# Patient Record
Sex: Female | Born: 1940 | Race: White | Hispanic: No | Marital: Married | Smoking: Never smoker
Health system: Southern US, Community
[De-identification: ages and names within clinical notes are randomized; demographics above are authoritative.]

## PROBLEM LIST (undated history)

## (undated) DIAGNOSIS — E785 Hyperlipidemia, unspecified: Secondary | ICD-10-CM

## (undated) DIAGNOSIS — M810 Age-related osteoporosis without current pathological fracture: Secondary | ICD-10-CM

## (undated) DIAGNOSIS — K219 Gastro-esophageal reflux disease without esophagitis: Secondary | ICD-10-CM

## (undated) DIAGNOSIS — N952 Postmenopausal atrophic vaginitis: Secondary | ICD-10-CM

## (undated) DIAGNOSIS — G238 Other specified degenerative diseases of basal ganglia: Secondary | ICD-10-CM

## (undated) DIAGNOSIS — G239 Degenerative disease of basal ganglia, unspecified: Principal | ICD-10-CM

## (undated) DIAGNOSIS — N3020 Other chronic cystitis without hematuria: Secondary | ICD-10-CM

## (undated) DIAGNOSIS — J42 Unspecified chronic bronchitis: Secondary | ICD-10-CM

## (undated) DIAGNOSIS — I2510 Atherosclerotic heart disease of native coronary artery without angina pectoris: Secondary | ICD-10-CM

## (undated) DIAGNOSIS — G250 Essential tremor: Secondary | ICD-10-CM

## (undated) DIAGNOSIS — Z8701 Personal history of pneumonia (recurrent): Secondary | ICD-10-CM

## (undated) DIAGNOSIS — K648 Other hemorrhoids: Secondary | ICD-10-CM

## (undated) DIAGNOSIS — N319 Neuromuscular dysfunction of bladder, unspecified: Secondary | ICD-10-CM

## (undated) DIAGNOSIS — F32A Depression, unspecified: Secondary | ICD-10-CM

## (undated) DIAGNOSIS — N1330 Unspecified hydronephrosis: Secondary | ICD-10-CM

## (undated) DIAGNOSIS — F329 Major depressive disorder, single episode, unspecified: Secondary | ICD-10-CM

## (undated) DIAGNOSIS — G903 Multi-system degeneration of the autonomic nervous system: Secondary | ICD-10-CM

## (undated) DIAGNOSIS — E039 Hypothyroidism, unspecified: Secondary | ICD-10-CM

## (undated) HISTORY — DX: Age-related osteoporosis without current pathological fracture: M81.0

## (undated) HISTORY — PX: LAPAROSCOPIC NISSEN FUNDOPLICATION: SHX1932

## (undated) HISTORY — DX: Other hemorrhoids: K64.8

## (undated) HISTORY — DX: Multi-system degeneration of the autonomic nervous system: G90.3

## (undated) HISTORY — DX: Hypothyroidism, unspecified: E03.9

## (undated) HISTORY — DX: Hyperlipidemia, unspecified: E78.5

## (undated) HISTORY — DX: Gastro-esophageal reflux disease without esophagitis: K21.9

## (undated) HISTORY — DX: Unspecified hydronephrosis: N13.30

## (undated) HISTORY — DX: Essential tremor: G25.0

## (undated) HISTORY — DX: Degenerative disease of basal ganglia, unspecified: G23.9

## (undated) HISTORY — DX: Other specified degenerative diseases of basal ganglia: G23.8

## (undated) HISTORY — DX: Personal history of pneumonia (recurrent): Z87.01

## (undated) HISTORY — DX: Atherosclerotic heart disease of native coronary artery without angina pectoris: I25.10

## (undated) HISTORY — DX: Postmenopausal atrophic vaginitis: N95.2

## (undated) HISTORY — DX: Neuromuscular dysfunction of bladder, unspecified: N31.9

## (undated) HISTORY — DX: Major depressive disorder, single episode, unspecified: F32.9

## (undated) HISTORY — PX: TUMOR REMOVAL: SHX12

## (undated) HISTORY — DX: Other chronic cystitis without hematuria: N30.20

## (undated) HISTORY — DX: Depression, unspecified: F32.A

## (undated) HISTORY — PX: TUBAL LIGATION: SHX77

## (undated) HISTORY — PX: CYSTECTOMY: SUR359

## (undated) HISTORY — DX: Unspecified chronic bronchitis: J42

---

## 1999-11-20 ENCOUNTER — Other Ambulatory Visit: Admission: RE | Admit: 1999-11-20 | Discharge: 1999-11-20 | Payer: Self-pay | Admitting: Internal Medicine

## 2000-04-20 ENCOUNTER — Ambulatory Visit (HOSPITAL_COMMUNITY): Admission: RE | Admit: 2000-04-20 | Discharge: 2000-04-20 | Payer: Self-pay | Admitting: Gastroenterology

## 2000-07-05 ENCOUNTER — Encounter: Admission: RE | Admit: 2000-07-05 | Discharge: 2000-07-05 | Payer: Self-pay | Admitting: Internal Medicine

## 2000-07-05 ENCOUNTER — Encounter: Payer: Self-pay | Admitting: Internal Medicine

## 2000-07-06 ENCOUNTER — Encounter: Payer: Self-pay | Admitting: General Surgery

## 2000-07-08 ENCOUNTER — Ambulatory Visit (HOSPITAL_COMMUNITY): Admission: RE | Admit: 2000-07-08 | Discharge: 2000-07-08 | Payer: Self-pay | Admitting: General Surgery

## 2000-08-12 ENCOUNTER — Observation Stay (HOSPITAL_COMMUNITY): Admission: RE | Admit: 2000-08-12 | Discharge: 2000-08-13 | Payer: Self-pay | Admitting: General Surgery

## 2001-06-28 ENCOUNTER — Encounter: Admission: RE | Admit: 2001-06-28 | Discharge: 2001-06-28 | Payer: Self-pay | Admitting: Internal Medicine

## 2001-06-28 ENCOUNTER — Encounter: Payer: Self-pay | Admitting: Internal Medicine

## 2002-01-11 ENCOUNTER — Other Ambulatory Visit: Admission: RE | Admit: 2002-01-11 | Discharge: 2002-01-11 | Payer: Self-pay | Admitting: Internal Medicine

## 2002-08-23 ENCOUNTER — Encounter: Admission: RE | Admit: 2002-08-23 | Discharge: 2002-08-23 | Payer: Self-pay

## 2002-08-23 ENCOUNTER — Encounter: Payer: Self-pay | Admitting: Internal Medicine

## 2003-02-14 ENCOUNTER — Other Ambulatory Visit: Admission: RE | Admit: 2003-02-14 | Discharge: 2003-02-14 | Payer: Self-pay | Admitting: Internal Medicine

## 2003-11-14 ENCOUNTER — Emergency Department (HOSPITAL_COMMUNITY): Admission: EM | Admit: 2003-11-14 | Discharge: 2003-11-14 | Payer: Self-pay | Admitting: *Deleted

## 2004-01-10 ENCOUNTER — Encounter: Admission: RE | Admit: 2004-01-10 | Discharge: 2004-01-10 | Payer: Self-pay | Admitting: Internal Medicine

## 2004-09-17 ENCOUNTER — Other Ambulatory Visit: Admission: RE | Admit: 2004-09-17 | Discharge: 2004-09-17 | Payer: Self-pay | Admitting: Internal Medicine

## 2004-09-17 ENCOUNTER — Ambulatory Visit: Payer: Self-pay | Admitting: Internal Medicine

## 2004-09-29 ENCOUNTER — Encounter: Admission: RE | Admit: 2004-09-29 | Discharge: 2004-09-29 | Payer: Self-pay | Admitting: Internal Medicine

## 2004-10-08 ENCOUNTER — Ambulatory Visit: Payer: Self-pay | Admitting: Internal Medicine

## 2004-12-30 ENCOUNTER — Ambulatory Visit: Payer: Self-pay | Admitting: Internal Medicine

## 2005-01-20 ENCOUNTER — Ambulatory Visit: Payer: Self-pay | Admitting: Internal Medicine

## 2005-02-10 ENCOUNTER — Ambulatory Visit: Payer: Self-pay | Admitting: Gastroenterology

## 2005-05-13 ENCOUNTER — Encounter: Admission: RE | Admit: 2005-05-13 | Discharge: 2005-05-13 | Payer: Self-pay | Admitting: Internal Medicine

## 2005-08-25 ENCOUNTER — Ambulatory Visit: Payer: Self-pay | Admitting: Internal Medicine

## 2005-12-30 ENCOUNTER — Ambulatory Visit: Payer: Self-pay | Admitting: Family Medicine

## 2006-02-08 ENCOUNTER — Encounter (INDEPENDENT_AMBULATORY_CARE_PROVIDER_SITE_OTHER): Payer: Self-pay | Admitting: *Deleted

## 2006-02-10 ENCOUNTER — Ambulatory Visit: Payer: Self-pay | Admitting: Internal Medicine

## 2006-02-17 ENCOUNTER — Other Ambulatory Visit: Admission: RE | Admit: 2006-02-17 | Discharge: 2006-02-17 | Payer: Self-pay | Admitting: Internal Medicine

## 2006-02-17 ENCOUNTER — Encounter: Payer: Self-pay | Admitting: Internal Medicine

## 2006-02-17 ENCOUNTER — Ambulatory Visit: Payer: Self-pay | Admitting: Internal Medicine

## 2006-03-31 ENCOUNTER — Ambulatory Visit: Payer: Self-pay | Admitting: Family Medicine

## 2006-06-08 ENCOUNTER — Ambulatory Visit: Payer: Self-pay | Admitting: Family Medicine

## 2006-06-08 ENCOUNTER — Encounter: Admission: RE | Admit: 2006-06-08 | Discharge: 2006-06-08 | Payer: Self-pay | Admitting: Internal Medicine

## 2007-01-04 ENCOUNTER — Ambulatory Visit: Payer: Self-pay | Admitting: Internal Medicine

## 2007-03-04 ENCOUNTER — Ambulatory Visit: Payer: Self-pay | Admitting: Internal Medicine

## 2007-03-04 LAB — CONVERTED CEMR LAB
ALT: 18 units/L (ref 0–40)
Albumin: 3.8 g/dL (ref 3.5–5.2)
Alkaline Phosphatase: 98 units/L (ref 39–117)
BUN: 14 mg/dL (ref 6–23)
Basophils Relative: 0.7 % (ref 0.0–1.0)
CO2: 32 meq/L (ref 19–32)
Glucose, Bld: 110 mg/dL — ABNORMAL HIGH (ref 70–99)
HDL: 44.9 mg/dL (ref >39.0)
LDL Cholesterol: 120 mg/dL — ABNORMAL HIGH (ref 0–99)
Lymphocytes Relative: 38.9 % (ref 12.0–46.0)
Monocytes Relative: 10 % (ref 3.0–11.0)
Neutro Abs: 2.7 10*3/uL (ref 1.4–7.7)
Platelets: 273 10*3/uL (ref 150–400)
Potassium: 4 meq/L (ref 3.5–5.1)
RBC: 4.48 M/uL (ref 3.87–5.11)
Total Bilirubin: 0.7 mg/dL (ref 0.3–1.2)
Total CHOL/HDL Ratio: 4.2
Total Protein: 6.3 g/dL (ref 6.0–8.3)
Triglycerides: 126 mg/dL (ref 0–149)
VLDL: 25 mg/dL (ref 0–40)

## 2007-03-21 ENCOUNTER — Ambulatory Visit: Payer: Self-pay

## 2007-04-06 ENCOUNTER — Ambulatory Visit: Payer: Self-pay | Admitting: Cardiology

## 2007-04-07 ENCOUNTER — Ambulatory Visit: Payer: Self-pay | Admitting: Cardiology

## 2007-04-07 LAB — CONVERTED CEMR LAB
Basophils Relative: 0.5 % (ref 0.0–1.0)
CO2: 29 meq/L (ref 19–32)
Eosinophils Relative: 2.2 % (ref 0.0–5.0)
GFR calc Af Amer: 205 mL/min
Glucose, Bld: 115 mg/dL — ABNORMAL HIGH (ref 70–99)
Hemoglobin: 12.9 g/dL (ref 12.0–15.0)
Lymphocytes Relative: 34.7 % (ref 12.0–46.0)
Monocytes Absolute: 0.5 10*3/uL (ref 0.2–0.7)
Neutro Abs: 3 10*3/uL (ref 1.4–7.7)
Neutrophils Relative %: 52.9 % (ref 43.0–77.0)
Potassium: 3.9 meq/L (ref 3.5–5.1)
Prothrombin Time: 11.6 s (ref 10.0–14.0)
Sodium: 143 meq/L (ref 135–145)
WBC: 5.5 10*3/uL (ref 4.5–10.5)

## 2007-04-08 ENCOUNTER — Ambulatory Visit: Payer: Self-pay | Admitting: Cardiology

## 2007-04-08 ENCOUNTER — Inpatient Hospital Stay (HOSPITAL_BASED_OUTPATIENT_CLINIC_OR_DEPARTMENT_OTHER): Admission: RE | Admit: 2007-04-08 | Discharge: 2007-04-08 | Payer: Self-pay | Admitting: Cardiology

## 2007-04-12 ENCOUNTER — Ambulatory Visit: Payer: Self-pay | Admitting: Cardiology

## 2007-04-21 ENCOUNTER — Ambulatory Visit: Payer: Self-pay | Admitting: Internal Medicine

## 2007-06-02 ENCOUNTER — Ambulatory Visit: Payer: Self-pay | Admitting: Cardiology

## 2007-06-08 ENCOUNTER — Ambulatory Visit: Payer: Self-pay | Admitting: Cardiology

## 2007-06-08 LAB — CONVERTED CEMR LAB
ALT: 20 units/L (ref 0–35)
Alkaline Phosphatase: 86 units/L (ref 39–117)
Bilirubin, Direct: 0.1 mg/dL (ref 0.0–0.3)
HDL: 35.2 mg/dL — ABNORMAL LOW (ref >39.0)
LDL Cholesterol: 45 mg/dL (ref 0–99)
VLDL: 11 mg/dL (ref 0–40)

## 2007-07-13 ENCOUNTER — Ambulatory Visit: Payer: Self-pay | Admitting: Cardiology

## 2007-07-19 ENCOUNTER — Encounter: Admission: RE | Admit: 2007-07-19 | Discharge: 2007-07-19 | Payer: Self-pay | Admitting: Internal Medicine

## 2007-09-08 ENCOUNTER — Encounter: Payer: Self-pay | Admitting: Internal Medicine

## 2007-09-08 ENCOUNTER — Ambulatory Visit: Payer: Self-pay | Admitting: Internal Medicine

## 2007-11-07 ENCOUNTER — Encounter (INDEPENDENT_AMBULATORY_CARE_PROVIDER_SITE_OTHER): Payer: Self-pay | Admitting: *Deleted

## 2007-11-07 DIAGNOSIS — K219 Gastro-esophageal reflux disease without esophagitis: Secondary | ICD-10-CM

## 2007-11-07 DIAGNOSIS — R251 Tremor, unspecified: Secondary | ICD-10-CM

## 2007-11-07 DIAGNOSIS — J42 Unspecified chronic bronchitis: Secondary | ICD-10-CM | POA: Insufficient documentation

## 2007-11-07 DIAGNOSIS — N952 Postmenopausal atrophic vaginitis: Secondary | ICD-10-CM

## 2007-11-07 DIAGNOSIS — J189 Pneumonia, unspecified organism: Secondary | ICD-10-CM

## 2007-11-07 DIAGNOSIS — N3289 Other specified disorders of bladder: Secondary | ICD-10-CM | POA: Insufficient documentation

## 2007-11-07 DIAGNOSIS — F329 Major depressive disorder, single episode, unspecified: Secondary | ICD-10-CM

## 2007-11-07 DIAGNOSIS — G969 Disorder of central nervous system, unspecified: Secondary | ICD-10-CM

## 2007-11-07 HISTORY — DX: Postmenopausal atrophic vaginitis: N95.2

## 2007-11-15 ENCOUNTER — Ambulatory Visit: Payer: Self-pay | Admitting: Cardiology

## 2008-01-31 ENCOUNTER — Ambulatory Visit: Payer: Self-pay | Admitting: Internal Medicine

## 2008-01-31 DIAGNOSIS — D485 Neoplasm of uncertain behavior of skin: Secondary | ICD-10-CM | POA: Insufficient documentation

## 2008-02-13 ENCOUNTER — Encounter: Payer: Self-pay | Admitting: Internal Medicine

## 2008-02-14 ENCOUNTER — Ambulatory Visit: Payer: Self-pay | Admitting: Internal Medicine

## 2008-02-14 DIAGNOSIS — I8390 Asymptomatic varicose veins of unspecified lower extremity: Secondary | ICD-10-CM | POA: Insufficient documentation

## 2008-02-22 ENCOUNTER — Ambulatory Visit: Payer: Self-pay | Admitting: Gastroenterology

## 2008-02-28 ENCOUNTER — Encounter: Payer: Self-pay | Admitting: Internal Medicine

## 2008-03-09 ENCOUNTER — Ambulatory Visit (HOSPITAL_BASED_OUTPATIENT_CLINIC_OR_DEPARTMENT_OTHER): Admission: RE | Admit: 2008-03-09 | Discharge: 2008-03-09 | Payer: Self-pay | Admitting: Urology

## 2008-03-10 ENCOUNTER — Emergency Department (HOSPITAL_COMMUNITY): Admission: EM | Admit: 2008-03-10 | Discharge: 2008-03-10 | Payer: Self-pay | Admitting: Emergency Medicine

## 2008-03-15 ENCOUNTER — Ambulatory Visit: Payer: Self-pay | Admitting: Internal Medicine

## 2008-04-10 ENCOUNTER — Encounter: Payer: Self-pay | Admitting: Internal Medicine

## 2008-05-10 ENCOUNTER — Ambulatory Visit: Payer: Self-pay | Admitting: Internal Medicine

## 2008-05-10 LAB — CONVERTED CEMR LAB
Basophils Absolute: 0 10*3/uL (ref 0.0–0.1)
Bilirubin Urine: NEGATIVE
Bilirubin, Direct: 0.1 mg/dL (ref 0.0–0.3)
Calcium: 9.6 mg/dL (ref 8.4–10.5)
Eosinophils Absolute: 0.1 10*3/uL (ref 0.0–0.7)
GFR calc Af Amer: 107 mL/min
HCT: 38 % (ref 36.0–46.0)
HDL: 49.9 mg/dL (ref >39.0)
Hemoglobin: 13 g/dL (ref 12.0–15.0)
Ketones, ur: NEGATIVE mg/dL
Leukocytes, UA: NEGATIVE
Lymphocytes Relative: 29.8 % (ref 12.0–46.0)
MCHC: 34.3 g/dL (ref 30.0–36.0)
MCV: 90.1 fL (ref 78.0–100.0)
Monocytes Absolute: 0.5 10*3/uL (ref 0.1–1.0)
Neutro Abs: 2.8 10*3/uL (ref 1.4–7.7)
RDW: 12.2 % (ref 11.5–14.6)
Sodium: 146 meq/L — ABNORMAL HIGH (ref 135–145)
TSH: 0.44 microintl units/mL (ref 0.35–5.50)
Total Bilirubin: 0.6 mg/dL (ref 0.3–1.2)
Total CHOL/HDL Ratio: 2.3
Triglycerides: 56 mg/dL (ref 0–149)
pH: 7 (ref 5.0–8.0)

## 2008-05-15 ENCOUNTER — Other Ambulatory Visit: Admission: RE | Admit: 2008-05-15 | Discharge: 2008-05-15 | Payer: Self-pay | Admitting: Internal Medicine

## 2008-05-15 ENCOUNTER — Ambulatory Visit: Payer: Self-pay | Admitting: Internal Medicine

## 2008-05-15 ENCOUNTER — Encounter: Payer: Self-pay | Admitting: Internal Medicine

## 2008-05-15 DIAGNOSIS — E039 Hypothyroidism, unspecified: Secondary | ICD-10-CM

## 2008-05-15 DIAGNOSIS — J45909 Unspecified asthma, uncomplicated: Secondary | ICD-10-CM | POA: Insufficient documentation

## 2008-05-17 ENCOUNTER — Ambulatory Visit: Payer: Self-pay | Admitting: Cardiology

## 2008-05-18 ENCOUNTER — Telehealth: Payer: Self-pay | Admitting: Internal Medicine

## 2008-05-22 ENCOUNTER — Encounter: Payer: Self-pay | Admitting: Internal Medicine

## 2008-05-24 ENCOUNTER — Ambulatory Visit: Payer: Self-pay | Admitting: Internal Medicine

## 2008-06-01 ENCOUNTER — Ambulatory Visit: Payer: Self-pay | Admitting: Internal Medicine

## 2008-06-04 ENCOUNTER — Ambulatory Visit: Payer: Self-pay | Admitting: Internal Medicine

## 2008-06-04 DIAGNOSIS — R0602 Shortness of breath: Secondary | ICD-10-CM | POA: Insufficient documentation

## 2008-06-09 DIAGNOSIS — I2510 Atherosclerotic heart disease of native coronary artery without angina pectoris: Secondary | ICD-10-CM | POA: Insufficient documentation

## 2008-07-05 ENCOUNTER — Encounter: Payer: Self-pay | Admitting: Internal Medicine

## 2008-07-09 ENCOUNTER — Ambulatory Visit: Payer: Self-pay | Admitting: Internal Medicine

## 2008-08-08 ENCOUNTER — Ambulatory Visit: Payer: Self-pay | Admitting: Internal Medicine

## 2008-11-20 ENCOUNTER — Telehealth: Payer: Self-pay | Admitting: Internal Medicine

## 2008-11-20 ENCOUNTER — Ambulatory Visit: Payer: Self-pay | Admitting: Cardiology

## 2009-01-29 ENCOUNTER — Ambulatory Visit: Payer: Self-pay | Admitting: Internal Medicine

## 2009-03-08 ENCOUNTER — Ambulatory Visit: Payer: Self-pay | Admitting: Internal Medicine

## 2009-04-11 ENCOUNTER — Ambulatory Visit: Payer: Self-pay | Admitting: Internal Medicine

## 2009-05-29 ENCOUNTER — Encounter: Payer: Self-pay | Admitting: Cardiology

## 2009-06-04 ENCOUNTER — Ambulatory Visit: Payer: Self-pay | Admitting: Internal Medicine

## 2009-06-06 ENCOUNTER — Encounter: Admission: RE | Admit: 2009-06-06 | Discharge: 2009-06-06 | Payer: Self-pay | Admitting: Internal Medicine

## 2009-06-06 LAB — CONVERTED CEMR LAB
ALT: 16 units/L (ref 0–35)
AST: 23 units/L (ref 0–37)
BUN: 15 mg/dL (ref 6–23)
Basophils Absolute: 0.3 10*3/uL — ABNORMAL HIGH (ref 0.0–0.1)
Bilirubin, Direct: 0.1 mg/dL (ref 0.0–0.3)
Calcium: 9.2 mg/dL (ref 8.4–10.5)
Cholesterol: 112 mg/dL (ref 0–200)
Eosinophils Relative: 1.3 % (ref 0.0–5.0)
GFR calc non Af Amer: 65.97 mL/min (ref >60)
HCT: 41.2 % (ref 36.0–46.0)
HDL: 48.3 mg/dL (ref >39.00)
Lymphocytes Relative: 32.3 % (ref 12.0–46.0)
Lymphs Abs: 1.5 10*3/uL (ref 0.7–4.0)
Monocytes Relative: 8.8 % (ref 3.0–12.0)
Platelets: 224 10*3/uL (ref 150.0–400.0)
Potassium: 4.6 meq/L (ref 3.5–5.1)
RDW: 12.6 % (ref 11.5–14.6)
Sodium: 141 meq/L (ref 135–145)
TSH: 0.47 microintl units/mL (ref 0.35–5.50)
Total Protein: 6.7 g/dL (ref 6.0–8.3)
VLDL: 14.4 mg/dL (ref 0.0–40.0)
WBC: 4.6 10*3/uL (ref 4.5–10.5)

## 2009-06-07 ENCOUNTER — Telehealth: Payer: Self-pay | Admitting: Internal Medicine

## 2009-06-10 ENCOUNTER — Telehealth: Payer: Self-pay | Admitting: Internal Medicine

## 2009-06-10 ENCOUNTER — Ambulatory Visit: Payer: Self-pay | Admitting: Internal Medicine

## 2009-06-21 ENCOUNTER — Ambulatory Visit: Payer: Self-pay | Admitting: Internal Medicine

## 2009-06-21 DIAGNOSIS — I872 Venous insufficiency (chronic) (peripheral): Secondary | ICD-10-CM | POA: Insufficient documentation

## 2009-07-08 ENCOUNTER — Telehealth: Payer: Self-pay | Admitting: Internal Medicine

## 2009-08-15 ENCOUNTER — Ambulatory Visit: Payer: Self-pay | Admitting: Internal Medicine

## 2010-01-20 ENCOUNTER — Telehealth: Payer: Self-pay | Admitting: Internal Medicine

## 2010-01-27 ENCOUNTER — Ambulatory Visit: Payer: Self-pay | Admitting: Internal Medicine

## 2010-02-06 ENCOUNTER — Telehealth: Payer: Self-pay | Admitting: Internal Medicine

## 2010-02-18 ENCOUNTER — Encounter (INDEPENDENT_AMBULATORY_CARE_PROVIDER_SITE_OTHER): Payer: Self-pay | Admitting: *Deleted

## 2010-04-08 ENCOUNTER — Encounter: Payer: Self-pay | Admitting: Cardiology

## 2010-04-10 ENCOUNTER — Encounter: Payer: Self-pay | Admitting: Internal Medicine

## 2010-04-22 ENCOUNTER — Encounter: Payer: Self-pay | Admitting: Cardiology

## 2010-04-22 ENCOUNTER — Telehealth: Payer: Self-pay | Admitting: Cardiology

## 2010-05-05 ENCOUNTER — Ambulatory Visit: Payer: Self-pay | Admitting: Cardiology

## 2010-05-06 LAB — CONVERTED CEMR LAB: Total CK: 65 units/L (ref 7–177)

## 2010-05-14 ENCOUNTER — Ambulatory Visit: Payer: Self-pay | Admitting: Cardiology

## 2010-05-14 ENCOUNTER — Ambulatory Visit: Payer: Self-pay | Admitting: Internal Medicine

## 2010-05-14 DIAGNOSIS — E7800 Pure hypercholesterolemia, unspecified: Secondary | ICD-10-CM | POA: Insufficient documentation

## 2010-05-14 DIAGNOSIS — J40 Bronchitis, not specified as acute or chronic: Secondary | ICD-10-CM

## 2010-05-15 ENCOUNTER — Telehealth: Payer: Self-pay | Admitting: Internal Medicine

## 2010-06-09 ENCOUNTER — Ambulatory Visit: Payer: Self-pay | Admitting: Internal Medicine

## 2010-06-09 DIAGNOSIS — R5381 Other malaise: Secondary | ICD-10-CM | POA: Insufficient documentation

## 2010-06-09 DIAGNOSIS — R5383 Other fatigue: Secondary | ICD-10-CM

## 2010-06-09 LAB — CONVERTED CEMR LAB
Basophils Absolute: 0 10*3/uL (ref 0.0–0.1)
Bilirubin, Direct: 0.1 mg/dL (ref 0.0–0.3)
CO2: 31 meq/L (ref 19–32)
Calcium: 9.3 mg/dL (ref 8.4–10.5)
Cholesterol: 233 mg/dL — ABNORMAL HIGH (ref 0–200)
Creatinine, Ser: 0.7 mg/dL (ref 0.4–1.2)
Direct LDL: 161.5 mg/dL
Eosinophils Absolute: 0.1 10*3/uL (ref 0.0–0.7)
HCT: 39.5 % (ref 36.0–46.0)
HDL: 51.1 mg/dL (ref >39.00)
Hemoglobin: 13.5 g/dL (ref 12.0–15.0)
Lymphs Abs: 1.8 10*3/uL (ref 0.7–4.0)
MCHC: 34.1 g/dL (ref 30.0–36.0)
MCV: 89.8 fL (ref 78.0–100.0)
Monocytes Relative: 9 % (ref 3.0–12.0)
Neutro Abs: 3.1 10*3/uL (ref 1.4–7.7)
RDW: 13.8 % (ref 11.5–14.6)
Triglycerides: 201 mg/dL — ABNORMAL HIGH (ref 0.0–149.0)
VLDL: 40.2 mg/dL — ABNORMAL HIGH (ref 0.0–40.0)

## 2010-06-13 ENCOUNTER — Encounter: Admission: RE | Admit: 2010-06-13 | Discharge: 2010-06-13 | Payer: Self-pay | Admitting: Internal Medicine

## 2010-07-21 ENCOUNTER — Ambulatory Visit: Payer: Self-pay | Admitting: Cardiology

## 2010-07-28 ENCOUNTER — Telehealth: Payer: Self-pay | Admitting: Cardiology

## 2010-08-08 ENCOUNTER — Telehealth: Payer: Self-pay | Admitting: Internal Medicine

## 2010-08-21 ENCOUNTER — Encounter: Admission: RE | Admit: 2010-08-21 | Discharge: 2010-08-21 | Payer: Self-pay | Admitting: Neurology

## 2010-09-05 ENCOUNTER — Encounter: Admission: RE | Admit: 2010-09-05 | Discharge: 2010-09-05 | Payer: Self-pay | Admitting: Neurology

## 2010-10-09 ENCOUNTER — Telehealth: Payer: Self-pay | Admitting: Internal Medicine

## 2010-10-09 ENCOUNTER — Encounter: Payer: Self-pay | Admitting: Internal Medicine

## 2010-10-16 ENCOUNTER — Encounter
Admission: RE | Admit: 2010-10-16 | Discharge: 2010-10-16 | Payer: Self-pay | Source: Home / Self Care | Attending: Neurology | Admitting: Neurology

## 2010-10-24 ENCOUNTER — Encounter (INDEPENDENT_AMBULATORY_CARE_PROVIDER_SITE_OTHER): Payer: Self-pay | Admitting: *Deleted

## 2010-10-30 ENCOUNTER — Other Ambulatory Visit
Admission: RE | Admit: 2010-10-30 | Discharge: 2010-10-30 | Payer: Self-pay | Source: Home / Self Care | Admitting: Radiology

## 2010-10-30 ENCOUNTER — Encounter
Admission: RE | Admit: 2010-10-30 | Discharge: 2010-10-30 | Payer: Self-pay | Source: Home / Self Care | Attending: Neurology | Admitting: Neurology

## 2010-12-09 NOTE — Assessment & Plan Note (Addendum)
Summary: F1Y   Visit Type:  1 year follow up Referring Provider:  Norins Primary Provider:  Norins  CC:  Bilateral leg pain- Crestor on hold.  History of Present Illness: [name] is in for a follow up visit, and she has seen Dr. Erling Cruz.  She has had a worsening of her tremors, and she noted this. She also had some proximal muscle weaknress, and had EMG/NCV done.  Dr. Erling Cruz called.   He thought it could be related to her statin, and we called her to recommend that this be discontinued.  She has placed Crestor on hold.  CPK was obtained, and was normal.  She has known CAD, with prior recommendation of guideline driven care regarding secondary prevention of acute coronary events.  She has been stable from a cardiac standpoint.  She has yet to notice much difference with discontinuation of her Crestor.  She does have a URI, and has started to get head congestion, and some asthmatic symptoms.  She has perhaps some aching in her legs, but mostly it is the tremor which is worse.   Current Medications (verified): 1)  Clonazepam 0.5 Mg Tabs (Clonazepam) .... Take 1-2  By Mouth Daily 2)  Propranolol Hcl 10 Mg Tabs (Propranolol Hcl) .... Take 1 Tablet By Mouth Three Times A Day 3)  Synthroid 100 Mcg Tabs (Levothyroxine Sodium) .... Take 1 Tablet By Mouth Once A Day 4)  Isosorbide Mononitrate Cr 30 Mg  Tb24 (Isosorbide Mononitrate) .... Take 1/2 Tablet Once Daily 5)  Crestor 20 Mg  Tabs (Rosuvastatin Calcium) .... Take One Tablet Once Daily--On Hold 6)  Nitroquick 0.4 Mg  Subl (Nitroglycerin) .... Take Sl As Needed and As Directed. 7)  Nasonex 50 Mcg/act  Susp (Mometasone Furoate) .... Take As Needed and As Directed. 8)  Trimethoprim 100 Mg  Tabs (Trimethoprim) .... Take One Tablet Once Daily 9)  Qvar 80 Mcg/act Aers (Beclomethasone Dipropionate) .... 2 Puffs and Rinse, Twice Daily 10)  Proair Hfa 108 (90 Base) Mcg/act Aers (Albuterol Sulfate) .... 2 Puffs Four Times Per Day As Needed For Shortness of  Breath 11)  Sertraline Hcl 25 Mg Tabs (Sertraline Hcl) .Marland Kitchen.. 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   Vital Signs:  Patient profile:   70 year old female Height:      62 inches Weight:      147.50 pounds BMI:     27.08 Pulse rate:   68 / minute Pulse rhythm:   regular Resp:     18 per minute BP sitting:   124 / 70  (left arm) Cuff size:   large  Vitals Entered By: Sidney Ace (May 14, 2010 2:36 PM)  Physical Exam  General:  Well developed, well nourished, in no acute distress. Head:  normocephalic and atraumatic Eyes:  PERRLA/EOM intact; conjunctiva and lids normal. Lungs:  Clear bilaterally to auscultation and percussion.  Minimal wheezes Heart:  Non-displaced PMI, chest non-tender; regular rate and rhythm, S1, S2 without murmurs, rubs or gallops. Carotid upstroke normal, no bruit. Normal abdominal aortic size, no bruits.  Pulses:  pulses normal in all 4 extremities Extremities:  No  clubbing or cyanosis. Neurologic:  Alert and oriented x 3.  Known tremor, notable on observation.   Cardiac Cath  Procedure date:  04/08/2007  Findings:       CONCLUSION:   1. Well-preserved left ventricular function.   2. A 30% narrowing of proximal left anterior descending artery and 60-       70% narrowing of the mid left anterior descending artery.   3. Calcification of the right coronary ostium without critical       narrowing in the small nondominant vessel.      DISPOSITION:  The patient does not have a significant perfusion defect   by myocardial perfusion imaging.  She does in fact have mild ST   depression with exercise.  We will attempt to begin an initial program   of medical therapy, and then subsequent to that defer percutaneous   intervention unless she has further symptoms.  Importantly, the LAD does   involve two diagonal branches which are very small in caliber, and the   LAD may or may not be big enough for a drug-eluting stent.  These will   need to be considered.      EKG  Procedure date:  05/14/2010  Findings:      NSR.  WNL  Impression & Recommendations:  Problem # 1:  CORONARY HEART DISEASE (ICD-414.00) Continues to remain stable on a medical regimen.  See cath report.  Goal has been to continue on this path.  Patient and husband have been well educated on signs of progressive symptoms, and also on Courage strategy.  Of note, propranolol prescription is related to her tremor by neurology. The following medications were removed from the medication list:    Aspirin 81 Mg Tabs (Aspirin) .Marland Kitchen... Take one tablet every 3-4 days. Her updated medication list for this problem includes:    Propranolol Hcl 10 Mg Tabs (Propranolol hcl) .Marland Kitchen... Take 1 tablet by mouth three times a day    Isosorbide Mononitrate Cr 30 Mg Tb24 (Isosorbide mononitrate) .Marland Kitchen... Take 1/2 tablet once daily    Nitroquick 0.4 Mg Subl (Nitroglycerin) .Marland Kitchen... Take sl as needed and as  directed.  Orders: EKG w/ Interpretation (93000)  Problem # 2:  HYPERCHOLESTEROLEMIA  IIA (ICD-272.0) We have placed this medication on hold, at the request of neurology, and will see her path as this remains on hold relative to her symptoms.  Notably, it is taken predominately for secondary prevention, and has been monitored by Dr. Linda Hedges.  Will have her back in two months to assess. Her updated medication list for this problem includes:    Crestor 20 Mg Tabs (Rosuvastatin calcium) .Marland Kitchen... Take one tablet once daily--on hold  Patient Instructions: 1)  Your physician recommends that you schedule a follow-up appointment in: 2 MONTHS 2)  Your physician recommends that you continue on your current medications as directed. Please refer to the Current Medication list given to you today.

## 2010-12-09 NOTE — Progress Notes (Signed)
Summary: referral request  Phone Note Call from Patient Call back at Home Phone [phone]   Summary of Call: Patient left message on triage that she was previously seen by Dr. Erling Cruz and she tried to get an appt with them. Patient has not been seen there in 3+ yrs and requires referral. Please enter referral, patient c/o worsening tremors. Please advise. Initial call taken by: Ernestene Mention,  February 06, 2010 3:08 PM  Follow-up for Phone Call        [facility] notified Follow-up by: Neena Rhymes MD,  February 06, 2010 3:56 PM  Additional Follow-up for Phone Call Additional follow up Details #1::        [facility] to notify/ closing phone note Additional Follow-up by: Estell Harpin CMA,  February 06, 2010 4:32 PM

## 2010-12-09 NOTE — Progress Notes (Signed)
Summary: clonazepam  Phone Note Refill Request Message from:  Fax from Pharmacy on August 08, 2010 10:30 AM  Refills Requested: Medication #1:  CLONAZEPAM 0.5 MG TABS Take 1-2  by mouth daily # 60   Last [facility] [address] [phone] Last ov 06/09/10 for CPX Is this ok to refill?  Next Appointment Scheduled: none Initial call taken by: Tomma Lightning RMA,  August 08, 2010 10:31 AM  Follow-up for Phone Call        ok to refill as needed  Follow-up by: Neena Rhymes MD,  August 08, 2010 1:01 PM  Additional Follow-up for Phone Call Additional follow up Details #1::        Faxed back [facility] @ [phone]. Updated EMR Additional Follow-up by: Tomma Lightning RMA,  August 08, 2010 1:42 PM    Prescriptions: CLONAZEPAM 0.5 MG TABS (CLONAZEPAM) Take 1-2  by mouth daily  #60 x 1   Entered by:   Tomma Lightning RMA   Authorized by:   Neena Rhymes MD   Signed by:   Tomma Lightning RMA on 08/08/2010   Method used:   Historical   RxID:   [id number]

## 2010-12-09 NOTE — Letter (Signed)
Summary: [facility] Consult Scheduled Letter  [facility]   [place], [place] [address]   Phone: [phone]  Fax: [phone]      02/18/2010 MRN: [id number]  [address] [place], [place]  [address]    Dear Ms. Bogue,      We have scheduled an appointment for you.  At the recommendation of Dr.Norins, we have scheduled you a consult with Dr Erling Cruz on 03/18/10 at 10:30am.  Their phone number is [phone].  If this appointment day and time is not convenient for you, please feel free to call the office of the doctor you are being referred to at the number listed above and reschedule the appointment.     [facility] [place], [place] [address]   *Please arrive 30 minutes prior to appointment.*    Thank you,  Patient Care Coordinator [facility]

## 2010-12-09 NOTE — Progress Notes (Signed)
  Phone Note Refill Request Message from:  Fax from Pharmacy on January 20, 2010 1:47 PM  Refills Requested: Medication #1:  CLONAZEPAM 0.5 MG TABS Take 1-2  by mouth daily received [facility] [address] [facility] [place], please Advise refill  Initial call taken by: [place],  January 20, 2010 1:48 PM    Prescriptions: CLONAZEPAM 0.5 MG TABS (CLONAZEPAM) Take 1-2  by mouth daily  #60 x 1   Entered and Authorized by:   Biagio Borg MD   Signed by:   Biagio Borg MD on 01/20/2010   Method used:   Print then Give to Patient   RxID:   [id number]  done hardcopy to [facility] side B - dahlia Biagio Borg MD  January 20, 2010 2:35 PM  rx faxed to pharmacy [facility], [place]  January 20, 2010 2:56 PM

## 2010-12-09 NOTE — Progress Notes (Signed)
Summary: Refill-Clonazepam  Phone Note Refill Request Message from:  Fax from Pharmacy on May 15, 2010 9:16 AM  Refills Requested: Medication #1:  CLONAZEPAM 0.5 MG TABS Take 1-2  by mouth daily Next Appointment Scheduled: 06-09-10 Initial call taken by: Ernestene Mention,  May 15, 2010 9:16 AM  Follow-up for Phone Call        ok to fill as before - thanks Follow-up by: Rowe Clack MD,  May 15, 2010 12:23 PM    Prescriptions: CLONAZEPAM 0.5 MG TABS (CLONAZEPAM) Take 1-2  by mouth daily  #60 x 1   Entered by:   Ernestene Mention   Authorized by:   Rowe Clack MD   Signed by:   Ernestene Mention on 05/15/2010   Method used:   Telephoned to ...       [facility]  [place] (retail)       [address]       [place], [place]  [address]       Ph: [phone] or [phone]       Fax: [phone]   RxID:   [id number]

## 2010-12-09 NOTE — Assessment & Plan Note (Signed)
Summary: shingles shot-lb  Nurse Visit   Allergies: No Known Drug Allergies  Immunizations Administered:  Zostavax # 1:    Vaccine Type: Zostavax    Site: L arm    Mfr: Merck    Dose: 0.5 ml    Route: [place]    Given by: Charlsie Quest, CMA Administered by Felipa Evener SMA    Exp. Date: 12/06/2010    Lot #: [id number]    VIS given: 08/21/05 given January 27, 2010.  Orders Added: 1)  Zoster (Shingles) Vaccine Live [29191] 2)  Admin 1st Vaccine [phone]

## 2010-12-09 NOTE — Progress Notes (Signed)
Summary: pt needs letter sent to Dr. Erling Cruz  Phone Note Call from Patient Call back at Home Phone [phone]   Caller: Patient Reason for Call: Talk to Nurse, Talk to Doctor Summary of Call: pt needs a letter sent over to Dr. Erling Cruz so she can get a sooner appt because she was told she could not get in until Jan. plz fax to [phone] Attn : Estill Bamberg she works with Dr. Erling Cruz. Initial call taken by: Shelda Pal,  July 28, 2010 11:29 AM  Follow-up for Phone Call        The pt said Dr Lia Foyer would like her to follow-up with Dr Erling Cruz about tremors and discuss Crestor. The pt would like her OV note from last week faxed to Dr Erling Cruz to attempt to arrange an earlier appt.  Theodosia Quay, RN, BSN  July 28, 2010 7:52 PM  Additional Follow-up for Phone Call Additional follow up Details #1::        I faxed copy of office note to Dr. Erling Cruz with attention to [facility].   Additional Follow-up by: Wadie Lessen, MD, [facility],  August 01, 2010 5:17 AM

## 2010-12-09 NOTE — Consult Note (Signed)
Summary: [facility]  [facility]   Imported By: Phillis Knack 10/14/2010 15:05:43  _____________________________________________________________________  External Attachment:    Type:   Image     Comment:   External Document

## 2010-12-09 NOTE — Progress Notes (Signed)
  Phone Note Refill Request Message from:  Fax from Pharmacy on October 09, 2010 2:57 PM  Refills Requested: Medication #1:  CLONAZEPAM 0.5 MG TABS Take 1-2  by mouth daily   Last Refilled: 09/05/2010   Is this ok  Initial call taken by: Estell Harpin CMA,  October 09, 2010 2:58 PM  Follow-up for Phone Call        ok for refill x 5 Follow-up by: Neena Rhymes MD,  October 09, 2010 3:51 PM    Prescriptions: CLONAZEPAM 0.5 MG TABS (CLONAZEPAM) Take 1-2  by mouth daily  #60 x 5   Entered by:   Ami Bullins CMA   Authorized by:   Neena Rhymes MD   Signed by:   Charlynne Cousins CMA on 10/09/2010   Method used:   Telephoned to ...       [facility]  [place] (retail)       [address]       [place], [place]  [address]       Ph: [phone] or [phone]       Fax: [phone]   RxID:   [id number]

## 2010-12-09 NOTE — Progress Notes (Signed)
Summary: DR LOVE CALLED  Phone Note From Other Clinic   Caller: DR LOVE Call For: DR Lia Foyer Summary of Call: Dr Erling Cruz called our office because Dr Linda Hedges referred this pt to him for evaluation of tremors.  The pt has proximal weakness in her legs and arms.  Dr Erling Cruz is concerned because the pt is falling.  Dr Erling Cruz has done nerve conduction testing and EMG.  These tests were normal.  Dr Erling Cruz feels like the pt's Crestor could be causing symptoms of weakness and would like Dr Lia Foyer to decide if the pt can hold Crestor until her appt on 05/14/10 with Dr Lia Foyer.  I reviewed the pt's chart and her last labs were checked in July 2010. I will forward this information to Dr Lia Foyer for review.  Initial call taken by: Theodosia Quay, RN, BSN,  April 22, 2010 1:05 PM  Follow-up for Phone Call        Dr Lia Foyer called the pt and recommended having the pt hold Crestor.  Dr Lia Foyer also ordered a CPK to be drawn on 05/05/10. Theodosia Quay, RN, BSN  April 29, 2010 4:11 PM    New/Updated Medications: CRESTOR 20 MG  TABS (ROSUVASTATIN CALCIUM) Take one tablet once daily--ON HOLD

## 2010-12-09 NOTE — Assessment & Plan Note (Signed)
Summary: f1m  Visit Type:  2 mo f/u Referring Lloyd Cullinan:  MAdella HarePrimary Cayce Quezada:  MNeena RhymesMD  CC:  pt states she has been off her crestor for the last 2-3 months as per Dr. SLia Foyerto see if her leg weakness was caused by the crestor..pt states still has the weakness and is seeing a neurologist....denies any cardiac complaints today.  History of Present Illness: Patient has been off Crestor and there has been no change whatsoever.  Patient has not been to see Dr. LErling Cruz The right leg has worsened.  The right is stiff, and she has to drag it.  Arms are about the same.  CPK was normal in 04/2010.  No chest pain, and no cardiac symptoms.  Current Medications (verified): 1)  Clonazepam 0.5 Mg Tabs (Clonazepam) .... Take 1-2  By Mouth Daily 2)  Propranolol Hcl 10 Mg Tabs (Propranolol Hcl) .... Take 1 Tablet By Mouth Three Times A Day 3)  Synthroid 100 Mcg Tabs (Levothyroxine Sodium) .... Take 1 Tablet By Mouth Once A Day 4)  Isosorbide Mononitrate Cr 30 Mg  Tb24 (Isosorbide Mononitrate) .... Take 1/2 Tablet Once Daily 5)  Crestor 20 Mg  Tabs (Rosuvastatin Calcium) .... Take One Tablet Once Daily--On Hold 6)  Nitrostat 0.4 Mg Subl (Nitroglycerin) ..Marland Kitchen. 1 Tablet Under Tongue At Onset of Chest Pain; You May Repeat Every 5 Minutes For Up To 3 Doses. 7)  Nasonex 50 Mcg/act  Susp (Mometasone Furoate) .... Take As Needed and As Directed. 8)  Trimethoprim 100 Mg  Tabs (Trimethoprim) .... Take One Tablet Once Daily 9)  Qvar 80 Mcg/act Aers (Beclomethasone Dipropionate) .... 2 Puffs and Rinse, Twice Daily 10)  Proair Hfa 108 (90 Base) Mcg/act Aers (Albuterol Sulfate) .... 2 Puffs Four Times Per Day As Needed For Shortness of Breath 11)  Sertraline Hcl 25 Mg Tabs (Sertraline Hcl) ..Marland Kitchen. 1 By Mouth Qam 12)  Calcium-Vitamin D 500-200 Mg-Unit Tabs (Calcium Carbonate-Vitamin D) ..Marland Kitchen. 1 Tab Once Daily 13)  Cyanocobalamin 1000 Mcg/ml Soln (Cyanocobalamin) ..Marland Kitchen. 1 Shot Every Week For 8 Weeks and Then  Once A Month  Allergies (verified): No Known Drug Allergies  Vital Signs:  Patient profile:   70year old female Height:      62 inches Weight:      147.8 pounds BMI:     27.13 Pulse rate:   66 / minute Pulse rhythm:   regular BP sitting:   100 / 68  (left arm) Cuff size:   large  Vitals Entered By: CJulaine Hua CMA (July 21, 2010 3:53 PM)  Physical Exam  General:  Well developed, well nourished, in no acute distress. Head:  normocephalic and atraumatic Eyes:  PERRLA/EOM intact; conjunctiva and lids normal. Lungs:  Clear bilaterally to auscultation and percussion. Heart:  NOrmal S1 and S2.  No murmur, rub, or gallop.  No diastolic murmurs.   Abdomen:  Bowel sounds positive; abdomen soft and non-tender without masses, organomegaly, or hernias noted. No hepatosplenomegaly. Msk:  Back normal, normal gait. Muscle strength and tone normal. Pulses:  pulses normal in all 4 extremities Extremities:  No clubbing or cyanosis. Neurologic:  Alert and oriented x 3.  Tremor unchanged.  Leg weakness.   Impression & Recommendations:  Problem # 1:  CORONARY HEART DISEASE (ICD-414.00) No real recurrence of symptoms at this point, and medical treatment program still recommended. Her updated medication list for this problem includes:    Propranolol Hcl 10 Mg Tabs (Propranolol hcl) ..Marland Kitchen.. Take 1  tablet by mouth three times a day    Isosorbide Mononitrate Cr 30 Mg Tb24 (Isosorbide mononitrate) .Marland Kitchen... Take 1/2 tablet once daily    Nitrostat 0.4 Mg Subl (Nitroglycerin) .Marland Kitchen... 1 tablet under tongue at onset of chest pain; you may repeat every 5 minutes for up to 3 doses.  Problem # 2:  HYPERCHOLESTEROLEMIA  IIA (ICD-272.0) Crestor remains on hold, and symptoms virtually unchanged.  Would be worthwhile to reassess with Dr. Erling Cruz at this point.   Has been on prolonged statin holiday, and no change in symptoms;  thus seems the two are not likely related.   Her updated medication list for this problem  includes:    Crestor 20 Mg Tabs (Rosuvastatin calcium) .Marland Kitchen... 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 Erling Cruz who has managed.  Also more recent weakness, which raises question of another concurrent issue.  Will suggest revisit with neuro to reassess, with continuing to hold statin at this time.    Patient Instructions: 1)  Your physician recommends that you schedule a follow-up appointment in: 6 months with Dr. Lia Foyer 2)  Your physician recommends that you continue on your current medications as directed. Please refer to the Current Medication list given to you today. Prescriptions: NITROSTAT 0.4 MG SUBL (NITROGLYCERIN) 1 tablet under tongue at onset of chest pain; you may repeat every 5 minutes for up to 3 doses.  #25 x 9   Entered by:   Julaine Hua, CMA   Authorized by:   Wadie Lessen, MD, [facility]   Signed by:   Julaine Hua, CMA on 07/21/2010   Method used:   Electronically [facility]        [facility]  [place] (retail)       [address]       [place], [place]  [address]       Ph: [phone] or [phone]       Fax: [phone]   RxID:   [id number]

## 2010-12-09 NOTE — Assessment & Plan Note (Signed)
Summary: COLD/ CHEST CONGESTION/ SEEING DR Lia Foyer AT 2:15 /NWS   Vital Signs:  Patient profile:   70 year old female Height:      62 inches (157.48 cm) Weight:      147.8 pounds (67.18 kg) BMI:     27.13 O2 Sat:      95 % on Room air Temp:     97.5 degrees F (36.39 degrees C) oral Pulse rate:   68 / minute BP sitting:   112 / 82  (left arm) Cuff size:   regular  Vitals Entered By: Tomma Lightning (May 14, 2010 4:25 PM)  O2 Flow:  Room air CC: cough & chest congestion, URI symptoms Is Patient Diabetic? No Pain Assessment Patient in pain? no      Comments Pt states she been coughing up yellowish/white  phlegm    Primary Care Provider:  [facility]  CC:  cough & chest congestion and URI symptoms.  History of Present Illness:  URI Symptoms      This is a 70 year old woman who presents with URI symptoms.  started with head congestion, moves into chest and now starting with early asthma  - .  The patient reports nasal congestion, purulent nasal discharge, sore throat, and productive cough, but denies earache and sick contacts.  Associated symptoms include low-grade fever (<100.5 degrees) and wheezing.  The patient denies dyspnea and diarrhea.  The patient also reports seasonal symptoms.  The patient denies response to antihistamine, headache, muscle aches, and severe fatigue.  The patient denies the following risk factors for Strep sinusitis: tooth pain and Strep exposure.    Current Medications (verified): 1)  Clonazepam 0.5 Mg Tabs (Clonazepam) .... Take 1-2  By Mouth Daily 2)  Propranolol Hcl 10 Mg Tabs (Propranolol Hcl) .... Take 1 Tablet By Mouth Three Times A Day 3)  Synthroid 100 Mcg Tabs (Levothyroxine Sodium) .... Take 1 Tablet By Mouth Once A Day 4)  Isosorbide Mononitrate Cr 30 Mg  Tb24 (Isosorbide Mononitrate) .... Take 1/2 Tablet Once Daily 5)  Crestor 20 Mg  Tabs (Rosuvastatin Calcium) .... Take One Tablet Once Daily--On Hold 6)  Nitroquick 0.4 Mg  Subl (Nitroglycerin) ....  Take Sl As Needed and As Directed. 7)  Nasonex 50 Mcg/act  Susp (Mometasone Furoate) .... Take As Needed and As Directed. 8)  Trimethoprim 100 Mg  Tabs (Trimethoprim) .... Take One Tablet Once Daily 9)  Qvar 80 Mcg/act Aers (Beclomethasone Dipropionate) .... 2 Puffs and Rinse, Twice Daily 10)  Proair Hfa 108 (90 Base) Mcg/act Aers (Albuterol Sulfate) .... 2 Puffs Four Times Per Day As Needed For Shortness of Breath 11)  Sertraline Hcl 25 Mg Tabs (Sertraline Hcl) .Marland Kitchen.. 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   Review of Systems  The patient denies vision loss, hoarseness, headaches, hemoptysis, and abdominal pain.    Physical Exam  General:  alert, well-developed, well-nourished, and cooperative to examination.    Eyes:  vision grossly intact; pupils equal, round  and reactive to light.  conjunctiva and lids normal.    Nose:  no external deformity and no external erythema.   Mouth:  teeth and gums in good repair; mucous membranes moist, without lesions or ulcers. oropharynx clear without exudate, mod erythema. +PND -yellow Lungs:  normal respiratory effort, no intercostal retractions or use of accessory muscles;  few rhonchi breath sounds bilaterally, improved with cough,  but no crackles and no wheezes at this time.    Heart:  normal rate, regular rhythm, no murmur, and no rub. BLE without  edema.   Impression & Recommendations:  Problem # 1:  BRONCHITIS NOT SPECIFIED AS ACUTE OR CHRONIC (ICD-490)  recurrent symptoms with hx progression into asthma flares - currently afeb but +yellow phlegm and increased use of rescue MDI albuterol - will tx with 7 d cephalexin as pt prefers this efficacy to Zpack in past - cont asthma meds - education provided on need for Qvar daily to suppress symptoms, not just during active flares Her updated medication list for this problem includes:    Trimethoprim 100 Mg Tabs (Trimethoprim) .Marland Kitchen... Take one tablet once daily    Qvar 80 Mcg/act Aers (Beclomethasone dipropionate) .Marland Kitchen... 2 puffs and rinse, twice daily    Proair Hfa 108 (90 Base) Mcg/act Aers (Albuterol sulfate) .Marland Kitchen... 2 puffs four times per day as needed for shortness of breath    Cephalexin 500 Mg Tabs (Cephalexin) .Marland Kitchen... 1 by mouth three times a day x 7 days  Take antibiotics and other medications as directed. Encouraged to push clear liquids, get enough rest, and take acetaminophen as needed. To be seen in 5-7 days if no improvement, sooner if worse.  Orders: Prescription Created Electronically [phone])  Complete Medication List: 1)  Clonazepam 0.5 Mg Tabs (Clonazepam) .... Take 1-2  by mouth daily 2)  Propranolol Hcl 10 Mg Tabs (Propranolol hcl) .... Take 1 tablet by mouth three times a day 3)  Synthroid 100 Mcg Tabs (Levothyroxine sodium) .... Take 1 tablet by mouth once a day 4)  Isosorbide Mononitrate Cr 30 Mg Tb24 (Isosorbide mononitrate) .... Take 1/2 tablet once daily 5)  Crestor 20 Mg Tabs (Rosuvastatin calcium) .... Take one tablet once daily--on hold 6)  Nitroquick 0.4 Mg Subl (Nitroglycerin) .... Take sl as needed and as directed. 7)  Nasonex 50 Mcg/act Susp (Mometasone furoate) .... Take as needed and as directed. 8)  Trimethoprim 100 Mg Tabs (Trimethoprim) .... Take one tablet once daily 9)  Qvar 80 Mcg/act Aers (Beclomethasone dipropionate) .... 2 puffs and rinse, twice  daily 10)  Proair Hfa 108 (90 Base) Mcg/act Aers (Albuterol sulfate) .... 2 puffs four times per day as needed for shortness of breath 11)  Sertraline Hcl 25 Mg Tabs (Sertraline hcl) .Marland Kitchen.. 1 by mouth qam 12)  Cephalexin 500 Mg Tabs (Cephalexin) .Marland Kitchen.. 1 by mouth three times a day x 7 days  Patient Instructions: 1)  it was good to see you today. 2)  antibiotics for your infection symptoms as discussed - your prescription has been electronically submitted to your pharmacy. Please take as directed. Contact our office if you believe you're having problems with the medication(s).  3)  Get plenty of rest, drink lots of clear liquids, and use Tylenol or Ibuprofen for fever and comfort. Return in 7-10 days if you're not better:sooner if you're feeling worse. Prescriptions: CEPHALEXIN 500 MG TABS (CEPHALEXIN) 1 by mouth three times a day x 7 days  #21 x 0   Entered and Authorized by:  Rowe Clack MD   Signed by:   Rowe Clack MD on 05/14/2010   Method used:   [facility]        [facility] [facility]retail)       [address]       [place], [place]  [address]       Ph: [phone] or [phone]       Fax: [phone]   RxID:   [id number]

## 2010-12-09 NOTE — Assessment & Plan Note (Signed)
Summary: yearly f/u/medicare/labs after/#/cd   Vital Signs:  Patient profile:   70 year old female Height:      62 inches Weight:      147 pounds BMI:     26.98 O2 Sat:      96 % on Room air Temp:     97.5 degrees F oral Pulse rate:   73 / minute BP sitting:   110 / 80  (right arm) Cuff size:   regular  Vitals Entered By: Charlynne Cousins CMA (June 09, 2010 2:39 PM)  O2 Flow:  Room air CC: CPX/ ab   Primary Care Provider:  Hiep Ollis  CC:  CPX/ ab.  History of Present Illness: In the interval since her last visit she has seen Dr. Erling Cruz for tremor evaluation. He found weakness in the extremities which lead to NCS/EMG studies which were normal. She was B12 deficient and has been started on medication. Dr. Erling Cruz did recommend stopping crestor. Dr. Lia Foyer did agree and crestor was stopped. She did have a total CK that was normal at 66. Off crestor she has seen no change in her condition or the way her legs feel since being off crestor. She did see Dr. Lia Foyer and had a normal exam, including normal EKG. She denies any change in vision, no paresthesia, no focal weakness, no cognitive changes. She has otherwise feeling OK.   She did have a problem with Irritible bladder. She was treated with botox but had subsequent retention problems and recurrent infections. She has opted to not have retreatment. Dr. Gaynelle Arabian has suggest a weekly treatment ( peroneal nerve stimulation?) weekly but she is holding off for now due to the need for weekly treatments.  She is current with colonoscopy and is having mammogram next week.   Current Medications (verified): 1)  Clonazepam 0.5 Mg Tabs (Clonazepam) .... Take 1-2  By Mouth Daily 2)  Propranolol Hcl 10 Mg Tabs (Propranolol Hcl) .... Take 1 Tablet By Mouth Three Times A Day 3)  Synthroid 100 Mcg Tabs (Levothyroxine Sodium) .... Take 1 Tablet By Mouth Once A Day 4)  Isosorbide Mononitrate Cr 30 Mg  Tb24 (Isosorbide Mononitrate) .... Take 1/2 Tablet Once  Daily 5)  Crestor 20 Mg  Tabs (Rosuvastatin Calcium) .... Take One Tablet Once Daily--On Hold 6)  Nitroquick 0.4 Mg  Subl (Nitroglycerin) .... Take Sl As Needed and As Directed. 7)  Nasonex 50 Mcg/act  Susp (Mometasone Furoate) .... Take As Needed and As Directed. 8)  Trimethoprim 100 Mg  Tabs (Trimethoprim) .... Take One Tablet Once Daily 9)  Qvar 80 Mcg/act Aers (Beclomethasone Dipropionate) .... 2 Puffs and Rinse, Twice Daily 10)  Proair Hfa 108 (90 Base) Mcg/act Aers (Albuterol Sulfate) .... 2 Puffs Four Times Per Day As Needed For Shortness of Breath 11)  Sertraline Hcl 25 Mg Tabs (Sertraline Hcl) .Marland Kitchen.. 1 By Mouth Qam 12)  Calcium With Vitamin D .... 1 Once Daily 13)  Cyanocobalamin 1000 Mcg/ml Soln (Cyanocobalamin) .Marland Kitchen.. 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   Risk Factors: Smoking Status: never (06/04/2009) Passive Smoke Exposure: yes (06/01/2008)  Review  of Systems  The patient denies anorexia, fever, weight loss, weight gain, decreased hearing, hoarseness, chest pain, dyspnea on exertion, peripheral edema, prolonged cough, headaches, abdominal pain, melena, severe indigestion/heartburn, hematuria, suspicious skin lesions, transient blindness, difficulty walking, depression, abnormal bleeding, enlarged lymph nodes, and breast masses.    Physical Exam  General:  WNWD mildly overweight white female in no distress Head:  normocephalic and atraumatic.   Eyes:  vision grossly intact, pupils equal, pupils round, corneas and lenses clear, and no injection.   Ears:  R ear normal and L ear normal.  Mild cerumen left worse than right. For irrigation Nose:  no external deformity and no external erythema.   Mouth:  Oral mucosa and oropharynx without lesions or exudates.  Teeth in good repair. Neck:  supple, full ROM, no thyromegaly, no JVD, and no carotid bruits.   Chest Wall:  no deformities and no tenderness.   Breasts:  No mass, nodules, thickening, tenderness, bulging, retraction, inflamation, nipple discharge or skin changes noted.   Lungs:  normal respiratory effort, normal breath sounds, and no wheezes.   Heart:  normal rate, regular rhythm, no murmur, no rub, no JVD, and no HJR.   Abdomen:  soft, non-tender, normal bowel sounds, no masses, no guarding, and no hepatomegaly.   Genitalia:  deferred Msk:  normal ROM, no joint tenderness, no joint swelling, no joint warmth, no redness over joints, and no joint instability.   Pulses:  2+ radial and DP pulses Extremities:  No clubbing, cyanosis, edema, or deformity noted with normal full range of motion of all joints.   Neurologic:  alert & oriented X3 and cranial nerves II-XII intact.  Strength grossly intact. Marked resting tremor. Mildly ataxic gait. DTR's 2+ normal. Furhter testing deferred to Neurologist. Skin:  turgor normal, color normal, no rashes, no suspicious lesions, no ecchymoses, and no  ulcerations.   Cervical Nodes:  no anterior cervical adenopathy and no posterior cervical adenopathy.   Axillary Nodes:  no R axillary adenopathy and no L axillary adenopathy.   Psych:  Oriented X3, memory intact for recent and remote, normally interactive, good eye contact, and not anxious appearing.     Impression & Recommendations:  Problem # 1:  WEAKNESS (ICD-780.79) Patient with motor weakness diagnosed by Dr. Erling Cruz. She has been off crestor for several weeks and has not noticed any change in motor strength. He total CK was in normal range. She has concerns due to her daughter having been diagnosed with MS.  Plan - additional lab to rule out connective tissue disease or generalized inflammatory disease  Orders: TLB-Sedimentation Rate (ESR) (85652-ESR) T-Antinuclear Antib (ANA) (73220-25427)  Problem # 2:  HYPERCHOLESTEROLEMIA  IIA (ICD-272.0) Patient is off Crestor.  Plan - establish new baseline  Her updated medication list for this problem includes:    Crestor 20 Mg Tabs (Rosuvastatin calcium) .Marland Kitchen... Take one tablet once daily--on hold  Orders: TLB-Lipid Panel (80061-LIPID) TLB-Hepatic/Liver Function Pnl (80076-HEPATIC)  Addendum - LDL 161.5, HDL 51.1  Plan - will need to consider restarting Crestor, cessation has made no difference in symptoms, if OK with Dr. Lia Foyer  Problem # 3:  CORONARY HEART DISEASE (ICD-414.00) Presently stable with no symptoms. She is followed closely by Dr. Lia Foyer  Her updated medication list for this problem includes:    Propranolol Hcl 10 Mg Tabs (Propranolol hcl) .Marland Kitchen... Take 1 tablet by mouth three times a day    Isosorbide Mononitrate Cr 30 Mg Tb24 (Isosorbide mononitrate) .Marland Kitchen... Take 1/2 tablet once daily    Nitroquick 0.4 Mg Subl (Nitroglycerin) .Marland Kitchen... Take sl as needed and as directed.  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   Problem # 5:  HYPOTHYROIDISM (ICD-244.9) Due for lab with recommendations to follow.  Her updated medication list for this problem includes:    Synthroid 100 Mcg Tabs (Levothyroxine sodium) .Marland Kitchen... Take 1 tablet by mouth once a day  Orders: TLB-TSH (Thyroid Stimulating Hormone) (84443-TSH)  Addendum - TSH in normal range.  Plan continue present medications.   Problem # 6:  ASTHMA (ICD-493.90) Stable. She has not had frequent use of breakthrough medications.   Her updated medication list for this problem includes:    Qvar 80 Mcg/act Aers (Beclomethasone dipropionate) .Marland Kitchen... 2 puffs and rinse, twice daily    Proair Hfa 108 (90 Base) Mcg/act Aers (Albuterol sulfate) .Marland Kitchen... 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 Her tremor has gotten worse and she has developed some muscle weakness. The later has not improved off statins.  Plan - per Dr. Erling Cruz  Problem # 8:  IRRITABLE BLADDER (ICD-596.8) See HPI discussion. She is informed about peroneal nerve stimulation. She will consider this further but at this time the need for too much commuting dissuades her.   Problem # 9:  Preventive Health Care (ICD-V70.0) History as outlined. Exam is unremarkable except for tremor. Lab results are fine. Last colonoscopy '04. Current with immunizations. Current with mammography  Patient is independent in all ADLs; she has mildly increased fall risk due to tremor and muscle weakness - she does exercise caution. She doe not have injury risk.  In summary - a very nice woman who has weakness as noted and will follow-up with Dr. Erling Cruz. She does have elevated LDL off medication and will need to restart crestor if OK with Dr. Lia Foyer. She is current with health maintenance. She will return for routine follow-up in  4 months.   Complete Medication List: 1)  Clonazepam 0.5 Mg Tabs (Clonazepam) .... Take 1-2  by mouth daily 2)  Propranolol Hcl 10 Mg Tabs (Propranolol hcl) .... Take 1 tablet by mouth three times a day 3)  Synthroid 100 Mcg Tabs (Levothyroxine sodium) .... Take 1 tablet by mouth once a day 4)  Isosorbide Mononitrate Cr 30 Mg Tb24 (Isosorbide mononitrate) .... Take 1/2 tablet once daily 5)  Crestor 20 Mg Tabs (Rosuvastatin calcium) .... Take one tablet once daily--on hold 6)  Nitroquick 0.4 Mg Subl (Nitroglycerin) .... Take sl as needed and as directed. 7)  Nasonex 50 Mcg/act Susp (Mometasone furoate) .... Take as needed and as directed. 8)  Trimethoprim 100 Mg Tabs (Trimethoprim) .... Take one tablet once daily 9)  Qvar 80 Mcg/act Aers (Beclomethasone dipropionate) .Marland KitchenMarland KitchenMarland Kitchen  2 puffs and rinse, twice daily 10)  Proair Hfa 108 (90 Base) Mcg/act Aers (Albuterol sulfate) .... 2 puffs four times per day as needed for shortness of breath 11)  Sertraline Hcl 25 Mg Tabs (Sertraline hcl) .Marland Kitchen.. 1 by mouth qam 12)  Calcium With Vitamin D  .... 1 once daily 13)  Cyanocobalamin 1000 Mcg/ml Soln (Cyanocobalamin) .Marland Kitchen.. 1 shot every week for 8 weeks and then once a month  Other Orders: TLB-CBC Platelet - w/Differential (85025-[id number]) Subsequent annual wellness visit with prevention plan (M1962)   Patient: [name] [name] Note: All result statuses are Final unless otherwise noted.  Tests: (1) Lipid Panel (LIPID)   Cholesterol          [H]  233 mg/dL                   0-200     ATP III Classification            Desirable:  < 200 mg/dL                    Borderline High:  200 - 239 mg/dL               High:  > = 240 mg/dL   Triglycerides        [H]  201.0 mg/dL                 0.0-149.0     Normal:  <150 mg/dL     Borderline High:  150 - 199 mg/dL   HDL                       51.10 mg/dL                 >39.00   VLDL Cholesterol     [H]  40.2 mg/dL                  0.0-40.0  CHO/HDL Ratio:  CHD Risk                              5                    Men          Women     1/2 Average Risk     3.4          3.3     Average Risk          5.0          4.4     2X Average Risk          9.6          7.1     3X Average Risk          15.0          11.0                           Tests: (2) Hepatic/Liver Function Panel (HEPATIC)   Total Bilirubin           0.5 mg/dL                   0.3-1.2   Direct Bilirubin          0.1 mg/dL  0.0-0.3   Alkaline Phosphatase      104 U/L                     39-117   AST                       21 U/L                      0-37   ALT                       26 U/L                      0-35   Total Protein             6.2 g/dL                    6.0-8.3   Albumin                   4.0 g/dL                    3.5-5.2  Tests: (3) TSH (TSH)   FastTSH                   1.11 uIU/mL                 0.35-5.50  Tests: (4) CBC Platelet w/Diff (CBCD)   White Cell Count          5.4 K/uL                    4.5-10.5   Red Cell Count            4.40 Mil/uL                 3.87-5.11   Hemoglobin                13.5 g/dL                   12.0-15.0   Hematocrit                39.5 %                      36.0-46.0   MCV                       89.8 fl                     78.0-100.0   MCHC                      34.1 g/dL                   30.0-36.0   RDW                       13.8 %                      11.5-14.6   Platelet Count            237.0 K/uL                  150.0-400.0   Neutrophil %  56.2 %                      43.0-77.0   Lymphocyte %              33.1 %                      12.0-46.0   Monocyte %                9.0 %                       3.0-12.0   Eosinophils%              1.3 %                       0.0-5.0   Basophils %               0.4 %                       0.0-3.0   Neutrophill Absolute      3.1 K/uL                    1.4-7.7   Lymphocyte Absolute       1.8 K/uL                    0.7-4.0   Monocyte Absolute         0.5 K/uL                     0.1-1.0  Eosinophils, Absolute                             0.1 K/uL                    0.0-0.7   Basophils Absolute        0.0 K/uL                    0.0-0.1  Tests: (5) BMP (METABOL)   Sodium                    139 mEq/L                   135-145   Potassium                 4.6 mEq/L                   3.5-5.1   Chloride                  104 mEq/L                   96-112   Carbon Dioxide            31 mEq/L                    19-32   Glucose              [H]  101 mg/dL                   70-99   BUN  16 mg/dL                    6-23   Creatinine                0.7 mg/dL                   0.4-1.2   Calcium                   9.3 mg/dL                   8.4-10.5   GFR                       83.74 mL/min                >60  Tests: (6) Sed Rate (ESR)   Sed Rate                  11 mm/hr                    0-22  Tests: (7) Cholesterol LDL - Direct (DIRLDL)  Cholesterol LDL - Direct                             161.5 mg/dL     Optimal:  <100 mg/dL     Near or Above Optimal:  100-129 mg/dL     Borderline High:  130-159 mg/dL     High:  160-189 mg/dL     Very High:  >190 mg/dL  Tests: (1) Anti Nuclear Antibody (ANA) Reflex (23900)  Anti Nuclear Antibody (ANA)                             NEG                         NEGATIVE  Preventive Care Screening  Colonoscopy:    Date:  03/23/2003    Results:  normal      Preventive Care Screening  Colonoscopy:    Date:  03/23/2003    Results:  normal

## 2010-12-09 NOTE — Letter (Signed)
Summary: [facility] Phone Note  [facility] Phone Note   Imported By: Sallee Provencal 05/09/2010 12:03:45  _____________________________________________________________________  External Attachment:    Type:   Image     Comment:   External Document

## 2010-12-10 ENCOUNTER — Telehealth: Payer: Self-pay | Admitting: Internal Medicine

## 2010-12-11 NOTE — Miscellaneous (Signed)
Summary: update med  Clinical Lists Changes  Medications: Changed medication from CRESTOR 20 MG  TABS (ROSUVASTATIN CALCIUM) Take one tablet once daily--ON HOLD to CRESTOR 20 MG  TABS (ROSUVASTATIN CALCIUM) Take one tablet once daily

## 2010-12-17 NOTE — Progress Notes (Signed)
  Phone Note Refill Request Message from:  Fax from Pharmacy on December 10, 2010 12:02 PM  Refills Requested: Medication #1:  CLONAZEPAM 0.5 MG TABS Take 1-2  by mouth daily [facility] [address] [place], please Advise refills  Initial call taken by: Industry,  December 10, 2010 12:02 PM  Follow-up for Phone Call        ok for refill x 5 Follow-up by: Neena Rhymes MD,  December 11, 2010 5:07 AM    Prescriptions: CLONAZEPAM 0.5 MG TABS (CLONAZEPAM) Take 1-2  by mouth daily  #60 x 5   Entered by:   Ami Bullins CMA   Authorized by:   Neena Rhymes MD   Signed by:   Charlynne Cousins CMA on 12/11/2010   Method used:   Telephoned to ...       [facility]  [place]       [address]       [place], [place]  [address]       Ph: [phone] or [phone]       Fax: [phone]   RxID:   [id number]

## 2011-01-02 ENCOUNTER — Telehealth: Payer: Self-pay | Admitting: Internal Medicine

## 2011-01-06 NOTE — Progress Notes (Signed)
  Phone Note Refill Request Message from:  Fax from Pharmacy on January 02, 2011 10:16 AM  Refills Requested: Medication #1:  CLONAZEPAM 0.5 MG TABS Take 1-2  by mouth daily   Last Refilled: 09/05/2010 FAx from [facility] in [place], please Advise refills.  Initial call taken by: [place],  January 02, 2011 10:17 AM  Follow-up for Phone Call        ok x 5 Follow-up by: Neena Rhymes MD,  January 02, 2011 11:22 AM    Prescriptions: CLONAZEPAM 0.5 MG TABS (CLONAZEPAM) Take 1-2  by mouth daily  #60 x 5   Entered by:   Ami Bullins CMA   Authorized by:   Neena Rhymes MD   Signed by:   Charlynne Cousins CMA on 01/02/2011   Method used:   Telephoned to ...       [facility]  [address] (retail)       [address]       [place], [place]  [address]       Ph: [phone] or [phone]       Fax: [phone]   RxID:   [id number]

## 2011-01-21 ENCOUNTER — Ambulatory Visit: Payer: Self-pay | Admitting: Cardiology

## 2011-02-21 ENCOUNTER — Encounter: Payer: Self-pay | Admitting: Cardiology

## 2011-02-24 ENCOUNTER — Encounter: Payer: Self-pay | Admitting: Cardiology

## 2011-02-24 ENCOUNTER — Ambulatory Visit (INDEPENDENT_AMBULATORY_CARE_PROVIDER_SITE_OTHER): Payer: Medicare Other | Admitting: Cardiology

## 2011-02-24 VITALS — BP 130/64 | HR 68 | Ht 62.0 in | Wt 146.8 lb

## 2011-02-24 DIAGNOSIS — I2510 Atherosclerotic heart disease of native coronary artery without angina pectoris: Secondary | ICD-10-CM

## 2011-02-24 DIAGNOSIS — E7800 Pure hypercholesterolemia, unspecified: Secondary | ICD-10-CM

## 2011-02-24 NOTE — Patient Instructions (Signed)
Your physician wants you to follow-up in: 1 YEAR.  You will receive a reminder letter in the mail two months in advance. If you don't receive a letter, please call our office to schedule the follow-up appointment.  Your physician recommends that you continue on your current medications as directed. Please refer to the Current Medication list given to you today.

## 2011-03-05 NOTE — Assessment & Plan Note (Signed)
She is back on Crestor.  She has not had repeat lipids that I can tell.  Will continue to recommend medical treatment.  Check lipid and liver profile .

## 2011-03-05 NOTE — Progress Notes (Signed)
HPI:  [name] is in for a follow up visit.  Overall she is doing pretty well.  There has been no change.  She says she has no chest pain.  She says at times she will experience a bit of indigestion, but she is almost entirely sure that this is that.  There has been virtually no change in her movement disorder, and she has continued follow up with Dr. Erling [name].  No current pain.    Current Outpatient Prescriptions  Medication Sig Dispense Refill  . albuterol (PROAIR HFA) 108 (90 BASE) MCG/ACT inhaler Inhale 2 puffs into the lungs every 6 (six) hours as needed.        . beclomethasone (QVAR) 80 MCG/ACT inhaler Inhale 1 puff into the lungs as needed.        . Calcium Carbonate-Vitamin D (CALCIUM-VITAMIN D) 500-200 MG-UNIT per tablet Take 1 tablet by mouth daily.        . clonazePAM (KLONOPIN) 0.5 MG tablet Take 0.5 mg by mouth daily.        . cyanocobalamin (,VITAMIN B-12,) 1000 MCG/ML injection Inject 1,000 mcg into the muscle every 30 (thirty) days.        . cyanocobalamin 1000 MCG tablet Take 100 mcg by mouth every 30 (thirty) days.        . isosorbide mononitrate (IMDUR) 30 MG 24 hr tablet 1/2 tab po qd       . levothyroxine (SYNTHROID, LEVOTHROID) 100 MCG tablet Take 100 mcg by mouth daily.        . mometasone (NASONEX) 50 MCG/ACT nasal spray 2 sprays by Nasal route as needed.       . nitroGLYCERIN (NITROSTAT) 0.4 MG SL tablet Place 0.4 mg under the tongue every 5 (five) minutes as needed.        . rosuvastatin (CRESTOR) 20 MG tablet Take 20 mg by mouth daily.        . sertraline (ZOLOFT) 25 MG tablet Take 25 mg by mouth daily.        Marland Kitchen trimethoprim (TRIMPEX) 100 MG tablet Take 100 mg by mouth daily.          Allergies no known allergies  Past Medical History  Diagnosis Date  . GERD (gastroesophageal reflux disease)   . Familial tremor   . Unspecified chronic bronchitis   . Vaginitis   . Irritable bladder   . Pneumonia   . Hyperthyroidism   . Depression   . Asthma   . CHD (coronary  heart disease)     Past Surgical History  Procedure Date  . Laparoscopic nissen fundoplication   . Cystectomy   . Tumor removal   . Tubal ligation     Family History  Problem Relation Age of Onset  . Coronary artery disease    . Heart attack    . Hypertension    . Diabetes    . Breast cancer      History   Social History  . Marital Status: Married    Spouse Name: N/A    Number of Children: N/A  . Years of Education: N/A   Occupational History  . Not on file.   Social History Main Topics  . Smoking status: Never Smoker   . Smokeless tobacco: Not on file  . Alcohol Use: No  . Drug Use: No  . Sexually Active: Not on file   Other Topics Concern  . Not on file   Social History Narrative  . No narrative on file  ROS: Please see the HPI.  All other systems reviewed and negative.  PHYSICAL EXAM:  BP 130/64  Pulse 68  Ht 5' 2"  (1.575 m)  Wt 146 lb 12.8 oz (66.588 kg)  BMI 26.85 kg/m2  General: Well developed, well nourished, in no acute distress. Head:  Normocephalic and atraumatic. Neck: no JVD Lungs: Clear to auscultation and percussion. Heart: Normal S1 and S2.  No murmur, rubs or gallops.  Abdomen:  Normal bowel sounds; soft; non tender; no organomegaly Pulses: Pulses normal in all 4 extremities. Extremities: No clubbing or cyanosis. No edema. Neurologic: Alert and oriented x 3.  Obvious movement disorder without change.    EKG:  NSR.  WNL.  ASSESSMENT AND PLAN:

## 2011-03-05 NOTE — Assessment & Plan Note (Signed)
She continues to remain stable on a current medical regimen, without new symptoms.

## 2011-03-19 ENCOUNTER — Telehealth: Payer: Self-pay | Admitting: Cardiology

## 2011-03-19 DIAGNOSIS — I2510 Atherosclerotic heart disease of native coronary artery without angina pectoris: Secondary | ICD-10-CM

## 2011-03-19 DIAGNOSIS — E7800 Pure hypercholesterolemia, unspecified: Secondary | ICD-10-CM

## 2011-03-19 NOTE — Telephone Encounter (Signed)
Pt rtn your call/lg

## 2011-03-19 NOTE — Telephone Encounter (Signed)
Per note from Dr Lia Foyer based on recent office visit:  [name] [name] She needs a lipid and liver. Thx. TS  The pt will come into the office tomorrow for a lipid and liver profile.  The pt restarted Crestor in December or January after being off of the medication for 6 months due to muscle pain.

## 2011-03-20 ENCOUNTER — Other Ambulatory Visit (INDEPENDENT_AMBULATORY_CARE_PROVIDER_SITE_OTHER): Payer: Medicare Other | Admitting: *Deleted

## 2011-03-20 DIAGNOSIS — I2510 Atherosclerotic heart disease of native coronary artery without angina pectoris: Secondary | ICD-10-CM

## 2011-03-20 DIAGNOSIS — E7800 Pure hypercholesterolemia, unspecified: Secondary | ICD-10-CM

## 2011-03-20 LAB — HEPATIC FUNCTION PANEL
ALT: 21 U/L (ref 0–35)
AST: 18 U/L (ref 0–37)
Bilirubin, Direct: 0.1 mg/dL (ref 0.0–0.3)
Total Bilirubin: 0.5 mg/dL (ref 0.3–1.2)

## 2011-03-20 LAB — LIPID PANEL: VLDL: 10.4 mg/dL (ref 0.0–40.0)

## 2011-03-24 NOTE — Op Note (Signed)
[name], [name] [name]               ACCOUNT NO.:  [id number]   MEDICAL RECORD NO.:  [id number]          PATIENT TYPE:  AMB   LOCATION:  [facility]                         FACILITY:  [facility]   PHYSICIAN:  Sigmund I. Gaynelle Arabian, M.D.DATE OF BIRTH:  [birth year]   DATE OF PROCEDURE:  03/09/2008  DATE OF DISCHARGE:                               OPERATIVE REPORT   PREOPERATIVE DIAGNOSIS:  Refractory sensory urgency.   POSTOPERATIVE DIAGNOSIS:  Refractory sensory urgency.   PROCEDURE:  Cystoscopy, injection of botulinum A toxin.   SURGEON:  Sigmund I. Gaynelle Arabian, M.D.   ASSISTANT:  Aron Baba, M.D.   ANESTHESIA:  General.   INJECTABLE:  200 units of botulinum A toxin in 20 mL sterile water.   INDICATIONS FOR PROCEDURE:  The patient is a 70 year old female with  complaint of urinary urgency.  This was refractory to medical  management.  She has been informed of the risks and benefits of  botulinum toxin including this as an off-label use.   DESCRIPTION OF PROCEDURE:  The patient was brought to the operating  room.  She was identified by her armband.  Informed consent was verified  and preoperative timeout was performed.  After the successful induction  of anesthesia, the patient was moved to the dorsal lithotomy position  where all appropriate pressure points were padded to avoid compression  compartment syndrome.  The area was prepped and draped.  The surgeon was  glowned and gloved.  A 22 French cystoscopic sheath was used to  introduce the 30 degree cystoscopic lens transurethrally into the  bladder.  A pancystourethroscopy was performed.  Bilateral ureteral  orifices were noted to be in normal anatomic position along the trigone  and each was seen to efflux clear urine.  The remainder of the bladder  was inspected and was free of any mucosal lesions, erythema, foreign  bodies, diverticuli, or stones.  The scope was removed and the injection  bridge was inserted.  1 mL aliquots of the  above concentration of Botox  was injected in a 10 x 2 grid along the posterior wall of the bladder.  Once complete, the bladder was drained.  It was minimally  refilled.  There was no bleeding.  At this time the procedure was  terminated.  A B&O suppository was placed.  The patient tolerated the  procedure well and there were no complications.  Dr. Gaynelle Arabian was the  attending primary responsible physician who participated in the  procedure.     ______________________________  Aron Baba, M.D.      Sigmund I. Gaynelle Arabian, M.D.  Electronically Signed    Hillard Danker  D:  03/09/2008  T:  03/09/2008  Job:  [id number]

## 2011-03-24 NOTE — Assessment & Plan Note (Signed)
[facility] OFFICE NOTE   NAME:[name], [name] JENNIGES                      MRN:          [id number]  DATE:02/22/2008                            DOB:          November 02, 1941    PROBLEM:  Colorectal cancer screening.   REASON:  [name] was referred back for possible colonoscopy.  She  had a screening colonoscopy in 2004.  Diverticulosis was seen.  Except  for occasional abdominal discomfort, she has no ongoing GI complaints.  Specifically, she is without change in bowel habits, melena or  hematochezia.  Followup colonoscopy in 5 years was recommended at the  time of her procedure.   PHYSICAL EXAMINATION:  Pulse 68, blood pressure 108/58, weight 137.   IMPRESSION:  1. Nonspecific abdominal pain.  2. Colorectal cancer screening.  Since her last visit, the      recommendations for followup colonoscopy have been revised.  At      this point, I would recommend that we repeat her colonoscopy in 5      years.  The followup interval from a negative colonoscopy is      usually about 10 years, in the absence of new problems.     Sandy Salaam. Deatra Ina, MD,FACG  Electronically Signed    RDK/MedQ  DD: 02/22/2008  DT: 02/22/2008  Job #: [id number]

## 2011-03-24 NOTE — Letter (Signed)
June 02, 2007    Heinz Knuckles. Norins, MD  [address], [place] [address]   RE:  SHAI, RASMUSSEN  MRN:  [id number]  /  DOB:  12-Dec-1940   Dear Legrand Como:   I had the pleasure of seeing [name] [name] in the office today in followup  visit.  On occasion, she gets a little bit of shortness of breath and  she occasionally continues to have some dizziness.  She has not been  having significant chest pain recently.  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  She does not have a  definite perfusion defect in this anterior segment.  I spoke with the  patient and her husband in great detail after the catheterization and  reviewed the specific options with them.  Importantly, without a  perfusion defect in an otherwise stable patient, we felt that an initial  trial of medical therapy would be clearly warranted.  Risk factor  reduction with aiming the lipid to an LDL of less than 78-80 would be an  optimal regimen.  Clearly, her husband seems to be somewhat  uncomfortable with a medical approach to therapy, but again today I  spent nearly 45 minutes with them reviewing all of the recent studies  and all of the potential strategies for treating her.  It is not even  totally clear that a drug-eluting stent could be placed, but I suspect  that it would tolerate a drug-eluting stent, particularly 1 of our  smaller investigational 225 stents.   Moreover, and fortunately, the patient has been relatively stable  without significant chest discomfort.   Today on examination, the blood pressure was 120/74, pulse 68.  The lung fields were clear.  The cardiac rhythm was regular.   She seems to be stable from a cardiac standpoint.  I told her and her  husband that I would continue to follow her closely.  I also mentioned  that should she have a step-up in symptoms and frequent chest  discomfort, that  consideration could then strongly be given to  percutaneous intervention.  With an otherwise stable situation and no  perfusion defect on myocardial perfusion imaging in the distribution of  the LAD, I would be somewhat reluctant to consider an intervention at  this point in time.  Should she have a progression, she is to contact us  promptly, and we will then make decisions.  I appreciate the opportunity  of sharing in her care.    Sincerely,      [name]. Lia Foyer, MD, [facility]  Electronically Signed    TDS/MedQ  DD: 06/02/2007  DT: 06/03/2007  Job #: [id number]

## 2011-03-24 NOTE — Assessment & Plan Note (Signed)
[facility]                            CARDIOLOGY OFFICE NOTE   NAME:[name], [name] CAVENAUGH                      MRN:          [id number]  DATE:04/07/2007                            DOB:          January 31, 1941    REFERRING PHYSICIAN:  Heinz Knuckles. Norins, MD   OFFICE CONSULTATION   CHIEF COMPLAINT:  Fatigue x1 year.   HISTORY OF PRESENT ILLNESS:  [name] [name] is a very delightful 70 year old  woman whom I have not seen in many years.  For the past year, she has  had marked fatigue.  If she goes up steps 2 to 3 times, she gets clammy,  some nausea, and perhaps a little bit of chest tightness.  She wondered  whether this could be related to her thyroid, but her thyroid function  has been fairly normal.  She says she has a high tolerance for pain.  She also has noticed some dizziness with standing and some shortness of  breath.  There has been no radiation of these symptoms.  She has had a  history of asthma.  She underwent radionuclide imaging.  A myocardial  perfusion study was unremarkable, but exercise was associated with onset  of ST depression.  The ST depression was at least 2 mm in the inferior  and lateral leads.  There was fairly early recovery of the ST segments.  Importantly, her husband has recently had revascularization surgery and  really did not know he had a problem, and is desirous of his wife having  a heart catheterization.   PAST MEDICAL HISTORY:  Remarkable for a laparoscopic fundoplication in  3419.  She had a breast tumor that was benign removed in the 1970s.  She  has had water-filled cysts removed from the right hand on multiple  occasions.  She has had pneumonia x3.  She has had hypertension and has  been on hormone replacement therapy.  She has had a hiatal hernia  repair.   She has no known drug allergies.   MEDICATIONS:  1. Fish oil 2 daily.  2. Aspirin every 3 to 4 days.  3. Propranolol 10 mg b.i.d.  4. Synthroid 100 mcg  daily.  5. Clonazepam 0.5 mg daily.  6. Mylanta as needed.   FAMILY HISTORY:  Remarkable for a father who died of a heart attack at  57.  Her mother is 13 and has bladder problems.  She has a paternal  grandfather who died at 32 of a heart attack, and a grandfather who died  at 72 of a heart attack.  She has no brothers or sisters.   SOCIAL HISTORY:  She is retired.  She is active in her church and  fishing.  She exercises and does not smoke.   REVIEW OF SYSTEMS:  Remarkable for a history of a chronic tremor, for  which she has been on propranolol.  She had a Pap smear a year ago.  She  has had a flexible sigmoidoscopy.  She has had reflux.  She has had  occasional pain in the neck.  She does wear a hearing aid.  She does get  a little bit of dizziness when she stands.   PHYSICAL EXAMINATION:  She is alert and oriented in no acute distress.  Blood pressure is 140/80, equal in the right and left arms.  Weight is  137, height is 5 feet 2 inches.  Heart rate at rest is 68.  The carotid upstrokes are brisk.  There are no obvious carotid bruits.  LUNGS:  Fields are clear to auscultation and percussion.  The PMI is not displaced.  There is a normal first and second heart  sound without murmurs, rubs, or gallops.  ABDOMEN:  Soft.  Distal pulses are intact.   ELECTROCARDIOGRAM:  Demonstrates sinus bradycardia/sinus rhythm with a  rate of 68 with no significant EKG changes.   The patient has had a prior TEE.  She has had an echocardiogram.  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  The  right atrium and right ventricle were not enlarged, and there was very  mild mitral regurgitation.  Of note, the patient has also had prior  catecholamines with 24 hours urinates reveal normal catecholamines,  metanephrines, and VMA to rule out pheochromocytoma as a source of  palpitations.  This was done many years  ago.   Her recent TSH was 0.62 and free T4 was 1.3.  Cholesterol, LDL was 120  with a total of 190.  Her BUN and creatinine were normal.   IMPRESSION:  1. Abnormal exercise tolerance test in the setting of worsening      fatigue.  2. Family history of coronary artery disease.  3. Mild hypercholesterolemia.  4. Multiple surgeries as previously noted.   PLAN:  Her husband would like her to have a heart catheterization.  The  patient also feels that this would be worthwhile.  I have explained the  risks, benefits, and alternatives to the patient.  With the ST  depression with exercise, her hypercholesterolemia, and positive family  history, her progressive fatigue, and increasing concern, it would seem  that cardiac catheterization would be warranted to settle the issue and  decide on a treatment plan for the long term.  We have discussed the  risks and benefits, and alternatives.  She is agreeable to proceed.     [name]. Lia Foyer, MD, [facility]  Electronically Signed    TDS/MedQ  DD: 04/07/2007  DT: 04/07/2007  Job #: [id number]

## 2011-03-24 NOTE — Assessment & Plan Note (Signed)
[name] [name]                           PRIMARY CARE OFFICE NOTE   NAME:[name], [name] DIMAGGIO                      MRN:          [id number]  DATE:04/21/2007                            DOB:          Aug 29, 1941    [name] [name] was last seen in the office March 04, 2007. Please see that  complete dictation. The primary concern at that time was dyspnea with  exertion, particularly in the setting of multiple cardiac risk factors  including a positive family history, hyperlipidemia. Based on these risk  factors, the patient was sent for nuclear stress study performed Mar 21, 2007 which was read out as showing EKG changes with stress that were  significant but normal nuclear images with no definite scar or ischemia.  Based on this report, the patient came to see Dr. Addison [name] in  consultation. After reviewing her risk factors and discussing the  situation of her symptoms and her family history with the patient, it  was felt it was prudent and reasonable to move forward with cardiac  catheterization. Precatheterization labs Apr 07, 2007 revealed a normal  CBC with a hemoglobin of 12.9 grams; white count was 5500. Chemistries  were normal except for glucose that was topping the normal range at 115.  The patient did have a lipid profile which came back showing a total  cholesterol of 190, a LDL of 120, HDL of 44.5.   The patient came to cardiac catheterization which revealed that she had  a 30% proximal LAD lesion. She had a 60-70% mid LAD lesion. It was noted  that vessels were of small caliber. It was felt that the patient was not  a good candidate for percutaneous intervention given the nature of her  lesions and the concern in regards to vessel caliber and tolerably of  stenting. The patient was a candidate for medical management. The  patient was subsequently started by Dr. Lia [name] on isosorbide at 30 mg  1/2 tablet daily for vasodilatation and also Crestor 20  mg daily with  goal of getting her LDL cholesterol less than 80, given the presence of  vascular disease and the need to prevent progression. The patient was  not started on an angiotension-based drug at this time.   I reviewed all of these events with the patient in great detail  including her cardiac catheterization. I reinforced Dr. Maren [name]  explanation to her, and she felt that our explanations were congruent  and consistent, and she understands her cardiac risk.   The patient is here today for general physical exam. She did have a Pap  and pelvic at her last physical exam at age of 41 with no prior history  of abnormal Pap smear for this will be deferred at today's visit.   CURRENT MEDICATIONS:  1. Inderal 10 mg b.i.d.  2. Klonopin 0.5 mg daily or b.i.d. for tremor.  3. Omega-3 fatty acid.  4. Oxybutynin 5 mg b.i.d.  5. Synthroid 100 mcg daily.  6. Crestor 20 mg daily.  7. Isosorbide 30 mg 1/2 tablet daily.   REVIEW OF SYSTEMS:  Negative for constitutional, cardiovascular,  respiratory, GI or GU complaints at this time except as noted in the  previous history.   PHYSICAL EXAMINATION:  Temperature was 97.8, blood pressure 119/69,  pulse 78, weight 137.  GENERAL APPEARANCE:  This is a well-nourished, mildly overweight,  Caucasian woman who looks her stated age in no acute distress. 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  HEENT EXAM:  Normocephalic, atraumatic. EACs and TMs were clear on the  right, mild cerumen on the left. TMs were visible both sides. Oropharynx  with native dentition in good repair. No buccal or palatal lesions were  noted. Posterior pharynx was clear. Conjunctivae and sclerae were clear.  PERRLA. EOMI. Funduscopic exam was deferred to ophthalmology. Neck was  supple without thyromegaly. Nose:  No adenopathy was noted in the  cervical, supraclavicular or axillary regions.  CHEST:  No deformities were noted.  LUNGS:  Clear with no rales, wheezes or rhonchi.   BREAST EXAM:  Skin was normal. Nipples without discharge. No fixed mass,  lesion or abnormality was noted. The axilla was clear.  ABDOMEN:  Soft with no guarding or rebound. No organosplenomegaly was  noted.  PELVIC:  Deferred.  EXTREMITIES:  Without clubbing, cyanosis, edema or deformity.   ASSESSMENT AND PLAN:  1. Atherosclerotic vascular disease. Patient with mild lesions noted      on cardiac catheterization in a single vessel that was small vessel      caliber. Given her positive family history at this point, maximum      medical therapy is recommended by Dr. Lia [name], and I concur. She      will continue on Crestor with a goal of an LDL cholesterol of less      than 80. The patient's blood pressure is currently well controlled.      She will be working on her weight through her lipid management      including lifestyle and exercise. Will defer to Dr. Lia [name] as to      whether an angiotensin drug would be appropriate for this patient.  2. Familial tremor, stable, with no need for further treatment.  3. Hypothyroid disease. The patient's TSH was normal at 0.62. Free T4      was normal at 1.3.   PLAN:  1. Patient to continue on her present medications.  2. Irritable bladder. The patient is doing well with oxybutynin b.i.d.      and will continue the same.  3. Health maintenance. The patient is keep current with her      mammography. I believe she has had colonoscopy in the last several      years, although I do not have her chart available to me. We will      keep her on an every 10-year cycle.   In summary, this is a delightful patient with history as outlined above.  She is medically stable at this time. She is to have followup of her  lipids by Dr. Addison [name]. I have asked her to return to see me on a  p.r.n. basis.     [name] Knuckles Norins, MD  Electronically Signed   MEN/MedQ  DD: 04/21/2007  DT: 04/22/2007  Job #: [id number]   cc:   Despina Arias. [name] Foyer, MD,  [facility]

## 2011-03-24 NOTE — Letter (Signed)
May 17, 2008    Heinz Knuckles. Norins, MD  [address], [place] [address]   RE:  [name] [name], [name] [name]  MRN:  [id number]  /  DOB:  1941/04/20   Dear Ronalee Belts:   I had the pleasure of seeing your nice patient [name] in the  office today in followup.  Cardiac-wise, she seems to be getting along  well.  As you know, she has a movement disorder.  She also has had a  recent episode of sudden shortness of breath for which she went outside  and this was relieved.  She denies any exertional chest discomfort  whatsoever.  It did not really sound cardiac, and I know she has had a  history of asthma in the past.  She tells me that you subsequently sent  her to see Baird Lyons.  From a cardiac standpoint, she has been able  to do almost everything without too much difficulty.   Today on examination her blood pressure was 124/70, pulse was 77.  Her  lung fields were clear with various slight prolongation in the  expiratory phase.  The cardiac rhythm was regular.  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   Her EKG today is within normal limits.   In summary, the accumulated evidence suggests that she is doing well  from a cardiac standpoint.  If there is a change is her symptomatology,  she is to call us.  Otherwise, we will continue to treat her medically.  We will see her back in followup in 6 months.    Sincerely,      [name]. Lia Foyer, MD, [facility]  Electronically Signed    TDS/MedQ  DD: 05/17/2008  DT: 05/18/2008  Job #: [id number]

## 2011-03-24 NOTE — Letter (Signed)
November 15, 2007    Heinz Knuckles. Norins, MD  [address],  [place] [address]   RE:  KERIGAN, NARVAEZ  MRN:  [id number]  /  DOB:  02-26-1941   Dear Ronalee Belts:   I had the pleasure of seeing [name] [name] in the office today in follow-  up.  From a cardiac standpoint, she has done well.  She had not been  having any ongoing chest pain or pressure in the chest. Her biggest  complaints of been fatigued and at times dizziness.  None of this seems  to be associated with a really slow heart rate or any other major  symptoms.   Her medications include isosorbide 30 mg 1/2 tablet daily, Crestor 20 mg  daily, aspirin 81 mg daily, Synthroid 100 mcg daily,  omega 3 daily,  Klonopin 0.5 daily,  and Inderal 10 mg b.i.d. p.r.n.   On physical,  a blood pressure is 130/80, pulse of 67. The lung fields  are clear and the cardiac rhythm is regular.   Her EKG reveals sinus rhythm and no acute changes.   To briefly summarize overall, the patient is stable.  I will plan to see  her back in follow-up in about 6 months.  Should she have any problems  in the interim, please not hesitate to let me know and I would be happy  to see her at any time.  In the absence of further changes we will  continue on a medical regimen.    Sincerely,      [name]. Lia Foyer, MD, [facility]  Electronically Signed    TDS/MedQ  DD: 11/15/2007  DT: 11/15/2007  Job #: [id number]

## 2011-03-24 NOTE — Cardiovascular Report (Signed)
NAMESALLYANNE, [name]               ACCOUNT NO.:  [id number]   MEDICAL RECORD NO.:  [id number]          PATIENT TYPE:  OIB   LOCATION:  1963                         FACILITY:  [facility]   PHYSICIAN:  [name]. Lia Foyer, MD, FACCDATE OF BIRTH:  1941/11/05   DATE OF PROCEDURE:  DATE OF DISCHARGE:                            CARDIAC CATHETERIZATION   INDICATIONS:  [name] is a 70 year old white female who presents with  some progressive fatigue over the past year.  She has not had  significant chest pain.  Exercise tolerance testing revealed no  perfusion defect, but in fact a mild ST depression which resolved fairly  early into recovery.  She was referred for evaluation.  Her husband has  had a recent experience with having extensive coronary disease that was  otherwise unknown, and both the patient and her husband were interested  in pursuing cardiac catheterization for better definition of her  clinical condition.  There is a family history that is fairly strong for  coronary artery disease.   PROCEDURE:  1. Left heart catheterization.  2. Selective coronary arteriography.  3. Selective left ventriculography.   DESCRIPTION OF PROCEDURE:  The patient was brought to the  catheterization laboratory, and prepped and draped in usual fashion.  Anterior puncture the right femoral artery was easily entered.  A 4-  French sheath was then placed.  Following this, views of left and right  coronaries were obtained in multiple angiographic projections.  Central  aortic left ventricular pressures measured with pigtail.  Ventriculography was then performed in the RAO projection.  The patient  tolerated procedure without complications.  She was taken into the  holding area for monitoring.  Following the procedure.   HEMODYNAMICS:  1. Left ventricular pressure 147/14.  2. Aortic pressure 139/68.  3. No gradient pullback across aortic valve.   ANGIOGRAPHIC DATA:  1. Ventriculography was formed  in the RAO projection.  Overall      systolic function was vigorous.  No segmental abnormalities or      contraction were identified.  2. The left main is free of critical disease.  3. The left anterior descending artery has about 30% proximal      narrowing.  Following this, in the mid vessel at the takeoff of the      major diagonal which represents two vessels, there is an eccentric      plaque that demonstrates about 60 to at most 70% luminal reduction      it is fairly smooth.  The distal vessel was relatively small in      caliber and provides a distal LAD wraps the apex.  The remainder of      the vessel was without critical narrowing.  4. The circumflex provides two insignificant proximal marginal      branches, then a marginal branch of moderate size, a fourth branch      of moderate size as well and then a large posterolateral branch and      a tiny posterior descending vessel.  Other than minor luminal      irregularity, the circumflex system  which is codominant is free of      critical disease.  5. The right coronary artery demonstrates a fair amount of ostial      calcification.  It is a very small-caliber vessel.  This vessel      provides several thin inferior branches, but no significant vessel.      It is less than 1.4 mm in diameter.   CONCLUSION:  1. Well-preserved left ventricular function.  2. A 30% narrowing of proximal left anterior descending artery and 60-      70% narrowing of the mid left anterior descending artery.  3. Calcification of the right coronary ostium without critical      narrowing in the small nondominant vessel.   DISPOSITION:  The patient does not have a significant perfusion defect  by myocardial perfusion imaging.  She does in fact have mild ST  depression with exercise.  We will attempt to begin an initial program  of medical therapy, and then subsequent to that defer percutaneous  intervention unless she has further symptoms.   Importantly, the LAD does  involve two diagonal branches which are very small in caliber, and the  LAD may or may not be big enough for a drug-eluting stent.  These will  need to be considered.      [name]. Lia Foyer, MD, [facility]  Electronically Signed     TDS/MEDQ  D:  04/08/2007  T:  04/08/2007  Job:  [id number]   cc:   [name]. Lia Foyer, MD, [facility]  CV Laboratory  [place] Norins, MD

## 2011-03-24 NOTE — Assessment & Plan Note (Signed)
[facility] OFFICE NOTE   NAME:Crouse, DORRIS PIERRE                      MRN:          [id number]  DATE:11/20/2008                            DOB:          1941/08/03    [name] is in for a followup visit.  She really is doing quite well.  She denies any exertional chest pain.  She has moderate disease of left  anterior descending artery, treated medically.  She had a full physical  by Dr. Linda Hedges.  She received a letter telling her cholesterol was  excellent.  We do not have these results, but needless to say,  certainly, her cholesterol was in good shape a year ago.  There have  been no progressive symptoms.  Her 36 year old mother is now living with  her.   Medications include:  1. Aspirin 81 mg daily.  2. Crestor 20 mg daily.  3. Clonazepam 0.5 one to two daily.  4. Synthroid 100 mcg daily.  5. Flomax 0.4 mg daily.  6. Nitrofurantoin 1 daily.  7. Isosorbide mononitrate ER 30 mg one-half daily.   PHYSICAL EXAMINATION:  VITAL SIGNS:  Blood pressure is 107/68, pulse is  88.  LUNGS:  Lung fields are clear.  CARDIAC:  Rhythm is regular.  There is no S4 gallop.   Electrocardiogram demonstrates normal sinus rhythm essentially within  normal limits.   IMPRESSION:  1. Coronary artery disease, treated medically.  2. Hypercholesterolemia on lipid lowering therapy at target.   PLAN:  1. Return to clinic in 6 months.  2. Continue followup with Dr. Linda Hedges.     [name]. Lia Foyer, MD, [facility]  Electronically Signed    TDS/MedQ  DD: 11/20/2008  DT: 11/21/2008  Job #: [id number]

## 2011-03-24 NOTE — Letter (Signed)
July 13, 2007    Adella Hare, MD  [address]  [place], [place] [address]   RE:  [name], [name]  MRN:  [id number]  /  DOB:  August 02, 1941   Dear Legrand Como:   I had the pleasure of seeing [name] [name] in the office today in  followup. Clinically, she is doing extremely well. She has not been  having any ongoing chest pain. Her most recent LDL cholesterol was  markedly suppressed. The LDL was 45, down from 120 on only Crestor 20.  She has had no progression in symptoms.   On physical today, she is well-appearing. The blood pressure is 110/64,  pulse 72. The lung fields are clear. Cardiac rhythm is regular.   As you know, she stays on low dose Inderal primarily because of her  tremors. Her LDL is substantially improved and she has been relatively  asymptomatic. I plan to see her back in followup in another four months  at which time we will reassess her status. I appreciate the opportunity  of sharing in her care.    Sincerely,      [name] [name]. Lia Foyer, MD, [facility]  Electronically Signed    TDS/MedQ  DD: 07/13/2007  DT: 07/13/2007  Job #: [id number]

## 2011-03-24 NOTE — Letter (Signed)
April 12, 2007    Heinz Knuckles. Norins, MD  [address], [place] [address]   RE:  [name], [name]  MRN:  [id number]  /  DOB:  10/27/1941   Dear Ronalee Belts:   I had the pleasure of seeing your nice patient, [name] [name], in the  office today in a followup evaluation.  As you know, she has had several  problems.  These have included positional vertigo.  She has also had  some chest discomfort, and dyspnea with effort.  She underwent a  radionuclide imaging study, which did not demonstrate significant  perfusion defect, but she did in fact have some exertional ST  depression.  Based on this, we recommended consideration for cardiac  catheterization.  Her chest x-ray was unremarkable.  On Apr 08, 2007,  the patient underwent cardiac catheterization.  Overall systolic  function was vigorous, and no segmental abnormalities of contraction  were identified.  The LAD had about 30% proximal narrowing, and there  was about 60% to 70% narrowing in the midportion of the LAD with a  fairly focal mid plaque.  The remainder of the vessels without  significant narrowing.  The right coronary was a small caliber vessel.  There was some calcification of the right coronary osseum, but this was  a nondominant vessel.  Based on the findings, it was our feeling that  she should probably be treated medically, at least initially without a  significant perfusion defect, and without a significant medical regimen,  it seems likely that she could get along well.  Her major complaint  continues to be that of her dizziness, and her husband is clearly  concerned about this.   Today on examination, the blood pressure is 110/80, and the pulse is 64.  The lung fields are clear.  The cardiac rhythm is regular.  The  catheterization site is well healed.   I do think she will require further evaluation.  Fortunately, we know  her coronary anatomy at this point, and I think this can be treated  medically.  Her  laboratory studies were intact.  Her LDL cholesterol was  mildly elevated, and some consideration could be given to approaching  this with a low dose of a cholesterol lowering agent.  I think we also  need to understand her thyroid status to make sure that she does not  have an overactive thyroid.  I also think she may require evaluation by  Neurology.   Thanks for asking me to see her.    Sincerely,      [name]. Lia Foyer, MD, [facility]  Electronically Signed    TDS/MedQ  DD: 04/20/2007  DT: 04/20/2007  Job #: [id number]

## 2011-03-27 NOTE — Consult Note (Signed)
[place]  Patient:    [name] [name], [name] [name]                      MRN: [id number] Proc. Date: 05/05/00 Adm. Date:  [id number] Disc. Date: [id number] Attending:  [place] Cellar                          Consultation Report  PROCEDURE:  24-hour pH study.  HISTORY:  [name] has reflux-like symptoms despite medicines.  Test is performed for further evaluation utilizing a dual-channel probe.  Channel #1, corresponding to the distal channel, there were 75 episodes of reflux.  The longest episode lasted 26 minutes.  There was a total of 99 minutes of reflux lasting 6.9% of the time.  Total DeMeester score was 40.7 with normal less than 22.  Channel #2, corresponding to the proximal probe, there were 40 episodes of reflux, 30 occurring in the upright position.  This is a total of 93 minutes of reflux.  The 24-hour pH study indicates significant acid reflux in both the proximal and distal channels. DD:  05/05/00 TD:  05/06/00 Job: [id number]

## 2011-03-27 NOTE — Op Note (Signed)
[facility]  Patient:    [name] [name], [name] [name]                      MRN: [id number] Proc. Date: 08/12/00 Adm. Date:  [id number] Attending:  Erick Blinks CC:         [name] [name]. [name] [name], M.D. [facility]  [name] Knuckles. [name], M.D. [facility] [name] [name], M.D.   Operative Report  PREOPERATIVE DIAGNOSES:  Medically refractory gastroesophageal reflux disease.  POSTOPERATIVE DIAGNOSES:  Medically refractory gastroesophageal reflux disease.  PROCEDURE:  Laparoscopic nissen fundoplication over a size 50 bougie.  SURGEON:  Dr. Zella Richer.  ASSISTANT:  [name] [name], M.D.  ANESTHESIA:  General.  INDICATIONS FOR PROCEDURE:  [name] is a 70 year old female whose had gastroesophageal reflux disease for a number of years and has had no problem with medical control.  However, for the past 15 to 16 months, she has had worsening symptoms, been placed on double dose proton pump inhibitors and now is having breakthrough symptoms on that. A 24 hour pH study is positive. Manometry demonstrates normal esophageal motility. She has had 1 dilatation for an esophageal stricture. She presents now for laparoscopic Nissen fundoplication.  TECHNIQUE:  She is placed supine on the operating table and general anesthetic was administered. The abdomen was sterilely prepped and draped. A supraumbilical incision was made in the midline and placed in the skin sharply and carrying this down through the subcutaneous tissue until the fascia was identified and a 1 cm incision was made in the midline fascia. The peritoneal cavity was entered bluntly and under direct vision. A pursestring suture of #0 Vicryl was placed around the fascial edges. A Hasson trocar was introduced into the peritoneal cavity and the pneumoperitoneum created by insufflation of CO2 gas. Next, a 30 degree laparoscope was introduced.  Under direct vision, a 5 mm trocar was placed through a right mid  abdominal incision and a self retaining liver retractor was inserted through this and the left lobe of the liver was retracted anteriorly and superiorly. There was floppy left lobe of the liver. Next, I placed a 5 mm trocar in the right subcostal region and then a 10 mm trocar in the right subcostal region and one 5 mm trocar in the left subcostal region. The thin filmy area of the gastrohepatic ligament was incised up to the area of the right crus. Using blunt dissection, I separated the right crus and the diaphragm. I then performed a retroesophageal window using blunt dissection. I incised the peritoneum overlying the esophagus and onto the left crus. Next, I directed myself to the short gastric vessels. At the mid portion of the fundus, I began incising short gastric vessels all the way up. There were some very close attachments of the fundus to the spleen and I clipped the short gastric vessels and divided them with the harmonic scalpel. Once I had the fundus fully mobilized, I noted it was floppy.  I then put one stitch in the hiatus to close a very small hiatal hernia. The stitch was a zero nonabsorbable suture. I then passed the part of the fundus behind the esophagus to form the right leaf in the wrap. I was able to proximate another portion of the fundus to form the left leaf. Under direct vision, a wide edge size 50 bougie was inserted into the esophagus. A 2.5 cm wrap was then made over the esophagus. The first 2 stitches of the wrap incorporated the left leaf  of the esophagus and the right leaf of the wrap. The last suture was just from the right and left leaves of the wrap. The dilator was removed intact. The wrap was floppy and under no tension. It lie at approximately 11 oclock on the esophagus at its joining point.  Next, the right leaf of the wrap was anchored to the right crus of the diaphragm. This was done with a zero nonabsorbable suture. The first 2 sutures of the  wrap were then marked with hemoclips. The wrap again was inspected and the splenic area inspected and the liver area inspected and no bleeding was noted. There was approximately a loss of 50 to 100 cc of blood during the case. The liver retractor was removed and all the trocars were removed and a pneumoperitoneum released. The epigastric fascial defect was closed by tightening up and tying down the pursestring suture. The skin incisions were closed with 4-0 monocryl subcuticular sutures followed by Steri-Strips and sterile dressings.  The patient tolerated the procedure well without any apparent complications and was taken to the recovery room in satisfactory condition. DD:  08/12/00 TD:  08/12/00 Job: [id number]

## 2011-03-27 NOTE — Assessment & Plan Note (Signed)
[facility]                           PRIMARY CARE OFFICE NOTE   NAME:[name] [name], [name] [name]                      MRN:          [id number]  DATE:03/04/2007                            DOB:          1941-07-13    [name] [name] is a 70 year old woman well known to me who presents with  multiple complaints:  1. Positional vertigo. The patient reports that she will have a      problem with dizziness when she is doing activities such as bending      over or if she is standing and raising her arms over her head and      extending her neck, or when she first lays down. She reports that      if she takes her time laying down and positioning herself, her      vertigo is less intense.  2. Fatigue. The patient reports that she seems very fatigued and low      on energy more than usual and is concerned about metabolic      derangement.  3. Heartburn. The patient is status post hiatal hernia repair. She      reports that she is now having recurrent reflux and discomfort. She      does treat herself with Mylanta.  4. Dyspnea with exertion. The patient reports that she has had several      episodes over the last 4-6 months where she has been awakened by      the inability to catch her breath and feels her air is cut off. She      has now trained herself to remain calm, but she does seek cooler      air and deep breaths to recover. She also reports that she has had      several episodes that with exertion she will feel like she is      struggling. She also reports that with exertion she will have      diaphoresis and mild chest pressure.   PAST MEDICAL HISTORY:   SURGICAL:  1. Laparoscopic fundoplication 13/2440.  2. Benign breast tumor removed in the 1970s.  3. Water-filled cyst removed from her right hand on multiple      occasions.   MEDICAL:  1. Usual childhood diseases.  2. Pneumonia x3 as a child with a long standing history of frequent      bronchitis.  3.  Hyperthyroid disease.  4. Depression.  5. Hormone replacement.  6. Irritable bladder and atrophic vaginitis.  7. Hypothyroid disease.   FAMILY HISTORY:  Significant for her father dying at age 49 of a  myocardial infarction. There is an aunt with breast cancer, an aunt with  pancreatic cancer, and an uncle with lung cancer.   CURRENT MEDICATIONS:  1. Inderal 10 mg b.i.d. for tremor.  2. Klonopin 0.5 mg once or twice daily for tremor.  3. Omega 3 fatty acids.  4. Oxybutynin 5 mg b.i.d.  5. Synthroid 100 mcg daily.   REASON FOR ADMISSION:  Negative except for the history of present  illness.   PHYSICAL  EXAMINATION:  VITAL SIGNS:  Temperature 98.2, blood pressure  128/84, pulse 69, weight 138.  GENERAL:  This is a well developed, well nourished, mildly overweight  woman in no acute distress. 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  CHEST:  The patient is moving air well with no rales, wheezes, or  rhonchi.  CARDIOVASCULAR:  2+ radial pulse. No JVD or carotid bruits. She had a  regular rate and rhythm without murmurs.  ABDOMEN:  Soft, no guarding or rebound, no tenderness to deep palpation.   ASSESSMENT AND PLAN:  1. Dizziness. I suspect the patient's predominant symptom is related      to labyrinthitis. Remote possibility of VBI. PLAN:  Trial of      Meclizine 12.5 mg 2 or 3 times daily, as long as she is having      problems with dizziness.  2. Fatigue. The patient with unusual fatigue by her report. She does      remain very busy. PLAN: Laboratory to rule out metabolic      abnormality, including TSH, 3T4, and CBC.  3. GI. The patient has had repair of a hiatal hernia and is now having      recurrent reflux. PLAN:  Start Ranitidine 150 mg b.i.d.  4. Cardiovascular. The patient with unusual shortness of breath at      night, also with dyspnea on exertion, diaphoresis, and mild      discomfort. She does have a positive family history for heart      disease, although her other risk  factors are minimal, except for      being post-menopausal, off hormones. She has had elevated lipids in      the past with most recent lipid profile from 02/17/06 with a      cholesterol of 229, HDL 44.9, and LDL of 158. Given the patient's      positive family history and her risk profile, I believe that she      would benefit from a stress cardiac nuclear study to rule out      obstructive coronary disease. This is will be arranged through the      office and she will be notified of timing. The patient will be      notified of her lab results by phone tree.     [name], MD  Electronically Signed    MEN/MedQ  DD: 03/04/2007  DT: 03/05/2007  Job #: [id number]   cc:   Albertina Parr

## 2011-03-27 NOTE — Consult Note (Signed)
[place]  Patient:    [name] [name], [name] [name]                      MRN: [id number] Proc. Date: 05/05/00 Adm. Date:  [id number] Disc. Date: [id number] Attending:  [place] Cellar                          Consultation Report  ESOPHAGEAL MANOMETRY REPORT  HISTORY:  [name] has had persistent pyrosis.  Test is performed for further evaluation.  She has had ______ despite medicine with H2 receptor antagonist.  The esophageal manometry was performed in the usual pull-through fashion.  FINDINGS: 1. Upper esophageal sphincter pressure, contractions and relaxation was    normal. 2. Throughout the esophageal body there was . Normal peristaltic contractions    throughout the body of the esophagus (100% peristaltic contractions). 3. A high resting LES pressure and 75.9 mmHg with relaxation was normal.  IMPRESSION:  Normal esophageal manometry (a high LES pressure). DD:  05/05/00 TD:  05/06/00 Job: [id number]

## 2011-04-04 ENCOUNTER — Other Ambulatory Visit: Payer: Self-pay | Admitting: Internal Medicine

## 2011-04-07 ENCOUNTER — Other Ambulatory Visit: Payer: Self-pay | Admitting: *Deleted

## 2011-04-07 MED ORDER — LEVOTHYROXINE SODIUM 100 MCG PO TABS: 100.0000 ug | ORAL_TABLET | Freq: Every day | ORAL | Status: DC

## 2011-05-15 ENCOUNTER — Other Ambulatory Visit: Payer: Self-pay | Admitting: *Deleted

## 2011-05-15 MED ORDER — ROSUVASTATIN CALCIUM 20 MG PO TABS: 20.0000 mg | ORAL_TABLET | Freq: Every day | ORAL | Status: DC

## 2011-05-21 ENCOUNTER — Other Ambulatory Visit: Payer: Self-pay | Admitting: Neurology

## 2011-05-21 DIAGNOSIS — E538 Deficiency of other specified B group vitamins: Secondary | ICD-10-CM

## 2011-05-21 DIAGNOSIS — G959 Disease of spinal cord, unspecified: Secondary | ICD-10-CM

## 2011-05-23 ENCOUNTER — Other Ambulatory Visit: Payer: Self-pay | Admitting: Internal Medicine

## 2011-05-26 ENCOUNTER — Other Ambulatory Visit: Payer: Self-pay | Admitting: *Deleted

## 2011-05-26 ENCOUNTER — Ambulatory Visit
Admission: RE | Admit: 2011-05-26 | Discharge: 2011-05-26 | Disposition: A | Discharge status: Home / Self Care | Payer: Medicare Other | Source: Ambulatory Visit | Attending: Neurology | Admitting: Neurology

## 2011-05-26 DIAGNOSIS — G959 Disease of spinal cord, unspecified: Secondary | ICD-10-CM

## 2011-05-26 DIAGNOSIS — E538 Deficiency of other specified B group vitamins: Secondary | ICD-10-CM

## 2011-05-26 MED ORDER — SERTRALINE HCL 25 MG PO TABS: 25.0000 mg | ORAL_TABLET | Freq: Every day | ORAL | Status: DC

## 2011-05-27 ENCOUNTER — Telehealth: Payer: Self-pay | Admitting: *Deleted

## 2011-05-27 ENCOUNTER — Encounter: Payer: Self-pay | Admitting: Physician Assistant

## 2011-05-27 ENCOUNTER — Ambulatory Visit (INDEPENDENT_AMBULATORY_CARE_PROVIDER_SITE_OTHER): Payer: Medicare Other | Admitting: Physician Assistant

## 2011-05-27 VITALS — BP 136/79 | HR 104 | Resp 93 | Ht 62.5 in | Wt 145.0 lb

## 2011-05-27 DIAGNOSIS — E039 Hypothyroidism, unspecified: Secondary | ICD-10-CM

## 2011-05-27 DIAGNOSIS — F329 Major depressive disorder, single episode, unspecified: Secondary | ICD-10-CM

## 2011-05-27 DIAGNOSIS — I2510 Atherosclerotic heart disease of native coronary artery without angina pectoris: Secondary | ICD-10-CM

## 2011-05-27 DIAGNOSIS — R5383 Other fatigue: Secondary | ICD-10-CM

## 2011-05-27 DIAGNOSIS — K219 Gastro-esophageal reflux disease without esophagitis: Secondary | ICD-10-CM

## 2011-05-27 DIAGNOSIS — G252 Other specified forms of tremor: Secondary | ICD-10-CM

## 2011-05-27 DIAGNOSIS — R42 Dizziness and giddiness: Secondary | ICD-10-CM

## 2011-05-27 DIAGNOSIS — R011 Cardiac murmur, unspecified: Secondary | ICD-10-CM

## 2011-05-27 DIAGNOSIS — R110 Nausea: Secondary | ICD-10-CM

## 2011-05-27 LAB — URINALYSIS, ROUTINE W REFLEX MICROSCOPIC
Nitrite: NEGATIVE
Urine Glucose: NEGATIVE
Urobilinogen, UA: 0.2 (ref 0.0–1.0)

## 2011-05-27 LAB — BASIC METABOLIC PANEL
BUN: 18 mg/dL (ref 6–23)
GFR: 115.78 mL/min (ref >60.00)
Glucose, Bld: 124 mg/dL — ABNORMAL HIGH (ref 70–99)
Potassium: 4 mEq/L (ref 3.5–5.1)

## 2011-05-27 LAB — CBC WITH DIFFERENTIAL/PLATELET
Basophils Absolute: 0 10*3/uL (ref 0.0–0.1)
Eosinophils Absolute: 0 10*3/uL (ref 0.0–0.7)
Lymphocytes Relative: 21 % (ref 12.0–46.0)
MCHC: 33.7 g/dL (ref 30.0–36.0)
Neutrophils Relative %: 68.1 % (ref 43.0–77.0)
RDW: 13.6 % (ref 11.5–14.6)

## 2011-05-27 LAB — HEPATIC FUNCTION PANEL
ALT: 22 U/L (ref 0–35)
AST: 19 U/L (ref 0–37)
Albumin: 3.9 g/dL (ref 3.5–5.2)

## 2011-05-27 MED ORDER — FAMOTIDINE 20 MG PO TABS: 20.0000 mg | ORAL_TABLET | Freq: Two times a day (BID) | ORAL | Status: DC

## 2011-05-27 NOTE — Assessment & Plan Note (Signed)
Workup as noted.  Question if recent URI may be playing a role as well.

## 2011-05-27 NOTE — Progress Notes (Signed)
History of Present Illness: Primary Cardiologist:  Dr.  Bing Quarry  [name] [name] is a 70 y.o. female with a history of nonobstructive CAD at cardiac catheterization in May 2008.  She had a myoview study prior to this with positive ECG changes but no scar or ischemia on her nuclear images.  There is a remote history of a transesophageal echocardiogram in 1993 that demonstrated normal ejection fraction.  Her other medical history also includes hyperlipidemia, hypothyroidism, asthma, GERD, depression and essential tremor which is followed by Dr. Erling Cruz.  She was last seen by Dr. Lia Foyer in April.    She presents today with several complaints.  She notes a several day history of nausea and lightheadedness.  She recorded blood pressures that are much higher for her in the 160s/80s.  Her heart rate has also been up in the low 100s.  She took extra propranolol and Imdur.  The next day, her blood pressures were down in the 90s/50s.  She did feel weak with this.  She has been extremely fatigued.  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  She also cares for her daughter who has multiple sclerosis.  She had a recent URI.  She took over-the-counter Coricidin.  She's had some otalgia.  She notes some chills.  She notes a cough that is nonproductive.  She denies any weight loss or night sweats.  She does have a history of frequent UTIs.  She denies any recent dysuria, hematuria or incontinence.  She denies vomiting or diarrhea.  She denies chest pain.  She does have dyspnea with exertion.  She thinks that this may be worse over the last several months.  She denies orthopnea, PND or edema.  She denies syncope.  She denies pleuritic chest pain.  She denies hemoptysis.  She has not had any recent travels.  Past Medical History  Diagnosis Date  . GERD (gastroesophageal reflux disease)   . Familial tremor     followed by Dr. Erling Cruz  . Unspecified chronic bronchitis   . Vaginitis   . Irritable  bladder   . Pneumonia   . Hypothyroidism   . Depression   . Asthma   . CAD (coronary artery disease)     myoview 5/08:  EF 76%, no scar, no ischemia, +ECG changes with exercise;  cath 04/08/07:  pLAD 30%, mLAD 60-70% - med tx.  Marland Kitchen HLD (hyperlipidemia)     Current Outpatient Prescriptions  Medication Sig Dispense Refill  . albuterol (PROAIR HFA) 108 (90 BASE) MCG/ACT inhaler Inhale 2 puffs into the lungs every 6 (six) hours as needed.        . beclomethasone (QVAR) 80 MCG/ACT inhaler Inhale 1 puff into the lungs as needed.        . Calcium Carbonate-Vitamin D (CALCIUM-VITAMIN D) 500-200 MG-UNIT per tablet Take 1 tablet by mouth daily.        . clonazePAM (KLONOPIN) 0.5 MG tablet Take 0.5 mg by mouth daily.        . cyanocobalamin (,VITAMIN B-12,) 1000 MCG/ML injection Inject 1,000 mcg into the muscle every 30 (thirty) days.        . cyanocobalamin 1000 MCG tablet Take 100 mcg by mouth every 30 (thirty) days.        . isosorbide mononitrate (IMDUR) 30 MG 24 hr tablet 1/2 tab po qd       . levothyroxine (SYNTHROID, LEVOTHROID) 100 MCG tablet Take 1 tablet (100 mcg total) by mouth  daily.  30 tablet  6  . mometasone (NASONEX) 50 MCG/ACT nasal spray 2 sprays by Nasal route as needed.       . nitroGLYCERIN (NITROSTAT) 0.4 MG SL tablet Place 0.4 mg under the tongue every 5 (five) minutes as needed.        . propranolol (INDERAL) 10 MG tablet       . rosuvastatin (CRESTOR) 20 MG tablet Take 1 tablet (20 mg total) by mouth daily.  30 tablet  8  . sertraline (ZOLOFT) 25 MG tablet Take 1 tablet (25 mg total) by mouth daily.  30 tablet  11  . trimethoprim (TRIMPEX) 100 MG tablet Take 100 mg by mouth daily.          Allergies: No Known Allergies  Social history:  Nonsmoker  ROS:  See history of present illness.  All other systems reviewed and negative.  Vital Signs: BP 136/79  Pulse 104  Resp 93  Ht 5' 2.5" (1.588 m)  Wt 135 lb (61.236 kg)  BMI 24.30 kg/m2  Lying BP 124/75, HR 95  Filed  Vitals:   05/27/11 0947 05/27/11 0949 05/27/11 0950 05/27/11 0952  BP: 119/73 126/78 136/81 136/79  Pulse: 94 93 101 104  Resp:      Height:      Weight:        PHYSICAL EXAM: Well nourished, well developed, in no acute distress HEENT: normal, Bilateral TMs are clear, oropharynx is pink Neck: no JVD Lymph: No cervical adenopathy Vascular:No carotid bruits Endocrine:No thyromegaly Cardiac:  normal S1, S2; RRR; 2/6 systolic murmurRUSB Lungs:  clear to auscultation bilaterally, no wheezing, rhonchi or rales Abd: soft, nontender, no hepatomegaly Ext: no edema Skin: warm and dry Neuro:  CNs 2-12 intact, no focal abnormalities noted Psych:Normal affect  EKG:  Normal sinus rhythm, heart rate 93, normal axis, nonspecific ST-T wave changes, no significant change when compared to prior tracing dated 02/24/11  ASSESSMENT AND PLAN:

## 2011-05-27 NOTE — Assessment & Plan Note (Signed)
Her symptoms are nonspecific.  She is having some nausea as well as dyspnea with exertion.  She did have a 60-70% mid LAD lesion in 2008.  She has not been assessed for ischemia since that time.  I will set her up for a Lexiscan Myoview.  She will follow up in the next couple of weeks.

## 2011-05-27 NOTE — Patient Instructions (Signed)
Your physician recommends that you schedule a follow-up appointment in: 06/15/11 @ 3:00 pm to see Dr. Lia Foyer as per Richardson Dopp, PA-C  Your physician has requested that you have a lexiscan myoview dx fatigue and CAD. For further information please visit [email or web address]. Please follow instruction sheet, as given.  Your physician has requested that you have an echocardiogram dx  Murmur. Echocardiography is a painless test that uses sound waves to create images of your heart. It provides your doctor with information about the size and shape of your heart and how well your heart's chambers and valves are working. This procedure takes approximately one hour. There are no restrictions for this procedure.   Your physician has recommended you make the following change in your medication: START PEPCID 20 MG 1 TABLET TWICE DAILY  Your physician recommends that you return for lab work in: TODAY U/A, BMET, LFT'S, TSH, CBC W/DIFF

## 2011-05-27 NOTE — Assessment & Plan Note (Signed)
As noted, her symptoms are nonspecific.  Possible etiologies include infection, cardiac, stress, sequelae of her neurologic disorder, Recurrent urinary tract infection, GERD.  Proceed with Myoview as noted.  Check labs: Basic metabolic panel, LFTs, CBC, TSH, urinalysis.

## 2011-05-27 NOTE — Assessment & Plan Note (Signed)
Possibly playing a role.  If cardiac workup negative, followup with PCP.

## 2011-05-27 NOTE — Telephone Encounter (Signed)
Pt aware of lab results today. [name] [name]

## 2011-05-27 NOTE — Assessment & Plan Note (Signed)
Check TSH 

## 2011-05-27 NOTE — Assessment & Plan Note (Signed)
She is having lots of belching.  I question whether or not acid reflux is playing a role in some of her nausea.  Start Pepcid 20 mg twice a day.

## 2011-05-27 NOTE — Assessment & Plan Note (Signed)
Schedule echocardiogram

## 2011-05-27 NOTE — Assessment & Plan Note (Signed)
She takes her propranolol fairly consistently.  I do not think that missing dosages of this has caused a problem with erratic heart rate.  In any event, I have asked her to try to take her propranolol twice a day everyday on a consistent basis.

## 2011-05-29 ENCOUNTER — Encounter: Payer: Self-pay | Admitting: *Deleted

## 2011-05-29 ENCOUNTER — Ambulatory Visit: Payer: Medicare Other | Admitting: Physician Assistant

## 2011-06-03 ENCOUNTER — Ambulatory Visit (HOSPITAL_COMMUNITY): Payer: Medicare Other | Attending: Cardiology | Admitting: Radiology

## 2011-06-03 DIAGNOSIS — I2510 Atherosclerotic heart disease of native coronary artery without angina pectoris: Secondary | ICD-10-CM | POA: Insufficient documentation

## 2011-06-03 DIAGNOSIS — I079 Rheumatic tricuspid valve disease, unspecified: Secondary | ICD-10-CM | POA: Insufficient documentation

## 2011-06-03 DIAGNOSIS — R5383 Other fatigue: Secondary | ICD-10-CM

## 2011-06-03 DIAGNOSIS — E7800 Pure hypercholesterolemia, unspecified: Secondary | ICD-10-CM | POA: Insufficient documentation

## 2011-06-03 DIAGNOSIS — R011 Cardiac murmur, unspecified: Secondary | ICD-10-CM

## 2011-06-03 DIAGNOSIS — R0789 Other chest pain: Secondary | ICD-10-CM

## 2011-06-03 DIAGNOSIS — R0602 Shortness of breath: Secondary | ICD-10-CM

## 2011-06-03 DIAGNOSIS — R5381 Other malaise: Secondary | ICD-10-CM | POA: Insufficient documentation

## 2011-06-03 DIAGNOSIS — I059 Rheumatic mitral valve disease, unspecified: Secondary | ICD-10-CM | POA: Insufficient documentation

## 2011-06-03 MED ORDER — TECHNETIUM TC 99M TETROFOSMIN IV KIT
33.0000 | PACK | Freq: Once | INTRAVENOUS | Status: AC | PRN
  Administered 2011-06-03: 33 via INTRAVENOUS

## 2011-06-03 MED ORDER — TECHNETIUM TC 99M TETROFOSMIN IV KIT
11.0000 | PACK | Freq: Once | INTRAVENOUS | Status: AC | PRN
  Administered 2011-06-03: 11 via INTRAVENOUS

## 2011-06-03 MED ORDER — REGADENOSON 0.4 MG/5ML IV SOLN
0.4000 mg | Freq: Once | INTRAVENOUS | Status: AC
  Administered 2011-06-03: 0.4 mg via INTRAVENOUS

## 2011-06-03 NOTE — Progress Notes (Addendum)
[place] [address] [phone]  Cardiology Nuclear Med Study  [name] [name] is a 70 y.o. female [id number] Mar 30, 1941   Nuclear Med Background Indication for Stress Test:  Evaluation for Ischemia History:  Asthma, '93 TEE Echo: NL LVF, 05/08 Heart Catheterization: N/O DZ and 05/08 Myocardial Perfusion Study: (+) EKG without ischemia Cardiac Risk Factors: Lipids  Symptoms:  Dizziness, DOE, Fatigue, Light-Headedness, Nausea, Palpitations and SOB   Nuclear Pre-Procedure Caffeine/Decaff Intake:  None NPO After: 8:00am   Lungs:  clear IV 0.9% NS with Angio Cath:  20g  IV Site: R Antecubital  IV Started by:  Crissie Figures, RN  Chest Size (in):  36 Cup Size: B  Height: 5' 2"  (1.575 m)  Weight:  145 lb (65.772 kg)  BMI:  Body mass index is 26.52 kg/(m^2). Tech Comments:  Patient held Inderal    Nuclear Med Study 1 or 2 day study: 1 day  Stress Test Type:  Treadmill/Lexiscan  Reading MD: Mertie Moores, MD  Order Authorizing Provider:  Dr. Lowella Dell  Resting Radionuclide: Technetium 58mTetrofosmin  Resting Radionuclide Dose: 10.9 mCi   Stress Radionuclide:  Technetium 966metrofosmin  Stress Radionuclide Dose: 33.0 mCi           Stress Protocol Rest HR: 75 Stress HR: 133  Rest BP: 143/78 Stress BP: 164/73  Exercise Time (min): n/a METS: n/a   Predicted Max HR: 149 bpm % Max HR: 89.26 bpm Rate Pressure Product: 21812   Dose of Adenosine (mg):  n/a Dose of Lexiscan: 0.4 mg  Dose of Atropine (mg): n/a Dose of Dobutamine: n/a mcg/kg/min (at max HR)  Stress Test Technologist: SaPerrin MalteseEMT-P  Nuclear Technologist:  StCharlton AmorCNMT     Rest Procedure:  Myocardial perfusion imaging was performed at rest 45 minutes following the intravenous administration of Technetium 9938mtrofosmin. Rest ECG: NSR  Stress Procedure:   The patient received IV Lexiscan 0.4 mg over 15-seconds with concurrent low  level exercise and then Technetium 54m19mrofosmin was injected at 30-seconds while the patient continued walking one more minute.  There were no significant changes and rare pac/pvc with Lexiscan.  Quantitative spect images were obtained after a 45-minute delay. Stress ECG: No significant change from baseline ECG  QPS Raw Data Images:  Normal; no motion artifact; normal heart/lung ratio. Stress Images:  Normal homogeneous uptake in all areas of the myocardium. Rest Images:  Normal homogeneous uptake in all areas of the myocardium. Subtraction (SDS):  Normal Transient Ischemic Dilatation (Normal <1.22):  1.16 Lung/Heart Ratio (Normal <0.45):  0.39  Quantitative Gated Spect Images QGS EDV:  41 ml QGS ESV:  6 ml QGS cine images:  NL LV Function; NL Wall Motion QGS EF: 85%  Impression Exercise Capacity:  Lexiscan with no exercise. BP Response:  Normal blood pressure response. Clinical Symptoms:  No chest pain. ECG Impression:  No significant ST segment change suggestive of ischemia. Comparison with Prior Nuclear Study: No images to compare  Overall Impression:  Normal stress nuclear study.  No evidence of ischemia.  Normal LV function.    PhilThayer Headings.,Brooke BonitoD, FACCGuam Regional Medical City  This study was reviewed with the patient during her office visit.  TS

## 2011-06-04 ENCOUNTER — Telehealth: Payer: Self-pay | Admitting: Cardiology

## 2011-06-04 NOTE — Telephone Encounter (Signed)
Patient had questions regarding her stress test results. She was concerned regarding the blockages in her heart from a previous cardiac catheterization and since her stress test was normal, was wondering if the blockages had improved. I informed her that she may have good collateral blood flow leading to her normal stress test. She is feeling ok and does not have any further questions regarding her stress test. She will discuss this further with Dr. Lia Foyer at her next OV.

## 2011-06-04 NOTE — Progress Notes (Addendum)
nuc med report routed to Dr. Lia Foyer & Richardson Dopp PA 06/04/11 [name] [name]    Patient has been notified that her stress test is normal. Richardson Dopp, PA-C

## 2011-06-04 NOTE — Telephone Encounter (Signed)
Pt was called yesterday with test results and has a couple questions

## 2011-06-08 ENCOUNTER — Other Ambulatory Visit: Payer: Self-pay | Admitting: Internal Medicine

## 2011-06-08 DIAGNOSIS — Z1231 Encounter for screening mammogram for malignant neoplasm of breast: Secondary | ICD-10-CM

## 2011-06-15 ENCOUNTER — Encounter: Payer: Self-pay | Admitting: Cardiology

## 2011-06-15 ENCOUNTER — Ambulatory Visit (INDEPENDENT_AMBULATORY_CARE_PROVIDER_SITE_OTHER): Payer: Medicare Other | Admitting: Cardiology

## 2011-06-15 ENCOUNTER — Ambulatory Visit
Admission: RE | Admit: 2011-06-15 | Discharge: 2011-06-15 | Disposition: A | Discharge status: Home / Self Care | Payer: Medicare Other | Source: Ambulatory Visit | Attending: Internal Medicine | Admitting: Internal Medicine

## 2011-06-15 DIAGNOSIS — Z1231 Encounter for screening mammogram for malignant neoplasm of breast: Secondary | ICD-10-CM

## 2011-06-15 DIAGNOSIS — G250 Essential tremor: Secondary | ICD-10-CM

## 2011-06-15 DIAGNOSIS — E7800 Pure hypercholesterolemia, unspecified: Secondary | ICD-10-CM

## 2011-06-15 DIAGNOSIS — G252 Other specified forms of tremor: Secondary | ICD-10-CM

## 2011-06-15 DIAGNOSIS — I2510 Atherosclerotic heart disease of native coronary artery without angina pectoris: Secondary | ICD-10-CM

## 2011-06-15 NOTE — Patient Instructions (Signed)
Your physician recommends that you schedule a follow-up appointment in: 3 months.  

## 2011-06-15 NOTE — Progress Notes (Signed)
HPI:  Mr. [name] [name] is in for follow up.  We had a long discussion today with her and her husband.  Her husband has some concerns that she does not have a diagnosis, has had extensive neurologic testing, and is continuing to have symptoms that are worsening.  Her steadiness and strength on her feet have declined, and she has fallen several times.  Starting and stopping statins had virtually no impact on her, and Dr. Erling Cruz gave her the green light to resume.  She has had an enormous work up.  The patient was satisfied with what had been done, but her husband is not so.  I encouraged him to speak with Dr. Erling Cruz at her next office visit.  Cardiac wise she seems stable. She has been treated medically, with no evidence of progression of symtpoms.  She had recent evaluation in our office, and we reviewed the extent of that testing here.  She is under tremendous stress.  Her daughter has MS, and is going through a divorce, and she is living with them, with some conflict it seems.  Her other is also sick.  Her husband and the patient agree that she is under an enormous amount of stress.  Her R leg is a bit weaker.  She recently saw Richardson Dopp, and underwent radionuclide imaging.  She recently presented with a variety of isses, including cough, sweats, variable BP, and Scott decided to get a nuc study on her.    Current Outpatient Prescriptions  Medication Sig Dispense Refill  . albuterol (PROAIR HFA) 108 (90 BASE) MCG/ACT inhaler Inhale 2 puffs into the lungs every 6 (six) hours as needed.        . beclomethasone (QVAR) 80 MCG/ACT inhaler Inhale 1 puff into the lungs as needed.        . Calcium Carbonate-Vitamin D (CALCIUM-VITAMIN D) 500-200 MG-UNIT per tablet Take 1 tablet by mouth daily.        . clonazePAM (KLONOPIN) 0.5 MG tablet Take 0.5 mg by mouth daily.        . cyanocobalamin (,VITAMIN B-12,) 1000 MCG/ML injection Inject 1,000 mcg into the muscle every 30 (thirty) days.        . famotidine (PEPCID) 20 MG  tablet Take 1 tablet (20 mg total) by mouth 2 (two) times daily.  60 tablet  6  . isosorbide mononitrate (IMDUR) 30 MG 24 hr tablet 1/2 tab po qd       . levothyroxine (SYNTHROID, LEVOTHROID) 100 MCG tablet Take 1 tablet (100 mcg total) by mouth daily.  30 tablet  6  . mometasone (NASONEX) 50 MCG/ACT nasal spray 2 sprays by Nasal route as needed.       . nitroGLYCERIN (NITROSTAT) 0.4 MG SL tablet Place 0.4 mg under the tongue every 5 (five) minutes as needed.        . propranolol (INDERAL) 10 MG tablet Take 10 mg by mouth as needed.       . rosuvastatin (CRESTOR) 20 MG tablet Take 1 tablet (20 mg total) by mouth daily.  30 tablet  8  . sertraline (ZOLOFT) 25 MG tablet Take 1 tablet (25 mg total) by mouth daily.  30 tablet  11  . trimethoprim (TRIMPEX) 100 MG tablet Take 100 mg by mouth daily.          No Known Allergies  Past Medical History  Diagnosis Date  . GERD (gastroesophageal reflux disease)   . Familial tremor     followed by Dr. Erling Cruz  .  Unspecified chronic bronchitis   . Vaginitis   . Irritable bladder   . Pneumonia   . Hypothyroidism   . Depression   . Asthma   . CAD (coronary artery disease)     myoview 5/08:  EF 76%, no scar, no ischemia, +ECG changes with exercise;  cath 04/08/07:  pLAD 30%, mLAD 60-70% - med tx.  Marland Kitchen HLD (hyperlipidemia)     Past Surgical History  Procedure Date  . Laparoscopic nissen fundoplication   . Cystectomy   . Tumor removal   . Tubal ligation     Family History  Problem Relation Age of Onset  . Coronary artery disease    . Heart attack    . Hypertension    . Diabetes    . Breast cancer      History   Social History  . Marital Status: Married    Spouse Name: N/A    Number of Children: N/A  . Years of Education: N/A   Occupational History  . Not on file.   Social History Main Topics  . Smoking status: Never Smoker   . Smokeless tobacco: Not on file  . Alcohol Use: No  . Drug Use: No  . Sexually Active: Not on file    Other Topics Concern  . Not on file   Social History Narrative  . No narrative on file    ROS: Please see the HPI.  All other systems reviewed and negative.  PHYSICAL EXAM:  BP 114/65  Pulse 78  Resp 16  Ht 5' 2"  (1.575 m)  Wt 147 lb (66.679 kg)  BMI 26.89 kg/m2  General: Well developed, well nourished, in no acute distress.  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   Head:  Normocephalic and atraumatic. Neck: no JVD Lungs: Clear to auscultation and percussion. Heart: Normal S1 and S2.  No murmur, rubs or gallops.  Pulses: Pulses normal in all 4 extremities. Extremities: No clubbing or cyanosis. No edema. Neurologic: Alert and oriented x 3.  EKG:    NUCLEAR IMAGING:QGS EDV: 41 ml  QGS ESV: 6 ml  QGS cine images: NL LV Function; NL Wall Motion  QGS EF: 85%  Impression  Exercise Capacity: Lexiscan with no exercise.  BP Response: Normal blood pressure response.  Clinical Symptoms: No chest pain.  ECG Impression: No significant ST segment change suggestive of ischemia.  Comparison with Prior Nuclear Study: No images to compare  Overall Impression: Normal stress nuclear study. No evidence of ischemia. Normal LV function.  Thayer Headings, Brooke Bonito., MD, [facility]   06/03/2011    ASSESSMENT AND PLAN:

## 2011-06-16 NOTE — Assessment & Plan Note (Signed)
She appears stable from a cardiac standpoint.  Continued medical therapy is warranted at the present time.  Her perfusion scan is unchanged, with no significant defect despite known LAD disease.  She remains on medical therapy.

## 2011-06-16 NOTE — Assessment & Plan Note (Signed)
Her last tests were excellent.  Continue medical therapy.

## 2011-06-16 NOTE — Assessment & Plan Note (Signed)
I have encouraged her to continue to follow up with Dr. Erling Cruz and to have her husband accompany her to those visits.

## 2011-06-17 ENCOUNTER — Telehealth: Payer: Self-pay | Admitting: *Deleted

## 2011-06-17 NOTE — Telephone Encounter (Signed)
Fax from [facility] [phone]. Refill for Clonazepam 0.5 mg tablet QTY 60 SIG take 1-2 tablets by mouth daily. Last written 01/02/2011 QTY60 with 5 refills. Last office visit was 06/09/2010, Please advise refills

## 2011-06-18 MED ORDER — CLONAZEPAM 0.5 MG PO TABS: 0.5000 mg | ORAL_TABLET | Freq: Every day | ORAL | Status: DC

## 2011-06-18 NOTE — Telephone Encounter (Signed)
Has been seeing Dr. Erling Cruz. OK for refill x 5

## 2011-06-18 NOTE — Telephone Encounter (Signed)
Pt is seeing Dr Erling Cruz

## 2011-06-22 ENCOUNTER — Other Ambulatory Visit: Payer: Self-pay | Admitting: Cardiology

## 2011-06-22 ENCOUNTER — Ambulatory Visit: Payer: Medicare Other | Attending: Neurology | Admitting: Physical Therapy

## 2011-06-22 DIAGNOSIS — R269 Unspecified abnormalities of gait and mobility: Secondary | ICD-10-CM | POA: Insufficient documentation

## 2011-06-22 DIAGNOSIS — M6281 Muscle weakness (generalized): Secondary | ICD-10-CM | POA: Insufficient documentation

## 2011-06-22 DIAGNOSIS — IMO0001 Reserved for inherently not codable concepts without codable children: Secondary | ICD-10-CM | POA: Insufficient documentation

## 2011-06-23 ENCOUNTER — Ambulatory Visit: Payer: Medicare Other | Admitting: Physical Therapy

## 2011-06-24 ENCOUNTER — Other Ambulatory Visit: Payer: Self-pay | Admitting: Cardiology

## 2011-06-24 NOTE — Telephone Encounter (Signed)
[facility] [address] needs refill on Imdur.  Please call them with refills

## 2011-06-25 MED ORDER — ISOSORBIDE MONONITRATE ER 30 MG PO TB24: ORAL_TABLET | ORAL | Status: DC

## 2011-06-30 ENCOUNTER — Ambulatory Visit: Payer: Medicare Other | Admitting: Physical Therapy

## 2011-07-02 ENCOUNTER — Ambulatory Visit: Payer: Medicare Other | Admitting: Physical Therapy

## 2011-07-06 ENCOUNTER — Ambulatory Visit: Payer: Medicare Other | Admitting: Physical Therapy

## 2011-07-07 ENCOUNTER — Ambulatory Visit: Payer: Medicare Other | Admitting: Physical Therapy

## 2011-07-14 ENCOUNTER — Ambulatory Visit: Payer: Medicare Other | Attending: Neurology | Admitting: Physical Therapy

## 2011-07-14 DIAGNOSIS — M6281 Muscle weakness (generalized): Secondary | ICD-10-CM | POA: Insufficient documentation

## 2011-07-14 DIAGNOSIS — R269 Unspecified abnormalities of gait and mobility: Secondary | ICD-10-CM | POA: Insufficient documentation

## 2011-07-14 DIAGNOSIS — IMO0001 Reserved for inherently not codable concepts without codable children: Secondary | ICD-10-CM | POA: Insufficient documentation

## 2011-07-16 ENCOUNTER — Ambulatory Visit: Payer: Medicare Other | Admitting: Physical Therapy

## 2011-07-20 ENCOUNTER — Ambulatory Visit: Payer: Medicare Other | Admitting: Physical Therapy

## 2011-07-23 ENCOUNTER — Ambulatory Visit: Payer: Medicare Other | Admitting: Physical Therapy

## 2011-07-27 ENCOUNTER — Ambulatory Visit: Payer: Medicare Other | Admitting: Physical Therapy

## 2011-07-28 ENCOUNTER — Ambulatory Visit: Payer: Medicare Other | Admitting: Physical Therapy

## 2011-07-29 ENCOUNTER — Ambulatory Visit: Payer: Medicare Other | Admitting: *Deleted

## 2011-07-30 ENCOUNTER — Encounter: Payer: Medicare Other | Admitting: Internal Medicine

## 2011-07-30 ENCOUNTER — Ambulatory Visit: Payer: Medicare Other | Admitting: Physical Therapy

## 2011-08-03 ENCOUNTER — Ambulatory Visit: Payer: Medicare Other | Admitting: Physical Therapy

## 2011-08-05 LAB — COMPREHENSIVE METABOLIC PANEL
ALT: 17
BUN: 16
Calcium: 9.5
Glucose, Bld: 124 — ABNORMAL HIGH
Sodium: 142
Total Protein: 5.8 — ABNORMAL LOW

## 2011-08-05 LAB — CBC
Hemoglobin: 12.5
MCHC: 34.6
Platelets: 228
RDW: 12.8

## 2011-08-05 LAB — URINALYSIS, ROUTINE W REFLEX MICROSCOPIC
Glucose, UA: NEGATIVE
Leukocytes, UA: NEGATIVE
Protein, ur: NEGATIVE
pH: 7

## 2011-08-05 LAB — POCT I-STAT, CHEM 8
BUN: 18
Chloride: 105
Potassium: 3.8
Sodium: 140

## 2011-08-05 LAB — LIPASE, BLOOD: Lipase: 17

## 2011-08-06 ENCOUNTER — Ambulatory Visit: Payer: Medicare Other | Admitting: *Deleted

## 2011-08-06 ENCOUNTER — Ambulatory Visit: Payer: Medicare Other | Admitting: Physical Therapy

## 2011-08-10 ENCOUNTER — Ambulatory Visit: Payer: Medicare Other | Attending: Neurology | Admitting: Physical Therapy

## 2011-08-10 DIAGNOSIS — M6281 Muscle weakness (generalized): Secondary | ICD-10-CM | POA: Insufficient documentation

## 2011-08-10 DIAGNOSIS — R269 Unspecified abnormalities of gait and mobility: Secondary | ICD-10-CM | POA: Insufficient documentation

## 2011-08-10 DIAGNOSIS — IMO0001 Reserved for inherently not codable concepts without codable children: Secondary | ICD-10-CM | POA: Insufficient documentation

## 2011-08-13 ENCOUNTER — Ambulatory Visit: Payer: Medicare Other | Admitting: Physical Therapy

## 2011-08-17 ENCOUNTER — Ambulatory Visit: Payer: Medicare Other | Admitting: Physical Therapy

## 2011-08-19 ENCOUNTER — Ambulatory Visit: Payer: Medicare Other | Admitting: Physical Therapy

## 2011-08-24 ENCOUNTER — Encounter: Payer: Self-pay | Admitting: Cardiology

## 2011-08-24 ENCOUNTER — Ambulatory Visit (INDEPENDENT_AMBULATORY_CARE_PROVIDER_SITE_OTHER): Payer: Medicare Other | Admitting: Cardiology

## 2011-08-24 ENCOUNTER — Ambulatory Visit (INDEPENDENT_AMBULATORY_CARE_PROVIDER_SITE_OTHER): Payer: Medicare Other | Admitting: Internal Medicine

## 2011-08-24 ENCOUNTER — Encounter: Payer: Self-pay | Admitting: Internal Medicine

## 2011-08-24 VITALS — BP 130/74 | HR 59 | Resp 18 | Ht 62.0 in | Wt 144.8 lb

## 2011-08-24 DIAGNOSIS — Z0000 Encounter for general adult medical examination without abnormal findings: Secondary | ICD-10-CM

## 2011-08-24 DIAGNOSIS — J45909 Unspecified asthma, uncomplicated: Secondary | ICD-10-CM

## 2011-08-24 DIAGNOSIS — E7800 Pure hypercholesterolemia, unspecified: Secondary | ICD-10-CM

## 2011-08-24 DIAGNOSIS — G250 Essential tremor: Secondary | ICD-10-CM

## 2011-08-24 DIAGNOSIS — E039 Hypothyroidism, unspecified: Secondary | ICD-10-CM

## 2011-08-24 DIAGNOSIS — I2510 Atherosclerotic heart disease of native coronary artery without angina pectoris: Secondary | ICD-10-CM

## 2011-08-24 DIAGNOSIS — G252 Other specified forms of tremor: Secondary | ICD-10-CM

## 2011-08-24 DIAGNOSIS — I872 Venous insufficiency (chronic) (peripheral): Secondary | ICD-10-CM

## 2011-08-24 NOTE — Progress Notes (Signed)
Subjective:    Patient ID: [name] [name], female    DOB: [birth year], 70 y.o.   MRN: [id number]  HPI   The patient is here for annual Medicare wellness examination and management of other chronic and acute problems. In the interval has been followed by Dr. Erling Cruz. She was found to have generalized weakness in the arms and legs. CT, 4 MRI's, myelogram, LP, skull films and lots of blood work with no definitive diagnosis to explain the weakness. She has been in physical therapy - 15 treatments so far.   She had an episode of HTN and chest pain which lead to a myoview stress 06/03/11 with a normal result with no ischemia   The risk factors are reflected in the social history.  The roster of all physicians providing medical care to patient - is listed in the Snapshot section of the chart.  Activities of daily living:  The patient is 100% inedpendent in all ADLs: dressing, toileting, feeding as well as independent mobility  Home safety : The patient has smoke detectors in the home. They wear seatbelts.  firearms are present in the home, kept in a safe fashion. There is no violence in the home.   There is no risks for hepatitis, STDs or HIV. There is no   history of blood transfusion. They have no travel history to infectious disease endemic areas of the world.  The patient has not seen their dentist in the last six month. They have not seen their eye doctor in the last year. They admit to hearing difficulty and have not had audiologic testing in the last year. Has seen Dr. [name] and had tympanostomy tube right.They do not  have excessive sun exposure. Discussed the need for sun protection: hats, long sleeves and use of sunscreen if there is significant sun exposure.   Diet: the importance of a healthy diet is discussed. They do have a healty diet.  The patient has a regular exercise program: muscle stretching , 45 minute duration, 5 days per week.  The benefits of regular aerobic exercise were  discussed.  Depression screen: there are no signs or vegative symptoms of depression- irritability, change in appetite, anhedonia, sadness/tearfullness.  Cognitive assessment: the patient manages all their financial and personal affairs and is actively engaged.   The following portions of the patient's history were reviewed and updated as appropriate: allergies, current medications, past family history, past medical history,  past surgical history, past social history  and problem list.  Vision, hearing, body mass index were assessed and reviewed.   During the course of the visit the patient was educated and counseled about appropriate screening and preventive services including : fall prevention , diabetes screening, nutrition counseling, colorectal cancer screening, and recommended immunizations.  Has had a mammogram. Has not seen Gyn.   Past Medical History  Diagnosis Date  . GERD (gastroesophageal reflux disease)   . Familial tremor     followed by Dr. Erling Cruz  . Unspecified chronic bronchitis   . Vaginitis   . Irritable bladder   . Pneumonia   . Hypothyroidism   . Depression   . Asthma   . CAD (coronary artery disease)     myoview 5/08:  EF 76%, no scar, no ischemia, +ECG changes with exercise;  cath 04/08/07:  pLAD 30%, mLAD 60-70% - med tx.  Marland Kitchen HLD (hyperlipidemia)    Past Surgical History  Procedure Date  . Laparoscopic nissen fundoplication   . Cystectomy   . Tumor  removal   . Tubal ligation    Family History  Problem Relation Age of Onset  . Coronary artery disease    . Heart attack    . Hypertension    . Diabetes    . Breast cancer    . Hypertension Mother   . Heart disease Father    History   Social History  . Marital Status: Married    Spouse Name: N/A    Number of Children: N/A  . Years of Education: N/A   Occupational History  . Not on file.   Social History Main Topics  . Smoking status: Never Smoker   . Smokeless tobacco: Never Used  . Alcohol  Use: No  . Drug Use: No  . Sexually Active: Not Currently   Other Topics Concern  . Not on file   Social History Narrative   HSG, retired from [employer] . married '60. 1 son - '65; 1 daughter - '61; 2 grandchildren; 1 great-grandchild. Homemaker. marriage in good health. 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 Positive history of passive tobacco smoke exposure.       Review of Systems Constitutional:  Negative for fever, chills, activity change and unexpected weight change, but she has had weight gain due to lack of exercise  HEENT:  Positive for hearing loss. Negative ear pain, congestion, neck stiffness and postnasal drip. Negative for sore throat or swallowing problems. Negative for dental complaints.   Eyes: Negative for vision loss or change in visual acuity.  Respiratory: Negative for chest tightness and wheezing. Negative for DOE.   Cardiovascular: Positive for chest pain with negative myoview. No palpitations. No decreased exercise tolerance Gastrointestinal: No change in bowel habit. No bloating or gas. Positve reflux or indigestion relieved with H2 blocker.  Genitourinary: Negative for urgency, frequency, flank pain and difficulty urinating. Irritable bladder.  Musculoskeletal: Negative for myalgias, back pain, arthralgias and gait problem.  Neurological: Negative for dizziness and headaches. Has tremors -familial getting worse.  Hematological: Negative for adenopathy.  Psychiatric/Behavioral: Negative for behavioral problems and dysphoric mood.       Objective:   Physical Exam vitals noted - stable Gen'l - WNWD white woman in no acute distress HEENT - /AT, EAC's clear with tympanostomy tube in right TM. Oropharynx w/o lesions. C&S clear. Fundi w/ sharp disc margins, no vascular abnormality. Neck - supple, w/o thyromegaly Nodes - negative cervical or supraclavicular region Chest - no CVAT tenderness, no deformity Lungs - CTAP, w/o rales, wheezes or increased work of  breathing Cor - 2+ radial pulse, RRR without murmur, rub or gallop. Abd - soft, BS+, no guarding or rebound or epigastric tenderness Pelvic deferred Rectal deferred Extremities - no C/C/E, minor deformity of the PIP/DIP joints both hands. LE normal Neuro - resting tremor. CN II-XII grossly intact. Motor strength - moved to exam table with difficulty. Formal testing deferred to Dr. Sheran Spine - some bruising noted on hands.  Lab Results  Component Value Date   WBC 8.3 05/27/2011   HGB 12.3 05/27/2011   HCT 36.5 05/27/2011   PLT 266.0 05/27/2011   GLUCOSE 124* 05/27/2011   CHOL 118 03/20/2011   TRIG 52.0 03/20/2011   HDL 52.80 03/20/2011   LDLDIRECT 161.5 06/09/2010   LDLCALC 55 03/20/2011   ALT 22 05/27/2011   AST 19 05/27/2011   NA 141 05/27/2011   K 4.0 05/27/2011   CL 106 05/27/2011   CREATININE 0.6 05/27/2011   BUN 18 05/27/2011   CO2 28 05/27/2011  TSH 0.55 05/27/2011   INR 0.9 RATIO 04/07/2007           Assessment & Plan:

## 2011-08-24 NOTE — Patient Instructions (Signed)
Your physician wants you to follow-up in: 6 MONTHS.  You will receive a reminder letter in the mail two months in advance. If you don't receive a letter, please call our office to schedule the follow-up appointment.  Your physician recommends that you continue on your current medications as directed. Please refer to the Current Medication list given to you today.

## 2011-08-25 ENCOUNTER — Inpatient Hospital Stay: Admission: RE | Admit: 2011-08-25 | Payer: Medicare Other | Source: Ambulatory Visit

## 2011-08-25 ENCOUNTER — Ambulatory Visit: Payer: Medicare Other | Admitting: Physical Therapy

## 2011-08-25 NOTE — Progress Notes (Signed)
HPI:  [name] [name] is in for follow up.  She is doing pretty well.  Unfortunately, her daughter, who is single, had a big stroke and is in the rehab center across the street from our office.  This has been a pretty big stress.  Alternatively, the patient has also been in physical therapy, and has improved fairly immensely in terms of strength and ability to do things.  She denies any chest pain.  Getting along well overall.  No chest pain or progressive angina.  She is being managed medically.  Current Outpatient Prescriptions  Medication Sig Dispense Refill  . albuterol (PROAIR HFA) 108 (90 BASE) MCG/ACT inhaler Inhale 2 puffs into the lungs every 6 (six) hours as needed.        . beclomethasone (QVAR) 80 MCG/ACT inhaler Inhale 1 puff into the lungs as needed.        . Calcium Carbonate-Vitamin D (CALCIUM-VITAMIN D) 500-200 MG-UNIT per tablet Take 1 tablet by mouth daily.        . clonazePAM (KLONOPIN) 0.5 MG tablet Take 1 to 2 a day       . cyanocobalamin (,VITAMIN B-12,) 1000 MCG/ML injection Inject 1,000 mcg into the muscle every 30 (thirty) days.        . famotidine (PEPCID) 20 MG tablet Take 1 tablet (20 mg total) by mouth 2 (two) times daily.  60 tablet  6  . isosorbide mononitrate (IMDUR) 30 MG 24 hr tablet 1/2 tab po qd  30 tablet  1  . levothyroxine (SYNTHROID, LEVOTHROID) 100 MCG tablet Take 1 tablet (100 mcg total) by mouth daily.  30 tablet  6  . mometasone (NASONEX) 50 MCG/ACT nasal spray 2 sprays by Nasal route as needed.       . nitroGLYCERIN (NITROSTAT) 0.4 MG SL tablet Place 0.4 mg under the tongue every 5 (five) minutes as needed.        . propranolol (INDERAL) 10 MG tablet Take 10 mg by mouth as needed.       . rosuvastatin (CRESTOR) 20 MG tablet Take 1 tablet (20 mg total) by mouth daily.  30 tablet  8  . sertraline (ZOLOFT) 25 MG tablet Take 1 tablet (25 mg total) by mouth daily.  30 tablet  11  . trimethoprim (TRIMPEX) 100 MG tablet Take 100 mg by mouth daily.          No  Known Allergies  Past Medical History  Diagnosis Date  . GERD (gastroesophageal reflux disease)   . Familial tremor     followed by Dr. Erling Cruz  . Unspecified chronic bronchitis   . Vaginitis   . Irritable bladder   . Pneumonia   . Hypothyroidism   . Depression   . Asthma   . CAD (coronary artery disease)     myoview 5/08:  EF 76%, no scar, no ischemia, +ECG changes with exercise;  cath 04/08/07:  pLAD 30%, mLAD 60-70% - med tx.  Marland Kitchen HLD (hyperlipidemia)     Past Surgical History  Procedure Date  . Laparoscopic nissen fundoplication   . Cystectomy   . Tumor removal   . Tubal ligation     Family History  Problem Relation Age of Onset  . Coronary artery disease    . Heart attack    . Hypertension    . Diabetes    . Breast cancer    . Hypertension Mother   . Heart disease Father     History   Social History  .  Marital Status: Married    Spouse Name: N/A    Number of Children: N/A  . Years of Education: N/A   Occupational History  . Not on file.   Social History Main Topics  . Smoking status: Never Smoker   . Smokeless tobacco: Never Used  . Alcohol Use: No  . Drug Use: No  . Sexually Active: Not Currently   Other Topics Concern  . Not on file   Social History Narrative   HSG, retired from [employer] . married '60. 1 son - '65; 1 daughter - '61; 2 grandchildren; 1 great-grandchild. Homemaker. marriage in good health. 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 Positive history of passive tobacco smoke exposure.    ROS: Please see the HPI.  All other systems reviewed and negative.  PHYSICAL EXAM:  BP 130/74  Pulse 59  Resp 18  Ht 5' 2"  (1.575 m)  Wt 144 lb 12.8 oz (65.681 kg)  BMI 26.48 kg/m2  General: Well kept, not obese, female , in no acute distress. Head:  Normocephalic and atraumatic.  Slight head bob, less than in the past.  Neck: no JVD Lungs: Clear to auscultation and percussion. Heart: Normal S1 and S2.  No murmur, rubs or gallops.  Pulses: Pulses  normal in all 4 extremities. Extremities: No clubbing or cyanosis. No edema. Neurologic: Alert and oriented x 3.  Movement disorder is less pronounced.  EKG:  NSR.  WNL  ASSESSMENT AND PLAN:

## 2011-08-25 NOTE — Assessment & Plan Note (Signed)
She remains stable.  See overview for details.  No current angina on a medical regimen.  RTC in six months time.

## 2011-08-25 NOTE — Assessment & Plan Note (Signed)
LDL is 55 on Crestor.  We had stopped after evaluation by Dr. Erling Cruz, but he thought it was ok to restart.  Her current LDL is under control on medical therapy.

## 2011-08-26 DIAGNOSIS — Z0001 Encounter for general adult medical examination with abnormal findings: Secondary | ICD-10-CM | POA: Insufficient documentation

## 2011-08-26 NOTE — Assessment & Plan Note (Signed)
Lab Results  Component Value Date   TSH 0.55 05/27/2011   In normal range on present medication. With no physical change, no need to repeat lab.  Plan - continue present medications

## 2011-08-26 NOTE — Assessment & Plan Note (Signed)
No significant swelling at today's visit. She is aware of technique of management with support hose, elevation.

## 2011-08-26 NOTE — Assessment & Plan Note (Signed)
Last lipid panel July '12 revealed LDL better than goal. Tolerating crestor well.  Plan - continue present medications.

## 2011-08-26 NOTE — Assessment & Plan Note (Signed)
Interval hx significant for complex evaluation for progressive tremor and leg weakness direct by Dr. Erling Cruz. Her other medical problems have been stable. Physical exam - as noted. Labs from earlier in the year did not need to be repeated and were generally in normal limits. Colorectal cancer screening - need to locate most recent study. Current with breast health. Immunizations are up to date.   In summary - a very nice woman with several issues but appears to be medically stable. She has a wonderful attitude in regard to her health, her mothers condition and she seems to be coping with her daughter's medical misfortune. She is advised to continue her present regimen, to return to exercise as possible, with consideration for water aerobics. She will return as needed or in 6 months.

## 2011-08-26 NOTE — Assessment & Plan Note (Signed)
Mild progression. She has been consulting with Dr. Erling Cruz. Extensive work-up for weakness, especially legs. No definitive diagnosis. She is engaged in physical therapy for strengthening at neuro rehab

## 2011-08-26 NOTE — Assessment & Plan Note (Signed)
Very stable on her present regimen - maintenance steroid inhaler and break-through bronchodilator not being used more than several times a week.   Plan - continue present regimen

## 2011-08-28 ENCOUNTER — Ambulatory Visit (INDEPENDENT_AMBULATORY_CARE_PROVIDER_SITE_OTHER)
Admission: RE | Admit: 2011-08-28 | Discharge: 2011-08-28 | Disposition: A | Discharge status: Home / Self Care | Payer: Medicare Other | Source: Ambulatory Visit | Attending: Internal Medicine | Admitting: Internal Medicine

## 2011-08-28 ENCOUNTER — Other Ambulatory Visit: Payer: Self-pay | Admitting: Internal Medicine

## 2011-08-28 DIAGNOSIS — M8580 Other specified disorders of bone density and structure, unspecified site: Secondary | ICD-10-CM

## 2011-08-28 DIAGNOSIS — M949 Disorder of cartilage, unspecified: Secondary | ICD-10-CM

## 2011-08-31 ENCOUNTER — Ambulatory Visit: Payer: Medicare Other | Admitting: Physical Therapy

## 2011-09-03 ENCOUNTER — Ambulatory Visit: Payer: Medicare Other | Admitting: Physical Therapy

## 2011-09-07 ENCOUNTER — Ambulatory Visit: Payer: Medicare Other | Admitting: Physical Therapy

## 2011-09-08 ENCOUNTER — Encounter: Payer: Self-pay | Admitting: Internal Medicine

## 2011-09-09 ENCOUNTER — Encounter: Payer: Self-pay | Admitting: Internal Medicine

## 2011-09-10 ENCOUNTER — Ambulatory Visit: Payer: Medicare Other | Admitting: Physical Therapy

## 2011-09-15 ENCOUNTER — Ambulatory Visit: Payer: Medicare Other | Attending: Neurology | Admitting: Physical Therapy

## 2011-09-15 ENCOUNTER — Ambulatory Visit: Payer: Medicare Other | Admitting: Cardiology

## 2011-09-15 DIAGNOSIS — IMO0001 Reserved for inherently not codable concepts without codable children: Secondary | ICD-10-CM | POA: Insufficient documentation

## 2011-09-15 DIAGNOSIS — M6281 Muscle weakness (generalized): Secondary | ICD-10-CM | POA: Insufficient documentation

## 2011-09-15 DIAGNOSIS — R269 Unspecified abnormalities of gait and mobility: Secondary | ICD-10-CM | POA: Insufficient documentation

## 2011-09-17 ENCOUNTER — Ambulatory Visit: Payer: Medicare Other | Admitting: Physical Therapy

## 2011-09-22 ENCOUNTER — Ambulatory Visit: Payer: Medicare Other | Admitting: Physical Therapy

## 2011-09-22 ENCOUNTER — Telehealth: Payer: Self-pay | Admitting: *Deleted

## 2011-09-22 NOTE — Telephone Encounter (Signed)
Message copied by Oda Kilts on Tue Sep 22, 2011  4:27 PM ------      Message from: Barkley Boards      Created: Wed Aug 26, 2011  1:00 PM      Regarding: RE: Recall       I think it was a colon recall.      Sorry, I got click happy with the mouse.            ----- Message -----         From: Genella Mech, CMA         Sent: 08/26/2011  12:57 PM           To: Lurline Del, RN      Subject: RE: Recall                                               What recall are you referring to? Colon recall or office visit      ----- Message -----         From: Inda Castle, MD         Sent: 08/25/2011   4:34 PM           To: Genella Mech, CMA      Subject: FW: Recall                                                           ----- Message -----         From: Lurline Del, RN         Sent: 08/24/2011   6:05 PM           To: Inda Castle, MD      Subject: Recall                                                   Dr Deatra Ina,            I am very sorry but I accidentally deleted this pt's reminder in the system for your next appointment.  Can you please forward this to your nurse to have them reenter the reminder.            Sorry      Ander Purpura RN

## 2011-09-22 NOTE — Telephone Encounter (Signed)
Call recall is May 2014. [name] [name] re-entered recall that was accidentally deleated

## 2011-09-24 ENCOUNTER — Ambulatory Visit: Payer: Medicare Other | Admitting: Physical Therapy

## 2011-09-27 ENCOUNTER — Encounter: Payer: Self-pay | Admitting: Internal Medicine

## 2011-09-30 ENCOUNTER — Other Ambulatory Visit: Payer: Self-pay | Admitting: Internal Medicine

## 2011-09-30 NOTE — Telephone Encounter (Signed)
Done

## 2011-10-06 ENCOUNTER — Ambulatory Visit: Payer: Medicare Other | Admitting: Physical Therapy

## 2011-10-12 ENCOUNTER — Encounter: Payer: Medicare Other | Admitting: Physical Therapy

## 2011-10-13 ENCOUNTER — Ambulatory Visit: Payer: Medicare Other | Attending: Neurology | Admitting: Physical Therapy

## 2011-10-13 DIAGNOSIS — M6281 Muscle weakness (generalized): Secondary | ICD-10-CM | POA: Insufficient documentation

## 2011-10-13 DIAGNOSIS — R269 Unspecified abnormalities of gait and mobility: Secondary | ICD-10-CM | POA: Insufficient documentation

## 2011-10-13 DIAGNOSIS — IMO0001 Reserved for inherently not codable concepts without codable children: Secondary | ICD-10-CM | POA: Insufficient documentation

## 2011-10-19 ENCOUNTER — Ambulatory Visit: Payer: Medicare Other | Admitting: Physical Therapy

## 2011-10-26 ENCOUNTER — Ambulatory Visit: Payer: Medicare Other | Admitting: Physical Therapy

## 2011-11-16 ENCOUNTER — Ambulatory Visit: Payer: Medicare Other | Admitting: Physical Therapy

## 2011-11-17 ENCOUNTER — Encounter: Payer: Medicare Other | Admitting: Physical Therapy

## 2011-11-19 ENCOUNTER — Ambulatory Visit: Payer: Medicare Other | Attending: Neurology | Admitting: Physical Therapy

## 2011-11-19 DIAGNOSIS — IMO0001 Reserved for inherently not codable concepts without codable children: Secondary | ICD-10-CM | POA: Insufficient documentation

## 2011-11-19 DIAGNOSIS — R269 Unspecified abnormalities of gait and mobility: Secondary | ICD-10-CM | POA: Insufficient documentation

## 2011-11-19 DIAGNOSIS — M6281 Muscle weakness (generalized): Secondary | ICD-10-CM | POA: Insufficient documentation

## 2011-11-23 ENCOUNTER — Ambulatory Visit: Payer: Medicare Other | Admitting: Physical Therapy

## 2011-11-30 ENCOUNTER — Ambulatory Visit: Payer: Medicare Other | Admitting: Physical Therapy

## 2011-12-02 ENCOUNTER — Ambulatory Visit: Payer: Medicare Other | Admitting: Physical Therapy

## 2011-12-02 ENCOUNTER — Other Ambulatory Visit: Payer: Self-pay | Admitting: Cardiology

## 2011-12-04 ENCOUNTER — Other Ambulatory Visit: Payer: Self-pay | Admitting: Neurology

## 2011-12-04 DIAGNOSIS — G959 Disease of spinal cord, unspecified: Secondary | ICD-10-CM

## 2011-12-04 DIAGNOSIS — G930 Cerebral cysts: Secondary | ICD-10-CM

## 2011-12-06 ENCOUNTER — Other Ambulatory Visit: Payer: Self-pay | Admitting: Internal Medicine

## 2011-12-07 ENCOUNTER — Ambulatory Visit: Payer: Medicare Other | Admitting: Physical Therapy

## 2011-12-10 ENCOUNTER — Other Ambulatory Visit: Payer: Medicare Other

## 2011-12-11 ENCOUNTER — Ambulatory Visit
Admission: RE | Admit: 2011-12-11 | Discharge: 2011-12-11 | Disposition: A | Discharge status: Home / Self Care | Payer: Medicare Other | Source: Ambulatory Visit | Attending: Neurology | Admitting: Neurology

## 2011-12-11 DIAGNOSIS — G959 Disease of spinal cord, unspecified: Secondary | ICD-10-CM

## 2011-12-11 DIAGNOSIS — G930 Cerebral cysts: Secondary | ICD-10-CM

## 2011-12-11 MED ORDER — GADOBENATE DIMEGLUMINE 529 MG/ML IV SOLN
13.0000 mL | Freq: Once | INTRAVENOUS | Status: AC | PRN
  Administered 2011-12-11: 13 mL via INTRAVENOUS

## 2011-12-14 ENCOUNTER — Ambulatory Visit: Payer: Medicare Other | Attending: Neurology | Admitting: Physical Therapy

## 2011-12-14 DIAGNOSIS — M6281 Muscle weakness (generalized): Secondary | ICD-10-CM | POA: Insufficient documentation

## 2011-12-14 DIAGNOSIS — R269 Unspecified abnormalities of gait and mobility: Secondary | ICD-10-CM | POA: Insufficient documentation

## 2011-12-14 DIAGNOSIS — IMO0001 Reserved for inherently not codable concepts without codable children: Secondary | ICD-10-CM | POA: Insufficient documentation

## 2011-12-22 ENCOUNTER — Ambulatory Visit: Payer: Medicare Other | Admitting: Physical Therapy

## 2011-12-29 ENCOUNTER — Ambulatory Visit: Payer: Medicare Other | Admitting: Physical Therapy

## 2012-01-05 ENCOUNTER — Encounter: Payer: Medicare Other | Admitting: Physical Therapy

## 2012-01-11 ENCOUNTER — Ambulatory Visit: Payer: Medicare Other | Attending: Neurology | Admitting: Physical Therapy

## 2012-01-11 DIAGNOSIS — IMO0001 Reserved for inherently not codable concepts without codable children: Secondary | ICD-10-CM | POA: Insufficient documentation

## 2012-01-11 DIAGNOSIS — M6281 Muscle weakness (generalized): Secondary | ICD-10-CM | POA: Insufficient documentation

## 2012-01-11 DIAGNOSIS — R269 Unspecified abnormalities of gait and mobility: Secondary | ICD-10-CM | POA: Insufficient documentation

## 2012-01-12 ENCOUNTER — Encounter: Payer: Medicare Other | Admitting: Physical Therapy

## 2012-01-13 ENCOUNTER — Other Ambulatory Visit: Payer: Self-pay | Admitting: *Deleted

## 2012-01-13 NOTE — Telephone Encounter (Signed)
Request for Clonazepam 0.5 mg tablet [last refill given 02.24.2012 #60x5--marked Discontinued on 08.09.2012--Last OV 08.01.2011] Please advise.

## 2012-01-14 MED ORDER — CLONAZEPAM 0.5 MG PO TABS: ORAL_TABLET | ORAL | Status: DC

## 2012-01-14 NOTE — Telephone Encounter (Signed)
Last seen 08/24/11 for annual exam!  OK for refill

## 2012-01-14 NOTE — Telephone Encounter (Signed)
Rx Done.

## 2012-02-22 ENCOUNTER — Ambulatory Visit: Payer: Medicare Other | Attending: Neurology | Admitting: Physical Therapy

## 2012-02-22 DIAGNOSIS — M6281 Muscle weakness (generalized): Secondary | ICD-10-CM | POA: Insufficient documentation

## 2012-02-22 DIAGNOSIS — R269 Unspecified abnormalities of gait and mobility: Secondary | ICD-10-CM | POA: Insufficient documentation

## 2012-02-22 DIAGNOSIS — IMO0001 Reserved for inherently not codable concepts without codable children: Secondary | ICD-10-CM | POA: Insufficient documentation

## 2012-02-23 ENCOUNTER — Ambulatory Visit: Payer: Medicare Other | Admitting: Cardiology

## 2012-02-26 ENCOUNTER — Encounter: Payer: Self-pay | Admitting: Cardiology

## 2012-02-26 ENCOUNTER — Ambulatory Visit (INDEPENDENT_AMBULATORY_CARE_PROVIDER_SITE_OTHER): Payer: Medicare Other | Admitting: Cardiology

## 2012-02-26 VITALS — BP 130/76 | HR 59 | Ht 62.0 in | Wt 143.8 lb

## 2012-02-26 DIAGNOSIS — E7800 Pure hypercholesterolemia, unspecified: Secondary | ICD-10-CM

## 2012-02-26 DIAGNOSIS — I2510 Atherosclerotic heart disease of native coronary artery without angina pectoris: Secondary | ICD-10-CM

## 2012-02-26 NOTE — Patient Instructions (Signed)
Your physician wants you to follow-up in: 6 MONTHS.  You will receive a reminder letter in the mail two months in advance. If you don't receive a letter, please call our office to schedule the follow-up appointment.  Your physician recommends that you continue on your current medications as directed. Please refer to the Current Medication list given to you today.

## 2012-02-28 NOTE — Progress Notes (Signed)
HPI:  The patient is in for follow up.  She has been diagnosed with lower extremity parkinson's.  Stress seems to make it worse, and notably she has a lot between her 72 year old mother and her daughter who is in a nursing home.  No current chest pain, and she and her husband are satisfied with her course of treatment.  Her main cardiac meds are her beta blocker and rosuvastatin.    Current Outpatient Prescriptions  Medication Sig Dispense Refill  . albuterol (PROAIR HFA) 108 (90 BASE) MCG/ACT inhaler Inhale 2 puffs into the lungs every 6 (six) hours as needed.        . beclomethasone (QVAR) 80 MCG/ACT inhaler Inhale 1 puff into the lungs as needed.        . Calcium Carbonate-Vitamin D (CALCIUM-VITAMIN D) 500-200 MG-UNIT per tablet Take 1 tablet by mouth daily.        . carbidopa-levodopa (SINEMET IR) 25-100 MG per tablet Take 1 tablet by mouth 3 (three) times daily.       . clonazePAM (KLONOPIN) 0.5 MG tablet Take 1 to 2 tablets a day.  60 tablet  5  . cyanocobalamin (,VITAMIN B-12,) 1000 MCG/ML injection Inject 1,000 mcg into the muscle every 30 (thirty) days.        . famotidine (PEPCID) 20 MG tablet Take 1 tablet (20 mg total) by mouth 2 (two) times daily.  60 tablet  6  . isosorbide mononitrate (IMDUR) 30 MG 24 hr tablet TAKE 1/2 TABLET BY MOUTH DAILY  30 tablet  6  . mometasone (NASONEX) 50 MCG/ACT nasal spray 2 sprays by Nasal route as needed.       . nitroGLYCERIN (NITROSTAT) 0.4 MG SL tablet Place 0.4 mg under the tongue every 5 (five) minutes as needed.        . propranolol (INDERAL) 10 MG tablet TAKE 1 TABLET BY MOUTH THREE TIMES A DAY  180 tablet  1  . rosuvastatin (CRESTOR) 20 MG tablet Take 1 tablet (20 mg total) by mouth daily.  30 tablet  8  . sertraline (ZOLOFT) 25 MG tablet Take 1 tablet (25 mg total) by mouth daily.  30 tablet  11  . SYNTHROID 100 MCG tablet TAKE 1 TABLET BY MOUTH DAILY.  30 tablet  6  . trimethoprim (TRIMPEX) 100 MG tablet Take 100 mg by mouth daily.            No Known Allergies  Past Medical History  Diagnosis Date  . GERD (gastroesophageal reflux disease)   . Familial tremor     followed by Dr. Erling Cruz  . Unspecified chronic bronchitis   . Vaginitis   . Irritable bladder   . Pneumonia   . Hypothyroidism   . Depression   . Asthma   . CAD (coronary artery disease)     myoview 5/08:  EF 76%, no scar, no ischemia, +ECG changes with exercise;  cath 04/08/07:  pLAD 30%, mLAD 60-70% - med tx.  Marland Kitchen HLD (hyperlipidemia)     Past Surgical History  Procedure Date  . Laparoscopic nissen fundoplication   . Cystectomy   . Tumor removal   . Tubal ligation     Family History  Problem Relation Age of Onset  . Coronary artery disease    . Heart attack    . Hypertension    . Diabetes    . Breast cancer    . Hypertension Mother   . Heart disease Father  History   Social History  . Marital Status: Married    Spouse Name: N/A    Number of Children: N/A  . Years of Education: N/A   Occupational History  . Not on file.   Social History Main Topics  . Smoking status: Never Smoker   . Smokeless tobacco: Never Used  . Alcohol Use: No  . Drug Use: No  . Sexually Active: Not Currently   Other Topics Concern  . Not on file   Social History Narrative   HSG, retired from [employer] . married '60. 1 son - '65; 1 daughter - '61; 2 grandchildren; 1 great-grandchild. Homemaker. marriage in good health. 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 Positive history of passive tobacco smoke exposure.    ROS: Please see the HPI.  All other systems reviewed and negative.  PHYSICAL EXAM:  BP 130/76  Pulse 59  Ht 5' 2"  (1.575 m)  Wt 143 lb 12.8 oz (65.227 kg)  BMI 26.30 kg/m2  General: Well developed, well nourished, in no acute distress. Head:  Normocephalic and atraumatic.  Moderate head movement.   Neck: no JVD Lungs: Clear to auscultation and percussion. Heart: Normal S1 and S2.  No murmur, rubs or gallops.  Pulses: Pulses normal in all 4  extremities. Extremities: No clubbing or cyanosis. No edema. Neurologic: Alert and oriented x 3.  EKG:  SB.  Otherwise normal  ASSESSMENT AND PLAN:

## 2012-03-01 ENCOUNTER — Ambulatory Visit: Payer: Medicare Other | Admitting: Physical Therapy

## 2012-03-03 ENCOUNTER — Ambulatory Visit: Payer: Medicare Other | Admitting: Physical Therapy

## 2012-03-07 ENCOUNTER — Ambulatory Visit: Payer: Medicare Other | Admitting: Physical Therapy

## 2012-03-10 ENCOUNTER — Encounter: Payer: Medicare Other | Admitting: Physical Therapy

## 2012-03-10 ENCOUNTER — Ambulatory Visit: Payer: Medicare Other | Attending: Neurology | Admitting: Physical Therapy

## 2012-03-10 DIAGNOSIS — M6281 Muscle weakness (generalized): Secondary | ICD-10-CM | POA: Insufficient documentation

## 2012-03-10 DIAGNOSIS — IMO0001 Reserved for inherently not codable concepts without codable children: Secondary | ICD-10-CM | POA: Insufficient documentation

## 2012-03-10 DIAGNOSIS — R269 Unspecified abnormalities of gait and mobility: Secondary | ICD-10-CM | POA: Insufficient documentation

## 2012-03-14 ENCOUNTER — Other Ambulatory Visit: Payer: Self-pay | Admitting: *Deleted

## 2012-03-14 ENCOUNTER — Ambulatory Visit: Payer: Medicare Other | Admitting: Physical Therapy

## 2012-03-14 MED ORDER — ROSUVASTATIN CALCIUM 20 MG PO TABS: 20.0000 mg | ORAL_TABLET | Freq: Every day | ORAL | Status: DC

## 2012-03-17 ENCOUNTER — Ambulatory Visit: Payer: Medicare Other | Admitting: Physical Therapy

## 2012-03-21 ENCOUNTER — Encounter: Payer: Medicare Other | Admitting: Physical Therapy

## 2012-03-24 ENCOUNTER — Ambulatory Visit: Payer: Medicare Other | Admitting: Physical Therapy

## 2012-03-24 NOTE — Assessment & Plan Note (Signed)
Probably is in need of lipid and liver.

## 2012-03-24 NOTE — Assessment & Plan Note (Signed)
Has mid LAD disease treated medically, and not causing issues.  Continue to monitor at present.  No substantial angina.

## 2012-03-29 ENCOUNTER — Telehealth: Payer: Self-pay

## 2012-03-29 ENCOUNTER — Encounter: Payer: Medicare Other | Admitting: Physical Therapy

## 2012-03-29 NOTE — Telephone Encounter (Signed)
[name] [name] Can you have her come for a lipid and liver. Thanks. TS  I spoke with the pt and she normally has her cholesterol checked by Dr Daylene Posey on a yearly basis for a routine physical. The pt is due to see Dr Daylene Posey this Summer for a physical and she will have her labs checked at that time. The pt will contact her PCP to schedule appointments.

## 2012-04-05 ENCOUNTER — Ambulatory Visit: Payer: Medicare Other | Admitting: Physical Therapy

## 2012-04-07 ENCOUNTER — Ambulatory Visit: Payer: Medicare Other | Admitting: Physical Therapy

## 2012-04-11 ENCOUNTER — Ambulatory Visit: Payer: Medicare Other | Attending: Neurology | Admitting: Physical Therapy

## 2012-04-11 DIAGNOSIS — R269 Unspecified abnormalities of gait and mobility: Secondary | ICD-10-CM | POA: Insufficient documentation

## 2012-04-11 DIAGNOSIS — M6281 Muscle weakness (generalized): Secondary | ICD-10-CM | POA: Insufficient documentation

## 2012-04-11 DIAGNOSIS — IMO0001 Reserved for inherently not codable concepts without codable children: Secondary | ICD-10-CM | POA: Insufficient documentation

## 2012-04-12 ENCOUNTER — Telehealth: Payer: Self-pay | Admitting: Cardiology

## 2012-04-12 DIAGNOSIS — E7800 Pure hypercholesterolemia, unspecified: Secondary | ICD-10-CM

## 2012-04-12 NOTE — Telephone Encounter (Signed)
Pt needs a lipid and liver checked. The pt will come into the office on 04/20/12 for lab work.

## 2012-04-12 NOTE — Telephone Encounter (Signed)
New problem   Patient calling need to have lab work set up . cholesterol

## 2012-04-14 ENCOUNTER — Ambulatory Visit: Payer: Medicare Other | Admitting: Physical Therapy

## 2012-04-18 ENCOUNTER — Ambulatory Visit: Payer: Medicare Other | Admitting: Physical Therapy

## 2012-04-19 ENCOUNTER — Encounter: Payer: Medicare Other | Admitting: Physical Therapy

## 2012-04-20 ENCOUNTER — Other Ambulatory Visit (INDEPENDENT_AMBULATORY_CARE_PROVIDER_SITE_OTHER): Payer: Medicare Other

## 2012-04-20 DIAGNOSIS — E7800 Pure hypercholesterolemia, unspecified: Secondary | ICD-10-CM

## 2012-04-20 LAB — HEPATIC FUNCTION PANEL
AST: 17 U/L (ref 0–37)
Alkaline Phosphatase: 80 U/L (ref 39–117)
Total Bilirubin: 0.3 mg/dL (ref 0.3–1.2)

## 2012-04-20 LAB — LIPID PANEL
LDL Cholesterol: 37 mg/dL (ref 0–99)
VLDL: 14.4 mg/dL (ref 0.0–40.0)

## 2012-04-21 ENCOUNTER — Ambulatory Visit: Payer: Medicare Other | Admitting: Physical Therapy

## 2012-04-21 ENCOUNTER — Encounter: Payer: Medicare Other | Admitting: Physical Therapy

## 2012-04-25 ENCOUNTER — Ambulatory Visit: Payer: Medicare Other | Admitting: Physical Therapy

## 2012-04-28 ENCOUNTER — Ambulatory Visit: Payer: Medicare Other | Admitting: Physical Therapy

## 2012-04-29 ENCOUNTER — Other Ambulatory Visit: Payer: Self-pay | Admitting: Neurology

## 2012-04-29 DIAGNOSIS — R269 Unspecified abnormalities of gait and mobility: Secondary | ICD-10-CM

## 2012-05-04 ENCOUNTER — Ambulatory Visit
Admission: RE | Admit: 2012-05-04 | Discharge: 2012-05-04 | Disposition: A | Discharge status: Home / Self Care | Payer: Medicare Other | Source: Ambulatory Visit | Attending: Neurology | Admitting: Neurology

## 2012-05-04 DIAGNOSIS — R269 Unspecified abnormalities of gait and mobility: Secondary | ICD-10-CM

## 2012-05-04 MED ORDER — GADOBENATE DIMEGLUMINE 529 MG/ML IV SOLN
13.0000 mL | Freq: Once | INTRAVENOUS | Status: AC | PRN
  Administered 2012-05-04: 13 mL via INTRAVENOUS

## 2012-05-05 ENCOUNTER — Ambulatory Visit: Payer: Medicare Other | Admitting: Physical Therapy

## 2012-05-10 ENCOUNTER — Ambulatory Visit: Payer: Medicare Other | Attending: Neurology | Admitting: Physical Therapy

## 2012-05-10 DIAGNOSIS — M6281 Muscle weakness (generalized): Secondary | ICD-10-CM | POA: Insufficient documentation

## 2012-05-10 DIAGNOSIS — IMO0001 Reserved for inherently not codable concepts without codable children: Secondary | ICD-10-CM | POA: Insufficient documentation

## 2012-05-10 DIAGNOSIS — R269 Unspecified abnormalities of gait and mobility: Secondary | ICD-10-CM | POA: Insufficient documentation

## 2012-05-16 ENCOUNTER — Ambulatory Visit: Payer: Medicare Other | Admitting: Physical Therapy

## 2012-05-27 ENCOUNTER — Other Ambulatory Visit: Payer: Self-pay

## 2012-05-27 DIAGNOSIS — E7800 Pure hypercholesterolemia, unspecified: Secondary | ICD-10-CM

## 2012-05-30 ENCOUNTER — Encounter: Payer: Medicare Other | Admitting: Physical Therapy

## 2012-06-06 ENCOUNTER — Ambulatory Visit: Payer: Medicare Other | Admitting: Physical Therapy

## 2012-06-07 ENCOUNTER — Other Ambulatory Visit: Payer: Self-pay | Admitting: Internal Medicine

## 2012-06-16 ENCOUNTER — Ambulatory Visit: Payer: Medicare Other | Attending: Neurology | Admitting: Physical Therapy

## 2012-06-16 DIAGNOSIS — M6281 Muscle weakness (generalized): Secondary | ICD-10-CM | POA: Insufficient documentation

## 2012-06-16 DIAGNOSIS — IMO0001 Reserved for inherently not codable concepts without codable children: Secondary | ICD-10-CM | POA: Insufficient documentation

## 2012-06-16 DIAGNOSIS — R269 Unspecified abnormalities of gait and mobility: Secondary | ICD-10-CM | POA: Insufficient documentation

## 2012-07-15 ENCOUNTER — Other Ambulatory Visit: Payer: Self-pay | Admitting: *Deleted

## 2012-07-15 MED ORDER — CLONAZEPAM 0.5 MG PO TABS: ORAL_TABLET | ORAL | Status: DC

## 2012-07-18 ENCOUNTER — Encounter: Payer: Self-pay | Admitting: Internal Medicine

## 2012-07-18 ENCOUNTER — Ambulatory Visit (INDEPENDENT_AMBULATORY_CARE_PROVIDER_SITE_OTHER): Payer: Medicare Other | Admitting: Internal Medicine

## 2012-07-18 ENCOUNTER — Other Ambulatory Visit: Payer: Self-pay | Admitting: Internal Medicine

## 2012-07-18 ENCOUNTER — Telehealth: Payer: Self-pay | Admitting: Internal Medicine

## 2012-07-18 VITALS — BP 142/80 | HR 65 | Temp 97.4°F | Resp 16 | Wt 144.0 lb

## 2012-07-18 DIAGNOSIS — H81399 Other peripheral vertigo, unspecified ear: Secondary | ICD-10-CM

## 2012-07-18 DIAGNOSIS — G20 Parkinson's disease: Secondary | ICD-10-CM

## 2012-07-18 DIAGNOSIS — M25519 Pain in unspecified shoulder: Secondary | ICD-10-CM

## 2012-07-18 DIAGNOSIS — H8109 Meniere's disease, unspecified ear: Secondary | ICD-10-CM

## 2012-07-18 DIAGNOSIS — M25512 Pain in left shoulder: Secondary | ICD-10-CM

## 2012-07-18 MED ORDER — HYDROCODONE-ACETAMINOPHEN 5-325 MG PO TABS: 1.0000 | ORAL_TABLET | Freq: Four times a day (QID) | ORAL | Status: AC | PRN

## 2012-07-18 MED ORDER — MECLIZINE HCL 25 MG PO TABS: 25.0000 mg | ORAL_TABLET | Freq: Four times a day (QID) | ORAL | Status: AC | PRN

## 2012-07-18 MED ORDER — MELOXICAM 15 MG PO TABS: 15.0000 mg | ORAL_TABLET | Freq: Every day | ORAL | Status: DC

## 2012-07-18 NOTE — Telephone Encounter (Signed)
Pt called back about apt, she was given both options and insisted that she be seen today, scheduled for late this afternoon and made aware of the wait

## 2012-07-18 NOTE — Patient Instructions (Addendum)
Shoulder pain that is progressive with h/o cervical disk  Joint disease that was predominantly on the left, with normal strength and reflexes suggests a primary shoulder problem such as a rotator cuff tear.  Plan Medication: meloxicam once a day - an anti inflammatory drug; hydrocodone/tylenol as needed (bedtime).  Referral to Dr. Veronia Beets at [name] - upper extremity specialist.    Balance problems - history and exam favor labyrinthitis as the cause although a brainstem or cerebellar stroke cannot be ruled out.  Plan - meclizine 25 mg 1/2 or 1 tablet every 6 hours for dizziness.   If there is no improvement will need to get an MRI brain.   Labyrinthitis (Inner Ear Inflammation) Your exam shows you have an inner ear disturbance or labyrinthitis. The cause of this condition is not known. But it may be due to a virus infection. The symptoms of labyrinthitis include vertigo or dizziness made worse by motion, nausea and vomiting. The onset of labyrinthitis may be very sudden. It usually lasts for a few days and then clears up over 1-2 weeks. The treatment of an inner ear disturbance includes bed rest and medications to reduce dizziness, nausea, and vomiting. You should stay away from alcohol, tranquilizers, caffeine, nicotine, or any medicine your doctor thinks may make your symptoms worse. Further testing may be needed to evaluate your hearing and balance system. Please see your doctor or go to the emergency room right away if you have:  Increasing vertigo, earache, loss of hearing, or ear drainage.   Headache, blurred vision, trouble walking, fainting, or fever.   Persistent vomiting, dehydration, or extreme weakness.  Document Released: 10/26/2005 Document Revised: 10/15/2011 Document Reviewed: 04/13/2007 [facility] Patient Information 2012 [place].

## 2012-07-18 NOTE — Telephone Encounter (Signed)
Today will be tough - already double booked. Can try for late today but she will have a wait or tomorrow

## 2012-07-18 NOTE — Progress Notes (Signed)
Subjective:    Patient ID: [name] [name], female    DOB: 1940/12/24, 71 y.o.   MRN: [id number]  HPI In the interval since she was last seen in Primary care she has been followed by Dr. Erling Cruz for tremor vs parkinsonism, she has seen Dr. 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 Dr. Waymond Cera, neurologist. 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 She had normal blood work, abnormal sweat test- hypohidrosis throughout including forehead and upper extremities. 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 She is currently on no medication for parkinsonism. She has been participating in physical therapy for the last year.   AT eval with Dr. Erling Cruz she had UE and LE weakness leading to the above. She has had several falls. OVer the past several days she has had increased pain in the right arm, described as an aching pain but if she raises her arm it is a sharp pain. She has also had progressive weakness in the right hand and arm.   She is also very dizzy over the past 24-36 hours. Today she has had more severe dizziness to the point where she cannot ambulate unassisted. . She also had BP 180/60, which has progressively come down during the day.   Past Medical History  Diagnosis Date  . GERD (gastroesophageal reflux disease)   . Familial tremor     followed by Dr. Erling Cruz  . Unspecified chronic bronchitis   . Vaginitis   . Irritable bladder   . Pneumonia   . Hypothyroidism   . Depression   . Asthma   . CAD (coronary artery disease)     myoview 5/08:  EF 76%, no scar, no ischemia, +ECG changes with exercise;  cath 04/08/07:  pLAD 30%, mLAD 60-70% - med tx.  Marland Kitchen HLD (hyperlipidemia)    Past Surgical History  Procedure Date  . Laparoscopic nissen fundoplication   . Cystectomy   . Tumor removal   . Tubal  ligation    Family History  Problem Relation Age of Onset  . Coronary artery disease    . Heart attack    . Hypertension    . Diabetes    . Breast cancer    . Hypertension Mother   . Heart disease Father    History   Social History  . Marital Status: Married    Spouse Name: N/A    Number of Children: N/A  . Years of Education: N/A   Occupational History  . Not on file.   Social History Main Topics  . Smoking status: Never Smoker   . Smokeless tobacco: Never Used  . Alcohol Use: No  . Drug Use: No  . Sexually Active: Not Currently   Other Topics Concern  . Not on file   Social History Narrative   HSG, retired from [employer] . married '60. 1 son - '65; 1 daughter - '61; 2 grandchildren; 1 great-grandchild. Homemaker. marriage in good health. 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 Positive history of passive tobacco smoke exposure.    Current Outpatient Prescriptions on File Prior to Visit  Medication Sig Dispense Refill  . albuterol (PROAIR HFA) 108 (90 BASE) MCG/ACT inhaler Inhale 2 puffs into the lungs every 6 (six) hours as needed.        . beclomethasone (QVAR) 80 MCG/ACT inhaler Inhale 1  puff into the lungs as needed.        . Calcium Carbonate-Vitamin D (CALCIUM-VITAMIN D) 500-200 MG-UNIT per tablet Take 1 tablet by mouth daily.        . clonazePAM (KLONOPIN) 0.5 MG tablet Take 1 to 2 tablets a day.  60 tablet  5  . cyanocobalamin (,VITAMIN B-12,) 1000 MCG/ML injection Inject 1,000 mcg into the muscle every 30 (thirty) days.        . isosorbide mononitrate (IMDUR) 30 MG 24 hr tablet TAKE 1/2 TABLET BY MOUTH DAILY  30 tablet  6  . mometasone (NASONEX) 50 MCG/ACT nasal spray 2 sprays by Nasal route as needed.       . nitroGLYCERIN (NITROSTAT) 0.4 MG SL tablet Place 0.4 mg under the tongue every 5 (five) minutes as needed.        . rosuvastatin (CRESTOR) 20 MG tablet Take 0.5 tablets (10 mg total) by mouth daily.  1 tablet  0  . sertraline (ZOLOFT) 25 MG tablet TAKE 1 TABLET  BY MOUTH EVERY MORNING  30 tablet  11  . SYNTHROID 100 MCG tablet TAKE 1 TABLET BY MOUTH DAILY.  30 tablet  6  . trimethoprim (TRIMPEX) 100 MG tablet Take 100 mg by mouth daily.        . carbidopa-levodopa (SINEMET IR) 25-100 MG per tablet Take 1 tablet by mouth 3 (three) times daily.       . famotidine (PEPCID) 20 MG tablet Take 1 tablet (20 mg total) by mouth 2 (two) times daily.  60 tablet  6  . propranolol (INDERAL) 10 MG tablet TAKE 1 TABLET BY MOUTH THREE TIMES A DAY  180 tablet  1  . DISCONTD: sertraline (ZOLOFT) 25 MG tablet Take 1 tablet (25 mg total) by mouth daily.  30 tablet  11  . DISCONTD: SYNTHROID 100 MCG tablet TAKE 1 TABLET BY MOUTH DAILY.  30 tablet  6      Review of Systems System review is negative for any constitutional, cardiac, pulmonary, GI or neuro symptoms or complaints other than as described in the HPI.     Objective:   Physical Exam Filed Vitals:   07/18/12 1740  BP: 142/80  Pulse: 65  Temp: 97.4 F (36.3 C)  Resp: 16   Wt Readings from Last 3 Encounters:  07/18/12 144 lb (65.318 kg)  02/26/12 143 lb 12.8 oz (65.227 kg)  08/24/11 144 lb 12.8 oz (65.681 kg)   Gen'l - older woman in no acute distress HEENT- C&SD clear Cor- RRR Pulm - normal MSK - decreased ROM right shoulder with adduction and abduction and rotation and flexion. Normal grip, biceps strength. Normal DTRs Neuro - non-focal exam       Assessment & Plan:  1. Acute labyrinthitis - history and exam c/w acute labyrinthitis in a setting of neurologic symptoms of weakness in the LE.  Plan Meclizine 12.5-25 mg q 6  For lack of response or worsening symptoms - call back and MRI brain to r/o CVA cerebellum/brainstem

## 2012-07-18 NOTE — Telephone Encounter (Signed)
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 Seen in [facility] on 06/21/12-06/24/12 and sx worse after she had testing done:Thermoregulatory Sweat Test and Autonomic Reflex Testing where arm was manipulated.   Hx falls~ 3x in past few months where she has hit her arm. She is also using R arm to pull herself up from commode. Arm is aching above wrist, above elbow, around shoulder onto upper back. She can move her arm but if she grabs something or lifts up arm it hurts. Pain is keeping her awake at night.  She is on low dose of Crestor 69m and has been taken off of it in the past to r/o -statin caused muscle spasms. Taking Tylenol 2 tabs PO at HS and not helping. She started feeling lightheaded yesterday- hx vertigo. Is needing help today to get up. BP=158/76. Triage and Care advice per [facility]lce Pain and Dizziness Protocol and appnt advised within 4 hours for "having sensation of turning or spinning that affects balance AND not responsive to 4 hours of home care". Please call her to let her know if she can see Dr. NLinda Hedgestoday about arm pain and dizziness. ; Best Callback Phone Number: [phone]

## 2012-07-20 DIAGNOSIS — M25512 Pain in left shoulder: Secondary | ICD-10-CM | POA: Insufficient documentation

## 2012-07-20 DIAGNOSIS — G20 Parkinson's disease: Secondary | ICD-10-CM | POA: Insufficient documentation

## 2012-07-20 NOTE — Assessment & Plan Note (Signed)
New on-set shoulder pain with decreased range of motion. This really started after a fall where she stopper herself with arms extended. History and exam suggest rotator cuff injury (Sept '13)  Plan Refer to Dr. Veronia Beets  Meloxicam for pain daily and Hydrocodone/APAP prn - probably at night so she can sleep

## 2012-07-20 NOTE — Assessment & Plan Note (Signed)
seen Dr. 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 Dr. Waymond Cera, neurologist. 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 She had normal blood work, abnormal sweat test- hypohidrosis throughout including forehead and upper extremities. 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 She is currently on no medication for parkinsonism. She has been participating in physical therapy for the last year.

## 2012-07-21 ENCOUNTER — Telehealth: Payer: Self-pay | Admitting: Cardiology

## 2012-07-21 NOTE — Telephone Encounter (Signed)
Left message to call back  

## 2012-07-21 NOTE — Telephone Encounter (Signed)
Pt has swelling in her LE and some dizziness and b/p was elevated

## 2012-07-25 ENCOUNTER — Other Ambulatory Visit: Payer: Medicare Other

## 2012-07-26 ENCOUNTER — Telehealth: Payer: Self-pay

## 2012-07-26 DIAGNOSIS — R42 Dizziness and giddiness: Secondary | ICD-10-CM

## 2012-07-26 NOTE — Telephone Encounter (Signed)
Pt called stating there has only been mild improvement in dizziness with medication and she was advised by MD to call back if not improved, please advise

## 2012-07-26 NOTE — Telephone Encounter (Signed)
I reviewed pt's chart and she is having further evaluation of symptoms by Dr Linda Hedges.

## 2012-07-26 NOTE — Telephone Encounter (Signed)
Per my last office note: if dizziness does not respond to medication will need MRI brain - [facility] notified.

## 2012-07-27 ENCOUNTER — Telehealth: Payer: Self-pay | Admitting: Cardiology

## 2012-07-27 NOTE — Telephone Encounter (Signed)
New problem:  C/o swelling in both feet are still & staying swollen all day. Upcoming MRI on 9/20.

## 2012-07-27 NOTE — Telephone Encounter (Signed)
I spoke with Dr Lia Foyer and he thinks that Mobic is causing fluid retention (this was started by Dr Linda Hedges last week).  I will touch base with the pt on 07/28/12.

## 2012-07-27 NOTE — Telephone Encounter (Signed)
Patient informed; understood & agreed/SLS

## 2012-07-28 NOTE — Telephone Encounter (Signed)
Left message to call back  

## 2012-07-28 NOTE — Telephone Encounter (Signed)
I spoke with the pt and she complains of having some swelling when she saw Dr Linda Hedges.  The pt was started on Mobic and her lower extremity swelling has worsened. I made the pt aware that this is most likely a side effect from the Mobic.  The pt was instructed to decrease her salt intake and elevate her legs when possible. Pt agreed with plan.

## 2012-07-29 ENCOUNTER — Ambulatory Visit (HOSPITAL_COMMUNITY)
Admission: RE | Admit: 2012-07-29 | Discharge: 2012-07-29 | Disposition: A | Discharge status: Home / Self Care | Payer: Medicare Other | Source: Ambulatory Visit | Attending: Internal Medicine | Admitting: Internal Medicine

## 2012-07-29 DIAGNOSIS — R259 Unspecified abnormal involuntary movements: Secondary | ICD-10-CM | POA: Insufficient documentation

## 2012-07-29 DIAGNOSIS — R42 Dizziness and giddiness: Secondary | ICD-10-CM

## 2012-08-01 ENCOUNTER — Telehealth: Payer: Self-pay | Admitting: *Deleted

## 2012-08-01 NOTE — Telephone Encounter (Signed)
Message copied by Ellene Route on Mon Aug 01, 2012  1:40 PM ------      Message from: Adella Hare E      Created: Mon Aug 01, 2012  5:47 AM       Call patient: MRI brain reveals no abnormality, specifically no cerebellar or brain stem lesion that would explain the dizziness. Follow-up with her neurologist. May schedule OV if she wishes to discuss further.

## 2012-08-01 NOTE — Telephone Encounter (Signed)
Patient notified of MRI results. Patient states will make yearly physical appt. Soon with Dr,Norins

## 2012-08-16 ENCOUNTER — Ambulatory Visit: Payer: Medicare Other | Admitting: Physical Therapy

## 2012-08-24 ENCOUNTER — Ambulatory Visit (INDEPENDENT_AMBULATORY_CARE_PROVIDER_SITE_OTHER): Payer: Medicare Other | Admitting: Internal Medicine

## 2012-08-24 ENCOUNTER — Encounter: Payer: Self-pay | Admitting: Internal Medicine

## 2012-08-24 VITALS — BP 122/68 | HR 68 | Temp 97.7°F | Resp 16 | Wt 147.0 lb

## 2012-08-24 DIAGNOSIS — Z23 Encounter for immunization: Secondary | ICD-10-CM

## 2012-08-24 DIAGNOSIS — E7800 Pure hypercholesterolemia, unspecified: Secondary | ICD-10-CM

## 2012-08-24 DIAGNOSIS — Z0000 Encounter for general adult medical examination without abnormal findings: Secondary | ICD-10-CM

## 2012-08-24 DIAGNOSIS — E039 Hypothyroidism, unspecified: Secondary | ICD-10-CM

## 2012-08-24 MED ORDER — FUROSEMIDE 20 MG PO TABS: 20.0000 mg | ORAL_TABLET | ORAL | Status: DC | PRN

## 2012-08-24 NOTE — Progress Notes (Signed)
Subjective:    Patient ID: [name] [name], female    DOB: Aug 15, 1941, 71 y.o.   MRN: [id number]  HPI The patient is here for annual Medicare wellness examination and management of other chronic and acute problems.  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 She is followed at the [facility] clinic in [place]. She is followed locally by Dr. Erling Cruz.  She has a frozen shoulder right - has not required surgery but is getting physical therapy   The risk factors are reflected in the social history.  The roster of all physicians providing medical care to patient - is listed in the Snapshot section of the chart.  Activities of daily living:  The patient is mostlyinedpendent in all ADLs: dressing, toileting, feeding as well as independent mobility but may need help getting on her shoes or help her with her balance.  Home safety : The patient has smoke detectors in the home. Falls - has had falls. She is getting grab bars for the bathroom, no rugs down.  They wear seatbelts.  firearms are present in the home, kept in a safe fashion. There is no violence in the home.   There is no risks for hepatitis, STDs or HIV. There is no   history of blood transfusion. They have no travel history to infectious disease endemic areas of the world.  The patient has not seen their dentist in the last six month. They have not seen their eye doctor in the last year. They admit to hearing difficulty - getting a little worse. and have not had audiologic testing in the last year.    They do not  have excessive sun exposure. Discussed the need for sun protection: hats, long sleeves and use of sunscreen if there is significant sun exposure.   Diet: the importance of a healthy diet is discussed. They do have a healthy diet.  The patient has no regular exercise program.  The benefits of regular aerobic exercise were discussed.  Depression screen: there are no signs or vegative symptoms of depression-  irritability, change in appetite, anhedonia, sadness/tearfullness.  Cognitive assessment: the patient manages all their financial and personal affairs and is actively engaged.   During the course of the visit the patient was educated and counseled about appropriate screening and preventive services including : fall prevention , diabetes screening, nutrition counseling, colorectal cancer screening, and recommended immunizations.  Past Medical History  Diagnosis Date  . GERD (gastroesophageal reflux disease)   . Familial tremor     followed by Dr. Erling Cruz  . Unspecified chronic bronchitis   . Vaginitis   . Irritable bladder   . Pneumonia   . Hypothyroidism   . Depression   . Asthma   . CAD (coronary artery disease)     myoview 5/08:  EF 76%, no scar, no ischemia, +ECG changes with exercise;  cath 04/08/07:  pLAD 30%, mLAD 60-70% - med tx.  Marland Kitchen HLD (hyperlipidemia)    Past Surgical History  Procedure Date  . Laparoscopic nissen fundoplication   . Cystectomy   . Tumor removal   . Tubal ligation    Family History  Problem Relation Age of Onset  . Coronary artery disease    . Heart attack    . Hypertension    . Diabetes    . Breast cancer    . Hypertension Mother   . Heart disease Father    History   Social History  . Marital Status:  Married    Spouse Name: N/A    Number of Children: N/A  . Years of Education: N/A   Occupational History  . Not on file.   Social History Main Topics  . Smoking status: Never Smoker   . Smokeless tobacco: Never Used  . Alcohol Use: No  . Drug Use: No  . Sexually Active: Not Currently   Other Topics Concern  . Not on file   Social History Narrative   HSG, retired from [employer] . married '60. 1 son - '65; 1 daughter - '61; 2 grandchildren; 1 great-grandchild. Homemaker. marriage in good health. 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 Positive history of passive tobacco smoke exposure.    Current Outpatient Prescriptions on File Prior to Visit    Medication Sig Dispense Refill  . albuterol (PROAIR HFA) 108 (90 BASE) MCG/ACT inhaler Inhale 2 puffs into the lungs every 6 (six) hours as needed.        . beclomethasone (QVAR) 80 MCG/ACT inhaler Inhale 1 puff into the lungs as needed.        . Calcium Carbonate-Vitamin D (CALCIUM-VITAMIN D) 500-200 MG-UNIT per tablet Take 1 tablet by mouth daily.        . clonazePAM (KLONOPIN) 0.5 MG tablet Take 1 to 2 tablets a day.  60 tablet  5  . cyanocobalamin (,VITAMIN B-12,) 1000 MCG/ML injection Inject 1,000 mcg into the muscle every 30 (thirty) days.        . famotidine (PEPCID) 20 MG tablet Take 1 tablet (20 mg total) by mouth 2 (two) times daily.  60 tablet  6  . isosorbide mononitrate (IMDUR) 30 MG 24 hr tablet TAKE 1/2 TABLET BY MOUTH DAILY  30 tablet  6  . meloxicam (MOBIC) 15 MG tablet Take 1 tablet (15 mg total) by mouth daily.  30 tablet  0  . mometasone (NASONEX) 50 MCG/ACT nasal spray 2 sprays by Nasal route as needed.       . nitroGLYCERIN (NITROSTAT) 0.4 MG SL tablet Place 0.4 mg under the tongue every 5 (five) minutes as needed.        . propranolol (INDERAL) 10 MG tablet TAKE 1 TABLET BY MOUTH THREE TIMES A DAY  180 tablet  1  . rosuvastatin (CRESTOR) 20 MG tablet Take 0.5 tablets (10 mg total) by mouth daily.  1 tablet  0  . sertraline (ZOLOFT) 25 MG tablet TAKE 1 TABLET BY MOUTH EVERY MORNING  30 tablet  11  . SYNTHROID 100 MCG tablet TAKE 1 TABLET BY MOUTH DAILY.  30 tablet  6  . trimethoprim (TRIMPEX) 100 MG tablet Take 100 mg by mouth daily.        . carbidopa-levodopa (SINEMET IR) 25-100 MG per tablet Take 1 tablet by mouth 3 (three) times daily.           Review of Systems System review is negative for any constitutional, cardiac, pulmonary, GI or neuro symptoms or complaints other than as described in the HPI.     Objective:   Physical Exam Filed Vitals:   08/24/12 1459  BP: 122/68  Pulse: 68  Temp: 97.7 F (36.5 C)  Resp: 16   Gen'l- mildly overweight white  woman in no acute distress HEENT - C&S clear, PERRLA,  Neck - supple, no thyromegaly Nodes - negative cervical region Cor- 2+ radial pulse, RRR, no murmurs, no carotid bruits Pulm - normal respirations, CTAP Abd- soft Genitalia/rectal - deferred Ext - DIP, PIP joint changes both hands - moderated,  preserved ROM; Adduction right shoulder 60% normal, extension 60% normal Neuro - A&O x 3, CN II - XII grossly normal. Moderated familial tremor, ambulates with a walker. More detailed exam deferred to Dr. Erling Cruz and [place] - clear  Lab Results  Component Value Date   WBC 8.3 05/27/2011   HGB 12.3 05/27/2011   HCT 36.5 05/27/2011   PLT 266.0 05/27/2011   GLUCOSE 124* 05/27/2011   CHOL 106 04/20/2012   TRIG 72.0 04/20/2012   HDL 54.30 04/20/2012   LDLDIRECT 161.5 06/09/2010   LDLCALC 37 04/20/2012   ALT 21 04/20/2012   AST 17 04/20/2012   NA 141 05/27/2011   K 4.0 05/27/2011   CL 106 05/27/2011   CREATININE 0.6 05/27/2011   BUN 18 05/27/2011   CO2 28 05/27/2011   TSH 0.55 05/27/2011   INR 0.9 RATIO 04/07/2007        Assessment & Plan:

## 2012-08-24 NOTE — Patient Instructions (Addendum)
Neurology - Lower extremity Parkinsonism - under the care of Dr. Erling Cruz and the [facility] clinic  Peripheral edema - not renal or heart failure but rather venous insufficiency. Plan  Knee high support hose - over the counter ` For days when the swelling is severe and uncomfortable it is ok to take furosemide 20 mg. If you needs this more than 2/wk we need to reassess.  Positional vertigo - the carotid bulb is not quite as sensitive as in the past. Plan  The "rule of 20," this will reduce the risk of falls.   Blood pressure - great control  Heart  - seem stable . Will defer to Dr. Lia Foyer  Cholesterol - last lab work revealed very good control. LDL 37 in June '13.  Health maintenance - current recommendation is for a mammogram every other year.   Venous Stasis and Chronic Venous Insufficiency As people age, the veins located in their legs may weaken and stretch. When veins weaken and lose the ability to pump blood effectively, the condition is called chronic venous insufficiency (CVI) or venous stasis. Almost all veins return blood back to the heart. This happens by:  The force of the heart pumping fresh blood pushes blood back to the heart.   Blood flowing to the heart from the force of gravity.  In the deep veins of the legs, blood has to fight gravity and flow upstream back to the heart. Here, the leg muscles contract to pump blood back toward the heart. Vein walls are elastic, and many veins have small valves that only allow blood to flow in one direction. When leg muscles contract, they push inward against the elastic vein walls. This squeezes blood upward, opens the valves, and moves blood toward the heart. When leg muscles relax, the vein wall also relaxes and the valves inside the vein close to prevent blood from flowing backward. This method of pumping blood out of the legs is called the venous pump. CAUSES   The venous pump works best while walking and leg muscles are contracting. But when  a person sits or stands, blood pressure in leg veins can build. Deep veins are usually able to withstand short periods of inactivity, but long periods of inactivity (and increased pressure) can stretch, weaken, and damage vein walls. High blood pressure can also stretch and damage vein walls. The veins may no longer be able to pump blood back to the heart. Venous hypertension (high blood pressure inside veins) that lasts over time is a primary cause of CVI. CVI can also be caused by:    Deep vein thrombosis, a condition where a thrombus (blood clot) blocks blood flow in a vein.   Phlebitis, an inflammation of a superficial vein that causes a blood clot to form.  Other risk factors for CVI may include:    Heredity.   Obesity.   Pregnancy.   Sedentary lifestyle.   Smoking.   Jobs requiring long periods of standing or sitting in one place.   Age and gender:   Women in their 64's and 86's and men in their 85's are more prone to developing CVI.  SYMPTOMS   Symptoms of CVI may include:    Varicose veins.   Ulceration or skin breakdown.   Lipodermatosclerosis, a condition that affects the skin just above the ankle, usually on the inside surface.  Over time the skin becomes brown, smooth, tight and often painful. Those with this condition have a high risk of developing skin ulcers.  Reddened or discolored skin on the leg.   Swelling.  DIAGNOSIS  Your caregiver can diagnose CVI after performing a careful medical history and physical examination. To confirm the diagnosis, the following tests may also be ordered:    Duplex ultrasound.   Plethysmography (tests blood flow).   Venograms (x-ray using a special dye).  TREATMENT The goals of treatment for CVI are to restore a person to an active life and to minimize pain or disability. Typically, CVI does not pose a serious threat to life or limb, and with proper treatment most people with this condition can continue to lead active lives.  In most cases, mild CVI can be treated on an outpatient basis with simple procedures. Treatment methods include:    Elastic compression socks.   Sclerotherapy, a procedure involving an injection of a material that "dissolves" the damaged veins. Other veins in the network of blood vessels take over the function of the damaged veins.   Vein stripping (an older procedure less commonly used).   Laser Ablation surgery.   Valve repair.  HOME CARE INSTRUCTIONS    Elastic compression socks must be worn every day. They can help with symptoms and lower the chances of the problem getting worse, but they do not cure the problem.   Only take over-the-counter or prescription medicines for pain, discomfort, or fever as directed by your caregiver.   Your caregiver will review your other medications with you.  SEEK MEDICAL CARE IF:    You are confused about how to take your medications.   There is redness, swelling, or increasing pain in the affected area.   There is a red streak or line that extends up or down from the affected area.   There is a breakdown or loss of skin in the affected area, even if the breakdown is small.   You develop an unexplained oral temperature above 102 F (38.9 C).   There is an injury to the affected area.  SEEK IMMEDIATE MEDICAL CARE IF:    There is an injury and open wound to the affected area.   Pain is not adequately relieved with pain medication prescribed or becomes severe.   An oral temperature above 102 F (38.9 C) develops.   The foot/ankle below the affected area becomes suddenly numb or the area feels weak and hard to move.  MAKE SURE YOU:    Understand these instructions.   Will watch your condition.   Will get help right away if you are not doing well or get worse.  Document Released: 03/01/2007 Document Revised: 01/18/2012 Document Reviewed: 05/09/2007 [facility] Patient Information 2013 [place].      Benign Positional  Vertigo Vertigo means you feel like you or your surroundings are moving when they are not. Benign positional vertigo is the most common form of vertigo. Benign means that the cause of your condition is not serious. Benign positional vertigo is more common in older adults. CAUSES   Benign positional vertigo is the result of an upset in the labyrinth system. This is an area in the middle ear that helps control your balance. This may be caused by a viral infection, head injury, or repetitive motion. However, often no specific cause is found. SYMPTOMS   Symptoms of benign positional vertigo occur when you move your head or eyes in different directions. Some of the symptoms may include:  Loss of balance and falls.   Vomiting.   Blurred vision.   Dizziness.   Nausea.  Involuntary eye movements (nystagmus).  DIAGNOSIS   Benign positional vertigo is usually diagnosed by physical exam. If the specific cause of your benign positional vertigo is unknown, your caregiver may perform imaging tests, such as magnetic resonance imaging (MRI) or computed tomography (CT). TREATMENT   Your caregiver may recommend movements or procedures to correct the benign positional vertigo. Medicines such as meclizine, benzodiazepines, and medicines for nausea may be used to treat your symptoms. In rare cases, if your symptoms are caused by certain conditions that affect the inner ear, you may need surgery. HOME CARE INSTRUCTIONS    Follow your caregiver's instructions.   Move slowly. Do not make sudden body or head movements.   Avoid driving.   Avoid operating heavy machinery.   Avoid performing any tasks that would be dangerous to you or others during a vertigo episode.   Drink enough fluids to keep your urine clear or pale yellow.  SEEK IMMEDIATE MEDICAL CARE IF:    You develop problems with walking, weakness, numbness, or using your arms, hands, or legs.   You have difficulty speaking.   You develop  severe headaches.   Your nausea or vomiting continues or gets worse.   You develop visual changes.   Your family or friends notice any behavioral changes.   Your condition gets worse.   You have a fever.   You develop a stiff neck or sensitivity to light.  MAKE SURE YOU:    Understand these instructions.   Will watch your condition.   Will get help right away if you are not doing well or get worse.  Document Released: 08/03/2006 Document Revised: 01/18/2012 Document Reviewed: 07/16/2011 [facility] Patient Information 2013 [place].

## 2012-08-25 NOTE — Assessment & Plan Note (Signed)
Lab Results  Component Value Date   TSH 0.55 05/27/2011   She may have had repeat thyroid functions at [facility] clinic this past spring. Her last TSH is in normal range.  Plan Continue present dose of levothyroxine.

## 2012-08-25 NOTE — Assessment & Plan Note (Signed)
Last lipid panel June '13 - good control with HDL better than goal of 50 and very low LDL (37)  Plan  Continue present regimen

## 2012-08-25 NOTE — Assessment & Plan Note (Signed)
Interval history is remarkable for on-going neurologic disease with progressive problems with LE weakness and balance issues. Physical exam, limited, is unremarkable. Reviewed recent labs - no acute abnormalities. She is current with colonoscopy-last study May '04 - due for follow-up May '14. She is current with mammography on an every two year schedule. Immunizations are up-to-date.  In summary - a very nice woman who is dealing with an unusual neurologic disease, the illness of her daughter and the deterioration of her very elderly mother. She has a very good attitude and demonstrates real fortitude in facing these travails. She will return as needed.

## 2012-09-14 ENCOUNTER — Encounter: Payer: Self-pay | Admitting: Cardiology

## 2012-09-14 ENCOUNTER — Ambulatory Visit (INDEPENDENT_AMBULATORY_CARE_PROVIDER_SITE_OTHER): Payer: Medicare Other | Admitting: Cardiology

## 2012-09-14 VITALS — BP 132/58 | HR 73 | Ht 63.0 in | Wt 145.0 lb

## 2012-09-14 DIAGNOSIS — I2510 Atherosclerotic heart disease of native coronary artery without angina pectoris: Secondary | ICD-10-CM

## 2012-09-14 DIAGNOSIS — I872 Venous insufficiency (chronic) (peripheral): Secondary | ICD-10-CM

## 2012-09-14 DIAGNOSIS — R600 Localized edema: Secondary | ICD-10-CM

## 2012-09-14 DIAGNOSIS — R609 Edema, unspecified: Secondary | ICD-10-CM

## 2012-09-14 NOTE — Progress Notes (Signed)
HPI:  She comes in today because of recurrent bilateral lower extremity edema which has been bothering her for a couple of months as this point.  She has not been having any chest pain.  She has been seen at the [facility] clinic for evaluation of the her neuro disorder.  The edema seems to improve when she is elevated, but despite her disabilities, she has remained very fairly active in terms of her activity.    Current Outpatient Prescriptions  Medication Sig Dispense Refill  . albuterol (PROAIR HFA) 108 (90 BASE) MCG/ACT inhaler Inhale 2 puffs into the lungs every 6 (six) hours as needed.        . beclomethasone (QVAR) 80 MCG/ACT inhaler Inhale 1 puff into the lungs as needed.        . Calcium Carbonate-Vitamin D (CALCIUM-VITAMIN D) 500-200 MG-UNIT per tablet Take 1 tablet by mouth daily.        . clonazePAM (KLONOPIN) 0.5 MG tablet Take 1 to 2 tablets a day.  60 tablet  5  . cyanocobalamin (,VITAMIN B-12,) 1000 MCG/ML injection Inject 1,000 mcg into the muscle every 30 (thirty) days.        . famotidine (PEPCID) 20 MG tablet Take 1 tablet (20 mg total) by mouth 2 (two) times daily.  60 tablet  6  . furosemide (LASIX) 20 MG tablet Take 1 tablet (20 mg total) by mouth as needed. For severe swelling with discomfort.  30 tablet  3  . isosorbide mononitrate (IMDUR) 30 MG 24 hr tablet TAKE 1/2 TABLET BY MOUTH DAILY  30 tablet  6  . mometasone (NASONEX) 50 MCG/ACT nasal spray 2 sprays by Nasal route as needed.       . nitroGLYCERIN (NITROSTAT) 0.4 MG SL tablet Place 0.4 mg under the tongue every 5 (five) minutes as needed.        . propranolol (INDERAL) 10 MG tablet TAKE 1 TABLET BY MOUTH THREE TIMES A DAY  180 tablet  1  . rosuvastatin (CRESTOR) 20 MG tablet Take 0.5 tablets (10 mg total) by mouth daily.  1 tablet  0  . sertraline (ZOLOFT) 25 MG tablet TAKE 1 TABLET BY MOUTH EVERY MORNING  30 tablet  11  . SYNTHROID 100 MCG tablet TAKE 1 TABLET BY MOUTH DAILY.  30 tablet  6  . trimethoprim (TRIMPEX)  100 MG tablet Take 100 mg by mouth daily.          No Known Allergies  Past Medical History  Diagnosis Date  . GERD (gastroesophageal reflux disease)   . Familial tremor     followed by Dr. Erling Cruz  . Unspecified chronic bronchitis   . Vaginitis   . Irritable bladder   . Pneumonia   . Hypothyroidism   . Depression   . Asthma   . CAD (coronary artery disease)     myoview 5/08:  EF 76%, no scar, no ischemia, +ECG changes with exercise;  cath 04/08/07:  pLAD 30%, mLAD 60-70% - med tx.  Marland Kitchen HLD (hyperlipidemia)     Past Surgical History  Procedure Date  . Laparoscopic nissen fundoplication   . Cystectomy   . Tumor removal   . Tubal ligation     Family History  Problem Relation Age of Onset  . Coronary artery disease    . Heart attack    . Hypertension    . Diabetes    . Breast cancer    . Hypertension Mother   . Heart disease Father  History   Social History  . Marital Status: Married    Spouse Name: N/A    Number of Children: N/A  . Years of Education: N/A   Occupational History  . Not on file.   Social History Main Topics  . Smoking status: Never Smoker   . Smokeless tobacco: Never Used  . Alcohol Use: No  . Drug Use: No  . Sexually Active: Not Currently   Other Topics Concern  . Not on file   Social History Narrative   HSG, retired from [employer] . married '60. 1 son - '65; 1 daughter - '61; 2 grandchildren; 1 great-grandchild. Homemaker. marriage in good health. 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 Positive history of passive tobacco smoke exposure.    ROS: Please see the HPI.  All other systems reviewed and negative.  PHYSICAL EXAM:  BP 132/58  Pulse 73  Ht 5' 3"  (1.6 m)  Wt 145 lb (65.772 kg)  BMI 25.69 kg/m2  SpO2 97%  General: Well developed, well nourished, in no acute distress. Head:  Normocephalic and atraumatic. Neck: no JVD.  This was carefully assessed.   Lungs: Clear to auscultation and percussion. Heart: Normal S1 and S2.  No murmur,  rubs or gallops.   ?S4 gallop.   Abdomen:  Normal bowel sounds; soft; non tender; no organomegaly Pulses: Pulses normal in all 4 extremities. Extremities: No clubbing or cyanosis. 1-2 plus lower extremity edema bilaterally.   Neurologic: Alert and oriented x 3.  EKG:  NSR.  WNL.     ASSESSMENT AND PLAN:

## 2012-09-14 NOTE — Patient Instructions (Signed)
Your physician has requested that you have a lower extremity venous duplex. This test is an ultrasound of the veins in the legs. It looks at venous blood flow that carries blood from the heart to the legs. Allow one hour for a Lower Venous exam.  There are no restrictions or special instructions.  Your physician has requested that you have an echocardiogram. Echocardiography is a painless test that uses sound waves to create images of your heart. It provides your doctor with information about the size and shape of your heart and how well your heart's chambers and valves are working. This procedure takes approximately one hour. There are no restrictions for this procedure.  Your physician recommends that you schedule a follow-up appointment in: [place] physician recommends that you continue on your current medications as directed. Please refer to the Current Medication list given to you today.

## 2012-09-16 ENCOUNTER — Encounter (INDEPENDENT_AMBULATORY_CARE_PROVIDER_SITE_OTHER): Payer: Medicare Other

## 2012-09-16 DIAGNOSIS — I2510 Atherosclerotic heart disease of native coronary artery without angina pectoris: Secondary | ICD-10-CM

## 2012-09-16 DIAGNOSIS — R609 Edema, unspecified: Secondary | ICD-10-CM

## 2012-09-20 ENCOUNTER — Ambulatory Visit (HOSPITAL_COMMUNITY): Payer: Medicare Other | Attending: Cardiology

## 2012-09-20 DIAGNOSIS — E785 Hyperlipidemia, unspecified: Secondary | ICD-10-CM | POA: Insufficient documentation

## 2012-09-20 DIAGNOSIS — I2510 Atherosclerotic heart disease of native coronary artery without angina pectoris: Secondary | ICD-10-CM

## 2012-09-20 DIAGNOSIS — R609 Edema, unspecified: Secondary | ICD-10-CM | POA: Insufficient documentation

## 2012-09-20 NOTE — Progress Notes (Signed)
Echocardiogram performed.  

## 2012-09-22 ENCOUNTER — Encounter: Payer: Self-pay | Admitting: Cardiology

## 2012-09-22 ENCOUNTER — Ambulatory Visit: Payer: Medicare Other | Attending: Neurology | Admitting: Physical Therapy

## 2012-09-22 DIAGNOSIS — M6281 Muscle weakness (generalized): Secondary | ICD-10-CM | POA: Insufficient documentation

## 2012-09-22 DIAGNOSIS — IMO0001 Reserved for inherently not codable concepts without codable children: Secondary | ICD-10-CM | POA: Insufficient documentation

## 2012-09-22 DIAGNOSIS — R269 Unspecified abnormalities of gait and mobility: Secondary | ICD-10-CM | POA: Insufficient documentation

## 2012-09-22 NOTE — Telephone Encounter (Signed)
This encounter was created in error - please disregard.

## 2012-09-22 NOTE — Telephone Encounter (Signed)
New problem:    Echo test results.

## 2012-09-26 ENCOUNTER — Ambulatory Visit: Payer: Medicare Other | Admitting: Physical Therapy

## 2012-09-26 NOTE — Assessment & Plan Note (Signed)
Will assess with 2D echocardiogram to look for potential sources.  Neck veins are flat, and there is little evidence to suggest CHF as etiology.

## 2012-09-26 NOTE — Assessment & Plan Note (Signed)
Will get some venous dopplers to exclude lower extremity DVT.

## 2012-09-26 NOTE — Assessment & Plan Note (Signed)
No significant symptoms at this point.

## 2012-09-29 ENCOUNTER — Ambulatory Visit: Payer: Medicare Other | Admitting: Physical Therapy

## 2012-10-02 ENCOUNTER — Other Ambulatory Visit: Payer: Self-pay | Admitting: Internal Medicine

## 2012-10-04 ENCOUNTER — Ambulatory Visit: Payer: Medicare Other | Admitting: Physical Therapy

## 2012-10-07 ENCOUNTER — Other Ambulatory Visit: Payer: Self-pay | Admitting: Internal Medicine

## 2012-10-07 DIAGNOSIS — Z1231 Encounter for screening mammogram for malignant neoplasm of breast: Secondary | ICD-10-CM

## 2012-10-10 ENCOUNTER — Ambulatory Visit (INDEPENDENT_AMBULATORY_CARE_PROVIDER_SITE_OTHER): Payer: Medicare Other | Admitting: Internal Medicine

## 2012-10-10 ENCOUNTER — Ambulatory Visit: Payer: Medicare Other | Attending: Neurology | Admitting: Physical Therapy

## 2012-10-10 ENCOUNTER — Encounter: Payer: Self-pay | Admitting: Internal Medicine

## 2012-10-10 VITALS — BP 122/70 | HR 72 | Temp 97.6°F | Resp 12 | Wt 149.1 lb

## 2012-10-10 DIAGNOSIS — M6281 Muscle weakness (generalized): Secondary | ICD-10-CM | POA: Insufficient documentation

## 2012-10-10 DIAGNOSIS — I872 Venous insufficiency (chronic) (peripheral): Secondary | ICD-10-CM

## 2012-10-10 DIAGNOSIS — R269 Unspecified abnormalities of gait and mobility: Secondary | ICD-10-CM | POA: Insufficient documentation

## 2012-10-10 DIAGNOSIS — IMO0001 Reserved for inherently not codable concepts without codable children: Secondary | ICD-10-CM | POA: Insufficient documentation

## 2012-10-10 DIAGNOSIS — N63 Unspecified lump in unspecified breast: Secondary | ICD-10-CM

## 2012-10-10 NOTE — Patient Instructions (Addendum)
Thank you for coming to see [place]. Your leg swelling is likely caused by venous insufficiency. Here is what we recommend:  1. Discontinue Lasix There is no evidence of heart or renal failure, so you don't need a diuretic.  2. Wear over-the-counter hosiery (panty hose) Ask for the "worker girl's hosiery" which have more elastic. If that is not sufficient, we will try prescription-strength compression stockings.  3. Elevate your legs Elevate your legs and feet above the level of your heart - aim for 15-20 minutes every 3 to 4 hours.   Here is some more information on venous insufficicency if you would like it:  Venous Stasis and Chronic Venous Insufficiency As people age, the veins located in their legs may weaken and stretch. When veins weaken and lose the ability to pump blood effectively, the condition is called chronic venous insufficiency (CVI) or venous stasis. Almost all veins return blood back to the heart. This happens by:  The force of the heart pumping fresh blood pushes blood back to the heart.   Blood flowing to the heart from the force of gravity.  In the deep veins of the legs, blood has to fight gravity and flow upstream back to the heart. Here, the leg muscles contract to pump blood back toward the heart. Vein walls are elastic, and many veins have small valves that only allow blood to flow in one direction. When leg muscles contract, they push inward against the elastic vein walls. This squeezes blood upward, opens the valves, and moves blood toward the heart. When leg muscles relax, the vein wall also relaxes and the valves inside the vein close to prevent blood from flowing backward. This method of pumping blood out of the legs is called the venous pump. CAUSES   The venous pump works best while walking and leg muscles are contracting. But when a person sits or stands, blood pressure in leg veins can build. Deep veins are usually able to withstand short periods of inactivity, but  long periods of inactivity (and increased pressure) can stretch, weaken, and damage vein walls. High blood pressure can also stretch and damage vein walls. The veins may no longer be able to pump blood back to the heart. Venous hypertension (high blood pressure inside veins) that lasts over time is a primary cause of CVI. CVI can also be caused by:    Deep vein thrombosis, a condition where a thrombus (blood clot) blocks blood flow in a vein.   Phlebitis, an inflammation of a superficial vein that causes a blood clot to form.  Other risk factors for CVI may include:    Heredity.   Obesity.   Pregnancy.   Sedentary lifestyle.   Smoking.   Jobs requiring long periods of standing or sitting in one place.   Age and gender:   Women in their 48's and 60's and men in their 60's are more prone to developing CVI.  SYMPTOMS   Symptoms of CVI may include:    Varicose veins.   Ulceration or skin breakdown.   Lipodermatosclerosis, a condition that affects the skin just above the ankle, usually on the inside surface.  Over time the skin becomes brown, smooth, tight and often painful. Those with this condition have a high risk of developing skin ulcers.   Reddened or discolored skin on the leg.   Swelling.  DIAGNOSIS  Your caregiver can diagnose CVI after performing a careful medical history and physical examination. To confirm the diagnosis, the following tests  may also be ordered:    Duplex ultrasound.   Plethysmography (tests blood flow).   Venograms (x-ray using a special dye).  TREATMENT The goals of treatment for CVI are to restore a person to an active life and to minimize pain or disability. Typically, CVI does not pose a serious threat to life or limb, and with proper treatment most people with this condition can continue to lead active lives. In most cases, mild CVI can be treated on an outpatient basis with simple procedures. Treatment methods include:    Elastic compression  socks.   Sclerotherapy, a procedure involving an injection of a material that "dissolves" the damaged veins. Other veins in the network of blood vessels take over the function of the damaged veins.   Vein stripping (an older procedure less commonly used).   Laser Ablation surgery.   Valve repair.  HOME CARE INSTRUCTIONS    Elastic compression socks must be worn every day. They can help with symptoms and lower the chances of the problem getting worse, but they do not cure the problem.   Only take over-the-counter or prescription medicines for pain, discomfort, or fever as directed by your caregiver.   Your caregiver will review your other medications with you.  SEEK MEDICAL CARE IF:    You are confused about how to take your medications.   There is redness, swelling, or increasing pain in the affected area.   There is a red streak or line that extends up or down from the affected area.   There is a breakdown or loss of skin in the affected area, even if the breakdown is small.   You develop an unexplained oral temperature above 102 F (38.9 C).   There is an injury to the affected area.  SEEK IMMEDIATE MEDICAL CARE IF:    There is an injury and open wound to the affected area.   Pain is not adequately relieved with pain medication prescribed or becomes severe.   An oral temperature above 102 F (38.9 C) develops.   The foot/ankle below the affected area becomes suddenly numb or the area feels weak and hard to move.  MAKE SURE YOU:    Understand these instructions.   Will watch your condition.   Will get help right away if you are not doing well or get worse.  Document Released: 03/01/2007 Document Revised: 01/18/2012 Document Reviewed: 05/09/2007 [facility] Patient Information 2013 [place].

## 2012-10-10 NOTE — Progress Notes (Signed)
Patient ID: [name] [name], female   DOB: 1940-12-31, 71 y.o.   MRN: [id number]  HPI [name] [name] is a 71 year old very pleasant lady who presents with foot and leg swelling. She was seen recently by Dr. Linda Hedges for this same issue. The swelling affects both legs and feet and has been present about 3 months. She had a Doppler recently and was found to not have any blood clots. An echo was also performed and confirmed no change in the heart. Dr. Linda Hedges prescribed Lasix for the swelling as needed, although her blood pressure is not high so she is only taking the Lasix only 2 times a week. She did not notice a change in the swelling with the Lasix. Her legs do not feel hot or warm, but when she touches them she feels a stinging sensation. She props up her feet regularly, sometimes above the level of her heart but not always. Her diet has changed in the last year and a half to include more processed and quick foods, due to a daughter in a nursing home with MS and 2 strokes and her mother in a nursing home as well.   Patient goes to physical therapy twice a week for lower extremity Parkinsonism.   ROS -Denies fevers, chills, nocturnal shortness of breath. Sleeps on one pillow.   Past Medical History  Diagnosis Date  . GERD (gastroesophageal reflux disease)   . Familial tremor     followed by Dr. Erling Cruz  . Unspecified chronic bronchitis   . Vaginitis   . Irritable bladder   . Pneumonia   . Hypothyroidism   . Depression   . Asthma   . CAD (coronary artery disease)     myoview 5/08:  EF 76%, no scar, no ischemia, +ECG changes with exercise;  cath 04/08/07:  pLAD 30%, mLAD 60-70% - med tx.  Marland Kitchen HLD (hyperlipidemia)    Past Surgical History  Procedure Date  . Laparoscopic nissen fundoplication   . Cystectomy   . Tumor removal   . Tubal ligation    Family History  Problem Relation Age of Onset  . Coronary artery disease    . Heart attack    . Hypertension    . Diabetes    . Breast cancer    .  Hypertension Mother   . Heart disease Father    History   Social History  . Marital Status: Married    Spouse Name: N/A    Number of Children: N/A  . Years of Education: N/A   Occupational History  . Not on file.   Social History Main Topics  . Smoking status: Never Smoker   . Smokeless tobacco: Never Used  . Alcohol Use: No  . Drug Use: No  . Sexually Active: Not Currently   Other Topics Concern  . Not on file   Social History Narrative   HSG, retired from [employer] . married '60. 1 son - '65; 1 daughter - '61; 2 grandchildren; 1 great-grandchild. Homemaker. marriage in good health. 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 Positive history of passive tobacco smoke exposure.   Current Outpatient Prescriptions on File Prior to Visit  Medication Sig Dispense Refill  . albuterol (PROAIR HFA) 108 (90 BASE) MCG/ACT inhaler Inhale 2 puffs into the lungs every 6 (six) hours as needed.        . beclomethasone (QVAR) 80 MCG/ACT inhaler Inhale 1 puff into the lungs as needed.        . Calcium Carbonate-Vitamin  D (CALCIUM-VITAMIN D) 500-200 MG-UNIT per tablet Take 1 tablet by mouth daily.        . clonazePAM (KLONOPIN) 0.5 MG tablet Take 1 to 2 tablets a day.  60 tablet  5  . cyanocobalamin (,VITAMIN B-12,) 1000 MCG/ML injection Inject 1,000 mcg into the muscle every 30 (thirty) days.        . famotidine (PEPCID) 20 MG tablet Take 1 tablet (20 mg total) by mouth 2 (two) times daily.  60 tablet  6  . furosemide (LASIX) 20 MG tablet Take 1 tablet (20 mg total) by mouth as needed. For severe swelling with discomfort.  30 tablet  3  . isosorbide mononitrate (IMDUR) 30 MG 24 hr tablet TAKE 1/2 TABLET BY MOUTH DAILY  30 tablet  6  . mometasone (NASONEX) 50 MCG/ACT nasal spray 2 sprays by Nasal route as needed.       . nitroGLYCERIN (NITROSTAT) 0.4 MG SL tablet Place 0.4 mg under the tongue every 5 (five) minutes as needed.        . propranolol (INDERAL) 10 MG tablet TAKE 1 TABLET BY MOUTH THREE TIMES A  DAY  180 tablet  1  . rosuvastatin (CRESTOR) 20 MG tablet Take 0.5 tablets (10 mg total) by mouth daily.  1 tablet  0  . sertraline (ZOLOFT) 25 MG tablet TAKE 1 TABLET BY MOUTH EVERY MORNING  30 tablet  11  . SYNTHROID 100 MCG tablet TAKE 1 TABLET BY MOUTH DAILY.  30 tablet  6  . trimethoprim (TRIMPEX) 100 MG tablet Take 100 mg by mouth daily.         PE General: Pleasant lady with voice tremor in NAD CV: Regular rate and rhythm, S1 and S2 present. Pulse regular. Pulm: Lungs clear to auscultation bilaterally Extremities: 2+ pitting edema to the knees of bilateral lower extremities Neuro: Alert and appropriate. No focal deficits.  A/P [name] [name] is a 71 year old lady who presents with foot and leg swelling.  # Bilateral lower extremity swelling - likely secondary to venous insufficiency -Discontinue Lasix since no evidence of heart or renal failure -Recommend over-the-counter hosiery (panty hose). If that is not sufficient, we will try prescription-strength compression stockings -Elevate legs above level of heart  Patient interviewed and examined. I agree with the assessment and plan per Ms. Satina Jerrell, MS III  M. Norins, MD

## 2012-10-11 NOTE — Assessment & Plan Note (Signed)
Reviewed mechanism of problem, including cartoon. She has had 2 D echo and LE venous doppler studies with no cause for anasarca/peripheral edema identified.  Plan No diuretics at this time  Support hosiery and elevation of the legs.

## 2012-10-13 ENCOUNTER — Other Ambulatory Visit: Payer: Self-pay | Admitting: Internal Medicine

## 2012-10-13 ENCOUNTER — Ambulatory Visit: Payer: Medicare Other | Admitting: Physical Therapy

## 2012-10-13 DIAGNOSIS — N63 Unspecified lump in unspecified breast: Secondary | ICD-10-CM

## 2012-10-17 ENCOUNTER — Ambulatory Visit: Payer: Medicare Other | Admitting: Physical Therapy

## 2012-10-20 ENCOUNTER — Ambulatory Visit: Payer: Medicare Other | Admitting: Physical Therapy

## 2012-10-21 ENCOUNTER — Other Ambulatory Visit: Payer: Medicare Other

## 2012-10-24 ENCOUNTER — Ambulatory Visit: Payer: Medicare Other | Admitting: Physical Therapy

## 2012-10-24 ENCOUNTER — Ambulatory Visit: Payer: Medicare Other | Admitting: Internal Medicine

## 2012-10-27 ENCOUNTER — Ambulatory Visit: Payer: Medicare Other | Admitting: Physical Therapy

## 2012-10-31 ENCOUNTER — Other Ambulatory Visit: Payer: Medicare Other

## 2012-11-03 ENCOUNTER — Ambulatory Visit: Payer: Medicare Other | Admitting: Physical Therapy

## 2012-11-07 ENCOUNTER — Ambulatory Visit: Payer: Medicare Other | Admitting: Physical Therapy

## 2012-11-08 ENCOUNTER — Ambulatory Visit: Payer: Medicare Other | Admitting: Physical Therapy

## 2012-11-08 ENCOUNTER — Ambulatory Visit
Admission: RE | Admit: 2012-11-08 | Discharge: 2012-11-08 | Disposition: A | Discharge status: Home / Self Care | Payer: Medicare Other | Source: Ambulatory Visit | Attending: Internal Medicine | Admitting: Internal Medicine

## 2012-11-08 ENCOUNTER — Other Ambulatory Visit: Payer: Self-pay | Admitting: Internal Medicine

## 2012-11-08 DIAGNOSIS — N63 Unspecified lump in unspecified breast: Secondary | ICD-10-CM

## 2012-11-10 ENCOUNTER — Ambulatory Visit: Payer: Medicare Other | Attending: Neurology | Admitting: Physical Therapy

## 2012-11-10 ENCOUNTER — Telehealth: Payer: Self-pay | Admitting: Internal Medicine

## 2012-11-10 DIAGNOSIS — IMO0001 Reserved for inherently not codable concepts without codable children: Secondary | ICD-10-CM | POA: Insufficient documentation

## 2012-11-10 DIAGNOSIS — R269 Unspecified abnormalities of gait and mobility: Secondary | ICD-10-CM | POA: Insufficient documentation

## 2012-11-10 DIAGNOSIS — M6281 Muscle weakness (generalized): Secondary | ICD-10-CM | POA: Insufficient documentation

## 2012-11-10 NOTE — Telephone Encounter (Signed)
Called pt and lvm telling her per Dr. Linda Hedges pt may take furosemide 20 mg once a day, every day. For the itching, my use OTC claritin 10 mg twice a day as needed. Also, if there was no change to call us back.

## 2012-11-10 NOTE — Telephone Encounter (Signed)
Please read call a nurse note and advise.

## 2012-11-10 NOTE — Telephone Encounter (Signed)
Patient Information:  Caller Name: Ronia  Phone: [phone]  Patient: [name] [name], [name] [name]  Gender: Female  DOB: [birth year]  Age: 72 Years  PCP: Adella Hare (Adults only)  Office Follow Up:  Does the office need to follow up with this patient?: Yes  Instructions For The Office: asking for medication for itching  RN Note:  [phone] cell Suggested cool compresses with water and baking soda prn.   She is asking for a prn medication for the itching.  States that the swelling never completely goes away in her ankles even after sleeping.  She only takes the Lasix 67m twice a week.  Symptoms  Reason For Call & Symptoms: Has itching on her ankles where they are swollen.  Has a slight rash also.  Reviewed Health History In EMR: Yes  Reviewed Medications In EMR: Yes  Reviewed Allergies In EMR: Yes  Reviewed Surgeries / Procedures: Yes  Date of Onset of Symptoms: 10/27/2012  Guideline(s) Used:  Itching - Localized  Disposition Per Guideline:   Home Care  Reason For Disposition Reached:   Mild localized itching  Advice Given:  N/A

## 2012-11-10 NOTE — Telephone Encounter (Signed)
1. May take furosemide 20 mg once a day, every day 2. For itching may use otc claritin 10 mg twice a day as needed.

## 2012-11-15 ENCOUNTER — Telehealth: Payer: Self-pay | Admitting: Internal Medicine

## 2012-11-15 ENCOUNTER — Ambulatory Visit: Payer: Medicare Other | Admitting: Physical Therapy

## 2012-11-15 NOTE — Telephone Encounter (Signed)
Patient Information:  Caller Name: Reve  Phone: [phone]  Patient: [name] [name], [name] [name]  Gender: Female  DOB: [birth year]  Age: 72 Years  PCP: Adella Hare (Adults only)  Office Follow Up:  Does the office need to follow up with this patient?: No  Instructions For The Office: N/A  RN Note:  RN advised for patient to try the OTC Hydrocortisone Cream 1% 4 times a day and if symptoms were not improved by tomorrow (11/16/12) to call the office back for an office visit.  Pt agrees to try cream.  Symptoms  Reason For Call & Symptoms: Pt states she has swelling in her legs and it is being treated by Dr Linda Hedges with Lasix,.  Pt states her legs became broke out with rash and itching which Dr Linda Hedges advised her to take Claritin.  Pt states the Claritin is not helping the itching and has gotten worse.  Reviewed Health History In EMR: Yes  Reviewed Medications In EMR: Yes  Reviewed Allergies In EMR: Yes  Reviewed Surgeries / Procedures: Yes  Date of Onset of Symptoms: 11/10/2012  Treatments Tried: Claritin , alcohol to legs  Treatments Tried Worked: No  Guideline(s) Used:  Itching - Localized  Disposition Per Guideline:   See Within 3 Days in Office  Reason For Disposition Reached:   MODERATE-SEVERE local itching (i.e., interferes with work, school, activities) and not improved after 24 hours of hydrocortisone cream  Advice Given:  Hydrocortisone Cream For Itching:  Apply hydrocortisone cream 4 times a day. Use it for a couple days, until the itch is mild.  Available OTC in [place] as 0.5% and 1% cream.  Call Back If:  You become worse.  Ice Cube:  For flare-ups of itching, rub the area with a small ice cube for 5 minutes.

## 2012-11-17 ENCOUNTER — Ambulatory Visit: Payer: Medicare Other | Admitting: Physical Therapy

## 2012-11-17 ENCOUNTER — Encounter: Payer: Self-pay | Admitting: Internal Medicine

## 2012-11-17 ENCOUNTER — Ambulatory Visit (INDEPENDENT_AMBULATORY_CARE_PROVIDER_SITE_OTHER): Payer: Medicare Other | Admitting: Internal Medicine

## 2012-11-17 VITALS — BP 124/68 | HR 75 | Temp 97.3°F | Resp 12 | Wt 147.1 lb

## 2012-11-17 DIAGNOSIS — I872 Venous insufficiency (chronic) (peripheral): Secondary | ICD-10-CM

## 2012-11-17 NOTE — Patient Instructions (Addendum)
Peripheral edema - no heart failure, normal kidney function. The cause of the swelling is venous insufficiency - a problem with venous valvules and gravity.  Plan Support hose, starting with over the counter support hosiery. If this fails we can go to Rx compression hose  Elevate your legs during the day - at least 2 15 minute breaks with your feet higher than your heart  Use moisturizing lotions  Do NOT use steroid creams or antibacterial creams/ointments  For itching - first try the claritin 10 mg twice a day and Pepcid 20 mg twice a day. If this fails we can try some other products.   Venous Stasis and Chronic Venous Insufficiency As people age, the veins located in their legs may weaken and stretch. When veins weaken and lose the ability to pump blood effectively, the condition is called chronic venous insufficiency (CVI) or venous stasis. Almost all veins return blood back to the heart. This happens by:  The force of the heart pumping fresh blood pushes blood back to the heart.   Blood flowing to the heart from the force of gravity.  In the deep veins of the legs, blood has to fight gravity and flow upstream back to the heart. Here, the leg muscles contract to pump blood back toward the heart. Vein walls are elastic, and many veins have small valves that only allow blood to flow in one direction. When leg muscles contract, they push inward against the elastic vein walls. This squeezes blood upward, opens the valves, and moves blood toward the heart. When leg muscles relax, the vein wall also relaxes and the valves inside the vein close to prevent blood from flowing backward. This method of pumping blood out of the legs is called the venous pump. CAUSES   The venous pump works best while walking and leg muscles are contracting. But when a person sits or stands, blood pressure in leg veins can build. Deep veins are usually able to withstand short periods of inactivity, but long periods of  inactivity (and increased pressure) can stretch, weaken, and damage vein walls. High blood pressure can also stretch and damage vein walls. The veins may no longer be able to pump blood back to the heart. Venous hypertension (high blood pressure inside veins) that lasts over time is a primary cause of CVI. CVI can also be caused by:    Deep vein thrombosis, a condition where a thrombus (blood clot) blocks blood flow in a vein.   Phlebitis, an inflammation of a superficial vein that causes a blood clot to form.  Other risk factors for CVI may include:    Heredity.   Obesity.   Pregnancy.   Sedentary lifestyle.   Smoking.   Jobs requiring long periods of standing or sitting in one place.   Age and gender:   Women in their 3's and 43's and men in their 40's are more prone to developing CVI.  SYMPTOMS   Symptoms of CVI may include:    Varicose veins.   Ulceration or skin breakdown.   Lipodermatosclerosis, a condition that affects the skin just above the ankle, usually on the inside surface.  Over time the skin becomes brown, smooth, tight and often painful. Those with this condition have a high risk of developing skin ulcers.   Reddened or discolored skin on the leg.   Swelling.  DIAGNOSIS  Your caregiver can diagnose CVI after performing a careful medical history and physical examination. To confirm the diagnosis, the following  tests may also be ordered:    Duplex ultrasound.   Plethysmography (tests blood flow).   Venograms (x-ray using a special dye).  TREATMENT The goals of treatment for CVI are to restore a person to an active life and to minimize pain or disability. Typically, CVI does not pose a serious threat to life or limb, and with proper treatment most people with this condition can continue to lead active lives. In most cases, mild CVI can be treated on an outpatient basis with simple procedures. Treatment methods include:    Elastic compression socks.    Sclerotherapy, a procedure involving an injection of a material that "dissolves" the damaged veins. Other veins in the network of blood vessels take over the function of the damaged veins.   Vein stripping (an older procedure less commonly used).   Laser Ablation surgery.   Valve repair.  HOME CARE INSTRUCTIONS    Elastic compression socks must be worn every day. They can help with symptoms and lower the chances of the problem getting worse, but they do not cure the problem.   Only take over-the-counter or prescription medicines for pain, discomfort, or fever as directed by your caregiver.   Your caregiver will review your other medications with you.  SEEK MEDICAL CARE IF:    You are confused about how to take your medications.   There is redness, swelling, or increasing pain in the affected area.   There is a red streak or line that extends up or down from the affected area.   There is a breakdown or loss of skin in the affected area, even if the breakdown is small.   You develop an unexplained oral temperature above 102 F (38.9 C).   There is an injury to the affected area.  SEEK IMMEDIATE MEDICAL CARE IF:    There is an injury and open wound to the affected area.   Pain is not adequately relieved with pain medication prescribed or becomes severe.   An oral temperature above 102 F (38.9 C) develops.   The foot/ankle below the affected area becomes suddenly numb or the area feels weak and hard to move.  MAKE SURE YOU:    Understand these instructions.   Will watch your condition.   Will get help right away if you are not doing well or get worse.  Document Released: 03/01/2007 Document Revised: 01/18/2012 Document Reviewed: 05/09/2007 [facility] Patient Information 2013 [employer].

## 2012-11-18 ENCOUNTER — Ambulatory Visit: Payer: Medicare Other | Admitting: Internal Medicine

## 2012-11-20 NOTE — Progress Notes (Signed)
Subjective:    Patient ID: [name] [name], female    DOB: Dec 12, 1940, 72 y.o.   MRN: [id number]  HPI Mrs. Moder presents for evaluation of LE edema: this will worsen over the course of the day and resolve or improve overnight. Although she will have discomfort and sensitivity she has not had any skin breakdown. She has had routine lab with no evidence of renal insufficiency; cardiac evaluation with no evidence of biventricular failure. Medications reviewed and there are no culprit meds.  PMH, FamHx and SocHx reviewed for any changes and relevance. Current Outpatient Prescriptions on File Prior to Visit  Medication Sig Dispense Refill  . albuterol (PROAIR HFA) 108 (90 BASE) MCG/ACT inhaler Inhale 2 puffs into the lungs every 6 (six) hours as needed.        . beclomethasone (QVAR) 80 MCG/ACT inhaler Inhale 1 puff into the lungs as needed.        . Calcium Carbonate-Vitamin D (CALCIUM-VITAMIN D) 500-200 MG-UNIT per tablet Take 1 tablet by mouth daily.        . clonazePAM (KLONOPIN) 0.5 MG tablet Take 1 to 2 tablets a day.  60 tablet  5  . cyanocobalamin (,VITAMIN B-12,) 1000 MCG/ML injection Inject 1,000 mcg into the muscle every 30 (thirty) days.        . famotidine (PEPCID) 20 MG tablet Take 1 tablet (20 mg total) by mouth 2 (two) times daily.  60 tablet  6  . furosemide (LASIX) 20 MG tablet Take 20 mg by mouth daily. For severe swelling with discomfort.      . isosorbide mononitrate (IMDUR) 30 MG 24 hr tablet TAKE 1/2 TABLET BY MOUTH DAILY  30 tablet  6  . loratadine (CLARITIN) 10 MG tablet Take 10 mg by mouth 2 (two) times daily.      . mometasone (NASONEX) 50 MCG/ACT nasal spray 2 sprays by Nasal route as needed.       . nitroGLYCERIN (NITROSTAT) 0.4 MG SL tablet Place 0.4 mg under the tongue every 5 (five) minutes as needed.        . propranolol (INDERAL) 10 MG tablet TAKE 1 TABLET BY MOUTH THREE TIMES A DAY  180 tablet  1  . rosuvastatin (CRESTOR) 20 MG tablet Take 0.5 tablets (10 mg  total) by mouth daily.  1 tablet  0  . sertraline (ZOLOFT) 25 MG tablet TAKE 1 TABLET BY MOUTH EVERY MORNING  30 tablet  11  . SYNTHROID 100 MCG tablet TAKE 1 TABLET BY MOUTH DAILY.  30 tablet  6  . trimethoprim (TRIMPEX) 100 MG tablet Take 100 mg by mouth daily.            Review of Systems  Constitutional: Positive for activity change. Negative for fever and appetite change.  HENT: Negative for congestion, sore throat, facial swelling, trouble swallowing, sinus pressure and tinnitus.   Eyes: Negative.   Respiratory: Negative.   Cardiovascular: Positive for leg swelling.  Gastrointestinal: Negative.   Genitourinary: Negative for dysuria, frequency, decreased urine volume, enuresis and difficulty urinating.  Musculoskeletal: Negative.   Skin: Negative.   Neurological: Positive for tremors and weakness. Negative for dizziness, speech difficulty and numbness.  Hematological: Negative.   Psychiatric/Behavioral: Negative.        Objective:   Physical Exam Filed Vitals:   11/17/12 1614  BP: 124/68  Pulse: 75  Temp: 97.3 F (36.3 C)  Resp: 12   gen'l - WNWD mildy overweight white woman in no distress  W/ a  resting tremor HEENT C&S clear Cor - RRR, no JVD, 1-2 + peripheral edema mld pitting Pulm - normal respirations       Assessment & Plan:

## 2012-11-20 NOTE — Assessment & Plan Note (Signed)
No evidence of renal failure or right sided heart failure; no evidence of systemic fluid overload; edema worsens over the course of the day and improves over night with elevation of the legs - c/w venous insufficiency.  Plan Elevate legs during the day  otc knee high or pantyhose support stockings. If this fails - Rx compression stockings.  Patient ed w/ handout provided

## 2012-11-21 ENCOUNTER — Ambulatory Visit: Payer: Medicare Other | Admitting: Physical Therapy

## 2012-11-22 ENCOUNTER — Ambulatory Visit: Payer: Medicare Other | Admitting: Physical Therapy

## 2012-11-24 ENCOUNTER — Ambulatory Visit: Payer: Medicare Other | Admitting: Physical Therapy

## 2012-11-29 ENCOUNTER — Ambulatory Visit: Payer: Medicare Other | Admitting: Physical Therapy

## 2012-12-01 ENCOUNTER — Ambulatory Visit: Payer: Medicare Other | Admitting: Physical Therapy

## 2012-12-05 ENCOUNTER — Ambulatory Visit: Payer: Medicare Other | Admitting: Physical Therapy

## 2012-12-06 ENCOUNTER — Ambulatory Visit: Payer: Medicare Other | Admitting: Physical Therapy

## 2012-12-08 ENCOUNTER — Ambulatory Visit: Payer: Medicare Other | Admitting: Physical Therapy

## 2012-12-12 ENCOUNTER — Ambulatory Visit: Payer: Medicare Other | Attending: Neurology | Admitting: Physical Therapy

## 2012-12-12 DIAGNOSIS — IMO0001 Reserved for inherently not codable concepts without codable children: Secondary | ICD-10-CM | POA: Insufficient documentation

## 2012-12-12 DIAGNOSIS — R269 Unspecified abnormalities of gait and mobility: Secondary | ICD-10-CM | POA: Insufficient documentation

## 2012-12-12 DIAGNOSIS — M6281 Muscle weakness (generalized): Secondary | ICD-10-CM | POA: Insufficient documentation

## 2012-12-15 ENCOUNTER — Ambulatory Visit (INDEPENDENT_AMBULATORY_CARE_PROVIDER_SITE_OTHER): Payer: Medicare Other | Admitting: Cardiology

## 2012-12-15 VITALS — BP 134/80 | HR 69 | Ht 62.5 in | Wt 150.0 lb

## 2012-12-15 DIAGNOSIS — R600 Localized edema: Secondary | ICD-10-CM

## 2012-12-15 DIAGNOSIS — I872 Venous insufficiency (chronic) (peripheral): Secondary | ICD-10-CM

## 2012-12-15 DIAGNOSIS — E7800 Pure hypercholesterolemia, unspecified: Secondary | ICD-10-CM

## 2012-12-15 DIAGNOSIS — I2510 Atherosclerotic heart disease of native coronary artery without angina pectoris: Secondary | ICD-10-CM

## 2012-12-15 DIAGNOSIS — R609 Edema, unspecified: Secondary | ICD-10-CM

## 2012-12-15 NOTE — Patient Instructions (Addendum)
Your physician recommends that you schedule a follow-up appointment in: 3-4 WEEKs  Your physician recommends that you continue on your current medications as directed. Please refer to the Current Medication list given to you today.

## 2012-12-19 ENCOUNTER — Ambulatory Visit: Payer: Medicare Other | Admitting: Physical Therapy

## 2012-12-19 NOTE — Progress Notes (Signed)
HPI:  This nice patient is seen in followup. She has an unusual neurologic problem, characterized predominantly by lower extremity Parkinson's. Patient's had prior reflux surgery. She's had some discomfort along the left rib margin, and under the left breast which radiates to the back. Despite her limitations, she is remained relatively active, and her symptoms are not clearly related to exertion. She denies diaphoresis or shortness of breath. When she does have this, it can last for some time and seems to resolve  with aspirin. This does not seem to hurt be like reflux as she knows the feeling. There is no radiation down the left arm.  Still has some mild lower extremity edema.    Current Outpatient Prescriptions  Medication Sig Dispense Refill  . albuterol (PROAIR HFA) 108 (90 BASE) MCG/ACT inhaler Inhale 2 puffs into the lungs every 6 (six) hours as needed.        . beclomethasone (QVAR) 80 MCG/ACT inhaler Inhale 1 puff into the lungs as needed.        . Calcium Carbonate-Vitamin D (CALCIUM-VITAMIN D) 500-200 MG-UNIT per tablet Take 1 tablet by mouth daily.        . clonazePAM (KLONOPIN) 0.5 MG tablet Take 1 to 2 tablets a day.  60 tablet  5  . cyanocobalamin (,VITAMIN B-12,) 1000 MCG/ML injection Inject 1,000 mcg into the muscle every 30 (thirty) days.        . famotidine (PEPCID) 20 MG tablet Take 1 tablet (20 mg total) by mouth 2 (two) times daily.  60 tablet  6  . furosemide (LASIX) 20 MG tablet Take 20 mg by mouth daily. For severe swelling with discomfort.      . isosorbide mononitrate (IMDUR) 30 MG 24 hr tablet TAKE 1/2 TABLET BY MOUTH DAILY  30 tablet  6  . loratadine (CLARITIN) 10 MG tablet Take 10 mg by mouth 2 (two) times daily.      . mometasone (NASONEX) 50 MCG/ACT nasal spray 2 sprays by Nasal route as needed.       . nitroGLYCERIN (NITROSTAT) 0.4 MG SL tablet Place 0.4 mg under the tongue every 5 (five) minutes as needed.        . propranolol (INDERAL) 10 MG tablet TAKE 1 TABLET  BY MOUTH THREE TIMES A DAY  180 tablet  1  . rosuvastatin (CRESTOR) 20 MG tablet Take 0.5 tablets (10 mg total) by mouth daily.  1 tablet  0  . sertraline (ZOLOFT) 25 MG tablet TAKE 1 TABLET BY MOUTH EVERY MORNING  30 tablet  11  . SYNTHROID 100 MCG tablet TAKE 1 TABLET BY MOUTH DAILY.  30 tablet  6  . trimethoprim (TRIMPEX) 100 MG tablet Take 100 mg by mouth daily.         No current facility-administered medications for this visit.    No Known Allergies  Past Medical History  Diagnosis Date  . GERD (gastroesophageal reflux disease)   . Familial tremor     followed by Dr. Erling Cruz  . Unspecified chronic bronchitis   . Vaginitis   . Irritable bladder   . Pneumonia   . Hypothyroidism   . Depression   . Asthma   . CAD (coronary artery disease)     myoview 5/08:  EF 76%, no scar, no ischemia, +ECG changes with exercise;  cath 04/08/07:  pLAD 30%, mLAD 60-70% - med tx.  Marland Kitchen HLD (hyperlipidemia)     Past Surgical History  Procedure Laterality Date  . Laparoscopic nissen  fundoplication    . Cystectomy    . Tumor removal    . Tubal ligation      Family History  Problem Relation Age of Onset  . Coronary artery disease    . Heart attack    . Hypertension    . Diabetes    . Breast cancer    . Hypertension Mother   . Heart disease Father     History   Social History  . Marital Status: Married    Spouse Name: N/A    Number of Children: N/A  . Years of Education: N/A   Occupational History  . Not on file.   Social History Main Topics  . Smoking status: Never Smoker   . Smokeless tobacco: Never Used  . Alcohol Use: No  . Drug Use: No  . Sexually Active: Not Currently   Other Topics Concern  . Not on file   Social History Narrative   HSG, retired from [employer] . married '60. 1 son - '65; 1 daughter - '61; 2 grandchildren; 1 great-grandchild. Homemaker. marriage in good health. 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 Positive history of passive tobacco smoke exposure.     ROS: Please see the HPI.  All other systems reviewed and negative.  PHYSICAL EXAM:  BP 134/80  Pulse 69  Ht 5' 2.5" (1.588 m)  Wt 150 lb (68.04 kg)  BMI 26.98 kg/m2  SpO2 99%  General: Well developed, well nourished, in no acute distress.  Tic as previously noted.  Head:  Normocephalic and atraumatic. Neck: no JVD Lungs: Clear to auscultation and percussion. Heart: Normal S1 and S2.  No murmur, rubs or gallops.  Pulses: Pulses normal in all 4 extremities. Extremities: No clubbing or cyanosis. No edema. Neurologic: Alert and oriented x 3.  EKG:  NSR. WNL.  ECHO Study Conclusions  Left ventricle: The cavity size was normal. Wall thickness was normal. The estimated ejection fraction was 60%. Wall motion was normal; there were no regional wall motion abnormalities. Findings consistent with left ventricular diastolic dysfunction.   Lexiscan 2012  Raw Data Images: Normal; no motion artifact; normal heart/lung ratio.  Stress Images: Normal homogeneous uptake in all areas of the myocardium.  Rest Images: Normal homogeneous uptake in all areas of the myocardium.  Subtraction (SDS): Normal  Transient Ischemic Dilatation (Normal <1.22): 1.16  Lung/Heart Ratio (Normal <0.45): 0.39  Quantitative Gated Spect Images  QGS EDV: 41 ml  QGS ESV: 6 ml  QGS cine images: NL LV Function; NL Wall Motion  QGS EF: 85%  Impression  Exercise Capacity: Lexiscan with no exercise.  BP Response: Normal blood pressure response.  Clinical Symptoms: No chest pain.  ECG Impression: No significant ST segment change suggestive of ischemia.  Comparison with Prior Nuclear Study: No images to compare  Overall Impression: Normal stress nuclear study. No evidence of ischemia. Normal LV function.  Thayer Headings, [name] [name]., MD, [facility]  This study was reviewed with the patient during her office visit. TS    ASSESSMENT AND PLAN:

## 2012-12-20 ENCOUNTER — Ambulatory Visit: Payer: Medicare Other | Admitting: Physical Therapy

## 2012-12-22 ENCOUNTER — Ambulatory Visit: Payer: Medicare Other | Admitting: Physical Therapy

## 2012-12-22 NOTE — Assessment & Plan Note (Signed)
Has mild LE edema noted.  EF is normal.  Neck veins are flat.  See other notes.  Continue to watch and treat symptomatically.

## 2012-12-22 NOTE — Assessment & Plan Note (Signed)
Has mid LAD disease treated medically.  Remains on medications.

## 2012-12-22 NOTE — Assessment & Plan Note (Signed)
See other note, and see echo and exam.

## 2012-12-22 NOTE — Assessment & Plan Note (Signed)
Her symptoms due not sound cardiac in etiology.  Will continue to monitor, but would not favor recath.  Nuc study done in fall of 2012 did not demonstrate significant ischemia, and symptoms sound more MS in etiology.

## 2012-12-26 ENCOUNTER — Ambulatory Visit: Payer: Medicare Other | Admitting: Physical Therapy

## 2012-12-27 ENCOUNTER — Ambulatory Visit: Payer: Medicare Other | Admitting: Physical Therapy

## 2013-01-02 ENCOUNTER — Ambulatory Visit: Payer: Medicare Other | Admitting: Occupational Therapy

## 2013-01-02 ENCOUNTER — Encounter: Payer: Medicare Other | Admitting: Occupational Therapy

## 2013-01-02 ENCOUNTER — Ambulatory Visit: Payer: Medicare Other | Admitting: Physical Therapy

## 2013-01-03 ENCOUNTER — Ambulatory Visit: Payer: Medicare Other | Admitting: Occupational Therapy

## 2013-01-03 ENCOUNTER — Ambulatory Visit: Payer: Medicare Other | Admitting: Physical Therapy

## 2013-01-10 ENCOUNTER — Ambulatory Visit: Payer: Medicare Other | Admitting: Occupational Therapy

## 2013-01-10 ENCOUNTER — Ambulatory Visit: Payer: Medicare Other | Admitting: Physical Therapy

## 2013-01-11 ENCOUNTER — Ambulatory Visit: Payer: Medicare Other | Admitting: Cardiology

## 2013-01-12 ENCOUNTER — Ambulatory Visit: Payer: Medicare Other | Attending: Neurology | Admitting: Occupational Therapy

## 2013-01-12 DIAGNOSIS — R269 Unspecified abnormalities of gait and mobility: Secondary | ICD-10-CM | POA: Insufficient documentation

## 2013-01-12 DIAGNOSIS — M6281 Muscle weakness (generalized): Secondary | ICD-10-CM | POA: Insufficient documentation

## 2013-01-12 DIAGNOSIS — IMO0001 Reserved for inherently not codable concepts without codable children: Secondary | ICD-10-CM | POA: Insufficient documentation

## 2013-01-17 ENCOUNTER — Ambulatory Visit: Payer: Medicare Other | Admitting: Occupational Therapy

## 2013-01-17 ENCOUNTER — Ambulatory Visit: Payer: Medicare Other | Admitting: Physical Therapy

## 2013-01-19 ENCOUNTER — Other Ambulatory Visit: Payer: Self-pay | Admitting: *Deleted

## 2013-01-19 ENCOUNTER — Ambulatory Visit: Payer: Medicare Other | Admitting: Occupational Therapy

## 2013-01-19 MED ORDER — ISOSORBIDE MONONITRATE ER 30 MG PO TB24: ORAL_TABLET | ORAL | Status: DC

## 2013-01-23 ENCOUNTER — Ambulatory Visit: Payer: Medicare Other | Admitting: Physical Therapy

## 2013-01-24 ENCOUNTER — Ambulatory Visit: Payer: Medicare Other | Admitting: Physical Therapy

## 2013-01-24 ENCOUNTER — Other Ambulatory Visit: Payer: Self-pay | Admitting: *Deleted

## 2013-01-24 ENCOUNTER — Ambulatory Visit: Payer: Medicare Other | Admitting: Occupational Therapy

## 2013-01-25 ENCOUNTER — Telehealth: Payer: Self-pay | Admitting: Internal Medicine

## 2013-01-25 ENCOUNTER — Other Ambulatory Visit: Payer: Self-pay | Admitting: Internal Medicine

## 2013-01-25 NOTE — Telephone Encounter (Signed)
Caller: Nickola/Patient; Phone: [phone]; Reason for Call: Patient states she was advised by [facility] Rx refill was sent to the office today 01/25/13 and would not be filled until Monday 01/30/13.  Patient states she only has 2 more day worth.  Of medication.  PLEASE F/U WITH PT WITH QUESTIONS.  THANK YOU.

## 2013-01-26 ENCOUNTER — Ambulatory Visit: Payer: Medicare Other | Admitting: Occupational Therapy

## 2013-01-26 NOTE — Telephone Encounter (Signed)
I called and let pt know I verified with the pharmacist and her script is ready to be picked up. She expressed understanding and had no questions or concerns.

## 2013-01-30 ENCOUNTER — Ambulatory Visit: Payer: Medicare Other | Admitting: Physical Therapy

## 2013-02-02 ENCOUNTER — Ambulatory Visit: Payer: Medicare Other | Admitting: Physical Therapy

## 2013-02-06 ENCOUNTER — Ambulatory Visit: Payer: Medicare Other | Admitting: Physical Therapy

## 2013-02-07 ENCOUNTER — Ambulatory Visit: Payer: Medicare Other | Admitting: Cardiology

## 2013-02-09 ENCOUNTER — Ambulatory Visit: Payer: Medicare Other | Attending: Neurology | Admitting: Physical Therapy

## 2013-02-09 DIAGNOSIS — M6281 Muscle weakness (generalized): Secondary | ICD-10-CM | POA: Insufficient documentation

## 2013-02-09 DIAGNOSIS — IMO0001 Reserved for inherently not codable concepts without codable children: Secondary | ICD-10-CM | POA: Insufficient documentation

## 2013-02-09 DIAGNOSIS — R269 Unspecified abnormalities of gait and mobility: Secondary | ICD-10-CM | POA: Insufficient documentation

## 2013-02-14 ENCOUNTER — Ambulatory Visit: Payer: Medicare Other | Admitting: Physical Therapy

## 2013-02-14 ENCOUNTER — Ambulatory Visit: Payer: Medicare Other | Admitting: Occupational Therapy

## 2013-02-15 ENCOUNTER — Other Ambulatory Visit: Payer: Self-pay | Admitting: Internal Medicine

## 2013-02-16 ENCOUNTER — Ambulatory Visit: Payer: Medicare Other | Admitting: Occupational Therapy

## 2013-02-16 ENCOUNTER — Ambulatory Visit: Payer: Medicare Other | Admitting: Physical Therapy

## 2013-02-17 ENCOUNTER — Telehealth: Payer: Self-pay | Admitting: *Deleted

## 2013-02-17 NOTE — Telephone Encounter (Signed)
Left msg on traige needing to get copy of several to take to [facility] clinic. Called pt back gave her medical records # so she can get her medical records...Johny Chess

## 2013-02-21 ENCOUNTER — Ambulatory Visit: Payer: Medicare Other | Admitting: Occupational Therapy

## 2013-02-21 ENCOUNTER — Ambulatory Visit: Payer: Medicare Other | Admitting: Physical Therapy

## 2013-02-22 ENCOUNTER — Encounter: Payer: Self-pay | Admitting: Cardiology

## 2013-02-22 ENCOUNTER — Ambulatory Visit (INDEPENDENT_AMBULATORY_CARE_PROVIDER_SITE_OTHER): Payer: Medicare Other | Admitting: Cardiology

## 2013-02-22 ENCOUNTER — Other Ambulatory Visit: Payer: Self-pay | Admitting: Internal Medicine

## 2013-02-22 VITALS — BP 128/76 | HR 68 | Ht 62.5 in | Wt 147.0 lb

## 2013-02-22 DIAGNOSIS — R600 Localized edema: Secondary | ICD-10-CM

## 2013-02-22 DIAGNOSIS — E7800 Pure hypercholesterolemia, unspecified: Secondary | ICD-10-CM

## 2013-02-22 DIAGNOSIS — R609 Edema, unspecified: Secondary | ICD-10-CM

## 2013-02-22 DIAGNOSIS — I2510 Atherosclerotic heart disease of native coronary artery without angina pectoris: Secondary | ICD-10-CM

## 2013-02-22 NOTE — Progress Notes (Signed)
HPI:  Patient is in for followup today. She's not having any specific cardiac symptoms. She's getting ready to return to the [facility] for reevaluation of her neurologic condition. She denies any chest pain.  We discussed in detail her clinical treatment.  Current Outpatient Prescriptions  Medication Sig Dispense Refill  . albuterol (PROAIR HFA) 108 (90 BASE) MCG/ACT inhaler Inhale 2 puffs into the lungs every 6 (six) hours as needed.        . beclomethasone (QVAR) 80 MCG/ACT inhaler Inhale 1 puff into the lungs as needed.        . Calcium Carbonate-Vitamin D (CALCIUM-VITAMIN D) 500-200 MG-UNIT per tablet Take 1 tablet by mouth daily.        . clonazePAM (KLONOPIN) 0.5 MG tablet TAKE 1-2 TABLETS BY MOUTH DAILY  60 tablet  4  . cyanocobalamin (,VITAMIN B-12,) 1000 MCG/ML injection Inject 1,000 mcg into the muscle every 30 (thirty) days.        . famotidine (PEPCID) 20 MG tablet Take 1 tablet (20 mg total) by mouth 2 (two) times daily.  60 tablet  6  . furosemide (LASIX) 20 MG tablet Take 20 mg by mouth daily. For severe swelling with discomfort.      . isosorbide mononitrate (IMDUR) 30 MG 24 hr tablet TAKE 1/2 TABLET BY MOUTH DAILY  30 tablet  6  . loratadine (CLARITIN) 10 MG tablet Take 10 mg by mouth 2 (two) times daily.      . mometasone (NASONEX) 50 MCG/ACT nasal spray 2 sprays by Nasal route as needed.       . nitroGLYCERIN (NITROSTAT) 0.4 MG SL tablet Place 0.4 mg under the tongue every 5 (five) minutes as needed.        . propranolol (INDERAL) 10 MG tablet TAKE 1 TABLET BY MOUTH THREE TIMES A DAY  180 tablet  1  . rosuvastatin (CRESTOR) 20 MG tablet Take 0.5 tablets (10 mg total) by mouth daily.  1 tablet  0  . sertraline (ZOLOFT) 25 MG tablet TAKE 1 TABLET BY MOUTH EVERY MORNING  30 tablet  11  . SYNTHROID 100 MCG tablet TAKE 1 TABLET BY MOUTH DAILY.  30 tablet  6  . trimethoprim (TRIMPEX) 100 MG tablet Take 100 mg by mouth daily.         No current facility-administered medications  for this visit.    No Known Allergies  Past Medical History  Diagnosis Date  . GERD (gastroesophageal reflux disease)   . Familial tremor     followed by Dr. Erling Cruz  . Unspecified chronic bronchitis   . Vaginitis   . Irritable bladder   . Pneumonia   . Hypothyroidism   . Depression   . Asthma   . CAD (coronary artery disease)     myoview 5/08:  EF 76%, no scar, no ischemia, +ECG changes with exercise;  cath 04/08/07:  pLAD 30%, mLAD 60-70% - med tx.  Marland Kitchen HLD (hyperlipidemia)     Past Surgical History  Procedure Laterality Date  . Laparoscopic nissen fundoplication    . Cystectomy    . Tumor removal    . Tubal ligation      Family History  Problem Relation Age of Onset  . Coronary artery disease    . Heart attack    . Hypertension    . Diabetes    . Breast cancer    . Hypertension Mother   . Heart disease Father     History  Social History  . Marital Status: Married    Spouse Name: N/A    Number of Children: N/A  . Years of Education: N/A   Occupational History  . Not on file.   Social History Main Topics  . Smoking status: Never Smoker   . Smokeless tobacco: Never Used  . Alcohol Use: No  . Drug Use: No  . Sexually Active: Not Currently   Other Topics Concern  . Not on file   Social History Narrative   HSG, retired from [employer] . married '60. 1 son - '65; 1 daughter - '61; 2 grandchildren; 1 great-grandchild. Homemaker. marriage in good health. 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 Positive history of passive tobacco smoke exposure.    ROS: Please see the HPI.  All other systems reviewed and negative.  PHYSICAL EXAM:  BP 128/76  Pulse 68  Ht 5' 2.5" (1.588 m)  Wt 147 lb (66.679 kg)  BMI 26.44 kg/m2  SpO2 96%  General: Well developed, well nourished, in no acute distress. Head:  Normocephalic and atraumatic. Neck: no JVD Lungs: Clear to auscultation and percussion. Heart: Normal S1 and S2.  No murmur, rubs or gallops.  Pulses: Pulses normal in  all 4 extremities. Extremities: No clubbing or cyanosis. Trace edema of the lower extremities which has continued.   Neurologic: Alert and oriented x 3.  EKG:  NSR.  Voltage for LVH, normal variant.  No acute changes.   ECHO 2013   Study Conclusions  - Left ventricle: The cavity size was normal. Wall thickness was normal. Systolic function was normal. The estimated ejection fraction was in the range of 55% to 60%. Wall motion was normal; there were no regional wall motion abnormalities. Doppler parameters are consistent with abnormal left ventricular relaxation (grade 1 diastolic dysfunction). - Aortic valve: There was very mild stenosis. Valve area: 2.01cm^2(VTI). Valve area: 2.01cm^2 (Vmax). - Mitral valve: Mild regurgitation. - Left atrium: The atrium was mildly dilated.  VENOUS DOPPLERS  09/2012  No evidence of DVT.      ASSESSMENT AND PLAN:  1.  CAD treated medically 2.  Mild bilateral edema  -- no DVT, echo with diastolic changes only.  3.  Hyperlipidemia  -  Remains on low dose statins.,

## 2013-02-22 NOTE — Patient Instructions (Addendum)
NO CHANGES WERE MADE TODAY  YOU HAVE A FOLLOW UP WITH DR. Burt Knack 05/10/13 @ 1:45 PM

## 2013-02-23 ENCOUNTER — Ambulatory Visit: Payer: Medicare Other | Admitting: Occupational Therapy

## 2013-02-25 NOTE — Assessment & Plan Note (Signed)
She remains stable on medical treatment.  Guidelines reviewed in detail with patient and family.

## 2013-02-25 NOTE — Assessment & Plan Note (Signed)
Last lipid was ten months ago.  This can be repeated when she sees Dr. Burt Knack.

## 2013-02-25 NOTE — Assessment & Plan Note (Signed)
Remains on low dose furosemide.  Needs BMET.

## 2013-03-02 ENCOUNTER — Encounter: Payer: Self-pay | Admitting: Gastroenterology

## 2013-03-02 ENCOUNTER — Telehealth: Payer: Self-pay

## 2013-03-02 NOTE — Telephone Encounter (Signed)
[name] [name]- She is on furosemide and has not had a bmet. Please have her come in. Thanks. TS  Left message on machine for pt to contact the office.

## 2013-03-02 NOTE — Telephone Encounter (Signed)
The pt is scheduled to travel to [facility] on 03/09/13.  The pt said they are planning on doing extensive testing and blood work will be included. The pt will obtain a copy of her records and forward them to our office after her evaluation.

## 2013-03-13 ENCOUNTER — Encounter: Payer: Self-pay | Admitting: *Deleted

## 2013-03-13 ENCOUNTER — Telehealth: Payer: Self-pay | Admitting: Cardiology

## 2013-03-13 ENCOUNTER — Encounter: Payer: Self-pay | Admitting: Neurology

## 2013-03-13 DIAGNOSIS — I2510 Atherosclerotic heart disease of native coronary artery without angina pectoris: Secondary | ICD-10-CM

## 2013-03-13 NOTE — Telephone Encounter (Signed)
I spoke with the pt and she was diagnosed with Multiple System Atrophy (MSA) by the [facility].  The pt did not have any labs drawn while at the [facility].  She will come into our office for a BMP on 03/22/13.

## 2013-03-13 NOTE — Telephone Encounter (Signed)
New problem     [facility].

## 2013-03-13 NOTE — Telephone Encounter (Signed)
Left message to call back  

## 2013-03-20 ENCOUNTER — Ambulatory Visit (INDEPENDENT_AMBULATORY_CARE_PROVIDER_SITE_OTHER): Payer: Medicare Other | Admitting: *Deleted

## 2013-03-20 ENCOUNTER — Ambulatory Visit: Payer: Medicare Other | Admitting: Occupational Therapy

## 2013-03-20 ENCOUNTER — Ambulatory Visit: Payer: Medicare Other | Attending: Neurology | Admitting: Physical Therapy

## 2013-03-20 DIAGNOSIS — IMO0001 Reserved for inherently not codable concepts without codable children: Secondary | ICD-10-CM | POA: Insufficient documentation

## 2013-03-20 DIAGNOSIS — R269 Unspecified abnormalities of gait and mobility: Secondary | ICD-10-CM | POA: Insufficient documentation

## 2013-03-20 DIAGNOSIS — M6281 Muscle weakness (generalized): Secondary | ICD-10-CM | POA: Insufficient documentation

## 2013-03-20 DIAGNOSIS — E538 Deficiency of other specified B group vitamins: Secondary | ICD-10-CM

## 2013-03-20 MED ORDER — CYANOCOBALAMIN 1000 MCG/ML IJ SOLN
1000.0000 ug | Freq: Once | INTRAMUSCULAR | Status: AC
  Administered 2013-03-20: 1000 ug via INTRAMUSCULAR

## 2013-03-20 NOTE — Patient Instructions (Addendum)
Patient reports that she does well with taking B12. I will advise MD of work up at [facility]. Next appointment in one month. Cherene Julian BS RN

## 2013-03-20 NOTE — Progress Notes (Signed)
Patient here for B12 injection.  Under aseptic technique cyanocobalamin 1084mg/1ml given IM in left gluteus maximus.  Tolerated well. [facility] stat [facility] be [facility] soon. She states that she was diagnosed with a muscular atrophy.

## 2013-03-22 ENCOUNTER — Other Ambulatory Visit (INDEPENDENT_AMBULATORY_CARE_PROVIDER_SITE_OTHER): Payer: Medicare Other

## 2013-03-22 DIAGNOSIS — E7800 Pure hypercholesterolemia, unspecified: Secondary | ICD-10-CM

## 2013-03-22 DIAGNOSIS — I2510 Atherosclerotic heart disease of native coronary artery without angina pectoris: Secondary | ICD-10-CM

## 2013-03-23 ENCOUNTER — Ambulatory Visit: Payer: Medicare Other | Admitting: Physical Therapy

## 2013-03-23 ENCOUNTER — Telehealth: Payer: Self-pay | Admitting: Cardiovascular Disease

## 2013-03-23 ENCOUNTER — Ambulatory Visit: Payer: Medicare Other | Admitting: Occupational Therapy

## 2013-03-23 LAB — BASIC METABOLIC PANEL
BUN: 18 mg/dL (ref 6–23)
GFR: 102.22 mL/min (ref >60.00)
Potassium: 5.1 mEq/L (ref 3.5–5.1)
Sodium: 140 mEq/L (ref 135–145)

## 2013-03-23 LAB — HEPATIC FUNCTION PANEL
AST: 20 U/L (ref 0–37)
Alkaline Phosphatase: 112 U/L (ref 39–117)
Bilirubin, Direct: 0.1 mg/dL (ref 0.0–0.3)
Total Bilirubin: 0.4 mg/dL (ref 0.3–1.2)

## 2013-03-23 NOTE — Telephone Encounter (Signed)
Returned call to patient she stated she had blood work 03/22/13  forgot and left bandage on arm all afternoon.Stated arm red and itching.Advised she left bandage on too long.Advised to keep area clean, can use lotion around bandage site if needed. Advised to call back if not better.

## 2013-03-23 NOTE — Telephone Encounter (Signed)
New Problem:    Patient called in concerned because the site where she had blood drawn is red puffy and itchy.  Please call back.

## 2013-03-27 ENCOUNTER — Ambulatory Visit: Payer: Medicare Other | Admitting: Occupational Therapy

## 2013-03-27 ENCOUNTER — Ambulatory Visit: Payer: Medicare Other | Admitting: Physical Therapy

## 2013-03-30 ENCOUNTER — Ambulatory Visit: Payer: Medicare Other | Admitting: Occupational Therapy

## 2013-03-30 ENCOUNTER — Ambulatory Visit: Payer: Medicare Other | Admitting: Physical Therapy

## 2013-04-04 ENCOUNTER — Ambulatory Visit: Payer: Medicare Other | Admitting: Physical Therapy

## 2013-04-04 ENCOUNTER — Ambulatory Visit: Payer: Medicare Other | Admitting: Occupational Therapy

## 2013-04-06 ENCOUNTER — Ambulatory Visit: Payer: Medicare Other | Admitting: Occupational Therapy

## 2013-04-06 ENCOUNTER — Ambulatory Visit: Payer: Medicare Other | Admitting: Physical Therapy

## 2013-04-10 ENCOUNTER — Ambulatory Visit: Payer: Medicare Other | Admitting: Occupational Therapy

## 2013-04-10 ENCOUNTER — Ambulatory Visit: Payer: Medicare Other | Attending: Neurology | Admitting: Physical Therapy

## 2013-04-10 DIAGNOSIS — M6281 Muscle weakness (generalized): Secondary | ICD-10-CM | POA: Insufficient documentation

## 2013-04-10 DIAGNOSIS — IMO0001 Reserved for inherently not codable concepts without codable children: Secondary | ICD-10-CM | POA: Insufficient documentation

## 2013-04-10 DIAGNOSIS — R269 Unspecified abnormalities of gait and mobility: Secondary | ICD-10-CM | POA: Insufficient documentation

## 2013-04-13 ENCOUNTER — Ambulatory Visit: Payer: Medicare Other | Admitting: Occupational Therapy

## 2013-04-13 ENCOUNTER — Ambulatory Visit: Payer: Medicare Other | Admitting: Physical Therapy

## 2013-04-18 ENCOUNTER — Ambulatory Visit: Payer: Medicare Other | Admitting: Physical Therapy

## 2013-04-20 ENCOUNTER — Encounter: Payer: Medicare Other | Admitting: Occupational Therapy

## 2013-04-20 ENCOUNTER — Other Ambulatory Visit: Payer: Self-pay

## 2013-04-20 ENCOUNTER — Ambulatory Visit: Payer: Medicare Other | Admitting: Occupational Therapy

## 2013-04-20 ENCOUNTER — Ambulatory Visit: Payer: Medicare Other | Admitting: Physical Therapy

## 2013-04-20 DIAGNOSIS — E7800 Pure hypercholesterolemia, unspecified: Secondary | ICD-10-CM

## 2013-04-20 MED ORDER — ROSUVASTATIN CALCIUM 20 MG PO TABS: 10.0000 mg | ORAL_TABLET | Freq: Every day | ORAL | Status: DC

## 2013-04-24 ENCOUNTER — Encounter: Payer: Medicare Other | Admitting: Occupational Therapy

## 2013-04-24 ENCOUNTER — Ambulatory Visit: Payer: Medicare Other | Admitting: Physical Therapy

## 2013-04-25 ENCOUNTER — Ambulatory Visit: Payer: Medicare Other | Admitting: Occupational Therapy

## 2013-04-25 ENCOUNTER — Ambulatory Visit: Payer: Medicare Other | Admitting: Physical Therapy

## 2013-04-27 ENCOUNTER — Ambulatory Visit: Payer: Medicare Other | Admitting: Physical Therapy

## 2013-04-27 ENCOUNTER — Encounter: Payer: Medicare Other | Admitting: Occupational Therapy

## 2013-05-02 ENCOUNTER — Ambulatory Visit: Payer: Medicare Other | Admitting: Occupational Therapy

## 2013-05-02 ENCOUNTER — Ambulatory Visit: Payer: Medicare Other | Admitting: Physical Therapy

## 2013-05-04 ENCOUNTER — Ambulatory Visit: Payer: Medicare Other | Admitting: Physical Therapy

## 2013-05-04 ENCOUNTER — Ambulatory Visit: Payer: Medicare Other | Admitting: Occupational Therapy

## 2013-05-09 ENCOUNTER — Ambulatory Visit: Payer: Medicare Other | Attending: Neurology | Admitting: Physical Therapy

## 2013-05-09 ENCOUNTER — Ambulatory Visit: Payer: Medicare Other | Admitting: Occupational Therapy

## 2013-05-09 DIAGNOSIS — R269 Unspecified abnormalities of gait and mobility: Secondary | ICD-10-CM | POA: Insufficient documentation

## 2013-05-09 DIAGNOSIS — M6281 Muscle weakness (generalized): Secondary | ICD-10-CM | POA: Insufficient documentation

## 2013-05-09 DIAGNOSIS — IMO0001 Reserved for inherently not codable concepts without codable children: Secondary | ICD-10-CM | POA: Insufficient documentation

## 2013-05-10 ENCOUNTER — Encounter: Payer: Self-pay | Admitting: Cardiovascular Disease

## 2013-05-10 ENCOUNTER — Encounter: Payer: Self-pay | Admitting: Neurology

## 2013-05-10 ENCOUNTER — Ambulatory Visit (INDEPENDENT_AMBULATORY_CARE_PROVIDER_SITE_OTHER): Payer: Medicare Other | Admitting: Cardiovascular Disease

## 2013-05-10 ENCOUNTER — Ambulatory Visit (INDEPENDENT_AMBULATORY_CARE_PROVIDER_SITE_OTHER): Payer: Medicare Other | Admitting: *Deleted

## 2013-05-10 ENCOUNTER — Ambulatory Visit (INDEPENDENT_AMBULATORY_CARE_PROVIDER_SITE_OTHER): Payer: Medicare Other | Admitting: Neurology

## 2013-05-10 VITALS — BP 124/56 | HR 79 | Ht 62.0 in | Wt 140.0 lb

## 2013-05-10 VITALS — BP 122/71 | HR 66 | Ht 63.0 in | Wt 147.0 lb

## 2013-05-10 DIAGNOSIS — R296 Repeated falls: Secondary | ICD-10-CM

## 2013-05-10 DIAGNOSIS — G20 Parkinson's disease: Secondary | ICD-10-CM

## 2013-05-10 DIAGNOSIS — R269 Unspecified abnormalities of gait and mobility: Secondary | ICD-10-CM

## 2013-05-10 DIAGNOSIS — E538 Deficiency of other specified B group vitamins: Secondary | ICD-10-CM

## 2013-05-10 DIAGNOSIS — Z9181 History of falling: Secondary | ICD-10-CM

## 2013-05-10 DIAGNOSIS — I2510 Atherosclerotic heart disease of native coronary artery without angina pectoris: Secondary | ICD-10-CM

## 2013-05-10 MED ORDER — CYANOCOBALAMIN 1000 MCG/ML IJ SOLN
1000.0000 ug | INTRAMUSCULAR | Status: AC
  Administered 2013-05-10 – 2013-09-01 (×2): 1000 ug via INTRAMUSCULAR

## 2013-05-10 NOTE — Patient Instructions (Signed)
Your physician wants you to follow-up in: 6 MONTHS with Dr Burt Knack.  You will receive a reminder letter in the mail two months in advance. If you don't receive a letter, please call our office to schedule the follow-up appointment.  Your physician recommends that you continue on your current medications as directed. Please refer to the Current Medication list given to you today.

## 2013-05-10 NOTE — Progress Notes (Signed)
Pt here for RV with Dr. Rexene Alberts.  I gave pt her B 12 injection (cyanocobalamin 103mg/ 19m  to L deltoid under aseptic technique.  Tolerated well.  Bandaid applied.

## 2013-05-10 NOTE — Patient Instructions (Addendum)
Pt to return in 1 month.  Will schedule at check out.

## 2013-05-10 NOTE — Progress Notes (Signed)
Subjective:    Patient ID: [name] [name] is a 72 y.o. female.  HPI  Interim history:   [name] [name] is a very pleasant 72 year old right-handed woman who presents for followup consultation of her gait disorder, essential tremor, and vertigo, recurrent falls and concern for MSA. She is accompanied by her husband today. This is her first visit with me and she previously followed with Dr. Morene Antu and was last seen by him on 12/30/2012, at which time he felt that she had worsening of her gait. She also had symptoms of vertigo. She has an underlying medical history of thyroid disease, [place] spotted fever at age 33, asthma, cardiac disease, essential tremor, hearing loss, hyperlipidemia, reflux disease with surgical repair. She's currently on baby aspirin, Synthroid, Crestor, and/or, clonazepam, propranolol, sertraline, trimethoprim, Nasonex, Provera, nitroglycerin as needed, vitamin B12.  I reviewed Dr. Tressia Danas prior notes and the patient's records and below is a summary of the review:  72 year old right-handed woman who was diagnosed about 20 years ago with essential tremor. She had head tremor more than upper extremity tremor. She has a history of lung disease but was able to take low-dose propranolol. She has not tried primidone. She has had gait and balance problems in the last 5 years approximately. She has to hold onto something. She has had bladder incontinence since her 64s. She was on Crestor for 3 years which was discontinued in June 2011. CK was normal and EMG nerve conduction studies were normal in June 2011. Crestor was restarted. She has been on B12 injections without subjective improvement. MRI brain with and without contrast showed hyperostosis frontalis, MRI C-spine showed mild degenerative joint disease. 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 Lumbar spine MRI from July 2012 showed mild degenerative disc disease. 4 gait  dysfunction she was referred to Dr. Gilford Rile with a question of myelopathy versus orthostatic tremor versus lower half parkinsonism versus essential tremor with gait disorder. 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 Further testing revealed negative tilt table test but there was mild autonomic neuropathy per autonomic reflex screen. She has had swelling in both legs with negative Doppler of the legs and normal echocardiogram. Additional workup at [facility] included vitamin D, paraneoplastic antibodies, anti-GAD, all negative.  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 She fell some 2 weeks ago while visiting her mother in the [place]. The fall before that was in the kitchen at home. She uses her 2 wheeled walker, and has been using it for about 18 months.  She has been in PT and OT and is about to complete it. She has had improvement with therapy. She does endorse a lot of stress, due primarily to her mother's and daughter's health. Mother is 61 and in a NH. Her daughter has advanced MS. She sleeps in a recliner, d/t pain in her trunk, back, neck and stiffness and difficulty getting in and out of bed.   Her Past Medical History Is Significant For: Past Medical History  Diagnosis Date  . GERD (gastroesophageal reflux disease)   . Familial tremor     followed by Dr. Erling Cruz  . Unspecified chronic bronchitis   .  Vaginitis   . Irritable bladder   . Pneumonia   . Hypothyroidism   . Depression   . Asthma   . CAD (coronary artery disease)     myoview 5/08:  EF 76%, no scar, no ischemia, +ECG changes with exercise;  cath 04/08/07:  pLAD 30%, mLAD 60-70% - med tx.  Marland Kitchen HLD (hyperlipidemia)   . 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 Laparoscopic nissen fundoplication    . Cystectomy    . Tumor removal    . Tubal ligation      Her Family History Is Significant For: Family History  Problem Relation Age of Onset  . Coronary artery disease    . Heart attack    . Hypertension    . Diabetes    . Breast cancer    . Hypertension Mother   . Heart disease Father     Her Social History Is Significant For: History   Social History  . Marital Status: Married    Spouse Name: N/A    Number of Children: N/A  . Years of Education: N/A   Social History Main Topics  . Smoking status: Never Smoker   . Smokeless tobacco: Never Used  . Alcohol Use: No  . Drug Use: No  . Sexually Active: Not Currently   Other Topics Concern  . None   Social History Narrative   HSG, retired from [employer] . married '60. 1 son - '65; 1 daughter - '61; 2 grandchildren; 1 great-grandchild. Homemaker. marriage in good health. 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 Positive history of passive tobacco smoke exposure.    Her Allergies Are:  No Known Allergies:   Her Current Medications Are:  Outpatient Encounter Prescriptions as of 05/10/2013  Medication Sig Dispense Refill  . albuterol (PROAIR HFA) 108 (90 BASE) MCG/ACT inhaler Inhale 2 puffs into the lungs every 6 (six) hours as needed.        . beclomethasone (QVAR) 80 MCG/ACT inhaler Inhale 1 puff into the lungs as needed.        . Calcium Carbonate-Vitamin D (CALCIUM-VITAMIN D) 500-200 MG-UNIT per tablet Take 1 tablet by mouth daily.        . clonazePAM (KLONOPIN) 0.5 MG tablet TAKE 1-2 TABLETS BY MOUTH DAILY  60 tablet  4  . cyanocobalamin (,VITAMIN B-12,) 1000 MCG/ML injection Inject 1,000 mcg into the muscle every 30 (thirty) days.        . famotidine (PEPCID) 20 MG tablet Take 1 tablet (20 mg total) by mouth 2 (two) times daily.  60 tablet  6  . furosemide  (LASIX) 20 MG tablet Take 20 mg by mouth daily. For severe swelling with discomfort.      . isosorbide mononitrate (IMDUR) 30 MG 24 hr tablet TAKE 1/2 TABLET BY MOUTH DAILY  30 tablet  6  . mometasone (NASONEX) 50 MCG/ACT nasal spray 2 sprays by Nasal route as needed.       . nitroGLYCERIN (NITROSTAT) 0.4 MG SL tablet Place 0.4 mg under the tongue every 5 (five) minutes as needed.        . propranolol (INDERAL) 10 MG tablet TAKE 1 TABLET BY MOUTH THREE TIMES A DAY  180 tablet  1  . rosuvastatin (CRESTOR) 20 MG tablet Take 0.5 tablets (10 mg total) by mouth daily.  15 tablet  5  .  sertraline (ZOLOFT) 25 MG tablet TAKE 1 TABLET BY MOUTH EVERY MORNING  30 tablet  11  . SYNTHROID 100 MCG tablet TAKE 1 TABLET BY MOUTH DAILY.  30 tablet  6  . trimethoprim (TRIMPEX) 100 MG tablet Take 100 mg by mouth daily.        Marland Kitchen loratadine (CLARITIN) 10 MG tablet Take 10 mg by mouth 2 (two) times daily.       No facility-administered encounter medications on file as of 05/10/2013.   Review of Systems  Constitutional: Positive for chills, fatigue and unexpected weight change.  HENT: Positive for hearing loss and rhinorrhea.   Cardiovascular: Positive for leg swelling.  Gastrointestinal:       Constipation  Genitourinary: Positive for difficulty urinating.       Incontinence  Neurological: Positive for tremors and weakness.  Hematological: Bruises/bleeds easily.  Psychiatric/Behavioral: Positive for sleep disturbance.    Objective:  Neurologic Exam  Physical Exam Physical Examination:   Filed Vitals:   05/10/13 1132  BP: 122/71  Pulse: 66    General Examination: The patient is a very pleasant 72 y.o. female in no acute distress. She appears well-developed and well-nourished and well groomed.   HEENT: Normocephalic, atraumatic, pupils are equal, round and reactive to light and accommodation. Funduscopic exam is normal with sharp disc margins noted. Extraocular tracking is good without limitation to  gaze excursion or nystagmus noted. Saccadic breakdown of smooth pursuit with limitations to upgaze. Face is symmetric with mild facial masking and normal facial sensation. Speech shows mild voice tremor, but no dysarthria noted. There is mild hypophonia. There is a mild head, no-no type tremor. Neck is moderately rigid. There are no carotid bruits on auscultation. Oropharynx exam reveals: mild mouth dryness, adequate dental hygiene and mild airway crowding, due to narrow airway. Mallampati is class II.   Chest: Clear to auscultation without wheezing, rhonchi or crackles noted.  Heart: S1+S2+0, regular and normal without murmurs, rubs or gallops noted.   Abdomen: Soft, non-tender and non-distended with normal bowel sounds appreciated on auscultation.  Extremities: There is 1+ pitting edema in the distal lower extremities bilaterally. She wears compression hose bilaterally.  Skin: Warm and dry without trophic changes noted.   Musculoskeletal: exam reveals no obvious joint deformities, tenderness or joint swelling or erythema.   Neurologically:  Mental status: The patient is awake, alert and oriented in all 4 spheres. Her memory, attention, language and knowledge are fairly well preserved. There is no aphasia, agnosia, apraxia or anomia. Speech is clear with normal prosody and enunciation. Thought process is linear. Mood is congruent and affect is normal.  Cranial nerves are as described above under HEENT exam. In addition, shoulder shrug is normal with equal shoulder height noted. Motor exam: she has thin bulk, 4/5 global strength and tone is increased mildly There is no drift, no resting tremor or rebound. She has a mild action tremor bilaterally. Reflexes are 2+ throughout. Fine motor skills are moderately impaired in the UEs and severely impaired in the LEs.  Cerebellar testing shows no dysmetria or intention tremor on finger to nose testing. There is no truncal or gait ataxia.  Sensory exam is  intact to light touch.  Gait, station and balance: she stands with severe difficulty and needs mild assistance. No veering to one side is noted. No leaning to one side is noted. Posture is moderately stooped and stance is wide based. She uses her walker and walks with her walker with significant difficulty turning. She's  not able to perform tandem walk.    Assessment and Plan:   Assessment and Plan:  In summary, [name] [name] is a very pleasant 72 y.o.-year old female with a history of essential tremor affecting her head and her upper extremities. She has a gait disorder, recurrent falls, as well as signs of parkinsonism. 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 I understand that there is no specific medication and that the course is invariably progressive. She is encouraged to use her walker at all times. She may get to a point where she needs to use a motorized wheelchair. She is advised to drink plenty of fluids. She is advised to continue staying active mentally. She is furthermore advised to followup for routine followup in 4 months from now, sooner if the need arises. They are encouraged to call with any interim questions, concerns or problems. They were in agreement. She will be seen at the [facility] back in one year.

## 2013-05-10 NOTE — Progress Notes (Signed)
HPI:  72 year old woman presenting for followup evaluation. She is followed for coronary artery disease. She's been managed medically over the years. The patient has been followed by Dr. Lia Foyer and I will be assuming her care in his retirement. She has been diagnosed with multiple systems atrophy. Her neurologic symptoms have been progressive over the last 3 years and she anticipates a will continue to worsen. She denies cardiovascular symptoms. She specifically denies chest pain or shortness of breath. She does admit to some leg edema.  Outpatient Encounter Prescriptions as of 05/10/2013  Medication Sig Dispense Refill  . albuterol (PROAIR HFA) 108 (90 BASE) MCG/ACT inhaler Inhale 2 puffs into the lungs every 6 (six) hours as needed.        . beclomethasone (QVAR) 80 MCG/ACT inhaler Inhale 1 puff into the lungs as needed.        . Calcium Carbonate-Vitamin D (CALCIUM-VITAMIN D) 500-200 MG-UNIT per tablet Take 1 tablet by mouth daily.        . clonazePAM (KLONOPIN) 0.5 MG tablet TAKE 1-2 TABLETS BY MOUTH DAILY  60 tablet  4  . cyanocobalamin (,VITAMIN B-12,) 1000 MCG/ML injection Inject 1,000 mcg into the muscle every 30 (thirty) days.        . famotidine (PEPCID) 20 MG tablet Take 1 tablet (20 mg total) by mouth 2 (two) times daily.  60 tablet  6  . furosemide (LASIX) 20 MG tablet Take 20 mg by mouth daily. For severe swelling with discomfort.      . isosorbide mononitrate (IMDUR) 30 MG 24 hr tablet TAKE 1/2 TABLET BY MOUTH DAILY  30 tablet  6  . loratadine (CLARITIN) 10 MG tablet Take 10 mg by mouth 2 (two) times daily.      . mometasone (NASONEX) 50 MCG/ACT nasal spray 2 sprays by Nasal route as needed.       . nitroGLYCERIN (NITROSTAT) 0.4 MG SL tablet Place 0.4 mg under the tongue every 5 (five) minutes as needed.        . propranolol (INDERAL) 10 MG tablet TAKE 1 TABLET BY MOUTH THREE TIMES A DAY  180 tablet  1  . rosuvastatin (CRESTOR) 20 MG tablet Take 0.5 tablets (10 mg total) by mouth  daily.  15 tablet  5  . sertraline (ZOLOFT) 25 MG tablet TAKE 1 TABLET BY MOUTH EVERY MORNING  30 tablet  11  . SYNTHROID 100 MCG tablet TAKE 1 TABLET BY MOUTH DAILY.  30 tablet  6  . trimethoprim (TRIMPEX) 100 MG tablet Take 100 mg by mouth daily.         Facility-Administered Encounter Medications as of 05/10/2013  Medication Dose Route Frequency Provider Last Rate Last Dose  . cyanocobalamin ((VITAMIN B-12)) injection 1,000 mcg  1,000 mcg Intramuscular Q30 days Star Age, MD   1,000 mcg at 05/10/13 1240    No Known Allergies  Past Medical History  Diagnosis Date  . GERD (gastroesophageal reflux disease)   . Familial tremor     followed by Dr. Erling Cruz  . Unspecified chronic bronchitis   . Vaginitis   . Irritable bladder   . Pneumonia   . Hypothyroidism   . Depression   . Asthma   . CAD (coronary artery disease)     myoview 5/08:  EF 76%, no scar, no ischemia, +ECG changes with exercise;  cath 04/08/07:  pLAD 30%, mLAD 60-70% - med tx.  Marland Kitchen HLD (hyperlipidemia)   . 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 Coronary artery disease, moderate nonobstructive, managed medically. The patient is having no anginal symptoms. We'll continue her current medical program. She does not take antiplatelet therapy because she notes she "bleeds easily."  2. Hyperlipidemia. She's managed with low-dose Crestor.  Last lipids one year ago showed cholesterol 106, LDL 37, HDL 54.  3. Peripheral edema. Suspect related to immobility. No other evidence of volume overload.  For followup I will see her back in 6 months.  [name] 05/10/2013 2:29 PM

## 2013-05-10 NOTE — Patient Instructions (Addendum)
I think overall you are doing fairly well but I do want to suggest a few things today:  Remember to drink plenty of fluid, eat healthy meals and do not skip any meals. Try to eat protein with a every meal and eat a healthy snack such as fruit or nuts in between meals. Try to keep a regular sleep-wake schedule and try to exercise daily, particularly in the form of walking, 20-30 minutes a day, if you can.   Engage in social activities in your community and with your family and try to keep up with current events by reading the newspaper or watching the news.   As far as your medications are concerned, I would like to suggest no new medications.    As far as diagnostic testing: no new test needed.   I would like to see you back in 4 months, sooner if we need to. Please call us with any interim questions, concerns, problems, updates or refill requests.  Richardson Landry is my clinical assistant and will answer any of your questions and relay your messages to me and also relay most of my messages to you.  Our phone number is [phone]. We also have an after hours call service for urgent matters and there is a physician on-call for urgent questions. For any emergencies you know to call 911 or go to the nearest emergency room.

## 2013-05-11 ENCOUNTER — Ambulatory Visit: Payer: Medicare Other | Admitting: Occupational Therapy

## 2013-05-11 ENCOUNTER — Ambulatory Visit: Payer: Medicare Other | Admitting: Physical Therapy

## 2013-05-16 ENCOUNTER — Ambulatory Visit: Payer: Medicare Other | Admitting: Occupational Therapy

## 2013-05-16 ENCOUNTER — Ambulatory Visit: Payer: Medicare Other | Admitting: Physical Therapy

## 2013-05-18 ENCOUNTER — Ambulatory Visit: Payer: Medicare Other | Admitting: Occupational Therapy

## 2013-05-18 ENCOUNTER — Ambulatory Visit: Payer: Medicare Other | Admitting: Physical Therapy

## 2013-05-20 ENCOUNTER — Other Ambulatory Visit: Payer: Self-pay | Admitting: Internal Medicine

## 2013-06-09 ENCOUNTER — Telehealth: Payer: Self-pay | Admitting: Neurology

## 2013-06-12 ENCOUNTER — Ambulatory Visit: Payer: Medicare Other

## 2013-06-16 ENCOUNTER — Ambulatory Visit: Payer: Medicare Other

## 2013-06-26 ENCOUNTER — Other Ambulatory Visit: Payer: Self-pay | Admitting: Internal Medicine

## 2013-06-26 NOTE — Telephone Encounter (Signed)
Clonazepam called to pharmacy  

## 2013-07-01 ENCOUNTER — Other Ambulatory Visit: Payer: Self-pay | Admitting: Internal Medicine

## 2013-07-05 ENCOUNTER — Telehealth: Payer: Self-pay | Admitting: *Deleted

## 2013-07-05 DIAGNOSIS — E7800 Pure hypercholesterolemia, unspecified: Secondary | ICD-10-CM

## 2013-07-05 DIAGNOSIS — E039 Hypothyroidism, unspecified: Secondary | ICD-10-CM

## 2013-07-05 DIAGNOSIS — K219 Gastro-esophageal reflux disease without esophagitis: Secondary | ICD-10-CM

## 2013-07-05 NOTE — Telephone Encounter (Signed)
Pt called requesting whether she needs lab work.  Please advise

## 2013-07-05 NOTE — Telephone Encounter (Signed)
Orders placed for H/H, lipid panel and TSH.

## 2013-07-06 NOTE — Telephone Encounter (Signed)
Advised pt of lab orders

## 2013-07-07 ENCOUNTER — Telehealth: Payer: Self-pay | Admitting: Gastroenterology

## 2013-07-07 ENCOUNTER — Other Ambulatory Visit (INDEPENDENT_AMBULATORY_CARE_PROVIDER_SITE_OTHER): Payer: Medicare Other

## 2013-07-07 DIAGNOSIS — E039 Hypothyroidism, unspecified: Secondary | ICD-10-CM

## 2013-07-07 DIAGNOSIS — E7800 Pure hypercholesterolemia, unspecified: Secondary | ICD-10-CM

## 2013-07-07 DIAGNOSIS — K219 Gastro-esophageal reflux disease without esophagitis: Secondary | ICD-10-CM

## 2013-07-07 LAB — LIPID PANEL
Cholesterol: 135 mg/dL (ref 0–200)
LDL Cholesterol: 58 mg/dL (ref 0–99)
Total CHOL/HDL Ratio: 2
VLDL: 13.8 mg/dL (ref 0.0–40.0)

## 2013-07-07 LAB — HEMOGLOBIN AND HEMATOCRIT, BLOOD: HCT: 39 % (ref 36.0–46.0)

## 2013-07-07 NOTE — Telephone Encounter (Signed)
Left message for pt to call back  °

## 2013-07-11 NOTE — Telephone Encounter (Signed)
Pt is due for colon but is concerned about being able to do the prep at home. She uses a walker and has history of tremors and has had several falls. Pt is concerned about being able to do the prep at home. Pt scheduled to see Dr. Deatra Ina for an OV to discuss the colonoscopy. Pt scheduled to see Dr. Deatra Ina 07/28/13@3 :15pm. Pt aware of appt.

## 2013-07-28 ENCOUNTER — Ambulatory Visit: Payer: Medicare Other | Admitting: Gastroenterology

## 2013-07-28 ENCOUNTER — Ambulatory Visit (INDEPENDENT_AMBULATORY_CARE_PROVIDER_SITE_OTHER): Payer: Medicare Other | Admitting: Gastroenterology

## 2013-07-28 ENCOUNTER — Encounter: Payer: Self-pay | Admitting: Gastroenterology

## 2013-07-28 VITALS — BP 100/62 | HR 64 | Ht 62.5 in | Wt 145.0 lb

## 2013-07-28 DIAGNOSIS — R1319 Other dysphagia: Secondary | ICD-10-CM

## 2013-07-28 DIAGNOSIS — R1310 Dysphagia, unspecified: Secondary | ICD-10-CM

## 2013-07-28 DIAGNOSIS — K5900 Constipation, unspecified: Secondary | ICD-10-CM

## 2013-07-28 NOTE — Patient Instructions (Addendum)
You have been scheduled for a Barium Esophogram at [facility] (1st floor of the hospital) on 08/02/2013 at 9:30am. Please arrive 15 minutes prior to your appointment for registration. Make certain not to have anything to eat or drink 6 hours prior to your test. If you need to reschedule for any reason, please contact radiology at [phone] to do so. __________________________________________________________________ A barium swallow is an examination that concentrates on views of the esophagus. This tends to be a double contrast exam (barium and two liquids which, when combined, create a gas to distend the wall of the oesophagus) or single contrast (non-ionic iodine based). The study is usually tailored to your symptoms so a good history is essential. Attention is paid during the study to the form, structure and configuration of the esophagus, looking for functional disorders (such as aspiration, dysphagia, achalasia, motility and reflux) EXAMINATION You may be asked to change into a gown, depending on the type of swallow being performed. A radiologist and radiographer will perform the procedure. The radiologist will advise you of the type of contrast selected for your procedure and direct you during the exam. You will be asked to stand, sit or lie in several different positions and to hold a small amount of fluid in your mouth before being asked to swallow while the imaging is performed .In some instances you may be asked to swallow barium coated marshmallows to assess the motility of a solid food bolus. The exam can be recorded as a digital or video fluoroscopy procedure. POST PROCEDURE It will take 1-2 days for the barium to pass through your system. To facilitate this, it is important, unless otherwise directed, to increase your fluids for the next 24-48hrs and to resume your normal diet.  This test typically takes about 30 minutes to  perform. __________________________________________________________________________________

## 2013-07-28 NOTE — Progress Notes (Signed)
History of Present Illness: Pleasant 72 year old white female referred for evaluation of constipation.  She has a history of diverticulosis and was last examined in 2004.  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  She is unable to ambulate without a walker.  She suffers from constipation.  She may have 2 complete bowel movements in a week and at other times pass small amounts of stool with straining.  She has a history of esophageal stricture and is complaining of intermittent dysphagia to solids.  She denies rectal bleeding or abdominal pain.    Past Medical History  Diagnosis Date  . GERD (gastroesophageal reflux disease)   . Familial tremor     followed by Dr. Erling Cruz  . Unspecified chronic bronchitis   . Vaginitis   . Irritable bladder   . Pneumonia   . Hypothyroidism   . Depression   . Asthma   . CAD (coronary artery disease)     myoview 5/08:  EF 76%, no scar, no ischemia, +ECG changes with exercise;  cath 04/08/07:  pLAD 30%, mLAD 60-70% - med tx.  Marland Kitchen HLD (hyperlipidemia)   . 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 Laparoscopic nissen fundoplication    . Cystectomy    . Tumor removal    . Tubal ligation     family history includes Breast cancer in an other family member; Coronary artery disease in an other family member; Diabetes in an other family member; Heart attack in an other family member; Heart disease in her father; Hypertension in her mother and another family member. Current Outpatient Prescriptions  Medication Sig Dispense Refill  . albuterol (PROAIR HFA) 108 (90 BASE) MCG/ACT inhaler Inhale 2 puffs into the lungs every 6 (six) hours as needed.        . beclomethasone (QVAR) 80 MCG/ACT inhaler Inhale 1 puff into the lungs as needed.        . Calcium Carbonate-Vitamin D (CALCIUM-VITAMIN D) 500-200 MG-UNIT per tablet Take 1 tablet  by mouth daily.        . clonazePAM (KLONOPIN) 0.5 MG tablet TAKE 1-2 TABLETS BY MOUTH DAILY  60 tablet  4  . cyanocobalamin (,VITAMIN B-12,) 1000 MCG/ML injection Inject 1,000 mcg into the muscle every 30 (thirty) days.        . famotidine (PEPCID) 20 MG tablet Take 1 tablet (20 mg total) by mouth 2 (two) times daily.  60 tablet  6  . fluocinolone (VANOS) 0.01 % cream       . furosemide (LASIX) 20 MG tablet Take 20 mg by mouth daily. For severe swelling with discomfort.      . isosorbide mononitrate (IMDUR) 30 MG 24 hr tablet TAKE 1/2 TABLET BY MOUTH DAILY  30 tablet  6  . loratadine (CLARITIN) 10 MG tablet Take 10 mg by mouth 2 (two) times daily.      . mometasone (NASONEX) 50 MCG/ACT nasal spray 2 sprays by Nasal route as needed.       . nitroGLYCERIN (NITROSTAT) 0.4 MG SL tablet Place 0.4 mg under the tongue every 5 (five) minutes as needed.        . propranolol (INDERAL) 10 MG tablet TAKE 1 TABLET BY MOUTH THREE TIMES A DAY  180 tablet  1  . rosuvastatin (CRESTOR) 20 MG tablet Take 0.5 tablets (10 mg total) by mouth daily.  15 tablet  5  . sertraline (ZOLOFT) 25 MG tablet TAKE 1 TABLET BY MOUTH EVERY MORNING  30 tablet  11  . SYNTHROID 100 MCG tablet TAKE 1 TABLET BY MOUTH DAILY.  30 tablet  6  . trimethoprim (TRIMPEX) 100 MG tablet Take 100 mg by mouth daily.         Current Facility-Administered Medications  Medication Dose Route Frequency Provider Last Rate Last Dose  . cyanocobalamin ((VITAMIN B-12)) injection 1,000 mcg  1,000 mcg Intramuscular Q30 days Star Age, MD   1,000 mcg at 05/10/13 1240   Allergies as of 07/28/2013  . (No Known Allergies)    reports that she has never smoked. She has never used smokeless tobacco. She reports that she does not drink alcohol or use illicit drugs.     Review of Systems: She has marked weakness of all her extremities, particular her lower extremities and she has a resting tremor Pertinent positive and negative review of systems were noted  in the above HPI section. All other review of systems were otherwise negative.  Vital signs were reviewed in today's medical record Physical Exam: General: Well developed , well nourished, no acute distress Skin: anicteric Head: Normocephalic and atraumatic Eyes:  sclerae anicteric, EOMI Ears: Normal auditory acuity Mouth: No deformity or lesions Neck: Supple, no masses or thyromegaly Lungs: Clear throughout to auscultation Heart: Regular rate and rhythm; no murmurs, rubs or bruits Abdomen: Soft, non tender and non distended. No masses, hepatosplenomegaly or hernias noted. Normal Bowel sounds Rectal:deferred Musculoskeletal: Symmetrical with no gross deformities  Skin: No lesions on visible extremities Pulses:  Normal pulses noted Extremities: No clubbing, cyanosis, edema or deformities noted Neurological: Alert oriented x 4, grossly nonfocal.  Resting tremor of her upper extremities Cervical Nodes:  No significant cervical adenopathy Inguinal Nodes: No significant inguinal adenopathy Psychological:  Alert and cooperative. Normal mood and affect

## 2013-07-28 NOTE — Assessment & Plan Note (Signed)
Dysphagia could be do to a recurrent stricture, motility disorder of the esophagus, or a combination of the 2.  Plan barium swallow.

## 2013-07-28 NOTE — Assessment & Plan Note (Addendum)
Constipation is probably related to her neurological disease.  There are no ominous signs such as rectal bleeding.  Although she is 10 years out from her prior colonoscopy her comorbidities render screening colonoscopy more difficult to do and probably at higher risk for sedation.  Accordingly, I will defer this at this time.  Recommendations #1 trial of Linzess 145 micrograms daily #2 stool Hemoccults

## 2013-08-02 ENCOUNTER — Ambulatory Visit (HOSPITAL_COMMUNITY)
Admission: RE | Admit: 2013-08-02 | Discharge: 2013-08-02 | Disposition: A | Discharge status: Home / Self Care | Payer: Medicare Other | Source: Ambulatory Visit | Attending: Gastroenterology | Admitting: Gastroenterology

## 2013-08-02 ENCOUNTER — Telehealth: Payer: Self-pay | Admitting: *Deleted

## 2013-08-02 ENCOUNTER — Other Ambulatory Visit (HOSPITAL_COMMUNITY): Payer: Medicare Other

## 2013-08-02 DIAGNOSIS — R1310 Dysphagia, unspecified: Secondary | ICD-10-CM | POA: Insufficient documentation

## 2013-08-02 DIAGNOSIS — R1319 Other dysphagia: Secondary | ICD-10-CM

## 2013-08-03 ENCOUNTER — Other Ambulatory Visit (INDEPENDENT_AMBULATORY_CARE_PROVIDER_SITE_OTHER): Payer: Medicare Other

## 2013-08-03 DIAGNOSIS — K5900 Constipation, unspecified: Secondary | ICD-10-CM

## 2013-08-07 NOTE — Progress Notes (Signed)
Quick Note:  Please inform the patient that barium swallow was normal and to continue current plan of action ______

## 2013-08-08 ENCOUNTER — Other Ambulatory Visit: Payer: Self-pay

## 2013-08-08 DIAGNOSIS — K921 Melena: Secondary | ICD-10-CM

## 2013-08-09 NOTE — Telephone Encounter (Signed)
Opened in error

## 2013-08-11 ENCOUNTER — Telehealth: Payer: Self-pay | Admitting: *Deleted

## 2013-08-11 ENCOUNTER — Other Ambulatory Visit: Payer: Medicare Other

## 2013-08-11 ENCOUNTER — Other Ambulatory Visit (INDEPENDENT_AMBULATORY_CARE_PROVIDER_SITE_OTHER): Payer: Medicare Other

## 2013-08-11 DIAGNOSIS — K921 Melena: Secondary | ICD-10-CM

## 2013-08-11 LAB — CBC WITH DIFFERENTIAL/PLATELET
Basophils Relative: 0.6 % (ref 0.0–3.0)
Eosinophils Absolute: 0.1 10*3/uL (ref 0.0–0.7)
MCHC: 34.2 g/dL (ref 30.0–36.0)
MCV: 85.7 fl (ref 78.0–100.0)
Monocytes Absolute: 0.5 10*3/uL (ref 0.1–1.0)
Neutro Abs: 3.1 10*3/uL (ref 1.4–7.7)
Neutrophils Relative %: 51.6 % (ref 43.0–77.0)
RBC: 4.47 Mil/uL (ref 3.87–5.11)

## 2013-08-11 NOTE — Telephone Encounter (Signed)
Pt is scheduled for pre-visit on 08/14/13 and colonosocpy on 08/24/13 at 1600. Pt was seen on 07/28/13 in office, at that time notes were made "comorbidities render screening colonoscopy more difficult to do and probably at higher risk for sedation" pt also has phone call from 07/07/13 with concerns of being able to prep at home because she uses a walker and has fallen several times. Is this pt appropriate for [facility] or does she need to have colonoscopy at [facility]? Please review and advise. Thanks-adm

## 2013-08-11 NOTE — Telephone Encounter (Signed)
Left message for pt to call back  °

## 2013-08-11 NOTE — Telephone Encounter (Signed)
Does the pt know this already?

## 2013-08-11 NOTE — Telephone Encounter (Signed)
Here is the pt I was talking to you about earlier. Please see Dr. Guillermina [place] notes below- she will need to be done at [facility]. I will cancel colonoscopy for 08/24/13 but leave pre-visit on 08/14/13 for now.

## 2013-08-11 NOTE — Telephone Encounter (Signed)
She probably needs to come in the hospital early the day of the procedure to get prepped and we can do her in the afternoon.  Alternatively, she can come the evening before and have her procedure the following morning then be discharged.

## 2013-08-11 NOTE — Telephone Encounter (Signed)
No pt is not aware of needing to be scheduled at hospital.

## 2013-08-14 ENCOUNTER — Encounter (HOSPITAL_COMMUNITY): Payer: Self-pay | Admitting: Pharmacy Technician

## 2013-08-14 ENCOUNTER — Other Ambulatory Visit: Payer: Self-pay

## 2013-08-14 DIAGNOSIS — Z1211 Encounter for screening for malignant neoplasm of colon: Secondary | ICD-10-CM

## 2013-08-14 NOTE — Telephone Encounter (Signed)
Pt to be admitted to [facility] 08/28/13 for prep for Colon. Spoke with bed control and reserved bed. Pt to have Colon at [facility] 08/29/13@10am . Pt aware of appts.

## 2013-08-17 ENCOUNTER — Ambulatory Visit: Payer: Medicare Other

## 2013-08-22 ENCOUNTER — Ambulatory Visit: Payer: Medicare Other

## 2013-08-24 ENCOUNTER — Encounter: Payer: Medicare Other | Admitting: Gastroenterology

## 2013-08-28 ENCOUNTER — Encounter (HOSPITAL_COMMUNITY): Payer: Self-pay | Admitting: *Deleted

## 2013-08-28 ENCOUNTER — Observation Stay (HOSPITAL_COMMUNITY)
Admission: AD | Admit: 2013-08-28 | Discharge: 2013-08-29 | Disposition: A | Discharge status: Home / Self Care | Payer: Medicare Other | Source: Ambulatory Visit | Attending: Gastroenterology | Admitting: Gastroenterology

## 2013-08-28 DIAGNOSIS — Z9181 History of falling: Secondary | ICD-10-CM | POA: Insufficient documentation

## 2013-08-28 DIAGNOSIS — Z79899 Other long term (current) drug therapy: Secondary | ICD-10-CM | POA: Insufficient documentation

## 2013-08-28 DIAGNOSIS — G20 Parkinson's disease: Secondary | ICD-10-CM

## 2013-08-28 DIAGNOSIS — K648 Other hemorrhoids: Secondary | ICD-10-CM | POA: Insufficient documentation

## 2013-08-28 DIAGNOSIS — R1310 Dysphagia, unspecified: Secondary | ICD-10-CM | POA: Insufficient documentation

## 2013-08-28 DIAGNOSIS — E785 Hyperlipidemia, unspecified: Secondary | ICD-10-CM | POA: Insufficient documentation

## 2013-08-28 DIAGNOSIS — R195 Other fecal abnormalities: Principal | ICD-10-CM | POA: Insufficient documentation

## 2013-08-28 DIAGNOSIS — K5900 Constipation, unspecified: Secondary | ICD-10-CM

## 2013-08-28 DIAGNOSIS — K219 Gastro-esophageal reflux disease without esophagitis: Secondary | ICD-10-CM | POA: Insufficient documentation

## 2013-08-28 DIAGNOSIS — J45909 Unspecified asthma, uncomplicated: Secondary | ICD-10-CM | POA: Insufficient documentation

## 2013-08-28 DIAGNOSIS — E039 Hypothyroidism, unspecified: Secondary | ICD-10-CM | POA: Insufficient documentation

## 2013-08-28 DIAGNOSIS — G20A1 Parkinson's disease without dyskinesia, without mention of fluctuations: Secondary | ICD-10-CM | POA: Insufficient documentation

## 2013-08-28 DIAGNOSIS — Z1211 Encounter for screening for malignant neoplasm of colon: Secondary | ICD-10-CM

## 2013-08-28 DIAGNOSIS — I2510 Atherosclerotic heart disease of native coronary artery without angina pectoris: Secondary | ICD-10-CM | POA: Insufficient documentation

## 2013-08-28 LAB — PROTIME-INR
INR: 1.06 (ref 0.00–1.49)
Prothrombin Time: 13.6 seconds (ref 11.6–15.2)

## 2013-08-28 LAB — CBC
MCHC: 33.9 g/dL (ref 30.0–36.0)
MCV: 86.2 fL (ref 78.0–100.0)
Platelets: 225 10*3/uL (ref 150–400)
RBC: 4.42 MIL/uL (ref 3.87–5.11)
WBC: 4.9 10*3/uL (ref 4.0–10.5)

## 2013-08-28 LAB — APTT: aPTT: 35 seconds (ref 24–37)

## 2013-08-28 MED ORDER — ONDANSETRON HCL 4 MG/2ML IJ SOLN: 4.0000 mg | Freq: Four times a day (QID) | INTRAMUSCULAR | Status: DC | PRN

## 2013-08-28 MED ORDER — SERTRALINE HCL 25 MG PO TABS
25.0000 mg | ORAL_TABLET | Freq: Every morning | ORAL | Status: DC
  Administered 2013-08-29: 25 mg via ORAL
  Filled 2013-08-28: qty 1

## 2013-08-28 MED ORDER — SODIUM CHLORIDE 0.9 % IJ SOLN
3.0000 mL | Freq: Two times a day (BID) | INTRAMUSCULAR | Status: DC
  Administered 2013-08-28: 3 mL via INTRAVENOUS

## 2013-08-28 MED ORDER — PEG-KCL-NACL-NASULF-NA ASC-C 100 G PO SOLR: 1.0000 | Freq: Once | ORAL | Status: DC

## 2013-08-28 MED ORDER — FAMOTIDINE 40 MG PO TABS
40.0000 mg | ORAL_TABLET | Freq: Two times a day (BID) | ORAL | Status: DC | PRN
  Filled 2013-08-28: qty 1

## 2013-08-28 MED ORDER — CLONAZEPAM 0.5 MG PO TABS: 0.5000 mg | ORAL_TABLET | Freq: Two times a day (BID) | ORAL | Status: DC | PRN

## 2013-08-28 MED ORDER — BECLOMETHASONE DIPROPIONATE 80 MCG/ACT IN AERS: 1.0000 | INHALATION_SPRAY | Freq: Every day | RESPIRATORY_TRACT | Status: DC | PRN

## 2013-08-28 MED ORDER — ONDANSETRON HCL 4 MG PO TABS: 4.0000 mg | ORAL_TABLET | Freq: Four times a day (QID) | ORAL | Status: DC | PRN

## 2013-08-28 MED ORDER — SODIUM CHLORIDE 0.9 % IV SOLN: INTRAVENOUS | Status: DC

## 2013-08-28 MED ORDER — SODIUM CHLORIDE 0.9 % IV SOLN
INTRAVENOUS | Status: DC
  Administered 2013-08-28: 14:00:00 via INTRAVENOUS

## 2013-08-28 MED ORDER — SODIUM CHLORIDE 0.9 % IV SOLN: 250.0000 mL | INTRAVENOUS | Status: DC | PRN

## 2013-08-28 MED ORDER — HYDROXYZINE HCL 10 MG PO TABS
10.0000 mg | ORAL_TABLET | Freq: Three times a day (TID) | ORAL | Status: DC | PRN
  Filled 2013-08-28: qty 1

## 2013-08-28 MED ORDER — ISOSORBIDE MONONITRATE 15 MG HALF TABLET
15.0000 mg | ORAL_TABLET | Freq: Every evening | ORAL | Status: DC
  Administered 2013-08-28: 15 mg via ORAL
  Filled 2013-08-28 (×2): qty 1

## 2013-08-28 MED ORDER — FLUTICASONE PROPIONATE 50 MCG/ACT NA SUSP
1.0000 | Freq: Every day | NASAL | Status: DC
  Administered 2013-08-29: 1 via NASAL
  Filled 2013-08-28: qty 16

## 2013-08-28 MED ORDER — SODIUM CHLORIDE 0.9 % IJ SOLN: 3.0000 mL | INTRAMUSCULAR | Status: DC | PRN

## 2013-08-28 MED ORDER — LORATADINE 10 MG PO TABS
10.0000 mg | ORAL_TABLET | Freq: Two times a day (BID) | ORAL | Status: DC
  Administered 2013-08-28 – 2013-08-29 (×2): 10 mg via ORAL
  Filled 2013-08-28 (×3): qty 1

## 2013-08-28 MED ORDER — PEG-KCL-NACL-NASULF-NA ASC-C 100 G PO SOLR
0.5000 | Freq: Once | ORAL | Status: AC
  Administered 2013-08-29: 100 g via ORAL

## 2013-08-28 MED ORDER — LEVOTHYROXINE SODIUM 100 MCG PO TABS
100.0000 ug | ORAL_TABLET | Freq: Every day | ORAL | Status: DC
  Administered 2013-08-29: 100 ug via ORAL
  Filled 2013-08-28 (×2): qty 1

## 2013-08-28 MED ORDER — ALBUTEROL SULFATE HFA 108 (90 BASE) MCG/ACT IN AERS: 2.0000 | INHALATION_SPRAY | Freq: Four times a day (QID) | RESPIRATORY_TRACT | Status: DC | PRN

## 2013-08-28 MED ORDER — PROPRANOLOL HCL 10 MG PO TABS
10.0000 mg | ORAL_TABLET | Freq: Three times a day (TID) | ORAL | Status: DC | PRN
  Filled 2013-08-28: qty 1

## 2013-08-28 MED ORDER — PEG-KCL-NACL-NASULF-NA ASC-C 100 G PO SOLR
0.5000 | Freq: Once | ORAL | Status: AC
  Administered 2013-08-28: 100 g via ORAL
  Filled 2013-08-28: qty 1

## 2013-08-28 NOTE — Care Management Note (Signed)
   CARE MANAGEMENT NOTE 08/28/2013  Patient:  [name] [name], [name] [name]   Account Number:  [id number]  Date Initiated:  08/28/2013  Documentation initiated by:  Eaton Folmar  Subjective/Objective Assessment:   72 yo female admitted with positive hemoccult, bowel prep for colonoscopy.     Action/Plan:   Home when stable   Anticipated DC Date:     Anticipated DC Plan:  [place]  CM consult      Choice offered to / List presented to:  NA   DME arranged  NA      DME agency  NA     [place] arranged  NA      [place] agency  NA   Status of service:  In process, will continue to follow Medicare Important Message given?   (If response is "NO", the following Medicare IM given date fields will be blank) Date Medicare IM given:   Date Additional Medicare IM given:    Discharge Disposition:    Per UR Regulation:  Reviewed for med. necessity/level of care/duration of stay  If discussed at [place] of Stay Meetings, dates discussed:    Comments:  08/28/13 [place] Chart reviewed for utilization of services. Per MD pt experiencing frequent falls at home. PT order entered. Will continue to follow for further dc needs.

## 2013-08-28 NOTE — H&P (Signed)
Primary Care Physician:  Adella Hare, MD Primary Gastroenterologist:   Alben Spittle, MD   HPI: [name] [name] is a 71 y.o. female evaluated by Dr. Deatra Ina last month for constipation and intermittent solid food dyspshagia. Constipation felt to be related to her neurological disease, she was given a trial of Linzess. Barium swallow was obtained and normal. Patient was due for colonoscopy but given multiple co morbidities she was felt high risk for sedation. Hemoccult cards were ordered instead but returned as positive. Patient was subsequently scheduled for colonoscopy. Because of multiple medical problems, unsteady gait and frequent falls there was concern for patient being able to safely prepped bowels at home. The decision was therefore made to admit patient to observation for assistance with bowel prep.   Constipation has improved with Linzess. Still still has occasional solid food dysphagia, no other GI complaints.   Past Medical History  Diagnosis Date  . GERD (gastroesophageal reflux disease)   . Familial tremor     followed by Dr. Erling Cruz  . Unspecified chronic bronchitis   . Irritable bladder   . Pneumonia   . Hypothyroidism   . Depression   . Asthma   . CAD (coronary artery disease)     myoview 5/08:  EF 76%, no scar, no ischemia, +ECG changes with exercise;  cath 04/08/07:  pLAD 30%, mLAD 60-70% - med tx.  Marland Kitchen HLD (hyperlipidemia)   . 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 Laparoscopic nissen fundoplication    . Cystectomy    . Tumor removal    . Tubal ligation      Prior to Admission medications   Medication Sig Start Date End Date Taking? Authorizing Provider  albuterol (PROAIR HFA) 108 (90 BASE) MCG/ACT inhaler Inhale 2 puffs into the lungs every 6 (six) hours as needed for wheezing or shortness of breath.     Historical Provider, MD  beclomethasone (QVAR) 80 MCG/ACT inhaler  Inhale 1 puff into the lungs daily as needed (wheezing).     Historical Provider, MD  Calcium Carbonate-Vitamin D (CALCIUM-VITAMIN D) 500-200 MG-UNIT per tablet Take 1 tablet by mouth daily.      Historical Provider, MD  clonazePAM (KLONOPIN) 0.5 MG tablet Take 0.5 mg by mouth 2 (two) times daily as needed (tremors).    Historical Provider, MD  cyanocobalamin (,VITAMIN B-12,) 1000 MCG/ML injection Inject 1,000 mcg into the muscle every 30 (thirty) days.      Historical Provider, MD  famotidine (PEPCID) 40 MG tablet Take 40 mg by mouth 2 (two) times daily as needed for heartburn.    Historical Provider, MD  fluocinolone (VANOS) 0.01 % cream Apply 1 application topically 2 (two) times daily as needed (itching).  07/15/13   Historical Provider, MD  furosemide (LASIX) 20 MG tablet Take 20 mg by mouth daily as needed for fluid or edema. For severe swelling with discomfort. 08/24/12   Neena Rhymes, MD  hydrOXYzine (ATARAX/VISTARIL) 10 MG tablet Take 10 mg by mouth 3 (three) times daily as needed for itching.    Historical Provider, MD  isosorbide mononitrate (IMDUR) 30 MG 24 hr tablet Take 15 mg by mouth every evening.    Historical Provider, MD  levothyroxine (SYNTHROID, LEVOTHROID) 100 MCG tablet Take 100 mcg by mouth daily before breakfast.    Historical Provider, MD  loratadine (CLARITIN) 10 MG tablet Take 10 mg by mouth 2 (  two) times daily.    Historical Provider, MD  mometasone (NASONEX) 50 MCG/ACT nasal spray Place 2 sprays into the nose daily as needed (allergies).     Historical Provider, MD  nitroGLYCERIN (NITROSTAT) 0.4 MG SL tablet Place 0.4 mg under the tongue every 5 (five) minutes as needed.      Historical Provider, MD  propranolol (INDERAL) 10 MG tablet Take 10 mg by mouth 3 (three) times daily as needed (tremors).    Historical Provider, MD  rosuvastatin (CRESTOR) 20 MG tablet Take 10 mg by mouth every evening.    Historical Provider, MD  sertraline (ZOLOFT) 25 MG tablet Take 25 mg by  mouth every morning.    Historical Provider, MD    Current Facility-Administered Medications  Medication Dose Route Frequency Provider Last Rate Last Dose  . 0.9 %  sodium chloride infusion   Intravenous Continuous Inda Castle, MD      . 0.9 %  sodium chloride infusion  250 mL Intravenous PRN Willia Craze, NP      . 0.9 %  sodium chloride infusion   Intravenous Continuous Willia Craze, NP      . ondansetron [facility]) tablet 4 mg  4 mg Oral Q6H PRN Willia Craze, NP       Or  . ondansetron [facility]) injection 4 mg  4 mg Intravenous Q6H PRN Willia Craze, NP      . sodium chloride 0.9 % injection 3 mL  3 mL Intravenous Q12H Willia Craze, NP      . sodium chloride 0.9 % injection 3 mL  3 mL Intravenous PRN Willia Craze, NP        Allergies as of 08/14/2013  . (No Known Allergies)    Family History  Problem Relation Age of Onset  . Coronary artery disease    . Heart attack    . Hypertension    . Diabetes    . Breast cancer    . Hypertension Mother   . Heart disease Father     History   Social History  . Marital Status: Married    Spouse Name: N/A    Number of Children: N/A  . Years of Education: N/A    Social History Main Topics  . Smoking status: Never Smoker   . Smokeless tobacco: Never Used  . Alcohol Use: No  . Drug Use: No  . Sexual Activity: Not Currently    Social History Narrative   HSG, retired from [employer]ch[employer], [employer] . married '60. 1 son - '65; 1 daughter - '61; 2 grandchildren; 1 great-grandchild. Homemaker. marriage in good health. 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 Positive history of passive tobacco smoke exposure.    Review of Systems: All systems reviewed an negative except where noted in HPI.  Physical Exam: Vital signs in last 24 hours: Temp:  [97.8 F (36.6 C)] 97.8 F (36.6 C) (10/20 1244) Pulse Rate:  [61] 61 (10/20 1244) BP: (127)/(68) 127/68 mmHg (10/20 1244) SpO2:  [100 %] 100 % (10/20 1244) Last BM Date: 08/27/13 General:   Pleasant, well-developed, white female in NAD Head:  Normocephalic and atraumatic. Eyes:  Sclera clear, no icterus.   Conjunctiva pink. Ears:  Normal auditory acuity. Neck:  Supple; no masses . Lungs:  Clear throughout to auscultation.   No wheezes, crackles, or rhonchi. No acute distress. Heart:  Regular rate and rhythm Abdomen:  Soft, nondistended, nontender. No masses, hepatomegaly. No obvious masses.  Normal bowel .  Msk:  Symmetrical without gross deformities.. Extremities:  1+BLE edema. Neurologic:  Alert and  oriented x4;  grossly normal neurologically. Skin:  Intact without significant lesions or rashes. Cervical Nodes:  No significant cervical adenopathy. Psych:  Alert and cooperative. Normal mood and affect.  Impression / Plan:  48. 72 year old female with heme positive stools. Patient has gait disorder, recurrent falls followed by neurology. She is unable to safely undergo bowel prep at home. Will admit to observation and prep bowels this evening for am colonoscopy with Dr. Deatra Ina. Will continue home medications. Hopefully home tomorrow following procedure.  2. Multiple medical problems. No acute issues at present. Will continue home medications.     LOS: 0 days   Tye Savoy  08/28/2013, 1:16 PM     Attending physician's note   I have taken a history, examined the patient and reviewed the chart. I agree with the Advanced Practitioner's note, impression and recommendations. Heme positive stool and constipation for colonoscopy tomorrow as arranged by Dr. Deatra Ina. She is admitted to observation for bowel prep due to history of an unsteady gait and frequent falls.  Ladene Artist, MD [facility]

## 2013-08-28 NOTE — Evaluation (Signed)
Physical Therapy Evaluation Patient Details Name: [name] [name] MRN: [id number] DOB: 11-02-1941 Today's Date: 08/28/2013 Time: 6945-0388 PT Time Calculation (min): 16 min  PT Assessment / Plan / Recommendation History of Present Illness  72 yo female admitted with constipation, colonoscopy. Hx of neurological disease-multiple systems atrophy.   Clinical Impression  On eval, pt required Min assist for stand, Min guard assist for ambulation with RW. Pt is very aware of her condition and deficits. Verbalizes ability to perform HEP. Pt states her plan is to resume OP PT around early December after MD visit with outpatient Neurologist. Feel this is best plan of action for pt and pt is in agreement. Husband assists with care at home. Feel OP PT is most beneficial rehab program for pt. Will follow during stay. Short session due to MD awaiting to visit with pt.     PT Assessment  Patient needs continued PT services    Follow Up Recommendations  Outpatient PT (pt will arrange this with MD on next outpatient neurology visit); 24 hour supervision/assist    Does the patient have the potential to tolerate intense rehabilitation      Barriers to Discharge        Equipment Recommendations  None recommended by PT    Recommendations for Other Services     Frequency Min 3X/week    Precautions / Restrictions Precautions Precautions: Fall Precaution Comments: multiple falls, gait disorder Restrictions Weight Bearing Restrictions: No   Pertinent Vitals/Pain No c/o pain      Mobility  Bed Mobility Bed Mobility: Supine to Sit;Sit to Supine Supine to Sit: 4: Min guard Sit to Supine: 4: Min guard Details for Bed Mobility Assistance: Increased time.  Transfers Transfers: Sit to Stand;Stand to Sit Sit to Stand: 4: Min assist;From toilet;From bed Stand to Sit: 4: Min assist;To bed;To toilet Details for Transfer Assistance: Assist to rise, stabilize, control descent. VCs technique/anterior  weight shift Ambulation/Gait Ambulation/Gait Assistance: 4: Min guard Ambulation Distance (Feet): 15 Feet (x2) Assistive device: Rolling walker Ambulation/Gait Assistance Details: Decreased flexion noted on R side. Tends to maintain stiff leg during swing through phase. Short, choppy steps.  Gait Pattern: Decreased step length - left;Decreased step length - right;Decreased stride length Stairs: No    Exercises     PT Diagnosis: Difficulty walking;Abnormality of gait  PT Problem List: Decreased strength;Decreased mobility;Decreased balance;Decreased range of motion;Decreased coordination PT Treatment Interventions: DME instruction;Gait training;Functional mobility training;Therapeutic activities;Therapeutic exercise;Patient/family education;Balance training     PT Goals(Current goals can be found in the care plan section) Acute Rehab PT Goals Patient Stated Goal: maintain mobility. resume OP PT ~early december PT Goal Formulation: With patient Time For Goal Achievement: 09/11/13 Potential to Achieve Goals: Good  Visit Information  Last PT Received On: 08/28/13 Assistance Needed: +1 History of Present Illness: 72 yo female admitted with constipation, colonoscopy. Hx of neurological disease-multiple systems atrophy.        Prior [place] expects to be discharged to:: Private residence Living Arrangements: Spouse/significant other Type of Home: House Home Access: Stairs to enter [employer] of Steps: 2 Entrance Stairs-Rails: Right Home Layout: One [place]: Clinical cytogeneticist - 2 wheels;Bedside commode Prior Function Level of Independence: Independent with assistive device(s);Needs assistance Gait / Transfers Assistance Needed: uses walker ADL's / Homemaking Assistance Needed: Moderate assist for dressing, in/out of shower Communication Communication: No difficulties    Cognition  Cognition Arousal/Alertness:  Awake/alert Behavior During Therapy: WFL for tasks assessed/performed Overall Cognitive Status: Within  Functional Limits for tasks assessed    Extremity/Trunk Assessment Lower Extremity Assessment Lower Extremity Assessment: RLE deficits/detail;LLE deficits/detail RLE Deficits / Details: Strength at least 3/5 throughout LLE Deficits / Details: Strength at least 3+/5 throughout Cervical / Trunk Assessment Cervical / Trunk Assessment: Normal   Balance    End of Session PT - End of Session Activity Tolerance: Patient tolerated treatment well Patient left: in bed;with call bell/phone within reach  GP Functional Assessment Tool Used: clinical judgement Functional Limitation: Mobility: Walking and moving around Mobility: Walking and Moving Around Current Status (L2004): At least 20 percent but less than 40 percent impaired, limited or restricted Mobility: Walking and Moving Around Goal Status [phone]): At least 1 percent but less than 20 percent impaired, limited or restricted   Weston Anna, MPT Pager: [phone]

## 2013-08-28 NOTE — Progress Notes (Signed)
Pt arrived to room 1525 via wheelchair from admissions.  Pt is alert and oriented.  Oriented pt and husband to room and unit.  Notified [place], [place] that pt was here.  Call bell is within reach.  Will continue to monitor.

## 2013-08-29 ENCOUNTER — Encounter (HOSPITAL_COMMUNITY): Payer: Medicare Other | Admitting: Anesthesiology

## 2013-08-29 ENCOUNTER — Encounter (HOSPITAL_COMMUNITY): Payer: Self-pay

## 2013-08-29 ENCOUNTER — Observation Stay (HOSPITAL_COMMUNITY): Payer: Medicare Other | Admitting: Anesthesiology

## 2013-08-29 ENCOUNTER — Encounter (HOSPITAL_COMMUNITY)
Admission: AD | Disposition: A | Discharge status: Home / Self Care | Payer: Self-pay | Source: Ambulatory Visit | Attending: Gastroenterology

## 2013-08-29 DIAGNOSIS — Z1211 Encounter for screening for malignant neoplasm of colon: Secondary | ICD-10-CM

## 2013-08-29 DIAGNOSIS — K648 Other hemorrhoids: Secondary | ICD-10-CM

## 2013-08-29 HISTORY — PX: COLONOSCOPY: SHX5424

## 2013-08-29 SURGERY — COLONOSCOPY
Anesthesia: Monitor Anesthesia Care

## 2013-08-29 MED ORDER — PROPOFOL 10 MG/ML IV BOLUS
INTRAVENOUS | Status: DC | PRN
  Administered 2013-08-29: 40 mg via INTRAVENOUS
  Administered 2013-08-29: 100 mg via INTRAVENOUS
  Administered 2013-08-29 (×2): 20 mg via INTRAVENOUS

## 2013-08-29 MED ORDER — LACTATED RINGERS IV SOLN
INTRAVENOUS | Status: DC | PRN
  Administered 2013-08-29: 10:00:00 via INTRAVENOUS

## 2013-08-29 NOTE — Progress Notes (Signed)
Pt going home following colonoscopy.  Does not have Linzess 145 mcg, one po q day.listed on PTA med lists, she has been taking this however.  I advised her to continue this.   [name] [name] [phone].

## 2013-08-29 NOTE — Anesthesia Preprocedure Evaluation (Signed)
Anesthesia Evaluation  Patient identified by MRN, date of birth, ID band Patient awake    Reviewed: Allergy & Precautions, H&P , NPO status , Patient's Chart, lab work & pertinent test results  Airway Mallampati: II TM Distance: >3 FB Neck ROM: Full    Dental no notable dental hx.    Pulmonary asthma ,  breath sounds clear to auscultation  Pulmonary exam normal       Cardiovascular hypertension, Pt. on medications + CAD (60-70% LAD) Rhythm:Regular Rate:Normal     Neuro/Psych Essential tremor  negative psych ROS   GI/Hepatic negative GI ROS, Neg liver ROS,   Endo/Other  negative endocrine ROSHypothyroidism   Renal/GU negative Renal ROS  negative genitourinary   Musculoskeletal negative musculoskeletal ROS (+)   Abdominal   Peds negative pediatric ROS (+)  Hematology negative hematology ROS (+)   Anesthesia Other Findings   Reproductive/Obstetrics negative OB ROS                           Anesthesia Physical Anesthesia Plan  ASA: III  Anesthesia Plan: MAC   Post-op Pain Management:    Induction:   Airway Management Planned: Simple Face Mask  Additional Equipment:   Intra-op Plan:   Post-operative Plan:   Informed Consent: I have reviewed the patients History and Physical, chart, labs and discussed the procedure including the risks, benefits and alternatives for the proposed anesthesia with the patient or authorized representative who has indicated his/her understanding and acceptance.   Dental advisory given  Plan Discussed with: CRNA  Anesthesia Plan Comments:         Anesthesia Quick Evaluation

## 2013-08-29 NOTE — Progress Notes (Signed)
Colonoscopy was remarkable only for small internal hemorrhoids.  No source for Hemoccult-positive stool is seen.  Plan followup Hemoccults in approximately one week.  Patient  should continue Linzess and fiber supplementation

## 2013-08-29 NOTE — Anesthesia Postprocedure Evaluation (Signed)
  Anesthesia Post-op Note  Patient: [name] [name]  Procedure(s) Performed: Procedure(s) (LRB): COLONOSCOPY (N/A)  Patient Location: PACU  Anesthesia Type: MAC  Level of Consciousness: awake and alert   Airway and Oxygen Therapy: Patient Spontanous Breathing  Post-op Pain: mild  Post-op Assessment: Post-op Vital signs reviewed, Patient's Cardiovascular Status Stable, Respiratory Function Stable, Patent Airway and No signs of Nausea or vomiting  Last Vitals:  Filed Vitals:   08/29/13 1146  BP: 122/76  Pulse: 73  Temp: 36.4 C  Resp: 16    Post-op Vital Signs: stable   Complications: No apparent anesthesia complications

## 2013-08-29 NOTE — Preoperative (Signed)
Beta Blockers   Reason not to administer Beta Blockers:Not Applicable 

## 2013-08-29 NOTE — Transfer of Care (Signed)
Immediate Anesthesia Transfer of Care Note  Patient: [name] [name]  Procedure(s) Performed: Procedure(s): COLONOSCOPY (N/A)  Patient Location: PACU and Endoscopy Unit  Anesthesia Type:MAC  Level of Consciousness: awake, alert , oriented and patient cooperative  Airway & Oxygen Therapy: Patient Spontanous Breathing and Patient connected to nasal cannula oxygen  Post-op Assessment: Report given to PACU RN and Post -op Vital signs reviewed and stable  Post vital signs: Reviewed and stable  Complications: No apparent anesthesia complications

## 2013-08-29 NOTE — Discharge Summary (Signed)
[facility] Discharge Summary  Name: [name] [name] MRN: [id number] DOB: [birth year] 72 y.o. PCP:  Adella Hare, MD  Date of Admission: 08/28/2013 11:56 AM Date of Discharge: 08/29/2013 Attending Physician: Erskine Emery MD  Admitting Dignosis: 1.  Constipation. 2.  Fecal occult blood +.  Discharge Diagnosis: 1.  Constipation, FOB + stool.  Pt underwent screening colonoscopy after safely completing bowel prep.  This study showed internal hemorrhoids and otherwise unremarkable colonic mucosa. .   Previous Medical/Surgical history Past Medical History  Diagnosis Date  . GERD (gastroesophageal reflux disease)   . Familial tremor     followed by Dr. Erling Cruz  . Unspecified chronic bronchitis   . Vaginitis   . Irritable bladder   . Pneumonia   . Hypothyroidism   . Depression   . Asthma   . CAD (coronary artery disease)     myoview 5/08:  EF 76%, no scar, no ischemia, +ECG changes with exercise;  cath 04/08/07:  pLAD 30%, mLAD 60-70% - med tx.  Marland Kitchen HLD (hyperlipidemia)   . 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 Laparoscopic nissen fundoplication    . Cystectomy    . Tumor removal    . Tubal ligation                                                                Brief History:  Pt with hx constipation and solid food dysphagia.  S/p previous nissen fundoplication. Normal barium swallow on 08/02/13. Stool tested positive for blood on 9/25 though she did not report any rectal bleeding or melena.  Her latest colonoscopy was in 2004.  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  She suffers falls with some frequency.  She was admitted for overnight bowel prep to [place] hospital due to her high risk of falling.                                                             Hospital Course by problem  list:  Pt underwent her colon prep and colonoscopy without incidence. She was discharged to home following the colonoscopy. No adjustments were made to her meds.   Wt Readings from Last 1 Encounters:  08/29/13 64.2 kg (141 lb 8.6 oz)       Medication List         beclomethasone 80 MCG/ACT inhaler  Commonly known as:  QVAR  Inhale 1 puff into the lungs daily as needed (wheezing).     calcium-vitamin D 500-200 MG-UNIT per tablet  Take 1 tablet by mouth daily.     clonazePAM 0.5 MG tablet  Commonly known as:  KLONOPIN  Take 0.5 mg by mouth 2 (two) times daily as needed (tremors).     cyanocobalamin 1000 MCG/ML injection  Commonly known as:  (VITAMIN B-12)  Inject 1,000 mcg into the muscle every 30 (thirty) days.     famotidine 40 MG tablet  Commonly known as:  PEPCID  Take 40 mg by mouth 2 (two) times daily as needed for heartburn.     fluocinolone 0.01 % cream  Commonly known as:  VANOS  Apply 1 application topically 2 (two) times daily as needed (itching).     furosemide 20 MG tablet  Commonly known as:  LASIX  Take 20 mg by mouth daily as needed for fluid or edema. For severe swelling with discomfort.     hydrOXYzine 10 MG tablet  Commonly known as:  ATARAX/VISTARIL  Take 10 mg by mouth 3 (three) times daily as needed for itching.     isosorbide mononitrate 30 MG 24 hr tablet  Commonly known as:  IMDUR  Take 15 mg by mouth every evening.     levothyroxine 100 MCG tablet  Commonly known as:  SYNTHROID, LEVOTHROID  Take 100 mcg by mouth daily before breakfast.     loratadine 10 MG tablet  Commonly known as:  CLARITIN  Take 10 mg by mouth 2 (two) times daily.     mometasone 50 MCG/ACT nasal spray  Commonly known as:  NASONEX  Place 2 sprays into the nose daily as needed (allergies).     nitroGLYCERIN 0.4 MG SL tablet  Commonly known as:  NITROSTAT  Place 0.4 mg under the tongue every 5 (five) minutes as needed.     PROAIR HFA 108 (90 BASE) MCG/ACT inhaler   Generic drug:  albuterol  Inhale 2 puffs into the lungs every 6 (six) hours as needed for wheezing or shortness of breath.     propranolol 10 MG tablet  Commonly known as:  INDERAL  Take 10 mg by mouth 3 (three) times daily as needed (tremors).     rosuvastatin 20 MG tablet  Commonly known as:  CRESTOR  Take 10 mg by mouth every evening.     sertraline 25 MG tablet  Commonly known as:  ZOLOFT  Take 25 mg by mouth every morning.          Linzess  145 mcg      One po daily.   Consultations:  None  Procedures Performed:  Colonoscopy  08/29/13 For FOB + stool.  By Dr Deatra Ina ENDOSCOPIC IMPRESSION:  1. Internal hemorrhoids  2. The colon was otherwise normal RECOMMENDATIONS:  1. High fiber diet with liberal fluid intake.  2. Followup hemeoccults in 1 week     Discharge Labs: No results found for this or any previous visit (from the past 24 hour(s)).  Disposition and follow-up:   [name] was discharged from  in stable condition.  Follow up Appointments:  GI follow up prn.      Discharge Orders   Future Appointments Provider Department Dept Phone   10/31/2013 12:00 PM Star Age, MD [facility] [phone]   Future Orders Complete By Expires   Care order/instruction  As directed    Scheduling Instructions:     Remove IV   Diet - low sodium heart healthy  As directed    Increase activity slowly  As directed          Time Spent on discharge:  20 minutes   Signed: Mordecai Maes  [phone] 08/29/2013, 2:53 PM

## 2013-08-29 NOTE — Interval H&P Note (Signed)
History and Physical Interval Note:  08/29/2013 10:07 AM  [name] [name]  has presented today for surgery, with the diagnosis of Screening for colon cancer [V76.51]  The various methods of treatment have been discussed with the patient and family. After consideration of risks, benefits and other options for treatment, the patient has consented to  Procedure(s): COLONOSCOPY (N/A) as a surgical intervention .  The patient's history has been reviewed, patient examined, no change in status, stable for surgery.  I have reviewed the patient's chart and labs.  Questions were answered to the patient's satisfaction.    The recent H&P (dated 08/28/13**) was reviewed, the patient was examined and there is no change in the patients condition since that H&P was completed.   Erskine Emery  08/29/2013, 10:07 AM    Erskine Emery

## 2013-08-29 NOTE — Op Note (Signed)
[facility] [place], [address]   COLONOSCOPY PROCEDURE REPORT  PATIENT: [name] [name], [name] [name]  MR#: [id number] BIRTHDATE: 1941-01-16 , 72  yrs. old GENDER: Female ENDOSCOPIST: Inda Castle, MD REFERRED BY: PROCEDURE DATE:  08/29/2013 PROCEDURE:   Colonoscopy, diagnostic ASA CLASS:   Class III INDICATIONS:heme-positive stool. MEDICATIONS: MAC sedation, administered by CRNA  DESCRIPTION OF PROCEDURE:   After the risks benefits and alternatives of the procedure were thoroughly explained, informed consent was obtained.  A digital rectal exam revealed no abnormalities of the rectum.   The Pentax Colonoscope [id number] endoscope was introduced through the anus and advanced to the cecum, which was identified by both the appendix and ileocecal valve. No adverse events experienced.   The quality of the prep was excellent, using MoviPrep  The instrument was then slowly withdrawn as the colon was fully examined.      COLON FINDINGS: Internal hemorrhoids were found.   The colon was otherwise normal.  There was no diverticulosis, inflammation, polyps or cancers unless previously stated.  Retroflexed views revealed no abnormalities. The time to cecum=  .  Withdrawal time=12 minutes 0 seconds.  The scope was withdrawn and the procedure completed. COMPLICATIONS: There were no complications.  ENDOSCOPIC IMPRESSION: 1.   Internal hemorrhoids 2.   The colon was otherwise normal  RECOMMENDATIONS: 1.  High fiber diet with liberal fluid intake. 2.  Followup hemeoccults in 1 week   eSigned:  Inda Castle, MD 08/29/2013 10:55 AM   cc: Neena Rhymes, MD and Marylynn Pearson MD

## 2013-08-29 NOTE — Progress Notes (Signed)
Patient is alert and oriented, discharge instructions reviewed with patient and spouse, patient with no complaints of pain, iv removed Neta Mends RN 08-29-2013 13:13pm

## 2013-08-30 ENCOUNTER — Encounter (HOSPITAL_COMMUNITY): Payer: Self-pay | Admitting: Gastroenterology

## 2013-09-01 ENCOUNTER — Ambulatory Visit (INDEPENDENT_AMBULATORY_CARE_PROVIDER_SITE_OTHER): Payer: Medicare Other | Admitting: *Deleted

## 2013-09-01 DIAGNOSIS — E538 Deficiency of other specified B group vitamins: Secondary | ICD-10-CM

## 2013-09-01 NOTE — Patient Instructions (Signed)
See pt on 09-05-13 at 1100 for evaluation.   Increased falls (7 within last week).Marland Kitchen

## 2013-09-01 NOTE — Progress Notes (Signed)
Pt here for B 12 injection.  Under aseptic technique cyanocobalamin 1071mg/1ml IM given L deltoid.  Tolerated well.  Bandaid applied.

## 2013-09-05 ENCOUNTER — Ambulatory Visit (INDEPENDENT_AMBULATORY_CARE_PROVIDER_SITE_OTHER): Payer: Medicare Other | Admitting: Neurology

## 2013-09-05 ENCOUNTER — Encounter: Payer: Self-pay | Admitting: Neurology

## 2013-09-05 VITALS — BP 133/72 | HR 61 | Temp 97.5°F | Ht 63.0 in | Wt 146.0 lb

## 2013-09-05 DIAGNOSIS — G20 Parkinson's disease: Secondary | ICD-10-CM

## 2013-09-05 DIAGNOSIS — G250 Essential tremor: Secondary | ICD-10-CM

## 2013-09-05 DIAGNOSIS — R269 Unspecified abnormalities of gait and mobility: Secondary | ICD-10-CM

## 2013-09-05 DIAGNOSIS — Z9181 History of falling: Secondary | ICD-10-CM

## 2013-09-05 DIAGNOSIS — E538 Deficiency of other specified B group vitamins: Secondary | ICD-10-CM

## 2013-09-05 DIAGNOSIS — R296 Repeated falls: Secondary | ICD-10-CM

## 2013-09-05 NOTE — Progress Notes (Signed)
Subjective:    Patient ID: [name] [name] is a 71 y.o. female.  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 [name] [name] is accompanied by [name] [name] husband again today and presents for a sooner than scheduled appointment due to recurrent falls recently. [name] [name] fell 6 times in 1 1/2 weeks, but thankfully did not injure herself. I first met [name] [name] on 05/10/2013, at which time we discussed the possibility of MSA. I did not make any medication changes. We talked about the nature of progressive parkinsonism and gait disorder. [name] [name] had [name] [name] last outpatient PT in July 2014. [name] [name] tries to do stretches at home. [name] [name] had a colonoscopy recently.  [name] [name] previously followed with Dr. Morene Antu and was last seen by him on 12/30/2012, at which time he felt that [name] [name] had worsening of [name] [name] gait. [name] [name] also had symptoms of vertigo. [name] [name] has an underlying medical history of thyroid disease, [place] spotted fever at age 77, asthma, cardiac disease, essential tremor, hearing loss, hyperlipidemia, reflux disease with surgical repair. [name] [name] is currently on baby aspirin, Synthroid, Crestor, and/or, clonazepam, propranolol, sertraline, trimethoprim, Nasonex, Provera, nitroglycerin as needed, vitamin B12.  [name] [name] was diagnosed about 20 years ago with essential tremor. [name] [name] had head tremor more than upper extremity tremor. [name] [name] has a history of lung disease but was able to take low-dose propranolol. [name] [name] has not tried primidone. [name] [name] has had gait and balance problems in the last 5 years approximately. [name] [name] has to hold onto something. [name] [name] has had bladder incontinence since [name] [name] 44s. [name] [name] was on Crestor for 3 years which was discontinued in June 2011. CK was normal and EMG nerve conduction studies were normal in June 2011. Crestor was restarted. [name] [name] has been on B12 injections without subjective improvement. MRI  brain with and without contrast showed hyperostosis frontalis, MRI C-spine showed mild degenerative joint disease. 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 Lumbar spine MRI from July 2012 showed mild degenerative disc disease. 4 gait dysfunction [name] [name] was referred to Dr. Gilford Rile with a question of myelopathy versus orthostatic tremor versus lower half parkinsonism versus essential tremor with gait disorder. 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 Further testing revealed negative tilt table test but there was mild autonomic neuropathy per autonomic reflex screen. [name] [name] has had swelling in both legs with negative Doppler of the legs and normal echocardiogram. Additional workup at [facility] included vitamin D, paraneoplastic antibodies, anti-GAD, all negative.  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 [name] [name] has been using a 2 wheeled walker for the past 2 years.  [name] [name] has been in PT and OT with improvement.  [name] [name] does endorse a lot of stress, due primarily to [name] [name] mother's and daughter's health. Mother is 50 and in a NH. [name] [name] daughter has advanced MS.  [name] [name] sleeps in a recliner, d/t pain in [name] [name] trunk, back, neck and stiffness and difficulty getting in and out of bed.   [name] [name] Past Medical History Is Significant For: Past Medical History  Diagnosis Date  . GERD (gastroesophageal reflux  disease)   . Familial tremor     followed by Dr. Erling Cruz  . Unspecified chronic bronchitis   . Vaginitis   . Irritable bladder   . Pneumonia   . Hypothyroidism   . Depression   . Asthma   . CAD (coronary artery disease)     myoview 5/08:  EF 76%, no scar, no ischemia, +ECG changes with exercise;  cath 04/08/07:  pLAD 30%,  mLAD 60-70% - med tx.  Marland Kitchen HLD (hyperlipidemia)   . 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 Laparoscopic nissen fundoplication    . Cystectomy    . Tumor removal    . Tubal ligation    . Colonoscopy N/A 08/29/2013    Procedure: COLONOSCOPY;  Surgeon: Inda Castle, MD;  Location: [facility];  Service: Endoscopy;  Laterality: N/A;    [name] [name] Family History Is Significant For: Family History  Problem Relation Age of Onset  . Coronary artery disease    . Heart attack    . Hypertension    . Diabetes    . Breast cancer    . Hypertension Mother   . Heart disease Father     [name] [name] Social History Is Significant For: History   Social History  . Marital Status: Married    Spouse Name: N/A    Number of Children: N/A  . Years of Education: N/A   Social History Main Topics  . Smoking status: Never Smoker   . Smokeless tobacco: Never Used  . Alcohol Use: No  . Drug Use: No  . Sexual Activity: Not Currently   Other Topics Concern  . None   Social History Narrative   HSG, retired from [employer] . married '60. 1 son - '65; 1 daughter - '61; 2 grandchildren; 1 great-grandchild. Homemaker. marriage in good health. primary care-giver for [name] [name] mother. Positive history of passive tobacco smoke exposure.    [name] [name] Allergies Are:  No Known Allergies:   [name] [name] Current Medications Are:  Outpatient Encounter Prescriptions as of 09/05/2013  Medication Sig Dispense Refill  . AFLURIA PRESERVATIVE FREE injection       . albuterol (PROAIR HFA) 108 (90 BASE) MCG/ACT inhaler Inhale 2 puffs into the lungs every 6 (six) hours as needed for wheezing or shortness of breath.       . beclomethasone (QVAR) 80 MCG/ACT inhaler Inhale 1 puff into the lungs daily as needed (wheezing).       . Calcium Carbonate-Vitamin D (CALCIUM-VITAMIN D) 500-200 MG-UNIT per tablet Take 1  tablet by mouth daily.        . clonazePAM (KLONOPIN) 0.5 MG tablet Take 0.5 mg by mouth 2 (two) times daily as needed (tremors).      . cyanocobalamin (,VITAMIN B-12,) 1000 MCG/ML injection Inject 1,000 mcg into the muscle every 30 (thirty) days.        . famotidine (PEPCID) 40 MG tablet Take 40 mg by mouth 2 (two) times daily as needed for heartburn.      . fluocinolone (VANOS) 0.01 % cream Apply 1 application topically 2 (two) times daily as needed (itching).       . furosemide (LASIX) 20 MG tablet Take 20 mg by mouth daily as needed for fluid or edema. For severe swelling with discomfort.      . hydrOXYzine (ATARAX/VISTARIL) 10 MG tablet Take 10 mg  by mouth 3 (three) times daily as needed for itching.      . isosorbide mononitrate (IMDUR) 30 MG 24 hr tablet Take 15 mg by mouth every evening.      Marland Kitchen levothyroxine (SYNTHROID, LEVOTHROID) 100 MCG tablet Take 100 mcg by mouth daily before breakfast.      . loratadine (CLARITIN) 10 MG tablet Take 10 mg by mouth 2 (two) times daily.      . mometasone (NASONEX) 50 MCG/ACT nasal spray Place 2 sprays into the nose daily as needed (allergies).       . nitroGLYCERIN (NITROSTAT) 0.4 MG SL tablet Place 0.4 mg under the tongue every 5 (five) minutes as needed.        . propranolol (INDERAL) 10 MG tablet Take 10 mg by mouth 3 (three) times daily as needed (tremors).      . rosuvastatin (CRESTOR) 20 MG tablet Take 10 mg by mouth every evening.      . sertraline (ZOLOFT) 25 MG tablet Take 25 mg by mouth every morning.       Facility-Administered Encounter Medications as of 09/05/2013  Medication Dose Route Frequency Provider Last Rate Last Dose  . cyanocobalamin ((VITAMIN B-12)) injection 1,000 mcg  1,000 mcg Intramuscular Q30 days Star Age, MD   1,000 mcg at 09/01/13 1524  :  Review of Systems:  Out of a complete 14 point review of systems, all are reviewed and negative with the exception of these symptoms as listed below:  Review of Systems   Constitutional: Positive for fatigue and unexpected weight change (gain).  HENT: Positive for hearing loss and rhinorrhea.   Eyes: Negative.   Respiratory: Negative.   Cardiovascular: Positive for leg swelling.  Gastrointestinal: Positive for constipation.  Endocrine: Positive for cold intolerance.  Genitourinary: Positive for difficulty urinating.  Musculoskeletal: Negative.   Skin: Negative.   Allergic/Immunologic: Negative.   Neurological: Positive for tremors and weakness.  Hematological: Bruises/bleeds easily.  Psychiatric/Behavioral: Positive for sleep disturbance (insomnia).    Objective:  Neurologic Exam  Physical Exam Physical Examination:   Filed Vitals:   09/05/13 1109  BP: 133/72  Pulse: 61  Temp: 97.5 F (36.4 C)    General Examination: The patient is a very pleasant 72 y.o. female in no acute distress. [name] [name] appears well-developed and well-nourished and well groomed. [name] [name] brought in [name] [name] 2 wheeled walker.   HEENT: Normocephalic, atraumatic, pupils are equal, round and reactive to light and accommodation. Extraocular tracking is good without limitation to gaze excursion or nystagmus noted. Saccadic breakdown of smooth pursuit with limitations to upgaze. Face is symmetric with mild facial masking and normal facial sensation. Speech shows mild voice tremor, but no dysarthria noted. There is mild hypophonia. There is a mild head, no-no type tremor. Neck is moderately rigid. There are no carotid bruits on auscultation. Head is tilted to the R. Oropharynx exam reveals: mild mouth dryness, adequate dental hygiene and mild airway crowding, due to narrow airway. Mallampati is class II.   Chest: Clear to auscultation without wheezing, rhonchi or crackles noted.  Heart: S1+S2+0, regular and normal without murmurs, rubs or gallops noted.   Abdomen: Soft, non-tender and non-distended with normal bowel sounds appreciated on auscultation.  Extremities: There is 1+ pitting edema  in the distal lower extremities bilaterally. [name] [name] wears compression hose bilaterally.  Skin: Warm and dry without trophic changes noted.   Musculoskeletal: exam reveals no obvious joint deformities, tenderness or joint swelling or erythema.   Neurologically:  Mental status: The patient  is awake, alert and oriented in all 4 spheres. [name] [name] memory, attention, language and knowledge are fairly well preserved. There is no aphasia, agnosia, apraxia or anomia. Speech is clear with normal prosody and enunciation. Thought process is linear. Mood is congruent and affect is normal.  Cranial nerves are as described above under HEENT exam. In addition, shoulder shrug is normal with equal shoulder height noted. Motor exam: [name] [name] has thin bulk, 4/5 global strength and tone is increased throughout, moderately There is no drift, no resting tremor or rebound. [name] [name] has a mild action tremor bilaterally. Reflexes are 2+ in the UEs and 3+ in the LEs. Fine motor skills are moderately impaired in the UEs and severely impaired in the LEs, with no significant lateralization.  Cerebellar testing shows no dysmetria or intention tremor on finger to nose testing. There is no truncal or gait ataxia.  Sensory exam is intact to light touch.  Gait, station and balance: [name] [name] stands with severe difficulty with multiple attempts and and needs mild assistance; [name] [name] has a tendency to fall backwards. No veering to one side is noted. [name] [name] is noted to mildly lean to the R and [name] [name] head is tilted to the R. Posture is moderately stooped and stance is wide based. [name] [name] uses [name] [name] walker and walks with [name] [name] walker with significant difficulty turning and turns in 6 small stutter steps with insecurity. [name] [name] is not able to perform tandem walk.     Assessment and Plan:   In summary, CASSIDI MODESITT is a very pleasant 72 year old female with a history of essential tremor affecting [name] [name] head and [name] [name] upper extremities. [name] [name] has a severe gait disorder, with frequent  recurrent falls, as well as signs of parkinsonism. 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 They understand that there is no specific medication and that the course is invariably progressive. [name] [name] is encouraged to use [name] [name] walker at all times. [name] [name] may get to a point where [name] [name] needs to use a motorized wheelchair. [name] [name] is advised to drink plenty of fluids. [name] [name] is advised to continue staying active mentally. [name] [name] is furthermore advised to go through outpatient PT again. I suggested a followup in 4 months from now, sooner if the need arises. They are encouraged to call with any interim questions, concerns or problems. They were in agreement. [name] [name] will be seen at the [facility] back in one year, June 2015. They declined my offer to send [name] [name] for a second opinion at [facility]. [name] [name] has tried and failed Sinemet.  Most of my 40 minute visit today was spent in counseling and coordination of care, reviewing test results and reviewing medication.

## 2013-09-05 NOTE — Patient Instructions (Addendum)
I think overall you are doing fairly well but I do want to suggest a few things today:  Remember to drink plenty of fluid, eat healthy meals and do not skip any meals. Try to eat protein with a every meal and eat a healthy snack such as fruit or nuts in between meals. Try to keep a regular sleep-wake schedule and try to exercise daily, particularly in the form of walking, 20-30 minutes a day, if you can.   Engage in social activities in your community and with your family and try to keep up with current events by reading the newspaper or watching the news.   As far as your medications are concerned, I would like to suggest no changes. We will do a round of OT and PT again.   As far as diagnostic testing: no new test.   I would like to see you back in 4 months, sooner if we need to. Please call us with any interim questions, concerns, problems, updates or refill requests.  Richardson Landry is my clinical assistant and will answer any of your questions and relay your messages to me and also relay most of my messages to you.  Our phone number is [phone]. We also have an after hours call service for urgent matters and there is a physician on-call for urgent questions. For any emergencies you know to call 911 or go to the nearest emergency room.

## 2013-09-12 ENCOUNTER — Ambulatory Visit: Payer: Medicare Other | Admitting: Neurology

## 2013-09-14 ENCOUNTER — Other Ambulatory Visit: Payer: Self-pay

## 2013-09-18 ENCOUNTER — Ambulatory Visit: Payer: Medicare Other | Attending: Neurology | Admitting: Physical Therapy

## 2013-09-18 DIAGNOSIS — Z5189 Encounter for other specified aftercare: Secondary | ICD-10-CM | POA: Insufficient documentation

## 2013-09-18 DIAGNOSIS — R262 Difficulty in walking, not elsewhere classified: Secondary | ICD-10-CM | POA: Insufficient documentation

## 2013-09-20 ENCOUNTER — Ambulatory Visit: Payer: Medicare Other | Admitting: Occupational Therapy

## 2013-09-27 ENCOUNTER — Ambulatory Visit: Payer: Medicare Other | Admitting: Physical Therapy

## 2013-09-27 ENCOUNTER — Ambulatory Visit: Payer: Medicare Other | Admitting: Occupational Therapy

## 2013-10-03 ENCOUNTER — Ambulatory Visit: Payer: Medicare Other | Admitting: Physical Therapy

## 2013-10-03 ENCOUNTER — Ambulatory Visit: Payer: Medicare Other | Admitting: Occupational Therapy

## 2013-10-06 ENCOUNTER — Other Ambulatory Visit: Payer: Self-pay | Admitting: Internal Medicine

## 2013-10-09 ENCOUNTER — Ambulatory Visit: Payer: Medicare Other | Attending: Neurology | Admitting: Physical Therapy

## 2013-10-09 ENCOUNTER — Ambulatory Visit: Payer: Medicare Other | Admitting: Occupational Therapy

## 2013-10-09 DIAGNOSIS — Z5189 Encounter for other specified aftercare: Secondary | ICD-10-CM | POA: Insufficient documentation

## 2013-10-09 DIAGNOSIS — R262 Difficulty in walking, not elsewhere classified: Secondary | ICD-10-CM | POA: Insufficient documentation

## 2013-10-12 ENCOUNTER — Ambulatory Visit: Payer: Medicare Other | Admitting: Physical Therapy

## 2013-10-12 ENCOUNTER — Ambulatory Visit: Payer: Medicare Other | Admitting: Occupational Therapy

## 2013-10-16 ENCOUNTER — Ambulatory Visit: Payer: Medicare Other | Admitting: Occupational Therapy

## 2013-10-16 ENCOUNTER — Ambulatory Visit: Payer: Medicare Other | Admitting: Physical Therapy

## 2013-10-17 ENCOUNTER — Telehealth: Payer: Self-pay | Admitting: Neurology

## 2013-10-18 ENCOUNTER — Ambulatory Visit: Payer: Medicare Other | Admitting: Physical Therapy

## 2013-10-18 ENCOUNTER — Other Ambulatory Visit: Payer: Self-pay | Admitting: Cardiovascular Disease

## 2013-10-18 ENCOUNTER — Ambulatory Visit: Payer: Medicare Other | Admitting: Occupational Therapy

## 2013-10-19 ENCOUNTER — Telehealth: Payer: Self-pay | Admitting: Neurology

## 2013-10-19 DIAGNOSIS — R269 Unspecified abnormalities of gait and mobility: Secondary | ICD-10-CM

## 2013-10-19 DIAGNOSIS — R296 Repeated falls: Secondary | ICD-10-CM

## 2013-10-19 DIAGNOSIS — G20 Parkinson's disease: Secondary | ICD-10-CM

## 2013-10-19 NOTE — Telephone Encounter (Signed)
Faxed information to [facility]

## 2013-10-19 NOTE — Telephone Encounter (Signed)
Please call pt and advise her that I have ordered a manual WC as requested by pt and as recommended by PT. Since I recently have seen her end of Oct., she does not have to see me for the [facility] to be ordered. Order placed we can fax to DME of her choice or [facility] if patient has not particular request for DME company. Please fax order, thx

## 2013-10-19 NOTE — Telephone Encounter (Signed)
Please advise 

## 2013-10-19 NOTE — Telephone Encounter (Signed)
Called patient concerning Dr. Rexene Alberts sending a order in for an manual WC. Per Dr. Rexene Alberts, an order has been generated and is being faxed over to [facility]. Patient verbalized understanding. I advised the patient that if she has any other problems, questions or concerns to call the office.

## 2013-10-19 NOTE — Telephone Encounter (Signed)
Barbaraann Rondo from [facility] calling to get patient demographics and height and weight for the manual wheelchair order. Please call.

## 2013-10-24 ENCOUNTER — Ambulatory Visit: Payer: Medicare Other | Admitting: Physical Therapy

## 2013-10-24 ENCOUNTER — Ambulatory Visit: Payer: Medicare Other | Admitting: Occupational Therapy

## 2013-10-26 ENCOUNTER — Ambulatory Visit: Payer: Medicare Other | Admitting: Physical Therapy

## 2013-10-26 ENCOUNTER — Ambulatory Visit: Payer: Medicare Other | Admitting: Occupational Therapy

## 2013-10-30 ENCOUNTER — Ambulatory Visit: Payer: Medicare Other | Admitting: Occupational Therapy

## 2013-10-30 ENCOUNTER — Ambulatory Visit: Payer: Medicare Other | Admitting: Physical Therapy

## 2013-10-31 ENCOUNTER — Ambulatory Visit: Payer: Medicare Other | Admitting: Neurology

## 2013-11-06 ENCOUNTER — Ambulatory Visit: Payer: Medicare Other | Admitting: Physical Therapy

## 2013-11-06 ENCOUNTER — Ambulatory Visit: Payer: Medicare Other | Admitting: Occupational Therapy

## 2013-11-07 ENCOUNTER — Telehealth: Payer: Self-pay | Admitting: Neurology

## 2013-11-07 DIAGNOSIS — R269 Unspecified abnormalities of gait and mobility: Secondary | ICD-10-CM

## 2013-11-07 DIAGNOSIS — G20 Parkinson's disease: Secondary | ICD-10-CM

## 2013-11-07 NOTE — Telephone Encounter (Signed)
Patient called to state that the wheelchair they had delivered to her does not fit her, it is too high. Patient states that a new order for hemi lightweight wheelchair needs to be sent over to [facility]. They can be contacted at [phone]. Patient is hoping to get the wheelchair ordered today so that when they come to pick up the old one they can deliver the new one.

## 2013-11-08 ENCOUNTER — Ambulatory Visit: Payer: Medicare Other | Admitting: Physical Therapy

## 2013-11-13 ENCOUNTER — Ambulatory Visit: Payer: Medicare Other | Attending: Neurology | Admitting: Physical Therapy

## 2013-11-13 ENCOUNTER — Ambulatory Visit: Payer: Medicare Other | Admitting: Occupational Therapy

## 2013-11-13 DIAGNOSIS — Z5189 Encounter for other specified aftercare: Secondary | ICD-10-CM | POA: Insufficient documentation

## 2013-11-13 DIAGNOSIS — R262 Difficulty in walking, not elsewhere classified: Secondary | ICD-10-CM | POA: Insufficient documentation

## 2013-11-13 NOTE — Telephone Encounter (Signed)
Informed patient that a new referral will be sent over to [facility], she verbalized understanding

## 2013-11-13 NOTE — Telephone Encounter (Signed)
Please fax to new wheelchair order to [facility].

## 2013-11-13 NOTE — Telephone Encounter (Signed)
Patient called to say that they sent the wrong wheelchair order, patient does NOT need Hemi, patient needs Breezy wheelchair order faxed to [facility].

## 2013-11-13 NOTE — Telephone Encounter (Signed)
Spoke with referral team at [facility], stated that they need a new referral faxed [phone])  for a Hemi lightweight wheel chair.

## 2013-11-16 ENCOUNTER — Ambulatory Visit: Payer: Medicare Other | Admitting: Occupational Therapy

## 2013-11-16 ENCOUNTER — Ambulatory Visit: Payer: Medicare Other | Admitting: Physical Therapy

## 2013-11-21 ENCOUNTER — Ambulatory Visit: Payer: Medicare Other | Admitting: Internal Medicine

## 2013-11-21 ENCOUNTER — Ambulatory Visit: Payer: Medicare Other | Admitting: Physical Therapy

## 2013-11-23 ENCOUNTER — Ambulatory Visit: Payer: Medicare Other | Admitting: Occupational Therapy

## 2013-11-28 ENCOUNTER — Ambulatory Visit: Payer: Medicare Other | Admitting: Physical Therapy

## 2013-11-30 ENCOUNTER — Telehealth: Payer: Self-pay | Admitting: Gastroenterology

## 2013-11-30 NOTE — Telephone Encounter (Signed)
Patient states she never received the stool cards after her procedure. Mailed out new cards.

## 2013-12-01 ENCOUNTER — Ambulatory Visit: Payer: Medicare Other | Admitting: Occupational Therapy

## 2013-12-05 ENCOUNTER — Ambulatory Visit (INDEPENDENT_AMBULATORY_CARE_PROVIDER_SITE_OTHER): Payer: Medicare Other | Admitting: Cardiovascular Disease

## 2013-12-05 ENCOUNTER — Ambulatory Visit: Payer: Medicare Other | Admitting: Physical Therapy

## 2013-12-05 ENCOUNTER — Encounter: Payer: Self-pay | Admitting: Cardiovascular Disease

## 2013-12-05 VITALS — BP 120/66 | HR 66 | Ht 63.0 in | Wt 143.0 lb

## 2013-12-05 DIAGNOSIS — E785 Hyperlipidemia, unspecified: Secondary | ICD-10-CM

## 2013-12-05 DIAGNOSIS — I2510 Atherosclerotic heart disease of native coronary artery without angina pectoris: Secondary | ICD-10-CM

## 2013-12-05 MED ORDER — ROSUVASTATIN CALCIUM 20 MG PO TABS: 10.0000 mg | ORAL_TABLET | Freq: Every evening | ORAL | Status: DC

## 2013-12-05 NOTE — Patient Instructions (Signed)
Your physician recommends that you continue on your current medications as directed. Please refer to the Current Medication list given to you today.  Your physician wants you to follow-up in: 6 months with Dr. Burt Knack.  You will receive a reminder letter in the mail two months in advance. If you don't receive a letter, please call our office to schedule the follow-up appointment.

## 2013-12-06 ENCOUNTER — Encounter: Payer: Self-pay | Admitting: Cardiovascular Disease

## 2013-12-06 NOTE — Progress Notes (Signed)
HPI:  73 year old woman presenting for followup evaluation. She is followed for coronary artery disease. 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  Her neurologic symptoms have been progressive over the last 3 years and she anticipates progressive decline. She denies cardiovascular symptoms. She specifically denies chest pain or shortness of breath. She does admit to some leg edema, but is in a chair almost all of the time.  Outpatient Encounter Prescriptions as of 12/05/2013  Medication Sig  . AFLURIA PRESERVATIVE FREE injection   . albuterol (PROAIR HFA) 108 (90 BASE) MCG/ACT inhaler Inhale 2 puffs into the lungs every 6 (six) hours as needed for wheezing or shortness of breath.   . beclomethasone (QVAR) 80 MCG/ACT inhaler Inhale 1 puff into the lungs daily as needed (wheezing).   . Calcium Carbonate-Vitamin D (CALCIUM-VITAMIN D) 500-200 MG-UNIT per tablet Take 1 tablet by mouth daily.    . clonazePAM (KLONOPIN) 0.5 MG tablet Take 0.5 mg by mouth 2 (two) times daily as needed (tremors).  . CRESTOR 20 MG tablet TAKE 1/2 TABLET BY MOUTH DAILY  . cyanocobalamin (,VITAMIN B-12,) 1000 MCG/ML injection Inject 1,000 mcg into the muscle every 30 (thirty) days.    . famotidine (PEPCID) 40 MG tablet Take 40 mg by mouth 2 (two) times daily as needed for heartburn.  . fluocinolone (VANOS) 0.01 % cream Apply 1 application topically 2 (two) times daily as needed (itching).   . furosemide (LASIX) 20 MG tablet Take 20 mg by mouth daily as needed for fluid or edema. For severe swelling with discomfort.  . hydrOXYzine (ATARAX/VISTARIL) 10 MG tablet Take 10 mg by mouth 3 (three) times daily as needed for itching.  . isosorbide mononitrate (IMDUR) 30 MG 24 hr tablet Take 15 mg by mouth every evening.  Marland Kitchen levothyroxine (SYNTHROID, LEVOTHROID) 100 MCG tablet Take 100 mcg by mouth daily before breakfast.  . loratadine (CLARITIN) 10 MG tablet Take 10 mg  by mouth 2 (two) times daily.  . mometasone (NASONEX) 50 MCG/ACT nasal spray Place 2 sprays into the nose daily as needed (allergies).   . nitroGLYCERIN (NITROSTAT) 0.4 MG SL tablet Place 0.4 mg under the tongue every 5 (five) minutes as needed.    . propranolol (INDERAL) 10 MG tablet Take 10 mg by mouth 3 (three) times daily as needed (tremors).  . rosuvastatin (CRESTOR) 20 MG tablet Take 0.5 tablets (10 mg total) by mouth every evening.  . sertraline (ZOLOFT) 25 MG tablet Take 25 mg by mouth every morning.  Marland Kitchen SYNTHROID 100 MCG tablet TAKE 1 TABLET BY MOUTH DAILY.  . [DISCONTINUED] rosuvastatin (CRESTOR) 20 MG tablet Take 10 mg by mouth every evening.    No Known Allergies  Past Medical History  Diagnosis Date  . GERD (gastroesophageal reflux disease)   . Familial tremor     followed by Dr. Erling Cruz  . Unspecified chronic bronchitis   . Vaginitis   . Irritable bladder   . Pneumonia   . Hypothyroidism   . Depression   . Asthma   . CAD (coronary artery disease)     myoview 5/08:  EF 76%, no scar, no ischemia, +ECG changes with exercise;  cath 04/08/07:  pLAD 30%, mLAD 60-70% - med tx.  Marland Kitchen HLD (hyperlipidemia)   . 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 Coronary artery disease, moderate nonobstructive, managed medically. The patient is having no anginal symptoms. We'll continue her current medical program. She does not tolerate aspirin.  2.  Hyperlipidemia. She's managed with low-dose Crestor. Lipids 07/07/13 show cholesterol 135, trig 69, HDL 63, and LDL 58.  For followup I will see her back in 6 months. No med changes made today.  [name] 12/06/2013 10:19 PM

## 2013-12-07 ENCOUNTER — Ambulatory Visit: Payer: Medicare Other | Admitting: Occupational Therapy

## 2013-12-07 ENCOUNTER — Ambulatory Visit: Payer: Medicare Other | Admitting: Physical Therapy

## 2013-12-08 ENCOUNTER — Ambulatory Visit: Payer: Medicare Other | Admitting: Occupational Therapy

## 2013-12-12 ENCOUNTER — Ambulatory Visit: Payer: Medicare Other | Attending: Neurology | Admitting: Occupational Therapy

## 2013-12-12 ENCOUNTER — Ambulatory Visit: Payer: Medicare Other | Admitting: Physical Therapy

## 2013-12-12 DIAGNOSIS — R262 Difficulty in walking, not elsewhere classified: Secondary | ICD-10-CM | POA: Insufficient documentation

## 2013-12-12 DIAGNOSIS — Z5189 Encounter for other specified aftercare: Secondary | ICD-10-CM | POA: Insufficient documentation

## 2013-12-13 ENCOUNTER — Encounter: Payer: Self-pay | Admitting: Internal Medicine

## 2013-12-13 ENCOUNTER — Ambulatory Visit (INDEPENDENT_AMBULATORY_CARE_PROVIDER_SITE_OTHER): Payer: Medicare Other | Admitting: Internal Medicine

## 2013-12-13 ENCOUNTER — Other Ambulatory Visit: Payer: Self-pay | Admitting: Internal Medicine

## 2013-12-13 VITALS — BP 120/62 | HR 63 | Temp 97.0°F

## 2013-12-13 DIAGNOSIS — G20A1 Parkinson's disease without dyskinesia, without mention of fluctuations: Secondary | ICD-10-CM

## 2013-12-13 DIAGNOSIS — F3289 Other specified depressive episodes: Secondary | ICD-10-CM

## 2013-12-13 DIAGNOSIS — G20 Parkinson's disease: Secondary | ICD-10-CM

## 2013-12-13 DIAGNOSIS — F329 Major depressive disorder, single episode, unspecified: Secondary | ICD-10-CM

## 2013-12-13 DIAGNOSIS — I2510 Atherosclerotic heart disease of native coronary artery without angina pectoris: Secondary | ICD-10-CM

## 2013-12-13 DIAGNOSIS — J45909 Unspecified asthma, uncomplicated: Secondary | ICD-10-CM

## 2013-12-13 DIAGNOSIS — G250 Essential tremor: Secondary | ICD-10-CM

## 2013-12-13 DIAGNOSIS — E039 Hypothyroidism, unspecified: Secondary | ICD-10-CM

## 2013-12-13 DIAGNOSIS — Z0000 Encounter for general adult medical examination without abnormal findings: Secondary | ICD-10-CM

## 2013-12-13 DIAGNOSIS — Z23 Encounter for immunization: Secondary | ICD-10-CM

## 2013-12-13 DIAGNOSIS — G252 Other specified forms of tremor: Secondary | ICD-10-CM

## 2013-12-13 NOTE — Progress Notes (Signed)
Pre visit review using our clinic review tool, if applicable. No additional management support is needed unless otherwise documented below in the visit note. 

## 2013-12-13 NOTE — Progress Notes (Signed)
Subjective:    Patient ID: [name] [name], female    DOB: 1940/12/28, 73 y.o.   MRN: [id number]  HPI The patient is here for annual Medicare wellness examination and management of other chronic and acute problems.   The risk factors are reflected in the social history.  The roster of all physicians providing medical care to patient - is listed in the Snapshot section of the chart.  Activities of daily living:  The patient is dependent in all ADLs: dressing-may need help with dressing, toileting-independent, feeding - independent, dependent in  Mobility using walker and w/c.   Home safety : The patient has smoke detectors in the home. They wear seatbelts There is no risks for hepatitis, STDs or HIV.   There is no   history of blood transfusion. They have no travel history to infectious disease endemic areas of the world.  The patient has not seen their dentist in the last six month. They have not seen their eye doctor in the last year. They deny any change in hearing, wears aids, and have  had audiologic testing in the last year.    They do not  have excessive sun exposure. Discussed the need for sun protection: hats, long sleeves and use of sunscreen if there is significant sun exposure.   Diet: the importance of a healthy diet is discussed. They do have a healthy diet.  The patient has a regular exercise program: does PT exercises as instructed.  The benefits of regular aerobic exercise were discussed.  Depression screen: there are no signs or vegative symptoms of depression- irritability, change in appetite, anhedonia, sadness/tearfullness.  Cognitive assessment: the patient manages all their financial and personal affairs and is actively engaged.   The following portions of the patient's history were reviewed and updated as appropriate: allergies, current medications, past family history, past medical history,  past surgical history, past social history  and problem  list.  Vision, hearing, body mass index were assessed and reviewed.   During the course of the visit the patient was educated and counseled about appropriate screening and preventive services including : fall prevention , diabetes screening, nutrition counseling, colorectal cancer screening, and recommended immunizations. Past Medical History  Diagnosis Date  . GERD (gastroesophageal reflux disease)   . Familial tremor     followed by Dr. Erling Cruz  . Unspecified chronic bronchitis   . Vaginitis   . Irritable bladder   . Pneumonia   . Hypothyroidism   . Depression   . Asthma   . CAD (coronary artery disease)     myoview 5/08:  EF 76%, no scar, no ischemia, +ECG changes with exercise;  cath 04/08/07:  pLAD 30%, mLAD 60-70% - med tx.  Marland Kitchen HLD (hyperlipidemia)   . 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 Laparoscopic nissen fundoplication    . Cystectomy    . Tumor removal    . Tubal ligation    . Colonoscopy N/A 08/29/2013    Procedure: COLONOSCOPY;  Surgeon: Inda Castle, MD;  Location: [facility];  Service: Endoscopy;  Laterality: N/A;   Family History  Problem Relation Age of Onset  . Coronary artery disease    . Heart attack    . Hypertension    . Diabetes    . Breast cancer    . Hypertension Mother   . Heart disease Father  History   Social History  . Marital Status: Married    Spouse Name: N/A    Number of Children: N/A  . Years of Education: N/A   Occupational History  . Not on file.   Social History Main Topics  . Smoking status: Never Smoker   . Smokeless tobacco: Never Used  . Alcohol Use: No  . Drug Use: No  . Sexual Activity: Not Currently   Other Topics Concern  . Not on file   Social History Narrative   HSG, retired from [employer] . married '60. 1 son - '65; 1 daughter - '61; 2 grandchildren; 1 great-grandchild. Homemaker. marriage in good health. 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 Positive history of passive tobacco smoke exposure.    Current Outpatient Prescriptions on File Prior to Visit  Medication Sig Dispense Refill  . AFLURIA PRESERVATIVE FREE injection       . albuterol (PROAIR HFA) 108 (90 BASE) MCG/ACT inhaler Inhale 2 puffs into the lungs every 6 (six) hours as needed for wheezing or shortness of breath.       . beclomethasone (QVAR) 80 MCG/ACT inhaler Inhale 1 puff into the lungs daily as needed (wheezing).       . Calcium Carbonate-Vitamin D (CALCIUM-VITAMIN D) 500-200 MG-UNIT per tablet Take 1 tablet by mouth daily.        . cyanocobalamin (,VITAMIN B-12,) 1000 MCG/ML injection Inject 1,000 mcg into the muscle every 30 (thirty) days.        . famotidine (PEPCID) 40 MG tablet Take 40 mg by mouth 2 (two) times daily as needed for heartburn.      . fluocinolone (VANOS) 0.01 % cream Apply 1 application topically 2 (two) times daily as needed (itching).       . furosemide (LASIX) 20 MG tablet Take 20 mg by mouth daily as needed for fluid or edema. For severe swelling with discomfort.      . hydrOXYzine (ATARAX/VISTARIL) 10 MG tablet Take 10 mg by mouth 3 (three) times daily as needed for itching.      . isosorbide mononitrate (IMDUR) 30 MG 24 hr tablet Take 15 mg by mouth every evening.      . loratadine (CLARITIN) 10 MG tablet Take 10 mg by mouth 2 (two) times daily.      . mometasone (NASONEX) 50 MCG/ACT nasal spray Place 2 sprays into the nose daily as needed (allergies).       . nitroGLYCERIN (NITROSTAT) 0.4 MG SL tablet Place 0.4 mg under the tongue every 5 (five) minutes as needed.        . propranolol (INDERAL) 10 MG tablet Take 10 mg by mouth 3 (three) times daily as needed (tremors).      . rosuvastatin (CRESTOR) 20 MG tablet Take 0.5 tablets (10 mg total) by mouth every evening.  45 tablet  3  . sertraline (ZOLOFT) 25 MG tablet Take 25 mg by mouth every morning.      Marland Kitchen SYNTHROID 100 MCG tablet TAKE 1 TABLET BY MOUTH DAILY.  30  tablet  6   No current facility-administered medications on file prior to visit.      Review of Systems Constitutional:  Negative for fever, chills, activity change and unexpected weight change.  HEENT:  Negative for hearing loss, ear pain, congestion, neck stiffness and postnasal drip. Negative for sore throat or swallowing problems. Negative for dental complaints.   Eyes: Negative for vision loss or change in visual acuity.  Respiratory: Negative for chest  tightness and wheezing. Negative for DOE.   Cardiovascular: Negative for chest pain or palpitations. No decreased exercise tolerance Gastrointestinal: No change in bowel habit. No bloating or gas. No reflux or indigestion Genitourinary: Negative for urgency, frequency, flank pain and difficulty urinating.  Musculoskeletal: Negative for myalgias, back pain, arthralgias and gait problem.  Neurological: Negative for dizziness, tremors, weakness and headaches.  Hematological: Negative for adenopathy.  Psychiatric/Behavioral: Negative for behavioral problems and dysphoric mood.       Objective:   Physical Exam Filed Vitals:   12/13/13 1433  BP: 120/62  Pulse: 63  Temp: 97 F (36.1 C)   Wt Readings from Last 3 Encounters:  12/05/13 143 lb (64.864 kg)  09/05/13 146 lb (66.225 kg)  08/29/13 141 lb 8.6 oz (64.2 kg)   BP Readings from Last 3 Encounters:  12/13/13 120/62  12/05/13 120/66  09/05/13 133/72   Gen'l WNWD nicely groomed woman, in a w/c HEENT- C&S, PERRLA Cor- 2+ radial pulse, RRR w/o mm/r/g PUlm - normal respirations, Lungst CTAP Neuro - familial tremor, PERRLA, moves UE normally Derm - 2-3 mm raised coarse lesion lateral aspect right distal LE         Assessment & Plan:

## 2013-12-13 NOTE — Patient Instructions (Signed)
Thanks for coming in and thank you for allowing me to be your doctor the last 23 years. 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  The left palm has a very early dupytren's contracture - not a real problem now but can get worse and you may at some time need a hand surgeon.  Your are current with colorectal cancer screening; go ahead and get a mammogram; immunizations are current and we will give Prevnar pneumonia vaccine today. You need to see an eye doctor and you need to see your dentist.  Heart, lungs - normal exam.  Labs are current and should be repeated at the end of August.

## 2013-12-14 ENCOUNTER — Other Ambulatory Visit: Payer: Self-pay

## 2013-12-14 ENCOUNTER — Ambulatory Visit: Payer: Medicare Other | Admitting: Occupational Therapy

## 2013-12-14 ENCOUNTER — Telehealth: Payer: Self-pay | Admitting: Gastroenterology

## 2013-12-14 ENCOUNTER — Ambulatory Visit: Payer: Medicare Other | Admitting: Physical Therapy

## 2013-12-14 DIAGNOSIS — Z1231 Encounter for screening mammogram for malignant neoplasm of breast: Secondary | ICD-10-CM

## 2013-12-14 NOTE — Telephone Encounter (Signed)
Pt called for stool card results. Let pt know that the results are not back yet, will call pt when results are available.

## 2013-12-14 NOTE — Assessment & Plan Note (Signed)
Slowly progressive disorder. She has been to [facility] clinic recently and she will continue to follow with local neurologist - had been seeing Dr. Erling Cruz until his retirement.

## 2013-12-14 NOTE — Assessment & Plan Note (Signed)
Interval history has been unremarkable with no exacerbations. She is not using her rescue inhaler more than twice a week.

## 2013-12-14 NOTE — Assessment & Plan Note (Signed)
No change in condition.

## 2013-12-14 NOTE — Assessment & Plan Note (Signed)
Stable in the interval with no chest pain or other cardiac symptoms.

## 2013-12-17 NOTE — Assessment & Plan Note (Signed)
Interval history is w/o major change in her condition. She was at [facility] in the last several months and will return in the spring. She has had no injury. Limited physical exam is unchanged. Previous lab Aug '14 and Oct '14 reviewed: normal cholesterol levels, blood sugar and renal functions. No indication for repeat labs at this time. She is current with colorectal and beast cancer screening. Immunizations are up to date and Prevnar is for today.  In summary  A very courageous woman managing her health problems and the needs of mother and daughter with relative equanimity.

## 2013-12-17 NOTE — Assessment & Plan Note (Signed)
Given her burdens (very elderly mother, daughter with severe medical problems, her own personal health issues) she is doing ok.   Plan Continue low dose sertraline

## 2013-12-18 ENCOUNTER — Ambulatory Visit: Payer: Medicare Other | Admitting: Physical Therapy

## 2013-12-18 ENCOUNTER — Encounter: Payer: Medicare Other | Admitting: Occupational Therapy

## 2013-12-20 ENCOUNTER — Telehealth: Payer: Self-pay | Admitting: Gastroenterology

## 2013-12-20 ENCOUNTER — Ambulatory Visit: Payer: Medicare Other | Admitting: Physical Therapy

## 2013-12-20 NOTE — Telephone Encounter (Signed)
Pt calling for stool card results. Called the lab and they state they have not received the cards from the mail. Pt states she mailed them a week and a half ago. Pt requested another set of cards be mailed to her. Cards mailed out today.

## 2013-12-21 ENCOUNTER — Ambulatory Visit (INDEPENDENT_AMBULATORY_CARE_PROVIDER_SITE_OTHER): Payer: Medicare Other | Admitting: Internal Medicine

## 2013-12-21 ENCOUNTER — Telehealth: Payer: Self-pay

## 2013-12-21 ENCOUNTER — Encounter: Payer: Medicare Other | Admitting: Occupational Therapy

## 2013-12-21 ENCOUNTER — Ambulatory Visit: Payer: Medicare Other | Admitting: Physical Therapy

## 2013-12-21 DIAGNOSIS — R0789 Other chest pain: Secondary | ICD-10-CM

## 2013-12-21 DIAGNOSIS — R071 Chest pain on breathing: Secondary | ICD-10-CM

## 2013-12-21 MED ORDER — CLONAZEPAM 0.5 MG PO TABS: ORAL_TABLET | ORAL | Status: DC

## 2013-12-21 NOTE — Telephone Encounter (Signed)
Please schedule patient this evening per Dr Linda Hedges note. Thanks

## 2013-12-21 NOTE — Telephone Encounter (Signed)
Dr. Tamala Julian is off this afternoon.  She is scheduled for 5:30 today with Dr. Linda Hedges.

## 2013-12-21 NOTE — Telephone Encounter (Signed)
Phone call from patient [phone] states she fell 2 days ago and hurt her back. Feels worse. She wants to know should she come in to see you or the ortho doctor? Please advise.

## 2013-12-21 NOTE — Telephone Encounter (Signed)
She should come here. Dr. Tamala Julian would be great if he has any openings this PM. If not I will stay late to see her - come at 5: 4

## 2013-12-21 NOTE — Patient Instructions (Signed)
Chest wall trauma - your fall was blunt trauma to the right chest wall. On exam there is not the degree of tenderness that would suggest a displaced rib fracture, thus no need for x-rays. What is important is to avoid splinting (shallow inspirations due to pain) that can lead to atelectasis (incomplete expansion of the lung-alveoli) that can increase the risk for pneumonia  Plan 5 minutes of every hour you are awake take slow deep breaths. Hold a pillow across your chest to minimize discomfort.  Hold your chest, arms across your chest, when you cough or sneeze.  Heat and rub of choice, e.g. Aspercreme, icy-hot, etc, will help with the pain.  Atelectasis, Adult Atelectasis is a collapse of the small air sacs in the lungs (alveoli). When this occurs, all or part of a lung collapses and becomes airless. It can be caused by various things and is a common problem after surgery. The severity of atelectasis will vary depending on the size of the area involved and the underlying cause of the condition. CAUSES  There are multiple causes for atelectasis:   Shallow breathing, particularly if there is an injury to your chest wall or abdomen that makes it painful to take a deep breath. This commonly occurs after surgery.  Obstruction of your airways (bronchi or bronchioles). This may be caused by a buildup of mucus (mucus plug), tumors, blood clots (pulmonary embolus), or inhaled foreign bodies. Mucus plugs occur when the lungs do not expand enough to get rid of mucus.  Outside pressure on the lung. This may be caused by tumors, fluid (pleural effusion), or a leakage of air between the lung and rib cage (pneumothorax).   Infections such as pneumonia.  Scarring in lung tissue left over from previous infection or injury.  Some diseases such as cystic fibrosis. SIGNS AND SYMPTOMS  Often, atelectasis will have no symptoms. When symptoms occur, they include:  Shortness of breath.   Bluish color to your  nails, lips, or mouth (cyanosis). DIAGNOSIS  Your health care provider may suspect atelectasis based on symptoms and physical findings. A chest X-ray may be done to confirm the diagnosis. More specialized X-ray exams are sometimes required.  TREATMENT  Treatment will depend on the cause of the atelectasis. Treatment may include:  Purposeful coughing to loosen mucus plugs in the lungs.  Chest physiotherapy. This consists of clapping or percussion on the chest over the lungs to further loosen mucus plugs.  Postural drainage techniques. This involves positioning your body so your head is lower than your chest. [place] relaxed deep breathing whenever you are sitting down. A good technique is to take a few relaxed deep breaths each time a commercial comes on if you are watching television.  If you were given a deep breathing device (such as an incentive spirometer) or a mucus clearance device, use this regularly as directed by your health care provider.  Try to cough several times a day as directed by your health care provider.  Perform any chest physiotherapy or postural drainage techniques as directed by your health care provider. If necessary, have someone (such as a family member) assist you with these techniques.  When lying down, lie on the unaffected side to encourage mucus drainage.  Stay physically active as much as possible. SEEK IMMEDIATE MEDICAL CARE IF:   You develop increasing problems with your breathing.   You develop severe chest pain.   You develop severe coughing, or you cough up blood.  You have a fever or persistent symptoms for more than 2 3 days.   You have a fever and your symptoms suddenly get worse.  Document Released: 10/26/2005 Document Revised: 06/28/2013 Document Reviewed: 05/03/2013 [facility] Patient Information 2014 [place].

## 2013-12-24 NOTE — Progress Notes (Signed)
Subjective:    Patient ID: [name] [name], female    DOB: 03/08/1941, 73 y.o.   MRN: [id number]  HPI [name] [name] fell at home, lost her balance w/o LOC, and struck her right chest against the door jam. She has had significant discomfort, worse with deep inspiration or movement. She has not had any increased SOB, no hemoptysis and has no bruising.  PMH, FamHx and SocHx reviewed for any changes and relevance.  Current Outpatient Prescriptions on File Prior to Visit  Medication Sig Dispense Refill  . AFLURIA PRESERVATIVE FREE injection       . albuterol (PROAIR HFA) 108 (90 BASE) MCG/ACT inhaler Inhale 2 puffs into the lungs every 6 (six) hours as needed for wheezing or shortness of breath.       . beclomethasone (QVAR) 80 MCG/ACT inhaler Inhale 1 puff into the lungs daily as needed (wheezing).       . Calcium Carbonate-Vitamin D (CALCIUM-VITAMIN D) 500-200 MG-UNIT per tablet Take 1 tablet by mouth daily.        . cyanocobalamin (,VITAMIN B-12,) 1000 MCG/ML injection Inject 1,000 mcg into the muscle every 30 (thirty) days.        . famotidine (PEPCID) 40 MG tablet Take 40 mg by mouth 2 (two) times daily as needed for heartburn.      . fluocinolone (VANOS) 0.01 % cream Apply 1 application topically 2 (two) times daily as needed (itching).       . furosemide (LASIX) 20 MG tablet Take 20 mg by mouth daily as needed for fluid or edema. For severe swelling with discomfort.      . hydrOXYzine (ATARAX/VISTARIL) 10 MG tablet Take 10 mg by mouth 3 (three) times daily as needed for itching.      . isosorbide mononitrate (IMDUR) 30 MG 24 hr tablet Take 15 mg by mouth every evening.      . loratadine (CLARITIN) 10 MG tablet Take 10 mg by mouth 2 (two) times daily.      . mometasone (NASONEX) 50 MCG/ACT nasal spray Place 2 sprays into the nose daily as needed (allergies).       . nitroGLYCERIN (NITROSTAT) 0.4 MG SL tablet Place 0.4 mg under the tongue every 5 (five) minutes as needed.        . propranolol  (INDERAL) 10 MG tablet Take 10 mg by mouth 3 (three) times daily as needed (tremors).      . rosuvastatin (CRESTOR) 20 MG tablet Take 0.5 tablets (10 mg total) by mouth every evening.  45 tablet  3  . sertraline (ZOLOFT) 25 MG tablet Take 25 mg by mouth every morning.      Marland Kitchen SYNTHROID 100 MCG tablet TAKE 1 TABLET BY MOUTH DAILY.  30 tablet  6   No current facility-administered medications on file prior to visit.      Review of Systems System review is negative for any constitutional, cardiac, pulmonary, GI or neuro symptoms or complaints other than as described in the HPI.     Objective:   Physical Exam There were no vitals filed for this visit. Gen'l- older woman ambulating w/o assistance using a rolling walker. HEENT- no Battle's sign, no sign of trauma Cor- 2+ radial pulse Pulm - shallow inspirations (splinting), no rales or wheezes Chest wall - no deformity, no palpable abnormality, minimal tenderness with AP and lateral compression of the chest wall.       Assessment & Plan:  Chest wall pain - exam does NOT  suggest displaced rib fracture but cannot rule out hairline fracture. Normal lung sounds  Plan Heat and rub of choice   Incentive spirometry exercise 5 min every hour while awake  Support chest with crossed arms or holding a pillow when coughing.  Call for any increase in pain or SOB.

## 2013-12-27 ENCOUNTER — Ambulatory Visit: Payer: Medicare Other | Admitting: Physical Therapy

## 2013-12-27 ENCOUNTER — Encounter: Payer: Medicare Other | Admitting: Occupational Therapy

## 2013-12-28 ENCOUNTER — Encounter: Payer: Medicare Other | Admitting: Occupational Therapy

## 2013-12-28 ENCOUNTER — Ambulatory Visit: Payer: Medicare Other | Admitting: Physical Therapy

## 2014-01-02 ENCOUNTER — Ambulatory Visit: Payer: Medicare Other

## 2014-01-03 ENCOUNTER — Other Ambulatory Visit: Payer: Medicare Other

## 2014-01-09 ENCOUNTER — Telehealth: Payer: Self-pay | Admitting: Gastroenterology

## 2014-01-09 NOTE — Telephone Encounter (Signed)
Pt states he husband dropped off stool cards and she is calling for the results. Called the lab and they state they cannot find the cards. Pts call transferred to the lab, this is the second set of cards she has completed and she getting frustrated.

## 2014-01-11 ENCOUNTER — Ambulatory Visit: Payer: Medicare Other | Admitting: Family Medicine

## 2014-01-16 ENCOUNTER — Ambulatory Visit: Payer: Medicare Other

## 2014-01-17 ENCOUNTER — Ambulatory Visit: Payer: Medicare Other | Admitting: Neurology

## 2014-01-17 ENCOUNTER — Telehealth: Payer: Self-pay | Admitting: Gastroenterology

## 2014-01-17 NOTE — Telephone Encounter (Signed)
Pt has completed 2 sets of stool cards. The first set was lost in the mail and returned to their house. The second set the pts husband hand delivered to the lab and the lab lost the cards. Lab states they do not know what happened to them. Do you still want the pt to complete another set of cards? Please advise. Pts colon was done in October.

## 2014-01-18 ENCOUNTER — Other Ambulatory Visit: Payer: Medicare Other

## 2014-01-18 ENCOUNTER — Other Ambulatory Visit: Payer: Self-pay

## 2014-01-18 DIAGNOSIS — K921 Melena: Secondary | ICD-10-CM

## 2014-01-18 LAB — HEMOCCULT SLIDES (X 3 CARDS)
FECAL OCCULT BLD: NEGATIVE
OCCULT 1: NEGATIVE
OCCULT 2: NEGATIVE
OCCULT 3: NEGATIVE
OCCULT 4: NEGATIVE
OCCULT 5: NEGATIVE

## 2014-01-18 NOTE — Telephone Encounter (Signed)
New sets mailed out today to pt. Pts husband instructed to ask for the lab manager Senaida Ores when they return the cards. He verbalized understanding.

## 2014-01-18 NOTE — Telephone Encounter (Signed)
Received call from [facility] in the lab and the cards have been found. Spoke with Senaida Ores and she states she will call the pt an let them know.

## 2014-01-18 NOTE — Telephone Encounter (Signed)
Yes, at no charge to the patient

## 2014-01-19 NOTE — Progress Notes (Signed)
Quick Note:  Please inform the patient that occult were negative. No further GI workup ______

## 2014-01-29 ENCOUNTER — Telehealth: Payer: Self-pay | Admitting: Neurology

## 2014-01-29 NOTE — Telephone Encounter (Signed)
Patient was scheduled an appointment with [name] [name] on 03/09/14 at 11:30 am.  Patient verbalized understanding.

## 2014-01-29 NOTE — Telephone Encounter (Signed)
Patient had appt scheduled on 3-11 w/Dr. Rexene Alberts and had to cancel appt--her daughter was in critical condition and did pass away-I tried to schedule her the next available appt which is in August and was going to  put her on a waiting list but she said she needed soon appt--patient wanted me to send message to nurse for sooner appt--thank you.

## 2014-01-30 ENCOUNTER — Ambulatory Visit: Payer: Medicare Other

## 2014-02-02 ENCOUNTER — Ambulatory Visit (INDEPENDENT_AMBULATORY_CARE_PROVIDER_SITE_OTHER): Payer: Medicare Other | Admitting: Neurology

## 2014-02-02 ENCOUNTER — Encounter: Payer: Self-pay | Admitting: Neurology

## 2014-02-02 ENCOUNTER — Ambulatory Visit (INDEPENDENT_AMBULATORY_CARE_PROVIDER_SITE_OTHER): Payer: Medicare Other | Admitting: *Deleted

## 2014-02-02 VITALS — BP 131/81 | HR 78 | Temp 96.7°F | Ht 63.0 in | Wt 153.0 lb

## 2014-02-02 DIAGNOSIS — R269 Unspecified abnormalities of gait and mobility: Secondary | ICD-10-CM

## 2014-02-02 DIAGNOSIS — G20 Parkinson's disease: Secondary | ICD-10-CM

## 2014-02-02 DIAGNOSIS — R296 Repeated falls: Secondary | ICD-10-CM

## 2014-02-02 DIAGNOSIS — K5900 Constipation, unspecified: Secondary | ICD-10-CM

## 2014-02-02 DIAGNOSIS — K5909 Other constipation: Secondary | ICD-10-CM

## 2014-02-02 DIAGNOSIS — E538 Deficiency of other specified B group vitamins: Secondary | ICD-10-CM

## 2014-02-02 DIAGNOSIS — Z9181 History of falling: Secondary | ICD-10-CM

## 2014-02-02 MED ORDER — CYANOCOBALAMIN 1000 MCG/ML IJ SOLN
1000.0000 ug | Freq: Once | INTRAMUSCULAR | Status: AC
  Administered 2014-02-02: 1000 ug via INTRAMUSCULAR

## 2014-02-02 NOTE — Progress Notes (Signed)
Pt here for B 12 injection.  Under aseptic technique cyanocobalamin 1022mg/1ml IM given R deltoid.  Tolerated well.  Bandaid applied.

## 2014-02-02 NOTE — Patient Instructions (Signed)
We will get you back in physical therapy.  We will try you back on Linzess 145 mg for chronic constipation. Take daily, 30 minutes before your first meal for the day. Take on and EMPTY stomach. Call us back next week, if you would like a prescription for it, if it works well for you.

## 2014-02-02 NOTE — Patient Instructions (Signed)
See next month.

## 2014-02-02 NOTE — Progress Notes (Signed)
Subjective:    Patient ID: [name] [name] is a 73 y.o. female.  HPI    Interim history:   [name] [name] is a very pleasant 73 year old right-handed woman with an underlying medical history of thyroid disease, [place] spotted fever at age 70, asthma, cardiac disease, essential tremor, hearing loss, hyperlipidemia, reflux disease with surgical repair, who presents for followup consultation of [name] [name] gait disorder, essential tremor, and recurrent falls, concern for MSA. She is accompanied by [name] [name] again today. I last saw [name] [name] on 09/05/2013, at which time she presented for a sooner than scheduled appointment due to recurrent falls. She had fallen 6 times within about 1-1/2 weeks' time. 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 I advised [name] [name] to use [name] [name] walker at all times. We talked about potentially going to a motorized wheelchair down the [place]. She was advised to drink more water. She had tried and failed Sinemet in the past. I suggested a second opinion at [facility] which they declined. I referred [name] [name] to physical and occupational therapy outpatient. In the interim, I prescribed a light weight wheelchair for [name] [name]. [name] [name] daughter recently died. She had advanced MS.  Today, she reports having had therapy for about 6 weeks, which helped. She has fallen a few times in the house, with bruising of [name] [name] ribs. She saw Dr. Linda Hedges. She had a colonoscopy on 08/29/13. He has hemorrhoids and suffers from chronic constipation. She has tried dulcolax, which works fairly well. She has tried Linzess, but had diarrhea and nausea, but may not have taken it correctly. She has an appointment at [facility] clinic in 5/15. She has had hip and knee pain. She has more freezing and trouble with turns. She has foot pain. [name] [name] R side is worse.   I first met [name] [name] on 05/10/2013, at which time we discussed the possibility of MSA. I  did not make any medication changes. We talked about the nature of progressive parkinsonism and gait disorder. She had [name] [name] last outpatient PT in July 2014. She tried to do stretching at home. She had a colonoscopy.  She previously followed with Dr. Morene Antu and was last seen by him on 12/30/2012, at which time he felt that she had worsening of [name] [name] gait. She also had symptoms of vertigo. She has been on baby aspirin, Synthroid, Crestor, and/or, clonazepam, propranolol, sertraline, trimethoprim, Nasonex, Provera, nitroglycerin as needed, vitamin B12.  She was diagnosed about 20 years ago with essential tremor. She had head tremor more than upper extremity tremor. She has a history of lung disease but was able to take low-dose propranolol. She has not tried primidone. She has had gait and balance problems in the last 5+ years approximately. She has to hold onto something. She has had bladder incontinence since [name] [name] 25s. She was on Crestor for 3 years which was discontinued in June 2011. CK was normal and EMG nerve conduction studies were normal in June 2011. Crestor was restarted. She has been on B12 injections without subjective improvement. MRI brain with and without contrast showed hyperostosis frontalis, MRI C-spine showed mild degenerative joint disease. 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 Lumbar spine MRI from July 2012 showed mild degenerative disc disease. 4 gait dysfunction she was referred to Dr. Gilford Rile with a question of myelopathy versus orthostatic tremor versus lower half parkinsonism versus essential tremor  with gait disorder. 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 She was seen at the [facility] in August 2013 and was felt to have a form of parkinsonism, possibly MSA with abnormal sweat testing. Further testing revealed negative tilt table test but there was mild  autonomic neuropathy per autonomic reflex screen. She has had swelling in both legs with negative Doppler of the legs and normal echocardiogram. Additional workup at [facility] included vitamin D, paraneoplastic antibodies, anti-GAD, all negative.  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 She has been using a 2 wheeled walker for the past 2 years.  She has been in PT and OT with improvement.  She endorsed a lot of stress, due primarily to [name] [name] mother's and daughter's health, who died in 02/02/14. Mother is in [name] [name] 25s and in a NH. She sleeps in a recliner, d/t pain in [name] [name] trunk, back, neck and stiffness and difficulty getting in and out of bed.   [name] [name] Past Medical History Is Significant For: Past Medical History  Diagnosis Date  . GERD (gastroesophageal reflux disease)   . Familial tremor     followed by Dr. Erling Cruz  . Unspecified chronic bronchitis   . Vaginitis   . Irritable bladder   . Pneumonia   . Hypothyroidism   . Depression   . Asthma   . CAD (coronary artery disease)     myoview 5/08:  EF 76%, no scar, no ischemia, +ECG changes with exercise;  cath 04/08/07:  pLAD 30%, mLAD 60-70% - med tx.  Marland Kitchen HLD (hyperlipidemia)   . 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 Laparoscopic nissen fundoplication    . Cystectomy    . Tumor removal    . Tubal ligation    . Colonoscopy N/A 08/29/2013    Procedure: COLONOSCOPY;  Surgeon: Inda Castle, MD;  Location: [facility];  Service: Endoscopy;  Laterality: N/A;    [name] [name] Family History Is Significant For: Family History  Problem Relation Age of Onset  . Coronary artery disease    . Heart attack    . Hypertension    . Diabetes    . Breast cancer    . Hypertension Mother   . Heart disease Father     [name] [name] Social History Is Significant For: History    Social History  . Marital Status: Married    Spouse Name: N/A    Number of Children: N/A  . Years of Education: N/A   Social History Main Topics  . Smoking status: Never Smoker   . Smokeless tobacco: Never Used  . Alcohol Use: No  . Drug Use: No  . Sexual Activity: Not Currently   Other Topics Concern  . None   Social History Narrative   HSG, retired from [employer] . married '60. 1 son - '65; 1 daughter - '61; 2 grandchildren; 1 great-grandchild. Homemaker. marriage in good health. primary care-giver for [name] [name] mother. Positive history of passive tobacco smoke exposure.    [name] [name] Allergies Are:  No Known Allergies:   [name] [name] Current Medications Are:  Outpatient Encounter Prescriptions as of 02/02/2014  Medication Sig  . AFLURIA PRESERVATIVE FREE injection   . albuterol (PROAIR HFA) 108 (90  BASE) MCG/ACT inhaler Inhale 2 puffs into the lungs every 6 (six) hours as needed for wheezing or shortness of breath.   . beclomethasone (QVAR) 80 MCG/ACT inhaler Inhale 1 puff into the lungs daily as needed (wheezing).   . Calcium Carbonate-Vitamin D (CALCIUM-VITAMIN D) 500-200 MG-UNIT per tablet Take 1 tablet by mouth daily.    . clonazePAM (KLONOPIN) 0.5 MG tablet TAKE 1-2 TABLETS BY MOUTH ONCE A DAY AS NEEDED  . cyanocobalamin (,VITAMIN B-12,) 1000 MCG/ML injection Inject 1,000 mcg into the muscle every 30 (thirty) days.    . famotidine (PEPCID) 40 MG tablet Take 40 mg by mouth 2 (two) times daily as needed for heartburn.  . fluocinolone (VANOS) 0.01 % cream Apply 1 application topically 2 (two) times daily as needed (itching).   . furosemide (LASIX) 20 MG tablet Take 20 mg by mouth daily as needed for fluid or edema. For severe swelling with discomfort.  . hydrOXYzine (ATARAX/VISTARIL) 10 MG tablet Take 10 mg by mouth 3 (three) times daily as needed for itching.  . isosorbide mononitrate (IMDUR) 30 MG 24 hr tablet Take 15 mg by mouth every evening.  . loratadine (CLARITIN) 10 MG tablet Take 10 mg by  mouth 2 (two) times daily.  . mometasone (NASONEX) 50 MCG/ACT nasal spray Place 2 sprays into the nose daily as needed (allergies).   . nitroGLYCERIN (NITROSTAT) 0.4 MG SL tablet Place 0.4 mg under the tongue every 5 (five) minutes as needed.    . propranolol (INDERAL) 10 MG tablet Take 10 mg by mouth 3 (three) times daily as needed (tremors).  . rosuvastatin (CRESTOR) 20 MG tablet Take 0.5 tablets (10 mg total) by mouth every evening.  . sertraline (ZOLOFT) 25 MG tablet Take 25 mg by mouth every morning.  Marland Kitchen SYNTHROID 100 MCG tablet TAKE 1 TABLET BY MOUTH DAILY.  :  Review of Systems:  Out of a complete 14 point review of systems, all are reviewed and negative with the exception of these symptoms as listed below:   Review of Systems  Constitutional: Positive for fatigue.  HENT: Positive for drooling and hearing loss.   Eyes: Negative.   Respiratory: Negative.   Cardiovascular: Positive for leg swelling.  Gastrointestinal: Positive for constipation.  Endocrine: Negative.   Genitourinary: Positive for urgency and frequency.  Musculoskeletal: Positive for gait problem and myalgias.  Skin: Negative.   Allergic/Immunologic: Negative.   Neurological: Positive for dizziness, tremors and weakness.  Hematological: Bruises/bleeds easily.  Psychiatric/Behavioral: Negative.     Objective:  Neurologic Exam  Physical Exam Physical Examination:   Filed Vitals:   02/02/14 1215  BP: 131/81  Pulse: 78  Temp: 96.7 F (35.9 C)    General Examination: The patient is a very pleasant 73 y.o. female in no acute distress. She appears well-developed and well-nourished and well groomed. She brought in [name] [name] 2 wheeled walker.   HEENT: Normocephalic, atraumatic, pupils are equal, round and reactive to light and accommodation. Extraocular tracking is good without limitation to gaze excursion or nystagmus noted. Saccadic breakdown of smooth pursuit with limitations to upgaze. Bilateral hearing are in  place. Face is symmetric with mild facial masking and normal facial sensation. Speech shows mild voice tremor, with minimal dysarthria noted. There is mild hypophonia. There is a mild head, no-no type tremor. Neck is moderately rigid. There are no carotid bruits on auscultation. Head is tilted to the R. Oropharynx exam reveals: mild mouth dryness, adequate dental hygiene and mild airway crowding, due to narrow airway.  Mallampati is class II.   Chest: Clear to auscultation without wheezing, rhonchi or crackles noted.  Heart: S1+S2+0, regular and normal without murmurs, rubs or gallops noted.   Abdomen: Soft, non-tender and non-distended with normal bowel sounds appreciated on auscultation.  Extremities: There is 1+ pitting edema in the distal lower extremities bilaterally.   Skin: Warm and dry without trophic changes noted. She has some smaller bruises on [name] [name] hands.   Musculoskeletal: exam reveals no obvious joint deformities, tenderness or joint swelling or erythema.   Neurologically:  Mental status: The patient is awake, alert and oriented in all 4 spheres. [name] [name] memory, attention, language and knowledge are fairly well preserved. There is no aphasia, agnosia, apraxia or anomia. Speech is clear with normal prosody and enunciation. Thought process is linear. Mood is congruent and affect is normal.  Cranial nerves are as described above under HEENT exam. In addition, shoulder shrug is normal with equal shoulder height noted. Motor exam: she has thin bulk, 4/5 global strength and tone is increased throughout, moderately, R>L. She has mild myoclonic jerking on the R with stimulus. There is no drift, no resting tremor or rebound. She has a mild action tremor bilaterally. Reflexes are 2+ in the UEs and 3+ in the LEs. Fine motor skills are moderately impaired in the UEs and severely impaired in the LEs, with no significant lateralization, perhaps a little worse on the R.  Cerebellar testing shows no  dysmetria or intention tremor on finger to nose testing. There is no truncal or gait ataxia.  Sensory exam is intact to light touch.  Gait, station and balance: she stands with severe difficulty with multiple attempts and and needs mild assistance; she has a tendency to fall backwards. No veering to one side is noted. She is noted to lean more to the R and [name] [name] head is tilted to the R. Posture is moderately stooped and stance is wide based. She uses [name] [name] walker and walks with [name] [name] walker with significant difficulty turning and turns in 9 small stutter steps with insecurity. She is not able to perform tandem walk. Balance is significantly impaired. She has 2-3 freezing episodes. She does not pick up [name] [name] R foot as well.      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 They understand that there is no specific medication and that the course is invariably progressive. In fact she has progressed in the last 4 months. She is encouraged to use [name] [name] walker at all times. She may get to a point where she needs to use a motorized wheelchair. She is advised to drink plenty of fluids. She is advised to stay active mentally. She is furthermore advised to go through outpatient PT again. I suggested a followup in 4 months from now, sooner if the need arises. She has had more issues with chronic constipation. I would like for [name] [name] to try Linzess again and provided [name] [name] with samples of the 145 mg capsules, 8 days supply. I did advise [name] [name] to take it at least half an are before [name] [name] first meal of the day on an empty stomach. Recently when she tried it she took it at night and suffered from diarrhea nausea. While she may still have side  effects I think she  may do better if she took it the way it is supposed to be taken. She has an appointment at the [facility] for [name] [name] yearly checkup in May of this year. She has tried and failed Sinemet and I did not suggest any other new medication at this time.

## 2014-02-06 ENCOUNTER — Encounter: Payer: Self-pay | Admitting: Family Medicine

## 2014-02-06 ENCOUNTER — Ambulatory Visit (INDEPENDENT_AMBULATORY_CARE_PROVIDER_SITE_OTHER): Payer: Medicare Other | Admitting: Family Medicine

## 2014-02-06 VITALS — BP 132/76 | HR 72 | Temp 97.6°F | Wt 148.5 lb

## 2014-02-06 DIAGNOSIS — G239 Degenerative disease of basal ganglia, unspecified: Principal | ICD-10-CM

## 2014-02-06 DIAGNOSIS — I2510 Atherosclerotic heart disease of native coronary artery without angina pectoris: Secondary | ICD-10-CM

## 2014-02-06 DIAGNOSIS — E039 Hypothyroidism, unspecified: Secondary | ICD-10-CM

## 2014-02-06 DIAGNOSIS — E7800 Pure hypercholesterolemia, unspecified: Secondary | ICD-10-CM

## 2014-02-06 DIAGNOSIS — G20 Parkinson's disease: Secondary | ICD-10-CM

## 2014-02-06 DIAGNOSIS — F329 Major depressive disorder, single episode, unspecified: Secondary | ICD-10-CM

## 2014-02-06 DIAGNOSIS — R296 Repeated falls: Secondary | ICD-10-CM

## 2014-02-06 DIAGNOSIS — Z9181 History of falling: Secondary | ICD-10-CM

## 2014-02-06 DIAGNOSIS — G20C Parkinsonism, unspecified: Secondary | ICD-10-CM

## 2014-02-06 DIAGNOSIS — G238 Other specified degenerative diseases of basal ganglia: Secondary | ICD-10-CM | POA: Insufficient documentation

## 2014-02-06 DIAGNOSIS — G250 Essential tremor: Secondary | ICD-10-CM

## 2014-02-06 DIAGNOSIS — G252 Other specified forms of tremor: Secondary | ICD-10-CM

## 2014-02-06 DIAGNOSIS — G903 Multi-system degeneration of the autonomic nervous system: Secondary | ICD-10-CM

## 2014-02-06 DIAGNOSIS — F3289 Other specified depressive episodes: Secondary | ICD-10-CM

## 2014-02-06 NOTE — Assessment & Plan Note (Addendum)
Followed by Dr. Rexene Alberts, appreciate neurology care. To re establish with local PT.  I asked her to check with PT about home safety evaluation.

## 2014-02-06 NOTE — Assessment & Plan Note (Signed)
Lab Results  Component Value Date   LDLCALC 58 07/07/2013

## 2014-02-06 NOTE — Progress Notes (Signed)
BP 132/76  Pulse 72  Temp(Src) 97.6 F (36.4 C) (Oral)  Wt 148 lb 8 oz (67.359 kg)  SpO2 96%   CC: transfer care from Dr. Linda Hedges  Subjective:    Patient ID: [name] [name], female    DOB: [birth year], 73 y.o.   MRN: [id number]  HPI: [name] [name] is a 73 y.o. female presenting on 02/06/2014 for Establish Care and Fall   Very pleasant 73 yo with h/o MSA presents with husband [name] today to transfer care from Dr Linda Hedges.    Suffered fall today.  Getting ready today fell backwards and hit posterior left head on dresser.  Didn't break skin.  No LOC.  On aspirin blood thinner a few times a week.  H/o recurrent falls over last 4.5 years 2/2 MSA.  Followed for MSA and parkinsonism by [facility] neurology as well as [facility] in [place] (sees yearly and next appt 03/2014).  To restart physical therapy per [facility].  Daughter Ples Specter died this month - severe MS.  Had a tough month.  Feels mood stable on sertraline 59m daily.  Tries to stay active with walker.    Lab Results  Component Value Date   TSH 0.50 07/07/2013  hypothyroidism - takes levothyroxine 1045m daily.  Preventative: Colonoscopy - 08/2013 int hemm, Dr StFuller Planlu shot [facility] 08/2013 prevnar 12/2013, pneumovax 1999, 2009 Td 2009 Zostavax 2011  Relevant past medical, surgical, family and social history reviewed and updated as indicated.  Allergies and medications reviewed and updated. Current Outpatient Prescriptions on File Prior to Visit  Medication Sig  . AFLURIA PRESERVATIVE FREE injection   . albuterol (PROAIR HFA) 108 (90 BASE) MCG/ACT inhaler Inhale 2 puffs into the lungs every 6 (six) hours as needed for wheezing or shortness of breath.   . beclomethasone (QVAR) 80 MCG/ACT inhaler Inhale 1 puff into the lungs daily as needed (wheezing).   . Calcium Carbonate-Vitamin D (CALCIUM-VITAMIN D) 500-200 MG-UNIT per tablet Take 1 tablet by mouth daily.    . clonazePAM (KLONOPIN) 0.5 MG tablet TAKE 1-2 TABLETS BY MOUTH ONCE  A DAY AS NEEDED  . cyanocobalamin (,VITAMIN B-12,) 1000 MCG/ML injection Inject 1,000 mcg into the muscle every 30 (thirty) days.    . famotidine (PEPCID) 40 MG tablet Take 40 mg by mouth 2 (two) times daily as needed for heartburn.  . fluocinolone (VANOS) 0.01 % cream Apply 1 application topically 2 (two) times daily as needed (itching).   . furosemide (LASIX) 20 MG tablet Take 20 mg by mouth daily as needed for fluid or edema. For severe swelling with discomfort.  . hydrOXYzine (ATARAX/VISTARIL) 10 MG tablet Take 10 mg by mouth 3 (three) times daily as needed for itching.  . isosorbide mononitrate (IMDUR) 30 MG 24 hr tablet Take 15 mg by mouth every evening.  . loratadine (CLARITIN) 10 MG tablet Take 10 mg by mouth 2 (two) times daily as needed.   . mometasone (NASONEX) 50 MCG/ACT nasal spray Place 2 sprays into the nose daily as needed (allergies).   . nitroGLYCERIN (NITROSTAT) 0.4 MG SL tablet Place 0.4 mg under the tongue every 5 (five) minutes as needed.    . propranolol (INDERAL) 10 MG tablet Take 10 mg by mouth 3 (three) times daily as needed (tremors).  . rosuvastatin (CRESTOR) 20 MG tablet Take 0.5 tablets (10 mg total) by mouth every evening.  . sertraline (ZOLOFT) 25 MG tablet Take 25 mg by mouth every morning.  . Marland KitchenYNTHROID 100 MCG tablet  TAKE 1 TABLET BY MOUTH DAILY.   No current facility-administered medications on file prior to visit.    Review of Systems Per HPI unless specifically indicated above    Objective:    BP 132/76  Pulse 72  Temp(Src) 97.6 F (36.4 C) (Oral)  Wt 148 lb 8 oz (67.359 kg)  SpO2 96%  Physical Exam  Nursing note and vitals reviewed. Constitutional: She appears well-developed and well-nourished. No distress.  Sitting in wheelchairt  HENT:  Head: Normocephalic and atraumatic.  Mouth/Throat: Oropharynx is clear and moist. No oropharyngeal exudate.  No goose egg or indentation, no break in skin, no tenderness with palpation of posterior occiput    Eyes: Conjunctivae and EOM are normal. Pupils are equal, round, and reactive to light.  Neck: Neck supple.  Cardiovascular: Normal rate, regular rhythm, normal heart sounds and intact distal pulses.   No murmur heard. Pulmonary/Chest: Effort normal and breath sounds normal. No respiratory distress. She has no wheezes. She has no rales.  Musculoskeletal: She exhibits edema (mild).  Neurological:  R>L weakness noted  Skin: Skin is warm and dry.  Psychiatric: She has a normal mood and affect.   Results for orders placed in visit on 01/18/14  HEMOCCULT SLIDES (X 3 CARDS)      Result Value Ref Range   OCCULT 1 Negative  Negative   OCCULT 2 Negative  Negative   OCCULT 3 Negative  Negative   OCCULT 4 Negative  Negative   OCCULT 5 Negative  Negative   Fecal Occult Blood Negative  Negative      Assessment & Plan:   Problem List Items Addressed This Visit   HYPOTHYROIDISM      Lab Results  Component Value Date   TSH 0.50 07/07/2013  check TFTs next blood work.    HYPERCHOLESTEROLEMIA  IIA      Lab Results  Component Value Date   LDLCALC 58 07/07/2013      Relevant Medications      aspirin (ASPIRIN EC) 81 MG EC tablet   DEPRESSION     Chronic, stable.  Discussed normal grieving /mourning after daughter's recent death    FAMILIAL TREMOR     Chronic, stable. On both propranolol and klonopin.     CORONARY HEART DISEASE     Asxs, continue ASA.    Shoulder pain, left - Primary   Atypical Parkinsonism     Continue f/u with neurology.    Multiple system atrophy     Followed by Dr. Rexene Alberts, appreciate neurology care. To re establish with local PT.  I asked her to check with PT about home safety evaluation.    Recurrent falls     Due to parkinsonisms/MSA.  Discussed meds that may increase fall risk including hydroxyzine and klonopin.        Follow up plan: Return in about 4 months (around 06/08/2014), or as needed, for follow up visit.

## 2014-02-06 NOTE — Assessment & Plan Note (Signed)
Chronic, stable. On both propranolol and klonopin.

## 2014-02-06 NOTE — Progress Notes (Signed)
Pre visit review using our clinic review tool, if applicable. No additional management support is needed unless otherwise documented below in the visit note. 

## 2014-02-06 NOTE — Assessment & Plan Note (Signed)
Lab Results  Component Value Date   TSH 0.50 07/07/2013  check TFTs next blood work.

## 2014-02-06 NOTE — Assessment & Plan Note (Signed)
Due to parkinsonisms/MSA.  Discussed meds that may increase fall risk including hydroxyzine and klonopin.

## 2014-02-06 NOTE — Patient Instructions (Addendum)
For hives - continue claritin as needed, try to minimize use of hydroxyzine (fall risk). Ask physical therapist about home safety evaluation. Good to meet you today, call us with questions. Return to see me in 3-4 months for follow up visit and blood work.

## 2014-02-06 NOTE — Assessment & Plan Note (Signed)
Chronic, stable.  Discussed normal grieving /mourning after daughter's recent death

## 2014-02-06 NOTE — Assessment & Plan Note (Signed)
Continue f/u with neurology.

## 2014-02-06 NOTE — Assessment & Plan Note (Signed)
Asxs, continue ASA.

## 2014-02-08 NOTE — Telephone Encounter (Signed)
Closing encounter

## 2014-02-19 ENCOUNTER — Ambulatory Visit
Admission: RE | Admit: 2014-02-19 | Discharge: 2014-02-19 | Disposition: A | Discharge status: Home / Self Care | Payer: Medicare Other | Source: Ambulatory Visit

## 2014-02-19 DIAGNOSIS — Z1231 Encounter for screening mammogram for malignant neoplasm of breast: Secondary | ICD-10-CM

## 2014-03-09 ENCOUNTER — Ambulatory Visit: Payer: Self-pay | Admitting: Nurse Practitioner

## 2014-03-23 ENCOUNTER — Telehealth: Payer: Self-pay | Admitting: Neurology

## 2014-03-23 NOTE — Telephone Encounter (Signed)
Pt called concerning the PT that Dr. Rexene Alberts was going to get orders on. Pt is wanting to know if this has been placed and who would be calling her to set up the apt. Please call pt back concerning this matter. Thanks

## 2014-03-26 NOTE — Telephone Encounter (Signed)
Patient is returning call--please call.

## 2014-03-27 NOTE — Telephone Encounter (Signed)
I called pt and she had not gotten a call about her physical therapy.  She has gone to neurorehab previously.   I called Angie and she will change in order for this to be neurorehab.  She will call pt.  (was not in their workqueue. (due to how order placed).

## 2014-04-03 ENCOUNTER — Other Ambulatory Visit: Payer: Self-pay | Admitting: Cardiology

## 2014-04-04 ENCOUNTER — Ambulatory Visit: Payer: Medicare Other | Admitting: Physical Therapy

## 2014-04-16 ENCOUNTER — Ambulatory Visit: Payer: Medicare Other | Attending: Neurology | Admitting: Physical Therapy

## 2014-04-16 DIAGNOSIS — R262 Difficulty in walking, not elsewhere classified: Secondary | ICD-10-CM | POA: Insufficient documentation

## 2014-04-16 DIAGNOSIS — Z5189 Encounter for other specified aftercare: Secondary | ICD-10-CM | POA: Insufficient documentation

## 2014-04-19 ENCOUNTER — Ambulatory Visit: Payer: Medicare Other | Admitting: Physical Therapy

## 2014-04-19 ENCOUNTER — Telehealth: Payer: Self-pay | Admitting: Neurology

## 2014-04-19 ENCOUNTER — Ambulatory Visit (INDEPENDENT_AMBULATORY_CARE_PROVIDER_SITE_OTHER): Payer: Medicare Other | Admitting: Neurology

## 2014-04-19 DIAGNOSIS — E538 Deficiency of other specified B group vitamins: Secondary | ICD-10-CM

## 2014-04-19 MED ORDER — CYANOCOBALAMIN 1000 MCG/ML IJ SOLN
1000.0000 ug | Freq: Once | INTRAMUSCULAR | Status: AC
  Administered 2014-04-19: 1000 ug via INTRAMUSCULAR

## 2014-04-19 NOTE — Telephone Encounter (Signed)
Patient wanting to know if she could come by today around 3:00 for b12 shot..Please call and advise.

## 2014-04-19 NOTE — Progress Notes (Signed)
Patient here for B12 injection.  Under aseptic technique Cyanocobalamin given IM in left deltoid.  Tolerated well.  Band-Aid applied.

## 2014-04-19 NOTE — Telephone Encounter (Signed)
Patient came in and received her B12 injection.

## 2014-04-19 NOTE — Patient Instructions (Signed)
To return next month.

## 2014-04-26 ENCOUNTER — Ambulatory Visit: Payer: Medicare Other | Admitting: Physical Therapy

## 2014-04-30 ENCOUNTER — Ambulatory Visit: Payer: Medicare Other | Admitting: Physical Therapy

## 2014-05-02 ENCOUNTER — Encounter: Payer: Self-pay | Admitting: Internal Medicine

## 2014-05-02 ENCOUNTER — Ambulatory Visit (INDEPENDENT_AMBULATORY_CARE_PROVIDER_SITE_OTHER): Payer: Medicare Other | Admitting: Internal Medicine

## 2014-05-02 VITALS — BP 112/64 | HR 62 | Temp 97.7°F

## 2014-05-02 DIAGNOSIS — S81831A Puncture wound without foreign body, right lower leg, initial encounter: Secondary | ICD-10-CM

## 2014-05-02 DIAGNOSIS — R609 Edema, unspecified: Secondary | ICD-10-CM

## 2014-05-02 DIAGNOSIS — S81809A Unspecified open wound, unspecified lower leg, initial encounter: Secondary | ICD-10-CM

## 2014-05-02 NOTE — Progress Notes (Signed)
Subjective:    Patient ID: [name] [name], female    DOB: [birth year], 73 y.o.   MRN: [id number]  HPI  Pt presents to the clinic today with c/o bilateral leg swelling. She reports this started after she hit her right leg on her wheelchair 2 days ago. The right leg is starting to weep. She denies pain, numbness or tingling in the lower extremities. She is currently on 20 mg Lasix daily. She does have a history of venous insufficieny.  Review of Systems      Past Medical History  Diagnosis Date  . GERD (gastroesophageal reflux disease)     s/p nissen  . Familial tremor     followed by Dr. Erling Cruz  . Unspecified chronic bronchitis   . Irritable bladder   . History of pneumonia   . Hypothyroidism   . Depression   . Asthma   . CAD (coronary artery disease)     myoview 5/08:  EF 76%, no scar, no ischemia, +ECG changes with exercise;  cath 04/08/07:  pLAD 30%, mLAD 60-70% - med tx.  Marland Kitchen HLD (hyperlipidemia)   . Multiple system atrophy   . VAGINITIS, ATROPHIC 11/07/2007    Current Outpatient Prescriptions  Medication Sig Dispense Refill  . AFLURIA PRESERVATIVE FREE injection       . albuterol (PROAIR HFA) 108 (90 BASE) MCG/ACT inhaler Inhale 2 puffs into the lungs every 6 (six) hours as needed for wheezing or shortness of breath.       Marland Kitchen aspirin (ASPIRIN EC) 81 MG EC tablet Take 81 mg by mouth 2 (two) times a week. Swallow whole.      . beclomethasone (QVAR) 80 MCG/ACT inhaler Inhale 1 puff into the lungs daily as needed (wheezing).       . Calcium Carbonate-Vitamin D (CALCIUM-VITAMIN D) 500-200 MG-UNIT per tablet Take 1 tablet by mouth daily.        . clonazePAM (KLONOPIN) 0.5 MG tablet TAKE 1-2 TABLETS BY MOUTH ONCE A DAY AS NEEDED  60 tablet  4  . cyanocobalamin (,VITAMIN B-12,) 1000 MCG/ML injection Inject 1,000 mcg into the muscle every 30 (thirty) days.        . famotidine (PEPCID) 40 MG tablet Take 40 mg by mouth 2 (two) times daily as needed for heartburn.      . fluocinolone  (VANOS) 0.01 % cream Apply 1 application topically 2 (two) times daily as needed (itching).       . furosemide (LASIX) 20 MG tablet Take 20 mg by mouth daily as needed for fluid or edema. For severe swelling with discomfort.      . hydrOXYzine (ATARAX/VISTARIL) 10 MG tablet Take 10 mg by mouth 3 (three) times daily as needed for itching.      . isosorbide mononitrate (IMDUR) 30 MG 24 hr tablet Take 15 mg by mouth every evening.      . isosorbide mononitrate (IMDUR) 30 MG 24 hr tablet TAKE 1/2 TABLET BY MOUTH DAILY  30 tablet  1  . loratadine (CLARITIN) 10 MG tablet Take 10 mg by mouth 2 (two) times daily as needed.       . mometasone (NASONEX) 50 MCG/ACT nasal spray Place 2 sprays into the nose daily as needed (allergies).       . nitroGLYCERIN (NITROSTAT) 0.4 MG SL tablet Place 0.4 mg under the tongue every 5 (five) minutes as needed.        . propranolol (INDERAL) 10 MG tablet Take 10 mg by mouth  3 (three) times daily as needed (tremors).      . rosuvastatin (CRESTOR) 20 MG tablet Take 0.5 tablets (10 mg total) by mouth every evening.  45 tablet  3  . sertraline (ZOLOFT) 25 MG tablet Take 25 mg by mouth every morning.      Marland Kitchen SYNTHROID 100 MCG tablet TAKE 1 TABLET BY MOUTH DAILY.  30 tablet  6   No current facility-administered medications for this visit.    No Known Allergies  Family History  Problem Relation Age of Onset  . Coronary artery disease    . Heart attack    . Hypertension    . Diabetes    . Breast cancer    . Hypertension Mother   . Heart disease Father     History   Social History  . Marital Status: Married    Spouse Name: N/A    Number of Children: N/A  . Years of Education: N/A   Occupational History  . Not on file.   Social History Main Topics  . Smoking status: Never Smoker   . Smokeless tobacco: Never Used  . Alcohol Use: No  . Drug Use: No  . Sexual Activity: Not Currently   Other Topics Concern  . Not on file   Social History Narrative   HSG,  retired from [employer] . married '60. 1 son - '65; 1 daughter - '61; 2 grandchildren; 1 great-grandchild. Homemaker. marriage in good health. 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 Positive history of passive tobacco smoke exposure.     Constitutional: Denies fever, malaise, fatigue, headache or abrupt weight changes.  Respiratory: Denies difficulty breathing, shortness of breath, cough or sputum production.   Cardiovascular: Pt report swelling of bilateral lower extremities. Denies chest pain, chest tightness, palpitations or swelling in the hands.   Skin: Denies redness, rashes, lesions or ulcercations.    No other specific complaints in a complete review of systems (except as listed in HPI above).  Objective:   Physical Exam   BP 112/64  Pulse 62  Temp(Src) 97.7 F (36.5 C) (Oral)  SpO2 97% Wt Readings from Last 3 Encounters:  02/06/14 148 lb 8 oz (67.359 kg)  02/02/14 153 lb (69.4 kg)  12/05/13 143 lb (64.864 kg)    General: Appears her stated age, well developed, well nourished in NAD. Skin: Warm, dry and intact. No rashes, lesions or ulcerations noted. Cardiovascular: Normal rate and rhythm. S1,S2 noted.  No murmur, rubs or gallops noted. No JVD. 3+ pitting edema noted. No carotid bruits noted. Pulmonary/Chest: Normal effort and positive vesicular breath sounds. No respiratory distress. No wheezes, rales or ronchi noted.     BMET    Component Value Date/Time   NA 140 03/22/2013 1558   K 5.1 03/22/2013 1558   CL 105 03/22/2013 1558   CO2 30 03/22/2013 1558   GLUCOSE 103* 03/22/2013 1558   BUN 18 03/22/2013 1558   CREATININE 0.6 03/22/2013 1558   CALCIUM 9.3 03/22/2013 1558   GFRNONAA 83.74 06/09/2010 1517   GFRAA 107 05/10/2008 1238    Lipid Panel     Component Value Date/Time   CHOL 135 07/07/2013 1351   TRIG 69.0 07/07/2013 1351   HDL 62.90 07/07/2013 1351   CHOLHDL 2 07/07/2013 1351   VLDL 13.8 07/07/2013 1351   LDLCALC 58 07/07/2013 1351    CBC    Component Value Date/Time    WBC 4.9 08/28/2013 1406   RBC 4.42 08/28/2013 1406   HGB 12.9 08/28/2013 1406  HCT 38.1 08/28/2013 1406   PLT 225 08/28/2013 1406   MCV 86.2 08/28/2013 1406   MCH 29.2 08/28/2013 1406   MCHC 33.9 08/28/2013 1406   RDW 13.2 08/28/2013 1406   LYMPHSABS 2.2 08/11/2013 1445   MONOABS 0.5 08/11/2013 1445   EOSABS 0.1 08/11/2013 1445   BASOSABS 0.0 08/11/2013 1445    Hgb A1C No results found for this basename: HGBA1C        Assessment & Plan:   Weeping from RLE wound secondary to peripheral edema:  No infection noted- watch for redness, tenderness of pus like drainage Advised her to keep covered with gauze Take your lasix daily x 3 days if you can Keep your legs elevated to help reduce swelling  RTC as needed or if symptoms persist or worsen

## 2014-05-02 NOTE — Progress Notes (Signed)
Pre visit review using our clinic review tool, if applicable. No additional management support is needed unless otherwise documented below in the visit note. 

## 2014-05-02 NOTE — Patient Instructions (Addendum)

## 2014-05-03 ENCOUNTER — Ambulatory Visit: Payer: Medicare Other | Admitting: Physical Therapy

## 2014-05-07 ENCOUNTER — Ambulatory Visit: Payer: Medicare Other | Admitting: Physical Therapy

## 2014-05-09 ENCOUNTER — Ambulatory Visit: Payer: Medicare Other | Attending: Neurology | Admitting: Physical Therapy

## 2014-05-09 DIAGNOSIS — R262 Difficulty in walking, not elsewhere classified: Secondary | ICD-10-CM | POA: Insufficient documentation

## 2014-05-09 DIAGNOSIS — Z5189 Encounter for other specified aftercare: Secondary | ICD-10-CM | POA: Insufficient documentation

## 2014-05-10 ENCOUNTER — Telehealth: Payer: Self-pay | Admitting: Family Medicine

## 2014-05-10 NOTE — Telephone Encounter (Signed)
Pt's husband on his way to pick up rx for lift chair.

## 2014-05-10 NOTE — Telephone Encounter (Signed)
Wrote out script for lift chair and placed in my out box - may pick up today.  Recommend findout company they previously used for current lift chair and contact them, otherwise may try to take to local durable medical equipment store.

## 2014-05-10 NOTE — Telephone Encounter (Signed)
Patient's lift chair is "burned up" and will not work.  She needs a new lift chair to sleep in at night.  Her husband wants to know what they need to do to get this process started.  The pt couldn't sleep at all last night.  Her husband states she needs to get this today.

## 2014-05-12 ENCOUNTER — Encounter: Payer: Self-pay | Admitting: Family Medicine

## 2014-05-14 ENCOUNTER — Ambulatory Visit: Payer: Medicare Other | Admitting: Physical Therapy

## 2014-05-14 DIAGNOSIS — Z5189 Encounter for other specified aftercare: Secondary | ICD-10-CM | POA: Diagnosis not present

## 2014-05-16 ENCOUNTER — Ambulatory Visit: Payer: Medicare Other | Admitting: Physical Therapy

## 2014-05-16 DIAGNOSIS — Z5189 Encounter for other specified aftercare: Secondary | ICD-10-CM | POA: Diagnosis not present

## 2014-05-18 ENCOUNTER — Telehealth: Payer: Self-pay

## 2014-05-18 ENCOUNTER — Other Ambulatory Visit: Payer: Self-pay | Admitting: Family Medicine

## 2014-05-18 DIAGNOSIS — E7800 Pure hypercholesterolemia, unspecified: Secondary | ICD-10-CM

## 2014-05-18 DIAGNOSIS — E039 Hypothyroidism, unspecified: Secondary | ICD-10-CM

## 2014-05-18 NOTE — Telephone Encounter (Signed)
Rx called in as directed.   

## 2014-05-18 NOTE — Telephone Encounter (Signed)
Ok to refill 

## 2014-05-18 NOTE — Telephone Encounter (Signed)
plz phone in. 

## 2014-05-18 NOTE — Telephone Encounter (Signed)
Pt left v/m; pt was seen at [facility] clinic in June 2015 and the doctor did not do lab testing this time. Pt already has appt scheduled with Dr Darnell Level on 06/07/14 at 3 PM and wants to know if pt can wait to have lab testing done at that appt.pt request cb.

## 2014-05-20 NOTE — Telephone Encounter (Signed)
Ok to wait - but I'd like her to be fasting for 6 hrs prior to labwork as I will check cholesterol levels and sugar.

## 2014-05-21 NOTE — Telephone Encounter (Signed)
Message left notifying patient.

## 2014-05-28 ENCOUNTER — Ambulatory Visit: Payer: Medicare Other | Admitting: Physical Therapy

## 2014-05-29 ENCOUNTER — Other Ambulatory Visit: Payer: Self-pay | Admitting: *Deleted

## 2014-05-29 MED ORDER — SERTRALINE HCL 25 MG PO TABS: 25.0000 mg | ORAL_TABLET | Freq: Every morning | ORAL | Status: DC

## 2014-05-30 ENCOUNTER — Ambulatory Visit: Payer: Medicare Other | Admitting: Physical Therapy

## 2014-05-30 DIAGNOSIS — Z5189 Encounter for other specified aftercare: Secondary | ICD-10-CM | POA: Diagnosis not present

## 2014-06-04 ENCOUNTER — Ambulatory Visit: Payer: Medicare Other | Admitting: Physical Therapy

## 2014-06-04 DIAGNOSIS — Z5189 Encounter for other specified aftercare: Secondary | ICD-10-CM | POA: Diagnosis not present

## 2014-06-05 ENCOUNTER — Ambulatory Visit (INDEPENDENT_AMBULATORY_CARE_PROVIDER_SITE_OTHER): Payer: Medicare Other | Admitting: Cardiovascular Disease

## 2014-06-05 ENCOUNTER — Encounter: Payer: Self-pay | Admitting: Cardiovascular Disease

## 2014-06-05 VITALS — BP 132/71 | HR 66 | Ht 62.0 in | Wt 148.0 lb

## 2014-06-05 DIAGNOSIS — E7800 Pure hypercholesterolemia, unspecified: Secondary | ICD-10-CM

## 2014-06-05 DIAGNOSIS — I2510 Atherosclerotic heart disease of native coronary artery without angina pectoris: Secondary | ICD-10-CM

## 2014-06-05 NOTE — Patient Instructions (Signed)
Your physician wants you to follow-up in: 6 MONTHS with Dr Burt Knack.  You will receive a reminder letter in the mail two months in advance. If you don't receive a letter, please call our office to schedule the follow-up appointment.  Your physician recommends that you continue on your current medications as directed. Please refer to the Current Medication list given to you today.

## 2014-06-05 NOTE — Progress Notes (Signed)
HPI:  73 year old woman presenting for followup evaluation. She is followed for coronary artery disease. 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 Her neurologic symptoms have been progressive over the last 3 years and she anticipates progressive decline.   Last lipids 07/07/2013: Lipid Panel     Component Value Date/Time   CHOL 135 07/07/2013 1351   TRIG 69.0 07/07/2013 1351   HDL 62.90 07/07/2013 1351   CHOLHDL 2 07/07/2013 1351   VLDL 13.8 07/07/2013 1351   LDLCALC 58 07/07/2013 1351   The patient has rare episodes where she feels as if she can't catch her breath. These are not associated with any particular activity. More often than not, she awakens from sleep with this. Within a few minutes her breathing has normalized. These symptoms predated her diagnosis of multiple systems atrophy. She has had no chest pain or pressure. She does have chronic leg swelling with no recent change. No orthopnea or PND.   Outpatient Encounter Prescriptions as of 06/05/2014  Medication Sig  . AFLURIA PRESERVATIVE FREE injection   . albuterol (PROAIR HFA) 108 (90 BASE) MCG/ACT inhaler Inhale 2 puffs into the lungs every 6 (six) hours as needed for wheezing or shortness of breath.   Marland Kitchen aspirin (ASPIRIN EC) 81 MG EC tablet Take 81 mg by mouth 2 (two) times a week. Swallow whole.  . beclomethasone (QVAR) 80 MCG/ACT inhaler Inhale 1 puff into the lungs daily as needed (wheezing).   . Calcium Carbonate-Vitamin D (CALCIUM-VITAMIN D) 500-200 MG-UNIT per tablet Take 1 tablet by mouth daily.    . clonazePAM (KLONOPIN) 0.5 MG tablet TAKE 1-2 TABLETS BY MOUTH ONCE A DAY AS NEEDED  . cyanocobalamin (,VITAMIN B-12,) 1000 MCG/ML injection Inject 1,000 mcg into the muscle every 30 (thirty) days.    . famotidine (PEPCID) 40 MG tablet Take 40 mg by mouth 2 (two) times daily as needed for heartburn.  . fluocinolone (VANOS) 0.01 % cream Apply 1 application  topically 2 (two) times daily as needed (itching).   . furosemide (LASIX) 20 MG tablet Take 20 mg by mouth daily as needed for fluid or edema. For severe swelling with discomfort.  . hydrOXYzine (ATARAX/VISTARIL) 10 MG tablet Take 10 mg by mouth 3 (three) times daily as needed for itching.  . isosorbide mononitrate (IMDUR) 30 MG 24 hr tablet TAKE 1/2 TABLET BY MOUTH DAILY  . loratadine (CLARITIN) 10 MG tablet Take 10 mg by mouth 2 (two) times daily as needed.   . mometasone (NASONEX) 50 MCG/ACT nasal spray Place 2 sprays into the nose daily as needed (allergies).   . nitroGLYCERIN (NITROSTAT) 0.4 MG SL tablet Place 0.4 mg under the tongue every 5 (five) minutes as needed.    . propranolol (INDERAL) 10 MG tablet Take 10 mg by mouth 3 (three) times daily as needed (tremors).  . rosuvastatin (CRESTOR) 20 MG tablet Take 0.5 tablets (10 mg total) by mouth every evening.  . sertraline (ZOLOFT) 25 MG tablet Take 1 tablet (25 mg total) by mouth every morning.  Marland Kitchen SYNTHROID 100 MCG tablet TAKE 1 TABLET BY MOUTH DAILY.  . [DISCONTINUED] clonazePAM (KLONOPIN) 0.5 MG tablet TAKE 1-2 TABLETS BY MOUTH ONCE A DAY AS NEEDED  . [DISCONTINUED] isosorbide mononitrate (IMDUR) 30 MG 24 hr tablet Take 15 mg by mouth every evening.  . [DISCONTINUED] sertraline (ZOLOFT) 25 MG tablet Take 25 mg by mouth every morning.  . [DISCONTINUED] SYNTHROID 100 MCG tablet TAKE 1 TABLET  BY MOUTH DAILY.    No Known Allergies  Past Medical History  Diagnosis Date  . GERD (gastroesophageal reflux disease)     s/p nissen  . Familial tremor     followed by Dr. Erling Cruz  . Unspecified chronic bronchitis   . Irritable bladder   . History of pneumonia   . Hypothyroidism   . Depression   . Asthma   . CAD (coronary artery disease)     myoview 5/08:  EF 76%, no scar, no ischemia, +ECG changes with exercise;  cath 04/08/07:  pLAD 30%, mLAD 60-70% - med tx.  Marland Kitchen HLD (hyperlipidemia)   . Multiple system atrophy   . VAGINITIS, ATROPHIC  11/07/2007    ROS: Negative except as per HPI  BP 132/71  Pulse 66  Ht 5' 2"  (1.575 m)  Wt 148 lb (67.132 kg)  BMI 27.06 kg/m2  PHYSICAL EXAM: Pt is alert and oriented, NAD HEENT: normal Neck: JVP - normal, carotids 2+= without bruits Lungs: CTA bilaterally CV: RRR without murmur or gallop Abd: soft, NT, Positive BS, no hepatomegaly Ext: 2+ bilateral pretibial edema, distal pulses intact and equal Skin: warm/dry no rash  EKG:  Normal sinus rhythm 66 beats per minute, nonspecific ST abnormality, otherwise within normal limits.  ASSESSMENT AND PLAN: 1. Coronary artery disease, native vessel. The patient is experiencing no anginal symptoms. Her medical program was reviewed. I do not think her rare episodes of breathlessness are related to ischemic disease. She will continue on her current medical program.  2. Hyperlipidemia. Patient is managed with low-dose Crestor. Last lipids reviewed as above.  For followup I will see her back in 6 months.  [name] 06/05/2014 3:23 PM

## 2014-06-06 ENCOUNTER — Ambulatory Visit: Payer: Medicare Other | Admitting: Physical Therapy

## 2014-06-06 DIAGNOSIS — Z5189 Encounter for other specified aftercare: Secondary | ICD-10-CM | POA: Diagnosis not present

## 2014-06-07 ENCOUNTER — Encounter: Payer: Self-pay | Admitting: Family Medicine

## 2014-06-07 ENCOUNTER — Ambulatory Visit (INDEPENDENT_AMBULATORY_CARE_PROVIDER_SITE_OTHER): Payer: Medicare Other | Admitting: Family Medicine

## 2014-06-07 VITALS — BP 134/72 | HR 68 | Temp 97.8°F

## 2014-06-07 DIAGNOSIS — G239 Degenerative disease of basal ganglia, unspecified: Secondary | ICD-10-CM

## 2014-06-07 DIAGNOSIS — R609 Edema, unspecified: Secondary | ICD-10-CM

## 2014-06-07 DIAGNOSIS — E538 Deficiency of other specified B group vitamins: Secondary | ICD-10-CM | POA: Insufficient documentation

## 2014-06-07 DIAGNOSIS — G238 Other specified degenerative diseases of basal ganglia: Secondary | ICD-10-CM

## 2014-06-07 DIAGNOSIS — E039 Hypothyroidism, unspecified: Secondary | ICD-10-CM

## 2014-06-07 DIAGNOSIS — E7800 Pure hypercholesterolemia, unspecified: Secondary | ICD-10-CM

## 2014-06-07 DIAGNOSIS — E539 Vitamin B deficiency, unspecified: Secondary | ICD-10-CM

## 2014-06-07 DIAGNOSIS — R600 Localized edema: Secondary | ICD-10-CM

## 2014-06-07 DIAGNOSIS — G903 Multi-system degeneration of the autonomic nervous system: Secondary | ICD-10-CM

## 2014-06-07 MED ORDER — CYANOCOBALAMIN 1000 MCG/ML IJ SOLN
1000.0000 ug | Freq: Once | INTRAMUSCULAR | Status: AC
  Administered 2014-06-07: 1000 ug via INTRAMUSCULAR

## 2014-06-07 NOTE — Progress Notes (Signed)
BP 134/72  Pulse 68  Temp(Src) 97.8 F (36.6 C) (Oral)   CC: f/u visit  Subjective:    Patient ID: [name] [name], female    DOB: August 12, 1941, 73 y.o.   MRN: [id number]  HPI: [name] [name] is a 73 y.o. female presenting on 06/07/2014 for Follow-up   Very pleasant 73 yo with h/o MSA presents with husband [name] today for follow up visit. Recently seen by [facility] with pedal edema rec daily lasix for 3 days which helped sxs. She also recently saw cardiology with stable medically treated CAD. Persistent chronic leg swelling with scab on right lateral leg - no more weeping. Taking lasix 10m qd prn - but hesitant to use 2/2 overactive bladder worse with MSA.  Here today for labwork and follow up visit. Fasting since breakfast at 8am.  [facility] but planning on just returning for f/u with local neurologist.  Relevant past medical, surgical, family and social history reviewed and updated as indicated.  Allergies and medications reviewed and updated. Current Outpatient Prescriptions on File Prior to Visit  Medication Sig  . AFLURIA PRESERVATIVE FREE injection   . albuterol (PROAIR HFA) 108 (90 BASE) MCG/ACT inhaler Inhale 2 puffs into the lungs every 6 (six) hours as needed for wheezing or shortness of breath.   .Marland Kitchenaspirin (ASPIRIN EC) 81 MG EC tablet Take 81 mg by mouth 2 (two) times a week. Swallow whole.  . beclomethasone (QVAR) 80 MCG/ACT inhaler Inhale 1 puff into the lungs daily as needed (wheezing).   . Calcium Carbonate-Vitamin D (CALCIUM-VITAMIN D) 500-200 MG-UNIT per tablet Take 1 tablet by mouth daily.    . clonazePAM (KLONOPIN) 0.5 MG tablet TAKE 1-2 TABLETS BY MOUTH ONCE A DAY AS NEEDED  . cyanocobalamin (,VITAMIN B-12,) 1000 MCG/ML injection Inject 1,000 mcg into the muscle every 30 (thirty) days.    . famotidine (PEPCID) 40 MG tablet Take 40 mg by mouth 2 (two) times daily as needed for  heartburn.  . fluocinolone (VANOS) 0.01 % cream Apply 1 application topically 2 (two) times daily as needed (itching).   . furosemide (LASIX) 20 MG tablet Take 20 mg by mouth daily as needed for fluid or edema. For severe swelling with discomfort.  . hydrOXYzine (ATARAX/VISTARIL) 10 MG tablet Take 10 mg by mouth 3 (three) times daily as needed for itching.  . isosorbide mononitrate (IMDUR) 30 MG 24 hr tablet TAKE 1/2 TABLET BY MOUTH DAILY  . loratadine (CLARITIN) 10 MG tablet Take 10 mg by mouth 2 (two) times daily as needed.   . mometasone (NASONEX) 50 MCG/ACT nasal spray Place 2 sprays into the nose daily as needed (allergies).   . nitroGLYCERIN (NITROSTAT) 0.4 MG SL tablet Place 0.4 mg under the tongue every 5 (five) minutes as needed.    . propranolol (INDERAL) 10 MG tablet Take 10 mg by mouth 3 (three) times daily as needed (tremors).  . rosuvastatin (CRESTOR) 20 MG tablet Take 0.5 tablets (10 mg total) by mouth every evening.  . sertraline (ZOLOFT) 25 MG tablet Take 1 tablet (25 mg total) by mouth every morning.  .Marland KitchenSYNTHROID 100 MCG tablet TAKE 1 TABLET BY MOUTH DAILY.   No current facility-administered medications on file prior to visit.    Review of Systems Per HPI unless specifically indicated above    Objective:    BP 134/72  Pulse 68  Temp(Src) 97.8 F (36.6 C) (  Oral)  Physical Exam  Nursing note and vitals reviewed. Constitutional: She appears well-developed and well-nourished. No distress.  HENT:  Mouth/Throat: Oropharynx is clear and moist. No oropharyngeal exudate.  Eyes: Conjunctivae and EOM are normal. Pupils are equal, round, and reactive to light.  Neck: Normal range of motion. Neck supple. No thyromegaly present.  Cardiovascular: Normal rate, regular rhythm, normal heart sounds and intact distal pulses.   No murmur heard. Pulmonary/Chest: Effort normal and breath sounds normal. No respiratory distress. She has no wheezes. She has no rales.  Musculoskeletal:  Normal range of motion. She exhibits edema (1+ pedal edema).  R lateral leg with scabbed lesion, no longer weeping, no surrounding erythema or induration  Skin: Skin is warm and dry. No rash noted.  Psychiatric: She has a normal mood and affect.       Assessment & Plan:   Problem List Items Addressed This Visit   Multiple system atrophy     Appreciate neurology care. Sees PT at [facility]. Has been advised to spend more time in wheelchair. In process of lift chair re installation     Relevant Orders      CBC with Differential   Lower extremity edema     Chronic, stable. Continue regimen of lasix 61m prn. Avoids 2/2 baseline urgency. Anticipate CVI contributing.    HYPOTHYROIDISM      Check TSH today. Lab Results  Component Value Date   TSH 0.50 07/07/2013      HYPERCHOLESTEROLEMIA  IIA - Primary     Check FLP as fasting today. Continue crestor 278mdaily. Check LFTs per patient request.    Relevant Orders      Hepatic function panel   B-complex deficiency     Pt requests B12 shot today as pt at our office - normally receives at neurologist's.        Follow up plan: Return in about 6 months (around 12/08/2014), or as needed, for medicare wellness visit.

## 2014-06-07 NOTE — Assessment & Plan Note (Signed)
Check FLP as fasting today. Continue crestor 67m daily. Check LFTs per patient request.

## 2014-06-07 NOTE — Assessment & Plan Note (Addendum)
Pt requests B12 shot today as pt at our office - normally receives at neurologist's.

## 2014-06-07 NOTE — Addendum Note (Signed)
Addended by: Royann Shivers A on: 06/07/2014 03:52 PM   Modules accepted: Orders

## 2014-06-07 NOTE — Progress Notes (Signed)
Pre visit review using our clinic review tool, if applicable. No additional management support is needed unless otherwise documented below in the visit note. 

## 2014-06-07 NOTE — Patient Instructions (Addendum)
Blood work today. B12 shot today. Good to see you, call us with questions. Return to see me as needed or in 6 months for medicare wellness visit

## 2014-06-07 NOTE — Assessment & Plan Note (Signed)
Check TSH today. Lab Results  Component Value Date   TSH 0.50 07/07/2013

## 2014-06-07 NOTE — Assessment & Plan Note (Signed)
Appreciate neurology care. Sees PT at [facility]. Has been advised to spend more time in wheelchair. In process of lift chair re installation

## 2014-06-07 NOTE — Assessment & Plan Note (Addendum)
Chronic, stable. Continue regimen of lasix 18m prn. Avoids 2/2 baseline urgency. Anticipate CVI contributing.

## 2014-06-08 LAB — LIPID PANEL
CHOLESTEROL: 113 mg/dL (ref 0–200)
HDL: 47.5 mg/dL (ref >39.00)
LDL Cholesterol: 49 mg/dL (ref 0–99)
NonHDL: 65.5
TRIGLYCERIDES: 83 mg/dL (ref 0.0–149.0)
Total CHOL/HDL Ratio: 2
VLDL: 16.6 mg/dL (ref 0.0–40.0)

## 2014-06-08 LAB — TSH: TSH: 2.43 u[IU]/mL (ref 0.35–4.50)

## 2014-06-08 LAB — CBC WITH DIFFERENTIAL/PLATELET
BASOS PCT: 0.5 % (ref 0.0–3.0)
Basophils Absolute: 0 10*3/uL (ref 0.0–0.1)
EOS PCT: 1.1 % (ref 0.0–5.0)
Eosinophils Absolute: 0.1 10*3/uL (ref 0.0–0.7)
HCT: 41.5 % (ref 36.0–46.0)
HEMOGLOBIN: 13.8 g/dL (ref 12.0–15.0)
LYMPHS PCT: 37 % (ref 12.0–46.0)
Lymphs Abs: 2.2 10*3/uL (ref 0.7–4.0)
MCHC: 33.3 g/dL (ref 30.0–36.0)
MCV: 89.1 fl (ref 78.0–100.0)
MONO ABS: 0.4 10*3/uL (ref 0.1–1.0)
Monocytes Relative: 7.4 % (ref 3.0–12.0)
NEUTROS PCT: 54 % (ref 43.0–77.0)
Neutro Abs: 3.2 10*3/uL (ref 1.4–7.7)
Platelets: 262 10*3/uL (ref 150.0–400.0)
RBC: 4.66 Mil/uL (ref 3.87–5.11)
RDW: 13.5 % (ref 11.5–15.5)
WBC: 5.9 10*3/uL (ref 4.0–10.5)

## 2014-06-08 LAB — BASIC METABOLIC PANEL
BUN: 15 mg/dL (ref 6–23)
CO2: 27 meq/L (ref 19–32)
Calcium: 9.5 mg/dL (ref 8.4–10.5)
Chloride: 107 mEq/L (ref 96–112)
Creatinine, Ser: 0.7 mg/dL (ref 0.4–1.2)
GFR: 89.87 mL/min (ref >60.00)
Glucose, Bld: 93 mg/dL (ref 70–99)
Potassium: 4.6 mEq/L (ref 3.5–5.1)
SODIUM: 141 meq/L (ref 135–145)

## 2014-06-08 LAB — HEPATIC FUNCTION PANEL
ALK PHOS: 123 U/L — AB (ref 39–117)
ALT: 16 U/L (ref 0–35)
AST: 20 U/L (ref 0–37)
Albumin: 4 g/dL (ref 3.5–5.2)
BILIRUBIN DIRECT: 0.1 mg/dL (ref 0.0–0.3)
Total Bilirubin: 0.4 mg/dL (ref 0.2–1.2)
Total Protein: 6.7 g/dL (ref 6.0–8.3)

## 2014-06-12 ENCOUNTER — Ambulatory Visit: Payer: Medicare Other | Attending: Neurology | Admitting: Physical Therapy

## 2014-06-12 DIAGNOSIS — Z5189 Encounter for other specified aftercare: Secondary | ICD-10-CM | POA: Insufficient documentation

## 2014-06-12 DIAGNOSIS — R262 Difficulty in walking, not elsewhere classified: Secondary | ICD-10-CM | POA: Insufficient documentation

## 2014-06-14 ENCOUNTER — Ambulatory Visit: Payer: Medicare Other | Admitting: Physical Therapy

## 2014-06-14 DIAGNOSIS — Z5189 Encounter for other specified aftercare: Secondary | ICD-10-CM | POA: Diagnosis not present

## 2014-06-14 NOTE — Telephone Encounter (Signed)
Noted  

## 2014-06-19 ENCOUNTER — Ambulatory Visit: Payer: Medicare Other | Admitting: Physical Therapy

## 2014-06-21 ENCOUNTER — Ambulatory Visit: Payer: Medicare Other | Admitting: Physical Therapy

## 2014-06-25 ENCOUNTER — Other Ambulatory Visit: Payer: Self-pay | Admitting: Family Medicine

## 2014-06-25 NOTE — Telephone Encounter (Signed)
Ok to refill 

## 2014-06-25 NOTE — Telephone Encounter (Signed)
plz phone in. 

## 2014-06-26 NOTE — Telephone Encounter (Signed)
Rx called in as directed.   

## 2014-07-05 ENCOUNTER — Ambulatory Visit (INDEPENDENT_AMBULATORY_CARE_PROVIDER_SITE_OTHER): Payer: Medicare Other | Admitting: Neurology

## 2014-07-05 ENCOUNTER — Encounter: Payer: Self-pay | Admitting: Neurology

## 2014-07-05 ENCOUNTER — Ambulatory Visit (INDEPENDENT_AMBULATORY_CARE_PROVIDER_SITE_OTHER): Payer: Medicare Other

## 2014-07-05 VITALS — BP 123/76 | HR 68 | Temp 96.0°F

## 2014-07-05 DIAGNOSIS — E538 Deficiency of other specified B group vitamins: Secondary | ICD-10-CM

## 2014-07-05 DIAGNOSIS — R296 Repeated falls: Secondary | ICD-10-CM

## 2014-07-05 DIAGNOSIS — G232 Striatonigral degeneration: Secondary | ICD-10-CM

## 2014-07-05 DIAGNOSIS — Z9181 History of falling: Secondary | ICD-10-CM

## 2014-07-05 DIAGNOSIS — R269 Unspecified abnormalities of gait and mobility: Secondary | ICD-10-CM

## 2014-07-05 DIAGNOSIS — G238 Other specified degenerative diseases of basal ganglia: Secondary | ICD-10-CM

## 2014-07-05 NOTE — Progress Notes (Signed)
Subjective:    Patient ID: [name] [name] is a 73 y.o. female.  HPI    Interim history:  [name] [name] is a very pleasant 73year-old right-handed woman with an underlying complex medical history of thyroid disease, [place] spotted fever at age 47, asthma, cardiac disease, essential tremor, hearing loss, hyperlipidemia, reflux disease with surgical repair, who presents for followup consultation of her gait disorder, essential tremor, and recurrent falls, concern for MSA. She is accompanied by her husband again today. I last saw her on 02/02/2014, at which time she reported that physical therapy was helpful. She had fallen a few times and bruised her ribs. She had a colonoscopy on 08/29/2013. She reported a history of hemorrhoids and chronic constipation for which she had tried Dulcolax. I referred her back to physical therapy. 4 chronic constipation I asked her to try Linzess. I did not suggest any other new medications. She goes to the [facility] once a year and had an appointment in on 04/27/14 and I reviewed the note and shared the data with them. She had previously tried and failed Sinemet.   Today, she reports having had a few more falls. She has not made another appointment at the [facility] clinic. Stem cell trial was discussed and another trial in [place] with alpha-synuclein depleting therapy. She eats well, her spirits are good.   I saw her on 09/05/2013, at which time she presented for a sooner than scheduled appointment due to recurrent falls. She had fallen 6 times within about 1-1/2 weeks' time. 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 I advised her to use her walker at all times. We talked about potentially going to a motorized wheelchair down the [place]. She was advised to drink more water. She had tried and failed Sinemet in the past. I suggested a second opinion at [facility] which  they declined. I referred her to physical and occupational therapy outpatient. In the interim, I prescribed a light weight wheelchair for her. Her daughter recently died. She had advanced MS.  I first met her on 05/10/2013, at which time we discussed the possibility of MSA. I did not make any medication changes. We talked about the nature of progressive parkinsonism and gait disorder. She had her last outpatient PT in July 2014. She tried to do stretching at home. She had a colonoscopy.  She previously followed with Dr. Morene Antu and was last seen by him on 12/30/2012, at which time he felt that she had worsening of her gait. She also had symptoms of vertigo. She has been on baby aspirin, Synthroid, Crestor, and/or, clonazepam, propranolol, sertraline, trimethoprim, Nasonex, Provera, nitroglycerin as needed, vitamin B12.  She was diagnosed about 20 years ago with essential tremor. She had head tremor more than upper extremity tremor. She has a history of lung disease but was able to take low-dose propranolol. She has not tried primidone. She has had gait and balance problems in the last 5+ years approximately. She has to hold onto something. She has had bladder incontinence since her 80s. She was on Crestor for 3 years which was discontinued in June 2011. CK was normal and EMG nerve conduction studies were normal in June 2011. Crestor was restarted. She has been on B12 injections without subjective improvement. MRI brain with and without contrast showed hyperostosis frontalis, MRI C-spine showed mild degenerative joint disease. 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 Lumbar spine MRI from July 2012 showed mild degenerative disc disease. 4 gait dysfunction she was referred to Dr. Gilford Rile with a question of myelopathy versus orthostatic tremor versus lower half parkinsonism versus essential tremor with gait disorder. 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 Further testing revealed negative tilt table test but there was mild autonomic neuropathy per autonomic reflex screen. She has had swelling in both legs with negative Doppler of the legs and normal echocardiogram. Additional workup at [facility] included vitamin D, paraneoplastic antibodies, anti-GAD, all negative.  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 She has been using a 2 wheeled walker for the past 2 years.  She has been in PT and OT with improvement.  She endorsed a lot of stress, due primarily to her mother's and daughter's health, who died in 02/25/14. Mother is in her 62s and in a NH. She sleeps in a recliner, d/t pain in her trunk, back, neck and stiffness and difficulty getting in and out of bed.   Her Past Medical History Is Significant For: Past Medical History  Diagnosis Date  . GERD (gastroesophageal reflux disease)     s/p nissen  . Familial tremor     followed by Dr. Erling Cruz  . Unspecified chronic bronchitis   . Irritable bladder   . History of pneumonia   . Hypothyroidism   . Depression   . Asthma   . CAD (coronary artery disease)     myoview 5/08:  EF 76%, no scar, no ischemia, +ECG changes with exercise;  cath 04/08/07:  pLAD 30%, mLAD 60-70% - med tx.  Marland Kitchen HLD (hyperlipidemia)   . Multiple system atrophy   . VAGINITIS, ATROPHIC 11/07/2007    Her Past Surgical History Is Significant For: Past Surgical History  Procedure Laterality Date  . Laparoscopic nissen fundoplication    . Cystectomy    . Tumor removal    . Tubal ligation    . Colonoscopy N/A 08/29/2013    int hemm; Inda Castle, MD    Her Family History Is Significant For: Family History  Problem Relation Age of Onset  . Coronary artery disease     . Heart attack    . Hypertension    . Diabetes    . Breast cancer    . Hypertension Mother   . Heart disease Father     Her Social History Is Significant For: History   Social History  . Marital Status: Married    Spouse Name: [name] [name]    Number of Children: 2  . Years of Education: HS   Occupational History  .  Other   Social History Main Topics  . Smoking status: Never Smoker   . Smokeless tobacco: Never Used  . Alcohol Use: No  . Drug Use: No  . Sexual Activity: Not Currently   Other Topics Concern  . None   Social History Narrative   HSG, retired from [employer] . married '60. 1 son - '65; 1 daughter - '61; 2 grandchildren; 1 great-grandchild. Homemaker. marriage in good health. 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 Positive history of passive tobacco smoke exposure.  Caffeine Use: 1 cup daily; 2 glasses of tea daily    Her Allergies Are:  No Known Allergies:   Her Current Medications Are:  Outpatient Encounter Prescriptions as of 07/05/2014  Medication Sig  . AFLURIA PRESERVATIVE FREE injection   . albuterol (PROAIR HFA) 108 (90 BASE) MCG/ACT inhaler Inhale 2 puffs into the lungs every 6 (six) hours as needed for wheezing or shortness of breath.   Marland Kitchen aspirin (ASPIRIN EC) 81 MG EC tablet Take 81 mg by mouth 2 (two) times a week. Swallow whole.  . beclomethasone (QVAR) 80 MCG/ACT inhaler Inhale 1 puff into the lungs daily as needed (wheezing).   . Calcium Carbonate-Vitamin D (CALCIUM-VITAMIN D) 500-200 MG-UNIT per tablet Take 1 tablet by mouth daily.    . clonazePAM (KLONOPIN) 0.5 MG tablet TAKE 1-2 TABLETS BY MOUTH ONCE A DAY AS NEEDED  . cyanocobalamin (,VITAMIN B-12,) 1000 MCG/ML injection Inject 1,000 mcg into the muscle every 30 (thirty) days.    . famotidine (PEPCID) 40 MG tablet Take 40 mg by mouth 2 (two) times daily as needed for heartburn.  . fluocinolone (VANOS) 0.01 % cream Apply 1 application topically 2 (two) times daily as needed (itching).   . furosemide  (LASIX) 20 MG tablet Take 20 mg by mouth daily as needed for fluid or edema. For severe swelling with discomfort.  . hydrOXYzine (ATARAX/VISTARIL) 10 MG tablet Take 10 mg by mouth 3 (three) times daily as needed for itching.  . isosorbide mononitrate (IMDUR) 30 MG 24 hr tablet TAKE 1/2 TABLET BY MOUTH DAILY  . loratadine (CLARITIN) 10 MG tablet Take 10 mg by mouth 2 (two) times daily as needed.   . mometasone (NASONEX) 50 MCG/ACT nasal spray Place 2 sprays into the nose daily as needed (allergies).   . nitroGLYCERIN (NITROSTAT) 0.4 MG SL tablet Place 0.4 mg under the tongue every 5 (five) minutes as needed.    . propranolol (INDERAL) 10 MG tablet Take 10 mg by mouth 3 (three) times daily as needed (tremors).  . rosuvastatin (CRESTOR) 20 MG tablet Take 0.5 tablets (10 mg total) by mouth every evening.  . sertraline (ZOLOFT) 25 MG tablet Take 1 tablet (25 mg total) by mouth every morning.  Marland Kitchen SYNTHROID 100 MCG tablet TAKE 1 TABLET BY MOUTH DAILY.  : Review of Systems:  Out of a complete 14 point review of systems, all are reviewed and negative with the exception of these symptoms as listed below:   Review of Systems  HENT: Positive for hearing loss.   Cardiovascular: Positive for leg swelling.  Musculoskeletal: Positive for gait problem.  Psychiatric/Behavioral: Positive for sleep disturbance (insomnia).    Objective:  Neurologic Exam  Physical Exam Physical Examination:   Filed Vitals:   07/05/14 1450  BP: 123/76  Pulse: 68  Temp: 96 F (35.6 C)   General Examination: The patient is a very pleasant 73 y.o. female in no acute distress. She appears well-developed and well-nourished and well groomed. She brought in her 2 wheeled walker.   HEENT: Normocephalic, atraumatic, pupils are equal, round and reactive to light and accommodation. Extraocular tracking is good without limitation to gaze excursion or nystagmus noted. Saccadic breakdown of smooth pursuit with limitations to upgaze.  Bilateral hearing are in place. Face is symmetric with mild facial masking and normal facial sensation. Speech shows mild voice tremor, with minimal dysarthria noted. There is mild hypophonia. There is a mild head, no-no type tremor. Neck is moderately rigid. There are no carotid bruits on  auscultation. Head is tilted to the R. Oropharynx exam reveals: mild mouth dryness, adequate dental hygiene and mild airway crowding, due to narrow airway. Mallampati is class II.   Chest: Clear to auscultation without wheezing, rhonchi or crackles noted.  Heart: S1+S2+0, regular and normal without murmurs, rubs or gallops noted.   Abdomen: Soft, non-tender and non-distended with normal bowel sounds appreciated on auscultation.  Extremities: There is 1 to 2+ pitting edema in the distal lower extremities bilaterally.   Skin: Warm and dry without trophic changes noted. She has some smaller bruises on her hands.   Musculoskeletal: exam reveals no obvious joint deformities, tenderness or joint swelling or erythema.   Neurologically:  Mental status: The patient is awake, alert and oriented in all 4 spheres. Her memory, attention, language and knowledge are fairly well preserved. There is no aphasia, agnosia, apraxia or anomia. Speech is mildly tremulous and slightly dysarthric with normal prosody and enunciation. Thought process is linear. Mood is congruent and affect is normal.  Cranial nerves are as described above under HEENT exam. In addition, shoulder shrug is normal with equal shoulder height noted. Motor exam: she has thin bulk, 4/5 global strength and tone is increased throughout, moderately, R>L. She has mild myoclonic jerking on the R with stimulus. There is no drift, no resting tremor or rebound. She has a mild action tremor bilaterally. Reflexes are 2+ in the UEs and 3+ in the LEs. Fine motor skills are moderately impaired in the UEs and severely impaired in the LEs, with no significant lateralization,  perhaps a little worse on the R, unchanged.  Cerebellar testing shows no dysmetria or intention tremor on finger to nose testing. There is no truncal or gait ataxia.  Sensory exam is intact to light touch.  Gait, station and balance: I did not have her stand or walk for me today as she did not bring her walker and no walking belt.   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 she is progressing slowly. 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 They understand that there is no specific medication and that the course is invariably progressive. She is barely able to use her walker even with assistance at this time. She has fallen repeatedly. She is primarily confined to her wheelchair. She may get to a point where she needs to use a motorized wheelchair. She is advised to drink plenty of fluids. She is advised to stay active mentally. She is furthermore advised to go through outpatient PT again once her insurance allows for another round of physical therapy. She is advised to call in a couple of months and I will place an order for that. Her main clinic neurologist advised them to call periodically to inquire about potential new treatment options her trials for multiple systems atrophy. I did not suggest any other new medication at this time. She has previously tried and failed Sinemet. Thankfully she is a very positive outlook and her spirits are good. Her husband, on the other hand, has had a hard time accepting her diagnosis. He has multiple questions today which I answered.

## 2014-07-05 NOTE — Patient Instructions (Signed)
We will see you back in 4 months. We will re-start PT in a couple of months, please call so I can make the referral.

## 2014-07-06 MED ORDER — CYANOCOBALAMIN 1000 MCG/ML IJ SOLN: 1000.0000 ug | Freq: Once | INTRAMUSCULAR | Status: DC

## 2014-07-06 MED ORDER — CYANOCOBALAMIN 1000 MCG/ML IJ SOLN
1000.0000 ug | INTRAMUSCULAR | Status: DC
Start: 2014-07-06 — End: 2014-11-27
  Administered 2014-07-05: 1000 ug via INTRAMUSCULAR

## 2014-07-06 NOTE — Progress Notes (Signed)
Pt received B 12 injection during office visit. Under aseptic technique cyanocobalamin 1068mg/1ml IM given R deltoid. Tolerated well. Bandaid applied.

## 2014-07-06 NOTE — Telephone Encounter (Signed)
Noted  

## 2014-08-04 ENCOUNTER — Other Ambulatory Visit: Payer: Self-pay | Admitting: Cardiovascular Disease

## 2014-08-14 ENCOUNTER — Other Ambulatory Visit: Payer: Self-pay

## 2014-08-14 MED ORDER — FAMOTIDINE 40 MG PO TABS: 40.0000 mg | ORAL_TABLET | Freq: Two times a day (BID) | ORAL | Status: DC | PRN

## 2014-09-12 ENCOUNTER — Other Ambulatory Visit: Payer: Self-pay | Admitting: Family Medicine

## 2014-09-13 ENCOUNTER — Telehealth: Payer: Self-pay | Admitting: Neurology

## 2014-09-13 DIAGNOSIS — G903 Multi-system degeneration of the autonomic nervous system: Secondary | ICD-10-CM

## 2014-09-13 DIAGNOSIS — G239 Degenerative disease of basal ganglia, unspecified: Principal | ICD-10-CM

## 2014-09-13 NOTE — Telephone Encounter (Signed)
Order placed for PT through neuro rehab

## 2014-09-13 NOTE — Telephone Encounter (Signed)
Patient requesting PT order for Neuro Rehab, stated 3 mos are up.  Please call and advise.

## 2014-09-17 ENCOUNTER — Telehealth: Payer: Self-pay | Admitting: Gastroenterology

## 2014-09-17 NOTE — Telephone Encounter (Signed)
Patient has had issues of heartburn for about the past 3 weeks. She is taking Pepcid twice daily, adjusted her diet and added Maalox PRN. She continues to have issue. No bowel movement changes. No vomiting. Appointment scheduled for evaluation.

## 2014-09-19 ENCOUNTER — Encounter: Payer: Self-pay | Admitting: Physician Assistant

## 2014-09-19 ENCOUNTER — Ambulatory Visit (INDEPENDENT_AMBULATORY_CARE_PROVIDER_SITE_OTHER): Payer: Medicare Other | Admitting: Physician Assistant

## 2014-09-19 VITALS — BP 130/70 | HR 68 | Ht 62.0 in | Wt 144.8 lb

## 2014-09-19 DIAGNOSIS — K219 Gastro-esophageal reflux disease without esophagitis: Secondary | ICD-10-CM

## 2014-09-19 DIAGNOSIS — K5900 Constipation, unspecified: Secondary | ICD-10-CM

## 2014-09-19 MED ORDER — PANTOPRAZOLE SODIUM 40 MG PO TBEC: 40.0000 mg | DELAYED_RELEASE_TABLET | Freq: Every day | ORAL | Status: DC

## 2014-09-19 NOTE — Progress Notes (Signed)
Patient ID: ASTRA GREGG, female   DOB: 1941/07/30, 73 y.o.   MRN: [id number]     History of Present Illness: This is a follow-up for this pleasant 73 year old female with a history of GERD who underwent a Nissen fundoplication approximately 20 years ago per patient and her husband.  The patient has a rather complicated medical history in that she has had [place] spotted fever at age 63, thyroid disease, asthma, cardiac disease, essential tremor, hearing loss, hyperlipidemia, and multisystem atrophy.  Over the past 1-2 months, she has again began to experience heartburn on a daily basis. She does get some water brash. She has no dysphagia and no odynophagia. She has no epigastric pain, nausea, or vomiting. She denies early satiety and does not feel full for excessively long periods of time. She has been using famotidine twice daily and Mylanta several times per day with only minimal relief. Her appetite has been good and she has been gaining weight but she feels this is due to her limited mobility due to her neurologic disease.  She also states that for the past several weeks her stools have been trying nuggets. She drinks plenty of water. She has tried using a fiber supplement but feels it causes too much gas. She states that she has used Linzess in the past and initially it worked but then caused diarrhea so she discontinued its use. She has tried restarting the Linzess on several occasions but each time it caused diarrhea so she discontinued it. She has had no melena and no bright red blood per rectum.   Past Medical History  Diagnosis Date  . GERD (gastroesophageal reflux disease)     s/p nissen  . Familial tremor     followed by Dr. Erling Cruz  . Unspecified chronic bronchitis   . Irritable bladder   . History of pneumonia   . Hypothyroidism   . Depression   . Asthma   . CAD (coronary artery disease)     myoview 5/08:  EF 76%, no scar, no ischemia, +ECG changes with exercise;  cath  04/08/07:  pLAD 30%, mLAD 60-70% - med tx.  Marland Kitchen HLD (hyperlipidemia)   . Multiple system atrophy     [name]  . VAGINITIS, ATROPHIC 11/07/2007    Past Surgical History  Procedure Laterality Date  . Laparoscopic nissen fundoplication    . Cystectomy    . Tumor removal    . Tubal ligation    . Colonoscopy N/A 08/29/2013    int hemm; Inda Castle, MD   Family History  Problem Relation Age of Onset  . Coronary artery disease    . Heart attack    . Hypertension    . Diabetes    . Breast cancer    . Hypertension Mother   . Heart disease Father    History  Substance Use Topics  . Smoking status: Never Smoker   . Smokeless tobacco: Never Used  . Alcohol Use: No   Current Outpatient Prescriptions  Medication Sig Dispense Refill  . AFLURIA PRESERVATIVE FREE injection     . albuterol (PROAIR HFA) 108 (90 BASE) MCG/ACT inhaler Inhale 2 puffs into the lungs every 6 (six) hours as needed for wheezing or shortness of breath.     Marland Kitchen alum & mag hydroxide-simeth (MYLANTA) 628-638-17 MG/5ML suspension Take 15 mLs by mouth every 6 (six) hours as needed for indigestion or heartburn.    Marland Kitchen aspirin (ASPIRIN EC) 81 MG EC tablet Take  81 mg by mouth 2 (two) times a week. Swallow whole.    . beclomethasone (QVAR) 80 MCG/ACT inhaler Inhale 1 puff into the lungs daily as needed (wheezing).     . Calcium Carbonate-Vitamin D (CALCIUM-VITAMIN D) 500-200 MG-UNIT per tablet Take 1 tablet by mouth daily.      . clonazePAM (KLONOPIN) 0.5 MG tablet TAKE 1-2 TABLETS BY MOUTH ONCE A DAY AS NEEDED 60 tablet 3  . cyanocobalamin (,VITAMIN B-12,) 1000 MCG/ML injection Inject 1,000 mcg into the muscle every 30 (thirty) days.      . famotidine (PEPCID) 40 MG tablet Take 1 tablet (40 mg total) by mouth 2 (two) times daily as needed for heartburn. 180 tablet 1  . fluocinolone (VANOS) 0.01 % cream Apply 1 application topically 2 (two) times daily as needed (itching).     . furosemide (LASIX) 20 MG tablet  Take 20 mg by mouth daily as needed for fluid or edema. For severe swelling with discomfort.    . hydrOXYzine (ATARAX/VISTARIL) 10 MG tablet Take 10 mg by mouth 3 (three) times daily as needed for itching.    . isosorbide mononitrate (IMDUR) 30 MG 24 hr tablet TAKE 1/2 TABLET BY MOUTH DAILY 30 tablet 5  . loratadine (CLARITIN) 10 MG tablet Take 10 mg by mouth 2 (two) times daily as needed.     . mometasone (NASONEX) 50 MCG/ACT nasal spray Place 2 sprays into the nose daily as needed (allergies).     . nitroGLYCERIN (NITROSTAT) 0.4 MG SL tablet Place 0.4 mg under the tongue every 5 (five) minutes as needed.      . propranolol (INDERAL) 10 MG tablet Take 10 mg by mouth 3 (three) times daily as needed (tremors).    . rosuvastatin (CRESTOR) 20 MG tablet Take 0.5 tablets (10 mg total) by mouth every evening. 45 tablet 3  . sertraline (ZOLOFT) 25 MG tablet Take 1 tablet (25 mg total) by mouth every morning. 30 tablet 3  . SYNTHROID 100 MCG tablet TAKE 1 TABLET BY MOUTH DAILY. 30 tablet 3   Current Facility-Administered Medications  Medication Dose Route Frequency Provider Last Rate Last Dose  . cyanocobalamin ((VITAMIN B-12)) injection 1,000 mcg  1,000 mcg Intramuscular Q30 days Star Age, MD   1,000 mcg at 07/05/14 0906   No Known Allergies    Review of Systems: Gen: Denies any fever, chills, sweats, anorexia, fatigue, weakness, malaise, weight loss, and sleep disorder CV: Denies chest pain, angina, palpitations, syncope, orthopnea, PND, peripheral edema, and claudication. Resp: Denies dyspnea at rest, dyspnea with exercise, cough, sputum, wheezing, coughing up blood, and pleurisy. GI: Denies vomiting blood, jaundice, and fecal incontinence.   Denies dysphagia or odynophagia. GU : Denies urinary burning, blood in urine, urinary frequency, urinary hesitancy, nocturnal urination, and urinary incontinence. MS: Denies joint pain, limitation of movement, and swelling, stiffness, low back pain,  extremity pain. Denies muscle weakness, cramps, atrophy.  Derm: Denies rash, itching, dry skin, hives, moles, warts, or unhealing ulcers.  Psych: Denies depression, anxiety, memory loss, suicidal ideation, hallucinations, paranoia, and confusion. Heme: Denies bruising, bleeding, and enlarged lymph nodes. 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 Endo:  Denies any problems with DM, thyroid, adrenal  \  Physical Exam: General: Pleasant, well developed ,female in no acute distress, in a wheelchair Head: Normocephalic and atraumatic Eyes:  sclerae anicteric, conjunctiva pink  Ears: Normal auditory acuity Lungs: Clear throughout to auscultation Heart: Regular rate and rhythm Abdomen: Soft, non distended, non-tender. No masses, no hepatomegaly. Normal bowel sounds Musculoskeletal:  Symmetrical with no gross deformities  Extremities: No edema  Neurological: Alert oriented x 4,mild UE tremor Psychological:  Alert and cooperative. Normal mood and affect  Assessment and Recommendations: #1 GERD. The patient is status post a Nissen fundoplication, however this was 20 years ago per patient. She may possibly have some loosening of the fundoplication and be experiencing recurrent symptoms of GERD. An antireflux regimen has been reviewed with the patient and her husband. She will be given a trial of pantoprazole 40 mg by mouth every morning 30 minutes prior to breakfast and she is to use her famotidine at bedtime.  #2. Constipation. Patient has been advised to increase fluid in her diet she will use Mira lax one capful daily. She was told she can titrate this down to a half capful or titrate up to 2 capfuls a day as needed to produce the desired effect.  She will follow up in 2 months sooner if necessary.   Calib Wadhwa, Vita Barley PA-C 09/19/2014,

## 2014-09-19 NOTE — Patient Instructions (Signed)
Use Miralax 1 capful daily and increase or decrease as needed for constipation We will send your Protonix to your pharmacy- Use every morning 30 minutes before your first meal of the day  Continue to use the Pepcid at bedtime Follow up with Dr Deatra Ina in 2 months ( 11/15/2014 at 8:45am)  Gastroesophageal Reflux Disease, Adult Gastroesophageal reflux disease (GERD) happens when acid from your stomach flows up into the esophagus. When acid comes in contact with the esophagus, the acid causes soreness (inflammation) in the esophagus. Over time, GERD may create small holes (ulcers) in the lining of the esophagus. CAUSES   Increased body weight. This puts pressure on the stomach, making acid rise from the stomach into the esophagus.  Smoking. This increases acid production in the stomach.  Drinking alcohol. This causes decreased pressure in the lower esophageal sphincter (valve or ring of muscle between the esophagus and stomach), allowing acid from the stomach into the esophagus.  Late evening meals and a full stomach. This increases pressure and acid production in the stomach.  A malformed lower esophageal sphincter. Sometimes, no cause is found. SYMPTOMS   Burning pain in the lower part of the mid-chest behind the breastbone and in the mid-stomach area. This may occur twice a week or more often.  Trouble swallowing.  Sore throat.  Dry cough.  Asthma-like symptoms including chest tightness, shortness of breath, or wheezing. DIAGNOSIS  Your caregiver may be able to diagnose GERD based on your symptoms. In some cases, X-rays and other tests may be done to check for complications or to check the condition of your stomach and esophagus. TREATMENT  Your caregiver may recommend over-the-counter or prescription medicines to help decrease acid production. Ask your caregiver before starting or adding any new medicines.  HOME CARE INSTRUCTIONS   Change the factors that you can control. Ask your  caregiver for guidance concerning weight loss, quitting smoking, and alcohol consumption.  Avoid foods and drinks that make your symptoms worse, such as:  Caffeine or alcoholic drinks.  Chocolate.  Peppermint or mint flavorings.  Garlic and onions.  Spicy foods.  Citrus fruits, such as oranges, lemons, or limes.  Tomato-based foods such as sauce, chili, salsa, and pizza.  Fried and fatty foods.  Avoid lying down for the 3 hours prior to your bedtime or prior to taking a nap.  Eat small, frequent meals instead of large meals.  Wear loose-fitting clothing. Do not wear anything tight around your waist that causes pressure on your stomach.  Raise the head of your bed 6 to 8 inches with wood blocks to help you sleep. Extra pillows will not help.  Only take over-the-counter or prescription medicines for pain, discomfort, or fever as directed by your caregiver.  Do not take aspirin, ibuprofen, or other nonsteroidal anti-inflammatory drugs (NSAIDs). SEEK IMMEDIATE MEDICAL CARE IF:   You have pain in your arms, neck, jaw, teeth, or back.  Your pain increases or changes in intensity or duration.  You develop nausea, vomiting, or sweating (diaphoresis).  You develop shortness of breath, or you faint.  Your vomit is green, yellow, black, or looks like coffee grounds or blood.  Your stool is red, bloody, or black. These symptoms could be signs of other problems, such as heart disease, gastric bleeding, or esophageal bleeding. MAKE SURE YOU:   Understand these instructions.  Will watch your condition.  Will get help right away if you are not doing well or get worse. Document Released: 08/05/2005 Document Revised: 01/18/2012  Document Reviewed: 05/15/2011 [facility] Patient Information 2015 [place]. This information is not intended to replace advice given to you by your health care provider. Make sure you discuss any questions you have with your health care  provider.   Food Choices for Gastroesophageal Reflux Disease When you have gastroesophageal reflux disease (GERD), the foods you eat and your eating habits are very important. Choosing the right foods can help ease the discomfort of GERD. WHAT GENERAL GUIDELINES DO I NEED TO FOLLOW?  Choose fruits, vegetables, whole grains, low-fat dairy products, and low-fat meat, fish, and poultry.  Limit fats such as oils, salad dressings, butter, nuts, and avocado.  Keep a food diary to identify foods that cause symptoms.  Avoid foods that cause reflux. These may be different for different people.  Eat frequent small meals instead of three large meals each day.  Eat your meals slowly, in a relaxed setting.  Limit fried foods.  Cook foods using methods other than frying.  Avoid drinking alcohol.  Avoid drinking large amounts of liquids with your meals.  Avoid bending over or lying down until 2-3 hours after eating. WHAT FOODS ARE NOT RECOMMENDED? The following are some foods and drinks that may worsen your symptoms: Vegetables Tomatoes. Tomato juice. Tomato and spaghetti sauce. Chili peppers. Onion and garlic. Horseradish. Fruits Oranges, grapefruit, and lemon (fruit and juice). Meats High-fat meats, fish, and poultry. This includes hot dogs, ribs, ham, sausage, salami, and bacon. Dairy Whole milk and chocolate milk. Sour cream. Cream. Butter. Ice cream. Cream cheese.  Beverages Coffee and tea, with or without caffeine. Carbonated beverages or energy drinks. Condiments Hot sauce. Barbecue sauce.  Sweets/Desserts Chocolate and cocoa. Donuts. Peppermint and spearmint. Fats and Oils High-fat foods, including [place] fries and potato chips. Other Vinegar. Strong spices, such as black pepper, white pepper, red pepper, cayenne, curry powder, cloves, ginger, and chili powder. The items listed above may not be a complete list of foods and beverages to avoid. Contact your dietitian for more  information. Document Released: 10/26/2005 Document Revised: 10/31/2013 Document Reviewed: 08/30/2013 [facility] Patient Information 2015 [place], [place]. This information is not intended to replace advice given to you by your health care provider. Make sure you discuss any questions you have with your health care provider.

## 2014-09-19 NOTE — Progress Notes (Signed)
If not improved with PPI rx would get barium swallow

## 2014-10-02 ENCOUNTER — Other Ambulatory Visit: Payer: Self-pay | Admitting: Family Medicine

## 2014-10-05 ENCOUNTER — Other Ambulatory Visit: Payer: Self-pay | Admitting: Family Medicine

## 2014-10-16 ENCOUNTER — Encounter: Payer: Self-pay | Admitting: Physical Therapy

## 2014-10-16 ENCOUNTER — Ambulatory Visit: Payer: Medicare Other | Attending: Neurology | Admitting: Physical Therapy

## 2014-10-16 DIAGNOSIS — R269 Unspecified abnormalities of gait and mobility: Secondary | ICD-10-CM | POA: Insufficient documentation

## 2014-10-16 DIAGNOSIS — R29898 Other symptoms and signs involving the musculoskeletal system: Secondary | ICD-10-CM | POA: Diagnosis not present

## 2014-10-16 DIAGNOSIS — E785 Hyperlipidemia, unspecified: Secondary | ICD-10-CM | POA: Insufficient documentation

## 2014-10-16 DIAGNOSIS — I2510 Atherosclerotic heart disease of native coronary artery without angina pectoris: Secondary | ICD-10-CM | POA: Insufficient documentation

## 2014-10-16 DIAGNOSIS — R278 Other lack of coordination: Secondary | ICD-10-CM | POA: Diagnosis not present

## 2014-10-16 DIAGNOSIS — K219 Gastro-esophageal reflux disease without esophagitis: Secondary | ICD-10-CM | POA: Insufficient documentation

## 2014-10-16 NOTE — Therapy (Signed)
[facility] [address] [place], [place], [address] Phone: [phone]   Fax:  [phone]  Physical Therapy Evaluation  Patient Details  Name: [name] [name] MRN: [id number] Date of Birth: 04/12/1941  Encounter Date: 10/16/2014      PT End of Session - 10/16/14 2112    Visit Number 1  G1   Number of Visits 9   Date for PT Re-Evaluation 11/15/14   PT Start Time 1315   PT Stop Time 1402   PT Time Calculation (min) 47 min   Equipment Utilized During Treatment Gait belt      Past Medical History  Diagnosis Date  . GERD (gastroesophageal reflux disease)     s/p nissen  . Familial tremor     followed by Dr. Erling Cruz  . Unspecified chronic bronchitis   . Irritable bladder   . History of pneumonia   . Hypothyroidism   . Depression   . Asthma   . CAD (coronary artery disease)     myoview 5/08:  EF 76%, no scar, no ischemia, +ECG changes with exercise;  cath 04/08/07:  pLAD 30%, mLAD 60-70% - med tx.  [name] HLD (hyperlipidemia)   . Multiple system atrophy     [name]  . VAGINITIS, ATROPHIC 11/07/2007    Past Surgical History  Procedure Laterality Date  . Laparoscopic nissen fundoplication    . Cystectomy    . Tumor removal    . Tubal ligation    . Colonoscopy N/A 08/29/2013    int hemm; Inda Castle, MD    There were no vitals taken for this visit.  Visit Diagnosis:  Abnormality of gait - Plan: PT plan of care cert/re-cert  Right leg weakness - Plan: PT plan of care cert/re-cert  Incoordination of extremity - Plan: PT plan of care cert/re-cert      Subjective Assessment - 10/16/14 1326    Symptoms Pt. is a 73 year old lady with [place] diagnosed at [name] in Sept. 2013 (suspected) and confirmed in May 2014; pt. was discharged from this facility 4 months ago; pt. reports decline in mobility   Currently in Pain? Yes   Pain Score --  varies 0-10   Pain Location Leg   Pain Orientation Right   Pain  Descriptors / Indicators Throbbing   Pain Type Chronic pain   Pain Onset More than a month ago   Pain Frequency Several days a week   Aggravating Factors  weight- bearing for prolonged periods of time   Pain Relieving Factors aspercreme   Multiple Pain Sites Yes   Pain Score --  0-10 varies in intensity   Pain Type Chronic pain   Pain Location Hip   Pain Orientation Right   Pain Descriptors / Indicators Throbbing   Pain Frequency Several days a week   Pain Onset With Activity          [name] PT Assessment - 10/16/14 1333    Assessment   Medical Diagnosis Multiple systems atrophy   Onset Date --  2012   Next MD Visit Jan 2016   Prior Therapy pt. had OP PT  for 2 months  in summer 2015   Balance Screen   Has the patient fallen in the past 6 months Yes   How many times? 4   Has the patient had a decrease in activity level because of a fear of falling?  Yes   Is the patient reluctant to leave their  home because of a fear of falling?  No   Home Environment   Living Enviornment Private residence   Home Access Ramped entrance  back entrance   Transfers   Transfers Sit to Stand  min to [name]   Ambulation/Gait   Ambulation/Gait Yes   Ambulation/Gait Assistance 4: Min assist   Ambulation/Gait Assistance Details increased trunk flexion with decr. step length   Ambulation Distance (Feet) 240 Feet   Assistive device Rolling walker   Gait Pattern Step-to pattern;Decreased step length - right;Decreased step length - left;Trunk flexed   Balance   Balance Assessed Yes   Static Standing Balance   Static Standing - Balance Support Bilateral upper extremity supported  pt. requires SBA with UE support for safety    pt. Is unable to stand unsupported; sitting balance is WNL's          PT Short Term Goals - 10/16/14 2115    PT SHORT TERM GOAL #1   Title Pt. will increase standing tolerance so she is able to report standing 5" at home with S for independence with ADL's.   Baseline  11-15-14   Time 4   Period Weeks   Status New   PT SHORT TERM GOAL #2   Title Increase distance in 3" walk test by at least 50' for increased amb. speed and endurance.   Baseline 11-15-14   Time 4   Period Weeks   Status New   PT SHORT TERM GOAL #3   Title Transfer sit to stand from wheelchair with CGA   Baseline 11-15-14  (min A needed 10-16-14)   Time 4   Period Weeks   Status New   PT SHORT TERM GOAL #4   Title Independent in HEP    Baseline 11-15-14   Time 4   Period Weeks   Status New          PT Long Term Goals - 10/16/14 2121    PT LONG TERM GOAL #1   Title Pt. will ambulate 360' nonstop with RW to demo incr. endurance with CGA   Baseline 240' with min A    11-15-14   Time 8   Period Weeks   Status New   PT LONG TERM GOAL #2   Title Report ability to stand at least 10" at home for improved standing tolerance   Baseline 3-4" with c/o pain in legs   11-15-14   Time 8   Period Weeks   Status New   PT LONG TERM GOAL #3   Title Report at least 25% less pain in RLE with weight-bearing   Baseline 11-15-14   Time 8   Period Weeks   Status New   PT LONG TERM GOAL #4   Title Perform at least 10" on Nustep to demo improved activity tolerance/endurance   Baseline 11-15-14   Time 8   Period Weeks   Status New          Plan - 10/16/14 2112    Clinical Impression Statement pt. has progressive decline due to [place] disease process; needs SBA to CGA for mobility for safety   Pt will benefit from skilled therapeutic intervention in order to improve on the following deficits Abnormal gait;Decreased coordination;Impaired flexibility;Decreased endurance;Postural dysfunction;Decreased activity tolerance;Decreased balance;Pain;Decreased mobility;Decreased strength   Rehab Potential Fair   PT Frequency 2x / week   PT Duration 8 weeks   PT Treatment/Interventions ADLs/Self Care Home Management;Therapeutic activities;Patient/family education;Therapeutic exercise;Gait training;Balance  training;Neuromuscular re-education;Stair training;Functional mobility training  PT Next Visit Plan do 3" walk test; begin balance exercises   PT Home Exercise Plan supine exercises for strengthening   Consulted and Agree with Plan of Care Patient          G-Codes - 10-19-2014 01-06-26    Functional Assessment Tool Used clinical judgment   Functional Limitation Mobility: Walking and moving around   Mobility: Walking and Moving Around Current Status [phone]) At least 80 percent but less than 100 percent impaired, limited or restricted   Mobility: Walking and Moving Around Goal Status [phone]) At least 60 percent but less than 80 percent impaired, limited or restricted                            Problem List Patient Active Problem List   Diagnosis Date Noted  . B-complex deficiency 06/07/2014  . Recurrent falls 02/06/2014  . Multiple system atrophy   . Dysphagia, unspecified(787.20) 07/28/2013  . Lower extremity edema 09/26/2012  . Shoulder pain, left 07/20/2012  . 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 Routine health maintenance 08/26/2011  . Murmur 05/27/2011  . HYPERCHOLESTEROLEMIA  IIA 05/14/2010  . VENOUS INSUFFICIENCY, LEGS 06/21/2009  . CORONARY HEART DISEASE 06/09/2008  . HYPOTHYROIDISM 05/15/2008  . ASTHMA 05/15/2008  . DEPRESSION 11/07/2007  . 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 GERD 11/07/2007  . VAGINITIS, ATROPHIC 11/07/2007    Alda Lea October 19, 2014, 9:36 PM   [facility] [address], [place], [place] [address] [phone]

## 2014-10-18 ENCOUNTER — Encounter: Payer: Self-pay | Admitting: Physical Therapy

## 2014-10-18 ENCOUNTER — Ambulatory Visit: Payer: Medicare Other | Admitting: Physical Therapy

## 2014-10-18 DIAGNOSIS — R29898 Other symptoms and signs involving the musculoskeletal system: Secondary | ICD-10-CM

## 2014-10-18 DIAGNOSIS — R269 Unspecified abnormalities of gait and mobility: Secondary | ICD-10-CM | POA: Diagnosis not present

## 2014-10-18 DIAGNOSIS — R278 Other lack of coordination: Secondary | ICD-10-CM

## 2014-10-18 NOTE — Therapy (Signed)
[facility] [address] [place], [place], [address] Phone: [phone]   Fax:  [phone]  Physical Therapy Treatment  Patient Details  Name: [name] [name] MRN: [id number] Date of Birth: 16-Feb-1941  Encounter Date: 10/18/2014      PT End of Session - 10/18/14 1654    Visit Number 2   Number of Visits 17   Date for PT Re-Evaluation 11/15/14   PT Start Time 1404   PT Stop Time 1449   PT Time Calculation (min) 45 min   Equipment Utilized During Treatment Gait belt   Activity Tolerance Patient tolerated treatment well   Behavior During Therapy [facility] for tasks assessed/performed      Past Medical History  Diagnosis Date  . GERD (gastroesophageal reflux disease)     s/p nissen  . Familial tremor     followed by Dr. Erling Cruz  . Unspecified chronic bronchitis   . Irritable bladder   . History of pneumonia   . Hypothyroidism   . Depression   . Asthma   . CAD (coronary artery disease)     myoview 5/08:  EF 76%, no scar, no ischemia, +ECG changes with exercise;  cath 04/08/07:  pLAD 30%, mLAD 60-70% - med tx.  Marland Kitchen HLD (hyperlipidemia)   . Multiple system atrophy     [name]  . VAGINITIS, ATROPHIC 11/07/2007    Past Surgical History  Procedure Laterality Date  . Laparoscopic nissen fundoplication    . Cystectomy    . Tumor removal    . Tubal ligation    . Colonoscopy N/A 08/29/2013    int hemm; Inda Castle, MD    There were no vitals taken for this visit.  Visit Diagnosis:  Abnormality of gait  Right leg weakness  Incoordination of extremity      Subjective Assessment - 10/18/14 1413    Symptoms Pt reports feeling nauseated this morning and fatigued.  Nausea has resolved now.  Having increase complications with digestive system lately.   Currently in Pain? No/denies         [facility] Adult PT Treatment/Exercise - 10/18/14 1645    Transfers   Transfers Sit to Stand;Stand to Sit   Sit to Stand 4:  Min assist   Sit to Stand Details (indicate cue type and reason) assist to keep RLE on floor with transition   Stand to Sit 4: Min assist   Stand to Sit Details cues to reach back and back completely up to chair   Ambulation/Gait   Ambulation/Gait Yes   Ambulation/Gait Assistance 4: Min assist   Ambulation/Gait Assistance Details decreased step length, tends to push RW too far forward   Ambulation Distance (Feet) 240 Feet   Assistive device Rolling walker   Gait Pattern Step-to pattern;Decreased step length - right;Decreased step length - left;Trunk flexed   Gait velocity 3 minute walk test 110'   Chief Technology Officer Yes   Wheelchair Assistance 5: Horticulturist, commercial for Paediatric nurse Both upper extremities   Wheelchair Parts Management Independent  for brakes;not using legrests today   Distance 100 x 2   Knee/Hip Exercises: Seated   Long Arc Quad AROM;Both;1 set;10 reps   Other Seated Knee Exercises seated bil hip flexion x 10, seated bil SAQ x 10   Other Seated Knee Exercises seated heel raises and toe raises x 10  PT Education - 10/18/14 1654    Education provided Yes   Education Details HEP   Person(s) Educated Patient   Methods Explanation;Demonstration;Handout   Comprehension Verbalized understanding            Plan - 10/18/14 1655    Clinical Impression Statement Pt motivated to improve mobility.  Initiated new HEP today from seated position.  Pt has not been doing previous HEP supine because of difficulty with bed mobility and sleeps in recliner.   Pt will benefit from skilled therapeutic intervention in order to improve on the following deficits Abnormal gait;Decreased coordination;Impaired flexibility;Decreased endurance;Postural dysfunction;Decreased activity tolerance;Decreased balance;Pain;Decreased mobility;Decreased strength   Rehab Potential Fair   PT  Frequency 2x / week   PT Duration 8 weeks   PT Next Visit Plan review seated HEP.  Provide standing exercise at sink of marching, heel raise, toe raise and add additional if able.   Consulted and Agree with Plan of Care Patient        Problem List Patient Active Problem List   Diagnosis Date Noted  . B-complex deficiency 06/07/2014  . Recurrent falls 02/06/2014  . Multiple system atrophy   . Dysphagia, unspecified(787.20) 07/28/2013  . Lower extremity edema 09/26/2012  . Shoulder pain, left 07/20/2012  . 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 Routine health maintenance 08/26/2011  . Murmur 05/27/2011  . HYPERCHOLESTEROLEMIA  IIA 05/14/2010  . VENOUS INSUFFICIENCY, LEGS 06/21/2009  . CORONARY HEART DISEASE 06/09/2008  . HYPOTHYROIDISM 05/15/2008  . ASTHMA 05/15/2008  . DEPRESSION 11/07/2007  . 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 GERD 11/07/2007  . VAGINITIS, ATROPHIC 11/07/2007    [name] 10/18/2014, 4:59 PM     [name], [place] 10/18/2014 4:59 PM Phone: [phone] Fax: [phone]

## 2014-10-18 NOTE — Patient Instructions (Signed)
KNEE: Extension, Long Arc Quads - Sitting   Raise leg until knee is straight. 10 reps per set, 1 sets per day, 2 times per day.  Copyright  VHI. All rights reserved.  FLEXION: Sitting (Active)   Sit, both feet flat. Lift right knee toward ceiling. Complete 1 sets of 10 repetitions. Perform 2 sessions per day.  Copyright  VHI. All rights reserved.  Strengthening: Quadriceps Set   Tighten muscles on top of thighs by pushing knees down into surface. Hold 5 seconds. Repeat 10 times per set. Do 1 sets per session. Do 2 sessions per day.  http://orth.exer.us/603   Copyright  VHI. All rights reserved.

## 2014-10-21 ENCOUNTER — Other Ambulatory Visit: Payer: Self-pay | Admitting: Family Medicine

## 2014-10-23 ENCOUNTER — Encounter: Payer: Self-pay | Admitting: Physical Therapy

## 2014-10-23 ENCOUNTER — Ambulatory Visit: Payer: Medicare Other | Admitting: Physical Therapy

## 2014-10-23 DIAGNOSIS — R29898 Other symptoms and signs involving the musculoskeletal system: Secondary | ICD-10-CM

## 2014-10-23 DIAGNOSIS — R278 Other lack of coordination: Secondary | ICD-10-CM

## 2014-10-23 DIAGNOSIS — R269 Unspecified abnormalities of gait and mobility: Secondary | ICD-10-CM | POA: Diagnosis not present

## 2014-10-23 NOTE — Therapy (Signed)
[facility] [address] [place], [place], [address] Phone: [phone]   Fax:  [phone]  Physical Therapy Treatment  Patient Details  Name: [name] [name] MRN: [id number] Date of Birth: 04-16-1941  Encounter Date: 10/23/2014      PT End of Session - 10/23/14 2059    Visit Number 3   Number of Visits 17   Date for PT Re-Evaluation 11/15/14   PT Start Time 1534   PT Stop Time 1620   PT Time Calculation (min) 46 min   Equipment Utilized During Treatment Gait belt   Activity Tolerance Patient tolerated treatment well   Behavior During Therapy [facility] for tasks assessed/performed      Past Medical History  Diagnosis Date  . GERD (gastroesophageal reflux disease)     s/p nissen  . Familial tremor     followed by Dr. Erling Cruz  . Unspecified chronic bronchitis   . Irritable bladder   . History of pneumonia   . Hypothyroidism   . Depression   . Asthma   . CAD (coronary artery disease)     myoview 5/08:  EF 76%, no scar, no ischemia, +ECG changes with exercise;  cath 04/08/07:  pLAD 30%, mLAD 60-70% - med tx.  Marland Kitchen HLD (hyperlipidemia)   . Multiple system atrophy     [name]  . VAGINITIS, ATROPHIC 11/07/2007    Past Surgical History  Procedure Laterality Date  . Laparoscopic nissen fundoplication    . Cystectomy    . Tumor removal    . Tubal ligation    . Colonoscopy N/A 08/29/2013    int hemm; Inda Castle, MD    There were no vitals taken for this visit.  Visit Diagnosis:  Abnormality of gait  Right leg weakness  Incoordination of extremity      Subjective Assessment - 10/23/14 1541    Symptoms Denies falls or changes.     Currently in Pain? No/denies          [facility] Adult PT Treatment/Exercise - 10/23/14 2053    Transfers   Transfers Sit to Stand;Stand to Sit   Sit to Stand 4: Min assist   Sit to Stand Details (indicate cue type and reason) assist to steady RLE onto floor with transition    Stand to Sit 4: Min assist   Stand to Sit Details cues for safety   Ambulation/Gait   Ambulation/Gait Yes   Ambulation/Gait Assistance 4: Min assist   Ambulation/Gait Assistance Details tends to push RW too far forward and has difficulty with RLE placement at times.  Needs assist to guide RW at times.  Ambulated on level outdoor concrete and iindoors.   Ambulation Distance (Feet) 500 Feet  50'   Assistive device Rolling walker   Gait Pattern Step-to pattern;Decreased step length - right;Decreased step length - left;Trunk flexed   Knee/Hip Exercises: Aerobic   Stationary Bike Scifit level 2.0 all 4 extremities x 10 minutes.  Needs to use stool to scoot onto chair.   Knee/Hip Exercises: Standing   Heel Raises 1 set;20 reps;Other (comment)  holding onto counter/sink   Knee Flexion AROM;Both;1 set;10 reps;Other (comment)  holding onto counter/sink   Other Standing Knee Exercises bil hip abd standing at sink/counter x 10.  Has decrease AROM on RLE.   Knee/Hip Exercises: Seated   Long Arc Quad AROM;Both;5 reps   Other Seated Knee Exercises seated bil hip flexion x 5, seated bil SAQ x 5  Plan - 10/23/14 2100    Clinical Impression Statement Pt able to increase ambulation distance today and on level outdoor concrete.  Continues to be extremely motivated to improve mobility.  Performing seated HEP as provided.   Pt will benefit from skilled therapeutic intervention in order to improve on the following deficits Abnormal gait;Decreased coordination;Impaired flexibility;Decreased endurance;Postural dysfunction;Decreased activity tolerance;Decreased balance;Pain;Decreased mobility;Decreased strength   Rehab Potential Fair  given diagnosis   PT Frequency 2x / week   PT Duration 8 weeks   PT Treatment/Interventions ADLs/Self Care Home Management;Therapeutic activities;Patient/family education;Therapeutic exercise;Gait training;Balance training;Neuromuscular re-education;Stair  training;Functional mobility training   PT Next Visit Plan Scifit for endurance/strength, Gait and ther ex.   Consulted and Agree with Plan of Care Patient       Problem List Patient Active Problem List   Diagnosis Date Noted  . B-complex deficiency 06/07/2014  . Recurrent falls 02/06/2014  . Multiple system atrophy   . Dysphagia, unspecified(787.20) 07/28/2013  . Lower extremity edema 09/26/2012  . Shoulder pain, left 07/20/2012  . 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 Routine health maintenance 08/26/2011  . Murmur 05/27/2011  . HYPERCHOLESTEROLEMIA  IIA 05/14/2010  . VENOUS INSUFFICIENCY, LEGS 06/21/2009  . CORONARY HEART DISEASE 06/09/2008  . HYPOTHYROIDISM 05/15/2008  . ASTHMA 05/15/2008  . DEPRESSION 11/07/2007  . 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 GERD 11/07/2007  . VAGINITIS, ATROPHIC 11/07/2007    [name] 10/23/2014, 9:05 PM     [name], [place] 10/23/2014 9:05 PM Phone: [phone] Fax: [phone]

## 2014-10-25 ENCOUNTER — Ambulatory Visit: Payer: Medicare Other | Admitting: Physical Therapy

## 2014-10-30 ENCOUNTER — Ambulatory Visit: Payer: Medicare Other | Admitting: Physical Therapy

## 2014-10-30 ENCOUNTER — Encounter: Payer: Self-pay | Admitting: Physical Therapy

## 2014-10-30 DIAGNOSIS — R29898 Other symptoms and signs involving the musculoskeletal system: Secondary | ICD-10-CM

## 2014-10-30 DIAGNOSIS — R269 Unspecified abnormalities of gait and mobility: Secondary | ICD-10-CM

## 2014-10-30 NOTE — Therapy (Signed)
[facility] [address] [place], [place], [address] Phone: [phone]   Fax:  [phone]  Physical Therapy Treatment  Patient Details  Name: [name] [name] MRN: [id number] Date of Birth: 12-21-1940  Encounter Date: 10/30/2014      PT End of Session - 10/30/14 2104    Visit Number 4  G4   Number of Visits 17   Date for PT Re-Evaluation 11/15/14   PT Start Time 1320   PT Stop Time 1416   PT Time Calculation (min) 56 min   Equipment Utilized During Treatment Gait belt      Past Medical History  Diagnosis Date  . GERD (gastroesophageal reflux disease)     s/p nissen  . Familial tremor     followed by Dr. Erling Cruz  . Unspecified chronic bronchitis   . Irritable bladder   . History of pneumonia   . Hypothyroidism   . Depression   . Asthma   . CAD (coronary artery disease)     myoview 5/08:  EF 76%, no scar, no ischemia, +ECG changes with exercise;  cath 04/08/07:  pLAD 30%, mLAD 60-70% - med tx.  Marland Kitchen HLD (hyperlipidemia)   . Multiple system atrophy     [name]  . VAGINITIS, ATROPHIC 11/07/2007    Past Surgical History  Procedure Laterality Date  . Laparoscopic nissen fundoplication    . Cystectomy    . Tumor removal    . Tubal ligation    . Colonoscopy N/A 08/29/2013    int hemm; Inda Castle, MD    There were no vitals taken for this visit.  Visit Diagnosis:  Abnormality of gait  Right leg weakness      Subjective Assessment - 10/30/14 2056    Symptoms Pt. reports ambulating at home with husband's assistance   Currently in Pain? No/denies                    [facility] Adult PT Treatment/Exercise - 10/30/14 1328    Transfers   Transfers Sit to Stand   Sit to Stand 4: Min assist;With upper extremity assist  5 reps performed from chair with arm rests to RW   Ambulation/Gait   Ambulation/Gait Yes   Ambulation/Gait Assistance 4: Min guard   Ambulation/Gait Assistance  Details pt. has some mild difficulty with negotiating turns on track   Ambulation Distance (Feet) 240 Feet   Assistive device Rolling walker   Gait Pattern Decreased step length - right;Step-to pattern;Trunk flexed   High Level Balance   High Level Balance Activities Side stepping  crossovers front with L foot over RLE with mod assist   High Level Balance Comments Pt. unable to cross RLE over LLE for crossover balance activity due to R foot staying up in air and pt haiving difficulty getting foot on floor   Knee/Hip Exercises: Aerobic   Stationary Bike Scifit level 1.5 x 15" with bil. UE's and LE's (no charge as unsupervised)   Knee/Hip Exercises: Standing   Other Standing Knee Exercises pt. performed standing bil hip flexion 10 reps each leg and hip abduction 10 reps each leg with bil. UE support     Neuro Re-ed: standing static inside parallel bars with CGA with UE support as needed; BIG exercises including reaching down to floor with UE's up overhead x 10 reps, Trunk rotation seated x 10 reps right and left sides; marching in place with bil. UE's x 10 reps with bil.  UE support             PT Short Term Goals - 10/16/14 2115    PT SHORT TERM GOAL #1   Title Pt. will increase standing tolerance so she is able to report standing 5" at home with S for independence with ADL's.   Baseline 11-15-14   Time 4   Period Weeks   Status New   PT SHORT TERM GOAL #2   Title Increase distance in 3" walk test by at least 50' for increased amb. speed and endurance.   Baseline 11-15-14   Time 4   Period Weeks   Status New   PT SHORT TERM GOAL #3   Title Transfer sit to stand from wheelchair with CGA   Baseline 11-15-14  (min A needed 10-16-14)   Time 4   Period Weeks   Status New   PT SHORT TERM GOAL #4   Title Independent in HEP    Baseline 11-15-14   Time 4   Period Weeks   Status New           PT Long Term Goals - 10/16/14 2121    PT LONG TERM GOAL #1   Title Pt. will ambulate  360' nonstop with RW to demo incr. endurance with CGA   Baseline 240' with min A    11-15-14   Time 8   Period Weeks   Status New   PT LONG TERM GOAL #2   Title Report ability to stand at least 10" at home for improved standing tolerance   Baseline 3-4" with c/o pain in legs   11-15-14   Time 8   Period Weeks   Status New   PT LONG TERM GOAL #3   Title Report at least 25% less pain in RLE with weight-bearing   Baseline 11-15-14   Time 8   Period Weeks   Status New   PT LONG TERM GOAL #4   Title Perform at least 10" on Nustep to demo improved activity tolerance/endurance   Baseline 11-15-14   Time 8   Period Weeks   Status New               Plan - 10/30/14 2105    Clinical Impression Statement pt. has difficulty with initiation of RLE with turns on track - hesitation noted with transitional change in path;  also noted decr. motor control and decr. coordination RLE   Pt will benefit from skilled therapeutic intervention in order to improve on the following deficits Abnormal gait;Decreased coordination;Impaired flexibility;Decreased endurance;Postural dysfunction;Decreased activity tolerance;Decreased balance;Pain;Decreased mobility;Decreased strength   Rehab Potential Fair   PT Frequency 2x / week   PT Duration 8 weeks   PT Treatment/Interventions ADLs/Self Care Home Management;Therapeutic activities;Patient/family education;Therapeutic exercise;Gait training;Balance training;Neuromuscular re-education;Stair training;Functional mobility training   PT Next Visit Plan cont gait and balance training   Consulted and Agree with Plan of Care Patient        Problem List Patient Active Problem List   Diagnosis Date Noted  . B-complex deficiency 06/07/2014  . Recurrent falls 02/06/2014  . Multiple system atrophy   . Dysphagia, unspecified(787.20) 07/28/2013  . Lower extremity edema 09/26/2012  . Shoulder pain, left 07/20/2012  . 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 Routine health  maintenance 08/26/2011  . Murmur 05/27/2011  . HYPERCHOLESTEROLEMIA  IIA 05/14/2010  . VENOUS INSUFFICIENCY, LEGS 06/21/2009  . CORONARY HEART DISEASE 06/09/2008  . HYPOTHYROIDISM 05/15/2008  . ASTHMA 05/15/2008  . DEPRESSION 11/07/2007  .  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 GERD 11/07/2007  . VAGINITIS, ATROPHIC 11/07/2007    Alda Lea, PT 10/30/2014, 9:08 PM  [facility] [address] [place], [place], [address] Phone: [phone]   Fax:  [phone]

## 2014-11-06 ENCOUNTER — Ambulatory Visit: Payer: Medicare Other | Admitting: Physical Therapy

## 2014-11-06 ENCOUNTER — Encounter: Payer: Self-pay | Admitting: Physical Therapy

## 2014-11-06 DIAGNOSIS — R269 Unspecified abnormalities of gait and mobility: Secondary | ICD-10-CM | POA: Diagnosis not present

## 2014-11-06 DIAGNOSIS — R29898 Other symptoms and signs involving the musculoskeletal system: Secondary | ICD-10-CM

## 2014-11-06 NOTE — Therapy (Signed)
[facility] [address] [place], [place], [address] Phone: [phone]   Fax:  [phone]  Physical Therapy Treatment  Patient Details  Name: [name] [name] MRN: [id number] Date of Birth: 12-29-1940  Encounter Date: 11/06/2014      PT End of Session - 11/06/14 2129    Visit Number 5   Number of Visits 17   Date for PT Re-Evaluation 11/15/14   PT Start Time 1148   PT Stop Time 1250   PT Time Calculation (min) 62 min   Equipment Utilized During Treatment Gait belt      Past Medical History  Diagnosis Date  . GERD (gastroesophageal reflux disease)     s/p nissen  . Familial tremor     followed by Dr. Erling Cruz  . Unspecified chronic bronchitis   . Irritable bladder   . History of pneumonia   . Hypothyroidism   . Depression   . Asthma   . CAD (coronary artery disease)     myoview 5/08:  EF 76%, no scar, no ischemia, +ECG changes with exercise;  cath 04/08/07:  pLAD 30%, mLAD 60-70% - med tx.  Marland Kitchen HLD (hyperlipidemia)   . Multiple system atrophy     [name]  . VAGINITIS, ATROPHIC 11/07/2007    Past Surgical History  Procedure Laterality Date  . Laparoscopic nissen fundoplication    . Cystectomy    . Tumor removal    . Tubal ligation    . Colonoscopy N/A 08/29/2013    int hemm; Inda Castle, MD    There were no vitals taken for this visit.  Visit Diagnosis:  No diagnosis found.      Subjective Assessment - 11/06/14 2125    Symptoms Pt. denies any changes since last week   Currently in Pain? No/denies                    [facility] Adult PT Treatment/Exercise - 11/06/14 1210    Transfers   Sit to Stand 4: Min guard   Ambulation/Gait   Ambulation/Gait Yes   Ambulation/Gait Assistance 4: Min guard   Ambulation/Gait Assistance Details incr. trunk flexion with decr. step length bil. LE's   Ambulation Distance (Feet) 240 Feet   Assistive device Rolling walker   Gait Pattern  Step-to pattern;Decreased step length - right;Decreased step length - left   Knee/Hip Exercises: Aerobic   Stationary Bike Scifit level 1.5 x 15" with bil. UE's and LE's (no charge as unsupervised)   Knee/Hip Exercises: Standing   Heel Raises 10 reps    Pt. Performed BIG exercises seated - reaching down to floor with UE's up toward ceiling x 10 reps; seated with trunk rotation hand horizontally adducting to clap opposite hand x 10 reps each; seated diagonal reaching down to floor and up to opposite side x 10 reps each Sit to stand x 5 reps with 1 UE support Sidestepping and backwards amb. Inside bars 10' x 4 reps with UE support Alternate tap ups to 4" step 10 reps each with mod to max assist with UE support             PT Short Term Goals - 10/16/14 2115    PT SHORT TERM GOAL #1   Title Pt. will increase standing tolerance so she is able to report standing 5" at home with S for independence with ADL's.   Baseline 11-15-14   Time 4   Period Weeks  Status New   PT SHORT TERM GOAL #2   Title Increase distance in 3" walk test by at least 50' for increased amb. speed and endurance.   Baseline 11-15-14   Time 4   Period Weeks   Status New   PT SHORT TERM GOAL #3   Title Transfer sit to stand from wheelchair with CGA   Baseline 11-15-14  (min A needed 10-16-14)   Time 4   Period Weeks   Status New   PT SHORT TERM GOAL #4   Title Independent in HEP    Baseline 11-15-14   Time 4   Period Weeks   Status New           PT Long Term Goals - 10/16/14 2121    PT LONG TERM GOAL #1   Title Pt. will ambulate 360' nonstop with RW to demo incr. endurance with CGA   Baseline 240' with min A    11-15-14   Time 8   Period Weeks   Status New   PT LONG TERM GOAL #2   Title Report ability to stand at least 10" at home for improved standing tolerance   Baseline 3-4" with c/o pain in legs   11-15-14   Time 8   Period Weeks   Status New   PT LONG TERM GOAL #3   Title Report at least 25% less  pain in RLE with weight-bearing   Baseline 11-15-14   Time 8   Period Weeks   Status New   PT LONG TERM GOAL #4   Title Perform at least 10" on Nustep to demo improved activity tolerance/endurance   Baseline 11-15-14   Time 8   Period Weeks   Status New               Plan - 11/06/14 2130    Clinical Impression Statement pt. has decr. initiation with transitional surfaces and with negotiating curve on track; had approx. 3-4 freezing episodes during 240' amb. distance on track   Pt will benefit from skilled therapeutic intervention in order to improve on the following deficits Abnormal gait;Decreased coordination;Impaired flexibility;Decreased endurance;Postural dysfunction;Decreased activity tolerance;Decreased balance;Pain;Decreased mobility;Decreased strength   Rehab Potential Fair   PT Frequency 2x / week   PT Duration 8 weeks   PT Treatment/Interventions ADLs/Self Care Home Management;Therapeutic activities;Patient/family education;Therapeutic exercise;Gait training;Balance training;Neuromuscular re-education;Stair training;Functional mobility training   PT Next Visit Plan cont gait and balance training   Consulted and Agree with Plan of Care Patient        Problem List Patient Active Problem List   Diagnosis Date Noted  . B-complex deficiency 06/07/2014  . Recurrent falls 02/06/2014  . Multiple system atrophy   . Dysphagia, unspecified(787.20) 07/28/2013  . Lower extremity edema 09/26/2012  . Shoulder pain, left 07/20/2012  . 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 Routine health maintenance 08/26/2011  . Murmur 05/27/2011  . HYPERCHOLESTEROLEMIA  IIA 05/14/2010  . VENOUS INSUFFICIENCY, LEGS 06/21/2009  . CORONARY HEART DISEASE 06/09/2008  . HYPOTHYROIDISM 05/15/2008  . ASTHMA 05/15/2008  . DEPRESSION 11/07/2007  . 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 GERD 11/07/2007  . VAGINITIS, ATROPHIC 11/07/2007    Alda Lea, PT 11/06/2014, 9:33 PM  [facility] [address] [place], [place], [address] Phone: [phone]   Fax:  [phone]

## 2014-11-08 ENCOUNTER — Ambulatory Visit: Payer: Medicare Other | Admitting: Physical Therapy

## 2014-11-08 ENCOUNTER — Encounter: Payer: Self-pay | Admitting: Physical Therapy

## 2014-11-08 DIAGNOSIS — R269 Unspecified abnormalities of gait and mobility: Secondary | ICD-10-CM

## 2014-11-08 DIAGNOSIS — R278 Other lack of coordination: Secondary | ICD-10-CM

## 2014-11-08 DIAGNOSIS — R29898 Other symptoms and signs involving the musculoskeletal system: Secondary | ICD-10-CM

## 2014-11-08 NOTE — Therapy (Signed)
[facility] [address] [place], [place], [address] Phone: [phone]   Fax:  [phone]  Physical Therapy Treatment  Patient Details  Name: [name] [name] MRN: [id number] Date of Birth: Sep 12, 1941  Encounter Date: 11/08/2014      PT End of Session - 11/08/14 1628    Visit Number 6   Number of Visits 17   Date for PT Re-Evaluation 11/15/14   PT Start Time 7262   PT Stop Time 1619   PT Time Calculation (min) 46 min   Equipment Utilized During Treatment Gait belt   Activity Tolerance Patient tolerated treatment well   Behavior During Therapy [facility] for tasks assessed/performed      Past Medical History  Diagnosis Date  . GERD (gastroesophageal reflux disease)     s/p nissen  . Familial tremor     followed by Dr. Erling Cruz  . Unspecified chronic bronchitis   . Irritable bladder   . History of pneumonia   . Hypothyroidism   . Depression   . Asthma   . CAD (coronary artery disease)     myoview 5/08:  EF 76%, no scar, no ischemia, +ECG changes with exercise;  cath 04/08/07:  pLAD 30%, mLAD 60-70% - med tx.  Marland Kitchen HLD (hyperlipidemia)   . Multiple system atrophy     [name]  . VAGINITIS, ATROPHIC 11/07/2007    Past Surgical History  Procedure Laterality Date  . Laparoscopic nissen fundoplication    . Cystectomy    . Tumor removal    . Tubal ligation    . Colonoscopy N/A 08/29/2013    int hemm; Inda Castle, MD    There were no vitals taken for this visit.  Visit Diagnosis:  Right leg weakness  Abnormality of gait  Incoordination of extremity      Subjective Assessment - 11/08/14 1552    Symptoms Denies changes since last visit.  Denies falls.   Currently in Pain? No/denies                    [facility] Adult PT Treatment/Exercise - 11/08/14 1623    Transfers   Transfers Sit to Stand;Stand to Sit   Sit to Stand 4: Min guard   Sit to Stand Details (indicate cue type and  reason) assist to keep chair from moving/pushing backwards   Stand to Sit 4: Min guard   Stand to Sit Details cues to reach back and control descent   Ambulation/Gait   Ambulation/Gait Yes   Ambulation/Gait Assistance 4: Min guard;4: Min assist   Ambulation/Gait Assistance Details tends to advance RW too far forward.  Needs assist to steer at times.   Ambulation Distance (Feet) 330 Feet  160   Assistive device Rolling walker   Gait Pattern Step-to pattern;Decreased step length - right;Decreased step length - left   High Level Balance   High Level Balance Activities Side stepping;Backward walking  in parallel bars with Bil UE and min guard assist   High Level Balance Comments difficulty with R LE placement at times   Knee/Hip Exercises: Stretches   Passive Hamstring Stretch 3 reps;30 seconds;Other (comment)  seated edge of chair   Gastroc Stretch 3 reps;30 seconds  seated edge of chair   Soleus Stretch 3 reps;30 seconds;Other (comment)  seated edge of chair   Knee/Hip Exercises: Standing   Other Standing Knee Exercises alternating taps to 4" step in parallel bars with bil UE assist working on LE  control                  PT Short Term Goals - 10/16/14 2115    PT SHORT TERM GOAL #1   Title Pt. will increase standing tolerance so she is able to report standing 5" at home with S for independence with ADL's.   Baseline 11-15-14   Time 4   Period Weeks   Status New   PT SHORT TERM GOAL #2   Title Increase distance in 3" walk test by at least 50' for increased amb. speed and endurance.   Baseline 11-15-14   Time 4   Period Weeks   Status New   PT SHORT TERM GOAL #3   Title Transfer sit to stand from wheelchair with CGA   Baseline 11-15-14  (min A needed 10-16-14)   Time 4   Period Weeks   Status New   PT SHORT TERM GOAL #4   Title Independent in HEP    Baseline 11-15-14   Time 4   Period Weeks   Status New           PT Long Term Goals - 10/16/14 2121    PT LONG  TERM GOAL #1   Title Pt. will ambulate 360' nonstop with RW to demo incr. endurance with CGA   Baseline 240' with min A    11-15-14   Time 8   Period Weeks   Status New   PT LONG TERM GOAL #2   Title Report ability to stand at least 10" at home for improved standing tolerance   Baseline 3-4" with c/o pain in legs   11-15-14   Time 8   Period Weeks   Status New   PT LONG TERM GOAL #3   Title Report at least 25% less pain in RLE with weight-bearing   Baseline 11-15-14   Time 8   Period Weeks   Status New   PT LONG TERM GOAL #4   Title Perform at least 10" on Nustep to demo improved activity tolerance/endurance   Baseline 11-15-14   Time 8   Period Weeks   Status New               Plan - 11/08/14 1628    Clinical Impression Statement Pt with improved control with RLE today.  No freezing episodes during ambulation.  Continue PT per POC.   Pt will benefit from skilled therapeutic intervention in order to improve on the following deficits Abnormal gait;Decreased coordination;Impaired flexibility;Decreased endurance;Postural dysfunction;Decreased activity tolerance;Decreased balance;Pain;Decreased mobility;Decreased strength   Rehab Potential Fair   PT Frequency 2x / week   PT Duration 8 weeks   PT Treatment/Interventions ADLs/Self Care Home Management;Therapeutic activities;Patient/family education;Therapeutic exercise;Gait training;Balance training;Neuromuscular re-education;Stair training;Functional mobility training   PT Next Visit Plan Begin checking goals.   Consulted and Agree with Plan of Care Patient        Problem List Patient Active Problem List   Diagnosis Date Noted  . B-complex deficiency 06/07/2014  . Recurrent falls 02/06/2014  . Multiple system atrophy   . Dysphagia, unspecified(787.20) 07/28/2013  . Lower extremity edema 09/26/2012  . Shoulder pain, left 07/20/2012  . 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 Routine health maintenance 08/26/2011  . Murmur  05/27/2011  . HYPERCHOLESTEROLEMIA  IIA 05/14/2010  . VENOUS INSUFFICIENCY, LEGS 06/21/2009  . CORONARY HEART DISEASE 06/09/2008  . HYPOTHYROIDISM 05/15/2008  . ASTHMA 05/15/2008  . DEPRESSION 11/07/2007  . 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 GERD 11/07/2007  .  VAGINITIS, ATROPHIC 11/07/2007    [name] 11/08/2014, 4:31 PM  [facility] [address] [place], [place], [address] Phone: [phone]   Fax:  [place], [place] 11/08/2014 4:31 PM Phone: [phone] Fax: [phone]

## 2014-11-09 DIAGNOSIS — M810 Age-related osteoporosis without current pathological fracture: Secondary | ICD-10-CM

## 2014-11-09 HISTORY — DX: Age-related osteoporosis without current pathological fracture: M81.0

## 2014-11-13 ENCOUNTER — Ambulatory Visit: Payer: Medicare Other | Admitting: Physical Therapy

## 2014-11-15 ENCOUNTER — Ambulatory Visit: Payer: Medicare Other | Admitting: Gastroenterology

## 2014-11-15 ENCOUNTER — Ambulatory Visit: Payer: Medicare Other | Attending: Neurology | Admitting: Physical Therapy

## 2014-11-15 DIAGNOSIS — E785 Hyperlipidemia, unspecified: Secondary | ICD-10-CM | POA: Insufficient documentation

## 2014-11-15 DIAGNOSIS — R29898 Other symptoms and signs involving the musculoskeletal system: Secondary | ICD-10-CM

## 2014-11-15 DIAGNOSIS — R269 Unspecified abnormalities of gait and mobility: Secondary | ICD-10-CM | POA: Insufficient documentation

## 2014-11-15 DIAGNOSIS — I2510 Atherosclerotic heart disease of native coronary artery without angina pectoris: Secondary | ICD-10-CM | POA: Insufficient documentation

## 2014-11-15 DIAGNOSIS — R278 Other lack of coordination: Secondary | ICD-10-CM | POA: Insufficient documentation

## 2014-11-15 DIAGNOSIS — K219 Gastro-esophageal reflux disease without esophagitis: Secondary | ICD-10-CM | POA: Diagnosis not present

## 2014-11-16 ENCOUNTER — Encounter: Payer: Self-pay | Admitting: Physical Therapy

## 2014-11-16 NOTE — Therapy (Signed)
[facility] [address] [place], [place], [address] Phone: [phone]   Fax:  [phone]  Physical Therapy Treatment  Patient Details  Name: [name] [name] MRN: [id number] Date of Birth: Jul 12, 1941 Referring Provider:  Ria Bush, MD  Encounter Date: 11/15/2014      PT End of Session - 11/16/14 1011    Visit Number 7   Number of Visits 17   Date for PT Re-Evaluation 11/15/14   PT Start Time 1450   PT Stop Time 1532   PT Time Calculation (min) 42 min   Equipment Utilized During Treatment Gait belt      Past Medical History  Diagnosis Date  . GERD (gastroesophageal reflux disease)     s/p nissen  . Familial tremor     followed by Dr. Erling Cruz  . Unspecified chronic bronchitis   . Irritable bladder   . History of pneumonia   . Hypothyroidism   . Depression   . Asthma   . CAD (coronary artery disease)     myoview 5/08:  EF 76%, no scar, no ischemia, +ECG changes with exercise;  cath 04/08/07:  pLAD 30%, mLAD 60-70% - med tx.  Marland Kitchen HLD (hyperlipidemia)   . Multiple system atrophy     [name]  . VAGINITIS, ATROPHIC 11/07/2007    Past Surgical History  Procedure Laterality Date  . Laparoscopic nissen fundoplication    . Cystectomy    . Tumor removal    . Tubal ligation    . Colonoscopy N/A 08/29/2013    int hemm; Inda Castle, MD    There were no vitals taken for this visit.  Visit Diagnosis:  Right leg weakness  Abnormality of gait      Subjective Assessment - 11/16/14 1007    Symptoms Pt. reports feeling better today - had diarrhea on Tuesday and was unable to come to PT; doesn't know if this is due to acid reflux or part of MSA disease process   Currently in Pain? No/denies                    [facility] Adult PT Treatment/Exercise - 11/16/14 0001    Transfers   Transfers Sit to Stand   Sit to Stand 4: Min guard   Stand to Sit 4: Min guard   Stand to Sit Details  cues to attempt to control descent   Ambulation/Gait   Ambulation/Gait Yes   Ambulation/Gait Assistance 4: Min guard   Ambulation Distance (Feet) 360 Feet   Assistive device Rolling walker   Gait Pattern Step-to pattern;Decreased step length - right;Decreased step length - left   High Level Balance   High Level Balance Activities Side stepping  crossovers with RLE/LLE 10 reps inside parallel bars   Knee/Hip Exercises: Aerobic   Stationary Bike Scifit level 1.5 x 15" with bil. UE's and LE's (no charge as unsupervised)    Practiced standing unsupported inside parallel bars with CGA  Neuro Re-ed: stepping over and back of 1/2 bolster inside bars with bil. UE support with CGA for feed forward activity for LLE            PT Short Term Goals - 10/16/14 2115    PT SHORT TERM GOAL #1   Title Pt. will increase standing tolerance so she is able to report standing 5" at home with S for independence with ADL's.   Baseline 11-15-14   Time 4   Period Weeks  Status New   PT SHORT TERM GOAL #2   Title Increase distance in 3" walk test by at least 50' for increased amb. speed and endurance.   Baseline 11-15-14   Time 4   Period Weeks   Status New   PT SHORT TERM GOAL #3   Title Transfer sit to stand from wheelchair with CGA   Baseline 11-15-14  (min A needed 10-16-14)   Time 4   Period Weeks   Status New   PT SHORT TERM GOAL #4   Title Independent in HEP    Baseline 11-15-14   Time 4   Period Weeks   Status New           PT Long Term Goals - 10/16/14 2121    PT LONG TERM GOAL #1   Title Pt. will ambulate 360' nonstop with RW to demo incr. endurance with CGA   Baseline 240' with min A    11-15-14   Time 8   Period Weeks   Status New   PT LONG TERM GOAL #2   Title Report ability to stand at least 10" at home for improved standing tolerance   Baseline 3-4" with c/o pain in legs   11-15-14   Time 8   Period Weeks   Status New   PT LONG TERM GOAL #3   Title Report at least 25%  less pain in RLE with weight-bearing   Baseline 11-15-14   Time 8   Period Weeks   Status New   PT LONG TERM GOAL #4   Title Perform at least 10" on Nustep to demo improved activity tolerance/endurance   Baseline 11-15-14   Time 8   Period Weeks   Status New               Plan - 11/16/14 1012    Clinical Impression Statement Pt. had difficulty with coordination in RLE - unable to get right foot to touch floor in crossover exercise - foot stayed up in air during theend of the crossover movement   Pt will benefit from skilled therapeutic intervention in order to improve on the following deficits Abnormal gait;Decreased coordination;Impaired flexibility;Decreased endurance;Postural dysfunction;Decreased activity tolerance;Decreased balance;Pain;Decreased mobility;Decreased strength   PT Frequency 2x / week   PT Duration 8 weeks   PT Treatment/Interventions ADLs/Self Care Home Management;Therapeutic activities;Patient/family education;Therapeutic exercise;Gait training;Balance training;Neuromuscular re-education;Stair training;Functional mobility training   PT Next Visit Plan check STG's   PT Home Exercise Plan supine exercises for strengthening   Consulted and Agree with Plan of Care Patient        Problem List Patient Active Problem List   Diagnosis Date Noted  . B-complex deficiency 06/07/2014  . Recurrent falls 02/06/2014  . Multiple system atrophy   . Dysphagia, unspecified(787.20) 07/28/2013  . Lower extremity edema 09/26/2012  . Shoulder pain, left 07/20/2012  . 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 Routine health maintenance 08/26/2011  . Murmur 05/27/2011  . HYPERCHOLESTEROLEMIA  IIA 05/14/2010  . VENOUS INSUFFICIENCY, LEGS 06/21/2009  . CORONARY HEART DISEASE 06/09/2008  . HYPOTHYROIDISM 05/15/2008  . ASTHMA 05/15/2008  . DEPRESSION 11/07/2007  . 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 GERD 11/07/2007  . VAGINITIS, ATROPHIC 11/07/2007    Alda Lea,  PT 11/16/2014, 10:17 AM  [facility] [address] [place], [place], [address] Phone: [phone]   Fax:  [phone]

## 2014-11-19 ENCOUNTER — Other Ambulatory Visit: Payer: Self-pay | Admitting: Family Medicine

## 2014-11-19 ENCOUNTER — Ambulatory Visit: Payer: Medicare Other | Admitting: Physical Therapy

## 2014-11-19 DIAGNOSIS — I2510 Atherosclerotic heart disease of native coronary artery without angina pectoris: Secondary | ICD-10-CM | POA: Diagnosis not present

## 2014-11-19 DIAGNOSIS — E039 Hypothyroidism, unspecified: Secondary | ICD-10-CM

## 2014-11-19 DIAGNOSIS — R278 Other lack of coordination: Secondary | ICD-10-CM | POA: Diagnosis not present

## 2014-11-19 DIAGNOSIS — R29898 Other symptoms and signs involving the musculoskeletal system: Secondary | ICD-10-CM | POA: Diagnosis not present

## 2014-11-19 DIAGNOSIS — R269 Unspecified abnormalities of gait and mobility: Secondary | ICD-10-CM

## 2014-11-19 DIAGNOSIS — K219 Gastro-esophageal reflux disease without esophagitis: Secondary | ICD-10-CM | POA: Diagnosis not present

## 2014-11-19 DIAGNOSIS — E7800 Pure hypercholesterolemia, unspecified: Secondary | ICD-10-CM

## 2014-11-19 DIAGNOSIS — E785 Hyperlipidemia, unspecified: Secondary | ICD-10-CM | POA: Diagnosis not present

## 2014-11-20 ENCOUNTER — Encounter: Payer: Self-pay | Admitting: Physical Therapy

## 2014-11-20 ENCOUNTER — Ambulatory Visit: Payer: Medicare Other | Admitting: Physical Therapy

## 2014-11-20 ENCOUNTER — Other Ambulatory Visit (INDEPENDENT_AMBULATORY_CARE_PROVIDER_SITE_OTHER): Payer: Medicare Other

## 2014-11-20 DIAGNOSIS — R278 Other lack of coordination: Secondary | ICD-10-CM | POA: Diagnosis not present

## 2014-11-20 DIAGNOSIS — R269 Unspecified abnormalities of gait and mobility: Secondary | ICD-10-CM | POA: Diagnosis not present

## 2014-11-20 DIAGNOSIS — E7800 Pure hypercholesterolemia, unspecified: Secondary | ICD-10-CM

## 2014-11-20 DIAGNOSIS — E785 Hyperlipidemia, unspecified: Secondary | ICD-10-CM | POA: Diagnosis not present

## 2014-11-20 DIAGNOSIS — E039 Hypothyroidism, unspecified: Secondary | ICD-10-CM

## 2014-11-20 DIAGNOSIS — R29898 Other symptoms and signs involving the musculoskeletal system: Secondary | ICD-10-CM

## 2014-11-20 DIAGNOSIS — I2510 Atherosclerotic heart disease of native coronary artery without angina pectoris: Secondary | ICD-10-CM | POA: Diagnosis not present

## 2014-11-20 DIAGNOSIS — K219 Gastro-esophageal reflux disease without esophagitis: Secondary | ICD-10-CM | POA: Diagnosis not present

## 2014-11-20 LAB — COMPREHENSIVE METABOLIC PANEL
ALBUMIN: 3.9 g/dL (ref 3.5–5.2)
ALK PHOS: 113 U/L (ref 39–117)
ALT: 18 U/L (ref 0–35)
AST: 20 U/L (ref 0–37)
BUN: 18 mg/dL (ref 6–23)
CO2: 29 mEq/L (ref 19–32)
Calcium: 8.9 mg/dL (ref 8.4–10.5)
Chloride: 105 mEq/L (ref 96–112)
Creatinine, Ser: 0.8 mg/dL (ref 0.4–1.2)
GFR: 76.62 mL/min (ref >60.00)
Glucose, Bld: 112 mg/dL — ABNORMAL HIGH (ref 70–99)
POTASSIUM: 3.9 meq/L (ref 3.5–5.1)
SODIUM: 136 meq/L (ref 135–145)
TOTAL PROTEIN: 6.6 g/dL (ref 6.0–8.3)
Total Bilirubin: 0.8 mg/dL (ref 0.2–1.2)

## 2014-11-20 LAB — LIPID PANEL
Cholesterol: 130 mg/dL (ref 0–200)
HDL: 43.9 mg/dL (ref >39.00)
LDL CALC: 63 mg/dL (ref 0–99)
NonHDL: 86.1
TRIGLYCERIDES: 115 mg/dL (ref 0.0–149.0)
Total CHOL/HDL Ratio: 3
VLDL: 23 mg/dL (ref 0.0–40.0)

## 2014-11-20 LAB — TSH: TSH: 8.76 u[IU]/mL — ABNORMAL HIGH (ref 0.35–4.50)

## 2014-11-20 NOTE — Therapy (Signed)
[facility] [address] [place], [place], [address] Phone: [phone]   Fax:  [phone]  Physical Therapy Treatment  Patient Details  Name: [name] [name] MRN: [id number] Date of Birth: 1941-06-04 Referring Provider:  Ria Bush, MD  Encounter Date: 11/19/2014      PT End of Session - 11/20/14 0939    Visit Number 8   Number of Visits 17   Date for PT Re-Evaluation 11/19/14   PT Start Time 6811   PT Stop Time 1405   PT Time Calculation (min) 53 min   Equipment Utilized During Treatment Gait belt      Past Medical History  Diagnosis Date  . GERD (gastroesophageal reflux disease)     s/p nissen  . Familial tremor     followed by Dr. Erling Cruz  . Unspecified chronic bronchitis   . Irritable bladder   . History of pneumonia   . Hypothyroidism   . Depression   . Asthma   . CAD (coronary artery disease)     myoview 5/08:  EF 76%, no scar, no ischemia, +ECG changes with exercise;  cath 04/08/07:  pLAD 30%, mLAD 60-70% - med tx.  Marland Kitchen HLD (hyperlipidemia)   . Multiple system atrophy     [name]  . VAGINITIS, ATROPHIC 11/07/2007    Past Surgical History  Procedure Laterality Date  . Laparoscopic nissen fundoplication    . Cystectomy    . Tumor removal    . Tubal ligation    . Colonoscopy N/A 08/29/2013    int hemm; Inda Castle, MD    There were no vitals taken for this visit.  Visit Diagnosis:  Right leg weakness  Abnormality of gait      Subjective Assessment - 11/20/14 0936    Symptoms Pt. reports no falls - no new problems since last visit   Currently in Pain? No/denies                    [facility] Adult PT Treatment/Exercise - 11/20/14 0937    Transfers   Transfers Sit to Stand   Sit to Stand 4: Min guard   Ambulation/Gait   Ambulation/Gait Yes   Ambulation/Gait Assistance 4: Min guard   Ambulation Distance (Feet) 480 Feet   Assistive device Rolling walker    Gait Pattern Step-to pattern;Decreased step length - right;Decreased step length - left   High Level Balance   High Level Balance Activities Side stepping  crossovers with RLE/LLE 10 reps inside parallel bars   Knee/Hip Exercises: Aerobic   Stationary Bike Scifit level 1.5 x 15" with bil. UE's and LE's (no charge as unsupervised)        Neuro Re-ed; Big exercises seated including reaching down and up x 10 reps; seated trunk rotation x 10 reps each Standing coordination exercise with right an left foot - tapping in/out of taped boxes on floor with UE assist sidestepping inside bars 10' x 2 reps with UE support         PT Short Term Goals - 11/20/14 0943    PT SHORT TERM GOAL #1   Title Pt. will increase standing tolerance so she is able to report standing 5" at home with S for independence with ADL's.   Baseline met 11-19-14   Status Achieved   PT SHORT TERM GOAL #2   Title Increase distance in 3" walk test by at least 50' for increased amb. speed and endurance.  Baseline to be assessed  next PT visit   Status On-going   PT SHORT TERM GOAL #3   Title Transfer sit to stand from wheelchair with CGA   Baseline met 11-19-14   Status Achieved   PT SHORT TERM GOAL #4   Title Independent in HEP    Status Achieved           PT Long Term Goals - 10/16/14 2121    PT LONG TERM GOAL #1   Title Pt. will ambulate 360' nonstop with RW to demo incr. endurance with CGA   Baseline 240' with min A    11-15-14   Time 8   Period Weeks   Status New   PT LONG TERM GOAL #2   Title Report ability to stand at least 10" at home for improved standing tolerance   Baseline 3-4" with c/o pain in legs   11-15-14   Time 8   Period Weeks   Status New   PT LONG TERM GOAL #3   Title Report at least 25% less pain in RLE with weight-bearing   Baseline 11-15-14   Time 8   Period Weeks   Status New   PT LONG TERM GOAL #4   Title Perform at least 10" on Nustep to demo improved activity  tolerance/endurance   Baseline 11-15-14   Time 8   Period Weeks   Status New               Plan - 11/20/14 0940    Clinical Impression Statement Pt. met STG's #1,3,4; #2 to be assessed next visit   Pt will benefit from skilled therapeutic intervention in order to improve on the following deficits Abnormal gait;Decreased coordination;Impaired flexibility;Decreased endurance;Postural dysfunction;Decreased activity tolerance;Decreased balance;Pain;Decreased mobility;Decreased strength   Rehab Potential Fair   PT Frequency 2x / week   PT Duration 8 weeks   PT Treatment/Interventions ADLs/Self Care Home Management;Therapeutic activities;Patient/family education;Therapeutic exercise;Gait training;Balance training;Neuromuscular re-education;Stair training;Functional mobility training   PT Next Visit Plan check 3" walk test; cont. gait and balance   Consulted and Agree with Plan of Care Patient        Problem List Patient Active Problem List   Diagnosis Date Noted  . B-complex deficiency 06/07/2014  . Recurrent falls 02/06/2014  . Multiple system atrophy   . Dysphagia, unspecified(787.20) 07/28/2013  . Lower extremity edema 09/26/2012  . Shoulder pain, left 07/20/2012  . 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 Routine health maintenance 08/26/2011  . Murmur 05/27/2011  . HYPERCHOLESTEROLEMIA  IIA 05/14/2010  . VENOUS INSUFFICIENCY, LEGS 06/21/2009  . CORONARY HEART DISEASE 06/09/2008  . Hypothyroidism 05/15/2008  . ASTHMA 05/15/2008  . DEPRESSION 11/07/2007  . 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 GERD 11/07/2007  . VAGINITIS, ATROPHIC 11/07/2007    Alda Lea, PT 11/20/2014, 9:49 AM  [facility] [address] [place], [place], [address] Phone: [phone]   Fax:  [phone]

## 2014-11-20 NOTE — Therapy (Signed)
[facility] [address] [place], [place], [address] Phone: [phone]   Fax:  [phone]  Physical Therapy Treatment  Patient Details  Name: [name] [name] MRN: [id number] Date of Birth: 1941-06-19 Referring Provider:  Ria Bush, MD  Encounter Date: 11/20/2014      PT End of Session - 11/20/14 2101    Visit Number 9   Number of Visits 17   Date for PT Re-Evaluation 11/19/14   PT Start Time 1316   PT Stop Time 1405   PT Time Calculation (min) 49 min   Equipment Utilized During Treatment Gait belt   Activity Tolerance Patient tolerated treatment well   Behavior During Therapy [facility] for tasks assessed/performed      Past Medical History  Diagnosis Date  . GERD (gastroesophageal reflux disease)     s/p nissen  . Familial tremor     followed by Dr. Erling Cruz  . Unspecified chronic bronchitis   . Irritable bladder   . History of pneumonia   . Hypothyroidism   . Depression   . Asthma   . CAD (coronary artery disease)     myoview 5/08:  EF 76%, no scar, no ischemia, +ECG changes with exercise;  cath 04/08/07:  pLAD 30%, mLAD 60-70% - med tx.  Marland Kitchen HLD (hyperlipidemia)   . Multiple system atrophy     [name]  . VAGINITIS, ATROPHIC 11/07/2007    Past Surgical History  Procedure Laterality Date  . Laparoscopic nissen fundoplication    . Cystectomy    . Tumor removal    . Tubal ligation    . Colonoscopy N/A 08/29/2013    int hemm; Inda Castle, MD    There were no vitals taken for this visit.  Visit Diagnosis:  Abnormality of gait  Right leg weakness      Subjective Assessment - 11/20/14 2058    Symptoms Pt. states "I feel so much better when I come to therapy and can walk longer distances"   Currently in Pain? No/denies                    [facility] Adult PT Treatment/Exercise - 11/20/14 1739    Ambulation/Gait   Ambulation/Gait Yes   Ambulation/Gait Assistance 4: Min  guard   Ambulation Distance (Feet) 480 Feet   Assistive device Rolling walker   Gait Pattern Step-to pattern   Ambulation Surface Level   Ambulation/Gait Yes   Ambulation/Gait Assistance 4: Min guard   Gait Pattern Step-to pattern;Decreased step length - right;Decreased step length - left   Knee/Hip Exercises: Aerobic   Stationary Bike Scifit level 1.5 x 15" with bil. UE's and LE's (no charge as unsupervised)     Neuro Re-ed:  Pt. Performed seated BIG exercises including reaching down and up x10 reps, trunk rotation to R and L 10 reps each;  Standing balacne/coordination activity of touching balance bubbles with R/L foot with mod assist  TherAct; practiced scooting up/back on mat with feet on 4" step for contact; pt. Needed mod assist for scooting forward- Pt. Unable to laterally lean and move pelvis forward practice sit to stand without UE support x 5 reps with CGA to min A            PT Short Term Goals - 11/20/14 0943    PT SHORT TERM GOAL #1   Title Pt. will increase standing tolerance so she is able to report standing 5" at home with S  for independence with ADL's.   Baseline met 11-19-14   Status Achieved   PT SHORT TERM GOAL #2   Title Increase distance in 3" walk test by at least 50' for increased amb. speed and endurance.   Baseline to be assessed  next PT visit   Status On-going   PT SHORT TERM GOAL #3   Title Transfer sit to stand from wheelchair with CGA   Baseline met 11-19-14   Status Achieved   PT SHORT TERM GOAL #4   Title Independent in HEP    Status Achieved           PT Long Term Goals - 10/16/14 2121    PT LONG TERM GOAL #1   Title Pt. will ambulate 360' nonstop with RW to demo incr. endurance with CGA   Baseline 240' with min A    11-15-14   Time 8   Period Weeks   Status New   PT LONG TERM GOAL #2   Title Report ability to stand at least 10" at home for improved standing tolerance   Baseline 3-4" with c/o pain in legs   11-15-14   Time 8    Period Weeks   Status New   PT LONG TERM GOAL #3   Title Report at least 25% less pain in RLE with weight-bearing   Baseline 11-15-14   Time 8   Period Weeks   Status New   PT LONG TERM GOAL #4   Title Perform at least 10" on Nustep to demo improved activity tolerance/endurance   Baseline 11-15-14   Time 8   Period Weeks   Status New               Plan - 11/20/14 2102    Clinical Impression Statement STG's #1,3,,4 met; #2 to be assessed next visit   Pt will benefit from skilled therapeutic intervention in order to improve on the following deficits Abnormal gait;Decreased coordination;Impaired flexibility;Decreased endurance;Postural dysfunction;Decreased activity tolerance;Decreased balance;Pain;Decreased mobility;Decreased strength   Rehab Potential Fair   PT Frequency 2x / week   PT Duration 8 weeks   PT Treatment/Interventions ADLs/Self Care Home Management;Therapeutic activities;Patient/family education;Therapeutic exercise;Gait training;Balance training;Neuromuscular re-education;Stair training;Functional mobility training   PT Next Visit Plan check 3" walk test; cont. gait and balance   Consulted and Agree with Plan of Care Patient        Problem List Patient Active Problem List   Diagnosis Date Noted  . B-complex deficiency 06/07/2014  . Recurrent falls 02/06/2014  . Multiple system atrophy   . Dysphagia, unspecified(787.20) 07/28/2013  . Lower extremity edema 09/26/2012  . Shoulder pain, left 07/20/2012  . 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 Routine health maintenance 08/26/2011  . Murmur 05/27/2011  . HYPERCHOLESTEROLEMIA  IIA 05/14/2010  . VENOUS INSUFFICIENCY, LEGS 06/21/2009  . CORONARY HEART DISEASE 06/09/2008  . Hypothyroidism 05/15/2008  . ASTHMA 05/15/2008  . DEPRESSION 11/07/2007  . 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 GERD 11/07/2007  . VAGINITIS, ATROPHIC 11/07/2007    Alda Lea, PT 11/20/2014, 9:07 PM  [facility] [address] [place], [place], [address] Phone: [phone]   Fax:  [phone]

## 2014-11-27 ENCOUNTER — Ambulatory Visit: Payer: Medicare Other | Admitting: Physical Therapy

## 2014-11-27 ENCOUNTER — Encounter: Payer: Self-pay | Admitting: Family Medicine

## 2014-11-27 ENCOUNTER — Ambulatory Visit (INDEPENDENT_AMBULATORY_CARE_PROVIDER_SITE_OTHER): Payer: Medicare Other | Admitting: Family Medicine

## 2014-11-27 ENCOUNTER — Telehealth: Payer: Self-pay | Admitting: Family Medicine

## 2014-11-27 ENCOUNTER — Encounter: Payer: Self-pay | Admitting: Physical Therapy

## 2014-11-27 VITALS — BP 116/60 | HR 72 | Temp 97.5°F | Wt 151.2 lb

## 2014-11-27 DIAGNOSIS — G903 Multi-system degeneration of the autonomic nervous system: Secondary | ICD-10-CM

## 2014-11-27 DIAGNOSIS — R269 Unspecified abnormalities of gait and mobility: Secondary | ICD-10-CM

## 2014-11-27 DIAGNOSIS — F329 Major depressive disorder, single episode, unspecified: Secondary | ICD-10-CM

## 2014-11-27 DIAGNOSIS — Z7189 Other specified counseling: Secondary | ICD-10-CM | POA: Insufficient documentation

## 2014-11-27 DIAGNOSIS — R278 Other lack of coordination: Secondary | ICD-10-CM | POA: Diagnosis not present

## 2014-11-27 DIAGNOSIS — E039 Hypothyroidism, unspecified: Secondary | ICD-10-CM

## 2014-11-27 DIAGNOSIS — F32A Depression, unspecified: Secondary | ICD-10-CM

## 2014-11-27 DIAGNOSIS — E785 Hyperlipidemia, unspecified: Secondary | ICD-10-CM | POA: Diagnosis not present

## 2014-11-27 DIAGNOSIS — K219 Gastro-esophageal reflux disease without esophagitis: Secondary | ICD-10-CM | POA: Diagnosis not present

## 2014-11-27 DIAGNOSIS — R296 Repeated falls: Secondary | ICD-10-CM

## 2014-11-27 DIAGNOSIS — E7800 Pure hypercholesterolemia, unspecified: Secondary | ICD-10-CM

## 2014-11-27 DIAGNOSIS — R29898 Other symptoms and signs involving the musculoskeletal system: Secondary | ICD-10-CM | POA: Diagnosis not present

## 2014-11-27 DIAGNOSIS — Z0000 Encounter for general adult medical examination without abnormal findings: Secondary | ICD-10-CM | POA: Diagnosis not present

## 2014-11-27 DIAGNOSIS — G239 Degenerative disease of basal ganglia, unspecified: Secondary | ICD-10-CM

## 2014-11-27 DIAGNOSIS — M810 Age-related osteoporosis without current pathological fracture: Secondary | ICD-10-CM

## 2014-11-27 DIAGNOSIS — I2510 Atherosclerotic heart disease of native coronary artery without angina pectoris: Secondary | ICD-10-CM | POA: Diagnosis not present

## 2014-11-27 MED ORDER — SYNTHROID 112 MCG PO TABS: 112.0000 ug | ORAL_TABLET | Freq: Every day | ORAL | Status: DC

## 2014-11-27 NOTE — Therapy (Signed)
[facility] [address] [place], [place], [address] Phone: [phone]   Fax:  [phone]  Physical Therapy Treatment  Patient Details  Name: [name] [name] MRN: [id number] Date of Birth: [birth year] Referring Provider:  Ria Bush, MD  Encounter Date: 11/27/2014      PT End of Session - 11/27/14 1452    Visit Number 10   Number of Visits 17   Date for PT Re-Evaluation 12/06/14   PT Start Time 1315   PT Stop Time 1402   PT Time Calculation (min) 47 min   Equipment Utilized During Treatment Gait belt   Activity Tolerance Patient tolerated treatment well   Behavior During Therapy [facility] for tasks assessed/performed      Past Medical History  Diagnosis Date  . GERD (gastroesophageal reflux disease)     s/p nissen  . Familial tremor     followed by Dr. Erling Cruz  . Unspecified chronic bronchitis   . Irritable bladder   . History of pneumonia   . Hypothyroidism   . Depression   . Asthma   . CAD (coronary artery disease)     myoview 5/08:  EF 76%, no scar, no ischemia, +ECG changes with exercise;  cath 04/08/07:  pLAD 30%, mLAD 60-70% - med tx.  Marland Kitchen HLD (hyperlipidemia)   . Multiple system atrophy     [name]  . VAGINITIS, ATROPHIC 11/07/2007    Past Surgical History  Procedure Laterality Date  . Laparoscopic nissen fundoplication    . Cystectomy    . Tumor removal    . Tubal ligation    . Colonoscopy N/A 08/29/2013    int hemm; Inda Castle, MD    There were no vitals taken for this visit.  Visit Diagnosis:  Abnormality of gait  Right leg weakness  Incoordination of extremity      Subjective Assessment - 11/27/14 1404    Symptoms No c/o.  "It feels so good to be able to move around this much here."   Currently in Pain? No/denies   Multiple Pain Sites No                    [facility] Adult PT Treatment/Exercise - 11/27/14 1405    Transfers   Transfers Sit to  Stand;Stand to Sit   Sit to Stand 4: Min guard;4: Min assist   Sit to Stand Details (indicate cue type and reason) cues for forward lean with nose over toes and foot placement.  pushes posteriorly.   Stand to Sit 4: Min guard   Stand to Sit Details cues for hand placement   Ambulation/Gait   Ambulation/Gait Yes   Ambulation/Gait Assistance 4: Min guard   Ambulation Distance (Feet) 490 Feet   Assistive device Rolling walker   Gait Pattern Step-to pattern   Ambulation Surface Level   Gait velocity 3 minute walk test with RW x 145 feet   Wheelchair Mobility   Wheelchair Mobility Yes   Wheelchair Assistance 5: Supervision   Wheelchair Propulsion Both upper extremities   Distance 100   High Level Balance   High Level Balance Activities Marching forwards;Other (comment)  Marching in parallel bars and staggered stance weight shift   High Level Balance Comments difficulty with RLE placement and clonus present when weight shifts onto RLE   Knee/Hip Exercises: Stretches   Passive Hamstring Stretch 3 reps;30 seconds;Other (comment)   Gastroc Stretch 3 reps;30 seconds  PT Short Term Goals - 11/27/14 1458    PT SHORT TERM GOAL #1   Title Pt. will increase standing tolerance so she is able to report standing 5" at home with S for independence with ADL's.   Baseline met 11-19-14   Time 4   Period Weeks   Status Achieved   PT SHORT TERM GOAL #2   Title Increase distance in 3" walk test by at least 50' for increased amb. speed and endurance.   Time 4   Period Weeks   Status Not Met   PT SHORT TERM GOAL #3   Title Transfer sit to stand from wheelchair with CGA   Baseline met 11-19-14   Time 4   Period Weeks   Status Achieved   PT SHORT TERM GOAL #4   Title Independent in HEP    Baseline 11-15-14   Time 4   Period Weeks   Status Achieved           PT Long Term Goals - 10/16/14 2121    PT LONG TERM GOAL #1   Title Pt. will ambulate 360' nonstop with RW to  demo incr. endurance with CGA   Baseline 240' with min A    11-15-14   Time 8   Period Weeks   Status New   PT LONG TERM GOAL #2   Title Report ability to stand at least 10" at home for improved standing tolerance   Baseline 3-4" with c/o pain in legs   11-15-14   Time 8   Period Weeks   Status New   PT LONG TERM GOAL #3   Title Report at least 25% less pain in RLE with weight-bearing   Baseline 11-15-14   Time 8   Period Weeks   Status New   PT LONG TERM GOAL #4   Title Perform at least 10" on Nustep to demo improved activity tolerance/endurance   Baseline 11-15-14   Time 8   Period Weeks   Status New               Plan - 11/27/14 1453    Clinical Impression Statement Goal #2 unmet only by 10'.  Pt continues to be motivated and works hard during sessions.   Pt will benefit from skilled therapeutic intervention in order to improve on the following deficits Abnormal gait;Decreased coordination;Impaired flexibility;Decreased endurance;Postural dysfunction;Decreased activity tolerance;Decreased balance;Pain;Decreased mobility;Decreased strength   Rehab Potential Fair   PT Duration 8 weeks   PT Treatment/Interventions ADLs/Self Care Home Management;Therapeutic activities;Patient/family education;Therapeutic exercise;Gait training;Balance training;Neuromuscular re-education;Stair training;Functional mobility training   PT Next Visit Plan Continue gait, stretching and balance   Consulted and Agree with Plan of Care Patient        Problem List Patient Active Problem List   Diagnosis Date Noted  . B-complex deficiency 06/07/2014  . Recurrent falls 02/06/2014  . Multiple system atrophy   . Dysphagia, unspecified(787.20) 07/28/2013  . Lower extremity edema 09/26/2012  . Shoulder pain, left 07/20/2012  . 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 Routine health maintenance 08/26/2011  . Murmur 05/27/2011  . HYPERCHOLESTEROLEMIA  IIA 05/14/2010  . VENOUS INSUFFICIENCY, LEGS 06/21/2009   . CORONARY HEART DISEASE 06/09/2008  . Hypothyroidism 05/15/2008  . ASTHMA 05/15/2008  . DEPRESSION 11/07/2007  . 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 GERD 11/07/2007  . VAGINITIS, ATROPHIC 11/07/2007    [name] 11/27/2014, 2:58 PM  [facility] [address] [place], [place], [address] Phone: [phone]  Fax:  [place], [place] 11/27/14 Phone: [phone] Fax: [phone]

## 2014-11-27 NOTE — Progress Notes (Signed)
Pre visit review using our clinic review tool, if applicable. No additional management support is needed unless otherwise documented below in the visit note. 

## 2014-11-27 NOTE — Addendum Note (Signed)
Addended by: Ria Bush on: 11/27/2014 05:15 PM   Modules accepted: Orders, SmartSet

## 2014-11-27 NOTE — Assessment & Plan Note (Signed)
No more falls since more regularly using wheelchair.

## 2014-11-27 NOTE — Patient Instructions (Addendum)
Try doubling up on protonix 34m to twice daily for 1 week - if helpful, continue this until you see GI.  We will set you up for boniva infusion every 3 months.  Recheck dexa scan prior to starting boniva  Add 1000 units vit D daily. Return in 6 months for follow up , sooner if needed

## 2014-11-27 NOTE — Assessment & Plan Note (Signed)
Chronic, stable on current regimen. Lost daughter last year

## 2014-11-27 NOTE — Assessment & Plan Note (Signed)
Preventative protocols reviewed and updated unless pt declined. Discussed healthy diet and lifestyle.  

## 2014-11-27 NOTE — Progress Notes (Signed)
BP 116/60 mmHg  Pulse 72  Temp(Src) 97.5 F (36.4 C) (Oral)  Wt 151 lb 4 oz (68.607 kg)   CC: Medicare wellness visit Subjective:    Patient ID: [name] [name], female    DOB: Mar 22, 1941, 74 y.o.   MRN: [id number]  HPI: [name] [name] is a 74 y.o. 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  Recent GI sxs thought worsening GERD. Has f/u appt with GI next month. Recently started on protonix 60m daily and pepcid bid. linzess caused diarrhea. No nausea/vomiting or dysphagia  MS[facility] but planning on just returning for f/u with local neurologist.  Eye exam 09/2014. Wears hearing aides. High risk for falls - 2+ falls 2/2 MSA. Regularly uses wheelchair. Falls have happened while using walker.  Denies depression/anhedonia or sadness.  Preventative: COLONOSCOPY Laterality: N/A Date: 08/29/2013 int hemm; RInda Castle MD mammo 02/2014 -Minus Breeding gets yearly. Well woman - last pelvic exam was 5 yrs ago. Stopped pap smears 5 yrs ago, overall normal paps in past DEXA 2012 - osteoporosis T-2.7 hip, -3.6 spine - due for rpt. Has not been on bone replacement therapy. Flu shot at [facility] 08/2013 prevnar 12/2013, pneumovax 1999, 2009 Td 2009 Zostavax 2011 Advanced planning - has at home. Will bring me a copy. Husband is HCPOA.   HSG, retired from [employer]. married '60. 1 son - '65; 1 daughter - '61; 2 grandchildren; 1 great-grandchild. Homemaker. marriage in good health. 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 Positive history of passive tobacco smoke exposure. Caffeine Use: 1 cup daily; 2 glasses of tea daily  Relevant past medical, surgical, family and social history reviewed and updated as indicated. Interim medical history since our last visit reviewed. Allergies and medications reviewed and updated. Current Outpatient  Prescriptions on File Prior to Visit  Medication Sig  . AFLURIA PRESERVATIVE FREE injection   . albuterol (PROAIR HFA) 108 (90 BASE) MCG/ACT inhaler Inhale 2 puffs into the lungs every 6 (six) hours as needed for wheezing or shortness of breath.   .Marland Kitchenalum & mag hydroxide-simeth (MYLANTA) 2827-078-67MG/5ML suspension Take 15 mLs by mouth every 6 (six) hours as needed for indigestion or heartburn.  .Marland Kitchenaspirin (ASPIRIN EC) 81 MG EC tablet Take 81 mg by mouth 2 (two) times a week. Swallow whole.  . beclomethasone (QVAR) 80 MCG/ACT inhaler Inhale 1 puff into the lungs daily as needed (wheezing).   . Calcium Carbonate-Vitamin D (CALCIUM-VITAMIN D) 500-200 MG-UNIT per tablet Take 1 tablet by mouth daily.    . clonazePAM (KLONOPIN) 0.5 MG tablet TAKE 1-2 TABLETS BY MOUTH ONCE A DAY AS NEEDED  . cyanocobalamin (,VITAMIN B-12,) 1000 MCG/ML injection Inject 1,000 mcg into the muscle every 30 (thirty) days.    . famotidine (PEPCID) 40 MG tablet Take 1 tablet (40 mg total) by mouth 2 (two) times daily as needed for heartburn.  . fluocinolone (VANOS) 0.01 % cream Apply 1 application topically 2 (two) times daily as needed (itching).   . furosemide (LASIX) 20 MG tablet Take 20 mg by mouth daily as needed for fluid or edema. For severe swelling with discomfort.  . hydrOXYzine (ATARAX/VISTARIL) 10 MG tablet Take 10 mg by mouth 3 (three) times daily as needed for itching.  . isosorbide mononitrate (IMDUR) 30 MG 24 hr tablet TAKE 1/2 TABLET BY  MOUTH DAILY  . loratadine (CLARITIN) 10 MG tablet Take 10 mg by mouth 2 (two) times daily as needed.   . mometasone (NASONEX) 50 MCG/ACT nasal spray Place 2 sprays into the nose daily as needed (allergies).   . nitroGLYCERIN (NITROSTAT) 0.4 MG SL tablet Place 0.4 mg under the tongue every 5 (five) minutes as needed.    . pantoprazole (PROTONIX) 40 MG tablet Take 1 tablet (40 mg total) by mouth daily. Use 30 minutes before your first meal of the day (Patient taking differently:  Take 40 mg by mouth 2 (two) times daily. )  . propranolol (INDERAL) 10 MG tablet Take 10 mg by mouth 3 (three) times daily as needed (tremors).  . rosuvastatin (CRESTOR) 20 MG tablet Take 0.5 tablets (10 mg total) by mouth every evening.  . sertraline (ZOLOFT) 25 MG tablet TAKE 1 TABLET (25 MG TOTAL) BY MOUTH EVERY MORNING.   No current facility-administered medications on file prior to visit.    Review of Systems  Constitutional: Negative for fever, chills, activity change, appetite change, fatigue and unexpected weight change.  HENT: Negative for hearing loss.   Eyes: Negative for visual disturbance.  Respiratory: Negative for cough, chest tightness, shortness of breath and wheezing.   Cardiovascular: Negative for chest pain, palpitations and leg swelling.  Gastrointestinal: Negative for nausea, vomiting, abdominal pain, diarrhea, constipation, blood in stool and abdominal distention.  Genitourinary: Negative for hematuria and difficulty urinating.  Musculoskeletal: Negative for myalgias, arthralgias and neck pain.  Skin: Negative for rash.  Neurological: Negative for dizziness, seizures, syncope and headaches.  Hematological: Negative for adenopathy. Does not bruise/bleed easily.  Psychiatric/Behavioral: Negative for dysphoric mood. The patient is not nervous/anxious.    Per HPI unless specifically indicated above     Objective:    BP 116/60 mmHg  Pulse 72  Temp(Src) 97.5 F (36.4 C) (Oral)  Wt 151 lb 4 oz (68.607 kg)  Wt Readings from Last 3 Encounters:  11/27/14 151 lb 4 oz (68.607 kg)  09/19/14 144 lb 12.8 oz (65.681 kg)  06/05/14 148 lb (67.132 kg)    Physical Exam  Constitutional: She is oriented to person, place, and time. She appears well-developed and well-nourished. No distress.  HENT:  Head: Normocephalic and atraumatic.  Right Ear: Hearing, tympanic membrane, external ear and ear canal normal.  Left Ear: Hearing, tympanic membrane, external ear and ear canal  normal.  Nose: Nose normal.  Mouth/Throat: Uvula is midline, oropharynx is clear and moist and mucous membranes are normal. No oropharyngeal exudate, posterior oropharyngeal edema or posterior oropharyngeal erythema.  Eyes: Conjunctivae and EOM are normal. Pupils are equal, round, and reactive to light. No scleral icterus.  Neck: Normal range of motion. Neck supple. Carotid bruit is not present. No thyromegaly present.  Cardiovascular: Normal rate, regular rhythm, normal heart sounds and intact distal pulses.   No murmur heard. Pulses:      Radial pulses are 2+ on the right side, and 2+ on the left side.  Pulmonary/Chest: Effort normal and breath sounds normal. No respiratory distress. She has no wheezes. She has no rales.  Breast - deferred  Abdominal: Soft. Bowel sounds are normal. She exhibits no distension and no mass. There is no tenderness. There is no rebound and no guarding.  Genitourinary:  GYN - deferred  Musculoskeletal: Normal range of motion. She exhibits no edema.  Lymphadenopathy:    She has no cervical adenopathy.  Neurological: She is alert and oriented to person, place, and time.  CN  grossly intact,  Tremor present In wheelchair Recall 3/3 Calculation 5/5 serial 3s  Skin: Skin is warm and dry. No rash noted.  Psychiatric: She has a normal mood and affect. Her behavior is normal. Judgment and thought content normal.  Nursing note and vitals reviewed.  Results for orders placed or performed in visit on 11/20/14  Lipid panel  Result Value Ref Range   Cholesterol 130 0 - 200 mg/dL   Triglycerides 115.0 0.0 - 149.0 mg/dL   HDL 43.90 >39.00 mg/dL   VLDL 23.0 0.0 - 40.0 mg/dL   LDL Cholesterol 63 0 - 99 mg/dL   Total CHOL/HDL Ratio 3    NonHDL 86.10   Comprehensive metabolic panel  Result Value Ref Range   Sodium 136 135 - 145 mEq/L   Potassium 3.9 3.5 - 5.1 mEq/L   Chloride 105 96 - 112 mEq/L   CO2 29 19 - 32 mEq/L   Glucose, Bld 112 (H) 70 - 99 mg/dL   BUN 18  6 - 23 mg/dL   Creatinine, Ser 0.8 0.4 - 1.2 mg/dL   Total Bilirubin 0.8 0.2 - 1.2 mg/dL   Alkaline Phosphatase 113 39 - 117 U/L   AST 20 0 - 37 U/L   ALT 18 0 - 35 U/L   Total Protein 6.6 6.0 - 8.3 g/dL   Albumin 3.9 3.5 - 5.2 g/dL   Calcium 8.9 8.4 - 10.5 mg/dL   GFR 76.62 >60.00 mL/min  TSH  Result Value Ref Range   TSH 8.76 (H) 0.35 - 4.50 uIU/mL      Assessment & Plan:   Problem List Items Addressed This Visit    Routine health maintenance    Preventative protocols reviewed and updated unless pt declined. Discussed healthy diet and lifestyle.       Recurrent falls    No more falls since more regularly using wheelchair.      Osteoporosis    Reviewed latest DEXA 2012  Has not been on bisphosphonate therapy. Worsening GERD and s/p nissen preclude oral therapy. Will recheck DEXA and then schedule for IV boniva. Pt agrees with plan. Reviewed calcium/vit D intake, rec she start vit D 1000 IU daily.      Relevant Medications   cholecalciferol (VITAMIN D) 1000 UNITS tablet   Multiple system atrophy    Continue to f/u with [facility].       Medicare annual wellness visit, subsequent - Primary    I have personally reviewed the Medicare Annual Wellness questionnaire and have noted 1. The patient's medical and social history 2. Their use of alcohol, tobacco or illicit drugs 3. Their current medications and supplements 4. The patient's functional ability including ADL's, fall risks, home safety risks and hearing or visual impairment. 5. Diet and physical activity 6. Evidence for depression or mood disorders The patients weight, height, BMI have been recorded in the chart.  Hearing and vision has been addressed. I have made referrals, counseling and provided education to the patient based review of the above and I have provided the pt with a written personalized care plan for preventive services. Provider list updated - see scanned questionairre. Reviewed preventative protocols  and updated unless pt declined. Marked difficulty with ADLs      Hypothyroidism    TSH elevated today - will increase brand synthroid to 171mg daily. rec recheck levels in 4-6 wks.      Relevant Medications   SYNTHROID 112 MCG tablet   HYPERCHOLESTEROLEMIA  IIA  Chronic, stable. Continue crestor 37m 1/2 tab nightly      GERD    Worsening GERD despite daily PPI and pepcid bid. No red flags.  S/p nissen, ?loosening of surgery. I suggested increase PPI to BID dosing for 1 wk and if helpful continue this until seen by GI.      Depression    Chronic, stable on current regimen. Lost daughter last year      Advanced care planning/counseling discussion    Advanced planning - has at home. Will bring me a copy. Husband is HCPOA.           Follow up plan: Return in about 6 months (around 05/28/2015), or as needed, for follow up visit.

## 2014-11-27 NOTE — Assessment & Plan Note (Signed)
Continue to f/u with [facility].

## 2014-11-27 NOTE — Assessment & Plan Note (Signed)
Reviewed latest DEXA 2012  Has not been on bisphosphonate therapy. Worsening GERD and s/p nissen preclude oral therapy. Will recheck DEXA and then schedule for IV boniva. Pt agrees with plan. Reviewed calcium/vit D intake, rec she start vit D 1000 IU daily.

## 2014-11-27 NOTE — Assessment & Plan Note (Signed)
I have personally reviewed the Medicare Annual Wellness questionnaire and have noted 1. The patient's medical and social history 2. Their use of alcohol, tobacco or illicit drugs 3. Their current medications and supplements 4. The patient's functional ability including ADL's, fall risks, home safety risks and hearing or visual impairment. 5. Diet and physical activity 6. Evidence for depression or mood disorders The patients weight, height, BMI have been recorded in the chart.  Hearing and vision has been addressed. I have made referrals, counseling and provided education to the patient based review of the above and I have provided the pt with a written personalized care plan for preventive services. Provider list updated - see scanned questionairre. Reviewed preventative protocols and updated unless pt declined. Marked difficulty with ADLs

## 2014-11-27 NOTE — Telephone Encounter (Signed)
Can we set pt up for boniva infusion? 56m IV Q3 mo. Indication osteoporosis, contraindication to PO bisphosphonate - worsening GERD s/p nissen.  Would like to set this up after updated DEXA scan this year. Thanks

## 2014-11-27 NOTE — Assessment & Plan Note (Signed)
Advanced planning - has at home. Will bring me a copy. Husband is HCPOA.

## 2014-11-27 NOTE — Assessment & Plan Note (Signed)
Worsening GERD despite daily PPI and pepcid bid. No red flags.  S/p nissen, ?loosening of surgery. I suggested increase PPI to BID dosing for 1 wk and if helpful continue this until seen by GI.

## 2014-11-27 NOTE — Assessment & Plan Note (Signed)
Chronic, stable. Continue crestor 40m 1/2 tab nightly

## 2014-11-27 NOTE — Assessment & Plan Note (Signed)
TSH elevated today - will increase brand synthroid to 177mg daily. rec recheck levels in 4-6 wks.

## 2014-11-28 NOTE — Telephone Encounter (Signed)
Order in your IN box for completion.

## 2014-12-03 ENCOUNTER — Ambulatory Visit: Payer: Medicare Other | Admitting: Physical Therapy

## 2014-12-03 ENCOUNTER — Encounter: Payer: Self-pay | Admitting: Physical Therapy

## 2014-12-03 DIAGNOSIS — R29898 Other symptoms and signs involving the musculoskeletal system: Secondary | ICD-10-CM

## 2014-12-03 DIAGNOSIS — E785 Hyperlipidemia, unspecified: Secondary | ICD-10-CM | POA: Diagnosis not present

## 2014-12-03 DIAGNOSIS — R269 Unspecified abnormalities of gait and mobility: Secondary | ICD-10-CM | POA: Diagnosis not present

## 2014-12-03 DIAGNOSIS — R278 Other lack of coordination: Secondary | ICD-10-CM | POA: Diagnosis not present

## 2014-12-03 DIAGNOSIS — K219 Gastro-esophageal reflux disease without esophagitis: Secondary | ICD-10-CM | POA: Diagnosis not present

## 2014-12-03 DIAGNOSIS — I2510 Atherosclerotic heart disease of native coronary artery without angina pectoris: Secondary | ICD-10-CM | POA: Diagnosis not present

## 2014-12-03 NOTE — Therapy (Signed)
[facility] [address] [place], [place], [address] Phone: [phone]   Fax:  [phone]  Physical Therapy Treatment  Patient Details  Name: [name] [name] MRN: [id number] Date of Birth: August 26, 1941 Referring Provider:  Ria Bush, MD  Encounter Date: 12/03/2014      PT End of Session - 12/03/14 1827    Visit Number 11  G1   Number of Visits 17   Date for PT Re-Evaluation 12/06/14   PT Start Time 1320   PT Stop Time 1405   PT Time Calculation (min) 45 min   Equipment Utilized During Treatment Gait belt      Past Medical History  Diagnosis Date  . GERD (gastroesophageal reflux disease)     s/p nissen  . Familial tremor     followed by Dr. Erling Cruz  . Unspecified chronic bronchitis   . Irritable bladder   . History of pneumonia   . Hypothyroidism   . Depression   . Asthma   . CAD (coronary artery disease)     myoview 5/08:  EF 76%, no scar, no ischemia, +ECG changes with exercise;  cath 04/08/07:  pLAD 30%, mLAD 60-70% - med tx.  Marland Kitchen HLD (hyperlipidemia)   . Multiple system atrophy     [name]  . VAGINITIS, ATROPHIC 11/07/2007  . Osteoporosis 2012    DEXA T-2.7 hip, -3.6 spine    Past Surgical History  Procedure Laterality Date  . Laparoscopic nissen fundoplication    . Cystectomy    . Tumor removal    . Tubal ligation    . Colonoscopy N/A 08/29/2013    int hemm; Inda Castle, MD    There were no vitals taken for this visit.  Visit Diagnosis:  Abnormality of gait  Right leg weakness      Subjective Assessment - 12/03/14 1825    Symptoms Pt. says she is stiff today - hasnt been to therapy since last week and hasnt been able to walk very much   Currently in Pain? No/denies                    [facility] Adult PT Treatment/Exercise - 12/03/14 0001    Transfers   Transfers Sit to Stand;Stand to Sit   Sit to Stand 4: Min guard;4: Min assist   Stand to Sit 4: Min  guard   Ambulation/Gait   Ambulation/Gait Yes   Ambulation/Gait Assistance 4: Min guard   Ambulation Distance (Feet) 360 Feet   Assistive device Rolling walker   Gait Pattern Step-to pattern   Ambulation Surface Level   Gait velocity --   High Level Balance   High Level Balance Activities Marching forwards;Other (comment);Side stepping  Marching in parallel bars and staggered stance weight shift       Stepping over and back of 1/2 bolster on floor with bil. UE support inside bars; standing unsupported with CGA with trunk Rotation x 3 reps each side; alternate stepping forward /back and forward kicks x 10 reps each  Scifit level 1.5 x 15" with bil. UE's and LE's (no charge as unsupervised)             PT Short Term Goals - 11/27/14 1458    PT SHORT TERM GOAL #1   Title Pt. will increase standing tolerance so she is able to report standing 5" at home with S for independence with ADL's.   Baseline met 11-19-14   Time 4  Period Weeks   Status Achieved   PT SHORT TERM GOAL #2   Title Increase distance in 3" walk test by at least 50' for increased amb. speed and endurance.   Time 4   Period Weeks   Status Not Met   PT SHORT TERM GOAL #3   Title Transfer sit to stand from wheelchair with CGA   Baseline met 11-19-14   Time 4   Period Weeks   Status Achieved   PT SHORT TERM GOAL #4   Title Independent in HEP    Baseline 11-15-14   Time 4   Period Weeks   Status Achieved           PT Long Term Goals - 10/16/14 2121    PT LONG TERM GOAL #1   Title Pt. will ambulate 360' nonstop with RW to demo incr. endurance with CGA   Baseline 240' with min A    11-15-14   Time 8   Period Weeks   Status New   PT LONG TERM GOAL #2   Title Report ability to stand at least 10" at home for improved standing tolerance   Baseline 3-4" with c/o pain in legs   11-15-14   Time 8   Period Weeks   Status New   PT LONG TERM GOAL #3   Title Report at least 25% less pain in RLE with  weight-bearing   Baseline 11-15-14   Time 8   Period Weeks   Status New   PT LONG TERM GOAL #4   Title Perform at least 10" on Nustep to demo improved activity tolerance/endurance   Baseline 11-15-14   Time 8   Period Weeks   Status New               Plan - 12/03/14 1832    Clinical Impression Statement Pt. has decreased motor control and decr. coordination in RLE with freezing episode noted with acitivity of stepping over and back of bolster on floor   Pt will benefit from skilled therapeutic intervention in order to improve on the following deficits Abnormal gait;Decreased coordination;Impaired flexibility;Difficulty walking;Decreased balance;Increased muscle spasms;Impaired tone;Decreased mobility;Decreased strength   Rehab Potential Good   PT Frequency 2x / week   PT Duration 2 weeks   PT Treatment/Interventions ADLs/Self Care Home Management;Therapeutic activities;Patient/family education;Therapeutic exercise;Gait training;Balance training;Stair training;Neuromuscular re-education;Functional mobility training   PT Next Visit Plan check LTG's and renew   PT Home Exercise Plan as instructed   Consulted and Agree with Plan of Care Patient        Problem List Patient Active Problem List   Diagnosis Date Noted  . Medicare annual wellness visit, subsequent 11/27/2014  . Advanced care planning/counseling discussion 11/27/2014  . Osteoporosis   . B-complex deficiency 06/07/2014  . Recurrent falls 02/06/2014  . Multiple system atrophy   . Dysphagia, unspecified(787.20) 07/28/2013  . Lower extremity edema 09/26/2012  . Shoulder pain, left 07/20/2012  . 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 Routine health maintenance 08/26/2011  . Murmur 05/27/2011  . HYPERCHOLESTEROLEMIA  IIA 05/14/2010  . VENOUS INSUFFICIENCY, LEGS 06/21/2009  . CORONARY HEART DISEASE 06/09/2008  . Hypothyroidism 05/15/2008  . ASTHMA 05/15/2008  . Depression 11/07/2007  . 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  GERD 11/07/2007  . VAGINITIS, ATROPHIC 11/07/2007    Alda Lea, PT 12/03/2014, 6:41 PM  [facility] [address] [place], [place], [address] Phone: [phone]   Fax:  [phone]

## 2014-12-06 ENCOUNTER — Ambulatory Visit: Payer: Medicare Other | Admitting: Physical Therapy

## 2014-12-06 DIAGNOSIS — E785 Hyperlipidemia, unspecified: Secondary | ICD-10-CM | POA: Diagnosis not present

## 2014-12-06 DIAGNOSIS — K219 Gastro-esophageal reflux disease without esophagitis: Secondary | ICD-10-CM | POA: Diagnosis not present

## 2014-12-06 DIAGNOSIS — I2510 Atherosclerotic heart disease of native coronary artery without angina pectoris: Secondary | ICD-10-CM | POA: Diagnosis not present

## 2014-12-06 DIAGNOSIS — R278 Other lack of coordination: Secondary | ICD-10-CM | POA: Diagnosis not present

## 2014-12-06 DIAGNOSIS — R269 Unspecified abnormalities of gait and mobility: Secondary | ICD-10-CM

## 2014-12-06 DIAGNOSIS — R29898 Other symptoms and signs involving the musculoskeletal system: Secondary | ICD-10-CM

## 2014-12-07 ENCOUNTER — Encounter: Payer: Self-pay | Admitting: Physical Therapy

## 2014-12-07 NOTE — Therapy (Signed)
[facility] [address] [place], [place], [address] Phone: [phone]   Fax:  [phone]  Physical Therapy Treatment  Patient Details  Name: [name] [name] MRN: [id number] Date of Birth: [birth year] Referring Provider:  Ria Bush, MD  Encounter Date: 12/06/2014      PT End of Session - 12/07/14 0932    Visit Number 12   Number of Visits 17   Date for PT Re-Evaluation 12/14/14   PT Start Time 8786   PT Stop Time 1628   PT Time Calculation (min) 53 min   Equipment Utilized During Treatment Gait belt      Past Medical History  Diagnosis Date  . GERD (gastroesophageal reflux disease)     s/p nissen  . Familial tremor     followed by Dr. Erling Cruz  . Unspecified chronic bronchitis   . Irritable bladder   . History of pneumonia   . Hypothyroidism   . Depression   . Asthma   . CAD (coronary artery disease)     myoview 5/08:  EF 76%, no scar, no ischemia, +ECG changes with exercise;  cath 04/08/07:  pLAD 30%, mLAD 60-70% - med tx.  Marland Kitchen HLD (hyperlipidemia)   . Multiple system atrophy     [name]  . VAGINITIS, ATROPHIC 11/07/2007  . Osteoporosis 2012    DEXA T-2.7 hip, -3.6 spine    Past Surgical History  Procedure Laterality Date  . Laparoscopic nissen fundoplication    . Cystectomy    . Tumor removal    . Tubal ligation    . Colonoscopy N/A 08/29/2013    int hemm; Inda Castle, MD    There were no vitals taken for this visit.  Visit Diagnosis:  Abnormality of gait  Right leg weakness      Subjective Assessment - 12/07/14 0931    Symptoms "doing ok" ; no new problems reported   Currently in Pain? No/denies     TherEx: stretching for R and L hamstring and heel cord passively - pt. In seated position - 60 sec hold each LE Stretching for R and L sides of trunk in seated position with trunk rotation - 60 sec hold in seated position with UE Overhead for incr. Stretch Sit to  stand without use of UE's with CGA to min assist from mat x 5 reps  Gait; with RW 475' with CGA to SBA with cues for posture and step length; ambulating forward and bacward inside  Bars 10' x 4 reps with CGA  Neuro-Re-ed: BIG exercises seated - reaching down & up x 10 reps; seated trunk rotation with clapping opposite hand  x 10 reps each; standing stepping over and back of 1/2 bolster inside bars with bil. UE support with CGA;  rockerboard  with bil. UE support anterior/posteriorly with min assist Tapping 2",4" steps with RLE and with LLE for coordination and motor planning/feedforward activity Marching in place with UE support prn with CGA                         PT Short Term Goals - 11/27/14 1458    PT SHORT TERM GOAL #1   Title Pt. will increase standing tolerance so she is able to report standing 5" at home with S for independence with ADL's.   Baseline met 11-19-14   Time 4   Period Weeks   Status Achieved   PT SHORT TERM GOAL #2  Title Increase distance in 3" walk test by at least 50' for increased amb. speed and endurance.   Time 4   Period Weeks   Status Not Met   PT SHORT TERM GOAL #3   Title Transfer sit to stand from wheelchair with CGA   Baseline met 11-19-14   Time 4   Period Weeks   Status Achieved   PT SHORT TERM GOAL #4   Title Independent in HEP    Baseline 11-15-14   Time 4   Period Weeks   Status Achieved           PT Long Term Goals - 10/16/14 2121    PT LONG TERM GOAL #1   Title Pt. will ambulate 360' nonstop with RW to demo incr. endurance with CGA   Baseline 240' with min A    11-15-14   Time 8   Period Weeks   Status New   PT LONG TERM GOAL #2   Title Report ability to stand at least 10" at home for improved standing tolerance   Baseline 3-4" with c/o pain in legs   11-15-14   Time 8   Period Weeks   Status New   PT LONG TERM GOAL #3   Title Report at least 25% less pain in RLE with weight-bearing   Baseline 11-15-14    Time 8   Period Weeks   Status New   PT LONG TERM GOAL #4   Title Perform at least 10" on Nustep to demo improved activity tolerance/endurance   Baseline 11-15-14   Time 8   Period Weeks   Status New               Plan - 12/07/14 0935    Clinical Impression Statement Pt. has significant tightness in trunk and in RLE - some incr. flexibility achieved after stretching; motor control noted to improve in RLE after practice & repetition   Pt will benefit from skilled therapeutic intervention in order to improve on the following deficits Abnormal gait;Decreased coordination;Impaired flexibility;Difficulty walking;Decreased balance;Increased muscle spasms;Impaired tone;Decreased mobility;Decreased strength   Rehab Potential Good   PT Frequency 2x / week   PT Duration --  1 week   PT Treatment/Interventions ADLs/Self Care Home Management;Therapeutic activities;Patient/family education;Therapeutic exercise;Gait training;Balance training;Stair training;Neuromuscular re-education;Functional mobility training   PT Next Visit Plan check LTG's next week and renew   PT Home Exercise Plan as instructed   Consulted and Agree with Plan of Care Patient        Problem List Patient Active Problem List   Diagnosis Date Noted  . Medicare annual wellness visit, subsequent 11/27/2014  . Advanced care planning/counseling discussion 11/27/2014  . Osteoporosis   . B-complex deficiency 06/07/2014  . Recurrent falls 02/06/2014  . Multiple system atrophy   . Dysphagia, unspecified(787.20) 07/28/2013  . Lower extremity edema 09/26/2012  . Shoulder pain, left 07/20/2012  . 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 Routine health maintenance 08/26/2011  . Murmur 05/27/2011  . HYPERCHOLESTEROLEMIA  IIA 05/14/2010  . VENOUS INSUFFICIENCY, LEGS 06/21/2009  . CORONARY HEART DISEASE 06/09/2008  . Hypothyroidism 05/15/2008  . ASTHMA 05/15/2008  . Depression 11/07/2007  . 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 GERD  11/07/2007  . VAGINITIS, ATROPHIC 11/07/2007    Alda Lea, PT 12/07/2014, 9:39 AM  [facility] [address] [place], [place], [address] Phone: [phone]   Fax:  [phone]

## 2014-12-10 ENCOUNTER — Telehealth: Payer: Self-pay | Admitting: Gastroenterology

## 2014-12-10 ENCOUNTER — Ambulatory Visit: Payer: Medicare Other | Admitting: Physical Therapy

## 2014-12-10 HISTORY — PX: OTHER SURGICAL HISTORY: SHX169

## 2014-12-10 NOTE — Telephone Encounter (Signed)
She has had 1 spell of loose stool. She is more concerned that she is having more frequent abdominal pain. She has "so many other problems" she is worried something is wrong. Appointment made for Friday morning at 10:30 am. No other openings, but will monitor the schedule.

## 2014-12-11 ENCOUNTER — Ambulatory Visit (INDEPENDENT_AMBULATORY_CARE_PROVIDER_SITE_OTHER): Payer: Medicare Other | Admitting: Neurology

## 2014-12-11 ENCOUNTER — Encounter: Payer: Self-pay | Admitting: Neurology

## 2014-12-11 VITALS — BP 128/72 | HR 70 | Temp 97.0°F | Ht 62.0 in | Wt 151.0 lb

## 2014-12-11 DIAGNOSIS — E538 Deficiency of other specified B group vitamins: Secondary | ICD-10-CM

## 2014-12-11 DIAGNOSIS — R296 Repeated falls: Secondary | ICD-10-CM | POA: Diagnosis not present

## 2014-12-11 DIAGNOSIS — K5900 Constipation, unspecified: Secondary | ICD-10-CM

## 2014-12-11 DIAGNOSIS — G239 Degenerative disease of basal ganglia, unspecified: Secondary | ICD-10-CM

## 2014-12-11 DIAGNOSIS — R197 Diarrhea, unspecified: Secondary | ICD-10-CM | POA: Diagnosis not present

## 2014-12-11 DIAGNOSIS — G903 Multi-system degeneration of the autonomic nervous system: Secondary | ICD-10-CM

## 2014-12-11 DIAGNOSIS — K5909 Other constipation: Secondary | ICD-10-CM

## 2014-12-11 MED ORDER — CYANOCOBALAMIN 1000 MCG/ML IJ SOLN
1000.0000 ug | Freq: Once | INTRAMUSCULAR | Status: AC
  Administered 2014-12-11: 1000 ug via INTRAMUSCULAR

## 2014-12-11 NOTE — Patient Instructions (Signed)
  You can try Melatonin at night for sleep: take 3 to 5 mg one to 2 hours before your bedtime.   Please remember to try to maintain good sleep hygiene, which means: Keep a regular sleep and wake schedule, try not to exercise or have a meal within 2 hours of your bedtime, try to keep your bedroom conducive for sleep, that is, cool and dark, without light distractors such as an illuminated alarm clock, and refrain from watching TV right before sleep or in the middle of the night and do not keep the TV or radio on during the night. Also, try not to use or play on electronic devices at bedtime, such as your cell phone, tablet PC or laptop. If you like to read at bedtime on an electronic device, try to dim the background light as much as possible. Do not eat in the middle of the night.

## 2014-12-11 NOTE — Progress Notes (Signed)
Subjective:    Patient ID: [name] [name] is a 74 y.o. female.  HPI     Interim history:   [name] [name] is a very pleasant 74year-old right-handed woman with an underlying complex medical history of thyroid disease, [place] spotted fever at age 18, asthma, cardiac disease, essential tremor, hearing loss, hyperlipidemia, reflux disease with surgical repair, who presents for followup consultation of her gait disorder, essential tremor, and recurrent falls, concern for MSA. She is accompanied by her husband again today. I last saw her on 07/05/2014, at which time she reported having had more falls. She had not been seen at the [facility] clinic. Stem cell trial was discussed and another trial in [place] with alpha-synuclein depleting therapy at the last visit there. She was eating well and her spirits were good. We restarted outpatient physical therapy and she has been going to the neuro rehabilitation unit.   Today, she reports feeling about the same as far as her motor symptoms are concerned. She does note more freezing especially in her right leg. She has been benefiting from physical therapy and is very pleased with her progress. She tries to do stretches at home. She received a phone call from then [facility] as far as updates on researching that was nothing particularly new to discuss. Her husband does not like to leave her at the house alone. If he does she has her cell phone at hand and a call alert button. She has thankfully not fallen recently but does note that she can no longer hold onto the counter and do her stretches for fear of falling. She has no recent choking episodes or problems swallowing. Her speech is about the same. She does not sleep well. She has trouble going to sleep. She sleeps in her lift chair. She settles down for the night around 9:30 or 10 PM but it may be hours before she can actually go to sleep. She likes to use her computer on a tray and sometimes watches TV or reads a  book. She had her eyes checked and now has reading glasses which improved her blurry vision. She does not need to go back to the eye doctor for another year or so. She has had diarrhea yesterday. It may have been something she ate. She also had diarrhea in the past when she tried Linzess through her GI doctor. She has an appointment to see him this week.  I saw her on 02/02/2014, at which time she reported that physical therapy was helpful. She had fallen a few times and bruised her ribs. She had a colonoscopy on 08/29/2013. She reported a history of hemorrhoids and chronic constipation for which she had tried Dulcolax. I referred her back to physical therapy. 4 chronic constipation I asked her to try Linzess. I did not suggest any other new medications. [facility] and had an appointment in on 04/27/14 and I reviewed the note and shared the data with them. She had previously tried and failed Sinemet.   I saw her on 09/05/2013, at which time she presented for a sooner than scheduled appointment due to recurrent falls. She had fallen 6 times within about 1-1/2 weeks' time. 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 I advised her to use her walker at all times. We talked about potentially going to a motorized wheelchair down  the Road. She was advised to drink more water. She had tried and failed Sinemet in the past. I suggested a second opinion at [facility] which they declined. I referred her to physical and occupational therapy outpatient. In the interim, I prescribed a light weight wheelchair for her. Her daughter recently died. She had advanced MS.   I first met her on 05/10/2013, at which time we discussed the possibility of MSA. I did not make any medication changes. We talked about the nature of progressive parkinsonism and gait disorder. She had her last outpatient PT in  July 2014. She tried to do stretching at home. She had a colonoscopy.   She previously followed with Dr. Morene Antu and was last seen by him on 12/30/2012, at which time he felt that she had worsening of her gait. She also had symptoms of vertigo. She has been on baby aspirin, Synthroid, Crestor, and/or, clonazepam, propranolol, sertraline, trimethoprim, Nasonex, Provera, nitroglycerin as needed, vitamin B12.   She was diagnosed about 20 years ago with essential tremor. She had head tremor more than upper extremity tremor. She has a history of lung disease but was able to take low-dose propranolol. She has not tried primidone. She has had gait and balance problems in the last 5+ years approximately. She has to hold onto something. She has had bladder incontinence since her 10s. She was on Crestor for 3 years which was discontinued in June 2011. CK was normal and EMG nerve conduction studies were normal in June 2011. Crestor was restarted. She has been on B12 injections without subjective improvement. MRI brain with and without contrast showed hyperostosis frontalis, MRI C-spine showed mild degenerative joint disease. 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 Lumbar spine MRI from July 2012 showed mild degenerative disc disease. 4 gait dysfunction she was referred to Dr. Gilford Rile with a question of myelopathy versus orthostatic tremor versus lower half parkinsonism versus essential tremor with gait disorder. 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 Further testing revealed negative tilt table test but there was mild autonomic neuropathy per autonomic reflex screen. She has had swelling in both legs with negative Doppler of the legs and normal echocardiogram.  Additional workup at [facility] included vitamin D, paraneoplastic antibodies, anti-GAD, all negative.   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 She has been using a 2 wheeled walker for the past 2 years.   She has been in PT and OT with improvement.   She endorsed a lot of stress, due primarily to her mother's and daughter's health, who died in 02-10-14. Mother is in her 2s and in a NH. She sleeps in a recliner, d/t pain in her trunk, back, neck and stiffness and difficulty getting in and out of bed.  Her Past Medical History Is Significant For: Past Medical History  Diagnosis Date  . GERD (gastroesophageal reflux disease)     s/p nissen  . Familial tremor     followed by Dr. Erling Cruz  . Unspecified chronic bronchitis   . Irritable bladder   . History of pneumonia   . Hypothyroidism   . Depression   . Asthma   .  CAD (coronary artery disease)     myoview 5/08:  EF 76%, no scar, no ischemia, +ECG changes with exercise;  cath 04/08/07:  pLAD 30%, mLAD 60-70% - med tx.  Marland Kitchen HLD (hyperlipidemia)   . Multiple system atrophy     [name]  . VAGINITIS, ATROPHIC 11/07/2007  . Osteoporosis 2012    DEXA T-2.7 hip, -3.6 spine    Her Past Surgical History Is Significant For: Past Surgical History  Procedure Laterality Date  . Laparoscopic nissen fundoplication    . Cystectomy    . Tumor removal    . Tubal ligation    . Colonoscopy N/A 08/29/2013    int hemm; Inda Castle, MD    Her Family History Is Significant For: Family History  Problem Relation Age of Onset  . Coronary artery disease    . Heart attack    . Hypertension    . Diabetes    . Breast cancer    . Hypertension Mother   . Heart disease Father     Her Social History Is Significant For: History   Social History  . Marital Status: Married    Spouse Name: [name] [name]    Number of Children: 2  . Years of Education: HS   Occupational History  .  Other    Social History Main Topics  . Smoking status: Never Smoker   . Smokeless tobacco: Never Used  . Alcohol Use: No  . Drug Use: No  . Sexual Activity: Not Currently   Other Topics Concern  . None   Social History Narrative   HSG, retired from [employer] . married '60. 1 son - '65; 1 daughter - '61; 2 grandchildren; 1 great-grandchild. Homemaker. marriage in good health. 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 Positive history of passive tobacco smoke exposure.   Caffeine Use: 1 cup daily; 2 glasses of tea daily    Her Allergies Are:  No Known Allergies:   Her Current Medications Are:  Outpatient Encounter Prescriptions as of 12/11/2014  Medication Sig  . AFLURIA PRESERVATIVE FREE injection   . albuterol (PROAIR HFA) 108 (90 BASE) MCG/ACT inhaler Inhale 2 puffs into the lungs every 6 (six) hours as needed for wheezing or shortness of breath.   Marland Kitchen alum & mag hydroxide-simeth (MYLANTA) 536-144-31 MG/5ML suspension Take 15 mLs by mouth every 6 (six) hours as needed for indigestion or heartburn.  Marland Kitchen aspirin (ASPIRIN EC) 81 MG EC tablet Take 81 mg by mouth 2 (two) times a week. Swallow whole.  . beclomethasone (QVAR) 80 MCG/ACT inhaler Inhale 1 puff into the lungs daily as needed (wheezing).   . Calcium Carbonate-Vitamin D (CALCIUM-VITAMIN D) 500-200 MG-UNIT per tablet Take 1 tablet by mouth daily.    . cholecalciferol (VITAMIN D) 1000 UNITS tablet Take 1,000 Units by mouth daily.  . clonazePAM (KLONOPIN) 0.5 MG tablet TAKE 1-2 TABLETS BY MOUTH ONCE A DAY AS NEEDED  . cyanocobalamin (,VITAMIN B-12,) 1000 MCG/ML injection Inject 1,000 mcg into the muscle every 30 (thirty) days.    . famotidine (PEPCID) 40 MG tablet Take 1 tablet (40 mg total) by mouth 2 (two) times daily as needed for heartburn.  . fluocinolone (VANOS) 0.01 % cream Apply 1 application topically 2 (two) times daily as needed (itching).   . furosemide (LASIX) 20 MG tablet Take 20 mg by mouth daily as needed for fluid or edema. For severe  swelling with discomfort.  . hydrOXYzine (ATARAX/VISTARIL) 10 MG tablet Take 10 mg by mouth 3 (  three) times daily as needed for itching.  . isosorbide mononitrate (IMDUR) 30 MG 24 hr tablet TAKE 1/2 TABLET BY MOUTH DAILY  . loratadine (CLARITIN) 10 MG tablet Take 10 mg by mouth 2 (two) times daily as needed.   . mometasone (NASONEX) 50 MCG/ACT nasal spray Place 2 sprays into the nose daily as needed (allergies).   . nitroGLYCERIN (NITROSTAT) 0.4 MG SL tablet Place 0.4 mg under the tongue every 5 (five) minutes as needed.    . pantoprazole (PROTONIX) 40 MG tablet Take 1 tablet (40 mg total) by mouth daily. Use 30 minutes before your first meal of the day (Patient taking differently: Take 40 mg by mouth 2 (two) times daily. )  . propranolol (INDERAL) 10 MG tablet Take 10 mg by mouth 3 (three) times daily as needed (tremors).  . rosuvastatin (CRESTOR) 20 MG tablet Take 0.5 tablets (10 mg total) by mouth every evening.  . sertraline (ZOLOFT) 25 MG tablet TAKE 1 TABLET (25 MG TOTAL) BY MOUTH EVERY MORNING.  . SYNTHROID 100 MCG tablet   . [DISCONTINUED] SYNTHROID 112 MCG tablet Take 1 tablet (112 mcg total) by mouth daily before breakfast.  :  Review of Systems:  Out of a complete 14 point review of systems, all are reviewed and negative with the exception of these symptoms as listed below:  Review of Systems  Constitutional: Positive for fatigue.  HENT: Positive for hearing loss.   Eyes:       Loss of vision  Cardiovascular: Positive for leg swelling.  Gastrointestinal: Positive for abdominal pain and diarrhea.  Genitourinary:       Frequency of urination,urgency  Musculoskeletal: Positive for gait problem.  Skin: Positive for wound.  Neurological: Positive for tremors.       Insomnia, freezing of feet has worsened    Objective:  Neurologic Exam  Physical Exam Physical Examination:   Filed Vitals:   12/11/14 1438  BP: 128/72  Pulse: 70  Temp: 97 F (36.1 C)   General  Examination: The patient is a very pleasant 74 y.o. female in no acute distress. She appears well-developed and well-nourished and well groomed. She is in good spirits today. She is situated in her wheelchair.  HEENT: Normocephalic, atraumatic, pupils are equal, round and reactive to light and accommodation. Extraocular tracking shows mild limitation to upper gaze. She has saccadic breakdown of smooth pursuit but no nystagmus. Hearing is mildly impaired. She has mild facial masking and normal facial sensation. Speech shows mild to moderate hypophonia with minimal dysarthria and mild voice tremor. Neck shows mild to moderate rigidity.  There is a mild head, no-no type tremor. There are no carotid bruits on auscultation. Head is tilted to the L and she tends to lean a little bit to the left in her wheelchair. Oropharynx exam reveals: mild to moderate mouth dryness, adequate dental hygiene and mild airway crowding, due to narrow airway. Mallampati is class II.   Chest: Clear to auscultation without wheezing, rhonchi or crackles noted.  Heart: S1+S2+0, regular and normal without murmurs, rubs or gallops noted.   Abdomen: Soft, non-tender and non-distended with normal bowel sounds appreciated on auscultation.  Extremities: There is 1 to 2+ pitting edema in the distal lower extremities bilaterally.   Skin: Warm and dry without trophic changes noted. She has some smaller bruises on her hands.   Musculoskeletal: exam reveals no obvious joint deformities, tenderness or joint swelling or erythema.   Neurologically:  Mental status: The patient is awake, alert  and oriented in all 4 spheres. Her memory, attention, language and knowledge are fairly well preserved. There is no aphasia, agnosia, apraxia or anomia. Speech is mildly tremulous and slightly dysarthric with normal prosody and enunciation. Thought process is linear. Mood is congruent and affect is normal.  Cranial nerves are as described above under  HEENT exam. In addition, shoulder shrug is normal with equal shoulder height noted. Motor exam: she has thin bulk, 4/5 global strength and tone is increased throughout, moderately, R>L. There is no drift, no resting tremor or rebound. She has a mild action tremor bilaterally as well as a mild postural tremor. Reflexes are 2+ in the UEs and 3+ in the LEs. Fine motor skills are moderately impaired in the UEs and severely impaired in the LEs, with no significant lateralization, perhaps a little worse on the R, unchanged.  Cerebellar testing shows no dysmetria or intention tremor on finger to nose testing. There is no truncal or gait ataxia.  Sensory exam is intact to light touch, pinprick, and temperature sense in both upper and lower extremities as well as intact vibration sense.  Gait, station and balance: I did not have her stand or walk for me today as she did not bring her walker and no walking belt.   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 She is overall progressing slowly. 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 They understand that there is no specific medication and that the course is invariably progressive. She is barely able to use her walker even with assistance at this time. She has fallen repeatedly in the past but not recently thankfully. She is primarily confined to her wheelchair and sleeps in her lift chair. She is in outpatient physical therapy and this has helped her. She also does stretches at home.for sleep I suggested that she try melatonin at night, 3-5 mg, 1-2 hours before her scheduled bedtime. She is taking clonazepam 0.5 mg during the day which helps tone down her tremors. She may also be able to take a pill at night but for now we will try  melatonin.  I will see her back in about 4 months, sooner if needed. I answered all her questions today and she and her husband were in agreement.  She will also receive her B12 shot today. This was given to her by Liane Comber, RN before patient left today.

## 2014-12-12 ENCOUNTER — Ambulatory Visit (INDEPENDENT_AMBULATORY_CARE_PROVIDER_SITE_OTHER)
Admission: RE | Admit: 2014-12-12 | Discharge: 2014-12-12 | Disposition: A | Discharge status: Home / Self Care | Payer: Medicare Other | Source: Ambulatory Visit | Attending: Family Medicine | Admitting: Family Medicine

## 2014-12-12 DIAGNOSIS — M810 Age-related osteoporosis without current pathological fracture: Secondary | ICD-10-CM | POA: Diagnosis not present

## 2014-12-13 ENCOUNTER — Ambulatory Visit: Payer: Medicare Other | Attending: Neurology | Admitting: Physical Therapy

## 2014-12-13 DIAGNOSIS — R29898 Other symptoms and signs involving the musculoskeletal system: Secondary | ICD-10-CM | POA: Diagnosis not present

## 2014-12-13 DIAGNOSIS — E785 Hyperlipidemia, unspecified: Secondary | ICD-10-CM | POA: Insufficient documentation

## 2014-12-13 DIAGNOSIS — I2510 Atherosclerotic heart disease of native coronary artery without angina pectoris: Secondary | ICD-10-CM | POA: Diagnosis not present

## 2014-12-13 DIAGNOSIS — K219 Gastro-esophageal reflux disease without esophagitis: Secondary | ICD-10-CM | POA: Insufficient documentation

## 2014-12-13 DIAGNOSIS — R269 Unspecified abnormalities of gait and mobility: Secondary | ICD-10-CM | POA: Diagnosis not present

## 2014-12-13 DIAGNOSIS — R278 Other lack of coordination: Secondary | ICD-10-CM | POA: Diagnosis not present

## 2014-12-14 ENCOUNTER — Encounter: Payer: Self-pay | Admitting: Physical Therapy

## 2014-12-14 ENCOUNTER — Encounter: Payer: Self-pay | Admitting: Gastroenterology

## 2014-12-14 ENCOUNTER — Ambulatory Visit (INDEPENDENT_AMBULATORY_CARE_PROVIDER_SITE_OTHER): Payer: Medicare Other | Admitting: Gastroenterology

## 2014-12-14 VITALS — BP 140/80 | HR 76

## 2014-12-14 DIAGNOSIS — K219 Gastro-esophageal reflux disease without esophagitis: Secondary | ICD-10-CM | POA: Diagnosis not present

## 2014-12-14 DIAGNOSIS — K5900 Constipation, unspecified: Secondary | ICD-10-CM | POA: Diagnosis not present

## 2014-12-14 MED ORDER — HYOSCYAMINE SULFATE 0.125 MG SL SUBL: 0.2500 mg | SUBLINGUAL_TABLET | SUBLINGUAL | Status: DC | PRN

## 2014-12-14 NOTE — Assessment & Plan Note (Signed)
Victims are fairly well-controlled on a regimen of Pepcid and Protonix.  I will add some simethicone for her excess belching.  Abdominal pain is very nonspecific.  We'll try hyomax.  Should she continue had to have problems with pain or reflux I would consider upper endoscopy.

## 2014-12-14 NOTE — Assessment & Plan Note (Signed)
Plan to continue MiraLAX as needed

## 2014-12-14 NOTE — Progress Notes (Signed)
      History of Present Illness:  Ms. Schwake has returned for follow-up of dyspepsia.  Tensor improved.  Her main complaint is excess belching.  She denies dysphagia.  Barium swallow last year did not reveal any abnormalities.  She also has occasional moderate left upper quadrant discomfort.  She takes MiraLAX as needed for chronic constipation.  She has occasional episodes of diarrhea.    Review of Systems: Pertinent positive and negative review of systems were noted in the above HPI section. All other review of systems were otherwise negative.    Current Medications, Allergies, Past Medical History, Past Surgical History, Family History and Social History were reviewed in Christine record  Vital signs were reviewed in today's medical record. Physical Exam: General: Well developed , well nourished, no acute distress   See Assessment and Plan under Problem List

## 2014-12-14 NOTE — Patient Instructions (Signed)
Follow up as needed

## 2014-12-14 NOTE — Therapy (Signed)
[facility] [address] [place], [place], [address] Phone: [phone]   Fax:  [phone]  Physical Therapy Treatment  Patient Details  Name: [name] [name] MRN: [id number] Date of Birth: [birth year] Referring Provider:  Ria Bush, MD  Encounter Date: 12/13/2014      PT End of Session - 12/14/14 1217    Visit Number 13   Number of Visits 17   Date for PT Re-Evaluation 12/14/14   PT Start Time 2831   PT Stop Time 1536   PT Time Calculation (min) 51 min   Equipment Utilized During Treatment Gait belt      Past Medical History  Diagnosis Date  . GERD (gastroesophageal reflux disease)     s/p nissen  . Familial tremor     followed by Dr. Erling Cruz  . Unspecified chronic bronchitis   . Irritable bladder   . History of pneumonia   . Hypothyroidism   . Depression   . Asthma   . CAD (coronary artery disease)     myoview 5/08:  EF 76%, no scar, no ischemia, +ECG changes with exercise;  cath 04/08/07:  pLAD 30%, mLAD 60-70% - med tx.  Marland Kitchen HLD (hyperlipidemia)   . Multiple system atrophy     [name]  . VAGINITIS, ATROPHIC 11/07/2007  . Osteoporosis 2012    DEXA T-2.7 hip, -3.6 spine    Past Surgical History  Procedure Laterality Date  . Laparoscopic nissen fundoplication    . Cystectomy    . Tumor removal    . Tubal ligation    . Colonoscopy N/A 08/29/2013    int hemm; Inda Castle, MD    There were no vitals taken for this visit.  Visit Diagnosis:  Abnormality of gait      Subjective Assessment - 12/14/14 1214    Symptoms Pt. states she has appt. with GI MD on Fri. to determine cause of stomach/GI problems   Currently in Pain? Yes   Pain Score 4    Pain Location Leg   Pain Orientation Right   Pain Descriptors / Indicators Throbbing   Pain Type Chronic pain   Pain Onset More than a month ago   Multiple Pain Sites No                    [facility] Adult PT  Treatment/Exercise - 12/14/14 0001    Transfers   Transfers Sit to Stand;Stand to Sit   Sit to Stand 4: Min guard;4: Min assist   Stand to Sit 4: Min guard   Ambulation/Gait   Ambulation/Gait Yes   Ambulation/Gait Assistance 4: Min guard   Ambulation Distance (Feet) 360 Feet   Gait Pattern Step-to pattern   Ambulation Surface Level     Practiced transfer training wheelchair to/from mat x 3 reps with mod to min assist;  BIG exercises seated - reaching Down and up x 10 reps; seated trunk rotation - clapping extended arms/hands to right/left sides x 10 reps Seated trunk rotation stretch x 30 sec hold sit to stand x 10 reps with CGA to min assist from mat table            PT Short Term Goals - 11/27/14 1458    PT SHORT TERM GOAL #1   Title Pt. will increase standing tolerance so she is able to report standing 5" at home with S for independence with ADL's.   Baseline met 11-19-14   Time 4  Period Weeks   Status Achieved   PT SHORT TERM GOAL #2   Title Increase distance in 3" walk test by at least 50' for increased amb. speed and endurance.   Time 4   Period Weeks   Status Not Met   PT SHORT TERM GOAL #3   Title Transfer sit to stand from wheelchair with CGA   Baseline met 11-19-14   Time 4   Period Weeks   Status Achieved   PT SHORT TERM GOAL #4   Title Independent in HEP    Baseline 11-15-14   Time 4   Period Weeks   Status Achieved           PT Long Term Goals - 12/14/14 1219    PT LONG TERM GOAL #1   Title Pt. will ambulate 360' nonstop with RW to demo incr. endurance with CGA   Baseline met 12-13-14   Status Achieved   PT LONG TERM GOAL #3   Title Report at least 25% less pain in RLE with weight-bearing   Baseline not met as of 12-13-14   Status Not Met   PT LONG TERM GOAL #4   Title Perform at least 10" on Nustep to demo improved activity tolerance/endurance   Baseline met 12-13-14   Time 8               Plan - 12/14/14 1218    Clinical  Impression Statement Pt. continues to have some increased freezing occurrences with R foot in gait   Pt will benefit from skilled therapeutic intervention in order to improve on the following deficits Abnormal gait;Decreased coordination;Impaired flexibility;Difficulty walking;Decreased balance;Increased muscle spasms;Impaired tone;Decreased mobility;Decreased strength   PT Frequency 2x / week   PT Duration 8 weeks   PT Treatment/Interventions ADLs/Self Care Home Management;Therapeutic activities;Patient/family education;Therapeutic exercise;Gait training;Balance training;Stair training;Neuromuscular re-education;Functional mobility training   PT Next Visit Plan check LTG's next week and renew   PT Home Exercise Plan as instructed        Problem List Patient Active Problem List   Diagnosis Date Noted  . Constipation 12/14/2014  . Medicare annual wellness visit, subsequent 11/27/2014  . Advanced care planning/counseling discussion 11/27/2014  . Osteoporosis   . B-complex deficiency 06/07/2014  . Recurrent falls 02/06/2014  . Multiple system atrophy   . Dysphagia, unspecified(787.20) 07/28/2013  . Lower extremity edema 09/26/2012  . Shoulder pain, left 07/20/2012  . 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 Routine health maintenance 08/26/2011  . Murmur 05/27/2011  . HYPERCHOLESTEROLEMIA  IIA 05/14/2010  . VENOUS INSUFFICIENCY, LEGS 06/21/2009  . CORONARY HEART DISEASE 06/09/2008  . Hypothyroidism 05/15/2008  . ASTHMA 05/15/2008  . Depression 11/07/2007  . 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 GERD 11/07/2007  . VAGINITIS, ATROPHIC 11/07/2007    Alda Lea, PT 12/14/2014, 12:21 PM  [facility] [address] [place], [place], [address] Phone: [phone]   Fax:  [phone]

## 2014-12-15 ENCOUNTER — Other Ambulatory Visit: Payer: Self-pay | Admitting: Cardiovascular Disease

## 2014-12-16 NOTE — Telephone Encounter (Addendum)
Finished and in Kim's box. plz schedule for after DEXA scan. (looks like it was done this week)

## 2014-12-17 ENCOUNTER — Encounter: Payer: Self-pay | Admitting: Physical Therapy

## 2014-12-17 ENCOUNTER — Ambulatory Visit: Payer: Medicare Other | Admitting: Physical Therapy

## 2014-12-17 DIAGNOSIS — I2510 Atherosclerotic heart disease of native coronary artery without angina pectoris: Secondary | ICD-10-CM | POA: Diagnosis not present

## 2014-12-17 DIAGNOSIS — R278 Other lack of coordination: Secondary | ICD-10-CM | POA: Diagnosis not present

## 2014-12-17 DIAGNOSIS — K219 Gastro-esophageal reflux disease without esophagitis: Secondary | ICD-10-CM | POA: Diagnosis not present

## 2014-12-17 DIAGNOSIS — R269 Unspecified abnormalities of gait and mobility: Secondary | ICD-10-CM | POA: Diagnosis not present

## 2014-12-17 DIAGNOSIS — R29898 Other symptoms and signs involving the musculoskeletal system: Secondary | ICD-10-CM

## 2014-12-17 DIAGNOSIS — E785 Hyperlipidemia, unspecified: Secondary | ICD-10-CM | POA: Diagnosis not present

## 2014-12-17 NOTE — Therapy (Signed)
[facility] [address] [place], [place], [address] Phone: [phone]   Fax:  [phone]  Physical Therapy Treatment  Patient Details  Name: [name] [name] MRN: [id number] Date of Birth: Dec 05, 1940 Referring Provider:  Ria Bush, MD  Encounter Date: 12/17/2014      PT End of Session - 12/17/14 1635    Visit Number 14   Number of Visits 17   Date for PT Re-Evaluation 01/17/15   Authorization Type [employer] MCR   Authorization Time Period 12-17-14 - 02-16-15   PT Start Time 1315   PT Stop Time 1405   PT Time Calculation (min) 50 min   Equipment Utilized During Treatment Gait belt   Activity Tolerance Patient tolerated treatment well   Behavior During Therapy [facility] for tasks assessed/performed      Past Medical History  Diagnosis Date  . GERD (gastroesophageal reflux disease)     s/p nissen  . Familial tremor     followed by Dr. Erling Cruz  . Unspecified chronic bronchitis   . Irritable bladder   . History of pneumonia   . Hypothyroidism   . Depression   . Asthma   . CAD (coronary artery disease)     myoview 5/08:  EF 76%, no scar, no ischemia, +ECG changes with exercise;  cath 04/08/07:  pLAD 30%, mLAD 60-70% - med tx.  Marland Kitchen HLD (hyperlipidemia)   . Multiple system atrophy     [name]  . VAGINITIS, ATROPHIC 11/07/2007  . Osteoporosis 2012    DEXA T-2.7 hip, -3.6 spine    Past Surgical History  Procedure Laterality Date  . Laparoscopic nissen fundoplication    . Cystectomy    . Tumor removal    . Tubal ligation    . Colonoscopy N/A 08/29/2013    int hemm; Inda Castle, MD    There were no vitals taken for this visit.  Visit Diagnosis:  Abnormality of gait - Plan: PT plan of care cert/re-cert  Right leg weakness - Plan: PT plan of care cert/re-cert      Subjective Assessment - 12/17/14 1626    Symptoms Pt. states MD said she may need an endoscopy if stomach/GI issues do not resolve;  pt. reports no falls since last PT session   Currently in Pain? Yes   Pain Score 3    Pain Location Leg   Pain Orientation Right   Pain Descriptors / Indicators Throbbing   Pain Type Chronic pain   Pain Onset More than a month ago   Pain Frequency Intermittent   Multiple Pain Sites No                    [facility] Adult PT Treatment/Exercise - 12/17/14 1630    Transfers   Transfers Sit to Stand;Stand to Sit   Sit to Stand 4: Min guard;4: Min assist   Stand to Sit 4: Min guard   Ambulation/Gait   Ambulation/Gait Yes   Ambulation/Gait Assistance 4: Min guard   Ambulation Distance (Feet) 360 Feet   Assistive device Rolling walker   Gait Pattern Step-to pattern   Ambulation Surface Level   Stairs Yes   Stairs Assistance 3: Mod assist   Stairs Assistance Details (indicate cue type and reason) pt. leans posteriorly when descending steps - needs mod assist to lean forward   Stair Management Technique Step to pattern;Two rails   Number of Stairs 4   Height of Stairs 6  High Level Balance   High Level Balance Activities Negotiating over obstacles  with bil. UE support 10 reps each - stepping over bolster     BIG exercises seated - reaching down and up x 10 reps; trunk rotation 10 reps toward right and left sides - hands meeting together Trunk rotation stretch - with hands down on mat - 30 sec hold each side   TUG:  35.78 secs with RW  sit to stand x 5 reps each without UE support from mat  No charge as unsupervised - Scifit level 1.5 x 15"         PT Short Term Goals - 12/17/14 1638    PT SHORT TERM GOAL #1   Title Pt. will increase standing tolerance so she is able to report standing 5" at home with S for independence with ADL's.   Baseline met 11-19-14   Time 4   Period Weeks   Status Achieved   PT SHORT TERM GOAL #3   Title Transfer sit to stand from wheelchair with CGA   Baseline met 11-19-14   Time 4   Period Weeks   Status Achieved   PT SHORT TERM GOAL  #4   Title Independent in HEP    Status Achieved           PT Long Term Goals - 12/17/14 1639    PT LONG TERM GOAL #1   Title Pt. will ambulate 360' nonstop with RW to demo incr. endurance with CGA   Baseline met 12-13-14   Status Achieved   PT LONG TERM GOAL #2   Title Report ability to stand at least 10" at home for improved standing tolerance   Baseline pt. reports standing approx. 5" - pain intensity 4/10  12-17-14   Status On-going   PT LONG TERM GOAL #3   Title Report at least 25% less pain in RLE with weight-bearing   Baseline not met as of 12-13-14   Status Not Met   PT LONG TERM GOAL #4   Title Perform at least 10" on Nustep to demo improved activity tolerance/endurance   Baseline met 12-13-14   Status Achieved   PT LONG TERM GOAL #5   Title Amb. 480' with RW with CGA on flat even surface with less than 3 freezing episodes.   Baseline 01-16-15   Time 4   Period Weeks   Status New   Additional Long Term Goals   Additional Long Term Goals Yes   PT LONG TERM GOAL #6   Title Improve TUG score to </= 28 secs. with RW with CGA.   Baseline 35.78 secs with RW      01-16-15   Time 4   Period Weeks   Status New               Problem List Patient Active Problem List   Diagnosis Date Noted  . Constipation 12/14/2014  . Medicare annual wellness visit, subsequent 11/27/2014  . Advanced care planning/counseling discussion 11/27/2014  . Osteoporosis   . B-complex deficiency 06/07/2014  . Recurrent falls 02/06/2014  . Multiple system atrophy   . Dysphagia, unspecified(787.20) 07/28/2013  . Lower extremity edema 09/26/2012  . Shoulder pain, left 07/20/2012  . 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 Routine health maintenance 08/26/2011  . Murmur 05/27/2011  . HYPERCHOLESTEROLEMIA  IIA 05/14/2010  . VENOUS INSUFFICIENCY, LEGS 06/21/2009  . CORONARY HEART DISEASE 06/09/2008  . Hypothyroidism 05/15/2008  . ASTHMA 05/15/2008  . Depression 11/07/2007  .  FAMILIAL TREMOR  11/07/2007  . GERD 11/07/2007  . VAGINITIS, ATROPHIC 11/07/2007    Alda Lea, PT 12/17/2014, 4:52 PM  [facility] [address] [place], [place], [address] Phone: [phone]   Fax:  [phone]

## 2014-12-17 NOTE — Telephone Encounter (Signed)
Order faxed to Short Stay. They will schedule appt.

## 2014-12-18 ENCOUNTER — Other Ambulatory Visit: Payer: Self-pay | Admitting: Family Medicine

## 2014-12-20 ENCOUNTER — Ambulatory Visit: Payer: Medicare Other | Admitting: Physical Therapy

## 2014-12-20 DIAGNOSIS — I2510 Atherosclerotic heart disease of native coronary artery without angina pectoris: Secondary | ICD-10-CM | POA: Diagnosis not present

## 2014-12-20 DIAGNOSIS — R29898 Other symptoms and signs involving the musculoskeletal system: Secondary | ICD-10-CM | POA: Diagnosis not present

## 2014-12-20 DIAGNOSIS — R269 Unspecified abnormalities of gait and mobility: Secondary | ICD-10-CM

## 2014-12-20 DIAGNOSIS — K219 Gastro-esophageal reflux disease without esophagitis: Secondary | ICD-10-CM | POA: Diagnosis not present

## 2014-12-20 DIAGNOSIS — E785 Hyperlipidemia, unspecified: Secondary | ICD-10-CM | POA: Diagnosis not present

## 2014-12-20 DIAGNOSIS — R278 Other lack of coordination: Secondary | ICD-10-CM | POA: Diagnosis not present

## 2014-12-21 ENCOUNTER — Encounter: Payer: Self-pay | Admitting: Physical Therapy

## 2014-12-21 NOTE — Therapy (Signed)
[facility] [address] [place], [place], [address] Phone: [phone]   Fax:  [phone]  Physical Therapy Treatment  Patient Details  Name: [name] [name] MRN: [id number] Date of Birth: 04-Oct-1941 Referring Provider:  Ria Bush, MD  Encounter Date: 12/20/2014      PT End of Session - 12/21/14 1002    Visit Number 15  G5   Number of Visits 17   Date for PT Re-Evaluation 01/17/15   Authorization Type [employer] MCR   Authorization Time Period 12-17-14 - 02-16-15   PT Start Time 1319   PT Stop Time 1406   PT Time Calculation (min) 47 min   Equipment Utilized During Treatment Gait belt      Past Medical History  Diagnosis Date  . GERD (gastroesophageal reflux disease)     s/p nissen  . Familial tremor     followed by Dr. Erling Cruz  . Unspecified chronic bronchitis   . Irritable bladder   . History of pneumonia   . Hypothyroidism   . Depression   . Asthma   . CAD (coronary artery disease)     myoview 5/08:  EF 76%, no scar, no ischemia, +ECG changes with exercise;  cath 04/08/07:  pLAD 30%, mLAD 60-70% - med tx.  Marland Kitchen HLD (hyperlipidemia)   . Multiple system atrophy     [name]  . VAGINITIS, ATROPHIC 11/07/2007  . Osteoporosis 2012    DEXA T-2.7 hip, -3.6 spine    Past Surgical History  Procedure Laterality Date  . Laparoscopic nissen fundoplication    . Cystectomy    . Tumor removal    . Tubal ligation    . Colonoscopy N/A 08/29/2013    int hemm; Inda Castle, MD    There were no vitals taken for this visit.  Visit Diagnosis:  Abnormality of gait  Right leg weakness      Subjective Assessment - 12/21/14 1000    Symptoms Pt. states she is still having stomach problems   Currently in Pain? No/denies                    [facility] Adult PT Treatment/Exercise - 12/21/14 0001    Transfers   Transfers Sit to Stand;Stand to Sit   Sit to Stand 4: Min guard;4: Min assist    Stand to Sit 4: Min guard   Ambulation/Gait   Ambulation/Gait Yes   Ambulation/Gait Assistance 4: Min guard   Ambulation Distance (Feet) 475 Feet   Assistive device Rolling walker   Gait Pattern Step-to pattern   Ambulation Surface Level      Neuro Re-ed: BIG exercises seated - trunk rotation with clapping opposite UE x 10 reps; reaching down to floor and up x 10 reps; Leaning down on foream and reaching overhead for trunk stretch x 30 secs hold;  Sit to stand x 10 reps with CGA from mat;  stepping over and back of bolster inside bars with UE support x 10 reps each leg; sidestepping 10' x4 reps and backwards 10' x 2 reps with  CGA inside bars: crossing each foot over other leg for coordination (standing inside bars) with bil. UE support  Scifit level 1.5 x 15" (no charge as unsupervised)            PT Short Term Goals - 12/17/14 1638    PT SHORT TERM GOAL #1   Title Pt. will increase standing tolerance so she is able to  report standing 5" at home with S for independence with ADL's.   Baseline met 11-19-14   Time 4   Period Weeks   Status Achieved   PT SHORT TERM GOAL #3   Title Transfer sit to stand from wheelchair with CGA   Baseline met 11-19-14   Time 4   Period Weeks   Status Achieved   PT SHORT TERM GOAL #4   Title Independent in HEP    Status Achieved           PT Long Term Goals - 12/17/14 1639    PT LONG TERM GOAL #1   Title Pt. will ambulate 360' nonstop with RW to demo incr. endurance with CGA   Baseline met 12-13-14   Status Achieved   PT LONG TERM GOAL #2   Title Report ability to stand at least 10" at home for improved standing tolerance   Baseline pt. reports standing approx. 5" - pain intensity 4/10  12-17-14   Status On-going   PT LONG TERM GOAL #3   Title Report at least 25% less pain in RLE with weight-bearing   Baseline not met as of 12-13-14   Status Not Met   PT LONG TERM GOAL #4   Title Perform at least 10" on Nustep to demo improved  activity tolerance/endurance   Baseline met 12-13-14   Status Achieved   PT LONG TERM GOAL #5   Title Amb. 480' with RW with CGA on flat even surface with less than 3 freezing episodes.   Baseline 01-16-15   Time 4   Period Weeks   Status New   Additional Long Term Goals   Additional Long Term Goals Yes   PT LONG TERM GOAL #6   Title Improve TUG score to </= 28 secs. with RW with CGA.   Baseline 35.78 secs with RW      01-16-15   Time 4   Period Weeks   Status New               Plan - 12/21/14 1003    Clinical Impression Statement R foot having some occurrences of freezing episodes during balance activities today; pt had incr. activity tolerance today as evidenced by ability to amb. 475' prio to rest period   Pt will benefit from skilled therapeutic intervention in order to improve on the following deficits Abnormal gait;Decreased coordination;Impaired flexibility;Difficulty walking;Decreased balance;Increased muscle spasms;Impaired tone;Decreased mobility;Decreased strength   Rehab Potential Good   PT Frequency 2x / week   PT Duration 8 weeks   PT Treatment/Interventions ADLs/Self Care Home Management;Therapeutic activities;Patient/family education;Therapeutic exercise;Gait training;Balance training;Stair training;Neuromuscular re-education;Functional mobility training   PT Next Visit Plan continue balance and gait   PT Home Exercise Plan as instructed   Consulted and Agree with Plan of Care Patient        Problem List Patient Active Problem List   Diagnosis Date Noted  . Constipation 12/14/2014  . Medicare annual wellness visit, subsequent 11/27/2014  . Advanced care planning/counseling discussion 11/27/2014  . Osteoporosis   . B-complex deficiency 06/07/2014  . Recurrent falls 02/06/2014  . Multiple system atrophy   . Dysphagia, unspecified(787.20) 07/28/2013  . Lower extremity edema 09/26/2012  . Shoulder pain, left 07/20/2012  . 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 Routine health maintenance 08/26/2011  . Murmur 05/27/2011  . HYPERCHOLESTEROLEMIA  IIA 05/14/2010  . VENOUS INSUFFICIENCY, LEGS 06/21/2009  . CORONARY HEART DISEASE 06/09/2008  . Hypothyroidism 05/15/2008  . ASTHMA 05/15/2008  . Depression  11/07/2007  . 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 GERD 11/07/2007  . VAGINITIS, ATROPHIC 11/07/2007    Alda Lea, PT 12/21/2014, 10:09 AM  [facility] [address] [place], [place], [address] Phone: [phone]   Fax:  [phone]

## 2014-12-24 ENCOUNTER — Ambulatory Visit: Payer: Medicare Other | Admitting: Physical Therapy

## 2014-12-26 ENCOUNTER — Encounter: Payer: Self-pay | Admitting: Physical Therapy

## 2014-12-26 ENCOUNTER — Ambulatory Visit: Payer: Medicare Other | Admitting: Physical Therapy

## 2014-12-26 DIAGNOSIS — R269 Unspecified abnormalities of gait and mobility: Secondary | ICD-10-CM

## 2014-12-26 DIAGNOSIS — R278 Other lack of coordination: Secondary | ICD-10-CM | POA: Diagnosis not present

## 2014-12-26 DIAGNOSIS — I2510 Atherosclerotic heart disease of native coronary artery without angina pectoris: Secondary | ICD-10-CM | POA: Diagnosis not present

## 2014-12-26 DIAGNOSIS — K219 Gastro-esophageal reflux disease without esophagitis: Secondary | ICD-10-CM | POA: Diagnosis not present

## 2014-12-26 DIAGNOSIS — R29898 Other symptoms and signs involving the musculoskeletal system: Secondary | ICD-10-CM | POA: Diagnosis not present

## 2014-12-26 DIAGNOSIS — E785 Hyperlipidemia, unspecified: Secondary | ICD-10-CM | POA: Diagnosis not present

## 2014-12-26 NOTE — Therapy (Signed)
[facility] [address] [place], [place], [address] Phone: [phone]   Fax:  [phone]  Physical Therapy Treatment  Patient Details  Name: [name] [name] MRN: [id number] Date of Birth: 07-Oct-1941 Referring Provider:  Ria Bush, MD  Encounter Date: 12/26/2014      PT End of Session - 12/26/14 1604    Visit Number 16   Number of Visits 24   Date for PT Re-Evaluation 01/17/15   Authorization Type [employer] MCR   Authorization Time Period 12-17-14 - 02-16-15   PT Start Time 1148   PT Stop Time 1250   PT Time Calculation (min) 62 min   Equipment Utilized During Treatment Gait belt      Past Medical History  Diagnosis Date  . GERD (gastroesophageal reflux disease)     s/p nissen  . Familial tremor     followed by Dr. Erling Cruz  . Unspecified chronic bronchitis   . Irritable bladder   . History of pneumonia   . Hypothyroidism   . Depression   . Asthma   . CAD (coronary artery disease)     myoview 5/08:  EF 76%, no scar, no ischemia, +ECG changes with exercise;  cath 04/08/07:  pLAD 30%, mLAD 60-70% - med tx.  Marland Kitchen HLD (hyperlipidemia)   . Multiple system atrophy     [name]  . VAGINITIS, ATROPHIC 11/07/2007  . Osteoporosis 2012    DEXA T-2.7 hip, -3.6 spine    Past Surgical History  Procedure Laterality Date  . Laparoscopic nissen fundoplication    . Cystectomy    . Tumor removal    . Tubal ligation    . Colonoscopy N/A 08/29/2013    int hemm; Inda Castle, MD    There were no vitals taken for this visit.  Visit Diagnosis:  Abnormality of gait  Right leg weakness      Subjective Assessment - 12/26/14 1602    Symptoms Pt. states R foot has been freezing some at home   Currently in Pain? No/denies                    [facility] Adult PT Treatment/Exercise - 12/26/14 0001    Transfers   Transfers Sit to Stand;Stand to Sit   Sit to Stand 4: Min guard;4: Min assist   Stand  to Sit 4: Min guard   Ambulation/Gait   Ambulation/Gait Yes   Ambulation/Gait Assistance 4: Min guard   Ambulation Distance (Feet) 360 Feet   Assistive device Rolling walker   Gait Pattern Step-to pattern   Ambulation Surface Level      NeuroRe-ed: BIG exercises - reaching down to floor and up x 10 reps each; reaching across for trunk rotation - clapping hands Together with rotation; seated trunk stretch - hands on mat; for lateral trunk stretching  Stepping over and back of bolster x 10 reps each foot; sidestepping and backwards amb. Inside bars 10' 4 reps each; Alternate tap ups to 4" step for feedforward and coordination activity  Scifit level 1.5 x 20" with UE's and LE's          PT Short Term Goals - 12/17/14 1638    PT SHORT TERM GOAL #1   Title Pt. will increase standing tolerance so she is able to report standing 5" at home with S for independence with ADL's.   Baseline met 11-19-14   Time 4   Period Weeks   Status Achieved  PT SHORT TERM GOAL #3   Title Transfer sit to stand from wheelchair with CGA   Baseline met 11-19-14   Time 4   Period Weeks   Status Achieved   PT SHORT TERM GOAL #4   Title Independent in HEP    Status Achieved           PT Long Term Goals - 12/17/14 1639    PT LONG TERM GOAL #1   Title Pt. will ambulate 360' nonstop with RW to demo incr. endurance with CGA   Baseline met 12-13-14   Status Achieved   PT LONG TERM GOAL #2   Title Report ability to stand at least 10" at home for improved standing tolerance   Baseline pt. reports standing approx. 5" - pain intensity 4/10  12-17-14   Status On-going   PT LONG TERM GOAL #3   Title Report at least 25% less pain in RLE with weight-bearing   Baseline not met as of 12-13-14   Status Not Met   PT LONG TERM GOAL #4   Title Perform at least 10" on Nustep to demo improved activity tolerance/endurance   Baseline met 12-13-14   Status Achieved   PT LONG TERM GOAL #5   Title Amb. 480' with RW  with CGA on flat even surface with less than 3 freezing episodes.   Baseline 01-16-15   Time 4   Period Weeks   Status New   Additional Long Term Goals   Additional Long Term Goals Yes   PT LONG TERM GOAL #6   Title Improve TUG score to </= 28 secs. with RW with CGA.   Baseline 35.78 secs with RW      01-16-15   Time 4   Period Weeks   Status New               Plan - 12/26/14 1604    Clinical Impression Statement Pt. having some freezing of R foot during gait and during balance activities, i.e. stepping over and back of 1/2 bolster   Pt will benefit from skilled therapeutic intervention in order to improve on the following deficits Abnormal gait;Decreased coordination;Impaired flexibility;Difficulty walking;Decreased balance;Increased muscle spasms;Impaired tone;Decreased mobility;Decreased strength   Rehab Potential Good   PT Frequency 2x / week   PT Duration 4 weeks   PT Treatment/Interventions ADLs/Self Care Home Management;Therapeutic activities;Patient/family education;Therapeutic exercise;Gait training;Balance training;Stair training;Neuromuscular re-education;Functional mobility training   PT Next Visit Plan continue balance and gait   PT Home Exercise Plan as instructed   Consulted and Agree with Plan of Care Patient        Problem List Patient Active Problem List   Diagnosis Date Noted  . Constipation 12/14/2014  . Medicare annual wellness visit, subsequent 11/27/2014  . Advanced care planning/counseling discussion 11/27/2014  . Osteoporosis   . B-complex deficiency 06/07/2014  . Recurrent falls 02/06/2014  . Multiple system atrophy   . Dysphagia, unspecified(787.20) 07/28/2013  . Lower extremity edema 09/26/2012  . Shoulder pain, left 07/20/2012  . 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 Routine health maintenance 08/26/2011  . Murmur 05/27/2011  . HYPERCHOLESTEROLEMIA  IIA 05/14/2010  . VENOUS INSUFFICIENCY, LEGS 06/21/2009  . CORONARY HEART DISEASE  06/09/2008  . Hypothyroidism 05/15/2008  . ASTHMA 05/15/2008  . Depression 11/07/2007  . 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 GERD 11/07/2007  . VAGINITIS, ATROPHIC 11/07/2007    Alda Lea, PT 12/26/2014, 4:10 PM  [facility] [address] [place],  [place], [address] Phone: [phone]   Fax:  [phone]

## 2014-12-27 ENCOUNTER — Telehealth: Payer: Self-pay

## 2014-12-27 ENCOUNTER — Ambulatory Visit: Payer: Medicare Other | Admitting: Physical Therapy

## 2014-12-27 NOTE — Telephone Encounter (Signed)
Pt left v/m requesting cb with bone density results; left v/m requesting pt to cb to office.

## 2014-12-28 ENCOUNTER — Ambulatory Visit: Payer: Medicare Other | Admitting: Gastroenterology

## 2014-12-28 NOTE — Telephone Encounter (Signed)
Patient notified and will await call to schedule Boniva.

## 2014-12-28 NOTE — Telephone Encounter (Signed)
Pt left v/m requesting cb about bone density test and pt wants to know if she is going to have to take injections.

## 2014-12-31 ENCOUNTER — Other Ambulatory Visit: Payer: Self-pay

## 2014-12-31 ENCOUNTER — Telehealth: Payer: Self-pay | Admitting: Gastroenterology

## 2014-12-31 NOTE — Telephone Encounter (Signed)
She reports she has not vomited or had diarrhea since she saw you 12/15/14. Continues to have burping, heartburn and gas unrelated to diet. The left sided pain is off and on. Levsin helpful. She calls to give an update and ask what is next.

## 2015-01-01 ENCOUNTER — Other Ambulatory Visit: Payer: Self-pay | Admitting: Gastroenterology

## 2015-01-01 ENCOUNTER — Other Ambulatory Visit: Payer: Self-pay

## 2015-01-01 DIAGNOSIS — R12 Heartburn: Secondary | ICD-10-CM

## 2015-01-01 NOTE — Telephone Encounter (Signed)
Please schedule upper endoscopy

## 2015-01-01 NOTE — Telephone Encounter (Signed)
Agrees to this plan. EGD at [facility] 01/09/15. Arrive at 7:45 am.

## 2015-01-09 ENCOUNTER — Encounter (HOSPITAL_COMMUNITY)
Admission: RE | Disposition: A | Discharge status: Home / Self Care | Payer: Self-pay | Source: Ambulatory Visit | Attending: Gastroenterology

## 2015-01-09 ENCOUNTER — Ambulatory Visit (HOSPITAL_COMMUNITY)
Admission: RE | Admit: 2015-01-09 | Discharge: 2015-01-09 | Disposition: A | Discharge status: Home / Self Care | Payer: Medicare Other | Source: Ambulatory Visit | Attending: Gastroenterology | Admitting: Gastroenterology

## 2015-01-09 ENCOUNTER — Encounter (HOSPITAL_COMMUNITY): Payer: Self-pay | Admitting: *Deleted

## 2015-01-09 DIAGNOSIS — R12 Heartburn: Secondary | ICD-10-CM

## 2015-01-09 DIAGNOSIS — R1013 Epigastric pain: Secondary | ICD-10-CM | POA: Diagnosis not present

## 2015-01-09 DIAGNOSIS — K5900 Constipation, unspecified: Secondary | ICD-10-CM | POA: Insufficient documentation

## 2015-01-09 DIAGNOSIS — R1010 Upper abdominal pain, unspecified: Secondary | ICD-10-CM | POA: Diagnosis not present

## 2015-01-09 DIAGNOSIS — R112 Nausea with vomiting, unspecified: Secondary | ICD-10-CM

## 2015-01-09 DIAGNOSIS — Z79899 Other long term (current) drug therapy: Secondary | ICD-10-CM | POA: Insufficient documentation

## 2015-01-09 HISTORY — PX: ESOPHAGOGASTRODUODENOSCOPY: SHX5428

## 2015-01-09 SURGERY — EGD (ESOPHAGOGASTRODUODENOSCOPY)
Anesthesia: Moderate Sedation

## 2015-01-09 MED ORDER — MIDAZOLAM HCL 10 MG/2ML IJ SOLN
INTRAMUSCULAR | Status: AC
  Filled 2015-01-09: qty 2

## 2015-01-09 MED ORDER — MIDAZOLAM HCL 10 MG/2ML IJ SOLN
INTRAMUSCULAR | Status: DC | PRN
  Administered 2015-01-09: 2 mg via INTRAVENOUS
  Administered 2015-01-09: 1 mg via INTRAVENOUS

## 2015-01-09 MED ORDER — SODIUM CHLORIDE 0.9 % IV SOLN
INTRAVENOUS | Status: DC
  Administered 2015-01-09: 500 mL via INTRAVENOUS

## 2015-01-09 MED ORDER — FENTANYL CITRATE 0.05 MG/ML IJ SOLN
INTRAMUSCULAR | Status: AC
  Filled 2015-01-09: qty 2

## 2015-01-09 MED ORDER — FENTANYL CITRATE 0.05 MG/ML IJ SOLN
INTRAMUSCULAR | Status: DC | PRN
  Administered 2015-01-09 (×2): 25 ug via INTRAVENOUS

## 2015-01-09 MED ORDER — BUTAMBEN-TETRACAINE-BENZOCAINE 2-2-14 % EX AERO
INHALATION_SPRAY | CUTANEOUS | Status: DC | PRN
  Administered 2015-01-09: 2 via TOPICAL

## 2015-01-09 NOTE — Op Note (Addendum)
[facility] [place], [address]   ENDOSCOPY PROCEDURE REPORT  PATIENT: [name], [name]  MR#: [id number] BIRTHDATE: 12-29-1940 , 73  yrs. old GENDER: female ENDOSCOPIST: Inda Castle, MD REFERRED BY:  Neena Rhymes, M.D. PROCEDURE DATE:  01/09/2015 PROCEDURE:  EGD, diagnostic ASA CLASS:     Class II INDICATIONS:  nausea, vomiting, and dyspepsia. MEDICATIONS: Monitored anesthesia care TOPICAL ANESTHETIC:  DESCRIPTION OF PROCEDURE: After the risks benefits and alternatives of the procedure were thoroughly explained, informed consent was obtained.  The Pentax Gastroscope [id number] endoscope was introduced through the mouth and advanced to the third portion of the duodenum , Without limitations.  The instrument was slowly withdrawn as the mucosa was fully examined.      EXAM: The esophagus and gastroesophageal junction were completely normal in appearance.  The stomach was entered and closely examined.The antrum, angularis, and lesser curvature were well visualized, including a retroflexed view of the cardia and fundus. The stomach wall was normally distensable.  The scope passed easily through the pylorus into the duodenum.  Retroflexed views revealed no abnormalities.     The scope was then withdrawn from the patient and the procedure completed.  COMPLICATIONS: There were no immediate complications.  ENDOSCOPIC IMPRESSION: Normal appearing esophagus and GE junction, the stomach was well visualized and normal in appearance, normal appearing duodenum  RECOMMENDATIONS: My office will arrange for you to have a gastric emptying scan performed.  REPEAT EXAM:  eSigned:  Inda Castle, MD 01/09/2015 10:16 AM Revised: 01/09/2015 10:16 AM   CC:

## 2015-01-09 NOTE — H&P (View-Only) (Signed)
      History of Present Illness:  [name] [name] has returned for follow-up of dyspepsia.  Tensor improved.  Her main complaint is excess belching.  She denies dysphagia.  Barium swallow last year did not reveal any abnormalities.  She also has occasional moderate left upper quadrant discomfort.  She takes MiraLAX as needed for chronic constipation.  She has occasional episodes of diarrhea.    Review of Systems: Pertinent positive and negative review of systems were noted in the above HPI section. All other review of systems were otherwise negative.    Current Medications, Allergies, Past Medical History, Past Surgical History, Family History and Social History were reviewed in [facility] record  Vital signs were reviewed in today's medical record. Physical Exam: General: Well developed , well nourished, no acute distress   See Assessment and Plan under Problem List

## 2015-01-09 NOTE — Interval H&P Note (Signed)
History and Physical Interval Note:  01/09/2015 9:21 AM  [name] [name]  has presented today for surgery, with the diagnosis of heartburn  The various methods of treatment have been discussed with the patient and family. After consideration of risks, benefits and other options for treatment, the patient has consented to  Procedure(s) with comments: ESOPHAGOGASTRODUODENOSCOPY (EGD) (N/A) - help with transfers as a surgical intervention .  The patient's history has been reviewed, patient examined, no change in status, stable for surgery.  I have reviewed the patient's chart and labs.  Questions were answered to the patient's satisfaction.   Pt continues to have dyspepsia.    Erskine Emery

## 2015-01-10 ENCOUNTER — Encounter (HOSPITAL_COMMUNITY): Payer: Self-pay | Admitting: Gastroenterology

## 2015-01-11 ENCOUNTER — Ambulatory Visit (INDEPENDENT_AMBULATORY_CARE_PROVIDER_SITE_OTHER): Payer: Medicare Other | Admitting: Cardiovascular Disease

## 2015-01-11 ENCOUNTER — Encounter: Payer: Self-pay | Admitting: Cardiovascular Disease

## 2015-01-11 VITALS — BP 120/72 | HR 57 | Ht 62.0 in | Wt 150.0 lb

## 2015-01-11 DIAGNOSIS — I2510 Atherosclerotic heart disease of native coronary artery without angina pectoris: Secondary | ICD-10-CM

## 2015-01-11 NOTE — Progress Notes (Signed)
Patient ID: [name] [name], female   DOB: [birth year], 74 y.o.   MRN: [id number]    Cardiology Office Note   Date:  01/11/2015   ID:  [name] [name], DOB 1941/10/13, MRN [id number]  PCP:  Ria Bush, MD  Cardiologist:  [name], MD    Chief Complaint  Patient presents with  . Coronary Artery Disease     History of Present Illness: [name] [name] is a 74 y.o. female who presents for a follow up evaluation. She has a history of coronary artery disease and has been managed medically. 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   Last lipid panel on 11/20/14 revealed Chol 130, Trigly 115, HDL 44, LDL 63. She is currently on Crestor 10 mg daily.   Today, patient reports she is doing well. She denies any dyspnea, chest pain/pressure, orthopnea. She does note some leg swelling which usually resolves after raising her legs.    Past Medical History  Diagnosis Date  . GERD (gastroesophageal reflux disease)     s/p nissen  . Familial tremor     followed by Dr. Erling Cruz  . Unspecified chronic bronchitis   . Irritable bladder   . History of pneumonia   . Hypothyroidism   . Depression   . Asthma   . CAD (coronary artery disease)     myoview 5/08:  EF 76%, no scar, no ischemia, +ECG changes with exercise;  cath 04/08/07:  pLAD 30%, mLAD 60-70% - med tx.  Marland Kitchen HLD (hyperlipidemia)   . Multiple system atrophy     [name]  . VAGINITIS, ATROPHIC 11/07/2007  . Osteoporosis 2016    DEXA T-4.4 spine deteriorated since 2012    Past Surgical History  Procedure Laterality Date  . Laparoscopic nissen fundoplication    . Cystectomy    . Tumor removal    . Tubal ligation    . Colonoscopy N/A 08/29/2013    int hemm; Inda Castle, MD  . Dexa  12/2014    T -4.4 spine  . Esophagogastroduodenoscopy N/A 01/09/2015    Procedure: ESOPHAGOGASTRODUODENOSCOPY (EGD);  Surgeon: Inda Castle, MD;  Location: [facility]  END[facility];  Service: Endoscopy;  Laterality: N/A;  help with transfers    Current Outpatient Prescriptions  Medication Sig Dispense Refill  . albuterol (PROAIR HFA) 108 (90 BASE) MCG/ACT inhaler Inhale 2 puffs into the lungs every 6 (six) hours as needed for wheezing or shortness of breath.     Marland Kitchen alum & mag hydroxide-simeth (MYLANTA) 440-347-42 MG/5ML suspension Take 15 mLs by mouth every 6 (six) hours as needed for indigestion or heartburn.    Marland Kitchen aspirin (ASPIRIN EC) 81 MG EC tablet Take 81 mg by mouth 2 (two) times a week. Swallow whole.    . beclomethasone (QVAR) 80 MCG/ACT inhaler Inhale 1 puff into the lungs daily as needed (wheezing).     . Calcium Carbonate-Vitamin D (CALCIUM-VITAMIN D) 500-200 MG-UNIT per tablet Take 1 tablet by mouth daily.      . cholecalciferol (VITAMIN D) 1000 UNITS tablet Take 1,000 Units by mouth daily.    . clonazePAM (KLONOPIN) 0.5 MG tablet TAKE 1-2 TABLETS BY MOUTH ONCE A DAY AS NEEDED 60 tablet 3  . CRESTOR 20 MG tablet TAKE ONE-HALF TABLET BY MOUTH ONCE EVERY EVENING 45 tablet 0  . cyanocobalamin (,VITAMIN B-12,) 1000 MCG/ML injection Inject 1,000 mcg into the muscle every 30 (thirty) days.      Marland Kitchen  famotidine (PEPCID) 40 MG tablet Take 1 tablet (40 mg total) by mouth 2 (two) times daily as needed for heartburn. 180 tablet 1  . fluocinolone (VANOS) 0.01 % cream Apply 1 application topically 2 (two) times daily as needed (itching).     . furosemide (LASIX) 20 MG tablet Take 20 mg by mouth daily as needed for fluid or edema. For severe swelling with discomfort.    . hydrOXYzine (ATARAX/VISTARIL) 10 MG tablet Take 10 mg by mouth 3 (three) times daily as needed for itching.    . hyoscyamine (LEVSIN SL) 0.125 MG SL tablet PLACE 2 TABLETS (0.25 MG TOTAL) UNDER THE TONGUE EVERY 4 (FOUR) HOURS AS NEEDED. 30 tablet 0  . isosorbide mononitrate (IMDUR) 30 MG 24 hr tablet TAKE 1/2 TABLET BY MOUTH DAILY 30 tablet 5  . levothyroxine (SYNTHROID, LEVOTHROID) 112 MCG tablet Take  112 mcg by mouth daily before breakfast.    . loratadine (CLARITIN) 10 MG tablet Take 10 mg by mouth 2 (two) times daily as needed.     . mometasone (NASONEX) 50 MCG/ACT nasal spray Place 2 sprays into the nose daily as needed (allergies).     . nitroGLYCERIN (NITROSTAT) 0.4 MG SL tablet Place 0.4 mg under the tongue every 5 (five) minutes as needed.      . pantoprazole (PROTONIX) 40 MG tablet Take 40 mg by mouth 2 (two) times daily before a meal.     . propranolol (INDERAL) 10 MG tablet Take 1 tablet (10 mg total) by mouth 3 (three) times daily as needed (Tremors). 180 tablet 1  . sertraline (ZOLOFT) 25 MG tablet TAKE 1 TABLET (25 MG TOTAL) BY MOUTH EVERY MORNING. 30 tablet 11  . AFLURIA PRESERVATIVE FREE injection      No current facility-administered medications for this visit.    Allergies:   Review of patient's allergies indicates no known allergies.   Social History:  The patient  reports that she has never smoked. She has never used smokeless tobacco. She reports that she does not drink alcohol or use illicit drugs.   Family History:  The patient's family history includes Breast cancer in an other family member; Coronary artery disease in an other family member; Diabetes in an other family member; Heart attack in an other family member; Heart disease in her father; Hypertension in her mother and another family member.    ROS:  Please see the history of present illness.  Otherwise, review of systems is positive for leg swelling, abdominal pain, constipation.  All other systems are reviewed and negative.    PHYSICAL EXAM: VS:  BP 120/72 mmHg  Pulse 57  Ht 5' 2"  (1.575 m)  Wt 150 lb (68.04 kg)  BMI 27.43 kg/m2  SpO2 97% , BMI Body mass index is 27.43 kg/(m^2). GEN: Well nourished, well developed, in no acute distress HEENT: normal Neck: no JVD, no masses, no carotid bruits Cardiac: RRR without murmur or gallop                Respiratory:  clear to auscultation bilaterally, normal  work of breathing GI: soft, nontender, nondistended, + BS Ext: 1+ pitting edema bilaterally Skin: warm and dry, no rash Neuro: resting tremor present in right upper extremity Psych: euthymic mood, full affect  EKG:  EKG is not ordered today.  Recent Labs: 06/07/2014: Hemoglobin 13.8; Platelets 262.0 11/20/2014: ALT 18; BUN 18; Creatinine 0.8; Potassium 3.9; Sodium 136; TSH 8.76*   Lipid Panel     Component Value Date/Time  CHOL 130 11/20/2014 1301   TRIG 115.0 11/20/2014 1301   HDL 43.90 11/20/2014 1301   CHOLHDL 3 11/20/2014 1301   VLDL 23.0 11/20/2014 1301   LDLCALC 63 11/20/2014 1301   LDLDIRECT 161.5 06/09/2010 1517      Wt Readings from Last 3 Encounters:  01/11/15 150 lb (68.04 kg)  01/09/15 151 lb (68.493 kg)  12/11/14 151 lb (68.493 kg)     ASSESSMENT AND PLAN: 1.  Coronary Artery Disease: Last cardiac cath data reviewed. The patient had moderate CAD. She has no symptoms of angina. She should continue on her current medical program. I will see her back in 6 months.   2. Hyperlipidemia: Lipids reviewed as above. Continue Crestor 10 mg daily.   3. Edema. Suspect related to chronic stasis. Patient is essentially wheelchair bound because of multisystem atrophy.   Current medicines are reviewed with the patient today.  The patient does not have concerns regarding medicines.  The following changes have been made:  no change  Labs/ tests ordered today include:  No orders of the defined types were placed in this encounter.    Disposition:   FU in 6 months   Signed, [name], MD  01/11/2015 3:52 PM    [facility] [place], [place], [place]  [address] Phone: [phone]; Fax: [phone]

## 2015-01-11 NOTE — Patient Instructions (Signed)
Your physician recommends that you continue on your current medications as directed. Please refer to the Current Medication list given to you today.  Your physician wants you to follow-up in: 6 month with Dr. Burt Knack. You will receive a reminder letter in the mail two months in advance. If you don't receive a letter, please call our office to schedule the follow-up appointment.

## 2015-01-14 ENCOUNTER — Other Ambulatory Visit: Payer: Self-pay | Admitting: Gastroenterology

## 2015-01-14 ENCOUNTER — Other Ambulatory Visit: Payer: Self-pay | Admitting: Physician Assistant

## 2015-01-15 ENCOUNTER — Telehealth: Payer: Self-pay | Admitting: Family Medicine

## 2015-01-15 ENCOUNTER — Other Ambulatory Visit: Payer: Self-pay

## 2015-01-15 DIAGNOSIS — R1013 Epigastric pain: Secondary | ICD-10-CM

## 2015-01-15 DIAGNOSIS — R112 Nausea with vomiting, unspecified: Secondary | ICD-10-CM

## 2015-01-15 MED ORDER — HYOSCYAMINE SULFATE 0.125 MG SL SUBL: SUBLINGUAL_TABLET | SUBLINGUAL | Status: DC

## 2015-01-15 NOTE — Telephone Encounter (Signed)
Please advise 

## 2015-01-15 NOTE — Telephone Encounter (Addendum)
Order was faxed to short stay on 2/8 (see phone note from 1/19). I don't know where Maudie Mercury put original.  [facility] stay was supposed to contact patient to schedule. Can we reach out to short stay to inquire about scheduling boniva for patient?

## 2015-01-15 NOTE — Telephone Encounter (Signed)
Pt called was checking on her injection for osteoporosis  She was thinking this was to be done at the surgical center Please advise

## 2015-01-16 ENCOUNTER — Telehealth: Payer: Self-pay | Admitting: Gastroenterology

## 2015-01-16 NOTE — Telephone Encounter (Signed)
Patient is calling to report that last night she woke up with left abdominal pain. She is not hurting today. She has had diarrhea today. She did take Imodium for this. She wanted to let Dr. Deatra Ina now about this.

## 2015-01-16 NOTE — Telephone Encounter (Signed)
Re-faxed order to [facility]. Called patient and notified her of same. She is going to call and schedule appt.

## 2015-01-17 NOTE — Telephone Encounter (Signed)
Noted  

## 2015-01-27 ENCOUNTER — Other Ambulatory Visit: Payer: Self-pay | Admitting: Gastroenterology

## 2015-01-31 ENCOUNTER — Other Ambulatory Visit: Payer: Self-pay

## 2015-01-31 ENCOUNTER — Ambulatory Visit: Payer: Medicare Other | Attending: Neurology | Admitting: Physical Therapy

## 2015-01-31 DIAGNOSIS — R269 Unspecified abnormalities of gait and mobility: Secondary | ICD-10-CM | POA: Diagnosis not present

## 2015-01-31 DIAGNOSIS — I2510 Atherosclerotic heart disease of native coronary artery without angina pectoris: Secondary | ICD-10-CM | POA: Insufficient documentation

## 2015-01-31 DIAGNOSIS — K219 Gastro-esophageal reflux disease without esophagitis: Secondary | ICD-10-CM | POA: Insufficient documentation

## 2015-01-31 DIAGNOSIS — R29898 Other symptoms and signs involving the musculoskeletal system: Secondary | ICD-10-CM

## 2015-01-31 DIAGNOSIS — R278 Other lack of coordination: Secondary | ICD-10-CM | POA: Diagnosis not present

## 2015-01-31 DIAGNOSIS — E785 Hyperlipidemia, unspecified: Secondary | ICD-10-CM | POA: Insufficient documentation

## 2015-01-31 MED ORDER — CLONAZEPAM 0.5 MG PO TABS: ORAL_TABLET | ORAL | Status: DC

## 2015-01-31 NOTE — Telephone Encounter (Signed)
plz phone in. 

## 2015-01-31 NOTE — Telephone Encounter (Signed)
Rx called in as directed and patient notified.

## 2015-01-31 NOTE — Telephone Encounter (Signed)
Pt left v/m requesting refill clonazepam to [facility] today. Pt will be out of med for holiday. Pt request cb when refilled. Pt seen 11/27/14.Please advise.

## 2015-02-01 ENCOUNTER — Encounter: Payer: Self-pay | Admitting: Physical Therapy

## 2015-02-01 DIAGNOSIS — R29898 Other symptoms and signs involving the musculoskeletal system: Secondary | ICD-10-CM | POA: Diagnosis not present

## 2015-02-01 DIAGNOSIS — E785 Hyperlipidemia, unspecified: Secondary | ICD-10-CM | POA: Diagnosis not present

## 2015-02-01 DIAGNOSIS — R269 Unspecified abnormalities of gait and mobility: Secondary | ICD-10-CM | POA: Diagnosis not present

## 2015-02-01 DIAGNOSIS — K219 Gastro-esophageal reflux disease without esophagitis: Secondary | ICD-10-CM | POA: Diagnosis not present

## 2015-02-01 DIAGNOSIS — I2510 Atherosclerotic heart disease of native coronary artery without angina pectoris: Secondary | ICD-10-CM | POA: Diagnosis not present

## 2015-02-01 DIAGNOSIS — R278 Other lack of coordination: Secondary | ICD-10-CM | POA: Diagnosis not present

## 2015-02-01 NOTE — Therapy (Signed)
[facility] [address] [place], [place], [address] Phone: [phone]   Fax:  [phone]  Physical Therapy Treatment  Patient Details  Name: [name] [name] MRN: [id number] Date of Birth: May 29, 1941 Referring Provider:  Ria Bush, MD  Encounter Date: 01/31/2015      PT End of Session - 02/01/15 0949    Visit Number 1  16 previous visits;  G1   Number of Visits 9   Date for PT Re-Evaluation 03/03/15   Authorization Type UHC [facility]   Authorization Time Period 01-31-15 - 04-01-15   PT Start Time 1530   PT Stop Time 1620   PT Time Calculation (min) 50 min   Equipment Utilized During Treatment Gait belt      Past Medical History  Diagnosis Date  . GERD (gastroesophageal reflux disease)     s/p nissen  . Familial tremor     followed by Dr. Erling Cruz  . Unspecified chronic bronchitis   . Irritable bladder   . History of pneumonia   . Hypothyroidism   . Depression   . Asthma   . CAD (coronary artery disease)     myoview 5/08:  EF 76%, no scar, no ischemia, +ECG changes with exercise;  cath 04/08/07:  pLAD 30%, mLAD 60-70% - med tx.  Marland Kitchen HLD (hyperlipidemia)   . Multiple system atrophy     [name]  . VAGINITIS, ATROPHIC 11/07/2007  . Osteoporosis 2016    DEXA T-4.4 spine deteriorated since 2012    Past Surgical History  Procedure Laterality Date  . Laparoscopic nissen fundoplication    . Cystectomy    . Tumor removal    . Tubal ligation    . Colonoscopy N/A 08/29/2013    int hemm; Inda Castle, MD  . Dexa  12/2014    T -4.4 spine  . Esophagogastroduodenoscopy N/A 01/09/2015    Procedure: ESOPHAGOGASTRODUODENOSCOPY (EGD);  Surgeon: Inda Castle, MD;  Location: [facility];  Service: Endoscopy;  Laterality: N/A;  help with transfers    There were no vitals filed for this visit.  Visit Diagnosis:  Abnormality of gait - Plan: PT plan of care cert/re-cert  Right leg weakness - Plan: PT  plan of care cert/re-cert  Incoordination of extremity - Plan: PT plan of care cert/re-cert      Subjective Assessment - 02/01/15 0943    Symptoms Pt. reports she is not doing as well as she was at time of last visit, which was 12-26-14; pt. is now leaning to left side in sitting and standing and walking - difficult to maintain upright position   Patient Stated Goals improve walking and mobility   Currently in Pain? No/denies                       [facility] Adult PT Treatment/Exercise - 02/01/15 0001    Transfers   Transfers Sit to Stand;Stand to Sit   Sit to Stand 3: Mod assist  occasional min assist but performance fluctuates   Ambulation/Gait   Ambulation/Gait Yes   Ambulation/Gait Assistance 4: Min guard   Ambulation Distance (Feet) 240 Feet   Assistive device Rolling walker   Gait Pattern Step-to pattern   Ambulation Surface Level     Neuro re-ed; BIG exercises in seated position - reaching down and up x 10 reps each; trunk rotation to R and L sides - With attempting to clap hands together x 5 reps each side;  sit to stand from mat x 10 reps each with min to mod assist Leaning down on R and L forearm with return to upright x 5 reps each side with CGA; tactile cues to sit erect and not lean  Toward left side  TherEx:  Stretching left side of trunk with leaning onto right forearm and reaching overhead with LUE;  Trunk rotation stretch to  R and L sides x 30 sec hold x 2 reps each           PT Education - 02/01/15 0948    Education provided Yes   Education Details added stretching left lateral trunk by leaning down onto right forearm in seated position and reaching overhead toward right side - hold 30 secs   Person(s) Educated Patient   Methods Explanation;Demonstration   Comprehension Verbalized understanding;Returned demonstration          PT Short Term Goals - 12/17/14 1638    PT SHORT TERM GOAL #1   Title Pt. will increase standing tolerance so she  is able to report standing 5" at home with S for independence with ADL's.   Baseline met 11-19-14   Time 4   Period Weeks   Status Achieved   PT SHORT TERM GOAL #3   Title Transfer sit to stand from wheelchair with CGA   Baseline met 11-19-14   Time 4   Period Weeks   Status Achieved   PT SHORT TERM GOAL #4   Title Independent in HEP    Status Achieved           PT Long Term Goals - 02/01/15 0955    PT LONG TERM GOAL #2   Title Report ability to stand at least 10" at home for improved standing tolerance   Baseline target date 03-03-15   Time 4   Period Weeks   Status On-going   PT LONG TERM GOAL #3   Title Report at least 25% less pain in RLE with weight-bearing   Status Deferred   PT LONG TERM GOAL #4   Title Perform at least 15" on Nustep to demo improved activity tolerance/endurance   Baseline target date 03-03-15   Time 4   Period Weeks   Status New   PT LONG TERM GOAL #5   Title Amb. 39' with RW with CGA on flat even surface with less than 3 freezing episodes.   Baseline target date 03-03-15   Time 4   Period Weeks   Status New   PT LONG TERM GOAL #6   Title Improve TUG score to </= 28 secs. with RW with CGA.   Baseline 35.78 secs with RW      03-03-15 target date   Time 4   Period Weeks   Status On-going   PT LONG TERM GOAL #7   Title Transfer sit to stand 5 times with CGA for incr safety with transfers   Baseline min to mod assist 01-31-15      target date 03-03-15   Time 4   Period Weeks   Status New               Plan - 02/01/15 4627    Clinical Impression Statement Pt. had no occurrences of freezing during gait but is now significantly leaning toward left side - postural abnormality now present; tightness is mainly in trunk region as less hamstring tightness noted bilaterally   Pt will benefit from skilled therapeutic intervention in order to improve on the  following deficits Abnormal gait;Decreased coordination;Impaired flexibility;Difficulty  walking;Decreased balance;Increased muscle spasms;Impaired tone;Decreased mobility;Decreased strength   Rehab Potential Fair   PT Frequency 2x / week   PT Duration 4 weeks   PT Treatment/Interventions ADLs/Self Care Home Management;Therapeutic activities;Patient/family education;Therapeutic exercise;Gait training;Balance training;Stair training;Neuromuscular re-education;Functional mobility training   PT Next Visit Plan continue balance and gait   PT Home Exercise Plan as instructed   Consulted and Agree with Plan of Care Patient          G-Codes - 02-15-15 0958    Functional Assessment Tool Used clinical judgment   Functional Limitation Mobility: Walking and moving around   Mobility: Walking and Moving Around Current Status [phone]) At least 80 percent but less than 100 percent impaired, limited or restricted   Mobility: Walking and Moving Around Goal Status [phone]) At least 60 percent but less than 80 percent impaired, limited or restricted      Problem List Patient Active Problem List   Diagnosis Date Noted  . Dyspepsia 01/09/2015  . Constipation 12/14/2014  . Medicare annual wellness visit, subsequent 11/27/2014  . Advanced care planning/counseling discussion 11/27/2014  . Osteoporosis   . B-complex deficiency 06/07/2014  . Recurrent falls 02/06/2014  . Multiple system atrophy   . Dysphagia, unspecified(787.20) 07/28/2013  . Lower extremity edema 09/26/2012  . Shoulder pain, left 07/20/2012  . 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 Routine health maintenance 08/26/2011  . Murmur 05/27/2011  . HYPERCHOLESTEROLEMIA  IIA 05/14/2010  . VENOUS INSUFFICIENCY, LEGS 06/21/2009  . CORONARY HEART DISEASE 06/09/2008  . Hypothyroidism 05/15/2008  . ASTHMA 05/15/2008  . Depression 11/07/2007  . 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 GERD 11/07/2007  . VAGINITIS, ATROPHIC 11/07/2007    Alda Lea, PT 02-15-2015, 10:03 AM  [facility] [address] [place], [place], [address] Phone: [phone]   Fax:  [phone]

## 2015-02-05 ENCOUNTER — Encounter: Payer: Self-pay | Admitting: Physical Therapy

## 2015-02-05 ENCOUNTER — Ambulatory Visit: Payer: Medicare Other | Admitting: Physical Therapy

## 2015-02-05 DIAGNOSIS — R29898 Other symptoms and signs involving the musculoskeletal system: Secondary | ICD-10-CM | POA: Diagnosis not present

## 2015-02-05 DIAGNOSIS — R269 Unspecified abnormalities of gait and mobility: Secondary | ICD-10-CM | POA: Diagnosis not present

## 2015-02-05 DIAGNOSIS — I2510 Atherosclerotic heart disease of native coronary artery without angina pectoris: Secondary | ICD-10-CM | POA: Diagnosis not present

## 2015-02-05 DIAGNOSIS — K219 Gastro-esophageal reflux disease without esophagitis: Secondary | ICD-10-CM | POA: Diagnosis not present

## 2015-02-05 DIAGNOSIS — E785 Hyperlipidemia, unspecified: Secondary | ICD-10-CM | POA: Diagnosis not present

## 2015-02-05 DIAGNOSIS — R278 Other lack of coordination: Secondary | ICD-10-CM

## 2015-02-05 NOTE — Therapy (Signed)
[name] [address] [place], [place], [address] Phone: [phone]   Fax:  [phone]  Physical Therapy Treatment  Patient Details  Name: [name] [name] MRN: [id number] Date of Birth: 25-Nov-1940 Referring Provider:  Ria Bush, MD  Encounter Date: 02/05/2015      PT End of Session - 02/05/15 1459    Visit Number 2   Number of Visits 9   Date for PT Re-Evaluation 03/03/15   Authorization Type [employer] [facility]   Authorization Time Period 01-31-15 - 04-01-15   PT Start Time 1404   PT Stop Time 1508   PT Time Calculation (min) 64 min   Equipment Utilized During Treatment Gait belt   Activity Tolerance Patient tolerated treatment well   Behavior During Therapy [facility] for tasks assessed/performed      Past Medical History  Diagnosis Date  . GERD (gastroesophageal reflux disease)     s/p nissen  . Familial tremor     followed by Dr. Erling Cruz  . Unspecified chronic bronchitis   . Irritable bladder   . History of pneumonia   . Hypothyroidism   . Depression   . Asthma   . CAD (coronary artery disease)     myoview 5/08:  EF 76%, no scar, no ischemia, +ECG changes with exercise;  cath 04/08/07:  pLAD 30%, mLAD 60-70% - med tx.  Marland Kitchen HLD (hyperlipidemia)   . Multiple system atrophy     [name]  . VAGINITIS, ATROPHIC 11/07/2007  . Osteoporosis 2016    DEXA T-4.4 spine deteriorated since 2012    Past Surgical History  Procedure Laterality Date  . Laparoscopic nissen fundoplication    . Cystectomy    . Tumor removal    . Tubal ligation    . Colonoscopy N/A 08/29/2013    int hemm; Inda Castle, MD  . Dexa  12/2014    T -4.4 spine  . Esophagogastroduodenoscopy N/A 01/09/2015    Procedure: ESOPHAGOGASTRODUODENOSCOPY (EGD);  Surgeon: Inda Castle, MD;  Location: [facility];  Service: Endoscopy;  Laterality: N/A;  help with transfers    There were no vitals filed for this visit.  Visit Diagnosis:   Abnormality of gait  Incoordination of extremity      Subjective Assessment - 02/05/15 1408    Symptoms Pt reports she continues to lean to the left but trying to work on being more aware of it.   Patient Stated Goals improve walking and mobility   Currently in Pain? No/denies                       [facility] Adult PT Treatment/Exercise - 02/05/15 1455    Ambulation/Gait   Ambulation/Gait Yes   Ambulation/Gait Assistance 4: Min guard;4: Min assist   Ambulation/Gait Assistance Details cues and assist to steer RW and cues for upright posture;leans to left   Ambulation Distance (Feet) 350 Feet   Assistive device Rolling walker   Gait Pattern Step-to pattern;Lateral trunk lean to left   Ambulation Surface Level;Indoor   Knee/Hip Exercises: Aerobic   Stationary Bike Scifit level 1.5 all 4 extremities x 14 minutes;used stool under feet to scoot onto chair     Seated posture control with mirror for visual cues.  Propping on R elbow with L UE raised overhead to stretch trunk on L x 8 reps. Seated PWR! Up x 20. Floor to ceiling reach x 10.  PT Education - 02/05/15 1458    Education provided Yes   Education Details anti tippers on wheelchair and their purpose;posterior lean with standing causing chair to push back away from pt   Person(s) Educated Patient;Spouse   Methods Explanation;Demonstration   Comprehension Verbalized understanding          PT Short Term Goals - 12/17/14 1638    PT SHORT TERM GOAL #1   Title Pt. will increase standing tolerance so she is able to report standing 5" at home with S for independence with ADL's.   Baseline met 11-19-14   Time 4   Period Weeks   Status Achieved   PT SHORT TERM GOAL #3   Title Transfer sit to stand from wheelchair with CGA   Baseline met 11-19-14   Time 4   Period Weeks   Status Achieved   PT SHORT TERM GOAL #4   Title Independent in HEP    Status Achieved           PT Long Term Goals -  02/01/15 0955    PT LONG TERM GOAL #2   Title Report ability to stand at least 10" at home for improved standing tolerance   Baseline target date 03-03-15   Time 4   Period Weeks   Status On-going   PT LONG TERM GOAL #3   Title Report at least 25% less pain in RLE with weight-bearing   Status Deferred   PT LONG TERM GOAL #4   Title Perform at least 15" on Nustep to demo improved activity tolerance/endurance   Baseline target date 03-03-15   Time 4   Period Weeks   Status New   PT LONG TERM GOAL #5   Title Amb. 10' with RW with CGA on flat even surface with less than 3 freezing episodes.   Baseline target date 03-03-15   Time 4   Period Weeks   Status New   PT LONG TERM GOAL #6   Title Improve TUG score to </= 28 secs. with RW with CGA.   Baseline 35.78 secs with RW      03-03-15 target date   Time 4   Period Weeks   Status On-going   PT LONG TERM GOAL #7   Title Transfer sit to stand 5 times with CGA for incr safety with transfers   Baseline min to mod assist 01-31-15      target date 03-03-15   Time 4   Period Weeks   Status New               Plan - 02/05/15 1459    Clinical Impression Statement Continues with significant lean to left side in sitting and with ambulation.  Continue PT per POC.   Pt will benefit from skilled therapeutic intervention in order to improve on the following deficits Abnormal gait;Decreased coordination;Impaired flexibility;Difficulty walking;Decreased balance;Increased muscle spasms;Impaired tone;Decreased mobility;Decreased strength   PT Frequency 2x / week   PT Duration 4 weeks   PT Treatment/Interventions ADLs/Self Care Home Management;Therapeutic activities;Patient/family education;Therapeutic exercise;Gait training;Balance training;Stair training;Neuromuscular re-education;Functional mobility training   PT Next Visit Plan Continue gait, trunk control, strengthening   Consulted and Agree with Plan of Care Patient        Problem  List Patient Active Problem List   Diagnosis Date Noted  . Dyspepsia 01/09/2015  . Constipation 12/14/2014  . Medicare annual wellness visit, subsequent 11/27/2014  . Advanced care planning/counseling discussion 11/27/2014  . Osteoporosis   . B-complex  deficiency 06/07/2014  . Recurrent falls 02/06/2014  . Multiple system atrophy   . Dysphagia, unspecified(787.20) 07/28/2013  . Lower extremity edema 09/26/2012  . Shoulder pain, left 07/20/2012  . 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 Routine health maintenance 08/26/2011  . Murmur 05/27/2011  . HYPERCHOLESTEROLEMIA  IIA 05/14/2010  . VENOUS INSUFFICIENCY, LEGS 06/21/2009  . CORONARY HEART DISEASE 06/09/2008  . Hypothyroidism 05/15/2008  . ASTHMA 05/15/2008  . Depression 11/07/2007  . 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 GERD 11/07/2007  . VAGINITIS, ATROPHIC 11/07/2007    [name] 02/05/2015, 3:19 PM  [facility] [address] [place], [place], [address] Phone: [phone]   Fax:  [place], [place] 02/05/2015 3:19 PM Phone: [phone] Fax: [phone]

## 2015-02-11 ENCOUNTER — Other Ambulatory Visit: Payer: Self-pay | Admitting: Gastroenterology

## 2015-02-12 ENCOUNTER — Ambulatory Visit: Payer: Medicare Other | Attending: Neurology | Admitting: Physical Therapy

## 2015-02-12 DIAGNOSIS — R269 Unspecified abnormalities of gait and mobility: Secondary | ICD-10-CM | POA: Insufficient documentation

## 2015-02-12 DIAGNOSIS — I2510 Atherosclerotic heart disease of native coronary artery without angina pectoris: Secondary | ICD-10-CM | POA: Insufficient documentation

## 2015-02-12 DIAGNOSIS — R278 Other lack of coordination: Secondary | ICD-10-CM | POA: Diagnosis not present

## 2015-02-12 DIAGNOSIS — E785 Hyperlipidemia, unspecified: Secondary | ICD-10-CM | POA: Insufficient documentation

## 2015-02-12 DIAGNOSIS — K219 Gastro-esophageal reflux disease without esophagitis: Secondary | ICD-10-CM | POA: Insufficient documentation

## 2015-02-12 DIAGNOSIS — R29898 Other symptoms and signs involving the musculoskeletal system: Secondary | ICD-10-CM | POA: Insufficient documentation

## 2015-02-13 ENCOUNTER — Encounter: Payer: Self-pay | Admitting: Physical Therapy

## 2015-02-13 NOTE — Therapy (Signed)
[facility] [address] [place], [place], [address] Phone: [phone]   Fax:  [phone]  Physical Therapy Treatment  Patient Details  Name: [name] [name] MRN: [id number] Date of Birth: Oct 13, 1941 Referring Provider:  Ria Bush, MD  Encounter Date: 02/12/2015      PT End of Session - 02/13/15 1110    Visit Number 3  G3   Number of Visits 9   Date for PT Re-Evaluation 03/03/15   Authorization Type UHC [facility]   Authorization Time Period 01-31-15 - 04-01-15   PT Start Time 1319   PT Stop Time 1412   PT Time Calculation (min) 53 min      Past Medical History  Diagnosis Date  . GERD (gastroesophageal reflux disease)     s/p nissen  . Familial tremor     followed by Dr. Erling Cruz  . Unspecified chronic bronchitis   . Irritable bladder   . History of pneumonia   . Hypothyroidism   . Depression   . Asthma   . CAD (coronary artery disease)     myoview 5/08:  EF 76%, no scar, no ischemia, +ECG changes with exercise;  cath 04/08/07:  pLAD 30%, mLAD 60-70% - med tx.  Marland Kitchen HLD (hyperlipidemia)   . Multiple system atrophy     [name]  . VAGINITIS, ATROPHIC 11/07/2007  . Osteoporosis 2016    DEXA T-4.4 spine deteriorated since 2012    Past Surgical History  Procedure Laterality Date  . Laparoscopic nissen fundoplication    . Cystectomy    . Tumor removal    . Tubal ligation    . Colonoscopy N/A 08/29/2013    int hemm; Inda Castle, MD  . Dexa  12/2014    T -4.4 spine  . Esophagogastroduodenoscopy N/A 01/09/2015    Procedure: ESOPHAGOGASTRODUODENOSCOPY (EGD);  Surgeon: Inda Castle, MD;  Location: [facility];  Service: Endoscopy;  Laterality: N/A;  help with transfers    There were no vitals filed for this visit.  Visit Diagnosis:  Abnormality of gait  Incoordination of extremity      Subjective Assessment - 02/13/15 1108    Subjective Pt. reports she is not leaning as much as she  was at time of returning to PT 2 weeks ago- has been trying to work on it at home; has more GI tests scheduled to Thurs. - needs to cancel PT appt   Patient Stated Goals improve walking and mobility   Currently in Pain? No/denies                       [facility] Adult PT Treatment/Exercise - 02/13/15 0001    Ambulation/Gait   Ambulation/Gait Yes   Ambulation/Gait Assistance 4: Min guard;4: Min assist   Ambulation Distance (Feet) 240 Feet   Assistive device Rolling walker   Gait Pattern Step-to pattern;Lateral trunk lean to left   Ambulation Surface Level   High Level Balance   High Level Balance Activities Negotiating over obstacles  with bil. UE support 10 reps each - stepping over bolster   Knee/Hip Exercises: Aerobic   Stationary Bike Scifit level 1.5 all 4 extremities x  minutes;used stool under feet to scoot onto chair     NeuroRe-ed: BIG exercises seated - reaching down and up x 10 reps; trunk rotation x 5 reps to each side  TherEX; trunk stretches to R and L sides - 30 sec hold - with leaning over  onto forearm with overhead reach and Rotation with hands on mat - tactile cues to hold head upright - 30 sec hold x 2 reps to each side            PT Short Term Goals - 12/17/14 1638    PT SHORT TERM GOAL #1   Title Pt. will increase standing tolerance so she is able to report standing 5" at home with S for independence with ADL's.   Baseline met 11-19-14   Time 4   Period Weeks   Status Achieved   PT SHORT TERM GOAL #3   Title Transfer sit to stand from wheelchair with CGA   Baseline met 11-19-14   Time 4   Period Weeks   Status Achieved   PT SHORT TERM GOAL #4   Title Independent in HEP    Status Achieved           PT Long Term Goals - 02/01/15 0955    PT LONG TERM GOAL #2   Title Report ability to stand at least 10" at home for improved standing tolerance   Baseline target date 03-03-15   Time 4   Period Weeks   Status On-going   PT LONG TERM  GOAL #3   Title Report at least 25% less pain in RLE with weight-bearing   Status Deferred   PT LONG TERM GOAL #4   Title Perform at least 15" on Nustep to demo improved activity tolerance/endurance   Baseline target date 03-03-15   Time 4   Period Weeks   Status New   PT LONG TERM GOAL #5   Title Amb. 3' with RW with CGA on flat even surface with less than 3 freezing episodes.   Baseline target date 03-03-15   Time 4   Period Weeks   Status New   PT LONG TERM GOAL #6   Title Improve TUG score to </= 28 secs. with RW with CGA.   Baseline 35.78 secs with RW      03-03-15 target date   Time 4   Period Weeks   Status On-going   PT LONG TERM GOAL #7   Title Transfer sit to stand 5 times with CGA for incr safety with transfers   Baseline min to mod assist 01-31-15      target date 03-03-15   Time 4   Period Weeks   Status New               Plan - 02/13/15 1112    Clinical Impression Statement Pt. has continued trunk and RLE tightness with more tightness noted in R ankle with R foot plantarfexed and supinated; difficult to passively range to neutral   Pt will benefit from skilled therapeutic intervention in order to improve on the following deficits Abnormal gait;Decreased coordination;Impaired flexibility;Difficulty walking;Decreased balance;Increased muscle spasms;Impaired tone;Decreased mobility;Decreased strength   Rehab Potential Fair   PT Frequency 2x / week   PT Duration 4 weeks   PT Treatment/Interventions ADLs/Self Care Home Management;Therapeutic activities;Patient/family education;Therapeutic exercise;Gait training;Balance training;Stair training;Neuromuscular re-education;Functional mobility training   PT Next Visit Plan Continue gait, trunk control, strengthening   PT Home Exercise Plan as instructed   Consulted and Agree with Plan of Care Patient        Problem List Patient Active Problem List   Diagnosis Date Noted  . Dyspepsia 01/09/2015  . Constipation  12/14/2014  . Medicare annual wellness visit, subsequent 11/27/2014  . Advanced care planning/counseling discussion 11/27/2014  .  Osteoporosis   . B-complex deficiency 06/07/2014  . Recurrent falls 02/06/2014  . Multiple system atrophy   . Dysphagia, unspecified(787.20) 07/28/2013  . Lower extremity edema 09/26/2012  . Shoulder pain, left 07/20/2012  . 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 Routine health maintenance 08/26/2011  . Murmur 05/27/2011  . HYPERCHOLESTEROLEMIA  IIA 05/14/2010  . VENOUS INSUFFICIENCY, LEGS 06/21/2009  . CORONARY HEART DISEASE 06/09/2008  . Hypothyroidism 05/15/2008  . ASTHMA 05/15/2008  . Depression 11/07/2007  . 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 GERD 11/07/2007  . VAGINITIS, ATROPHIC 11/07/2007    Alda Lea, PT 02/13/2015, 11:14 AM  [facility] [address] [place], [place], [address] Phone: [phone]   Fax:  [phone]

## 2015-02-14 ENCOUNTER — Ambulatory Visit (HOSPITAL_COMMUNITY)
Admission: RE | Admit: 2015-02-14 | Discharge: 2015-02-14 | Disposition: A | Discharge status: Home / Self Care | Payer: Medicare Other | Source: Ambulatory Visit | Attending: Gastroenterology | Admitting: Gastroenterology

## 2015-02-14 ENCOUNTER — Ambulatory Visit: Payer: Medicare Other | Admitting: Physical Therapy

## 2015-02-14 DIAGNOSIS — R112 Nausea with vomiting, unspecified: Secondary | ICD-10-CM | POA: Diagnosis not present

## 2015-02-14 DIAGNOSIS — R140 Abdominal distension (gaseous): Secondary | ICD-10-CM | POA: Diagnosis not present

## 2015-02-14 DIAGNOSIS — R1013 Epigastric pain: Secondary | ICD-10-CM | POA: Diagnosis not present

## 2015-02-14 MED ORDER — TECHNETIUM TC 99M SULFUR COLLOID
2.0000 | Freq: Once | INTRAVENOUS | Status: AC | PRN
  Administered 2015-02-14: 2 via INTRAVENOUS

## 2015-02-18 ENCOUNTER — Encounter: Payer: Self-pay | Admitting: Gastroenterology

## 2015-02-18 ENCOUNTER — Telehealth: Payer: Self-pay | Admitting: Gastroenterology

## 2015-02-19 ENCOUNTER — Ambulatory Visit: Payer: Medicare Other | Admitting: Physical Therapy

## 2015-02-19 DIAGNOSIS — R269 Unspecified abnormalities of gait and mobility: Secondary | ICD-10-CM

## 2015-02-19 DIAGNOSIS — R278 Other lack of coordination: Secondary | ICD-10-CM | POA: Diagnosis not present

## 2015-02-19 DIAGNOSIS — R29898 Other symptoms and signs involving the musculoskeletal system: Secondary | ICD-10-CM | POA: Diagnosis not present

## 2015-02-19 DIAGNOSIS — I2510 Atherosclerotic heart disease of native coronary artery without angina pectoris: Secondary | ICD-10-CM | POA: Diagnosis not present

## 2015-02-19 DIAGNOSIS — K219 Gastro-esophageal reflux disease without esophagitis: Secondary | ICD-10-CM | POA: Diagnosis not present

## 2015-02-19 DIAGNOSIS — H8112 Benign paroxysmal vertigo, left ear: Secondary | ICD-10-CM

## 2015-02-19 DIAGNOSIS — E785 Hyperlipidemia, unspecified: Secondary | ICD-10-CM | POA: Diagnosis not present

## 2015-02-19 NOTE — Telephone Encounter (Signed)
She is calling about the GES results.

## 2015-02-19 NOTE — Telephone Encounter (Signed)
Letter forwarded to the patient's 'MyChart" account. Message left on her v/m to look for the letter and that the results were normal.

## 2015-02-19 NOTE — Telephone Encounter (Signed)
Scan was normal.  She should make a return office visit if she still having issues.

## 2015-02-20 ENCOUNTER — Other Ambulatory Visit: Payer: Self-pay | Admitting: Gastroenterology

## 2015-02-20 ENCOUNTER — Encounter: Payer: Self-pay | Admitting: Physical Therapy

## 2015-02-20 NOTE — Therapy (Signed)
[place] [address] [place], [place], [address] Phone: [phone]   Fax:  [phone]  Physical Therapy Treatment  Patient Details  Name: [name] [name] MRN: [id number] Date of Birth: March 02, 1941 Referring Provider:  Ria Bush, MD  Encounter Date: 02/19/2015      PT End of Session - 02/20/15 1011    Visit Number 4  G4   Number of Visits 9   Date for PT Re-Evaluation 03/03/15   Authorization Type [employer] [facility]   Authorization Time Period 01-31-15 - 04-01-15   PT Start Time 1638   PT Stop Time 1410   PT Time Calculation (min) 53 min   Equipment Utilized During Treatment Gait belt      Past Medical History  Diagnosis Date  . GERD (gastroesophageal reflux disease)     s/p nissen  . Familial tremor     followed by Dr. Erling Cruz  . Unspecified chronic bronchitis   . Irritable bladder   . History of pneumonia   . Hypothyroidism   . Depression   . Asthma   . CAD (coronary artery disease)     myoview 5/08:  EF 76%, no scar, no ischemia, +ECG changes with exercise;  cath 04/08/07:  pLAD 30%, mLAD 60-70% - med tx.  Marland Kitchen HLD (hyperlipidemia)   . Multiple system atrophy     [name]  . VAGINITIS, ATROPHIC 11/07/2007  . Osteoporosis 2016    DEXA T-4.4 spine deteriorated since 2012    Past Surgical History  Procedure Laterality Date  . Laparoscopic nissen fundoplication    . Cystectomy    . Tumor removal    . Tubal ligation    . Colonoscopy N/A 08/29/2013    int hemm; Inda Castle, MD  . Dexa  12/2014    T -4.4 spine  . Esophagogastroduodenoscopy N/A 01/09/2015    Procedure: ESOPHAGOGASTRODUODENOSCOPY (EGD);  Surgeon: Inda Castle, MD;  Location: [facility];  Service: Endoscopy;  Laterality: N/A;  help with transfers    There were no vitals filed for this visit.  Visit Diagnosis:  BPPV (benign paroxysmal positional vertigo), left  Abnormality of gait      Subjective Assessment -  02/20/15 1009    Subjective Pt. reports severe vertigo when lying back and sitting up during gastroenterology test last week   Patient Stated Goals improve walking and mobility   Currently in Pain? No/denies                       [facility] Adult PT Treatment/Exercise - 02/20/15 0001    Ambulation/Gait   Ambulation/Gait Yes   Ambulation/Gait Assistance 4: Min guard;4: Min assist   Ambulation/Gait Assistance Details cues to stay inside walker   Ambulation Distance (Feet) 240 Feet   Assistive device Rolling walker   Gait Pattern Step-to pattern;Lateral trunk lean to left   Ambulation Surface Level;Indoor   Knee/Hip Exercises: Aerobic   Stationary Bike Scifit level 1.5 all 4 extremities x  15 minutes;used stool under feet to scoot onto chair     TherEx: trunk stretches R and L sides 2 reps 30 sec hold; bil.  Hamstring stretch 30 sec hold x 2 reps each passively  SciFit level 1.5 x 15" with UE's and LE's - No charge as unsupervised   NeuroRe-ed: - lying supine rolling from back to right side with arms extended - clapping opposite hand in rolling to assist with gaining Momentum x 5 reps ;  vertigo occurred with rolling so treatment focus shifted to positional testing L Dix-Hallpike test positive - L rotary nystagmus and c/o vertigo in Dix-Hallpike test position.- indicative of L posterior canal BPPV  Canalith repositioning maneuver x 1 rep for L BPPV - pt. Became nauseaus and requested to withhold rx for this during session           PT Short Term Goals - 12/17/14 1638    PT SHORT TERM GOAL #1   Title Pt. will increase standing tolerance so she is able to report standing 5" at home with S for independence with ADL's.   Baseline met 11-19-14   Time 4   Period Weeks   Status Achieved   PT SHORT TERM GOAL #3   Title Transfer sit to stand from wheelchair with CGA   Baseline met 11-19-14   Time 4   Period Weeks   Status Achieved   PT SHORT TERM GOAL #4   Title  Independent in HEP    Status Achieved           PT Long Term Goals - 02/01/15 0955    PT LONG TERM GOAL #2   Title Report ability to stand at least 10" at home for improved standing tolerance   Baseline target date 03-03-15   Time 4   Period Weeks   Status On-going   PT LONG TERM GOAL #3   Title Report at least 25% less pain in RLE with weight-bearing   Status Deferred   PT LONG TERM GOAL #4   Title Perform at least 15" on Nustep to demo improved activity tolerance/endurance   Baseline target date 03-03-15   Time 4   Period Weeks   Status New   PT LONG TERM GOAL #5   Title Amb. 49' with RW with CGA on flat even surface with less than 3 freezing episodes.   Baseline target date 03-03-15   Time 4   Period Weeks   Status New   PT LONG TERM GOAL #6   Title Improve TUG score to </= 28 secs. with RW with CGA.   Baseline 35.78 secs with RW      03-03-15 target date   Time 4   Period Weeks   Status On-going   PT LONG TERM GOAL #7   Title Transfer sit to stand 5 times with CGA for incr safety with transfers   Baseline min to mod assist 01-31-15      target date 03-03-15   Time 4   Period Weeks   Status New               Plan - 02/20/15 1012    Clinical Impression Statement Pt. has signs and symptoms consistent with L BPPV with left rotary nystagmsu noted - pt. became nauseaus after 1 rep of Epley maneuver for treatment of L BPPV and requestd to stop RX for this for today   Pt will benefit from skilled therapeutic intervention in order to improve on the following deficits Abnormal gait;Decreased coordination;Impaired flexibility;Difficulty walking;Decreased balance;Increased muscle spasms;Impaired tone;Decreased mobility;Decreased strength   Rehab Potential Fair   PT Frequency 2x / week   PT Duration 4 weeks   PT Treatment/Interventions ADLs/Self Care Home Management;Therapeutic activities;Patient/family education;Therapeutic exercise;Gait training;Balance training;Stair  training;Neuromuscular re-education;Functional mobility training   PT Next Visit Plan Continue gait, trunk control, strengthening   PT Home Exercise Plan as instructed   Consulted and Agree with Plan of Care Patient  Problem List Patient Active Problem List   Diagnosis Date Noted  . Dyspepsia 01/09/2015  . Constipation 12/14/2014  . Medicare annual wellness visit, subsequent 11/27/2014  . Advanced care planning/counseling discussion 11/27/2014  . Osteoporosis   . B-complex deficiency 06/07/2014  . Recurrent falls 02/06/2014  . Multiple system atrophy   . Dysphagia, unspecified(787.20) 07/28/2013  . Lower extremity edema 09/26/2012  . Shoulder pain, left 07/20/2012  . 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 Routine health maintenance 08/26/2011  . Murmur 05/27/2011  . HYPERCHOLESTEROLEMIA  IIA 05/14/2010  . VENOUS INSUFFICIENCY, LEGS 06/21/2009  . CORONARY HEART DISEASE 06/09/2008  . Hypothyroidism 05/15/2008  . ASTHMA 05/15/2008  . Depression 11/07/2007  . 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 GERD 11/07/2007  . VAGINITIS, ATROPHIC 11/07/2007    Alda Lea, PT 02/20/2015, 10:16 AM  [place] [address] [place], [place], [address] Phone: [phone]   Fax:  [phone]

## 2015-02-21 ENCOUNTER — Ambulatory Visit: Payer: Medicare Other | Admitting: Physical Therapy

## 2015-02-21 ENCOUNTER — Encounter: Payer: Self-pay | Admitting: Physical Therapy

## 2015-02-21 DIAGNOSIS — R278 Other lack of coordination: Secondary | ICD-10-CM | POA: Diagnosis not present

## 2015-02-21 DIAGNOSIS — R269 Unspecified abnormalities of gait and mobility: Secondary | ICD-10-CM | POA: Diagnosis not present

## 2015-02-21 DIAGNOSIS — E785 Hyperlipidemia, unspecified: Secondary | ICD-10-CM | POA: Diagnosis not present

## 2015-02-21 DIAGNOSIS — I2510 Atherosclerotic heart disease of native coronary artery without angina pectoris: Secondary | ICD-10-CM | POA: Diagnosis not present

## 2015-02-21 DIAGNOSIS — K219 Gastro-esophageal reflux disease without esophagitis: Secondary | ICD-10-CM | POA: Diagnosis not present

## 2015-02-21 DIAGNOSIS — R29898 Other symptoms and signs involving the musculoskeletal system: Secondary | ICD-10-CM | POA: Diagnosis not present

## 2015-02-21 NOTE — Therapy (Signed)
[place] [address] [place], [place], [address] Phone: [phone]   Fax:  [phone]  Physical Therapy Treatment  Patient Details  Name: [name] [name] MRN: [id number] Date of Birth: October 05, 1941 Referring Provider:  Ria Bush, MD  Encounter Date: 02/21/2015      PT End of Session - 02/21/15 1501    Visit Number 5  G5   Number of Visits 9   Date for PT Re-Evaluation 03/03/15   Authorization Type [employer] [facility]   Authorization Time Period 01-31-15 - 04-01-15   PT Start Time 1322   PT Stop Time 1403   PT Time Calculation (min) 41 min   Equipment Utilized During Treatment Gait belt   Activity Tolerance Patient tolerated treatment well   Behavior During Therapy [facility] for tasks assessed/performed      Past Medical History  Diagnosis Date  . GERD (gastroesophageal reflux disease)     s/p nissen  . Familial tremor     followed by Dr. Erling Cruz  . Unspecified chronic bronchitis   . Irritable bladder   . History of pneumonia   . Hypothyroidism   . Depression   . Asthma   . CAD (coronary artery disease)     myoview 5/08:  EF 76%, no scar, no ischemia, +ECG changes with exercise;  cath 04/08/07:  pLAD 30%, mLAD 60-70% - med tx.  Marland Kitchen HLD (hyperlipidemia)   . Multiple system atrophy     [name]  . VAGINITIS, ATROPHIC 11/07/2007  . Osteoporosis 2016    DEXA T-4.4 spine deteriorated since 2012    Past Surgical History  Procedure Laterality Date  . Laparoscopic nissen fundoplication    . Cystectomy    . Tumor removal    . Tubal ligation    . Colonoscopy N/A 08/29/2013    int hemm; Inda Castle, MD  . Dexa  12/2014    T -4.4 spine  . Esophagogastroduodenoscopy N/A 01/09/2015    Procedure: ESOPHAGOGASTRODUODENOSCOPY (EGD);  Surgeon: Inda Castle, MD;  Location: [facility];  Service: Endoscopy;  Laterality: N/A;  help with transfers    There were no vitals filed for this visit.  Visit  Diagnosis:  Abnormality of gait      Subjective Assessment - 02/21/15 1324    Subjective Pt denies vertigo since last visit but has not layed flat either (sleeps in recliner).  Has been more conscious of leaning ro the left.   Patient Stated Goals improve walking and mobility   Currently in Pain? No/denies                       [facility] Adult PT Treatment/Exercise - 02/21/15 1455    Transfers   Transfers Sit to Stand;Stand to Sit   Sit to Stand 4: Min assist;With upper extremity assist;From chair/3-in-1;Other/comment   Sit to Stand Details (indicate cue type and reason) needs mod assist at times if pushes posteriorly   Stand to Sit 4: Min guard   Ambulation/Gait   Ambulation/Gait Yes   Ambulation/Gait Assistance 4: Min guard;4: Min assist   Ambulation/Gait Assistance Details cues to stay inside walker and control lean to left   Ambulation Distance (Feet) 330 Feet  100   Assistive device Rolling walker   Gait Pattern Step-to pattern;Lateral trunk lean to left   Ambulation Surface Level;Indoor   Posture/Postural Control   Posture/Postural Control Postural limitations   Postural Limitations --  leans to left in sitting  and standing   Posture Comments seated edge of mat worked on upright posture, rotational stretch to left and right with 60 second hold x 2 and propping on forearm with opposite arm overhead reach x 30 sec x 3   High Level Balance   High Level Balance Activities Other (comment)   High Level Balance Comments standing at RW with intermittent UE support for upward reaching x 6 minutes with min guard/assist and cues to monitor posture.   Knee/Hip Exercises: Aerobic   Stationary Bike Scifit level 1.5 all 4 extremities x  20 minutes;used stool under feet to scoot onto chair-no charge as unsupervised while on Scifit   Knee/Hip Exercises: Standing   Other Standing Knee Exercises sit to stand x 10 for strengthening                  PT Short Term Goals -  12/17/14 1638    PT SHORT TERM GOAL #1   Title Pt. will increase standing tolerance so she is able to report standing 5" at home with S for independence with ADL's.   Baseline met 11-19-14   Time 4   Period Weeks   Status Achieved   PT SHORT TERM GOAL #3   Title Transfer sit to stand from wheelchair with CGA   Baseline met 11-19-14   Time 4   Period Weeks   Status Achieved   PT SHORT TERM GOAL #4   Title Independent in HEP    Status Achieved           PT Long Term Goals - 02/01/15 0955    PT LONG TERM GOAL #2   Title Report ability to stand at least 10" at home for improved standing tolerance   Baseline target date 03-03-15   Time 4   Period Weeks   Status On-going   PT LONG TERM GOAL #3   Title Report at least 25% less pain in RLE with weight-bearing   Status Deferred   PT LONG TERM GOAL #4   Title Perform at least 15" on Nustep to demo improved activity tolerance/endurance   Baseline target date 03-03-15   Time 4   Period Weeks   Status New   PT LONG TERM GOAL #5   Title Amb. 64' with RW with CGA on flat even surface with less than 3 freezing episodes.   Baseline target date 03-03-15   Time 4   Period Weeks   Status New   PT LONG TERM GOAL #6   Title Improve TUG score to </= 28 secs. with RW with CGA.   Baseline 35.78 secs with RW      03-03-15 target date   Time 4   Period Weeks   Status On-going   PT LONG TERM GOAL #7   Title Transfer sit to stand 5 times with CGA for incr safety with transfers   Baseline min to mod assist 01-31-15      target date 03-03-15   Time 4   Period Weeks   Status New               Plan - 02/21/15 1502    Clinical Impression Statement Pt with improved posture today with decreased lateral lean but still present with fatigue.  Continue PT per POC.   Pt will benefit from skilled therapeutic intervention in order to improve on the following deficits Abnormal gait;Decreased coordination;Impaired flexibility;Difficulty  walking;Decreased balance;Increased muscle spasms;Impaired tone;Decreased mobility;Decreased strength   Rehab Potential Fair  PT Frequency 2x / week   PT Duration 4 weeks   PT Treatment/Interventions ADLs/Self Care Home Management;Therapeutic activities;Patient/family education;Therapeutic exercise;Gait training;Balance training;Stair training;Neuromuscular re-education;Functional mobility training   PT Next Visit Plan Continue gait, trunk control, strengthening   PT Home Exercise Plan as instructed   Consulted and Agree with Plan of Care Patient        Problem List Patient Active Problem List   Diagnosis Date Noted  . Dyspepsia 01/09/2015  . Constipation 12/14/2014  . Medicare annual wellness visit, subsequent 11/27/2014  . Advanced care planning/counseling discussion 11/27/2014  . Osteoporosis   . B-complex deficiency 06/07/2014  . Recurrent falls 02/06/2014  . Multiple system atrophy   . Dysphagia, unspecified(787.20) 07/28/2013  . Lower extremity edema 09/26/2012  . Shoulder pain, left 07/20/2012  . 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 Routine health maintenance 08/26/2011  . Murmur 05/27/2011  . HYPERCHOLESTEROLEMIA  IIA 05/14/2010  . VENOUS INSUFFICIENCY, LEGS 06/21/2009  . CORONARY HEART DISEASE 06/09/2008  . Hypothyroidism 05/15/2008  . ASTHMA 05/15/2008  . Depression 11/07/2007  . 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 GERD 11/07/2007  . VAGINITIS, ATROPHIC 11/07/2007    [name] 02/21/2015, 3:05 PM  [place] [address] [place], [place], [address] Phone: [phone]   Fax:  [place] 02/21/2015 3:05 PM Phone: [phone] Fax: [phone]

## 2015-02-25 ENCOUNTER — Ambulatory Visit: Payer: Medicare Other | Admitting: Physical Therapy

## 2015-02-25 DIAGNOSIS — E785 Hyperlipidemia, unspecified: Secondary | ICD-10-CM | POA: Diagnosis not present

## 2015-02-25 DIAGNOSIS — R269 Unspecified abnormalities of gait and mobility: Secondary | ICD-10-CM

## 2015-02-25 DIAGNOSIS — R29898 Other symptoms and signs involving the musculoskeletal system: Secondary | ICD-10-CM | POA: Diagnosis not present

## 2015-02-25 DIAGNOSIS — R278 Other lack of coordination: Secondary | ICD-10-CM | POA: Diagnosis not present

## 2015-02-25 DIAGNOSIS — K219 Gastro-esophageal reflux disease without esophagitis: Secondary | ICD-10-CM | POA: Diagnosis not present

## 2015-02-25 DIAGNOSIS — I2510 Atherosclerotic heart disease of native coronary artery without angina pectoris: Secondary | ICD-10-CM | POA: Diagnosis not present

## 2015-02-25 DIAGNOSIS — M439 Deforming dorsopathy, unspecified: Secondary | ICD-10-CM

## 2015-02-26 ENCOUNTER — Encounter: Payer: Self-pay | Admitting: Physical Therapy

## 2015-02-26 NOTE — Therapy (Signed)
[place] [address] [place], [place], [address] Phone: [phone]   Fax:  [phone]  Physical Therapy Treatment  Patient Details  Name: [name] [name] MRN: [id number] Date of Birth: 14-May-1941 Referring Provider:  Ria Bush, MD  Encounter Date: 02/25/2015      PT End of Session - 02/26/15 1356    Visit Number 6  G6   Date for PT Re-Evaluation 03/03/15   Authorization Type [employer] [facility]   Authorization Time Period 01-31-15 - 04-01-15   PT Start Time 4081   PT Stop Time 1405   PT Time Calculation (min) 48 min      Past Medical History  Diagnosis Date  . GERD (gastroesophageal reflux disease)     s/p nissen  . Familial tremor     followed by Dr. Erling Cruz  . Unspecified chronic bronchitis   . Irritable bladder   . History of pneumonia   . Hypothyroidism   . Depression   . Asthma   . CAD (coronary artery disease)     myoview 5/08:  EF 76%, no scar, no ischemia, +ECG changes with exercise;  cath 04/08/07:  pLAD 30%, mLAD 60-70% - med tx.  Marland Kitchen HLD (hyperlipidemia)   . Multiple system atrophy     [name]  . VAGINITIS, ATROPHIC 11/07/2007  . Osteoporosis 2016    DEXA T-4.4 spine deteriorated since 2012    Past Surgical History  Procedure Laterality Date  . Laparoscopic nissen fundoplication    . Cystectomy    . Tumor removal    . Tubal ligation    . Colonoscopy N/A 08/29/2013    int hemm; Inda Castle, MD  . Dexa  12/2014    T -4.4 spine  . Esophagogastroduodenoscopy N/A 01/09/2015    Procedure: ESOPHAGOGASTRODUODENOSCOPY (EGD);  Surgeon: Inda Castle, MD;  Location: [facility];  Service: Endoscopy;  Laterality: N/A;  help with transfers    There were no vitals filed for this visit.  Visit Diagnosis:  Abnormality of gait  Postural deformity      Subjective Assessment - 02/26/15 1055    Subjective Pt. reports no changes and no falls - hasn't tried to lie flat or lie down to  know if vertigo has resolved or just not have been provoked   Patient Stated Goals improve walking and mobility   Currently in Pain? No/denies                         [facility] Adult PT Treatment/Exercise - 02/26/15 1057    Transfers   Transfers Sit to Stand;Stand to Sit   Sit to Stand 4: Min assist;With upper extremity assist;From chair/3-in-1;Other/comment   Sit to Stand Details (indicate cue type and reason) needs mod to min assist to shift COG anteriorly and to prevent posterior lean   Stand to Sit 4: Min guard   Ambulation/Gait   Ambulation/Gait Yes   Ambulation/Gait Assistance 4: Min guard;4: Min assist   Ambulation Distance (Feet) 320 Feet  pt. had to sit after 2 3/4 laps due to legs becoming weak   Assistive device Rolling walker   Gait Pattern Step-to pattern;Lateral trunk lean to left   Ambulation Surface Level;Indoor   Posture/Postural Control   Posture/Postural Control Postural limitations   Posture Comments seated edge of mat worked on upright posture, rotational stretch to left and right with 60 second hold x 2 and propping on forearm with opposite arm  overhead reach x 30 sec x 3   High Level Balance   High Level Balance Activities --  alterante tap ups to 4" step with mod assist   Knee/Hip Exercises: Aerobic   Stationary Bike Scifit level 1.5 all 4 extremities x  20 minutes;used stool under feet to scoot onto chair-no charge as unsupervised while on Scifit                  PT Short Term Goals - 12/17/14 1638    PT SHORT TERM GOAL #1   Title Pt. will increase standing tolerance so she is able to report standing 5" at home with S for independence with ADL's.   Baseline met 11-19-14   Time 4   Period Weeks   Status Achieved   PT SHORT TERM GOAL #3   Title Transfer sit to stand from wheelchair with CGA   Baseline met 11-19-14   Time 4   Period Weeks   Status Achieved   PT SHORT TERM GOAL #4   Title Independent in HEP    Status Achieved            PT Long Term Goals - 02/01/15 0955    PT LONG TERM GOAL #2   Title Report ability to stand at least 10" at home for improved standing tolerance   Baseline target date 03-03-15   Time 4   Period Weeks   Status On-going   PT LONG TERM GOAL #3   Title Report at least 25% less pain in RLE with weight-bearing   Status Deferred   PT LONG TERM GOAL #4   Title Perform at least 15" on Nustep to demo improved activity tolerance/endurance   Baseline target date 03-03-15   Time 4   Period Weeks   Status New   PT LONG TERM GOAL #5   Title Amb. 44' with RW with CGA on flat even surface with less than 3 freezing episodes.   Baseline target date 03-03-15   Time 4   Period Weeks   Status New   PT LONG TERM GOAL #6   Title Improve TUG score to </= 28 secs. with RW with CGA.   Baseline 35.78 secs with RW      03-03-15 target date   Time 4   Period Weeks   Status On-going   PT LONG TERM GOAL #7   Title Transfer sit to stand 5 times with CGA for incr safety with transfers   Baseline min to mod assist 01-31-15      target date 03-03-15   Time 4   Period Weeks   Status New               Plan - 02/26/15 1357    Clinical Impression Statement Pt. able to sit upright with tactile cues; decreased left cervical lateral flexion noted after cervical stretching; pt. had episode today of legs becoming weak with ambulation and needing to sit down quickly   Pt will benefit from skilled therapeutic intervention in order to improve on the following deficits Abnormal gait;Decreased coordination;Impaired flexibility;Difficulty walking;Decreased balance;Increased muscle spasms;Impaired tone;Decreased mobility;Decreased strength   Rehab Potential Fair   PT Frequency 2x / week   PT Duration 4 weeks   PT Treatment/Interventions ADLs/Self Care Home Management;Therapeutic activities;Patient/family education;Therapeutic exercise;Gait training;Balance training;Stair training;Neuromuscular  re-education;Functional mobility training   PT Next Visit Plan Continue gait, trunk control, strengthening   PT Home Exercise Plan as instructed   Consulted and Agree with Plan  of Care Patient        Problem List Patient Active Problem List   Diagnosis Date Noted  . Dyspepsia 01/09/2015  . Constipation 12/14/2014  . Medicare annual wellness visit, subsequent 11/27/2014  . Advanced care planning/counseling discussion 11/27/2014  . Osteoporosis   . B-complex deficiency 06/07/2014  . Recurrent falls 02/06/2014  . Multiple system atrophy   . Dysphagia, unspecified(787.20) 07/28/2013  . Lower extremity edema 09/26/2012  . Shoulder pain, left 07/20/2012  . 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 Routine health maintenance 08/26/2011  . Murmur 05/27/2011  . HYPERCHOLESTEROLEMIA  IIA 05/14/2010  . VENOUS INSUFFICIENCY, LEGS 06/21/2009  . CORONARY HEART DISEASE 06/09/2008  . Hypothyroidism 05/15/2008  . ASTHMA 05/15/2008  . Depression 11/07/2007  . 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 GERD 11/07/2007  . VAGINITIS, ATROPHIC 11/07/2007    Alda Lea, PT 02/26/2015, 2:03 PM  [place] [address] [place], [place], [address] Phone: [phone]   Fax:  [phone]

## 2015-02-28 ENCOUNTER — Ambulatory Visit: Payer: Medicare Other | Admitting: Physical Therapy

## 2015-02-28 DIAGNOSIS — R269 Unspecified abnormalities of gait and mobility: Secondary | ICD-10-CM | POA: Diagnosis not present

## 2015-02-28 DIAGNOSIS — I2510 Atherosclerotic heart disease of native coronary artery without angina pectoris: Secondary | ICD-10-CM | POA: Diagnosis not present

## 2015-02-28 DIAGNOSIS — R29898 Other symptoms and signs involving the musculoskeletal system: Secondary | ICD-10-CM

## 2015-02-28 DIAGNOSIS — K219 Gastro-esophageal reflux disease without esophagitis: Secondary | ICD-10-CM | POA: Diagnosis not present

## 2015-02-28 DIAGNOSIS — R278 Other lack of coordination: Secondary | ICD-10-CM

## 2015-02-28 DIAGNOSIS — M439 Deforming dorsopathy, unspecified: Secondary | ICD-10-CM

## 2015-02-28 DIAGNOSIS — E785 Hyperlipidemia, unspecified: Secondary | ICD-10-CM | POA: Diagnosis not present

## 2015-03-01 ENCOUNTER — Encounter: Payer: Self-pay | Admitting: Physical Therapy

## 2015-03-01 NOTE — Therapy (Signed)
[facility] [address] [place], [place], [address] Phone: [phone]   Fax:  [phone]  Physical Therapy Treatment  Patient Details  Name: [name] [name] MRN: [id number] Date of Birth: 05-14-1941 Referring Provider:  Ria Bush, MD  Encounter Date: 02/28/2015      PT End of Session - 03/01/15 1323    Visit Number 7  G7   Number of Visits 9   Date for PT Re-Evaluation 03/03/15   Authorization Type UHC MCR   Authorization Time Period 01-31-15 - 04-01-15   PT Start Time 1404   PT Stop Time 1450   PT Time Calculation (min) 46 min      Past Medical History  Diagnosis Date  . GERD (gastroesophageal reflux disease)     s/p nissen  . Familial tremor     followed by Dr. Erling Cruz  . Unspecified chronic bronchitis   . Irritable bladder   . History of pneumonia   . Hypothyroidism   . Depression   . Asthma   . CAD (coronary artery disease)     myoview 5/08:  EF 76%, no scar, no ischemia, +ECG changes with exercise;  cath 04/08/07:  pLAD 30%, mLAD 60-70% - med tx.  Marland Kitchen HLD (hyperlipidemia)   . Multiple system atrophy     [name]  . VAGINITIS, ATROPHIC 11/07/2007  . Osteoporosis 2016    DEXA T-4.4 spine deteriorated since 2012    Past Surgical History  Procedure Laterality Date  . Laparoscopic nissen fundoplication    . Cystectomy    . Tumor removal    . Tubal ligation    . Colonoscopy N/A 08/29/2013    int hemm; Inda Castle, MD  . Dexa  12/2014    T -4.4 spine  . Esophagogastroduodenoscopy N/A 01/09/2015    Procedure: ESOPHAGOGASTRODUODENOSCOPY (EGD);  Surgeon: Inda Castle, MD;  Location: [facility];  Service: Endoscopy;  Laterality: N/A;  help with transfers    There were no vitals filed for this visit.  Visit Diagnosis:  Abnormality of gait  Postural deformity  Incoordination of extremity  Right leg weakness      Subjective Assessment - 03/01/15 1318    Subjective  Pt. reports today has not been a good day - has had more difficulty moving and hasn't felt that good today; states balance is not as good today   Patient Stated Goals improve walking and mobility   Currently in Pain? No/denies                         [facility] Adult PT Treatment/Exercise - 03/01/15 0001    Transfers   Transfers Sit to Stand;Stand to Sit   Sit to Stand 4: Min assist;With upper extremity assist;From chair/3-in-1;Other/comment   Sit to Stand Details (indicate cue type and reason) needs assist. to shift weight anteriorly due to posterior lean   Stand to Sit 4: Min guard   Ambulation/Gait   Ambulation/Gait Yes   Ambulation/Gait Assistance 4: Min guard;4: Min assist   Ambulation/Gait Assistance Details cues to stay close inside RW   Ambulation Distance (Feet) 240 Feet   Assistive device Rolling walker   Gait Pattern Step-to pattern;Lateral trunk lean to left   Ambulation Surface Level;Indoor   Posture/Postural Control   Posture/Postural Control Postural limitations   Postural Limitations --  head is laterally flexed to left side   Posture Comments seated edge of mat worked on upright  posture, rotational stretch to left and right with 60 second hold x 2 and propping on forearm with opposite arm overhead reach x 30 sec x 3   High Level Balance   High Level Balance Activities Other (comment)   High Level Balance Comments standing at RW with intermittent UE support for upward reaching x 6 minutes with min guard/assist and cues to monitor posture.   Knee/Hip Exercises: Aerobic   Stationary Bike Scifit level 1.5 all 4 extremities x  15 minutes;used stool under feet to scoot onto chair-no charge as unsupervised while on Scifit                  PT Short Term Goals - 12/17/14 1638    PT SHORT TERM GOAL #1   Title Pt. will increase standing tolerance so she is able to report standing 5" at home with S for independence with ADL's.   Baseline met 11-19-14    Time 4   Period Weeks   Status Achieved   PT SHORT TERM GOAL #3   Title Transfer sit to stand from wheelchair with CGA   Baseline met 11-19-14   Time 4   Period Weeks   Status Achieved   PT SHORT TERM GOAL #4   Title Independent in HEP    Status Achieved           PT Long Term Goals - 02/01/15 0955    PT LONG TERM GOAL #2   Title Report ability to stand at least 10" at home for improved standing tolerance   Baseline target date 03-03-15   Time 4   Period Weeks   Status On-going   PT LONG TERM GOAL #3   Title Report at least 25% less pain in RLE with weight-bearing   Status Deferred   PT LONG TERM GOAL #4   Title Perform at least 15" on Nustep to demo improved activity tolerance/endurance   Baseline target date 03-03-15   Time 4   Period Weeks   Status New   PT LONG TERM GOAL #5   Title Amb. 17' with RW with CGA on flat even surface with less than 3 freezing episodes.   Baseline target date 03-03-15   Time 4   Period Weeks   Status New   PT LONG TERM GOAL #6   Title Improve TUG score to </= 28 secs. with RW with CGA.   Baseline 35.78 secs with RW      03-03-15 target date   Time 4   Period Weeks   Status On-going   PT LONG TERM GOAL #7   Title Transfer sit to stand 5 times with CGA for incr safety with transfers   Baseline min to mod assist 01-31-15      target date 03-03-15   Time 4   Period Weeks   Status New               Plan - 03/01/15 1325    Clinical Impression Statement Increased occurences of R foot freezing noted today - pt. needed max assist x 2 occurrences to remove right foot from bolster in doing stepping over/back activity to address balance deficits   Pt will benefit from skilled therapeutic intervention in order to improve on the following deficits Abnormal gait;Decreased coordination;Impaired flexibility;Difficulty walking;Decreased balance;Increased muscle spasms;Impaired tone;Decreased mobility;Decreased strength   Rehab Potential Fair    PT Frequency 2x / week   PT Duration 4 weeks   PT Treatment/Interventions ADLs/Self Care Home  Management;Therapeutic activities;Patient/family education;Therapeutic exercise;Gait training;Balance training;Stair training;Neuromuscular re-education;Functional mobility training   PT Next Visit Plan check goals - renew   PT Home Exercise Plan as instructed   Consulted and Agree with Plan of Care Patient        Problem List Patient Active Problem List   Diagnosis Date Noted  . Dyspepsia 01/09/2015  . Constipation 12/14/2014  . Medicare annual wellness visit, subsequent 11/27/2014  . Advanced care planning/counseling discussion 11/27/2014  . Osteoporosis   . B-complex deficiency 06/07/2014  . Recurrent falls 02/06/2014  . Multiple system atrophy   . Dysphagia, unspecified(787.20) 07/28/2013  . Lower extremity edema 09/26/2012  . Shoulder pain, left 07/20/2012  . 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 Routine health maintenance 08/26/2011  . Murmur 05/27/2011  . HYPERCHOLESTEROLEMIA  IIA 05/14/2010  . VENOUS INSUFFICIENCY, LEGS 06/21/2009  . CORONARY HEART DISEASE 06/09/2008  . Hypothyroidism 05/15/2008  . ASTHMA 05/15/2008  . Depression 11/07/2007  . 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 GERD 11/07/2007  . VAGINITIS, ATROPHIC 11/07/2007    Alda Lea, PT 03/01/2015, 1:35 PM  [facility] [address] [place], [place], [address] Phone: [phone]   Fax:  [phone]

## 2015-03-04 ENCOUNTER — Telehealth: Payer: Self-pay | Admitting: Gastroenterology

## 2015-03-04 ENCOUNTER — Ambulatory Visit: Payer: Medicare Other | Admitting: Physical Therapy

## 2015-03-04 DIAGNOSIS — R269 Unspecified abnormalities of gait and mobility: Secondary | ICD-10-CM | POA: Diagnosis not present

## 2015-03-04 DIAGNOSIS — R278 Other lack of coordination: Secondary | ICD-10-CM | POA: Diagnosis not present

## 2015-03-04 DIAGNOSIS — K219 Gastro-esophageal reflux disease without esophagitis: Secondary | ICD-10-CM | POA: Diagnosis not present

## 2015-03-04 DIAGNOSIS — E785 Hyperlipidemia, unspecified: Secondary | ICD-10-CM | POA: Diagnosis not present

## 2015-03-04 DIAGNOSIS — M439 Deforming dorsopathy, unspecified: Secondary | ICD-10-CM

## 2015-03-04 DIAGNOSIS — R29898 Other symptoms and signs involving the musculoskeletal system: Secondary | ICD-10-CM | POA: Diagnosis not present

## 2015-03-04 DIAGNOSIS — I2510 Atherosclerotic heart disease of native coronary artery without angina pectoris: Secondary | ICD-10-CM | POA: Diagnosis not present

## 2015-03-05 ENCOUNTER — Encounter: Payer: Self-pay | Admitting: Physical Therapy

## 2015-03-05 NOTE — Telephone Encounter (Signed)
I would take a Tums or another antacid tablet when necessary

## 2015-03-05 NOTE — Telephone Encounter (Signed)
Per patient request, left information on her voicemail.

## 2015-03-05 NOTE — Therapy (Signed)
[facility] [address] [place], [place], [address] Phone: [phone]   Fax:  [phone]  Physical Therapy Treatment  Patient Details  Name: [name] [name] MRN: [id number] Date of Birth: 04-12-1941 Referring Provider:  Ria Bush, MD  Encounter Date: 03/04/2015      PT End of Session - 03/05/15 1433    Visit Number 8  G8   Number of Visits 9   Date for PT Re-Evaluation 03/03/15   Authorization Type UHC [facility]   Authorization Time Period 01-31-15 - 04-01-15   PT Start Time 1315   PT Stop Time 1404   PT Time Calculation (min) 49 min   Equipment Utilized During Treatment Gait belt      Past Medical History  Diagnosis Date  . GERD (gastroesophageal reflux disease)     s/p nissen  . Familial tremor     followed by Dr. Erling Cruz  . Unspecified chronic bronchitis   . Irritable bladder   . History of pneumonia   . Hypothyroidism   . Depression   . Asthma   . CAD (coronary artery disease)     myoview 5/08:  EF 76%, no scar, no ischemia, +ECG changes with exercise;  cath 04/08/07:  pLAD 30%, mLAD 60-70% - med tx.  Marland Kitchen HLD (hyperlipidemia)   . Multiple system atrophy     [name]  . VAGINITIS, ATROPHIC 11/07/2007  . Osteoporosis 2016    DEXA T-4.4 spine deteriorated since 2012    Past Surgical History  Procedure Laterality Date  . Laparoscopic nissen fundoplication    . Cystectomy    . Tumor removal    . Tubal ligation    . Colonoscopy N/A 08/29/2013    int hemm; Inda Castle, MD  . Dexa  12/2014    T -4.4 spine  . Esophagogastroduodenoscopy N/A 01/09/2015    Procedure: ESOPHAGOGASTRODUODENOSCOPY (EGD);  Surgeon: Inda Castle, MD;  Location: [facility];  Service: Endoscopy;  Laterality: N/A;  help with transfers    There were no vitals filed for this visit.  Visit Diagnosis:  Abnormality of gait  Incoordination of extremity  Postural deformity      Subjective Assessment -  03/05/15 1429    Subjective Pt. reports she called Dr. Guadelupe Sabin office to move up appt. from June - scheduled now May 18; pt. concerned regarding progression of MSA with lateral lean to left and left hand going numb   Patient Stated Goals improve walking and mobility   Currently in Pain? No/denies                         [facility] Adult PT Treatment/Exercise - 03/05/15 0001    Transfers   Transfers Sit to Stand;Stand to Sit   Sit to Stand 4: Min assist;With upper extremity assist;From chair/3-in-1;Other/comment   Stand to Sit 4: Min guard   Ambulation/Gait   Ambulation/Gait Yes   Ambulation/Gait Assistance 4: Min guard;4: Min assist   Ambulation Distance (Feet) 350 Feet   Assistive device Rolling walker   Gait Pattern Step-to pattern;Lateral trunk lean to left   Ambulation Surface Level;Indoor   Posture/Postural Control   Posture/Postural Control Postural limitations   Posture Comments seated edge of mat worked on upright posture, rotational stretch to left and right with 60 second hold x 2 and propping on forearm with opposite arm overhead reach x 30 sec x 3   High Level Balance   High  Level Balance Activities Side stepping;Backward walking   High Level Balance Comments alternate tap ups to 4" step ; 3 taps each foot to 4" step with UE support      NeuroRe-ed:  BIG exercises - reaching down and up x 10 reps; seated rotation - with clap to opposite hand for big Trunk rotation movement x 10 reps toward each side            PT Short Term Goals - 12/17/14 1638    PT SHORT TERM GOAL #1   Title Pt. will increase standing tolerance so she is able to report standing 5" at home with S for independence with ADL's.   Baseline met 11-19-14   Time 4   Period Weeks   Status Achieved   PT SHORT TERM GOAL #3   Title Transfer sit to stand from wheelchair with CGA   Baseline met 11-19-14   Time 4   Period Weeks   Status Achieved   PT SHORT TERM GOAL #4   Title Independent  in HEP    Status Achieved           PT Long Term Goals - 02/01/15 0955    PT LONG TERM GOAL #2   Title Report ability to stand at least 10" at home for improved standing tolerance   Baseline target date 03-03-15   Time 4   Period Weeks   Status On-going   PT LONG TERM GOAL #3   Title Report at least 25% less pain in RLE with weight-bearing   Status Deferred   PT LONG TERM GOAL #4   Title Perform at least 15" on Nustep to demo improved activity tolerance/endurance   Baseline target date 03-03-15   Time 4   Period Weeks   Status New   PT LONG TERM GOAL #5   Title Amb. 76' with RW with CGA on flat even surface with less than 3 freezing episodes.   Baseline target date 03-03-15   Time 4   Period Weeks   Status New   PT LONG TERM GOAL #6   Title Improve TUG score to </= 28 secs. with RW with CGA.   Baseline 35.78 secs with RW      03-03-15 target date   Time 4   Period Weeks   Status On-going   PT LONG TERM GOAL #7   Title Transfer sit to stand 5 times with CGA for incr safety with transfers   Baseline min to mod assist 01-31-15      target date 03-03-15   Time 4   Period Weeks   Status New               Plan - 03/05/15 1434    Clinical Impression Statement Pt. has significant initiating movement when she becomes fatigued; does well with amb. with no freezing episodes until fatigue occurs and then has difficulty with initiation; progression of MSA disease process is limiting progress   Pt will benefit from skilled therapeutic intervention in order to improve on the following deficits Abnormal gait;Decreased coordination;Impaired flexibility;Difficulty walking;Decreased balance;Increased muscle spasms;Impaired tone;Decreased mobility;Decreased strength   Rehab Potential Fair   PT Frequency 2x / week   PT Duration 4 weeks   PT Treatment/Interventions ADLs/Self Care Home Management;Therapeutic activities;Patient/family education;Therapeutic exercise;Gait training;Balance  training;Stair training;Neuromuscular re-education;Functional mobility training   PT Next Visit Plan check goals - discharge at this time   PT Home Exercise Plan as instructed   Consulted and Agree with  Plan of Care Patient        Problem List Patient Active Problem List   Diagnosis Date Noted  . Dyspepsia 01/09/2015  . Constipation 12/14/2014  . Medicare annual wellness visit, subsequent 11/27/2014  . Advanced care planning/counseling discussion 11/27/2014  . Osteoporosis   . B-complex deficiency 06/07/2014  . Recurrent falls 02/06/2014  . Multiple system atrophy   . Dysphagia, unspecified(787.20) 07/28/2013  . Lower extremity edema 09/26/2012  . Shoulder pain, left 07/20/2012  . 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 Routine health maintenance 08/26/2011  . Murmur 05/27/2011  . HYPERCHOLESTEROLEMIA  IIA 05/14/2010  . VENOUS INSUFFICIENCY, LEGS 06/21/2009  . CORONARY HEART DISEASE 06/09/2008  . Hypothyroidism 05/15/2008  . ASTHMA 05/15/2008  . Depression 11/07/2007  . 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 GERD 11/07/2007  . VAGINITIS, ATROPHIC 11/07/2007    Alda Lea, PT 03/05/2015, 2:39 PM  [facility] [address] [place], [place], [address] Phone: [phone]   Fax:  [phone]

## 2015-03-05 NOTE — Telephone Encounter (Signed)
Her main concern is the sour taste in her mouth. She is on numerous medications for GI issues. She does not have indigestion or heartburn. She has diarrhea about once a week. She can correlate that to eating salad or certain other foods. Takes Levsin PRN. But she is concerned about the sour taste in her mouth. Overall she is better than before.

## 2015-03-07 ENCOUNTER — Ambulatory Visit: Payer: Medicare Other | Admitting: Physical Therapy

## 2015-03-07 DIAGNOSIS — E785 Hyperlipidemia, unspecified: Secondary | ICD-10-CM | POA: Diagnosis not present

## 2015-03-07 DIAGNOSIS — R278 Other lack of coordination: Secondary | ICD-10-CM | POA: Diagnosis not present

## 2015-03-07 DIAGNOSIS — R269 Unspecified abnormalities of gait and mobility: Secondary | ICD-10-CM | POA: Diagnosis not present

## 2015-03-07 DIAGNOSIS — I2510 Atherosclerotic heart disease of native coronary artery without angina pectoris: Secondary | ICD-10-CM | POA: Diagnosis not present

## 2015-03-07 DIAGNOSIS — M439 Deforming dorsopathy, unspecified: Secondary | ICD-10-CM

## 2015-03-07 DIAGNOSIS — K219 Gastro-esophageal reflux disease without esophagitis: Secondary | ICD-10-CM | POA: Diagnosis not present

## 2015-03-07 DIAGNOSIS — R29898 Other symptoms and signs involving the musculoskeletal system: Secondary | ICD-10-CM | POA: Diagnosis not present

## 2015-03-08 ENCOUNTER — Encounter: Payer: Self-pay | Admitting: Physical Therapy

## 2015-03-08 NOTE — Therapy (Signed)
[facility] [address] [place], [place], [address] Phone: [phone]   Fax:  [phone]  Physical Therapy Treatment  Patient Details  Name: [name] [name] MRN: [id number] Date of Birth: 06/08/1941 Referring Provider:  Ria Bush, MD  Encounter Date: 03/07/2015      PT End of Session - 03/08/15 0947    Visit Number 9  G9   Number of Visits 9   Date for PT Re-Evaluation 03/03/15   Authorization Type UHC [facility]   Authorization Time Period 01-31-15 - 04-01-15   PT Start Time 1536   PT Stop Time 1635   PT Time Calculation (min) 59 min   Equipment Utilized During Treatment Gait belt      Past Medical History  Diagnosis Date  . GERD (gastroesophageal reflux disease)     s/p nissen  . Familial tremor     followed by Dr. Erling Cruz  . Unspecified chronic bronchitis   . Irritable bladder   . History of pneumonia   . Hypothyroidism   . Depression   . Asthma   . CAD (coronary artery disease)     myoview 5/08:  EF 76%, no scar, no ischemia, +ECG changes with exercise;  cath 04/08/07:  pLAD 30%, mLAD 60-70% - med tx.  Marland Kitchen HLD (hyperlipidemia)   . Multiple system atrophy     [name]  . VAGINITIS, ATROPHIC 11/07/2007  . Osteoporosis 2016    DEXA T-4.4 spine deteriorated since 2012    Past Surgical History  Procedure Laterality Date  . Laparoscopic nissen fundoplication    . Cystectomy    . Tumor removal    . Tubal ligation    . Colonoscopy N/A 08/29/2013    int hemm; Inda Castle, MD  . Dexa  12/2014    T -4.4 spine  . Esophagogastroduodenoscopy N/A 01/09/2015    Procedure: ESOPHAGOGASTRODUODENOSCOPY (EGD);  Surgeon: Inda Castle, MD;  Location: [facility];  Service: Endoscopy;  Laterality: N/A;  help with transfers    There were no vitals filed for this visit.  Visit Diagnosis:  Abnormality of gait  Postural deformity  Incoordination of extremity      Subjective Assessment -  03/08/15 0940    Subjective Pt. reports increased tightness in neck on left side - may have slept on it wrong; felt better after stretching.  Pt. has appt with neurologist on 03-27-15; asks "How soon can I come back? I need my therapy"                             Patient Stated Goals improve walking and mobility   Currently in Pain? Yes   Pain Score 1    Pain Location Leg   Pain Orientation Right   Pain Descriptors / Indicators Aching;Throbbing   Pain Type Chronic pain   Pain Onset More than a month ago   Pain Frequency Intermittent   Aggravating Factors  weight-bearing aggravates   Pain Relieving Factors sitting alleviates                         [facility] Adult PT Treatment/Exercise - 03/08/15 0001    Transfers   Transfers Sit to Stand;Stand to Sit   Sit to Stand --  CGA to min assist   Sit to Stand Details (indicate cue type and reason) leans posteriorly   Ambulation/Gait   Ambulation/Gait Yes  Ambulation/Gait Assistance 4: Min guard;4: Min assist  mod assist to negotiate RW when becomes fatigued   Ambulation Distance (Feet) 480 Feet   Assistive device Rolling walker   Gait Pattern Step-to pattern;Lateral trunk lean to left   Ambulation Surface Level;Indoor   Exercises   Exercises Knee/Hip   Knee/Hip Exercises: Aerobic   Stationary Bike Nustep level 3 x 12" with UE's and LE's     NeuroRe-ed:  TUG score 31.25 secs with RW:  Sit to stand x 5 reps from hi/lo mat table to RW x 5 reps with CGA wheelchair to mat transfer with CGA - needs mod to min for sit to stand from wheelchair  BIG exercises; - trunk rotation x 5 reps each side;   TherEx:  Bil. Trunk stretching in seated position x 30 sec hold x 3 reps each side - leaning down on each forearm and Seated trunk rotation - both sides 3 reps 30 sec hold Mulligan's stretch with use of towel for L cervical musc. - 30 secs hold x 2 reps - instructed in stretch for home use         PT Education - 03/08/15 0947     Education provided Yes   Education Details instructed in Mulligan's stretch with use of towel for L cervical musc.   Person(s) Educated Patient   Methods Explanation;Demonstration   Comprehension Verbalized understanding;Returned demonstration          PT Short Term Goals - 12/17/14 1638    PT SHORT TERM GOAL #1   Title Pt. will increase standing tolerance so she is able to report standing 5" at home with S for independence with ADL's.   Baseline met 11-19-14   Time 4   Period Weeks   Status Achieved   PT SHORT TERM GOAL #3   Title Transfer sit to stand from wheelchair with CGA   Baseline met 11-19-14   Time 4   Period Weeks   Status Achieved   PT SHORT TERM GOAL #4   Title Independent in HEP    Status Achieved           PT Long Term Goals - 03/08/15 0948    PT LONG TERM GOAL #1   Title Pt. will ambulate 360' nonstop with RW to demo incr. endurance with CGA   Baseline met 12-13-14   Status Achieved   PT LONG TERM GOAL #2   Title Report ability to stand at least 10" at home for improved standing tolerance   Baseline reports about 5" at present time   03-07-15   Time 4   Period Weeks   Status Not Met   PT LONG TERM GOAL #3   Title Report at least 25% less pain in RLE with weight-bearing   Baseline not met as of 12-13-14   Status Deferred   PT LONG TERM GOAL #4   Title Perform at least 15" on Nustep to demo improved activity tolerance/endurance   Baseline 03-07-15 met   Status Achieved   PT LONG TERM GOAL #5   Title Amb. 480' with RW with CGA on flat even surface with less than 3 freezing episodes.   Baseline met with 2-3 freezing episodes occurring - episodes worse with fatigue     ---- 03-07-15   Time 4   Period Weeks   Status Achieved   PT LONG TERM GOAL #6   Title Improve TUG score to </= 28 secs. with RW with CGA.   Baseline 31.25  secs with RW - but pt. did not reach back for chair with stand to sit transfer   Time 4   Period Weeks   Status Achieved   PT  LONG TERM GOAL #7   Title Transfer sit to stand 5 times with CGA for incr safety with transfers   Baseline met 03-07-15   Status Achieved               Plan - 03/08/15 1006    Clinical Impression Statement Pt. has met 4/7 LTG's - performance fluctuates due to nature of disease process.  Pt. requests to be placed on hold until appt with neurologist on 03-27-15 rather than discharged at this time-- states that the therapy significantly helps with mobility, however, progress is limited due to progressive nature of disease.   Postural asymmetries have occurred within past month   Pt will benefit from skilled therapeutic intervention in order to improve on the following deficits Abnormal gait;Decreased coordination;Impaired flexibility;Difficulty walking;Decreased balance;Increased muscle spasms;Impaired tone;Decreased mobility;Decreased strength   Rehab Potential Fair   PT Treatment/Interventions ADLs/Self Care Home Management;Therapeutic activities;Patient/family education;Therapeutic exercise;Gait training;Balance training;Stair training;Neuromuscular re-education;Functional mobility training   PT Next Visit Plan will hold at this time per pt. request - another appt. is not scheduled  -- difficult to justify continuation of services due to limited progress; I have explained to pt that we are unable to do maintainence PT   PT Home Exercise Plan balance and stretching   Consulted and Agree with Plan of Care Patient        Problem List Patient Active Problem List   Diagnosis Date Noted  . Dyspepsia 01/09/2015  . Constipation 12/14/2014  . Medicare annual wellness visit, subsequent 11/27/2014  . Advanced care planning/counseling discussion 11/27/2014  . Osteoporosis   . B-complex deficiency 06/07/2014  . Recurrent falls 02/06/2014  . Multiple system atrophy   . Dysphagia, unspecified(787.20) 07/28/2013  . Lower extremity edema 09/26/2012  . Shoulder pain, left 07/20/2012  . Atypical  Parkinsonism 07/20/2012  . Routine health maintenance 08/26/2011  . Murmur 05/27/2011  . HYPERCHOLESTEROLEMIA  IIA 05/14/2010  . VENOUS INSUFFICIENCY, LEGS 06/21/2009  . CORONARY HEART DISEASE 06/09/2008  . Hypothyroidism 05/15/2008  . ASTHMA 05/15/2008  . Depression 11/07/2007  . 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 GERD 11/07/2007  . VAGINITIS, ATROPHIC 11/07/2007    Alda Lea, PT 03/08/2015, 10:15 AM  [facility] [address] [place], [place], [address] Phone: [phone]   Fax:  [phone]

## 2015-03-11 ENCOUNTER — Other Ambulatory Visit: Payer: Self-pay | Admitting: Gastroenterology

## 2015-03-12 ENCOUNTER — Telehealth: Payer: Self-pay | Admitting: Gastroenterology

## 2015-03-12 NOTE — Telephone Encounter (Signed)
She again has improved a little. Tums has helped. The taste in her mouth is better. She complains of indigestion that is briefly relieved by the [facility] but always comes back. Still has left sided pain that Levsin helps and passing gas helps. I am not clear on what she wants to do differently, but she does want you to know. She is on Protonix. Her bowels are moving normally.

## 2015-03-13 ENCOUNTER — Other Ambulatory Visit: Payer: Self-pay

## 2015-03-13 MED ORDER — DEXLANSOPRAZOLE 60 MG PO CPDR: 60.0000 mg | DELAYED_RELEASE_CAPSULE | Freq: Every day | ORAL | Status: DC

## 2015-03-13 NOTE — Telephone Encounter (Signed)
We can try switching her to dexilant 60 mg before breakfast

## 2015-03-13 NOTE — Telephone Encounter (Signed)
Patient agreeable to this plan. Dexilant 60 mg escribed to [facility].

## 2015-03-15 ENCOUNTER — Other Ambulatory Visit: Payer: Self-pay | Admitting: Cardiovascular Disease

## 2015-03-24 ENCOUNTER — Other Ambulatory Visit: Payer: Self-pay | Admitting: Cardiovascular Disease

## 2015-03-25 ENCOUNTER — Other Ambulatory Visit: Payer: Self-pay | Admitting: Gastroenterology

## 2015-03-25 ENCOUNTER — Other Ambulatory Visit: Payer: Self-pay

## 2015-03-27 ENCOUNTER — Encounter: Payer: Self-pay | Admitting: Neurology

## 2015-03-27 ENCOUNTER — Ambulatory Visit (INDEPENDENT_AMBULATORY_CARE_PROVIDER_SITE_OTHER): Payer: Medicare Other | Admitting: Neurology

## 2015-03-27 VITALS — BP 124/70 | HR 68 | Temp 97.1°F | Resp 16

## 2015-03-27 DIAGNOSIS — R269 Unspecified abnormalities of gait and mobility: Secondary | ICD-10-CM

## 2015-03-27 DIAGNOSIS — R202 Paresthesia of skin: Secondary | ICD-10-CM

## 2015-03-27 DIAGNOSIS — G20 Parkinson's disease: Secondary | ICD-10-CM | POA: Diagnosis not present

## 2015-03-27 DIAGNOSIS — R293 Abnormal posture: Secondary | ICD-10-CM

## 2015-03-27 DIAGNOSIS — E538 Deficiency of other specified B group vitamins: Secondary | ICD-10-CM | POA: Diagnosis not present

## 2015-03-27 DIAGNOSIS — R296 Repeated falls: Secondary | ICD-10-CM | POA: Diagnosis not present

## 2015-03-27 DIAGNOSIS — R200 Anesthesia of skin: Secondary | ICD-10-CM

## 2015-03-27 NOTE — Patient Instructions (Addendum)
We will restart PT.  We will do an MRI brain and call you with the results.  Your leg swelling is worse. Please try to see Dr. Danise Mina sooner than 06/10/15.

## 2015-03-27 NOTE — Progress Notes (Signed)
Subjective:    Patient ID: [name] [name] is a 74 y.o. female.  HPI     Interim history:   [name] [name] is a very pleasant 74year-old right-handed woman with an underlying complex medical history of thyroid disease, [place] spotted fever at age 1, asthma, cardiac disease, essential tremor, hearing loss, hyperlipidemia, reflux disease with surgical repair, who presents for followup consultation of her gait disorder, essential tremor, and recurrent falls, concern for MSA. She is accompanied by her husband again today. I last saw her on 12/11/2014, at which time she reported feeling about the same as far as her motor symptoms. She had more freezing, especially in her right leg. She was in physical therapy and felt it was helpful. She was trying to do stretching at home. Her husband reported that he does not feel comfortable leaving her at home alone. They were in touch with [facility] over the phone but there were no updates on research for multiple system atrophy. She has a call alert button. She had no recent falls. She was having trouble going to sleep. She was sleeping in her lift chair. She had new reading glasses. She had diarrhea in the past when she tried Linzess through her GI doctor. She requested a sooner than scheduled appointment due to abnormal posture and left-sided numbness.  Today, 03/27/2015: She is able to provide her history. Her husband provides details as well. She reports new L hand numbness for the past 1 month. She woke up and noticed L hand numbness and tingling. She was noted by her husband to lean to the L. Her last brain MRI was 07/29/12: No acute abnormality. Specifically, no brain stem or cerebellar stroke is noted.  I personally reviewed the images through the PACS system and agree with the findings.  She has no new slurring of speech or droopy face. Her lower extremity swelling is worse. She has an appointment with her primary care physician on 06/10/2015. After some  days her left hand numbness improved and now seems like it comes and goes. She has not noticed any new weakness. Her leaning is a little bit better. She is conscious about her leaning and is trying to adjust her position. She is using her wheelchair more and more. She finished physical therapy at the end of April. She is wondering if she can restarted as it helped her. I reviewed the physical therapy report from 03/08/2015 from [place].  Previously:   I saw her on 07/05/2014, at which time she reported having had more falls. She had not been seen at the [facility] clinic. Stem cell trial was discussed and another trial in [place] with alpha-synuclein depleting therapy at the last visit there. She was eating well and her spirits were good. We restarted outpatient physical therapy and she has been going to the neuro rehabilitation unit.    I saw her on 02/02/2014, at which time she reported that physical therapy was helpful. She had fallen a few times and bruised her ribs. She had a colonoscopy on 08/29/2013. She reported a history of hemorrhoids and chronic constipation for which she had tried Dulcolax. I referred her back to physical therapy. 4 chronic constipation I asked her to try Linzess. I did not suggest any other new medications. She goes to the [facility] once a year and had an appointment in on 04/27/14 and I reviewed the note and shared the data with them. She had previously tried and failed Sinemet.   I saw  her on 09/05/2013, at which time she presented for a sooner than scheduled appointment due to recurrent falls. She had fallen 6 times within about 1-1/2 weeks' time. 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 I advised her to use her walker at all times. We talked about potentially going to a motorized wheelchair down the [place]. She was advised to drink more water. She had tried and  failed Sinemet in the past. I suggested a second opinion at [facility] which they declined. I referred her to physical and occupational therapy outpatient. In the interim, I prescribed a light weight wheelchair for her. Her daughter recently died. She had advanced MS.   I first met her on 05/10/2013, at which time we discussed the possibility of MSA. I did not make any medication changes. We talked about the nature of progressive parkinsonism and gait disorder. She had her last outpatient PT in July 2014. She tried to do stretching at home. She had a colonoscopy.   She previously followed with Dr. Morene Antu and was last seen by him on 12/30/2012, at which time he felt that she had worsening of her gait. She also had symptoms of vertigo. She has been on baby aspirin, Synthroid, Crestor, and/or, clonazepam, propranolol, sertraline, trimethoprim, Nasonex, Provera, nitroglycerin as needed, vitamin B12.   She was diagnosed about 20 years ago with essential tremor. She had head tremor more than upper extremity tremor. She has a history of lung disease but was able to take low-dose propranolol. She has not tried primidone. She has had gait and balance problems in the last 5+ years approximately. She has to hold onto something. She has had bladder incontinence since her 31s. She was on Crestor for 3 years which was discontinued in June 2011. CK was normal and EMG nerve conduction studies were normal in June 2011. Crestor was restarted. She has been on B12 injections without subjective improvement. MRI brain with and without contrast showed hyperostosis frontalis, MRI C-spine showed mild degenerative joint disease. 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 Lumbar spine MRI from July 2012 showed mild degenerative disc disease. 4 gait dysfunction she was referred to Dr. Gilford Rile with a question of myelopathy versus orthostatic tremor versus lower half parkinsonism versus  essential tremor with gait disorder. 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 Further testing revealed negative tilt table test but there was mild autonomic neuropathy per autonomic reflex screen. She has had swelling in both legs with negative Doppler of the legs and normal echocardiogram. Additional workup at [facility] included vitamin D, paraneoplastic antibodies, anti-GAD, all negative.   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 She has been using a 2 wheeled walker for the past 2 years.   She has been in PT and OT with improvement.   She endorsed a lot of stress, due primarily to her mother's and daughter's health, who died in 02/10/14. Mother is in her 33s and in a NH. She sleeps in a recliner, d/t pain in her trunk, back, neck and stiffness and difficulty getting  in and out of bed.  Her Past Medical History Is Significant For: Past Medical History  Diagnosis Date  . GERD (gastroesophageal reflux disease)     s/p nissen  . Familial tremor     followed by Dr. Erling Cruz  . Unspecified chronic bronchitis   . Irritable bladder   . History of pneumonia   . Hypothyroidism   . Depression   . Asthma   . CAD (coronary artery disease)     myoview 5/08:  EF 76%, no scar, no ischemia, +ECG changes with exercise;  cath 04/08/07:  pLAD 30%, mLAD 60-70% - med tx.  Marland Kitchen HLD (hyperlipidemia)   . Multiple system atrophy     [name]  . VAGINITIS, ATROPHIC 11/07/2007  . Osteoporosis 2016    DEXA T-4.4 spine deteriorated since 2012    Her Past Surgical History Is Significant For: Past Surgical History  Procedure Laterality Date  . Laparoscopic nissen fundoplication    . Cystectomy    . Tumor removal    . Tubal ligation    .  Colonoscopy N/A 08/29/2013    int hemm; Inda Castle, MD  . Dexa  12/2014    T -4.4 spine  . Esophagogastroduodenoscopy N/A 01/09/2015    Procedure: ESOPHAGOGASTRODUODENOSCOPY (EGD);  Surgeon: Inda Castle, MD;  Location: [facility];  Service: Endoscopy;  Laterality: N/A;  help with transfers    Her Family History Is Significant For: Family History  Problem Relation Age of Onset  . Coronary artery disease    . Heart attack    . Hypertension    . Diabetes    . Breast cancer    . Hypertension Mother   . Heart disease Father     Her Social History Is Significant For: History   Social History  . Marital Status: Married    Spouse Name: [name]  . Number of Children: 2  . Years of Education: HS   Occupational History  .  Other   Social History Main Topics  . Smoking status: Never Smoker   . Smokeless tobacco: Never Used  . Alcohol Use: No  . Drug Use: No  . Sexual Activity: Not Currently   Other Topics Concern  . None   Social History Narrative   HSG, retired from [employer] . married '60. 1 son - '65; 1 daughter - '61; 2 grandchildren; 1 great-grandchild. Homemaker. marriage in good health. 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 Positive history of passive tobacco smoke exposure.   Caffeine Use: 1 cup daily; 2 glasses of tea daily    Her Allergies Are:  No Known Allergies:   Her Current Medications Are:  Outpatient Encounter Prescriptions as of 03/27/2015  Medication Sig  . AFLURIA PRESERVATIVE FREE injection   . albuterol (PROAIR HFA) 108 (90 BASE) MCG/ACT inhaler Inhale 2 puffs into the lungs every 6 (six) hours as needed for wheezing or shortness of breath.   Marland Kitchen alum & mag hydroxide-simeth (MYLANTA) 865-784-69 MG/5ML suspension Take 15 mLs by mouth every 6 (six) hours as needed for indigestion or heartburn.  Marland Kitchen aspirin (ASPIRIN EC) 81 MG EC tablet Take 81 mg by mouth 2 (two) times a week. Swallow whole.  . beclomethasone (QVAR) 80 MCG/ACT inhaler Inhale 1 puff into the  lungs daily as needed (wheezing).   . Calcium Carbonate-Vitamin D (CALCIUM-VITAMIN D) 500-200 MG-UNIT per tablet Take 1 tablet by mouth daily.    . cholecalciferol (VITAMIN D) 1000 UNITS tablet Take 1,000 Units by mouth daily.  Marland Kitchen  clonazePAM (KLONOPIN) 0.5 MG tablet TAKE 1-2 TABLETS BY MOUTH ONCE A DAY AS NEEDED  . CRESTOR 20 MG tablet TAKE ONE-HALF TABLET BY MOUTH ONCE EVERY EVENING  . cyanocobalamin (,VITAMIN B-12,) 1000 MCG/ML injection Inject 1,000 mcg into the muscle every 30 (thirty) days.    Marland Kitchen dexlansoprazole (DEXILANT) 60 MG capsule Take 1 capsule (60 mg total) by mouth daily.  . fluocinolone (VANOS) 0.01 % cream Apply 1 application topically 2 (two) times daily as needed (itching).   . furosemide (LASIX) 20 MG tablet Take 20 mg by mouth daily as needed for fluid or edema. For severe swelling with discomfort.  . hydrOXYzine (ATARAX/VISTARIL) 10 MG tablet Take 10 mg by mouth 3 (three) times daily as needed for itching.  . hyoscyamine (LEVSIN SL) 0.125 MG SL tablet PLACE 2 TABLETS UNDER THE TONGUE EVERY 4 (FOUR) HOURS AS NEEDED.  Marland Kitchen isosorbide mononitrate (IMDUR) 30 MG 24 hr tablet TAKE 1/2 TABLET BY MOUTH DAILY  . levothyroxine (SYNTHROID, LEVOTHROID) 112 MCG tablet Take 112 mcg by mouth daily before breakfast.  . loratadine (CLARITIN) 10 MG tablet Take 10 mg by mouth 2 (two) times daily as needed.   . mometasone (NASONEX) 50 MCG/ACT nasal spray Place 2 sprays into the nose daily as needed (allergies).   . nitroGLYCERIN (NITROSTAT) 0.4 MG SL tablet Place 0.4 mg under the tongue every 5 (five) minutes as needed.    . propranolol (INDERAL) 10 MG tablet Take 1 tablet (10 mg total) by mouth 3 (three) times daily as needed (Tremors).  . sertraline (ZOLOFT) 25 MG tablet TAKE 1 TABLET (25 MG TOTAL) BY MOUTH EVERY MORNING.  . [DISCONTINUED] famotidine (PEPCID) 40 MG tablet TAKE 1 TABLET (40 MG TOTAL) BY MOUTH 2 (TWO) TIMES DAILY AS NEEDED FOR HEARTBURN.   No facility-administered encounter  medications on file as of 03/27/2015.  :  Review of Systems:  Out of a complete 14 point review of systems, all are reviewed and negative with the exception of these symptoms as listed below:   Review of Systems  Neurological: Positive for numbness.       New numbness in L hand, patient feels like she is leaning to the left when walking    Objective:  Neurologic Exam  Physical Exam Physical Examination:   Filed Vitals:   03/27/15 1535  BP: 124/70  Pulse: 68  Temp: 97.1 F (36.2 C)  Resp: 16   General Examination: The patient is a very pleasant 74 y.o. female in no acute distress. She appears well-developed and well-nourished and well groomed. She is in good spirits today. She is situated in her wheelchair.  HEENT: Normocephalic, atraumatic, pupils are equal, round and reactive to light and accommodation. Extraocular tracking shows mild limitation to upper gaze. She has saccadic breakdown of smooth pursuit but no nystagmus. Hearing is mildly impaired. She has mild facial masking and normal facial sensation. Speech shows mild to moderate hypophonia with mild dysarthria and mild voice tremor. Neck shows mild to moderate rigidity.  There is a mild head, no-no type tremor. There are no carotid bruits on auscultation. Head is tilted to the L and she tends to lean a little bit to the left in her wheelchair, slightly worse from before. Oropharynx exam reveals: mild to moderate mouth dryness, adequate dental hygiene and mild airway crowding, due to narrow airway. Mallampati is class II.   Chest: Clear to auscultation without wheezing, rhonchi or crackles noted.  Heart: S1+S2+0, regular and normal without murmurs, rubs or gallops  noted.   Abdomen: Soft, non-tender and non-distended with normal bowel sounds appreciated on auscultation.  Extremities: There is 2+ pitting edema in the distal lower extremities bilaterally, worse from before.   Skin: Warm and dry without trophic changes noted.  She has some smaller bruises on her hands.   Musculoskeletal: exam reveals no obvious joint deformities, tenderness or joint swelling or erythema.   Neurologically:  Mental status: The patient is awake, alert and oriented in all 4 spheres. Her memory, attention, language and knowledge are fairly well preserved. There is no aphasia, agnosia, apraxia or anomia. Speech is mildly tremulous and slightly dysarthric with normal prosody and enunciation. Thought process is linear. Mood is congruent and affect is normal.  Cranial nerves are as described above under HEENT exam. In addition, shoulder shrug is normal with equal shoulder height noted. Motor exam: she has thin bulk, 4/5 global strength and tone is increased throughout, moderately, R>L. There is no drift, no resting tremor or rebound. Grip strength is a little bit worse on the right if anything. She has a mild action tremor bilaterally as well as a mild postural tremor, right worse than left. Reflexes are 2+ in the UEs and 3+ in the LEs. Fine motor skills are moderately impaired in the UEs and severely impaired in the LEs, with no significant lateralization, perhaps a little worse on the R, unchanged from before.  Cerebellar testing shows no dysmetria or intention tremor on finger to nose testing. There is no truncal or gait ataxia.  Sensory exam is intact to light touch, pinprick, and temperature sense in both upper and lower extremities as well as intact vibration sense.  Gait, station and balance: We tried to stand her. She needs maximum assistance from her husband and myself. She is unable to walk more than a couple of steps. She has difficulty with her right foot, she has a hard time lifting it.   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 She has progressed with time.  She reports new onset left hand numbness and tingling which is currently intermittent. She has no significant decrease in sensation at this time. Strength is globally 4 out of 5. Grip strength is actually worse on the right. She has been leaning more to the left. I noticed this last time but it is a little bit worse than last time. Her lower extremity swelling is worse. She is advised to start using compression stockings to the lower extremities. We can restart physical therapy. I would like to proceed with a brain MRI without contrast and compared findings from 2013. She is advised to try to move up her follow-up appointment with her primary care physician because of lower extremity swelling which has worsened. I again talked to the patient and her husband about her atypical parkinsonism. She has progressed with time. She is barely able to use her walker even with assistance at this time. She has fallen repeatedly in the past but not recently thankfully. She is primarily confined to her wheelchair and sleeps in her lift chair. I can request to restart physical therapy as an outpatient. She felt it helped her. She had limited progress though when I reviewed the physical therapy report. She continues to try to exercise as much she can at home. She cannot maneuver the walker by herself.  She  is taking clonazepam 0.5 mg during the day which helps tone down her tremors.  I will see her back in about 4 months, sooner if needed. We will call her with her MRI results. I answered all their questions today and she and her husband were in agreement.  She received a B12 shot today. This was given to her by [name] Parody, RN before patient left today.

## 2015-03-28 MED ORDER — CYANOCOBALAMIN 1000 MCG/ML IJ SOLN
1000.0000 ug | Freq: Once | INTRAMUSCULAR | Status: AC
  Administered 2015-03-27: 1000 ug via INTRAMUSCULAR

## 2015-03-28 NOTE — Addendum Note (Signed)
Addended by: Laurence Spates on: 03/28/2015 08:34 AM   Modules accepted: Orders

## 2015-03-29 ENCOUNTER — Telehealth: Payer: Self-pay | Admitting: Family Medicine

## 2015-03-29 NOTE — Telephone Encounter (Signed)
Order written for Boniva in your IN box. I've never done an approval for Prolia on this patient, but she's previously had Boniva infusions (last one 3 months ago).

## 2015-03-29 NOTE — Telephone Encounter (Signed)
Thank you - yes that's right boniva. Signed and in Kim's box.

## 2015-03-29 NOTE — Telephone Encounter (Signed)
Short stay called - needs updated order for prolia sent in for next week's appointment.  plz send in updated prolia short stay order set.

## 2015-04-01 ENCOUNTER — Encounter: Payer: Self-pay | Admitting: Family Medicine

## 2015-04-01 ENCOUNTER — Ambulatory Visit (INDEPENDENT_AMBULATORY_CARE_PROVIDER_SITE_OTHER): Payer: Medicare Other | Admitting: Family Medicine

## 2015-04-01 VITALS — BP 108/70 | HR 66 | Temp 97.9°F

## 2015-04-01 DIAGNOSIS — E039 Hypothyroidism, unspecified: Secondary | ICD-10-CM

## 2015-04-01 DIAGNOSIS — R600 Localized edema: Secondary | ICD-10-CM | POA: Diagnosis not present

## 2015-04-01 LAB — T4, FREE: Free T4: 1.26 ng/dL (ref 0.60–1.60)

## 2015-04-01 LAB — BASIC METABOLIC PANEL
BUN: 14 mg/dL (ref 6–23)
CALCIUM: 9.2 mg/dL (ref 8.4–10.5)
CO2: 28 mEq/L (ref 19–32)
CREATININE: 0.66 mg/dL (ref 0.40–1.20)
Chloride: 106 mEq/L (ref 96–112)
GFR: 92.82 mL/min (ref >60.00)
GLUCOSE: 99 mg/dL (ref 70–99)
Potassium: 3.9 mEq/L (ref 3.5–5.1)
Sodium: 139 mEq/L (ref 135–145)

## 2015-04-01 LAB — TSH: TSH: 0.88 u[IU]/mL (ref 0.35–4.50)

## 2015-04-01 NOTE — Telephone Encounter (Signed)
Order faxed to Short Stay.

## 2015-04-01 NOTE — Patient Instructions (Addendum)
I think this is from chronic venous insufficiency - treat with lasix 45m daily for 3 days. Continue elevating legs, avoiding salt and sodium in the diet, and stay well hydrated with plenty of water. Continue using compression socks. This should help. Thyroid check today.  Venous Stasis or Chronic Venous Insufficiency Chronic venous insufficiency, also called venous stasis, is a condition that affects the veins in the legs. The condition prevents blood from being pumped through these veins effectively. Blood may no longer be pumped effectively from the legs back to the heart. This condition can range from mild to severe. With proper treatment, you should be able to continue with an active life. CAUSES  Chronic venous insufficiency occurs when the vein walls become stretched, weakened, or damaged or when valves within the vein are damaged. Some common causes of this include:  High blood pressure inside the veins (venous hypertension).  Increased blood pressure in the leg veins from long periods of sitting or standing.  A blood clot that blocks blood flow in a vein (deep vein thrombosis).  Inflammation of a superficial vein (phlebitis) that causes a blood clot to form. RISK FACTORS Various things can make you more likely to develop chronic venous insufficiency, including:  Family history of this condition.  Obesity.  Pregnancy.  Sedentary lifestyle.  Smoking.  Jobs requiring long periods of standing or sitting in one place.  Being a certain age. Women in their 422sand 544sand men in their 719sare more likely to develop this condition. SIGNS AND SYMPTOMS  Symptoms may include:   Varicose veins.  Skin breakdown or ulcers.  Reddened or discolored skin on the leg.  Brown, smooth, tight, and painful skin just above the ankle, usually on the inside surface (lipodermatosclerosis).  Swelling. DIAGNOSIS  To diagnose this condition, your health care provider will take a medical  history and do a physical exam. The following tests may be ordered to confirm the diagnosis:  Duplex ultrasound--A procedure that produces a picture of a blood vessel and nearby organs and also provides information on blood flow through the blood vessel.  Plethysmography--A procedure that tests blood flow.  A venogram, or venography--A procedure used to look at the veins using X-ray and dye. TREATMENT The goals of treatment are to help you return to an active life and to minimize pain or disability. Treatment will depend on the severity of the condition. Medical procedures may be needed for severe cases. Treatment options may include:   Use of compression stockings. These can help with symptoms and lower the chances of the problem getting worse, but they do not cure the problem.  Sclerotherapy--A procedure involving an injection of a material that "dissolves" the damaged veins. Other veins in the network of blood vessels take over the function of the damaged veins.  Surgery to remove the vein or cut off blood flow through the vein (vein stripping or laser ablation surgery).  Surgery to repair a valve. HOME CARE INSTRUCTIONS   Wear compression stockings as directed by your health care provider.  Only take over-the-counter or prescription medicines for pain, discomfort, or fever as directed by your health care provider.  Follow up with your health care provider as directed. SEEK MEDICAL CARE IF:   You have redness, swelling, or increasing pain in the affected area.  You see a red streak or line that extends up or down from the affected area.  You have a breakdown or loss of skin in the affected area, even  if the breakdown is small.  You have an injury to the affected area. SEEK IMMEDIATE MEDICAL CARE IF:   You have an injury and open wound in the affected area.  Your pain is severe and does not improve with medicine.  You have sudden numbness or weakness in the foot or ankle  below the affected area, or you have trouble moving your foot or ankle.  You have a fever or persistent symptoms for more than 2-3 days.  You have a fever and your symptoms suddenly get worse. MAKE SURE YOU:   Understand these instructions.  Will watch your condition.  Will get help right away if you are not doing well or get worse. Document Released: 03/01/2007 Document Revised: 08/16/2013 Document Reviewed: 07/03/2013 [facility] Patient Information 2015 [place], [place]. This information is not intended to replace advice given to you by your health care provider. Make sure you discuss any questions you have with your health care provider.

## 2015-04-01 NOTE — Progress Notes (Signed)
Pre visit review using our clinic review tool, if applicable. No additional management support is needed unless otherwise documented below in the visit note. 

## 2015-04-01 NOTE — Assessment & Plan Note (Signed)
Overdue for recheck after thyroid dose titrated 11/2014 - recheck today

## 2015-04-01 NOTE — Assessment & Plan Note (Signed)
Anticipate from [facility] - heart and lungs stable on exam today. rec take lasix daily for 3 days and discussed supportive care in form of elevation of legs, staying well hydrated with water, continued compression sock use.  Update if not improved with this. Doubt MSA related.

## 2015-04-01 NOTE — Progress Notes (Signed)
BP 108/70 mmHg  Pulse 66  Temp(Src) 97.9 F (36.6 C) (Oral)  SpO2 96%   CC: leg/foot swelling  Subjective:    Patient ID: [name] [name], female    DOB: [birth year], 74 y.o.   MRN: [id number]  HPI: [name] [name] is a 74 y.o. 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 Saw neurologist Dr Rexene Alberts who recommended evaluation today 2/2 swelling. She has ordered brain scan as well for L hand numbness/paresthesia and leftward leaning. 1 mo h/o bilateral leg and foot swelling.  Swelling goes up to knees.  Denies dyspnea, coughing/wheezing, chest pain/tightness.  She is not on any medication that should cause pedal edema.   H/o intermittent swelling for last several years.  Doesn't like taking lasix 2/2 overactive bladder.  Feels weight has increased. Unable to check weight today.  Relevant past medical, surgical, family and social history reviewed and updated as indicated. Interim medical history since our last visit reviewed. Allergies and medications reviewed and updated. Current Outpatient Prescriptions on File Prior to Visit  Medication Sig  . AFLURIA PRESERVATIVE FREE injection   . albuterol (PROAIR HFA) 108 (90 BASE) MCG/ACT inhaler Inhale 2 puffs into the lungs every 6 (six) hours as needed for wheezing or shortness of breath.   Marland Kitchen alum & mag hydroxide-simeth (MYLANTA) 782-956-21 MG/5ML suspension Take 15 mLs by mouth every 6 (six) hours as needed for indigestion or heartburn.  Marland Kitchen aspirin (ASPIRIN EC) 81 MG EC tablet Take 81 mg by mouth 2 (two) times a week. Swallow whole.  . beclomethasone (QVAR) 80 MCG/ACT inhaler Inhale 1 puff into the lungs daily as needed (wheezing).   . Calcium Carbonate-Vitamin D (CALCIUM-VITAMIN D) 500-200 MG-UNIT per tablet Take 1 tablet by mouth daily.    . cholecalciferol (VITAMIN D) 1000 UNITS tablet Take 1,000 Units by mouth daily.  . clonazePAM (KLONOPIN) 0.5  MG tablet TAKE 1-2 TABLETS BY MOUTH ONCE A DAY AS NEEDED  . CRESTOR 20 MG tablet TAKE ONE-HALF TABLET BY MOUTH ONCE EVERY EVENING  . cyanocobalamin (,VITAMIN B-12,) 1000 MCG/ML injection Inject 1,000 mcg into the muscle every 30 (thirty) days.    Marland Kitchen dexlansoprazole (DEXILANT) 60 MG capsule Take 1 capsule (60 mg total) by mouth daily.  . fluocinolone (VANOS) 0.01 % cream Apply 1 application topically 2 (two) times daily as needed (itching).   . furosemide (LASIX) 20 MG tablet Take 20 mg by mouth daily as needed for fluid or edema. For severe swelling with discomfort.  . hydrOXYzine (ATARAX/VISTARIL) 10 MG tablet Take 10 mg by mouth 3 (three) times daily as needed for itching.  . hyoscyamine (LEVSIN SL) 0.125 MG SL tablet PLACE 2 TABLETS UNDER THE TONGUE EVERY 4 (FOUR) HOURS AS NEEDED.  Marland Kitchen ibandronate (BONIVA) 3 MG/3ML SOLN injection Inject 3 mg into the vein once. Infuse every 3 months  . isosorbide mononitrate (IMDUR) 30 MG 24 hr tablet TAKE 1/2 TABLET BY MOUTH DAILY  . levothyroxine (SYNTHROID, LEVOTHROID) 112 MCG tablet Take 112 mcg by mouth daily before breakfast.  . loratadine (CLARITIN) 10 MG tablet Take 10 mg by mouth 2 (two) times daily as needed.   . mometasone (NASONEX) 50 MCG/ACT nasal spray Place 2 sprays into the nose daily as needed (allergies).   . nitroGLYCERIN (NITROSTAT) 0.4 MG SL tablet Place 0.4 mg under the tongue every 5 (five) minutes as needed.    . propranolol (INDERAL) 10  MG tablet Take 1 tablet (10 mg total) by mouth 3 (three) times daily as needed (Tremors).  . sertraline (ZOLOFT) 25 MG tablet TAKE 1 TABLET (25 MG TOTAL) BY MOUTH EVERY MORNING.   No current facility-administered medications on file prior to visit.    Review of Systems Per HPI unless specifically indicated above     Objective:    BP 108/70 mmHg  Pulse 66  Temp(Src) 97.9 F (36.6 C) (Oral)  SpO2 96%  Wt Readings from Last 3 Encounters:  01/11/15 150 lb (68.04 kg)  01/09/15 151 lb (68.493 kg)    12/11/14 151 lb (68.493 kg)    Physical Exam  Constitutional: She appears well-developed and well-nourished. No distress.  Cardiovascular: Normal rate, regular rhythm, normal heart sounds and intact distal pulses.   No murmur heard. Pulmonary/Chest: Effort normal and breath sounds normal. No respiratory distress. She has no wheezes. She has no rales.  Musculoskeletal: She exhibits edema (1+ pitting BLE up to just past knees).  Skin: Skin is warm and dry. No rash noted.  Small abrasion without surrounding erythema R lateral lower leg  Psychiatric: She has a normal mood and affect.  Nursing note and vitals reviewed.  Results for orders placed or performed in visit on 11/20/14  Lipid panel  Result Value Ref Range   Cholesterol 130 0 - 200 mg/dL   Triglycerides 115.0 0.0 - 149.0 mg/dL   HDL 43.90 >39.00 mg/dL   VLDL 23.0 0.0 - 40.0 mg/dL   LDL Cholesterol 63 0 - 99 mg/dL   Total CHOL/HDL Ratio 3    NonHDL 86.10   Comprehensive metabolic panel  Result Value Ref Range   Sodium 136 135 - 145 mEq/L   Potassium 3.9 3.5 - 5.1 mEq/L   Chloride 105 96 - 112 mEq/L   CO2 29 19 - 32 mEq/L   Glucose, Bld 112 (H) 70 - 99 mg/dL   BUN 18 6 - 23 mg/dL   Creatinine, Ser 0.8 0.4 - 1.2 mg/dL   Total Bilirubin 0.8 0.2 - 1.2 mg/dL   Alkaline Phosphatase 113 39 - 117 U/L   AST 20 0 - 37 U/L   ALT 18 0 - 35 U/L   Total Protein 6.6 6.0 - 8.3 g/dL   Albumin 3.9 3.5 - 5.2 g/dL   Calcium 8.9 8.4 - 10.5 mg/dL   GFR 76.62 >60.00 mL/min  TSH  Result Value Ref Range   TSH 8.76 (H) 0.35 - 4.50 uIU/mL      Assessment & Plan:   Problem List Items Addressed This Visit    Hypothyroidism    Overdue for recheck after thyroid dose titrated 11/2014 - recheck today      Relevant Orders   TSH   T4, free   Pedal edema - Primary    Anticipate from CVI - heart and lungs stable on exam today. rec take lasix daily for 3 days and discussed supportive care in form of elevation of legs, staying well hydrated  with water, continued compression sock use.  Update if not improved with this. Doubt MSA related.      Relevant Orders   Basic metabolic panel       Follow up plan: Return if symptoms worsen or fail to improve.

## 2015-04-02 ENCOUNTER — Other Ambulatory Visit (HOSPITAL_COMMUNITY): Payer: Self-pay | Admitting: *Deleted

## 2015-04-03 ENCOUNTER — Ambulatory Visit (HOSPITAL_COMMUNITY)
Admission: RE | Admit: 2015-04-03 | Discharge: 2015-04-03 | Disposition: A | Discharge status: Home / Self Care | Payer: Medicare Other | Source: Ambulatory Visit | Attending: Family Medicine | Admitting: Family Medicine

## 2015-04-03 DIAGNOSIS — M810 Age-related osteoporosis without current pathological fracture: Secondary | ICD-10-CM | POA: Insufficient documentation

## 2015-04-03 DIAGNOSIS — K219 Gastro-esophageal reflux disease without esophagitis: Secondary | ICD-10-CM | POA: Insufficient documentation

## 2015-04-03 MED ORDER — SODIUM CHLORIDE 0.9 % IV SOLN: INTRAVENOUS | Status: DC

## 2015-04-03 MED ORDER — IBANDRONATE SODIUM 3 MG/3ML IV SOLN
INTRAVENOUS | Status: DC
Start: 2015-04-03 — End: 2015-04-04
  Filled 2015-04-03: qty 3

## 2015-04-03 MED ORDER — IBANDRONATE SODIUM 3 MG/3ML IV SOLN
3.0000 mg | Freq: Once | INTRAVENOUS | Status: AC
  Administered 2015-04-03: 3 mg via INTRAVENOUS

## 2015-04-03 NOTE — Discharge Instructions (Signed)
Ibandronate injection What is this medicine? IBANDRONATE (i BAN droh nate) slows calcium loss from bones. It is used to treat osteoporosis in women past the age of menopause. This medicine may be used for other purposes; ask your health care provider or pharmacist if you have questions. COMMON BRAND NAME(S): Boniva What should I tell my health care provider before I take this medicine? They need to know if you have any of these conditions: -dental disease -kidney disease -low levels of calcium in the blood -low levels of vitamin D in the blood -an unusual or allergic reaction to ibandronate, other medicines, foods, dyes, or preservatives -pregnant or trying to get pregnant -breast-feeding How should I use this medicine? This medicine is for injection into a vein. It is given by a health care professional in a hospital or clinic setting. Talk to your pediatrician regarding the use of this medicine in children. Special care may be needed. Overdosage: If you think you have taken too much of this medicine contact a poison control center or emergency room at once. NOTE: This medicine is only for you. Do not share this medicine with others. What if I miss a dose? It is important not to miss your dose. Call your doctor or health care professional if you are unable to keep an appointment. What may interact with this medicine? -teriparatide This list may not describe all possible interactions. Give your health care provider a list of all the medicines, herbs, non-prescription drugs, or dietary supplements you use. Also tell them if you smoke, drink alcohol, or use illegal drugs. Some items may interact with your medicine. What should I watch for while using this medicine? Visit your doctor or health care professional for regular check ups. It may be some time before you see the benefit from this medicine. Do not stop taking your medicine except on your doctor's advice. Your doctor or health care  professional may order blood tests and other tests to see how you are doing. You should make sure you get enough calcium and vitamin D while you are taking this medicine, unless your doctor tells you not to. Discuss the foods you eat and the vitamins you take with your health care professional. Some people who take this medicine have severe bone, joint, and/or muscle pain. This medicine may also increase your risk for a broken thigh bone. Tell your doctor right away if you have pain in your upper leg or groin. Tell your doctor if you have any pain that does not go away or that gets worse. What side effects may I notice from receiving this medicine? Side effects that you should report to your doctor or health care professional as soon as possible: -allergic reactions such as skin rash or itching, hives, swelling of the face, lips, throat, or tongue -changes in vision -chest pain -fever, flu-like symptoms -heartburn or stomach pain -jaw pain, especially after dental work Side effects that usually do not require medical attention (report to your doctor or health care professional if they continue or are bothersome): -bone, muscle or joint pain -diarrhea or constipation -eye pain or itching -headache -irritation at site where injected -nausea This list may not describe all possible side effects. Call your doctor for medical advice about side effects. You may report side effects to FDA at 1-800-FDA-1088. Where should I keep my medicine? This drug is given in a hospital or clinic and will not be stored at home. NOTE: This sheet is a summary. It may not  cover all possible information. If you have questions about this medicine, talk to your doctor, pharmacist, or health care provider.  2015, [employer]. (2011-04-24 09:02:20)

## 2015-04-04 ENCOUNTER — Telehealth: Payer: Self-pay | Admitting: *Deleted

## 2015-04-04 MED ORDER — FUROSEMIDE 20 MG PO TABS: 20.0000 mg | ORAL_TABLET | Freq: Every day | ORAL | Status: DC | PRN

## 2015-04-04 NOTE — Telephone Encounter (Signed)
Pt called and left voicemail at Triage. She saw Dr. Darnell Level for swelling in legs on 04/01/15 and she said he told her he was going [facility], [facility] and [facility]'t [facility]. Pt wanted to know if Dr. Darnell Level has sent in Rx and also she wanted to check the status of her lab results she hasn't received a call about her results. Pt request call back

## 2015-04-04 NOTE — Telephone Encounter (Signed)
Pt states that she did look for Lasix at home, the bottle she had found was out of date, needs new Rx sent to pharmacy. Notified pt of lab results.

## 2015-04-04 NOTE — Telephone Encounter (Signed)
New dose sent to pharmacy.

## 2015-04-04 NOTE — Telephone Encounter (Signed)
See lab results note. I wanted her to take scheduled lasix 51m daily for 3 days and thought she had this at home. Let me know if she needs lasix refill.

## 2015-04-10 ENCOUNTER — Telehealth: Payer: Self-pay | Admitting: Family Medicine

## 2015-04-10 NOTE — Telephone Encounter (Signed)
Pt called, needs an ok for hospital to give boniva injection.  Pt was told to have one done every 3 mos, and pt nees ok before able to schedule the next one. Please call pt back at home number, thanks.

## 2015-04-11 ENCOUNTER — Other Ambulatory Visit: Payer: Self-pay | Admitting: Gastroenterology

## 2015-04-11 ENCOUNTER — Encounter (HOSPITAL_COMMUNITY): Payer: Self-pay | Admitting: Emergency Medicine

## 2015-04-11 ENCOUNTER — Emergency Department (HOSPITAL_COMMUNITY)
Admission: EM | Admit: 2015-04-11 | Discharge: 2015-04-11 | Disposition: A | Discharge status: Home / Self Care | Payer: Medicare Other | Attending: Emergency Medicine | Admitting: Emergency Medicine

## 2015-04-11 ENCOUNTER — Ambulatory Visit: Payer: Medicare Other | Admitting: Neurology

## 2015-04-11 ENCOUNTER — Telehealth: Payer: Self-pay

## 2015-04-11 DIAGNOSIS — Z8701 Personal history of pneumonia (recurrent): Secondary | ICD-10-CM | POA: Diagnosis not present

## 2015-04-11 DIAGNOSIS — J45909 Unspecified asthma, uncomplicated: Secondary | ICD-10-CM | POA: Insufficient documentation

## 2015-04-11 DIAGNOSIS — E785 Hyperlipidemia, unspecified: Secondary | ICD-10-CM | POA: Insufficient documentation

## 2015-04-11 DIAGNOSIS — I2510 Atherosclerotic heart disease of native coronary artery without angina pectoris: Secondary | ICD-10-CM | POA: Diagnosis not present

## 2015-04-11 DIAGNOSIS — Z7952 Long term (current) use of systemic steroids: Secondary | ICD-10-CM | POA: Diagnosis not present

## 2015-04-11 DIAGNOSIS — K219 Gastro-esophageal reflux disease without esophagitis: Secondary | ICD-10-CM | POA: Insufficient documentation

## 2015-04-11 DIAGNOSIS — R358 Other polyuria: Secondary | ICD-10-CM | POA: Diagnosis present

## 2015-04-11 DIAGNOSIS — N390 Urinary tract infection, site not specified: Secondary | ICD-10-CM | POA: Insufficient documentation

## 2015-04-11 DIAGNOSIS — Z79899 Other long term (current) drug therapy: Secondary | ICD-10-CM | POA: Insufficient documentation

## 2015-04-11 DIAGNOSIS — F329 Major depressive disorder, single episode, unspecified: Secondary | ICD-10-CM | POA: Diagnosis not present

## 2015-04-11 DIAGNOSIS — Z8739 Personal history of other diseases of the musculoskeletal system and connective tissue: Secondary | ICD-10-CM | POA: Insufficient documentation

## 2015-04-11 DIAGNOSIS — E039 Hypothyroidism, unspecified: Secondary | ICD-10-CM | POA: Diagnosis not present

## 2015-04-11 LAB — CBC WITH DIFFERENTIAL/PLATELET
BASOS PCT: 0 % (ref 0–1)
Basophils Absolute: 0 10*3/uL (ref 0.0–0.1)
EOS ABS: 0.1 10*3/uL (ref 0.0–0.7)
Eosinophils Relative: 1 % (ref 0–5)
HCT: 40 % (ref 36.0–46.0)
HEMOGLOBIN: 13.3 g/dL (ref 12.0–15.0)
LYMPHS ABS: 2.1 10*3/uL (ref 0.7–4.0)
Lymphocytes Relative: 19 % (ref 12–46)
MCH: 28.4 pg (ref 26.0–34.0)
MCHC: 33.3 g/dL (ref 30.0–36.0)
MCV: 85.3 fL (ref 78.0–100.0)
MONO ABS: 1.1 10*3/uL — AB (ref 0.1–1.0)
Monocytes Relative: 9 % (ref 3–12)
Neutro Abs: 7.9 10*3/uL — ABNORMAL HIGH (ref 1.7–7.7)
Neutrophils Relative %: 71 % (ref 43–77)
PLATELETS: 267 10*3/uL (ref 150–400)
RBC: 4.69 MIL/uL (ref 3.87–5.11)
RDW: 13.5 % (ref 11.5–15.5)
WBC: 11.1 10*3/uL — AB (ref 4.0–10.5)

## 2015-04-11 LAB — BASIC METABOLIC PANEL
ANION GAP: 9 (ref 5–15)
BUN: 9 mg/dL (ref 6–20)
CO2: 26 mmol/L (ref 22–32)
Calcium: 8.9 mg/dL (ref 8.9–10.3)
Chloride: 102 mmol/L (ref 101–111)
Creatinine, Ser: 0.76 mg/dL (ref 0.44–1.00)
GFR calc non Af Amer: >60 mL/min (ref >60)
Glucose, Bld: 128 mg/dL — ABNORMAL HIGH (ref 65–99)
POTASSIUM: 3.9 mmol/L (ref 3.5–5.1)
SODIUM: 137 mmol/L (ref 135–145)

## 2015-04-11 LAB — URINE MICROSCOPIC-ADD ON

## 2015-04-11 LAB — URINALYSIS, ROUTINE W REFLEX MICROSCOPIC
Bilirubin Urine: NEGATIVE
Glucose, UA: NEGATIVE mg/dL
KETONES UR: NEGATIVE mg/dL
NITRITE: NEGATIVE
PH: 7.5 (ref 5.0–8.0)
Protein, ur: 30 mg/dL — AB
SPECIFIC GRAVITY, URINE: 1.008 (ref 1.005–1.030)
UROBILINOGEN UA: 0.2 mg/dL (ref 0.0–1.0)

## 2015-04-11 MED ORDER — CEPHALEXIN 500 MG PO CAPS: 500.0000 mg | ORAL_CAPSULE | Freq: Two times a day (BID) | ORAL | Status: DC

## 2015-04-11 MED ORDER — CEPHALEXIN 250 MG PO CAPS
500.0000 mg | ORAL_CAPSULE | Freq: Two times a day (BID) | ORAL | Status: DC
  Administered 2015-04-11: 500 mg via ORAL
  Filled 2015-04-11: qty 2

## 2015-04-11 NOTE — Telephone Encounter (Signed)
Appears from encounters in chart review pt is at [facility] ED.

## 2015-04-11 NOTE — Telephone Encounter (Signed)
Seen at ER with UTI placed on keflex. plz call for update today or tomorrow.

## 2015-04-11 NOTE — Telephone Encounter (Signed)
PLEASE NOTE: All timestamps contained within this report are represented as [place] Standard Time. CONFIDENTIALTY NOTICE: This fax transmission is intended only for the addressee. It contains information that is legally privileged, confidential or otherwise protected from use or disclosure. If you are not the intended recipient, you are strictly prohibited from reviewing, disclosing, copying using or disseminating any of this information or taking any action in reliance on or regarding this information. If you have received this fax in error, please notify us immediately by telephone so that we can arrange for its return to [place]. Phone: [phone], Toll-Free: [phone], Fax: [phone] Page: 1 of 2 Call Id: [id number] [place] Patient Name: [name] [name] Gender: Female DOB: 02/14/1941 Age: 74 Y 11 M 10 D Return Phone Number: [phone] (Primary), [phone] (Secondary) Address: City/State/Zip: [place] Client [place] Night - Client Client Site [place] Physician Ria Bush Contact Type Call Call Type Triage / Clinical Relationship To Patient Self Return Phone Number [phone] (Primary) Chief Complaint Urine, Blood In Initial Comment Caller states is having chills today. Keeps going to bathroom, been taking meds for swelling. Is 99.7. Fever. Blood in urine PreDisposition Call Doctor Nurse Assessment Nurse: Arnoldo Morale, RN, Shelton Silvas Date/Time Eilene Ghazi Time): 04/10/2015 7:59:13 PM Confirm and document reason for call. If symptomatic, describe symptoms. ---Caller states is having chills today. Keeps going to bathroom, been taking meds for swelling. Is 99.7. Fever. Blood in urine. Also has flank pain. Has the patient traveled out of the country within the last 30 days? ---Not Applicable Does the patient require triage?  ---Yes Related visit to physician within the last 2 weeks? ---No Does the PT have any chronic conditions? (i.e. diabetes, asthma, etc.) ---Yes List chronic conditions. ---Multiple systems atropathy Guidelines Guideline Title Affirmed Question Affirmed Notes Nurse Date/Time (Eastern Time) Urine - Blood In Side (flank) or back pain present Evelina Dun 04/10/2015 7:59:43 PM Disp. Time Eilene Ghazi Time) Disposition Final User 04/10/2015 7:48:15 PM Attempt made - message left Evelina Dun 04/10/2015 8:03:30 PM See Physician within 24 Hours Yes Arnoldo Morale, RN, Marga Melnick Understands: Yes Disagree/Comply: Comply Care Advice Given Per Guideline PLEASE NOTE: All timestamps contained within this report are represented as [place] Standard Time. CONFIDENTIALTY NOTICE: This fax transmission is intended only for the addressee. It contains information that is legally privileged, confidential or otherwise protected from use or disclosure. If you are not the intended recipient, you are strictly prohibited from reviewing, disclosing, copying using or disseminating any of this information or taking any action in reliance on or regarding this information. If you have received this fax in error, please notify us immediately by telephone so that we can arrange for its return to [place]. Phone: [phone], Toll-Free: [phone], Fax: [phone] Page: 2 of 2 Call Id: [id number] Care Advice Given Per Guideline SEE PHYSICIAN WITHIN 24 HOURS: BRING MEDICINES: * Please bring a list of your current medicines when you go to see the doctor. * It is also a good idea to bring the pill bottles too. This will help the doctor to make certain you are taking the right medicines and the right dose. CALL BACK IF: * Fever occurs CARE ADVICE given per Urine, Blood In (Adult) guideline. * You become worse. ACETAMINOPHEN (E.G., TYLENOL): * Take 650 mg (two 325 mg pills) by mouth every 4-6 hours as needed. Each Regular  Strength Tylenol pill has 325 mg of  acetaminophen. The most you should take each day is 3,250 mg (10 Regular Strength pills a day). After Care Instructions Given Call Event Type User Date / Time Description Referrals REFERRED TO PCP OFFICE

## 2015-04-11 NOTE — Telephone Encounter (Signed)
Spoke with patient. She is starting to feel some better. She still has frequency, but is generally feeling better. She believes she is on the right track and once the abx are in her system a little bit longer she will be better.

## 2015-04-11 NOTE — Telephone Encounter (Addendum)
Spoke with patient. Will need new order prior to next infusion in 3 months. Will complete and send over. In your IN box for completion.

## 2015-04-11 NOTE — ED Notes (Signed)
Patient is from home. States she placed on lasix  on Friday and been having Polyuria since then. States her PCP took her off the Lasix and continues to have to urinate. Patient states she believes she has a UTI. Patient denies any odor or burning while urinating. Patient states she notice blood in her urine today.

## 2015-04-11 NOTE — ED Provider Notes (Signed)
CSN: [id number]     Arrival date & time 04/11/15  0086 History  This chart was scribed for Linton Flemings, MD by Rayfield Citizen, ED Scribe. This patient was seen in room D32C/D32C and the patient's care was started at 4:23 AM.    Chief Complaint  Patient presents with  . Polyuria   The history is provided by the patient. No language interpreter was used.    HPI Comments: [name] [name] is a 74 y.o. female with past medical history of HLD, CAD, multiple system atrophy, irritable bladder who presents to the Emergency Department complaining of polyuria. Patient explains she was on a Lasix over the weekend (04/05/15 - 04/07/15); she has been experiencing polyuria since that time, as well as slight hematuria and mild LLQ pain. She describes her current symptoms as similar to her prior experiences with UTIs. Patient also notes slight constipation (relieved today with a suppository). Her lower extremity swelling has improved slightly after the Lasix; patient attributes most of her swelling to her confinement to a wheelchair. She has been eating and drinking without difficulty. She denies dysuria or malodorous urine.   PCP Dr. Danise Mina; she was due for a follow up today (04/11/15).   Past Medical History  Diagnosis Date  . GERD (gastroesophageal reflux disease)     s/p nissen  . Familial tremor     followed by Dr. Erling Cruz  . Unspecified chronic bronchitis   . Irritable bladder   . History of pneumonia   . Hypothyroidism   . Depression   . Asthma   . CAD (coronary artery disease)     myoview 5/08:  EF 76%, no scar, no ischemia, +ECG changes with exercise;  cath 04/08/07:  pLAD 30%, mLAD 60-70% - med tx.  Marland Kitchen HLD (hyperlipidemia)   . Multiple system atrophy     [name]  . VAGINITIS, ATROPHIC 11/07/2007  . Osteoporosis 2016    DEXA T-4.4 spine deteriorated since 2012   Past Surgical History  Procedure Laterality Date  . Laparoscopic nissen fundoplication    . Cystectomy    . Tumor removal     . Tubal ligation    . Colonoscopy N/A 08/29/2013    int hemm; Inda Castle, MD  . Dexa  12/2014    T -4.4 spine  . Esophagogastroduodenoscopy N/A 01/09/2015    Procedure: ESOPHAGOGASTRODUODENOSCOPY (EGD);  Surgeon: Inda Castle, MD;  Location: [facility];  Service: Endoscopy;  Laterality: N/A;  help with transfers   Family History  Problem Relation Age of Onset  . Coronary artery disease    . Heart attack    . Hypertension    . Diabetes    . Breast cancer    . Hypertension Mother   . Heart disease Father    History  Substance Use Topics  . Smoking status: Never Smoker   . Smokeless tobacco: Never Used  . Alcohol Use: No   OB History    No data available     Review of Systems  Endocrine: Positive for polyuria.  All other systems reviewed and are negative.   Allergies  Review of patient's allergies indicates no known allergies.  Home Medications   Prior to Admission medications   Medication Sig Start Date End Date Taking? Authorizing Provider  AFLURIA PRESERVATIVE FREE injection  08/22/13   Historical Provider, MD  albuterol (PROAIR HFA) 108 (90 BASE) MCG/ACT inhaler Inhale 2 puffs into the lungs every 6 (six) hours as needed for wheezing  or shortness of breath.     Historical Provider, MD  alum & mag hydroxide-simeth (MYLANTA) 200-200-20 MG/5ML suspension Take 15 mLs by mouth every 6 (six) hours as needed for indigestion or heartburn.    Historical Provider, MD  aspirin (ASPIRIN EC) 81 MG EC tablet Take 81 mg by mouth 2 (two) times a week. Swallow whole.    Historical Provider, MD  beclomethasone (QVAR) 80 MCG/ACT inhaler Inhale 1 puff into the lungs daily as needed (wheezing).     Historical Provider, MD  Calcium Carbonate-Vitamin D (CALCIUM-VITAMIN D) 500-200 MG-UNIT per tablet Take 1 tablet by mouth daily.      Historical Provider, MD  cholecalciferol (VITAMIN D) 1000 UNITS tablet Take 1,000 Units by mouth daily.    Historical Provider, MD  clonazePAM  (KLONOPIN) 0.5 MG tablet TAKE 1-2 TABLETS BY MOUTH ONCE A DAY AS NEEDED 01/31/15   Ria Bush, MD  CRESTOR 20 MG tablet TAKE ONE-HALF TABLET BY MOUTH ONCE EVERY EVENING 12/17/14   [name], MD  cyanocobalamin (,VITAMIN B-12,) 1000 MCG/ML injection Inject 1,000 mcg into the muscle every 30 (thirty) days.      Historical Provider, MD  dexlansoprazole (DEXILANT) 60 MG capsule Take 1 capsule (60 mg total) by mouth daily. 03/13/15   Inda Castle, MD  fluocinolone (VANOS) 0.01 % cream Apply 1 application topically 2 (two) times daily as needed (itching).  07/15/13   Historical Provider, MD  furosemide (LASIX) 20 MG tablet Take 1 tablet (20 mg total) by mouth daily as needed for fluid or edema. 04/04/15   Ria Bush, MD  hydrOXYzine (ATARAX/VISTARIL) 10 MG tablet Take 10 mg by mouth 3 (three) times daily as needed for itching.    Historical Provider, MD  hyoscyamine (LEVSIN SL) 0.125 MG SL tablet PLACE 2 TABLETS UNDER THE TONGUE EVERY 4 (FOUR) HOURS AS NEEDED. 03/25/15   Inda Castle, MD  ibandronate (BONIVA) 3 MG/3ML SOLN injection Inject 3 mg into the vein once. Infuse every 3 months    Historical Provider, MD  isosorbide mononitrate (IMDUR) 30 MG 24 hr tablet TAKE 1/2 TABLET BY MOUTH DAILY 08/06/14   [name], MD  levothyroxine (SYNTHROID, LEVOTHROID) 112 MCG tablet Take 112 mcg by mouth daily before breakfast.    Historical Provider, MD  loratadine (CLARITIN) 10 MG tablet Take 10 mg by mouth 2 (two) times daily as needed.     Historical Provider, MD  mometasone (NASONEX) 50 MCG/ACT nasal spray Place 2 sprays into the nose daily as needed (allergies).     Historical Provider, MD  nitroGLYCERIN (NITROSTAT) 0.4 MG SL tablet Place 0.4 mg under the tongue every 5 (five) minutes as needed.      Historical Provider, MD  propranolol (INDERAL) 10 MG tablet Take 1 tablet (10 mg total) by mouth 3 (three) times daily as needed (Tremors). 12/18/14   Ria Bush, MD  sertraline (ZOLOFT) 25 MG  tablet TAKE 1 TABLET (25 MG TOTAL) BY MOUTH EVERY MORNING. 10/02/14   Ria Bush, MD   BP 161/72 mmHg  Pulse 104  Temp(Src) 98.2 F (36.8 C) (Oral)  Resp 21  SpO2 99% Physical Exam  Constitutional: She is oriented to person, place, and time. She appears well-developed and well-nourished.  HENT:  Head: Normocephalic and atraumatic.  Neck: No tracheal deviation present.  Cardiovascular: Normal rate, regular rhythm, normal heart sounds and intact distal pulses.  Exam reveals no gallop and no friction rub.   No murmur heard. Pulmonary/Chest: Effort normal. No respiratory distress.  She has no wheezes. She has no rales. She exhibits no tenderness.  Abdominal: Soft. Bowel sounds are normal. She exhibits no distension and no mass. There is no tenderness. There is no rebound and no guarding.  Musculoskeletal: She exhibits edema (1+ edema to mid shin). She exhibits no tenderness.  Neurological: She is alert and oriented to person, place, and time.  Skin: Skin is warm and dry. No rash noted. No erythema. No pallor.  Psychiatric: She has a normal mood and affect. Her behavior is normal.  Nursing note and vitals reviewed.   ED Course  Procedures   DIAGNOSTIC STUDIES: Oxygen Saturation is 100% on RA, normal by my interpretation.    COORDINATION OF CARE: 4:30 AM Discussed treatment plan with pt at bedside and pt agreed to plan.   Labs Review Labs Reviewed  URINALYSIS, ROUTINE W REFLEX MICROSCOPIC (NOT AT [facility]) - Abnormal; Notable for the following:    APPearance CLOUDY (*)    Hgb urine dipstick LARGE (*)    Protein, ur 30 (*)    Leukocytes, UA LARGE (*)    All other components within normal limits  BASIC METABOLIC PANEL - Abnormal; Notable for the following:    Glucose, Bld 128 (*)    All other components within normal limits  CBC WITH DIFFERENTIAL/PLATELET - Abnormal; Notable for the following:    WBC 11.1 (*)    Neutro Abs 7.9 (*)    Monocytes Absolute 1.1 (*)    All other  components within normal limits  URINE MICROSCOPIC-ADD ON - Abnormal; Notable for the following:    Bacteria, UA FEW (*)    All other components within normal limits  URINE CULTURE    Imaging Review No results found.   EKG Interpretation None      MDM   Final diagnoses:  UTI (lower urinary tract infection)    I personally performed the services described in this documentation, which was scribed in my presence. The recorded information has been reviewed and is accurate.  Patient with urinary frequency since starting on Lasix, has not taken Lasix since Sunday.  Patient was to be seen by her primary care Dr. day, but felt that her husband could not care for her.  Plan to check labs, urinalysis.     Linton Flemings, MD 04/11/15 [phone]

## 2015-04-11 NOTE — Discharge Instructions (Signed)
Antibiotic Medication Antibiotics are among the most frequently prescribed medicines. Antibiotics cure illness by assisting our body to injure or kill the bacteria that cause infection. While antibiotics are useful to treat a wide variety of infections they are useless against viruses. Antibiotics cannot cure colds, flu, or other viral infections.  There are many types of antibiotics available. Your caregiver will decide which antibiotic will be useful for an illness. Never take or give someone else's antibiotics or left over medicine. Your caregiver may also take into account:  Allergies.  The cost of the medicine.  Dosing schedules.  Taste.  Common side effects when choosing an antibiotic for an infection. Ask your caregiver if you have questions about why a certain medicine was chosen. HOME CARE INSTRUCTIONS Read all instructions and labels on medicine bottles carefully. Some antibiotics should be taken on an empty stomach while others should be taken with food. Taking antibiotics incorrectly may reduce how well they work. Some antibiotics need to be kept in the refrigerator. Others should be kept at room temperature. Ask your caregiver or pharmacist if you do not understand how to give the medicine. Be sure to give the amount of medicine your caregiver has prescribed. Even if you feel better and your symptoms improve, bacteria may still remain alive in the body. Taking all of the medicine will prevent:  The infection from returning and becoming harder to treat.  Complications from partially treated infections. If there is any medicine left over after you have taken the medicine as your caregiver has instructed, throw the medicine away. Be sure to tell your caregiver if you:  Are allergic to any medicines.  Are pregnant or intend to become pregnant while using this medicine.  Are breastfeeding.  Are taking any other prescription, non-prescription medicine, or herbal  remedies.  Have any other medical conditions or problems you have not already discussed. If you are taking birth control pills, they may not work while you are on antibiotics. To avoid unwanted pregnancy:  Continue taking your birth control pills as usual.  Use a second form of birth control (such as condoms) while you are taking antibiotic medicine.  When you finish taking the antibiotic medicine, continue using the second form of birth control until you are finished with your current 1 month cycle of birth control pills. Try not to miss any doses of medicine. If you miss a dose, take it as soon as possible. However, if it is almost time for the next dose and the dosing schedule is:  2 doses a day, take the missed dose and the next dose 5 to 6 hours apart.  3 or more doses a day, take the missed dose and the next dose 2 to 4 hours apart, then go back to the normal schedule.  If you are unable to make up a missed dose, take the next scheduled dose on time and complete the missed dose at the end of the prescribed time for your medicine. SIDE EFFECTS TO TAKING ANTIBIOTICS Common side effects to antibiotic use include:  Soft stools or diarrhea.  Mild stomach upset.  Sun sensitivity. SEEK MEDICAL CARE IF:   If you get worse or do not improve within a few days of starting the medicine.  Vomiting develops.  Diaper rash or rash on the genitals appears.  Vaginal itching occurs.  White patches appear on the tongue or in the mouth.  Severe watery diarrhea and abdominal cramps occur.  Signs of an allergy develop (hives, unknown  itchy rash appears). STOP TAKING THE ANTIBIOTIC. SEEK IMMEDIATE MEDICAL CARE IF:   Urine turns dark or blood colored.  Skin turns yellow.  Easy bruising or bleeding occurs.  Joint pain or muscle aches occur.  Fever returns.  Severe headache occurs.  Signs of an allergy develop (trouble breathing, wheezing, swelling of the lips, face or tongue,  fainting, or blisters on the skin or in the mouth). STOP TAKING THE ANTIBIOTIC. Document Released: 07/08/2004 Document Revised: 01/18/2012 Document Reviewed: 07/18/2009 [facility] Patient Information 2015 [place], [place]. This information is not intended to replace advice given to you by your health care provider. Make sure you discuss any questions you have with your health care provider.  Urinary Tract Infection Urinary tract infections (UTIs) can develop anywhere along your urinary tract. Your urinary tract is your body's drainage system for removing wastes and extra water. Your urinary tract includes two kidneys, two ureters, a bladder, and a urethra. Your kidneys are a pair of bean-shaped organs. Each kidney is about the size of your fist. They are located below your ribs, one on each side of your spine. CAUSES Infections are caused by microbes, which are microscopic organisms, including fungi, viruses, and bacteria. These organisms are so small that they can only be seen through a microscope. Bacteria are the microbes that most commonly cause UTIs. SYMPTOMS  Symptoms of UTIs may vary by age and gender of the patient and by the location of the infection. Symptoms in young women typically include a frequent and intense urge to urinate and a painful, burning feeling in the bladder or urethra during urination. Older women and men are more likely to be tired, shaky, and weak and have muscle aches and abdominal pain. A fever may mean the infection is in your kidneys. Other symptoms of a kidney infection include pain in your back or sides below the ribs, nausea, and vomiting. DIAGNOSIS To diagnose a UTI, your caregiver will ask you about your symptoms. Your caregiver also will ask to provide a urine sample. The urine sample will be tested for bacteria and white blood cells. White blood cells are made by your body to help fight infection. TREATMENT  Typically, UTIs can be treated with medication. Because  most UTIs are caused by a bacterial infection, they usually can be treated with the use of antibiotics. The choice of antibiotic and length of treatment depend on your symptoms and the type of bacteria causing your infection. HOME CARE INSTRUCTIONS  If you were prescribed antibiotics, take them exactly as your caregiver instructs you. Finish the medication even if you feel better after you have only taken some of the medication.  Drink enough water and fluids to keep your urine clear or pale yellow.  Avoid caffeine, tea, and carbonated beverages. They tend to irritate your bladder.  Empty your bladder often. Avoid holding urine for long periods of time.  Empty your bladder before and after sexual intercourse.  After a bowel movement, women should cleanse from front to back. Use each tissue only once. SEEK MEDICAL CARE IF:   You have back pain.  You develop a fever.  Your symptoms do not begin to resolve within 3 days. SEEK IMMEDIATE MEDICAL CARE IF:   You have severe back pain or lower abdominal pain.  You develop chills.  You have nausea or vomiting.  You have continued burning or discomfort with urination. MAKE SURE YOU:   Understand these instructions.  Will watch your condition.  Will get help right away  if you are not doing well or get worse. Document Released: 08/05/2005 Document Revised: 04/26/2012 Document Reviewed: 12/04/2011 [facility] Patient Information 2015 [place], [place]. This information is not intended to replace advice given to you by your health care provider. Make sure you discuss any questions you have with your health care provider.

## 2015-04-12 ENCOUNTER — Inpatient Hospital Stay: Admission: RE | Admit: 2015-04-12 | Payer: Medicare Other | Source: Ambulatory Visit

## 2015-04-12 NOTE — Telephone Encounter (Signed)
signed and in Kim's box.

## 2015-04-13 ENCOUNTER — Ambulatory Visit (INDEPENDENT_AMBULATORY_CARE_PROVIDER_SITE_OTHER): Payer: Medicare Other | Admitting: Family Medicine

## 2015-04-13 ENCOUNTER — Encounter: Payer: Self-pay | Admitting: Family Medicine

## 2015-04-13 VITALS — BP 122/78 | HR 72 | Temp 97.5°F

## 2015-04-13 DIAGNOSIS — N3000 Acute cystitis without hematuria: Secondary | ICD-10-CM | POA: Diagnosis not present

## 2015-04-13 LAB — URINE CULTURE

## 2015-04-13 MED ORDER — NITROFURANTOIN MONOHYD MACRO 100 MG PO CAPS: 100.0000 mg | ORAL_CAPSULE | Freq: Two times a day (BID) | ORAL | Status: DC

## 2015-04-13 NOTE — Progress Notes (Signed)
Pre visit review using our clinic review tool, if applicable. No additional management support is needed unless otherwise documented below in the visit note. 

## 2015-04-13 NOTE — Progress Notes (Signed)
   Subjective:    Patient ID: [name] [name], female    DOB: 1941/10/01, 74 y.o.   MRN: [id number]  HPI Here to follow up a recently diagnosed UTI and for diarrhea. She had been having urinary frequency and urgency and burning last week, and she went to the ER on 04-11-15. She was found to have a UTI and was placed on Keflex 500 mg bid. Her culture grew a pan-sensitive E coli. However after starting the Keflex she developed diarrhea and had to stop taking it after only 4 doses. Her last dose was last night. She took some Imodium and the diarrhea stopped. She has had no diarrhea today. Drinking plenty of fluids. No abdominal pain or fever.    Review of Systems  Gastrointestinal: Positive for diarrhea. Negative for nausea, vomiting, abdominal pain, constipation, blood in stool, abdominal distention, anal bleeding and rectal pain.  Genitourinary: Positive for dysuria, urgency and frequency. Negative for flank pain and pelvic pain.       Objective:   Physical Exam  Constitutional:  Alert, in a wheelchair   Cardiovascular: Normal rate, regular rhythm, normal heart sounds and intact distal pulses.   Pulmonary/Chest: Effort normal and breath sounds normal.  Abdominal: Soft. Bowel sounds are normal. She exhibits no distension.          Assessment & Plan:  Partially treated UTI. The diarrhea was from the Keflex so this should stop not that she is off the keflex. Use Imodium prn. Start on [employer].

## 2015-04-14 ENCOUNTER — Telehealth (HOSPITAL_COMMUNITY): Payer: Self-pay

## 2015-04-14 NOTE — Telephone Encounter (Signed)
Post ED Visit - Positive Culture Follow-up  Culture report reviewed by antimicrobial stewardship pharmacist: []  Wes Frankton, Pharm.D., BCPS []  Heide Guile, Pharm.D., BCPS []  Alycia Rossetti, Pharm.D., BCPS []  [place], [place].D., BCPS, AAHIVP [x]  Legrand Como, Pharm.D., BCPS, AAHIVP []  Isac Sarna, Pharm.D., BCPS  Positive Urine culture, >/= 100,000 colonies -> E Coli Treated with Cephalexin, organism sensitive to the same and no further patient follow-up is required at this time.  Dortha Kern 04/14/2015, 7:42 PM

## 2015-04-15 ENCOUNTER — Other Ambulatory Visit: Payer: Self-pay | Admitting: Family Medicine

## 2015-04-15 ENCOUNTER — Telehealth: Payer: Self-pay

## 2015-04-15 ENCOUNTER — Telehealth: Payer: Self-pay | Admitting: Family Medicine

## 2015-04-15 NOTE — Telephone Encounter (Signed)
PLEASE NOTE: All timestamps contained within this report are represented as [place] Standard Time. CONFIDENTIALTY NOTICE: This fax transmission is intended only for the addressee. It contains information that is legally privileged, confidential or otherwise protected from use or disclosure. If you are not the intended recipient, you are strictly prohibited from reviewing, disclosing, copying using or disseminating any of this information or taking any action in reliance on or regarding this information. If you have received this fax in error, please notify us immediately by telephone so that we can arrange for its return to [place]. Phone: [phone], Toll-Free: [phone], Fax: [phone] Page: 1 of 2 Call Id: [id number] [place] Patient Name: [name] [name] Gender: Female DOB: 08/07/1941 Age: 74 Y 11 M 12 D Return Phone Number: [phone] (Primary), [phone] (Secondary) [place] [address] Client Lillian Night - Client Client Site [facility] Physician Ria Bush Contact Type Call Call Type Triage / Clinical Relationship To Patient Self Return Phone Number [phone] (Primary) Chief Complaint Diarrhea Initial Comment Caller states she has diarrhea after being put on an antibiotic. Needs to know what she should do to resolve issue. PreDisposition Go to ED Nurse Assessment Nurse: Margaretmary Dys, RN, Hettie Date/Time Eilene Ghazi Time): 04/12/2015 9:05:43 PM Confirm and document reason for call. If symptomatic, describe symptoms. ---Caller states she has diarrhea after being put on an antibiotic. Needs to know what she should do to resolve issue. Went to ER yest. Dx with UTI. Blood in urine. Cephlexin 500 mg q 12 hr rxed. One loose stool. Has the patient traveled out of the country within the last  30 days? ---Not Applicable Does the patient require triage? ---Yes Related visit to physician within the last 2 weeks? ---Yes Does the PT have any chronic conditions? (i.e. diabetes, asthma, etc.) ---Yes List chronic conditions. ---multiple systems atrophy, heart problems, in wheelchair Guidelines Guideline Title Affirmed Question Affirmed Notes Nurse Date/Time (Eastern Time) Diarrhea Weak immune system (e.g., HIV positive, cancer chemo, splenectomy, organ transplant, chronic steroids) Brewner, RN, Hettie 04/12/2015 9:10:47 PM Disp. Time Eilene Ghazi Time) Disposition Final User 04/12/2015 9:18:32 PM See Physician within 24 Hours Yes Brewner, RN, Hettie Caller Understands: Yes Disagree/Comply: Comply PLEASE NOTE: All timestamps contained within this report are represented as [place] Standard Time. CONFIDENTIALTY NOTICE: This fax transmission is intended only for the addressee. It contains information that is legally privileged, confidential or otherwise protected from use or disclosure. If you are not the intended recipient, you are strictly prohibited from reviewing, disclosing, copying using or disseminating any of this information or taking any action in reliance on or regarding this information. If you have received this fax in error, please notify us immediately by telephone so that we can arrange for its return to [place]. Phone: [phone], Toll-Free: [phone], Fax: [phone] Page: 2 of 2 Call Id: [id number] Care Advice Given Per Guideline SEE PHYSICIAN WITHIN 24 HOURS: * IF OFFICE WILL BE OPEN: You need to be examined within the next 24 hours. Call your doctor when the office opens, and make an appointment. DO NOT USE - Bismuth Subsalicylate (e.g., Kaopectate, Pepto- Bismol): * Do not take Kaopectate or Pepto-Bismol for the diarrhea. * Reason: Diarrhea in immunocompromised patients is often chronic and there could be side effects from taking it chronically. FLUID THERAPY DURING  MILD-MODERATE DIARRHEA: * Drink more fluids, at least 8-10 glasses (8 oz) daily. * Supplement this with  saltine crackers or soups, to make certain that you are getting sufficient fluid and salt to meet your body's needs. * Avoid caffeinated beverages (Reason: caffeine is mildly dehydrating). NUTRITION DURING MILD-MODERATE DIARRHEA * Ideal initial foods include boiled starches / cereals (e.g., potatoes, rice, noodles, wheat, oats) with a small amount of salt to taste. * Maintaining some food intake during episodes of diarrhea is important. * Other acceptable foods include: bananas, yogurt, crackers, soup. CALL BACK IF: * Signs of dehydration occur (e.g., no urine over 12 hours, very dry mouth, lightheaded, etc.) * Bloody stools * Constant or severe abdominal pain * You become worse. CARE ADVICE given per Diarrhea (Adult) guideline. After Care Instructions Given Call Event Type User Date / Time Description Referrals REFERRED TO PCP OFFICE

## 2015-04-15 NOTE — Telephone Encounter (Signed)
Message left for patient to return my call.  

## 2015-04-15 NOTE — Telephone Encounter (Signed)
Pt called wanting kim to call her back She has ? About the meds they put her on [facility]

## 2015-04-15 NOTE — Telephone Encounter (Signed)
Noted. Seen at ER then [facility] with UTI - keflex -> macrobid 2/2 diarrhea.

## 2015-04-15 NOTE — Telephone Encounter (Signed)
Pt was seen [place] on 04/14/15.

## 2015-04-17 ENCOUNTER — Telehealth: Payer: Self-pay | Admitting: Family Medicine

## 2015-04-17 NOTE — Telephone Encounter (Signed)
Appt tomorrow.

## 2015-04-17 NOTE — Telephone Encounter (Signed)
Pt has appt schedule on 04/18/15 at 12:15 with Dr Darnell Level.

## 2015-04-17 NOTE — Telephone Encounter (Signed)
Patient Name: [name] [name] DOB: 1941-02-21 Initial Comment Caller states she went to the ER last Thursday, for a UTI. She has an appointment on Monday for follow up. She states she is still go to the restroom frequent. She has not had a BM since Fridaywhich was Diarrhea. Nurse Assessment Nurse: Ronnald Ramp, RN, Miranda Date/Time (Eastern Time): 04/17/2015 1:02:17 PM Confirm and document reason for call. If symptomatic, describe symptoms. ---Caller states she was seen in ED on Thursday and diagnosed with a UTI but still having frequent urination. The antibiotic she was given was causing diarrhea and changed her antibiotic. She has not had a BM since Friday night. Has the patient traveled out of the country within the last 30 days? ---Not Applicable Does the patient require triage? ---Yes Related visit to physician within the last 2 weeks? ---No Does the PT have any chronic conditions? (i.e. diabetes, asthma, etc.) ---Yes List chronic conditions. ---Asthma, GERD, CHD, Depression, Thyroid, Multiple system atrophy, Constipation Guidelines Guideline Title Affirmed Question Affirmed Notes Urinary Tract Infection on Antibiotic Follow-up Call - Female [1] Taking antibiotic > 72 hours (3 days) for UTI AND [2] painful urination or frequency not improved Constipation Last bowel movement (BM) > 4 days ago Final Disposition User See Physician within 24 Hours Ronnald Ramp, RN, Miranda Comments Appt scheduled for 1230 Thurs 6/9 with Dr. Danise Mina.

## 2015-04-17 NOTE — Telephone Encounter (Signed)
Order faxed to Short Stay.

## 2015-04-18 ENCOUNTER — Encounter: Payer: Self-pay | Admitting: Family Medicine

## 2015-04-18 ENCOUNTER — Ambulatory Visit (INDEPENDENT_AMBULATORY_CARE_PROVIDER_SITE_OTHER): Payer: Medicare Other | Admitting: Family Medicine

## 2015-04-18 ENCOUNTER — Ambulatory Visit: Payer: Self-pay | Admitting: Family Medicine

## 2015-04-18 VITALS — BP 122/64 | HR 76 | Temp 97.4°F

## 2015-04-18 DIAGNOSIS — R600 Localized edema: Secondary | ICD-10-CM | POA: Diagnosis not present

## 2015-04-18 DIAGNOSIS — N3020 Other chronic cystitis without hematuria: Secondary | ICD-10-CM | POA: Insufficient documentation

## 2015-04-18 DIAGNOSIS — N390 Urinary tract infection, site not specified: Secondary | ICD-10-CM

## 2015-04-18 DIAGNOSIS — K5900 Constipation, unspecified: Secondary | ICD-10-CM | POA: Diagnosis not present

## 2015-04-18 LAB — POCT URINALYSIS DIPSTICK
BILIRUBIN UA: NEGATIVE
Blood, UA: NEGATIVE
Glucose, UA: NEGATIVE
Ketones, UA: NEGATIVE
Leukocytes, UA: NEGATIVE
Nitrite, UA: NEGATIVE
PH UA: 7.5
Protein, UA: NEGATIVE
Spec Grav, UA: 1.015
Urobilinogen, UA: 0.2

## 2015-04-18 MED ORDER — POLYETHYLENE GLYCOL 3350 17 GM/SCOOP PO POWD
17.0000 g | Freq: Every day | ORAL | Status: DC | PRN
Start: 2015-04-18 — End: 2017-07-11

## 2015-04-18 NOTE — Assessment & Plan Note (Signed)
Complete macrobid course. sxs overall improving. Reassured.  Consider bladder antispasmodic if persistent frequency in h/o overactive bladder.

## 2015-04-18 NOTE — Progress Notes (Signed)
Pre visit review using our clinic review tool, if applicable. No additional management support is needed unless otherwise documented below in the visit note. 

## 2015-04-18 NOTE — Progress Notes (Signed)
BP 122/64 mmHg  Pulse 76  Temp(Src) 97.4 F (36.3 C) (Oral)   CC: ER f/u visit  Subjective:    Patient ID: KATRENA STEHLIN, female    DOB: Aug 30, 1941, 74 y.o.   MRN: [id number]  HPI: DAVY FAUGHT is a 74 y.o. female presenting on 04/18/2015 for Follow-up   Several medical visits over last 2 weeks. 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 She was started on lasix 62m daily for 3 days, as well as other conservative care recommendations. Seen at ER 04/11/2015 with dx UTI and UCx grew >100k pansensitive Ecoli. Seen 6/4 at our Saturday clinic with antibiotic induced diarrhea (keflex) so this was changed to macrobid 1018mbid x 7d course.  Now she notices no BM since Friday night (diarrhea). She did take immodium for diarrhea at that time. Appetite improving.   Last UTI was 7 yrs ago.   No fevers/chills, abd pain, UTI sxs are improving. Passing gas. Persistent frequency noted.  Relevant past medical, surgical, family and social history reviewed and updated as indicated. Interim medical history since our last visit reviewed. Allergies and medications reviewed and updated. Current Outpatient Prescriptions on File Prior to Visit  Medication Sig  . AFLURIA PRESERVATIVE FREE injection   . albuterol (PROAIR HFA) 108 (90 BASE) MCG/ACT inhaler Inhale 2 puffs into the lungs every 6 (six) hours as needed for wheezing or shortness of breath.   . Marland Kitchenlum & mag hydroxide-simeth (MYLANTA) 20250-037-04G/5ML suspension Take 15 mLs by mouth every 6 (six) hours as needed for indigestion or heartburn.  . Marland Kitchenspirin (ASPIRIN EC) 81 MG EC tablet Take 81 mg by mouth 2 (two) times a week. Swallow whole. Take at bedtime  . beclomethasone (QVAR) 80 MCG/ACT inhaler Inhale 1 puff into the lungs daily as needed (wheezing).   . Calcium Carbonate-Vitamin D (CALCIUM-VITAMIN D) 500-200 MG-UNIT per tablet Take 1 tablet by mouth daily.    . clonazePAM (KLONOPIN) 0.5 MG tablet TAKE 1-2 TABLETS  BY MOUTH ONCE A DAY AS NEEDED  . CRESTOR 20 MG tablet TAKE ONE-HALF TABLET BY MOUTH ONCE EVERY EVENING  . cyanocobalamin (,VITAMIN B-12,) 1000 MCG/ML injection Inject 1,000 mcg into the muscle every 30 (thirty) days.    . Marland Kitchenexlansoprazole (DEXILANT) 60 MG capsule Take 1 capsule (60 mg total) by mouth daily.  . fluocinolone (VANOS) 0.01 % cream Apply 1 application topically 2 (two) times daily as needed (itching).   . furosemide (LASIX) 20 MG tablet Take 1 tablet (20 mg total) by mouth daily as needed for fluid or edema.  . hydrOXYzine (ATARAX/VISTARIL) 10 MG tablet Take 10 mg by mouth 3 (three) times daily as needed for itching.  . hyoscyamine (LEVSIN SL) 0.125 MG SL tablet PLACE 2 TABLETS UNDER THE TONGUE EVERY 4 (FOUR) HOURS AS NEEDED.  . Marland Kitchenbandronate (BONIVA) 3 MG/3ML SOLN injection Inject 3 mg into the vein once. Infuse every 3 months  . isosorbide mononitrate (IMDUR) 30 MG 24 hr tablet TAKE 1/2 TABLET BY MOUTH DAILY  . levothyroxine (SYNTHROID, LEVOTHROID) 112 MCG tablet Take 112 mcg by mouth daily before breakfast.  . loratadine (CLARITIN) 10 MG tablet Take 10 mg by mouth 2 (two) times daily as needed.   . mometasone (NASONEX) 50 MCG/ACT nasal spray Place 2 sprays into the nose daily as needed (allergies).   . nitrofurantoin, macrocrystal-monohydrate, (MACROBID) 100 MG capsule Take 1 capsule (100 mg total) by mouth 2 (two) times daily.  . nitroGLYCERIN (NITROSTAT) 0.4 MG  SL tablet Place 0.4 mg under the tongue every 5 (five) minutes as needed.    . propranolol (INDERAL) 10 MG tablet TAKE 1 TABLET (10 MG TOTAL) BY MOUTH 3 (THREE) TIMES DAILY AS NEEDED (TREMORS).  Marland Kitchen sertraline (ZOLOFT) 25 MG tablet TAKE 1 TABLET (25 MG TOTAL) BY MOUTH EVERY MORNING.  . cholecalciferol (VITAMIN D) 1000 UNITS tablet Take 1,000 Units by mouth daily.   No current facility-administered medications on file prior to visit.    Review of Systems Per HPI unless specifically indicated above     Objective:    BP  122/64 mmHg  Pulse 76  Temp(Src) 97.4 F (36.3 C) (Oral)  Wt Readings from Last 3 Encounters:  04/03/15 145 lb (65.772 kg)  01/11/15 150 lb (68.04 kg)  01/09/15 151 lb (68.493 kg)    Physical Exam  Constitutional: She appears well-developed and well-nourished. No distress.  HENT:  Mouth/Throat: Oropharynx is clear and moist. No oropharyngeal exudate.  Cardiovascular: Normal rate, regular rhythm, normal heart sounds and intact distal pulses.   No murmur heard. Pulmonary/Chest: Effort normal and breath sounds normal. No respiratory distress. She has no wheezes. She has no rales.  Abdominal: Soft. Normal appearance and bowel sounds are normal. She exhibits no distension and no mass. There is no hepatosplenomegaly. There is no tenderness. There is no rebound, no guarding and no CVA tenderness.  Musculoskeletal: She exhibits edema (tr pedal edema).  Skin: Skin is warm and dry. No rash noted.  Nl skin turgor  Nursing note and vitals reviewed.  Results for orders placed or performed in visit on 04/18/15  POCT Urinalysis Dipstick  Result Value Ref Range   Color, UA Yellow    Clarity, UA Clear    Glucose, UA Negative    Bilirubin, UA Negative    Ketones, UA Negative    Spec Grav, UA 1.015    Blood, UA Negative    pH, UA 7.5    Protein, UA Negative    Urobilinogen, UA 0.2    Nitrite, UA Negative    Leukocytes, UA Negative       Assessment & Plan:   Problem List Items Addressed This Visit    Constipation - Primary    Some constipation after keflex-induced diarrhea. However, not consistent with obstruction picture - no abd pain, passing gas well. rec no more immodium. Start prune juice, and if no result recommend restart miralax 1/2-1 capful daily prn. Pt and husband agree with plan.      Pedal edema    Improved with 3d lasix but too strong. If needed in future, consider 1/2 tab daily.      Urinary tract infectious disease    Complete macrobid course. sxs overall improving.  Reassured.  Consider bladder antispasmodic if persistent frequency in h/o overactive bladder.      Relevant Orders   POCT Urinalysis Dipstick (Completed)       Follow up plan: Return if symptoms worsen or fail to improve.

## 2015-04-18 NOTE — Patient Instructions (Signed)
Urine looking ok today. Finish macrobid. Drink prune juice today - if no bowel movement, may start miralax 1/2 - 1 capful daily as needed for constipation (hold for diarrhea). This was sent to your pharmacy. Keep August appointment. Nice to see you today, call us with questions.

## 2015-04-18 NOTE — Assessment & Plan Note (Signed)
Improved with 3d lasix but too strong. If needed in future, consider 1/2 tab daily.

## 2015-04-18 NOTE — Assessment & Plan Note (Signed)
Some constipation after keflex-induced diarrhea. However, not consistent with obstruction picture - no abd pain, passing gas well. rec no more immodium. Start prune juice, and if no result recommend restart miralax 1/2-1 capful daily prn. Pt and husband agree with plan.

## 2015-04-19 ENCOUNTER — Other Ambulatory Visit: Payer: Self-pay | Admitting: Cardiovascular Disease

## 2015-04-19 NOTE — Telephone Encounter (Signed)
Per note 3.4.16

## 2015-04-22 ENCOUNTER — Ambulatory Visit: Payer: Medicare Other | Admitting: Family Medicine

## 2015-04-23 ENCOUNTER — Ambulatory Visit
Admission: RE | Admit: 2015-04-23 | Discharge: 2015-04-23 | Disposition: A | Discharge status: Home / Self Care | Payer: Medicare Other | Source: Ambulatory Visit | Attending: Neurology | Admitting: Neurology

## 2015-04-23 DIAGNOSIS — R293 Abnormal posture: Secondary | ICD-10-CM

## 2015-04-23 DIAGNOSIS — R202 Paresthesia of skin: Secondary | ICD-10-CM

## 2015-04-23 DIAGNOSIS — E538 Deficiency of other specified B group vitamins: Secondary | ICD-10-CM

## 2015-04-23 DIAGNOSIS — R269 Unspecified abnormalities of gait and mobility: Secondary | ICD-10-CM

## 2015-04-23 DIAGNOSIS — R296 Repeated falls: Secondary | ICD-10-CM

## 2015-04-23 DIAGNOSIS — G20 Parkinson's disease: Secondary | ICD-10-CM | POA: Diagnosis not present

## 2015-04-23 DIAGNOSIS — R200 Anesthesia of skin: Secondary | ICD-10-CM

## 2015-04-23 DIAGNOSIS — G20C Parkinsonism, unspecified: Secondary | ICD-10-CM

## 2015-04-25 ENCOUNTER — Telehealth: Payer: Self-pay | Admitting: Neurology

## 2015-04-25 ENCOUNTER — Ambulatory Visit (INDEPENDENT_AMBULATORY_CARE_PROVIDER_SITE_OTHER): Payer: Medicare Other | Admitting: Family Medicine

## 2015-04-25 ENCOUNTER — Encounter: Payer: Self-pay | Admitting: Family Medicine

## 2015-04-25 ENCOUNTER — Telehealth: Payer: Self-pay | Admitting: Family Medicine

## 2015-04-25 VITALS — BP 110/60 | HR 87 | Temp 97.7°F

## 2015-04-25 DIAGNOSIS — N3000 Acute cystitis without hematuria: Secondary | ICD-10-CM

## 2015-04-25 DIAGNOSIS — R300 Dysuria: Secondary | ICD-10-CM | POA: Diagnosis not present

## 2015-04-25 DIAGNOSIS — G239 Degenerative disease of basal ganglia, unspecified: Secondary | ICD-10-CM | POA: Diagnosis not present

## 2015-04-25 DIAGNOSIS — G903 Multi-system degeneration of the autonomic nervous system: Secondary | ICD-10-CM

## 2015-04-25 LAB — POCT URINALYSIS DIPSTICK
Bilirubin, UA: NEGATIVE
GLUCOSE UA: NEGATIVE
Ketones, UA: NEGATIVE
Nitrite, UA: NEGATIVE
Spec Grav, UA: 1.02
UROBILINOGEN UA: 0.2
pH, UA: 6.5

## 2015-04-25 MED ORDER — CIPROFLOXACIN HCL 250 MG PO TABS: 250.0000 mg | ORAL_TABLET | Freq: Two times a day (BID) | ORAL | Status: DC

## 2015-04-25 NOTE — Progress Notes (Signed)
Pre visit review using our clinic review tool, if applicable. No additional management support is needed unless otherwise documented below in the visit note. 

## 2015-04-25 NOTE — Telephone Encounter (Signed)
[facility] Patient Name: [name] [name] DOB: 02/24/1941 Initial Comment Caller states was in ER the week before, and was seen in office for UTI last week, now having s/s again yesterday Nurse Assessment Nurse: Mechele Dawley, RN, Amy Date/Time Eilene Ghazi Time): 04/25/2015 10:14:40 AM Confirm and document reason for call. If symptomatic, describe symptoms. ---CALLER STATES SHE WAS IN LAST WEEK FOR A FOLLOW UP. SHE WAS IN LAST WEEK. SHE CHECKED A SPECIMEN AND IT WAS CLEAR. SHE HAD FINISHED THE ANTIBIOTICS YESTERDAY SHE WAS GOING TO THE BR IN THE AFTERNOON. CONSTANTLY. SHE DID A SPECIMEN THIS MORNING AND ITS DARKER THAN IT SHOULD BE. NO FEVER. SHE HAS AN OVERACTIVE BLADDER. SHE HAS A NEUROLOGICAL DISEASE. Has the patient traveled out of the country within the last 30 days? ---Not Applicable Does the patient require triage? ---Yes Related visit to physician within the last 2 weeks? ---Yes Does the PT have any chronic conditions? (i.e. diabetes, asthma, etc.) ---Yes List chronic conditions. ---NEURO, HEART PATIENT, Guidelines Guideline Title Affirmed Question Affirmed Notes Urination Pain - Female Age > 42 years Final Disposition User See Physician within [facility], Therapist, sports, Amy Comments PATIENT WAS ON THE PHONE WHILE APPT WAS MADE.

## 2015-04-25 NOTE — Assessment & Plan Note (Signed)
This along with parkinsonism complicates picture If recurrent infections will need referral back to urology [name] [name]).

## 2015-04-25 NOTE — Telephone Encounter (Signed)
Please call patient back regarding her brain MRI: when compared to her brain MRI from September 2013, no significant changes were seen. I looked at the scans in comparison as well.

## 2015-04-25 NOTE — Telephone Encounter (Signed)
Patient is calling to get the results of her MRI results.  Please cll.

## 2015-04-25 NOTE — Addendum Note (Signed)
Addended by: Ria Bush on: 04/25/2015 06:18 PM   Modules accepted: Miquel Dunn

## 2015-04-25 NOTE — Patient Instructions (Addendum)
You have another bladder infection. Treat with cipro 276m twice daily for 1 week. Let [place] if recurrent infection symptoms. Push fluids and rest. Good to see you today, call [place]ith questions.  Urinary Tract Infection Urinary tract infections (UTIs) can develop anywhere along your urinary tract. Your urinary tract is your body's drainage system for removing wastes and extra water. Your urinary tract includes two kidneys, two ureters, a bladder, and a urethra. Your kidneys are a pair of bean-shaped organs. Each kidney is about the size of your fist. They are located below your ribs, one on each side of your spine. CAUSES Infections are caused by microbes, which are microscopic organisms, including fungi, viruses, and bacteria. These organisms are so small that they can only be seen through a microscope. Bacteria are the microbes that most commonly cause UTIs. SYMPTOMS  Symptoms of UTIs may vary by age and gender of the patient and by the location of the infection. Symptoms in young women typically include a frequent and intense urge to urinate and a painful, burning feeling in the bladder or urethra during urination. Older women and men are more likely to be tired, shaky, and weak and have muscle aches and abdominal pain. A fever may mean the infection is in your kidneys. Other symptoms of a kidney infection include pain in your back or sides below the ribs, nausea, and vomiting. DIAGNOSIS To diagnose a UTI, your caregiver will ask you about your symptoms. Your caregiver also will ask to provide a urine sample. The urine sample will be tested for bacteria and white blood cells. White blood cells are made by your body to help fight infection. TREATMENT  Typically, UTIs can be treated with medication. Because most UTIs are caused by a bacterial infection, they usually can be treated with the use of antibiotics. The choice of antibiotic and length of treatment depend on your symptoms and the type of  bacteria causing your infection. HOME CARE INSTRUCTIONS  If you were prescribed antibiotics, take them exactly as your caregiver instructs you. Finish the medication even if you feel better after you have only taken some of the medication.  Drink enough water and fluids to keep your urine clear or pale yellow.  Avoid caffeine, tea, and carbonated beverages. They tend to irritate your bladder.  Empty your bladder often. Avoid holding urine for long periods of time.  Empty your bladder before and after sexual intercourse.  After a bowel movement, women should cleanse from front to back. Use each tissue only once. SEEK MEDICAL CARE IF:   You have back pain.  You develop a fever.  Your symptoms do not begin to resolve within 3 days. SEEK IMMEDIATE MEDICAL CARE IF:   You have severe back pain or lower abdominal pain.  You develop chills.  You have nausea or vomiting.  You have continued burning or discomfort with urination. MAKE SURE YOU:   Understand these instructions.  Will watch your condition.  Will get help right away if you are not doing well or get worse. Document Released: 08/05/2005 Document Revised: 04/26/2012 Document Reviewed: 12/04/2011 EK[facility] CampusPatient Information 2015 [place] This information is not intended to replace advice given to you by your health care provider. Make sure you discuss any questions you have with your health care provider.

## 2015-04-25 NOTE — Progress Notes (Signed)
BP 110/60 mmHg  Pulse 87  Temp(Src) 97.7 F (36.5 C) (Oral)  SpO2 96%   CC: ?UTI  Subjective:    Patient ID: [name] [name], female    DOB: 1941-02-13, 74 y.o.   MRN: [id number]  HPI: [name] [name] is a 74 y.o. female presenting on 04/25/2015 for Urinary Tract Infection   See prior notes for details.  Several medical visits including ER, Saturday clinic and in our office over last 2 weeks.  UCx >100k pansensitive Ecoli (04/11/2015) at ER, treated with keflex --> diarrhea so changed to macrobid 117m BID. Completed 7d course. sxs resolved last visit here (04/18/2015).   Yesterday afternoon started noticing increased frequency and urgency and she needed to wear depens. Also noticed itching and bladder pressure.   No fevers/chills, nausea, hematuria, dysuria. No abd or back pain.   Known h/o OAB.   Does feel she fully voids. Does not wipe from back to front.   04/11/2015 - E coli >100k 04/25/2015 - UCx.   Has seen Dr TGaynelle Arabianin the past.   Relevant past medical, surgical, family and social history reviewed and updated as indicated. Interim medical history since our last visit reviewed. Allergies and medications reviewed and updated. Current Outpatient Prescriptions on File Prior to Visit  Medication Sig  . AFLURIA PRESERVATIVE FREE injection   . albuterol (PROAIR HFA) 108 (90 BASE) MCG/ACT inhaler Inhale 2 puffs into the lungs every 6 (six) hours as needed for wheezing or shortness of breath.   .Marland Kitchenalum & mag hydroxide-simeth (MYLANTA) 2144-315-40MG/5ML suspension Take 15 mLs by mouth every 6 (six) hours as needed for indigestion or heartburn.  .Marland Kitchenaspirin (ASPIRIN EC) 81 MG EC tablet Take 81 mg by mouth 2 (two) times a week. Swallow whole. Take at bedtime  . beclomethasone (QVAR) 80 MCG/ACT inhaler Inhale 1 puff into the lungs daily as needed (wheezing).   . Calcium Carbonate-Vitamin D (CALCIUM-VITAMIN D) 500-200 MG-UNIT per tablet Take 1 tablet by mouth daily.    .  cholecalciferol (VITAMIN D) 1000 UNITS tablet Take 1,000 Units by mouth daily.  . clonazePAM (KLONOPIN) 0.5 MG tablet TAKE 1-2 TABLETS BY MOUTH ONCE A DAY AS NEEDED  . CRESTOR 20 MG tablet TAKE ONE-HALF TABLET BY MOUTH ONCE EVERY EVENING  . cyanocobalamin (,VITAMIN B-12,) 1000 MCG/ML injection Inject 1,000 mcg into the muscle every 30 (thirty) days.    .Marland Kitchendexlansoprazole (DEXILANT) 60 MG capsule Take 1 capsule (60 mg total) by mouth daily.  . fluocinolone (VANOS) 0.01 % cream Apply 1 application topically 2 (two) times daily as needed (itching).   . furosemide (LASIX) 20 MG tablet Take 1 tablet (20 mg total) by mouth daily as needed for fluid or edema.  . hydrOXYzine (ATARAX/VISTARIL) 10 MG tablet Take 10 mg by mouth 3 (three) times daily as needed for itching.  . hyoscyamine (LEVSIN SL) 0.125 MG SL tablet PLACE 2 TABLETS UNDER THE TONGUE EVERY 4 (FOUR) HOURS AS NEEDED.  .Marland Kitchenibandronate (BONIVA) 3 MG/3ML SOLN injection Inject 3 mg into the vein once. Infuse every 3 months  . isosorbide mononitrate (IMDUR) 30 MG 24 hr tablet TAKE 1/2 TABLET BY MOUTH DAILY  . levothyroxine (SYNTHROID, LEVOTHROID) 112 MCG tablet Take 112 mcg by mouth daily before breakfast.  . loratadine (CLARITIN) 10 MG tablet Take 10 mg by mouth 2 (two) times daily as needed.   . mometasone (NASONEX) 50 MCG/ACT nasal spray Place 2 sprays into the nose daily as needed (allergies).   .Marland Kitchen  nitrofurantoin, macrocrystal-monohydrate, (MACROBID) 100 MG capsule Take 1 capsule (100 mg total) by mouth 2 (two) times daily.  . nitroGLYCERIN (NITROSTAT) 0.4 MG SL tablet Place 0.4 mg under the tongue every 5 (five) minutes as needed.    . polyethylene glycol powder (GLYCOLAX/MIRALAX) powder Take 17 g by mouth daily as needed for moderate constipation.  . propranolol (INDERAL) 10 MG tablet TAKE 1 TABLET (10 MG TOTAL) BY MOUTH 3 (THREE) TIMES DAILY AS NEEDED (TREMORS).  Marland Kitchen sertraline (ZOLOFT) 25 MG tablet TAKE 1 TABLET (25 MG TOTAL) BY MOUTH EVERY  MORNING.   No current facility-administered medications on file prior to visit.    Review of Systems Per HPI unless specifically indicated above     Objective:    BP 110/60 mmHg  Pulse 87  Temp(Src) 97.7 F (36.5 C) (Oral)  SpO2 96%  Wt Readings from Last 3 Encounters:  04/03/15 145 lb (65.772 kg)  01/11/15 150 lb (68.04 kg)  01/09/15 151 lb (68.493 kg)    Physical Exam  Constitutional: She appears well-developed and well-nourished. No distress.  Abdominal: Soft. Bowel sounds are normal. She exhibits no distension and no mass. There is tenderness. There is no rigidity, no rebound, no guarding, no CVA tenderness and negative Murphy's sign.  Musculoskeletal: She exhibits edema (tr pedal edema).  Psychiatric: She has a normal mood and affect.  Nursing note and vitals reviewed.  Results for orders placed or performed in visit on 04/25/15  POCT Urinalysis Dipstick  Result Value Ref Range   Color, UA Yellow    Clarity, UA Cloudy    Glucose, UA Neg    Bilirubin, UA Neg    Ketones, UA Neg    Spec Grav, UA 1.020    Blood, UA 1+    pH, UA 6.5    Protein, UA 1+    Urobilinogen, UA 0.2    Nitrite, UA Neg    Leukocytes, UA large (3+) (A) Negative   Lab Results  Component Value Date   CREATININE 0.76 04/11/2015       Assessment & Plan:   Problem List Items Addressed This Visit    Acute recurrent cystitis - Primary    Recurrent cystitis, after treatment initially with keflex (stopped early 2/2 diarrhea) then 7d course of macrobid. Send UCx, start cipro 232m bid x 1 week. Update if persistent sxs despite this. No red flags today.      Multiple system atrophy    This along with parkinsonism complicates picture If recurrent infections will need referral back to urology (Gaynelle Arabian.       Other Visit Diagnoses    Dysuria        Relevant Orders    POCT Urinalysis Dipstick (Completed)        Follow up plan: Return if symptoms worsen or fail to improve.

## 2015-04-25 NOTE — Assessment & Plan Note (Signed)
Recurrent cystitis, after treatment initially with keflex (stopped early 2/2 diarrhea) then 7d course of macrobid. Send UCx, start cipro 242m bid x 1 week. Update if persistent sxs despite this. No red flags today.

## 2015-04-25 NOTE — Telephone Encounter (Signed)
Pt has appt 04/25/15 at 5:15 pm with Dr Danise Mina.

## 2015-04-26 NOTE — Telephone Encounter (Signed)
LMVM for pt to return call for MRI brain.  (no significant changes were seen, comparing to last scan)

## 2015-04-28 LAB — URINE CULTURE: Colony Count: 50000

## 2015-04-30 NOTE — Telephone Encounter (Signed)
I called pt following up on MRI results.   She did get my message and also saw on my chart the results as per below.  No significant changes from comparison of one done 9/ 2013.

## 2015-05-01 ENCOUNTER — Telehealth (INDEPENDENT_AMBULATORY_CARE_PROVIDER_SITE_OTHER): Payer: Medicare Other

## 2015-05-01 DIAGNOSIS — R350 Frequency of micturition: Secondary | ICD-10-CM | POA: Diagnosis not present

## 2015-05-01 NOTE — Telephone Encounter (Signed)
Pt left v/m; pt was seen 04/25/15 with UTI and will finish Cipro in AM on 05/02/15. Pt said urine still appears dark and pt has some itching. Pt does not have pain or fever. Pt wanted to know if Dr Darnell Level wants to reck pt.pt request cb.

## 2015-05-01 NOTE — Telephone Encounter (Signed)
As long as frequency, urgency and bladder pressure are improved, no need to extend antibiotics. Should improve with time. Update [place] if new sxs develop. Completing 7d course of cipro 247m bid. Lab Results  Component Value Date   CREATININE 0.76 04/11/2015

## 2015-05-02 NOTE — Telephone Encounter (Signed)
Patient's husband notified and will come pick up cup tomorrow.

## 2015-05-02 NOTE — Telephone Encounter (Signed)
Sure see if he can get a sterile urine cup from here first though.

## 2015-05-02 NOTE — Telephone Encounter (Signed)
Spoke with patient's husband. He was asking if he could drop off a urine sample just to check to make sure it has cleared up. She is still going more frequently than normal and it is wearing them both out having to go back and forth with her being in a wheelchair.

## 2015-05-03 ENCOUNTER — Other Ambulatory Visit: Payer: Self-pay | Admitting: Gastroenterology

## 2015-05-03 LAB — POCT URINALYSIS DIPSTICK
Bilirubin, UA: NEGATIVE
Blood, UA: NEGATIVE
GLUCOSE UA: NEGATIVE
Ketones, UA: NEGATIVE
Leukocytes, UA: NEGATIVE
NITRITE UA: NEGATIVE
PH UA: 5.5
Protein, UA: NEGATIVE
Spec Grav, UA: >=1.03
UROBILINOGEN UA: 0.2

## 2015-05-03 NOTE — Telephone Encounter (Signed)
UA completed and results in chart.

## 2015-05-03 NOTE — Addendum Note (Signed)
Addended by: Royann Shivers A on: 05/03/2015 02:26 PM   Modules accepted: Orders

## 2015-05-20 ENCOUNTER — Ambulatory Visit: Payer: Medicare Other | Attending: Neurology | Admitting: Physical Therapy

## 2015-05-20 DIAGNOSIS — R269 Unspecified abnormalities of gait and mobility: Secondary | ICD-10-CM | POA: Diagnosis not present

## 2015-05-20 DIAGNOSIS — M439 Deforming dorsopathy, unspecified: Secondary | ICD-10-CM | POA: Diagnosis present

## 2015-05-20 DIAGNOSIS — R278 Other lack of coordination: Secondary | ICD-10-CM | POA: Insufficient documentation

## 2015-05-20 DIAGNOSIS — R29898 Other symptoms and signs involving the musculoskeletal system: Secondary | ICD-10-CM | POA: Diagnosis present

## 2015-05-21 ENCOUNTER — Encounter: Payer: Self-pay | Admitting: Physical Therapy

## 2015-05-21 NOTE — Therapy (Signed)
[facility] [address] [place], [place], [address] Phone: [phone]   Fax:  [phone]  Physical Therapy Evaluation  Patient Details  Name: [name] [name] MRN: [id number] Date of Birth: [birth year] Referring Provider:  Star Age, MD  Encounter Date: 05/20/2015      PT End of Session - 05/21/15 2015    Visit Number 1   Number of Visits 17   Date for PT Re-Evaluation 03/03/15   Authorization Type UHC MCR   Authorization Time Period 05-20-15 - 07-19-15   PT Start Time 1400   PT Stop Time 1445   PT Time Calculation (min) 45 min   Equipment Utilized During Treatment Gait belt      Past Medical History  Diagnosis Date  . GERD (gastroesophageal reflux disease)     s/p nissen  . Familial tremor     followed by Dr. Erling Cruz  . Unspecified chronic bronchitis   . Irritable bladder   . History of pneumonia   . Hypothyroidism   . Depression   . Asthma   . CAD (coronary artery disease)     myoview 5/08:  EF 76%, no scar, no ischemia, +ECG changes with exercise;  cath 04/08/07:  pLAD 30%, mLAD 60-70% - med tx.  Marland Kitchen HLD (hyperlipidemia)   . Multiple system atrophy     [name]  . VAGINITIS, ATROPHIC 11/07/2007  . Osteoporosis 2016    DEXA T-4.4 spine deteriorated since 2012    Past Surgical History  Procedure Laterality Date  . Laparoscopic nissen fundoplication    . Cystectomy    . Tumor removal    . Tubal ligation    . Colonoscopy N/A 08/29/2013    int hemm; Inda Castle, MD  . Dexa  12/2014    T -4.4 spine  . Esophagogastroduodenoscopy N/A 01/09/2015    Procedure: ESOPHAGOGASTRODUODENOSCOPY (EGD);  Surgeon: Inda Castle, MD;  Location: [facility];  Service: Endoscopy;  Laterality: N/A;  help with transfers    There were no vitals filed for this visit.  Visit Diagnosis:  Abnormality of gait - Plan: PT plan of care cert/re-cert  Incoordination of extremity - Plan: PT plan of care  cert/re-cert  Right leg weakness - Plan: PT plan of care cert/re-cert      Subjective Assessment - 05/21/15 1715    Subjective Pt. reports having had UTI on 04-11-15 requiring an ED visit; had to call ambulance due to severity of weakness; pt. was last seen at this facility for PT at end of April of this year and had a follow up appt with Dr. Rexene Alberts in mid May. Pt. was discharged due to plateau in maximizing functional progress at that time.  Pt. states that she is now experiencing more episodes of freezing, especially noted with RLE and reports problems with step initiation.                                                  Pertinent History Multiple Systems atrophy diagnosed in May 2014 with initial appt. at [facility] clinic in Aug. 2013;Pt. had UTI on 04-11-15; went to ED - received catherization; states she has had increased weakness and some mild decline in mobility since this problem   Patient Stated Goals improve walking and mobility   Currently in Pain? No/denies  [facility] PT Assessment - 05/21/15 0001    Assessment   Medical Diagnosis Multiple systems atrophy   Onset Date/Surgical Date 04/11/15  initally diagnosed in Aug. 2013; May 2014 confirmed diagnosi   Next MD Visit Aug. 2016   Prior Therapy pt. had OP PT  for 2 months  in summer 2015   Precautions   Precautions Fall   Precaution Comments Pt. requires use of RW and assist. with transfers and ambulation   Balance Screen   Has the patient fallen in the past 6 months No   How many times? 4   Has the patient had a decrease in activity level because of a fear of falling?  Yes   Is the patient reluctant to leave their home because of a fear of falling?  No   Home Environment   Living Environment Private residence   Living Arrangements Spouse/significant other   Type of [place] entrance  back entrance   [place] One level   Prior Function   Level of Independence Needs assistance with ADLs;Needs  assistance with gait;Needs assistance with transfers;Needs assistance with homemaking   Posture/Postural Control   Postural Limitations --  head is laterally flexed to left side   ROM / Strength   AROM / PROM / Strength AROM  involuntary movement (dyskinesia) in RLE/foot and ankle   Ambulation/Gait   Ambulation/Gait Yes   Ambulation/Gait Assistance 4: Min assist   Ambulation/Gait Assistance Details cues for weight shift with freezing episodes; cues to stand upright   Ambulation Distance (Feet) 75 Feet  in 3" walk test   Assistive device Rolling walker   Gait Pattern Trunk flexed;Wide base of support;Festinating;Ataxic;Decreased step length - left;Decreased step length - right;Decreased stride length;Decreased hip/knee flexion - right   Gait velocity .29 ft/sec  1" 53 secs with RW   Timed Up and Go Test   Normal TUG (seconds) 56.22  with RW   RLE Tone   RLE Tone Hypertonic                             PT Short Term Goals - 05/21/15 2025    PT SHORT TERM GOAL #1   Title Pt. will increase standing tolerance so she is able to report standing 5" at home with S for independence with ADL's. (06-19-15)   Time 4   Period Weeks   Status New   PT SHORT TERM GOAL #2   Title Increase distance in 3" walk test to >/= 100' for increased amb. speed and endurance.  (06-19-15)   Baseline 75' in 3" on 05-20-15   Period Weeks   Status New   PT SHORT TERM GOAL #3   Title Transfer sit to stand from wheelchair with min assist   (06-19-15)   Baseline mod assist   Time 4   Period --   Status --   PT SHORT TERM GOAL #4   Title Independent in updated HEP (06-19-15)   Time 4   Period Weeks   Status New   PT SHORT TERM GOAL #5   Title Improve TUG score to </= 50 secs with RW with CGA (06-19-15)   Baseline 56.22   Time 4   Period --   Status --           PT Long Term Goals - 05/21/15 2034    PT LONG TERM GOAL #1   Title Pt. will  ambulate 240' nonstop with RW to demo incr.  endurance with CGA  (07-19-15)   Period Weeks   Status New   PT LONG TERM GOAL #2   Title Report ability to stand at least 10" at home for improved standing tolerance   Period Weeks   Status New   PT LONG TERM GOAL #3   Title Pt. will transfer sit to to stand 5 times with UE support prn from mat with CGA to demo improved initial standing balance (07-19-15)   Time 8   Period Weeks   Status New   PT LONG TERM GOAL #4   Title Perform at least 15" on Nustep to demo improved activity tolerance/endurance  (07-19-15)   Time 8   Period Weeks   Status New   PT LONG TERM GOAL #5   Title Pt. will demo technique to assist with step initiation after freezing episode  (07-19-15)   Time 8   Period Weeks   Status New   PT LONG TERM GOAL #6   Title Improve TUG score to </=48 secs. with RW with CGA.   Baseline 56.22 secs wtih RW   Time 8   Period --   Status --               Plan - 05/21/15 2015-01-10    Clinical Impression Statement Pt. presents with significant mobility deficits due to MSA; pt. has freezing with gait, especially with RLE and has difficulty with sit to stand transfers, leaning posteriorly   Pt will benefit from skilled therapeutic intervention in order to improve on the following deficits Abnormal gait;Decreased coordination;Impaired flexibility;Difficulty walking;Decreased balance;Increased muscle spasms;Impaired tone;Decreased mobility;Decreased strength;Decreased activity tolerance;Decreased range of motion   Rehab Potential Fair   PT Frequency 2x / week   PT Duration 8 weeks   PT Treatment/Interventions ADLs/Self Care Home Management;Therapeutic activities;Patient/family education;Therapeutic exercise;Gait training;Balance training;Stair training;Neuromuscular re-education;Functional mobility training   PT Next Visit Plan Review balance HEP as previously instructed and modify as needed; review stretches and confirm pt is including in HEP; balance and gait training   PT Home  Exercise Plan balance and stretching   Consulted and Agree with Plan of Care Patient          G-Codes - 27-May-2015 2039/01/10    Functional Assessment Tool Used --   Functional Limitation --   Mobility: Walking and Moving Around Current Status (B1517) --   Mobility: Walking and Moving Around Goal Status (O1607) --       Problem List Patient Active Problem List   Diagnosis Date Noted  . Acute recurrent cystitis 04/18/2015  . Pedal edema 04/01/2015  . Dyspepsia 01/09/2015  . Constipation 12/14/2014  . Medicare annual wellness visit, subsequent 11/27/2014  . Advanced care planning/counseling discussion 11/27/2014  . Osteoporosis   . B-complex deficiency 06/07/2014  . Recurrent falls 02/06/2014  . Multiple system atrophy   . Dysphagia, unspecified(787.20) 07/28/2013  . Lower extremity edema 09/26/2012  . Shoulder pain, left 07/20/2012  . 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 Routine health maintenance 08/26/2011  . Murmur 05/27/2011  . HYPERCHOLESTEROLEMIA  IIA 05/14/2010  . VENOUS INSUFFICIENCY, LEGS 06/21/2009  . CORONARY HEART DISEASE 06/09/2008  . Hypothyroidism 05/15/2008  . ASTHMA 05/15/2008  . Depression 11/07/2007  . 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 GERD 11/07/2007  . VAGINITIS, ATROPHIC 11/07/2007    Alda Lea, PT 05/21/2015, 8:48 PM  [facility] [address] [place], [place], [address] Phone: [phone]  Fax:  [phone]

## 2015-05-28 ENCOUNTER — Other Ambulatory Visit: Payer: Self-pay | Admitting: Gastroenterology

## 2015-05-29 ENCOUNTER — Ambulatory Visit: Payer: Medicare Other | Admitting: Physical Therapy

## 2015-05-29 DIAGNOSIS — R269 Unspecified abnormalities of gait and mobility: Secondary | ICD-10-CM

## 2015-05-29 DIAGNOSIS — M439 Deforming dorsopathy, unspecified: Secondary | ICD-10-CM

## 2015-05-29 DIAGNOSIS — R278 Other lack of coordination: Secondary | ICD-10-CM

## 2015-05-29 DIAGNOSIS — R29898 Other symptoms and signs involving the musculoskeletal system: Secondary | ICD-10-CM

## 2015-05-29 NOTE — Therapy (Signed)
[facility] [address] [place], [place], [address] Phone: [phone]   Fax:  [phone]  Physical Therapy Treatment  Patient Details  Name: [name] [name] MRN: [id number] Date of Birth: Sep 12, 1941 Referring Provider:  Ria Bush, MD  Encounter Date: 05/29/2015      PT End of Session - 05/29/15 2023    Visit Number 2   Number of Visits 17   Date for PT Re-Evaluation 03/03/15   Authorization Type UHC MCR   Authorization Time Period 05-20-15 - 07-19-15   PT Start Time 1402   PT Stop Time 1450   PT Time Calculation (min) 48 min   Equipment Utilized During Treatment Gait belt   Activity Tolerance Patient tolerated treatment well   Behavior During Therapy [facility] for tasks assessed/performed      Past Medical History  Diagnosis Date  . GERD (gastroesophageal reflux disease)     s/p nissen  . Familial tremor     followed by Dr. Erling Cruz  . Unspecified chronic bronchitis   . Irritable bladder   . History of pneumonia   . Hypothyroidism   . Depression   . Asthma   . CAD (coronary artery disease)     myoview 5/08:  EF 76%, no scar, no ischemia, +ECG changes with exercise;  cath 04/08/07:  pLAD 30%, mLAD 60-70% - med tx.  Marland Kitchen HLD (hyperlipidemia)   . Multiple system atrophy     [name]  . VAGINITIS, ATROPHIC 11/07/2007  . Osteoporosis 2016    DEXA T-4.4 spine deteriorated since 2012    Past Surgical History  Procedure Laterality Date  . Laparoscopic nissen fundoplication    . Cystectomy    . Tumor removal    . Tubal ligation    . Colonoscopy N/A 08/29/2013    int hemm; Inda Castle, MD  . Dexa  12/2014    T -4.4 spine  . Esophagogastroduodenoscopy N/A 01/09/2015    Procedure: ESOPHAGOGASTRODUODENOSCOPY (EGD);  Surgeon: Inda Castle, MD;  Location: [facility];  Service: Endoscopy;  Laterality: N/A;  help with transfers    There were no vitals filed for this visit.  Visit Diagnosis:   Abnormality of gait  Incoordination of extremity  Right leg weakness  Postural deformity      Subjective Assessment - 05/29/15 1407    Subjective "The last 4 falls I had, I hit my head. So my doctor said my husband should always help me and not to let go of me when I walk."Pt described intermittent generalized pain in "pretty much my whole trunk, my arms, my legs when I get stiff," per pt.   Pertinent History Multiple Systems atrophy diagnosed in May 2014 with initial appt. at [facility] clinic in Aug. 2013;Pt. had UTI on 04-11-15; went to ED - received catherization; states she has had increased weakness and some mild decline in mobility since this problem   Patient Stated Goals improve walking and mobility   Currently in Pain? No/denies        Neuro Re-ed: - Instructed pt in home exercises. See Pt Instructions for details. Noted pt difficulty initiating R hip flexion in seated; however, pt able to consistently perform R hip/knee flexion in seated in the context of reciprocal marching. After performing reciprocal marching, pt better able to initiate R hip flexion.                   [facility] PT Treatment/Exercise - 05/29/15 0001  Transfers   Transfers Sit to Stand;Stand to [employer] Transfers  with RW   Sit to Stand 3: Mod assist   Sit to Stand Details (indicate cue type and reason) manual facilitation of lateral weight shift during pivoting to initiate LE advancement; cueing to prevent posterior LOB   Ambulation/Gait   Ambulation/Gait Yes   Ambulation/Gait Assistance 3: Mod assist  with w/c follow   Ambulation/Gait Assistance Details tactile cueing for anterior/lateral weight shifting, visual cueing (Tband on front bars of RW) to increase B step length, R hip/knee flexion   Ambulation Distance (Feet) 30 Feet  20' then 10'   Assistive device Rolling walker   Gait Pattern Step-to pattern;Right circumduction;Festinating;Wide base of support;Decreased hip/knee flexion  - right;Decreased hip/knee flexion - left;Decreased stride length;Shuffle   Posture/Postural Control   Posture/Postural Control Postural limitations   Postural Limitations Increased thoracic kyphosis;Forward head;Rounded Shoulders   Posture Comments passive elongation of trunk on L; cervical spine in R rotation, L lateral flexion at rest                PT Education - 05/29/15 2026    Education provided Yes   Education Details Established HEP.   Person(s) Educated Patient   Methods Explanation;Demonstration;Handout   Comprehension Verbalized understanding;Returned demonstration          PT Short Term Goals - 05/29/15 2030    PT SHORT TERM GOAL #1   Title Pt. will increase standing tolerance so she is able to report standing 5" at home with S for independence with ADL's. (06-19-15)   Time 4   Period Weeks   Status On-going   PT SHORT TERM GOAL #2   Title Increase distance in 3" walk test to >/= 100' for increased amb. speed and endurance.  (06-19-15)   Baseline 75' in 3" on 05-20-15   Period Weeks   Status On-going   PT SHORT TERM GOAL #3   Title Transfer sit to stand from wheelchair with min assist   (06-19-15)   Baseline mod assist   Time 4   Status On-going   PT SHORT TERM GOAL #4   Title Independent in updated HEP (06-19-15)   Time 4   Period Weeks   Status On-going   PT SHORT TERM GOAL #5   Title Improve TUG score to </= 50 secs with RW with CGA (06-19-15)   Baseline 56.22   Time 4   Status On-going           PT Long Term Goals - 05/29/15 2031    PT LONG TERM GOAL #1   Title Pt. will ambulate 240' nonstop with RW to demo incr. endurance with CGA  (07-19-15)   Period Weeks   Status On-going   PT LONG TERM GOAL #2   Title Report ability to stand at least 10" at home for improved standing tolerance   Period Weeks   Status On-going   PT LONG TERM GOAL #3   Title Pt. will transfer sit to to stand 5 times with UE support prn from mat with CGA to demo improved  initial standing balance (07-19-15)   Time 8   Period Weeks   Status On-going   PT LONG TERM GOAL #4   Title Perform at least 15" on Nustep to demo improved activity tolerance/endurance  (07-19-15)   Time 8   Period Weeks   Status On-going   PT LONG TERM GOAL #5   Title Pt. will demo technique to assist with  step initiation after freezing episode  (07-19-15)   Time 8   Period Weeks   Status On-going   PT LONG TERM GOAL #6   Title Improve TUG score to </=48 secs. with RW with CGA.   Baseline 56.22 secs wtih RW   Time 8   Status On-going               Plan - 05/29/15 2025    Clinical Impression Statement Session focused on establishing HEP to address postural and mobility impairments; gait/transfer training. Continue per POC.   Pt will benefit from skilled therapeutic intervention in order to improve on the following deficits Abnormal gait;Decreased coordination;Impaired flexibility;Difficulty walking;Decreased balance;Increased muscle spasms;Impaired tone;Decreased mobility;Decreased strength;Decreased activity tolerance;Decreased range of motion   Rehab Potential Fair   PT Frequency 2x / week   PT Duration 8 weeks   PT Treatment/Interventions ADLs/Self Care Home Management;Therapeutic activities;Patient/family education;Therapeutic exercise;Gait training;Balance training;Stair training;Neuromuscular re-education;Functional mobility training   PT Next Visit Plan Review stretches and confirm pt is including in HEP; balance and gait training. Propping on L forearm for L trunk elongation, R shortening.   PT Home Exercise Plan balance and stretching   Consulted and Agree with Plan of Care Patient        Problem List Patient Active Problem List   Diagnosis Date Noted  . Acute recurrent cystitis 04/18/2015  . Pedal edema 04/01/2015  . Dyspepsia 01/09/2015  . Constipation 12/14/2014  . Medicare annual wellness visit, subsequent 11/27/2014  . Advanced care planning/counseling  discussion 11/27/2014  . Osteoporosis   . B-complex deficiency 06/07/2014  . Recurrent falls 02/06/2014  . Multiple system atrophy   . Dysphagia, unspecified(787.20) 07/28/2013  . Lower extremity edema 09/26/2012  . Shoulder pain, left 07/20/2012  . 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 Routine health maintenance 08/26/2011  . Murmur 05/27/2011  . HYPERCHOLESTEROLEMIA  IIA 05/14/2010  . VENOUS INSUFFICIENCY, LEGS 06/21/2009  . CORONARY HEART DISEASE 06/09/2008  . Hypothyroidism 05/15/2008  . ASTHMA 05/15/2008  . Depression 11/07/2007  . 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 GERD 11/07/2007  . VAGINITIS, ATROPHIC 11/07/2007    Billie Ruddy, PT, DPT [facility] [address] [place], [place], [address] Phone: [phone]   Fax:  [phone] 05/29/2015, 8:46 PM

## 2015-05-29 NOTE — Patient Instructions (Signed)
KNEE: Extension, Long Arc Quads - Sitting   Raise leg until knee is straight. 10 reps per set, 1 sets per day, 2 times per day.  Copyright  VHI. All rights reserved.  FLEXION: Sitting (Active)   Sit, both feet flat. Lift right knee then left knee toward the ceiling as though you're marching. Do 20 marches total. Then, switch to raising up your right knee only. Complete 10 repetitions. Perform 2 sessions per day.  Copyright  VHI. All rights reserved.  Strengthening: Quadriceps Set   Tighten muscles on top of thighs by pushing knees down into surface. Hold 5 seconds. Repeat 10 times per set. Do 1 sets per session. Do 2 sessions per day.  AROM: Neck Rotation   Turn head slowly to look over your left shoulder as far as you can without pain. Hold for 5 seconds. Relax. Repeat 10 times. Do this twice per day.

## 2015-05-31 ENCOUNTER — Ambulatory Visit: Payer: Medicare Other | Admitting: Physical Therapy

## 2015-05-31 DIAGNOSIS — M439 Deforming dorsopathy, unspecified: Secondary | ICD-10-CM

## 2015-05-31 DIAGNOSIS — R269 Unspecified abnormalities of gait and mobility: Secondary | ICD-10-CM

## 2015-05-31 DIAGNOSIS — R278 Other lack of coordination: Secondary | ICD-10-CM

## 2015-05-31 DIAGNOSIS — R29898 Other symptoms and signs involving the musculoskeletal system: Secondary | ICD-10-CM

## 2015-05-31 NOTE — Therapy (Signed)
[facility] [address] [place], [place], [address] Phone: [phone]   Fax:  [phone]  Physical Therapy Treatment  Patient Details  Name: [name] [name] MRN: [id number] Date of Birth: 13-Sep-1941 Referring Provider:  Ria Bush, MD  Encounter Date: 05/31/2015      PT End of Session - 05/31/15 1635    Visit Number 3   Number of Visits 17   Date for PT Re-Evaluation 07/19/15   Authorization Type U[employer] [facility]   Authorization Time Period 05-20-15 - 07-19-15   PT Start Time 1532   PT Stop Time 1617   PT Time Calculation (min) 45 min   Equipment Utilized During Treatment Gait belt   Activity Tolerance Patient tolerated treatment well   Behavior During Therapy [facility] for tasks assessed/performed      Past Medical History  Diagnosis Date  . GERD (gastroesophageal reflux disease)     s/p nissen  . Familial tremor     followed by Dr. Erling Cruz  . Unspecified chronic bronchitis   . Irritable bladder   . History of pneumonia   . Hypothyroidism   . Depression   . Asthma   . CAD (coronary artery disease)     myoview 5/08:  EF 76%, no scar, no ischemia, +ECG changes with exercise;  cath 04/08/07:  pLAD 30%, mLAD 60-70% - med tx.  Marland Kitchen HLD (hyperlipidemia)   . Multiple system atrophy     [name]  . VAGINITIS, ATROPHIC 11/07/2007  . Osteoporosis 2016    DEXA T-4.4 spine deteriorated since 2012    Past Surgical History  Procedure Laterality Date  . Laparoscopic nissen fundoplication    . Cystectomy    . Tumor removal    . Tubal ligation    . Colonoscopy N/A 08/29/2013    int hemm; Inda Castle, MD  . Dexa  12/2014    T -4.4 spine  . Esophagogastroduodenoscopy N/A 01/09/2015    Procedure: ESOPHAGOGASTRODUODENOSCOPY (EGD);  Surgeon: Inda Castle, MD;  Location: [facility];  Service: Endoscopy;  Laterality: N/A;  help with transfers    There were no vitals filed for this visit.  Visit Diagnosis:   Abnormality of gait  Incoordination of extremity  Right leg weakness  Postural deformity      Subjective Assessment - 05/31/15 1633    Subjective "I've been practicing my marches." Pt reports no falls, no significant changes.   Pertinent History Multiple Systems atrophy diagnosed in May 2014 with initial appt. at [facility] clinic in Aug. 2013;Pt. had UTI on 04-11-15; went to ED - received catherization; states she has had increased weakness and some mild decline in mobility since this problem   Patient Stated Goals improve walking and mobility   Currently in Pain? No/denies                         [facility] Adult PT Treatment/Exercise - 05/31/15 0001    Transfers   Transfers Sit to Stand;Stand to Sit   Transfers Sit to Stand   Sit to Stand 4: Min guard;4: Min assist   Sit to Stand Details (indicate cue type and reason) tactile cueing at R knee to maintain contact with floor   Ambulation/Gait   Ambulation/Gait Yes   Ambulation/Gait Assistance 1: +2 Total assist;3: Mod assist;4: Min assist   Ambulation/Gait Assistance Details Mod A for stability/balance. +2A  for upright posture   Ambulation Distance (Feet) 272 Feet  x62' with +2A then x216 with RW and Min A   Assistive device Other (Comment);Rolling walker  Pt placed hands on therapist's shoulders   Gait Pattern Step-to pattern;Right circumduction;Festinating;Wide base of support;Decreased hip/knee flexion - right;Decreased hip/knee flexion - left;Decreased stride length;Shuffle   Ambulation Surface Level;Indoor   Gait Comments During gait trial, pt required tactile cueing for lateral weight shift, verbal/visual cueing for increased B step length, and verbal reminders for safe proximity to RW.   Posture/Postural Control   Posture/Postural Control Postural limitations   Postural Limitations Increased thoracic kyphosis;Forward head;Rounded Shoulders   Posture Comments passive elongation of trunk on L; cervical spine in R  rotation, L lateral flexion at rest   Therapeutic Activites    Therapeutic Activities --   Neuro Re-ed    Neuro Re-ed Details  Pt performed the following PWR! exercises in seated: rock-reach forward for full anterior weight shift with sit > stand 2 x10 reps; twice 3 x5 reps per side for increased axial mobility. Transition from seated <> R forearm propping 2 x5 reps for R trunk elongation, L trunk shortening.   Knee/Hip Exercises: Aerobic   Stationary Bike NuStep level 1.5 x6 minutes with BUE/LE's                  PT Short Term Goals - 05/29/15 2030    PT SHORT TERM GOAL #1   Title Pt. will increase standing tolerance so she is able to report standing 5" at home with S for independence with ADL's. (06-19-15)   Time 4   Period Weeks   Status On-going   PT SHORT TERM GOAL #2   Title Increase distance in 3" walk test to >/= 100' for increased amb. speed and endurance.  (06-19-15)   Baseline 75' in 3" on 05-20-15   Period Weeks   Status On-going   PT SHORT TERM GOAL #3   Title Transfer sit to stand from wheelchair with min assist   (06-19-15)   Baseline mod assist   Time 4   Status On-going   PT SHORT TERM GOAL #4   Title Independent in updated HEP (06-19-15)   Time 4   Period Weeks   Status On-going   PT SHORT TERM GOAL #5   Title Improve TUG score to </= 50 secs with RW with CGA (06-19-15)   Baseline 56.22   Time 4   Status On-going           PT Long Term Goals - 05/29/15 2031    PT LONG TERM GOAL #1   Title Pt. will ambulate 240' nonstop with RW to demo incr. endurance with CGA  (07-19-15)   Period Weeks   Status On-going   PT LONG TERM GOAL #2   Title Report ability to stand at least 10" at home for improved standing tolerance   Period Weeks   Status On-going   PT LONG TERM GOAL #3   Title Pt. will transfer sit to to stand 5 times with UE support prn from mat with CGA to demo improved initial standing balance (07-19-15)   Time 8   Period Weeks   Status On-going    PT LONG TERM GOAL #4   Title Perform at least 15" on Nustep to demo improved activity tolerance/endurance  (07-19-15)   Time 8   Period Weeks   Status On-going   PT LONG TERM GOAL #5   Title Pt. will demo technique to assist with step initiation after freezing episode  (  07-19-15)   Time 8   Period Weeks   Status On-going   PT LONG TERM GOAL #6   Title Improve TUG score to </=48 secs. with RW with CGA.   Baseline 56.22 secs wtih RW   Time 8   Status On-going               Plan - 05/31/15 1636    Clinical Impression Statement During initial gait trial x56', pt with festinating a gait and freezing; +2A provided for pt to place BUE's on another therapist's shoulders (positioned in front of pt) to promote upright posture. After pt performed NuStep and NMR, pt ambulated x216' consecutively with RW and min guard to min A. Continue per POC.   Pt will benefit from skilled therapeutic intervention in order to improve on the following deficits Abnormal gait;Decreased coordination;Impaired flexibility;Difficulty walking;Decreased balance;Increased muscle spasms;Impaired tone;Decreased mobility;Decreased strength;Decreased activity tolerance;Decreased range of motion   Rehab Potential Fair   PT Frequency 2x / week   PT Duration 8 weeks   PT Treatment/Interventions ADLs/Self Care Home Management;Therapeutic activities;Patient/family education;Therapeutic exercise;Gait training;Balance training;Stair training;Neuromuscular re-education;Functional mobility training   PT Next Visit Plan Review stretches and confirm pt is including in HEP; balance and gait training.    PT Home Exercise Plan balance and stretching   Consulted and Agree with Plan of Care Patient        Problem List Patient Active Problem List   Diagnosis Date Noted  . Acute recurrent cystitis 04/18/2015  . Pedal edema 04/01/2015  . Dyspepsia 01/09/2015  . Constipation 12/14/2014  . Medicare annual wellness visit, subsequent  11/27/2014  . Advanced care planning/counseling discussion 11/27/2014  . Osteoporosis   . B-complex deficiency 06/07/2014  . Recurrent falls 02/06/2014  . Multiple system atrophy   . Dysphagia, unspecified(787.20) 07/28/2013  . Lower extremity edema 09/26/2012  . Shoulder pain, left 07/20/2012  . 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 Routine health maintenance 08/26/2011  . Murmur 05/27/2011  . HYPERCHOLESTEROLEMIA  IIA 05/14/2010  . VENOUS INSUFFICIENCY, LEGS 06/21/2009  . CORONARY HEART DISEASE 06/09/2008  . Hypothyroidism 05/15/2008  . ASTHMA 05/15/2008  . Depression 11/07/2007  . 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 GERD 11/07/2007  . VAGINITIS, ATROPHIC 11/07/2007    Billie Ruddy, PT, DPT [facility] [address] [place], [place], [address] Phone: [phone]   Fax:  [phone] 05/31/2015, 4:43 PM

## 2015-06-04 ENCOUNTER — Ambulatory Visit: Payer: Medicare Other | Admitting: Physical Therapy

## 2015-06-04 DIAGNOSIS — R278 Other lack of coordination: Secondary | ICD-10-CM

## 2015-06-04 DIAGNOSIS — R269 Unspecified abnormalities of gait and mobility: Secondary | ICD-10-CM

## 2015-06-04 DIAGNOSIS — M439 Deforming dorsopathy, unspecified: Secondary | ICD-10-CM

## 2015-06-04 NOTE — Therapy (Signed)
[facility] [address] [place], [place], [address] Phone: [phone]   Fax:  [phone]  Physical Therapy Treatment  Patient Details  Name: [name] [name] MRN: [id number] Date of Birth: 11/07/1941 Referring Provider:  Ria Bush, MD  Encounter Date: 06/04/2015      PT End of Session - 06/04/15 1629    Visit Number 4   Number of Visits 17   Date for PT Re-Evaluation 07/19/15   Authorization Type [employer] MCR   Authorization Time Period 05-20-15 - 07-19-15   PT Start Time 1532   PT Stop Time 1622   PT Time Calculation (min) 50 min   Equipment Utilized During Treatment Gait belt   Activity Tolerance Patient tolerated treatment well   Behavior During Therapy [facility] for tasks assessed/performed      Past Medical History  Diagnosis Date  . GERD (gastroesophageal reflux disease)     s/p nissen  . Familial tremor     followed by Dr. Erling Cruz  . Unspecified chronic bronchitis   . Irritable bladder   . History of pneumonia   . Hypothyroidism   . Depression   . Asthma   . CAD (coronary artery disease)     myoview 5/08:  EF 76%, no scar, no ischemia, +ECG changes with exercise;  cath 04/08/07:  pLAD 30%, mLAD 60-70% - med tx.  Marland Kitchen HLD (hyperlipidemia)   . Multiple system atrophy     [name]  . VAGINITIS, ATROPHIC 11/07/2007  . Osteoporosis 2016    DEXA T-4.4 spine deteriorated since 2012    Past Surgical History  Procedure Laterality Date  . Laparoscopic nissen fundoplication    . Cystectomy    . Tumor removal    . Tubal ligation    . Colonoscopy N/A 08/29/2013    int hemm; Inda Castle, MD  . Dexa  12/2014    T -4.4 spine  . Esophagogastroduodenoscopy N/A 01/09/2015    Procedure: ESOPHAGOGASTRODUODENOSCOPY (EGD);  Surgeon: Inda Castle, MD;  Location: [facility];  Service: Endoscopy;  Laterality: N/A;  help with transfers    There were no vitals filed for this visit.  Visit Diagnosis:   Abnormality of gait  Incoordination of extremity  Postural deformity      Subjective Assessment - 06/04/15 1627    Subjective "My tremors are much worse today. I had to take some medicine for my stomach - because it's bothering me - so I had to wait until later to take my other medicines." No falls.   Pertinent History Multiple Systems atrophy diagnosed in May 2014 with initial appt. at [facility] clinic in Aug. 2013;Pt. had UTI on 04-11-15; went to ED - received catherization; states she has had increased weakness and some mild decline in mobility since this problem   Patient Stated Goals improve walking and mobility   Currently in Pain? No/denies                         [facility] Adult PT Treatment/Exercise - 06/04/15 1633    Bed Mobility   Bed Mobility Supine to Sit;Scooting to [facility];Sit to Supine   Supine to Sit 2: Max assist;1: +1 Total assist  Max A to L side; Total to R side   Sit to Supine 1: +1 Total assist   Scooting to [facility] 3: Mod assist   Scooting to [facility] Details (indicate cue type and reason) tactile cueing at R knee for  WB, manual stabiilization of RLE on mat table   Transfers   Transfers Sit to Stand;Stand to Sit   Transfers Sit to Stand   Sit to Stand 4: Min guard;4: Min assist;3: Mod assist   Sit to Stand Details (indicate cue type and reason) Pt initially required mod A, manual facilitation of anterior weight shift for sit > stand from mat table to RW   Ambulation/Gait   Ambulation/Gait Yes   Ambulation/Gait Assistance 4: Min assist;3: Mod assist   Ambulation/Gait Assistance Details Min A during initial triall; during subsequent trial, provided mod A, tactile cueing for anterior/lateral weight shifting, and manual advancement of RW   Ambulation Distance (Feet) 245 Feet  115' then 130'   Assistive device Rolling walker   Gait Pattern Step-to pattern;Right circumduction;Festinating;Wide base of support;Decreased hip/knee flexion - right;Decreased hip/knee flexion -  left;Decreased stride length   Ambulation Surface Level;Indoor   Gait Comments Pt with improved proximity to RW during this session (no cueing required). Cueing was provided for increased B step length, increased B hip/knee flexion (focus on RLE)   Posture/Postural Control   Posture/Postural Control Postural limitations   Postural Limitations Increased thoracic kyphosis;Forward head;Rounded Shoulders   Posture Comments passive elongation of trunk on L; cervical spine in R rotation, L lateral flexion at rest   Neuro Re-ed    Neuro Re-ed Details  Pt performed the following PWR! exercises in seated: rock-reach forward for full anterior weight shift with sit > stand x10 reps with tactile cueing at R knee for increased WB (due to pt difficulty controlling RLE movement); supine twist x10 reps per side for increased axial mobillity   Knee/Hip Exercises: Stretches   Hip Flexor Stretch 60 seconds;Right;Left;Other (comment)  x3 per side   Knee/Hip Exercises: Aerobic   Stationary Bike NuStep level 1.6 x7 minutes with BUE/LE's                  PT Short Term Goals - 05/29/15 2030    PT SHORT TERM GOAL #1   Title Pt. will increase standing tolerance so she is able to report standing 5" at home with S for independence with ADL's. (06-19-15)   Time 4   Period Weeks   Status On-going   PT SHORT TERM GOAL #2   Title Increase distance in 3" walk test to >/= 100' for increased amb. speed and endurance.  (06-19-15)   Baseline 75' in 3" on 05-20-15   Period Weeks   Status On-going   PT SHORT TERM GOAL #3   Title Transfer sit to stand from wheelchair with min assist   (06-19-15)   Baseline mod assist   Time 4   Status On-going   PT SHORT TERM GOAL #4   Title Independent in updated HEP (06-19-15)   Time 4   Period Weeks   Status On-going   PT SHORT TERM GOAL #5   Title Improve TUG score to </= 50 secs with RW with CGA (06-19-15)   Baseline 56.22   Time 4   Status On-going           PT  Long Term Goals - 05/29/15 2031    PT LONG TERM GOAL #1   Title Pt. will ambulate 240' nonstop with RW to demo incr. endurance with CGA  (07-19-15)   Period Weeks   Status On-going   PT LONG TERM GOAL #2   Title Report ability to stand at least 10" at home for improved standing tolerance  Period Weeks   Status On-going   PT LONG TERM GOAL #3   Title Pt. will transfer sit to to stand 5 times with UE support prn from mat with CGA to demo improved initial standing balance (07-19-15)   Time 8   Period Weeks   Status On-going   PT LONG TERM GOAL #4   Title Perform at least 15" on Nustep to demo improved activity tolerance/endurance  (07-19-15)   Time 8   Period Weeks   Status On-going   PT LONG TERM GOAL #5   Title Pt. will demo technique to assist with step initiation after freezing episode  (07-19-15)   Time 8   Period Weeks   Status On-going   PT LONG TERM GOAL #6   Title Improve TUG score to </=48 secs. with RW with CGA.   Baseline 56.22 secs wtih RW   Time 8   Status On-going               Plan - 06/04/15 1630    Clinical Impression Statement Focused on increasing extensibility of B hip flexors to improve postural alignment during standing/ambulation, prevent contractures. Performed bed mobility for axial mobility, bridging to scoot for closed chain hip strengthening. Mobility continues to be limited by decreased control on RLE movement. Continue per POC.   Pt will benefit from skilled therapeutic intervention in order to improve on the following deficits Abnormal gait;Decreased coordination;Impaired flexibility;Difficulty walking;Decreased balance;Increased muscle spasms;Impaired tone;Decreased mobility;Decreased strength;Decreased activity tolerance;Decreased range of motion   Rehab Potential Fair   PT Frequency 2x / week   PT Duration 8 weeks   PT Treatment/Interventions ADLs/Self Care Home Management;Therapeutic activities;Patient/family education;Therapeutic exercise;Gait  training;Balance training;Stair training;Neuromuscular re-education;Functional mobility training   PT Next Visit Plan Balance and gait training.    PT Home Exercise Plan Consider Axial Mobility Program.   Consulted and Agree with Plan of Care Patient        Problem List Patient Active Problem List   Diagnosis Date Noted  . Acute recurrent cystitis 04/18/2015  . Pedal edema 04/01/2015  . Dyspepsia 01/09/2015  . Constipation 12/14/2014  . Medicare annual wellness visit, subsequent 11/27/2014  . Advanced care planning/counseling discussion 11/27/2014  . Osteoporosis   . B-complex deficiency 06/07/2014  . Recurrent falls 02/06/2014  . Multiple system atrophy   . Dysphagia, unspecified(787.20) 07/28/2013  . Lower extremity edema 09/26/2012  . Shoulder pain, left 07/20/2012  . 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 Routine health maintenance 08/26/2011  . Murmur 05/27/2011  . HYPERCHOLESTEROLEMIA  IIA 05/14/2010  . VENOUS INSUFFICIENCY, LEGS 06/21/2009  . CORONARY HEART DISEASE 06/09/2008  . Hypothyroidism 05/15/2008  . ASTHMA 05/15/2008  . Depression 11/07/2007  . 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 GERD 11/07/2007  . VAGINITIS, ATROPHIC 11/07/2007    Billie Ruddy, PT, DPT [facility] [address] [place], [place], [address] Phone: [phone]   Fax:  [phone] 06/04/2015, 4:55 PM

## 2015-06-07 ENCOUNTER — Ambulatory Visit: Payer: Medicare Other | Admitting: Physical Therapy

## 2015-06-07 DIAGNOSIS — R278 Other lack of coordination: Secondary | ICD-10-CM

## 2015-06-07 DIAGNOSIS — R269 Unspecified abnormalities of gait and mobility: Secondary | ICD-10-CM

## 2015-06-07 DIAGNOSIS — R29898 Other symptoms and signs involving the musculoskeletal system: Secondary | ICD-10-CM

## 2015-06-07 DIAGNOSIS — M439 Deforming dorsopathy, unspecified: Secondary | ICD-10-CM

## 2015-06-08 NOTE — Therapy (Signed)
[facility] [address] [place], [place], [address] Phone: [phone]   Fax:  [phone]  Physical Therapy Treatment  Patient Details  Name: [name] [name] MRN: [id number] Date of Birth: December 11, 1940 Referring Provider:  Ria Bush, MD  Encounter Date: 06/07/2015      PT End of Session - 06/07/15 1624    Visit Number 5   Number of Visits 17   Date for PT Re-Evaluation 07/19/15   Authorization Type [employer] [facility]   Authorization Time Period 05-20-15 - 07-19-15   PT Start Time 1532   PT Stop Time 1624   PT Time Calculation (min) 52 min   Equipment Utilized During Treatment Gait belt   Activity Tolerance Patient tolerated treatment well   Behavior During Therapy [facility] for tasks assessed/performed      Past Medical History  Diagnosis Date  . GERD (gastroesophageal reflux disease)     s/p nissen  . Familial tremor     followed by Dr. Erling Cruz  . Unspecified chronic bronchitis   . Irritable bladder   . History of pneumonia   . Hypothyroidism   . Depression   . Asthma   . CAD (coronary artery disease)     myoview 5/08:  EF 76%, no scar, no ischemia, +ECG changes with exercise;  cath 04/08/07:  pLAD 30%, mLAD 60-70% - med tx.  Marland Kitchen HLD (hyperlipidemia)   . Multiple system atrophy     [name]  . VAGINITIS, ATROPHIC 11/07/2007  . Osteoporosis 2016    DEXA T-4.4 spine deteriorated since 2012    Past Surgical History  Procedure Laterality Date  . Laparoscopic nissen fundoplication    . Cystectomy    . Tumor removal    . Tubal ligation    . Colonoscopy N/A 08/29/2013    int hemm; Inda Castle, MD  . Dexa  12/2014    T -4.4 spine  . Esophagogastroduodenoscopy N/A 01/09/2015    Procedure: ESOPHAGOGASTRODUODENOSCOPY (EGD);  Surgeon: Inda Castle, MD;  Location: [facility];  Service: Endoscopy;  Laterality: N/A;  help with transfers    There were no vitals filed for this visit.  Visit Diagnosis:   Abnormality of gait  Incoordination of extremity  Postural deformity  Right leg weakness      Subjective Assessment - 06/07/15 1630    Subjective "I'm a little more fatigued than normal today; but that's just something I have to deal with sometimes. I have been doing more walking at home with my husband helping me. Seems like my feet aren't freezing like they were."   Pertinent History Multiple Systems atrophy diagnosed in May 2014 with initial appt. at [facility] clinic in Aug. 2013;Pt. had UTI on 04-11-15; went to ED - received catherization; states she has had increased weakness and some mild decline in mobility since this problem   Patient Stated Goals improve walking and mobility   Currently in Pain? No/denies                         [facility] Adult PT Treatment/Exercise - 06/08/15 0001    Bed Mobility   Bed Mobility Supine to Sit;Scooting to [facility];Sit to Supine   Supine to Sit --   Sit to Supine 2: Max assist   Scooting to [facility] 3: Mod assist   Scooting to [facility] Details (indicate cue type and reason) tactile cueing at RLE for increased WB, to maintain contact with mat table  Transfers   Transfers Sit to Stand;Stand to Sit   Sit to Stand 4: Min assist;3: Mod assist   Stand to Sit 4: Min guard;4: Min assist   Transfers Sit to Stand;Stand to Sit   Sit to Stand 4: Min assist;3: Mod assist   Sit to Stand Details (indicate cue type and reason) Initially required manual facilitatio for full anterior weight shift   Ambulation/Gait   Ambulation/Gait Yes   Ambulation/Gait Assistance 4: Min assist;3: Mod assist   Ambulation/Gait Assistance Details Pt required min cueing only initially for safe distance of rolling walker; no assist/cueing required for safe walker advancement during this gait trial   Ambulation Distance (Feet) 116 Feet   Assistive device Rolling walker   Gait Pattern Wide base of support;Decreased hip/knee flexion - right;Decreased hip/knee flexion - left;Decreased  stride length;Step-through pattern;Abducted- right;Decreased trunk rotation   Ambulation Surface Level;Indoor   Gait Comments --   Posture/Postural Control   Posture/Postural Control Postural limitations   Postural Limitations Increased thoracic kyphosis;Forward head;Rounded Shoulders;Flexed trunk   Posture Comments --   Neuro Re-ed    Neuro Re-ed Details  Pt performed seated rock-reach PWR! exercise x10 reps to promote full anterior weight shift with sit > stand transfer; tactile cueing at R knee for increased WB. Instructed pt in the following supine Axial Mobility Program exercises x10 reps per direction for increased thoracic spine mobility, trunk elongation, and for increased coordination with functional movmement patterns: lower trunk rotation, single hip ABD, B shoulder IR/ER, neck rotation, sidelying thoracic rotation, and "scapula on thorax" exercise.   shoulder IR/ER not added to HEP due to pt difficulty   Knee/Hip Exercises: Stretches   Hip Flexor Stretch 60 seconds;Right;Left;Other (comment)  x3 per side   Knee/Hip Exercises: Aerobic   Stationary Bike --                PT Education - 06/07/15 1633    Education provided Yes   Education Details HEP - added the following supine Axial Mobility Program exercises: lower trunk rotation, single hip ABD, neck rotation, sidelying thoracic rotation, and "scapula on thorax" exercise.    Person(s) Educated Patient   Methods Explanation;Demonstration;Tactile cues;Verbal cues;Handout   Comprehension Verbalized understanding;Returned demonstration          PT Short Term Goals - 05/29/15 2030    PT SHORT TERM GOAL #1   Title Pt. will increase standing tolerance so she is able to report standing 5" at home with S for independence with ADL's. (06-19-15)   Time 4   Period Weeks   Status On-going   PT SHORT TERM GOAL #2   Title Increase distance in 3" walk test to >/= 100' for increased amb. speed and endurance.  (06-19-15)    Baseline 75' in 3" on 05-20-15   Period Weeks   Status On-going   PT SHORT TERM GOAL #3   Title Transfer sit to stand from wheelchair with min assist   (06-19-15)   Baseline mod assist   Time 4   Status On-going   PT SHORT TERM GOAL #4   Title Independent in updated HEP (06-19-15)   Time 4   Period Weeks   Status On-going   PT SHORT TERM GOAL #5   Title Improve TUG score to </= 50 secs with RW with CGA (06-19-15)   Baseline 56.22   Time 4   Status On-going           PT Long Term Goals - 05/29/15 2031  PT LONG TERM GOAL #1   Title Pt. will ambulate 240' nonstop with RW to demo incr. endurance with CGA  (07-19-15)   Period Weeks   Status On-going   PT LONG TERM GOAL #2   Title Report ability to stand at least 10" at home for improved standing tolerance   Period Weeks   Status On-going   PT LONG TERM GOAL #3   Title Pt. will transfer sit to to stand 5 times with UE support prn from mat with CGA to demo improved initial standing balance (07-19-15)   Time 8   Period Weeks   Status On-going   PT LONG TERM GOAL #4   Title Perform at least 15" on Nustep to demo improved activity tolerance/endurance  (07-19-15)   Time 8   Period Weeks   Status On-going   PT LONG TERM GOAL #5   Title Pt. will demo technique to assist with step initiation after freezing episode  (07-19-15)   Time 8   Period Weeks   Status On-going   PT LONG TERM GOAL #6   Title Improve TUG score to </=48 secs. with RW with CGA.   Baseline 56.22 secs wtih RW   Time 8   Status On-going               Plan - 06/08/15 1149    Clinical Impression Statement Initiated Axial Mobility Program for increased thoracic spine mobility, to maintain muscle length/extensibility, and for increased coordination with functional movmement patterns. Noted less episodes of freezing with gait during this session as compared with previous sessions. Continue per POC.   Pt will benefit from skilled therapeutic intervention in order  to improve on the following deficits Abnormal gait;Decreased coordination;Impaired flexibility;Difficulty walking;Decreased balance;Increased muscle spasms;Impaired tone;Decreased mobility;Decreased strength;Decreased activity tolerance;Decreased range of motion   Rehab Potential Fair   PT Frequency 2x / week   PT Duration 8 weeks   PT Treatment/Interventions ADLs/Self Care Home Management;Therapeutic activities;Patient/family education;Therapeutic exercise;Gait training;Balance training;Stair training;Neuromuscular re-education;Functional mobility training   PT Next Visit Plan Balance and gait training. Address standing tolerance. Check to see how pt is doing with Axial Mobility Program exercises at home.   Consulted and Agree with Plan of Care Patient        Problem List Patient Active Problem List   Diagnosis Date Noted  . Acute recurrent cystitis 04/18/2015  . Pedal edema 04/01/2015  . Dyspepsia 01/09/2015  . Constipation 12/14/2014  . Medicare annual wellness visit, subsequent 11/27/2014  . Advanced care planning/counseling discussion 11/27/2014  . Osteoporosis   . B-complex deficiency 06/07/2014  . Recurrent falls 02/06/2014  . Multiple system atrophy   . Dysphagia, unspecified(787.20) 07/28/2013  . Lower extremity edema 09/26/2012  . Shoulder pain, left 07/20/2012  . 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 Routine health maintenance 08/26/2011  . Murmur 05/27/2011  . HYPERCHOLESTEROLEMIA  IIA 05/14/2010  . VENOUS INSUFFICIENCY, LEGS 06/21/2009  . CORONARY HEART DISEASE 06/09/2008  . Hypothyroidism 05/15/2008  . ASTHMA 05/15/2008  . Depression 11/07/2007  . 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 GERD 11/07/2007  . VAGINITIS, ATROPHIC 11/07/2007   Billie Ruddy, PT, DPT [facility] [address] [place], [place], [address] Phone: [phone]   Fax:  [phone] 06/08/2015, 12:00 PM

## 2015-06-10 ENCOUNTER — Encounter: Payer: Self-pay | Admitting: Family Medicine

## 2015-06-10 ENCOUNTER — Ambulatory Visit (INDEPENDENT_AMBULATORY_CARE_PROVIDER_SITE_OTHER): Payer: Medicare Other | Admitting: Family Medicine

## 2015-06-10 VITALS — BP 130/70 | HR 82 | Temp 98.3°F

## 2015-06-10 DIAGNOSIS — R319 Hematuria, unspecified: Secondary | ICD-10-CM | POA: Diagnosis not present

## 2015-06-10 DIAGNOSIS — G239 Degenerative disease of basal ganglia, unspecified: Secondary | ICD-10-CM | POA: Diagnosis not present

## 2015-06-10 DIAGNOSIS — N3000 Acute cystitis without hematuria: Secondary | ICD-10-CM | POA: Diagnosis not present

## 2015-06-10 DIAGNOSIS — G903 Multi-system degeneration of the autonomic nervous system: Secondary | ICD-10-CM

## 2015-06-10 LAB — POCT URINALYSIS DIPSTICK
Bilirubin, UA: NEGATIVE
Glucose, UA: NEGATIVE
Ketones, UA: NEGATIVE
Nitrite, UA: NEGATIVE
PH UA: 7.5
SPEC GRAV UA: 1.01
Urobilinogen, UA: 0.2

## 2015-06-10 MED ORDER — CIPROFLOXACIN HCL 250 MG PO TABS: 250.0000 mg | ORAL_TABLET | Freq: Two times a day (BID) | ORAL | Status: DC

## 2015-06-10 MED ORDER — PHENAZOPYRIDINE HCL 100 MG PO TABS: 100.0000 mg | ORAL_TABLET | Freq: Two times a day (BID) | ORAL | Status: DC | PRN

## 2015-06-10 NOTE — Progress Notes (Signed)
Pre visit review using our clinic review tool, if applicable. No additional management support is needed unless otherwise documented below in the visit note. 

## 2015-06-10 NOTE — Progress Notes (Addendum)
BP 130/70 mmHg  Pulse 82  Temp(Src) 98.3 F (36.8 C) (Oral)  Wt    CC: 6 mo f/u visit  Subjective:    Patient ID: [name] [name], female    DOB: [birth year], 74 y.o.   MRN: [id number]  HPI: [name] [name] is a 74 y.o. female presenting on 06/10/2015 for Follow-up and Hematuria   Several medical visits including ER 5-04/2015 for UTIs UCx >100k pansensitive Ecoli (04/11/2015) at ER, treated with keflex --> diarrhea so changed to macrobid 153m BID. Completed 7d course. sxs resolved 04/18/2015.  Seen again 04/25/2015 with recurrent UTI E coli - treated with 1wk cipro 2529mbid course. sxs resolved.  Now over last 1 day noticing increased pressure, urgency and frequency, this morning noticed blood with wiping. Increased urinary incontinence as well. No fevers, back or flank pain, nausea or vomiting.   Known OAB. Fully voids.   04/11/2015 - pansensitive E coli >100k 04/25/2015 - pansensitive E coli 50k  05/03/2015 - test of cure urinalysis normal.  Lab Results  Component Value Date   CREATININE 0.76 04/11/2015     Has seen Dr TaGaynelle Arabiann the past.  Relevant past medical, surgical, family and social history reviewed and updated as indicated. Interim medical history since our last visit reviewed. Allergies and medications reviewed and updated. Current Outpatient Prescriptions on File Prior to Visit  Medication Sig  . AFLURIA PRESERVATIVE FREE injection   . albuterol (PROAIR HFA) 108 (90 BASE) MCG/ACT inhaler Inhale 2 puffs into the lungs every 6 (six) hours as needed for wheezing or shortness of breath.   . Marland Kitchenlum & mag hydroxide-simeth (MYLANTA) 20829-937-16G/5ML suspension Take 15 mLs by mouth every 6 (six) hours as needed for indigestion or heartburn.  . Marland Kitchenspirin (ASPIRIN EC) 81 MG EC tablet Take 81 mg by mouth 2 (two) times a week. Swallow whole. Take at bedtime  . beclomethasone (QVAR) 80 MCG/ACT inhaler Inhale 1 puff into the lungs daily as needed (wheezing).   . Calcium  Carbonate-Vitamin D (CALCIUM-VITAMIN D) 500-200 MG-UNIT per tablet Take 1 tablet by mouth daily.    . cholecalciferol (VITAMIN D) 1000 UNITS tablet Take 1,000 Units by mouth daily.  . clonazePAM (KLONOPIN) 0.5 MG tablet TAKE 1-2 TABLETS BY MOUTH ONCE A DAY AS NEEDED  . CRESTOR 20 MG tablet TAKE ONE-HALF TABLET BY MOUTH ONCE EVERY EVENING  . cyanocobalamin (,VITAMIN B-12,) 1000 MCG/ML injection Inject 1,000 mcg into the muscle every 30 (thirty) days.    . Marland Kitchenexlansoprazole (DEXILANT) 60 MG capsule Take 1 capsule (60 mg total) by mouth daily.  . fluocinolone (VANOS) 0.01 % cream Apply 1 application topically 2 (two) times daily as needed (itching).   . furosemide (LASIX) 20 MG tablet Take 1 tablet (20 mg total) by mouth daily as needed for fluid or edema.  . hydrOXYzine (ATARAX/VISTARIL) 10 MG tablet Take 10 mg by mouth 3 (three) times daily as needed for itching.  . hyoscyamine (LEVSIN SL) 0.125 MG SL tablet PLACE 2 TABLETS UNDER THE TONGUE EVERY 4 (FOUR) HOURS AS NEEDED.  . Marland Kitchenbandronate (BONIVA) 3 MG/3ML SOLN injection Inject 3 mg into the vein once. Infuse every 3 months  . isosorbide mononitrate (IMDUR) 30 MG 24 hr tablet TAKE 1/2 TABLET BY MOUTH DAILY  . levothyroxine (SYNTHROID, LEVOTHROID) 112 MCG tablet Take 112 mcg by mouth daily before breakfast.  . loratadine (CLARITIN) 10 MG tablet Take 10 mg by mouth 2 (two) times daily as needed.   . mometasone (NASONEX) 50 MCG/ACT  nasal spray Place 2 sprays into the nose daily as needed (allergies).   . nitroGLYCERIN (NITROSTAT) 0.4 MG SL tablet Place 0.4 mg under the tongue every 5 (five) minutes as needed.    . propranolol (INDERAL) 10 MG tablet TAKE 1 TABLET (10 MG TOTAL) BY MOUTH 3 (THREE) TIMES DAILY AS NEEDED (TREMORS).  Marland Kitchen sertraline (ZOLOFT) 25 MG tablet TAKE 1 TABLET (25 MG TOTAL) BY MOUTH EVERY MORNING.  Marland Kitchen polyethylene glycol powder (GLYCOLAX/MIRALAX) powder Take 17 g by mouth daily as needed for moderate constipation. (Patient not taking:  Reported on 06/10/2015)   No current facility-administered medications on file prior to visit.    Review of Systems Per HPI unless specifically indicated above     Objective:    BP 130/70 mmHg  Pulse 82  Temp(Src) 98.3 F (36.8 C) (Oral)  Wt   Wt Readings from Last 3 Encounters:  04/03/15 145 lb (65.772 kg)  01/11/15 150 lb (68.04 kg)  01/09/15 151 lb (68.493 kg)    Physical Exam  Constitutional: She appears well-developed and well-nourished. No distress.  HENT:  Mouth/Throat: Oropharynx is clear and moist. No oropharyngeal exudate.  Cardiovascular: Normal rate, regular rhythm, normal heart sounds and intact distal pulses.   No murmur heard. Pulmonary/Chest: Effort normal and breath sounds normal. No respiratory distress. She has no wheezes. She has no rales.  Abdominal: Soft. Bowel sounds are normal. She exhibits no distension and no mass. There is no hepatosplenomegaly. There is no tenderness. There is no rebound, no guarding and no CVA tenderness.  Musculoskeletal: She exhibits no edema.  Nursing note and vitals reviewed.  Results for orders placed or performed in visit on 06/10/15  Urinalysis Dipstick  Result Value Ref Range   Color, UA yellow    Clarity, UA cloudy    Glucose, UA negative    Bilirubin, UA negative    Ketones, UA negative    Spec Grav, UA 1.010    Blood, UA 2+    pH, UA 7.5    Protein, UA +    Urobilinogen, UA 0.2    Nitrite, UA negative    Leukocytes, UA large (3+) (A) Negative      Assessment & Plan:   Problem List Items Addressed This Visit    Acute recurrent cystitis - Primary    Recurrent hemorrhagic cystitis. Last was early 04/2015. Treat with another 1 wk course of cipro 273m bid. UCx sent. Micro not done. Low dose pyridium prn for bladder spasm. 3rd UTI in last 3 months. Will ask her to call Dr TGaynelle Arabianfor return appt. Pt agrees with plan.      Relevant Orders   Urine culture   Multiple system atrophy    This along with  parkinsonism complicates clinical picture.       Other Visit Diagnoses    Blood in urine        Relevant Orders    Urinalysis Dipstick (Completed)        Follow up plan: Return if symptoms worsen or fail to improve.

## 2015-06-10 NOTE — Addendum Note (Signed)
Addended by: Ria Bush on: 06/10/2015 02:37 PM   Modules accepted: Miquel Dunn

## 2015-06-10 NOTE — Assessment & Plan Note (Signed)
This along with parkinsonism complicates clinical picture.

## 2015-06-10 NOTE — Assessment & Plan Note (Addendum)
Recurrent hemorrhagic cystitis. Last was early 04/2015. Treat with another 1 wk course of cipro 265m bid. UCx sent. Micro not done. Low dose pyridium prn for bladder spasm. 3rd UTI in last 3 months. Will ask her to call Dr TGaynelle Arabianfor return appt. Pt agrees with plan.

## 2015-06-10 NOTE — Patient Instructions (Signed)
For recurrent infection - treat with cipro 281m twice daily for 1 week. If another infection occurs let uKoreaknow for referral to Dr TOrma RenderMay use pyridium 1021mas needed for bladder spasm. Let usKoreanow if not improving as expected or any worsening.

## 2015-06-11 ENCOUNTER — Ambulatory Visit: Payer: Medicare Other | Attending: Neurology | Admitting: Physical Therapy

## 2015-06-11 DIAGNOSIS — R278 Other lack of coordination: Secondary | ICD-10-CM | POA: Insufficient documentation

## 2015-06-11 DIAGNOSIS — R279 Unspecified lack of coordination: Secondary | ICD-10-CM | POA: Diagnosis present

## 2015-06-11 DIAGNOSIS — M439 Deforming dorsopathy, unspecified: Secondary | ICD-10-CM | POA: Insufficient documentation

## 2015-06-11 DIAGNOSIS — M6289 Other specified disorders of muscle: Secondary | ICD-10-CM | POA: Diagnosis present

## 2015-06-11 DIAGNOSIS — R269 Unspecified abnormalities of gait and mobility: Secondary | ICD-10-CM | POA: Insufficient documentation

## 2015-06-11 DIAGNOSIS — R29898 Other symptoms and signs involving the musculoskeletal system: Secondary | ICD-10-CM | POA: Insufficient documentation

## 2015-06-12 ENCOUNTER — Encounter: Payer: Self-pay | Admitting: Physical Therapy

## 2015-06-12 LAB — URINE CULTURE: Colony Count: >=100000

## 2015-06-12 NOTE — Therapy (Signed)
[facility] [address] [place], [place], [address] Phone: [phone]   Fax:  [phone]  Physical Therapy Treatment  Patient Details  Name: [name] [name] MRN: [id number] Date of Birth: Nov 21, 1940 Referring Provider:  Ria Bush, MD  Encounter Date: 06/11/2015      PT End of Session - 06/12/15 1351    Visit Number 6  G6   Number of Visits 17   Date for PT Re-Evaluation 07/19/15   Authorization Type UHC [facility]   Authorization Time Period 05-20-15 - 07-19-15   PT Start Time 1450   PT Stop Time 1532   PT Time Calculation (min) 42 min   Equipment Utilized During Treatment Gait belt      Past Medical History  Diagnosis Date  . GERD (gastroesophageal reflux disease)     s/p nissen  . Familial tremor     followed by Dr. Erling Cruz  . Unspecified chronic bronchitis   . Irritable bladder   . History of pneumonia   . Hypothyroidism   . Depression   . Asthma   . CAD (coronary artery disease)     myoview 5/08:  EF 76%, no scar, no ischemia, +ECG changes with exercise;  cath 04/08/07:  pLAD 30%, mLAD 60-70% - med tx.  Marland Kitchen HLD (hyperlipidemia)   . Multiple system atrophy     [name]  . VAGINITIS, ATROPHIC 11/07/2007  . Osteoporosis 2016    DEXA T-4.4 spine deteriorated since 2012    Past Surgical History  Procedure Laterality Date  . Laparoscopic nissen fundoplication    . Cystectomy    . Tumor removal    . Tubal ligation    . Colonoscopy N/A 08/29/2013    int hemm; Inda Castle, MD  . Dexa  12/2014    T -4.4 spine  . Esophagogastroduodenoscopy N/A 01/09/2015    Procedure: ESOPHAGOGASTRODUODENOSCOPY (EGD);  Surgeon: Inda Castle, MD;  Location: [facility];  Service: Endoscopy;  Laterality: N/A;  help with transfers    There were no vitals filed for this visit.  Visit Diagnosis:  Abnormality of gait  Incoordination of extremity  Lack of coordination      Subjective Assessment -  06/12/15 1338    Subjective Pt. reports she is not feeling that well today due to UTI - states she has had 3 UTI's within past 2 months so she is now having to go to urologist; is tired due to lack of sleep due to frequent urination last night    Pertinent History Multiple Systems atrophy diagnosed in May 2014 with initial appt. at [facility] clinic in Aug. 2013;Pt. had UTI on 04-11-15; went to ED - received catherization; states she has had increased weakness and some mild decline in mobility since this problem   Patient Stated Goals improve walking and mobility   Currently in Pain? No/denies                         [facility] Adult PT Treatment/Exercise - 06/12/15 0001    Transfers   Transfers Sit to Stand;Stand to Sit   Sit to Stand 3: Mod assist   Stand to Sit 3: Mod assist   Stand to Sit Details needs assistance for controlled descent due to fatigue   Ambulation/Gait   Ambulation/Gait Yes   Ambulation/Gait Assistance 4: Min assist  verbal cues needed with freezing episodes to initiate step   Ambulation/Gait Assistance Details pt has incr.  trunk flexion and needs assist. to keep RW close to her   Ambulation Distance (Feet) 120 Feet   Assistive device Rolling walker   Gait Pattern Wide base of support;Decreased hip/knee flexion - right;Decreased hip/knee flexion - left;Decreased stride length;Step-through pattern;Abducted- right;Decreased trunk rotation   Ambulation Surface Level;Indoor   Posture/Postural Control   Posture/Postural Control Postural limitations   Postural Limitations Increased thoracic kyphosis;Forward head;Rounded Shoulders;Flexed trunk   High Level Balance   High Level Balance Activities Side stepping   Neuro Re-ed    Neuro Re-ed Details  Pt performed seated PWR! reach exercise to floor and up overhead to fasciltate anterior weight shift and trunk extension x 10 reps; also performed seated lateral trun rotation with hands meeting together on either side 10 reps to  R and L as able     Sidestepping performed inside bars 10'x 1 rep only due to fatigue             PT Short Term Goals - 05/29/15 2030    PT SHORT TERM GOAL #1   Title Pt. will increase standing tolerance so she is able to report standing 5" at home with S for independence with ADL's. (06-19-15)   Time 4   Period Weeks   Status On-going   PT SHORT TERM GOAL #2   Title Increase distance in 3" walk test to >/= 100' for increased amb. speed and endurance.  (06-19-15)   Baseline 75' in 3" on 05-20-15   Period Weeks   Status On-going   PT SHORT TERM GOAL #3   Title Transfer sit to stand from wheelchair with min assist   (06-19-15)   Baseline mod assist   Time 4   Status On-going   PT SHORT TERM GOAL #4   Title Independent in updated HEP (06-19-15)   Time 4   Period Weeks   Status On-going   PT SHORT TERM GOAL #5   Title Improve TUG score to </= 50 secs with RW with CGA (06-19-15)   Baseline 56.22   Time 4   Status On-going           PT Long Term Goals - 06/12/15 1355    PT LONG TERM GOAL #1   Title Pt. will ambulate 240' nonstop with RW to demo incr. endurance with CGA  (07-19-15)   PT LONG TERM GOAL #2   Title Report ability to stand at least 10" at home for improved standing tolerance  (07-19-15)   PT LONG TERM GOAL #3   Title Pt. will transfer sit to to stand 5 times with UE support prn from mat with CGA to demo improved initial standing balance (07-19-15)   PT LONG TERM GOAL #4   Title Perform at least 15" on Nustep to demo improved activity tolerance/endurance  (07-19-15)   PT LONG TERM GOAL #5   Title Pt. will demo technique to assist with step initiation after freezing episode  (07-19-15)   PT LONG TERM GOAL #6   Title Improve TUG score to </=48 secs. with RW with CGA.  (07-19-15)               Plan - 06/12/15 1352    Clinical Impression Statement Pt's performance not as good today with more LOB noted with tranfers and approx. 4 freezing episodes occurring during  120' total amb. distance; pt. attributes poor performance today to UTI and some lack of sleep   Pt will benefit from skilled therapeutic intervention in order to  improve on the following deficits Abnormal gait;Decreased coordination;Impaired flexibility;Difficulty walking;Decreased balance;Increased muscle spasms;Impaired tone;Decreased mobility;Decreased strength;Decreased activity tolerance;Decreased range of motion   Rehab Potential Fair   PT Frequency 2x / week   PT Duration 8 weeks   PT Treatment/Interventions ADLs/Self Care Home Management;Therapeutic activities;Patient/family education;Therapeutic exercise;Gait training;Balance training;Stair training;Neuromuscular re-education;Functional mobility training   PT Next Visit Plan balance and gait training   Consulted and Agree with Plan of Care Patient        Problem List Patient Active Problem List   Diagnosis Date Noted  . Acute recurrent cystitis 04/18/2015  . Pedal edema 04/01/2015  . Dyspepsia 01/09/2015  . Constipation 12/14/2014  . Medicare annual wellness visit, subsequent 11/27/2014  . Advanced care planning/counseling discussion 11/27/2014  . Osteoporosis   . B-complex deficiency 06/07/2014  . Recurrent falls 02/06/2014  . Multiple system atrophy   . Dysphagia, unspecified(787.20) 07/28/2013  . Lower extremity edema 09/26/2012  . Shoulder pain, left 07/20/2012  . 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 Routine health maintenance 08/26/2011  . Murmur 05/27/2011  . HYPERCHOLESTEROLEMIA  IIA 05/14/2010  . VENOUS INSUFFICIENCY, LEGS 06/21/2009  . CORONARY HEART DISEASE 06/09/2008  . Hypothyroidism 05/15/2008  . ASTHMA 05/15/2008  . Depression 11/07/2007  . 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 GERD 11/07/2007  . VAGINITIS, ATROPHIC 11/07/2007    Alda Lea, PT 06/12/2015, 2:01 PM  [facility] [address] [place], [place], [address] Phone: [phone]    Fax:  [phone]

## 2015-06-14 ENCOUNTER — Telehealth: Payer: Self-pay | Admitting: Family Medicine

## 2015-06-14 ENCOUNTER — Ambulatory Visit: Payer: Medicare Other | Admitting: Physical Therapy

## 2015-06-14 NOTE — Telephone Encounter (Signed)
Pt called [facility] and can not get into until the end of septmeber. Pt is requesting that you call to see if you can explain her symptoms and conditions and see if she can get worked into a sooner appt. Her dr there is Dr Gaynelle Arabian.

## 2015-06-14 NOTE — Telephone Encounter (Signed)
Spoke with patient. She said they told her that they they could get her in with a NP much sooner but that she wanted to wait for Dr. Gaynelle Arabian. I advised that she should go ahead and see the NP and if it was anything urgent, they would consult Dr. Gaynelle Arabian. She said she hadn't thought of that and would call them back.

## 2015-06-18 ENCOUNTER — Ambulatory Visit: Payer: Medicare Other | Admitting: Physical Therapy

## 2015-06-18 ENCOUNTER — Encounter: Payer: Self-pay | Admitting: Physical Therapy

## 2015-06-18 DIAGNOSIS — R269 Unspecified abnormalities of gait and mobility: Secondary | ICD-10-CM | POA: Diagnosis not present

## 2015-06-18 DIAGNOSIS — R278 Other lack of coordination: Secondary | ICD-10-CM

## 2015-06-18 DIAGNOSIS — R279 Unspecified lack of coordination: Secondary | ICD-10-CM

## 2015-06-18 DIAGNOSIS — M439 Deforming dorsopathy, unspecified: Secondary | ICD-10-CM

## 2015-06-18 NOTE — Therapy (Signed)
[facility] [address] [place], [place], [address] Phone: [phone]   Fax:  [phone]  Physical Therapy Treatment  Patient Details  Name: [name] [name] MRN: [id number] Date of Birth: 06/01/1941 Referring Provider:  Ria Bush, MD  Encounter Date: 06/18/2015      PT End of Session - 06/18/15 1330    Visit Number 7  G7   Number of Visits 17   Date for PT Re-Evaluation 07/19/15   Authorization Type UHC [facility]   Authorization Time Period 05-20-15 - 07-19-15   PT Start Time 1327  pt late for apt today   PT Stop Time 1415  last 10 minutes of session not billed   PT Time Calculation (min) 48 min   Equipment Utilized During Treatment Gait belt      Past Medical History  Diagnosis Date  . GERD (gastroesophageal reflux disease)     s/p nissen  . Familial tremor     followed by Dr. Erling Cruz  . Unspecified chronic bronchitis   . Irritable bladder   . History of pneumonia   . Hypothyroidism   . Depression   . Asthma   . CAD (coronary artery disease)     myoview 5/08:  EF 76%, no scar, no ischemia, +ECG changes with exercise;  cath 04/08/07:  pLAD 30%, mLAD 60-70% - med tx.  Marland Kitchen HLD (hyperlipidemia)   . Multiple system atrophy     [name]  . VAGINITIS, ATROPHIC 11/07/2007  . Osteoporosis 2016    DEXA T-4.4 spine deteriorated since 2012    Past Surgical History  Procedure Laterality Date  . Laparoscopic nissen fundoplication    . Cystectomy    . Tumor removal    . Tubal ligation    . Colonoscopy N/A 08/29/2013    int hemm; Inda Castle, MD  . Dexa  12/2014    T -4.4 spine  . Esophagogastroduodenoscopy N/A 01/09/2015    Procedure: ESOPHAGOGASTRODUODENOSCOPY (EGD);  Surgeon: Inda Castle, MD;  Location: [facility];  Service: Endoscopy;  Laterality: N/A;  help with transfers    There were no vitals filed for this visit.  Visit Diagnosis:  Abnormality of gait  Incoordination of  extremity  Lack of coordination  Postural deformity      Subjective Assessment - 06/18/15 1329    Subjective Feeling better this week than last week. No falls or pain to report.    Currently in Pain? No/denies   Pain Score 0-No pain        06/18/15 0001  Transfers  Sit to Stand 3: Mod assist;4: Min assist;With upper extremity assist;From chair/3-in-1  Sit to Stand Details Verbal cues for technique;Verbal cues for sequencing  Sit to Stand Details (indicate cue type and reason) mod assist with 1st stand, progressed to min assist with next 3 times throughout session after cues.  Stand to Sit 3: Mod assist;4: Min assist;With upper extremity assist;To chair/3-in-1;Uncontrolled descent  Stand to Sit Details (indicate cue type and reason) Verbal cues for technique;Verbal cues for precautions/safety  Stand to Sit Details cues to reach back, decreased assist needed as session progressed.  Ambulation/Gait  Ambulation/Gait Yes  Ambulation/Gait Assistance 4: Min assist  Ambulation/Gait Assistance Details cues for larger steps and assist needed for walker position with gait  Ambulation Distance (Feet) 120 Feet (x1, 230 x1)  Assistive device Rolling walker  Gait Pattern Wide base of support;Decreased hip/knee flexion - right;Decreased hip/knee flexion - left;Decreased stride length;Step-through pattern;Abducted-  right;Decreased trunk rotation  Ambulation Surface Level;Indoor   Seated in her wheelchair: Seated PWR! Moves posture exercise x 15 reps Seated lateral reaching down to floor and back up, emphasis on use of trunk to return to sitting position and limited UE assist, x 10 reps toward each side.  Scifit level 1.5 x 4 extremities x 10 minutes for strengthening and activity tolerance. **No charge due to time overlapping with another pt session, pt assisted off scifit by another PT. Reported to this PT no issued with bike.          PT Short Term Goals - 06/18/15 1331    PT SHORT TERM  GOAL #1   Title Pt. will increase standing tolerance so she is able to report standing 5" at home with S for independence with ADL's. (06-19-15)   Time 4   Period Weeks   Status On-going   PT SHORT TERM GOAL #2   Title Increase distance in 3" walk test to >/= 100' for increased amb. speed and endurance.  (06-19-15)   Baseline 120 feet in 4 minutes 15 sec's   Period Weeks   Status Partially Met   PT SHORT TERM GOAL #3   Title Transfer sit to stand from wheelchair with min assist   (06-19-15)   Baseline mod assist   Time 4   Status On-going   PT SHORT TERM GOAL #4   Title Independent in updated HEP (06-19-15)   Time 4   Period Weeks   Status On-going   PT SHORT TERM GOAL #5   Title Improve TUG score to </= 50 secs with RW with CGA (06-19-15)   Baseline 56.22   Time 4   Status On-going           PT Long Term Goals - 06/12/15 1355    PT LONG TERM GOAL #1   Title Pt. will ambulate 240' nonstop with RW to demo incr. endurance with CGA  (07-19-15)   PT LONG TERM GOAL #2   Title Report ability to stand at least 10" at home for improved standing tolerance  (07-19-15)   PT LONG TERM GOAL #3   Title Pt. will transfer sit to to stand 5 times with UE support prn from mat with CGA to demo improved initial standing balance (07-19-15)   PT LONG TERM GOAL #4   Title Perform at least 15" on Nustep to demo improved activity tolerance/endurance  (07-19-15)   PT LONG TERM GOAL #5   Title Pt. will demo technique to assist with step initiation after freezing episode  (07-19-15)   PT LONG TERM GOAL #6   Title Improve TUG score to </=48 secs. with RW with CGA.  (07-19-15)           Plan - 06/18/15 1330    Clinical Impression Statement Pt with increased overall gait distance today vs last session, however did not meet gait goal for distance within 3 minutes. Will check remaining goals next session.   Pt will benefit from skilled therapeutic intervention in order to improve on the following deficits Abnormal  gait;Decreased coordination;Impaired flexibility;Difficulty walking;Decreased balance;Increased muscle spasms;Impaired tone;Decreased mobility;Decreased strength;Decreased activity tolerance;Decreased range of motion   Rehab Potential Fair   PT Frequency 2x / week   PT Duration 8 weeks   PT Treatment/Interventions ADLs/Self Care Home Management;Therapeutic activities;Patient/family education;Therapeutic exercise;Gait training;Balance training;Stair training;Neuromuscular re-education;Functional mobility training   PT Next Visit Plan assess remaining STG's;continue with balance and gait training.   Consulted and Agree  with Plan of Care Patient        Problem List Patient Active Problem List   Diagnosis Date Noted  . Acute recurrent cystitis 04/18/2015  . Pedal edema 04/01/2015  . Dyspepsia 01/09/2015  . Constipation 12/14/2014  . Medicare annual wellness visit, subsequent 11/27/2014  . Advanced care planning/counseling discussion 11/27/2014  . Osteoporosis   . B-complex deficiency 06/07/2014  . Recurrent falls 02/06/2014  . Multiple system atrophy   . Dysphagia, unspecified(787.20) 07/28/2013  . Lower extremity edema 09/26/2012  . Shoulder pain, left 07/20/2012  . 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 Routine health maintenance 08/26/2011  . Murmur 05/27/2011  . HYPERCHOLESTEROLEMIA  IIA 05/14/2010  . VENOUS INSUFFICIENCY, LEGS 06/21/2009  . CORONARY HEART DISEASE 06/09/2008  . Hypothyroidism 05/15/2008  . ASTHMA 05/15/2008  . Depression 11/07/2007  . 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 GERD 11/07/2007  . VAGINITIS, ATROPHIC 11/07/2007    Willow Ora 06/19/2015, 11:42 AM  Willow Ora, PTA, [facility] [address], [place], [place] [address] [phone] 06/19/2015, 11:42 AM

## 2015-06-20 ENCOUNTER — Ambulatory Visit: Payer: Medicare Other | Admitting: Physical Therapy

## 2015-06-20 ENCOUNTER — Encounter: Payer: Self-pay | Admitting: Physical Therapy

## 2015-06-20 DIAGNOSIS — M439 Deforming dorsopathy, unspecified: Secondary | ICD-10-CM

## 2015-06-20 DIAGNOSIS — R269 Unspecified abnormalities of gait and mobility: Secondary | ICD-10-CM | POA: Diagnosis not present

## 2015-06-20 DIAGNOSIS — R279 Unspecified lack of coordination: Secondary | ICD-10-CM

## 2015-06-20 DIAGNOSIS — R278 Other lack of coordination: Secondary | ICD-10-CM

## 2015-06-20 DIAGNOSIS — R29898 Other symptoms and signs involving the musculoskeletal system: Secondary | ICD-10-CM

## 2015-06-21 ENCOUNTER — Other Ambulatory Visit: Payer: Self-pay | Admitting: Gastroenterology

## 2015-06-21 NOTE — Therapy (Signed)
[facility] [address] [place], [place], [address] Phone: [phone]   Fax:  [phone]  Physical Therapy Treatment  Patient Details  Name: [name] [name] MRN: [id number] Date of Birth: 04-Jan-1941 Referring Provider:  Ria Bush, MD  Encounter Date: 06/20/2015      PT End of Session - 06/20/15 1451    Visit Number 8  G8   Number of Visits 17   Date for PT Re-Evaluation 07/19/15   Authorization Type [employer]HC [facility]   Authorization Time Period 05-20-15 - 07-19-15   PT Start Time 1448   PT Stop Time 1527   PT Time Calculation (min) 39 min   Equipment Utilized During Treatment Gait belt      Past Medical History  Diagnosis Date  . GERD (gastroesophageal reflux disease)     s/p nissen  . Familial tremor     followed by Dr. Erling Cruz  . Unspecified chronic bronchitis   . Irritable bladder   . History of pneumonia   . Hypothyroidism   . Depression   . Asthma   . CAD (coronary artery disease)     myoview 5/08:  EF 76%, no scar, no ischemia, +ECG changes with exercise;  cath 04/08/07:  pLAD 30%, mLAD 60-70% - med tx.  Marland Kitchen HLD (hyperlipidemia)   . Multiple system atrophy     [name]  . VAGINITIS, ATROPHIC 11/07/2007  . Osteoporosis 2016    DEXA T-4.4 spine deteriorated since 2012    Past Surgical History  Procedure Laterality Date  . Laparoscopic nissen fundoplication    . Cystectomy    . Tumor removal    . Tubal ligation    . Colonoscopy N/A 08/29/2013    int hemm; Inda Castle, MD  . Dexa  12/2014    T -4.4 spine  . Esophagogastroduodenoscopy N/A 01/09/2015    Procedure: ESOPHAGOGASTRODUODENOSCOPY (EGD);  Surgeon: Inda Castle, MD;  Location: [facility];  Service: Endoscopy;  Laterality: N/A;  help with transfers    There were no vitals filed for this visit.  Visit Diagnosis:  Abnormality of gait  Incoordination of extremity  Lack of coordination  Postural deformity  Right leg  weakness      Subjective Assessment - 06/20/15 1450    Subjective No new complaints. No pain or falls to report. UTI has cleared up, finished antibiotic last week.   Currently in Pain? No/denies   Pain Score 0-No pain          [facility] Adult PT Treatment/Exercise - 06/20/15 1510    Transfers   Sit to Stand 4: Min assist;With armrests;From chair/3-in-1;With upper extremity assist   Sit to Stand Details Verbal cues for technique;Verbal cues for sequencing   Sit to Stand Details (indicate cue type and reason) with chair supported pt able to stand with min assist, otherwise mod assist to stand and stabilize due to retropulsion with standing   Stand to Sit 4: Min assist;With upper extremity assist;To chair/3-in-1;With armrests   Stand to Sit Details (indicate cue type and reason) Verbal cues for sequencing;Verbal cues for technique   Stand to Sit Details cues to turn completely before sitting down and to reach back vs holding walker   Ambulation/Gait   Ambulation/Gait Yes   Ambulation/Gait Assistance 4: Min assist   Ambulation/Gait Assistance Details 2 freezing episodes with gait today, assist for walker position and cues for position, to increase bil step length and on posture with gait.  Ambulation Distance (Feet) 230 Feet  160 in 3 minutes   Assistive device Rolling walker   Gait Pattern Wide base of support;Decreased hip/knee flexion - right;Decreased hip/knee flexion - left;Decreased stride length;Step-through pattern;Abducted- right;Decreased trunk rotation   Ambulation Surface Level;Indoor   Timed Up and Go Test   TUG Normal TUG   Normal TUG (seconds) 46.4     standing with walker support: 4 minutes and 45 seconds with supervision, pt with posterior loss of balance due to distraction/quick head turn to great someone. Mod assist to sit safely into wheelchair behind her. On second attempt pt was able to stand for 5 minutes with supervision and distractions with no loss of  balance.  Seated in her wheelchair: Seated PWR! Moves posture exercise x 10 reps with cues on full upright posture. Seated PWR! Moves trunk rotation x 10 to each side, cues on correct form and technique. Seated lateral reaching down to floor and back up, emphasis on use of trunk to return to sitting position and limited UE assist, x 10 reps toward each side.        PT Short Term Goals - 06/20/15 1451    PT SHORT TERM GOAL #1   Title Pt. will increase standing tolerance so she is able to report standing 5" at home with S for independence with ADL's. (06-19-15)   Baseline met on 06/20/15   Period Weeks   Status Achieved   PT SHORT TERM GOAL #2   Title Increase distance in 3" walk test to >/= 100' for increased amb. speed and endurance.  (06-19-15)   Baseline 06/20/15: 160 feet  in 3 minutes with min guard assit to min assist   Period Weeks   Status Achieved   PT SHORT TERM GOAL #3   Title Transfer sit to stand from wheelchair with min assist   (06-19-15)   Baseline 06/20/15: min assist as long as chair is supported due to retropulsion with standing   Time --   Status Achieved   PT SHORT TERM GOAL #4   Title Independent in updated HEP (06-19-15)   Baseline 06/20/15: pt reports no issues with current HEP   Time --   Period --   Status Achieved   PT SHORT TERM GOAL #5   Title Improve TUG score to </= 50 secs with RW with CGA (06-19-15)   Baseline 06/20/15: 46.40 sec's with walker   Status Achieved           PT Long Term Goals - 06/12/15 1355    PT LONG TERM GOAL #1   Title Pt. will ambulate 240' nonstop with RW to demo incr. endurance with CGA  (07-19-15)   PT LONG TERM GOAL #2   Title Report ability to stand at least 10" at home for improved standing tolerance  (07-19-15)   PT LONG TERM GOAL #3   Title Pt. will transfer sit to to stand 5 times with UE support prn from mat with CGA to demo improved initial standing balance (07-19-15)   PT LONG TERM GOAL #4   Title Perform at least 15"  on Nustep to demo improved activity tolerance/endurance  (07-19-15)   PT LONG TERM GOAL #5   Title Pt. will demo technique to assist with step initiation after freezing episode  (07-19-15)   PT LONG TERM GOAL #6   Title Improve TUG score to </=48 secs. with RW with CGA.  (07-19-15)           Plan -  06/20/15 1451    Clinical Impression Statement All STG's met today and pt making steady progress toward LTG's. Pt does need supported chair with standing due to retropustion which will push chair backwards with standing.    Pt will benefit from skilled therapeutic intervention in order to improve on the following deficits Abnormal gait;Decreased coordination;Impaired flexibility;Difficulty walking;Decreased balance;Increased muscle spasms;Impaired tone;Decreased mobility;Decreased strength;Decreased activity tolerance;Decreased range of motion   Rehab Potential Fair   PT Frequency 2x / week   PT Duration 8 weeks   PT Treatment/Interventions ADLs/Self Care Home Management;Therapeutic activities;Patient/family education;Therapeutic exercise;Gait training;Balance training;Stair training;Neuromuscular re-education;Functional mobility training   PT Next Visit Plan continue with balance and gait training.   Consulted and Agree with Plan of Care Patient        Problem List Patient Active Problem List   Diagnosis Date Noted  . Acute recurrent cystitis 04/18/2015  . Pedal edema 04/01/2015  . Dyspepsia 01/09/2015  . Constipation 12/14/2014  . Medicare annual wellness visit, subsequent 11/27/2014  . Advanced care planning/counseling discussion 11/27/2014  . Osteoporosis   . B-complex deficiency 06/07/2014  . Recurrent falls 02/06/2014  . Multiple system atrophy   . Dysphagia, unspecified(787.20) 07/28/2013  . Lower extremity edema 09/26/2012  . Shoulder pain, left 07/20/2012  . 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 Routine health maintenance 08/26/2011  . Murmur 05/27/2011  . HYPERCHOLESTEROLEMIA   IIA 05/14/2010  . VENOUS INSUFFICIENCY, LEGS 06/21/2009  . CORONARY HEART DISEASE 06/09/2008  . Hypothyroidism 05/15/2008  . ASTHMA 05/15/2008  . Depression 11/07/2007  . 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 GERD 11/07/2007  . VAGINITIS, ATROPHIC 11/07/2007    Willow Ora 06/21/2015, 10:04 AM  Willow Ora, PTA, [facility] [address] [place], [place] [address] [phone] 06/21/2015, 10:04 AM

## 2015-06-25 ENCOUNTER — Ambulatory Visit: Payer: Medicare Other | Admitting: Physical Therapy

## 2015-06-25 DIAGNOSIS — R269 Unspecified abnormalities of gait and mobility: Secondary | ICD-10-CM | POA: Diagnosis not present

## 2015-06-25 DIAGNOSIS — M6289 Other specified disorders of muscle: Secondary | ICD-10-CM

## 2015-06-25 DIAGNOSIS — R278 Other lack of coordination: Secondary | ICD-10-CM

## 2015-06-26 ENCOUNTER — Encounter: Payer: Self-pay | Admitting: Physical Therapy

## 2015-06-26 NOTE — Therapy (Signed)
[facility] [address] [place], [place], [address] Phone: [phone]   Fax:  [phone]  Physical Therapy Treatment  Patient Details  Name: [name] [name] MRN: [id number] Date of Birth: 03-16-1941 Referring Provider:  Ria Bush, MD  Encounter Date: 06/25/2015      PT End of Session - 06/26/15 2014    Visit Number 9  G9   Number of Visits 17   Date for PT Re-Evaluation 07/19/15   Authorization Type UHC MCR   Authorization Time Period 05-20-15 - 07-19-15   PT Start Time 1450   PT Stop Time 1533   PT Time Calculation (min) 43 min      Past Medical History  Diagnosis Date  . GERD (gastroesophageal reflux disease)     s/p nissen  . Familial tremor     followed by Dr. Erling Cruz  . Unspecified chronic bronchitis   . Irritable bladder   . History of pneumonia   . Hypothyroidism   . Depression   . Asthma   . CAD (coronary artery disease)     myoview 5/08:  EF 76%, no scar, no ischemia, +ECG changes with exercise;  cath 04/08/07:  pLAD 30%, mLAD 60-70% - med tx.  Marland Kitchen HLD (hyperlipidemia)   . Multiple system atrophy     [name]  . VAGINITIS, ATROPHIC 11/07/2007  . Osteoporosis 2016    DEXA T-4.4 spine deteriorated since 2012    Past Surgical History  Procedure Laterality Date  . Laparoscopic nissen fundoplication    . Cystectomy    . Tumor removal    . Tubal ligation    . Colonoscopy N/A 08/29/2013    int hemm; Inda Castle, MD  . Dexa  12/2014    T -4.4 spine  . Esophagogastroduodenoscopy N/A 01/09/2015    Procedure: ESOPHAGOGASTRODUODENOSCOPY (EGD);  Surgeon: Inda Castle, MD;  Location: [facility];  Service: Endoscopy;  Laterality: N/A;  help with transfers    There were no vitals filed for this visit.  Visit Diagnosis:  Abnormality of gait  Incoordination of extremity  Abnormal increased muscle tone      Subjective Assessment - 06/26/15 2008    Subjective Pt reports  less freezing episodes noted at home while walking - within past couple of weeks - " I think it's because I'm back in therapy"   Pertinent History Multiple Systems atrophy diagnosed in May 2014 with initial appt. at [facility] clinic in Aug. 2013;Pt. had UTI on 04-11-15; went to ED - received catherization; states she has had increased weakness and some mild decline in mobility since this problem   Patient Stated Goals improve walking and mobility   Currently in Pain? No/denies                         [facility] Adult PT Treatment/Exercise - 06/26/15 0001    Transfers   Transfers Sit to Stand;Stand to Sit   Sit to Stand 4: Min assist;With armrests;From chair/3-in-1;With upper extremity assist   Sit to Stand Details Verbal cues for technique;Verbal cues for sequencing   Stand to Sit 4: Min assist;With upper extremity assist;To chair/3-in-1;With armrests   Stand to Sit Details (indicate cue type and reason) Verbal cues for sequencing;Verbal cues for technique   Stand to Sit Details cues to reach back   Transfers Sit to Stand;Stand to Sit   Stand to Sit Details nees assist to control descent and for hand  placement   Ambulation/Gait   Ambulation/Gait Yes   Ambulation/Gait Assistance 4: Min assist   Ambulation Distance (Feet) 200 Feet  seated rest period due to fatigue, then 25' to finish lap   Assistive device Rolling walker   Gait Pattern Wide base of support;Decreased hip/knee flexion - right;Decreased hip/knee flexion - left;Decreased stride length;Step-through pattern;Abducted- right;Decreased trunk rotation   Ambulation Surface Level   Knee/Hip Exercises: Aerobic   Stationary Bike Scifit level 1.5 x 15' (no charge) as unsupervised   Knee/Hip Exercises: Seated   Other Seated Knee/Hip Exercises seated LAQ alternating for incr. coordination   Other Seated Knee/Hip Exercises seated marching 10 reps each     PWR! Exercises seated - reaching down and up x 10 reps and trunk rotation  right to left with hands clapping together x 10 reps Attempted - limited by trunk tightness/ incr. tone             PT Short Term Goals - 06/20/15 1451    PT SHORT TERM GOAL #1   Title Pt. will increase standing tolerance so she is able to report standing 5" at home with S for independence with ADL's. (06-19-15)   Baseline met on 06/20/15   Period Weeks   Status Achieved   PT SHORT TERM GOAL #2   Title Increase distance in 3" walk test to >/= 100' for increased amb. speed and endurance.  (06-19-15)   Baseline 06/20/15: 160 feet  in 3 minutes with min guard assit to min assist   Period Weeks   Status Achieved   PT SHORT TERM GOAL #3   Title Transfer sit to stand from wheelchair with min assist   (06-19-15)   Baseline 06/20/15: min assist as long as chair is supported due to retropulsion with standing   Time --   Status Achieved   PT SHORT TERM GOAL #4   Title Independent in updated HEP (06-19-15)   Baseline 06/20/15: pt reports no issues with current HEP   Time --   Period --   Status Achieved   PT SHORT TERM GOAL #5   Title Improve TUG score to </= 50 secs with RW with CGA (06-19-15)   Baseline 06/20/15: 46.40 sec's with walker   Status Achieved           PT Long Term Goals - 06/12/15 1355    PT LONG TERM GOAL #1   Title Pt. will ambulate 240' nonstop with RW to demo incr. endurance with CGA  (07-19-15)   PT LONG TERM GOAL #2   Title Report ability to stand at least 10" at home for improved standing tolerance  (07-19-15)   PT LONG TERM GOAL #3   Title Pt. will transfer sit to to stand 5 times with UE support prn from mat with CGA to demo improved initial standing balance (07-19-15)   PT LONG TERM GOAL #4   Title Perform at least 15" on Nustep to demo improved activity tolerance/endurance  (07-19-15)   PT LONG TERM GOAL #5   Title Pt. will demo technique to assist with step initiation after freezing episode  (07-19-15)   PT LONG TERM GOAL #6   Title Improve TUG score to </=48 secs.  with RW with CGA.  (07-19-15)               Plan - 06/26/15 2015    Clinical Impression Statement Pt cont to fatigue with amb. - only 1 freezing episode noted today during gait; sit to  stand tranfers improved with cues for positioning prior to standing   Pt will benefit from skilled therapeutic intervention in order to improve on the following deficits Abnormal gait;Decreased coordination;Impaired flexibility;Difficulty walking;Decreased balance;Increased muscle spasms;Impaired tone;Decreased mobility;Decreased strength;Decreased activity tolerance;Decreased range of motion   Rehab Potential Fair   PT Frequency 2x / week   PT Duration 8 weeks   PT Treatment/Interventions ADLs/Self Care Home Management;Therapeutic activities;Patient/family education;Therapeutic exercise;Gait training;Balance training;Stair training;Neuromuscular re-education;Functional mobility training   PT Next Visit Plan continue with balance and gait training.   Consulted and Agree with Plan of Care Patient        Problem List Patient Active Problem List   Diagnosis Date Noted  . Acute recurrent cystitis 04/18/2015  . Pedal edema 04/01/2015  . Dyspepsia 01/09/2015  . Constipation 12/14/2014  . Medicare annual wellness visit, subsequent 11/27/2014  . Advanced care planning/counseling discussion 11/27/2014  . Osteoporosis   . B-complex deficiency 06/07/2014  . Recurrent falls 02/06/2014  . Multiple system atrophy   . Dysphagia, unspecified(787.20) 07/28/2013  . Lower extremity edema 09/26/2012  . Shoulder pain, left 07/20/2012  . 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 Routine health maintenance 08/26/2011  . Murmur 05/27/2011  . HYPERCHOLESTEROLEMIA  IIA 05/14/2010  . VENOUS INSUFFICIENCY, LEGS 06/21/2009  . CORONARY HEART DISEASE 06/09/2008  . Hypothyroidism 05/15/2008  . ASTHMA 05/15/2008  . Depression 11/07/2007  . 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 GERD 11/07/2007  . VAGINITIS, ATROPHIC 11/07/2007     Alda Lea, PT 06/26/2015, 8:18 PM  [facility] [address] [place], [place], [address] Phone: [phone]   Fax:  [phone]

## 2015-06-27 ENCOUNTER — Ambulatory Visit: Payer: Medicare Other | Admitting: Physical Therapy

## 2015-06-27 DIAGNOSIS — M6289 Other specified disorders of muscle: Secondary | ICD-10-CM

## 2015-06-27 DIAGNOSIS — R269 Unspecified abnormalities of gait and mobility: Secondary | ICD-10-CM | POA: Diagnosis not present

## 2015-06-27 DIAGNOSIS — R278 Other lack of coordination: Secondary | ICD-10-CM

## 2015-06-28 ENCOUNTER — Encounter: Payer: Self-pay | Admitting: Physical Therapy

## 2015-06-28 NOTE — Therapy (Signed)
[facility] [address] [place], [place], [address] Phone: [phone]   Fax:  [phone]  Physical Therapy Treatment  Patient Details  Name: [name] [name] MRN: [id number] Date of Birth: 1941/02/12 Referring Provider:  Ria Bush, MD  Encounter Date: 06/27/2015      PT End of Session - 06/28/15 1622    Visit Number 10   Number of Visits 17   Date for PT Re-Evaluation 07/19/15   Authorization Type UHC MCR   Authorization Time Period 05-20-15 - 07-19-15   PT Start Time 1535   PT Stop Time 1630   PT Time Calculation (min) 55 min   Equipment Utilized During Treatment Gait belt      Past Medical History  Diagnosis Date  . GERD (gastroesophageal reflux disease)     s/p nissen  . Familial tremor     followed by Dr. Erling Cruz  . Unspecified chronic bronchitis   . Irritable bladder   . History of pneumonia   . Hypothyroidism   . Depression   . Asthma   . CAD (coronary artery disease)     myoview 5/08:  EF 76%, no scar, no ischemia, +ECG changes with exercise;  cath 04/08/07:  pLAD 30%, mLAD 60-70% - med tx.  Marland Kitchen HLD (hyperlipidemia)   . Multiple system atrophy     [name]  . VAGINITIS, ATROPHIC 11/07/2007  . Osteoporosis 2016    DEXA T-4.4 spine deteriorated since 2012    Past Surgical History  Procedure Laterality Date  . Laparoscopic nissen fundoplication    . Cystectomy    . Tumor removal    . Tubal ligation    . Colonoscopy N/A 08/29/2013    int hemm; Inda Castle, MD  . Dexa  12/2014    T -4.4 spine  . Esophagogastroduodenoscopy N/A 01/09/2015    Procedure: ESOPHAGOGASTRODUODENOSCOPY (EGD);  Surgeon: Inda Castle, MD;  Location: [facility];  Service: Endoscopy;  Laterality: N/A;  help with transfers    There were no vitals filed for this visit.  Visit Diagnosis:  Abnormality of gait  Abnormal increased muscle tone  Incoordination of extremity      Subjective  Assessment - 06/28/15 1616    Subjective Pt. reports feeling a little better today; reports stiffness in legs and trunk   Pertinent History Multiple Systems atrophy diagnosed in May 2014 with initial appt. at [facility] clinic in Aug. 2013;Pt. had UTI on 04-11-15; went to ED - received catherization; states she has had increased weakness and some mild decline in mobility since this problem   Patient Stated Goals improve walking and mobility   Currently in Pain? No/denies                         [facility] Adult PT Treatment/Exercise - 06/28/15 0001    Bed Mobility   Bed Mobility Supine to Sit;Sit to Supine   Supine to Sit 3: Mod assist   Sit to Supine 4: Min assist   Transfers   Transfers Sit to Stand;Stand to Sit   Sit to Stand 4: Min assist;With armrests;From chair/3-in-1;With upper extremity assist   Sit to Stand Details Verbal cues for technique;Verbal cues for sequencing   Stand to Sit 4: Min assist;With upper extremity assist;To chair/3-in-1;With armrests   Transfers Sit to Stand;Stand to Sit   Sit to Stand 4: Min assist;3: Mod assist   Ambulation/Gait   Ambulation/Gait Yes   Ambulation/Gait  Assistance 4: Min assist   Ambulation Distance (Feet) 240 Feet   Assistive device Rolling walker   Gait Pattern Wide base of support;Decreased hip/knee flexion - right;Decreased hip/knee flexion - left;Decreased stride length;Step-through pattern;Abducted- right;Decreased trunk rotation   Ambulation Surface Level;Indoor   High Level Balance   High Level Balance Activities Side stepping;Backward walking   High Level Balance Comments alternate tap ups to 4" step ; 3 taps each foot to 4" step with UE support    TherEx; Bridging x 10 reps; knee to chest with stretch x 5 reps ; trunk rotation stretch 30 secs R and L sides; hip  Flexion/extension in hooklying x 10 reps; hip abduction/ adduction x 10 reps with assist. For adduction sit to 1/2 side sitting for stretching x 30 sec hold R and L  sides             PT Short Term Goals - 06/28/15 1622    PT SHORT TERM GOAL #1   Title Pt. will increase standing tolerance so she is able to report standing 5" at home with S for independence with ADL's. (06-19-15)   Status Achieved   PT SHORT TERM GOAL #2   Title Increase distance in 3" walk test to >/= 100' for increased amb. speed and endurance.  (06-19-15)   Baseline 06/20/15: 160 feet  in 3 minutes with min guard assit to min assist   Status Achieved   PT SHORT TERM GOAL #3   Title Transfer sit to stand from wheelchair with min assist   (06-19-15)   Status Achieved   PT SHORT TERM GOAL #4   Title Independent in updated HEP (06-19-15)   PT SHORT TERM GOAL #5   Title Improve TUG score to </= 50 secs with RW with CGA (06-19-15)   Baseline 06/20/15: 46.40 sec's with walker   Status Achieved           PT Long Term Goals - 06/12/15 1355    PT LONG TERM GOAL #1   Title Pt. will ambulate 240' nonstop with RW to demo incr. endurance with CGA  (07-19-15)   PT LONG TERM GOAL #2   Title Report ability to stand at least 10" at home for improved standing tolerance  (07-19-15)   PT LONG TERM GOAL #3   Title Pt. will transfer sit to to stand 5 times with UE support prn from mat with CGA to demo improved initial standing balance (07-19-15)   PT LONG TERM GOAL #4   Title Perform at least 15" on Nustep to demo improved activity tolerance/endurance  (07-19-15)   PT LONG TERM GOAL #5   Title Pt. will demo technique to assist with step initiation after freezing episode  (07-19-15)   PT LONG TERM GOAL #6   Title Improve TUG score to </=48 secs. with RW with CGA.  (07-19-15)               Plan - 06/28/15 1624    Clinical Impression Statement Pt.'s gait improved today compared to performance on 06-24-17 with incr. speed noted and less freezing occurrences noted; mobility imrpoved after stretching today   Pt will benefit from skilled therapeutic intervention in order to improve on the following  deficits Abnormal gait;Decreased coordination;Impaired flexibility;Difficulty walking;Decreased balance;Increased muscle spasms;Impaired tone;Decreased mobility;Decreased strength;Decreased activity tolerance;Decreased range of motion   Rehab Potential Fair   PT Frequency 2x / week   PT Duration 8 weeks   PT Treatment/Interventions ADLs/Self Care Home Management;Therapeutic activities;Patient/family education;Therapeutic  exercise;Gait training;Balance training;Stair training;Neuromuscular re-education;Functional mobility training   PT Next Visit Plan continue with balance and gait training.   Consulted and Agree with Plan of Care Patient          G-Codes - 2015/07/15 1627    Functional Assessment Tool Used 3" walk test 160' with RW:  TUG 46 secs   Functional Limitation Mobility: Walking and moving around   Mobility: Walking and Moving Around Current Status [phone]) At least 80 percent but less than 100 percent impaired, limited or restricted   Mobility: Walking and Moving Around Goal Status [id number]) At least 60 percent but less than 80 percent impaired, limited or restricted      Problem List Patient Active Problem List   Diagnosis Date Noted  . Acute recurrent cystitis 04/18/2015  . Pedal edema 04/01/2015  . Dyspepsia 01/09/2015  . Constipation 12/14/2014  . Medicare annual wellness visit, subsequent 11/27/2014  . Advanced care planning/counseling discussion 11/27/2014  . Osteoporosis   . B-complex deficiency 06/07/2014  . Recurrent falls 02/06/2014  . Multiple system atrophy   . Dysphagia, unspecified(787.20) 07/28/2013  . Lower extremity edema 09/26/2012  . Shoulder pain, left 07/20/2012  . 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 Routine health maintenance 08/26/2011  . Murmur 05/27/2011  . HYPERCHOLESTEROLEMIA  IIA 05/14/2010  . VENOUS INSUFFICIENCY, LEGS 06/21/2009  . CORONARY HEART DISEASE 06/09/2008  . Hypothyroidism 05/15/2008  . ASTHMA 05/15/2008  . Depression 11/07/2007   . 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 GERD 11/07/2007  . VAGINITIS, ATROPHIC 11/07/2007  Physical Therapy Progress Note  Dates of Reporting Period: 05-20-15 to 2015-07-15  Objective Reports of Subjective Statement: 3" walk test 160' with RW;  TUG score 46 secs with RW; pt able to amb. With RW 240' max distance due to fatigue with this  Distance and not always able to amb. This  Distance - cont to have some freezing episodes   Objective Measurements: 3" walk test 160' with RW; TUG 46 secs with RW  Goal Update: Pt. Has met 5/5 STG's; cont to have difficulty with sit to stand transfers due to leaning posteriorly and brakes on wheelchair are loose  Plan: Cont gait and balance training and there ex for stretching and strengthening  Reason Skilled Services are Required: Pt has significant tone in trunk and LE's with RLE tighter than LLE; significant standing balance deficits and pt is unable to amb. Without assistance due to high fall risk - pt unable to stand unsupported and requires use of RW for asst. With ambulation   Arlina Sabina, [place], PT 06/28/2015, 4:30 PM  [facility] [address] [place], [place], [address] Phone: [phone]   Fax:  [phone]

## 2015-07-02 ENCOUNTER — Ambulatory Visit: Payer: Medicare Other | Admitting: Physical Therapy

## 2015-07-02 DIAGNOSIS — R269 Unspecified abnormalities of gait and mobility: Secondary | ICD-10-CM

## 2015-07-02 DIAGNOSIS — M6289 Other specified disorders of muscle: Secondary | ICD-10-CM

## 2015-07-02 DIAGNOSIS — R279 Unspecified lack of coordination: Secondary | ICD-10-CM

## 2015-07-03 ENCOUNTER — Encounter: Payer: Self-pay | Admitting: Physical Therapy

## 2015-07-03 NOTE — Therapy (Signed)
[facility] [address] [place], [place], [address] Phone: [phone]   Fax:  [phone]  Physical Therapy Treatment  Patient Details  Name: [name] [name] MRN: [id number] Date of Birth: [birth year] Referring Provider:  Ria Bush, MD  Encounter Date: 07/02/2015      PT End of Session - 07/03/15 1843    Visit Number 11   Number of Visits 17   Date for PT Re-Evaluation 07/19/15   Authorization Type UHC MCR   Authorization Time Period 05-20-15 - 07-19-15   PT Start Time 1450   PT Stop Time 1535   PT Time Calculation (min) 45 min   Equipment Utilized During Treatment Gait belt      Past Medical History  Diagnosis Date  . GERD (gastroesophageal reflux disease)     s/p nissen  . Familial tremor     followed by Dr. Erling Cruz  . Unspecified chronic bronchitis   . Irritable bladder   . History of pneumonia   . Hypothyroidism   . Depression   . Asthma   . CAD (coronary artery disease)     myoview 5/08:  EF 76%, no scar, no ischemia, +ECG changes with exercise;  cath 04/08/07:  pLAD 30%, mLAD 60-70% - med tx.  Marland Kitchen HLD (hyperlipidemia)   . Multiple system atrophy     [name]  . VAGINITIS, ATROPHIC 11/07/2007  . Osteoporosis 2016    DEXA T-4.4 spine deteriorated since 2012    Past Surgical History  Procedure Laterality Date  . Laparoscopic nissen fundoplication    . Cystectomy    . Tumor removal    . Tubal ligation    . Colonoscopy N/A 08/29/2013    int hemm; Inda Castle, MD  . Dexa  12/2014    T -4.4 spine  . Esophagogastroduodenoscopy N/A 01/09/2015    Procedure: ESOPHAGOGASTRODUODENOSCOPY (EGD);  Surgeon: Inda Castle, MD;  Location: [facility];  Service: Endoscopy;  Laterality: N/A;  help with transfers    There were no vitals filed for this visit.  Visit Diagnosis:  Abnormality of gait  Abnormal increased muscle tone  Lack of coordination      Subjective Assessment -  07/03/15 1839    Subjective No new problems reported   Pertinent History Multiple Systems atrophy diagnosed in May 2014 with initial appt. at [facility] clinic in Aug. 2013;Pt. had UTI on 04-11-15; went to ED - received catherization; states she has had increased weakness and some mild decline in mobility since this problem   Patient Stated Goals improve walking and mobility   Currently in Pain? No/denies                         [facility] Adult PT Treatment/Exercise - 07/03/15 0001    Bed Mobility   Bed Mobility Supine to Sit;Sit to Supine   Supine to Sit 3: Mod assist   Sit to Supine 3: Mod assist   Transfers   Transfers Sit to Stand;Stand to Sit   Sit to Stand 3: Mod assist   Stand to Sit 4: Min assist   Ambulation/Gait   Ambulation/Gait Yes   Ambulation/Gait Assistance 4: Min assist   Ambulation/Gait Assistance Details cues for RW positioning and step length   Ambulation Distance (Feet) 240 Feet   Assistive device Rolling walker   Gait Pattern Wide base of support;Decreased hip/knee flexion - right;Decreased hip/knee flexion - left;Decreased stride length;Step-through pattern;Abducted- right;Decreased trunk  rotation   Ambulation Surface Level;Indoor   Knee/Hip Exercises: Aerobic   Stationary Bike Scifit level 1.5 x 10' (no charge) as unsupervised                  PT Short Term Goals - 06/28/15 1622    PT SHORT TERM GOAL #1   Title Pt. will increase standing tolerance so she is able to report standing 5" at home with S for independence with ADL's. (06-19-15)   Status Achieved   PT SHORT TERM GOAL #2   Title Increase distance in 3" walk test to >/= 100' for increased amb. speed and endurance.  (06-19-15)   Baseline 06/20/15: 160 feet  in 3 minutes with min guard assit to min assist   Status Achieved   PT SHORT TERM GOAL #3   Title Transfer sit to stand from wheelchair with min assist   (06-19-15)   Status Achieved   PT SHORT TERM GOAL #4   Title Independent in  updated HEP (06-19-15)   PT SHORT TERM GOAL #5   Title Improve TUG score to </= 50 secs with RW with CGA (06-19-15)   Baseline 06/20/15: 46.40 sec's with walker   Status Achieved           PT Long Term Goals - 06/12/15 1355    PT LONG TERM GOAL #1   Title Pt. will ambulate 240' nonstop with RW to demo incr. endurance with CGA  (07-19-15)   PT LONG TERM GOAL #2   Title Report ability to stand at least 10" at home for improved standing tolerance  (07-19-15)   PT LONG TERM GOAL #3   Title Pt. will transfer sit to to stand 5 times with UE support prn from mat with CGA to demo improved initial standing balance (07-19-15)   PT LONG TERM GOAL #4   Title Perform at least 15" on Nustep to demo improved activity tolerance/endurance  (07-19-15)   PT LONG TERM GOAL #5   Title Pt. will demo technique to assist with step initiation after freezing episode  (07-19-15)   PT LONG TERM GOAL #6   Title Improve TUG score to </=48 secs. with RW with CGA.  (07-19-15)               Plan - 07/03/15 1844    Clinical Impression Statement Pt. continues to have difficutly with sit to stand and needs cues for positioning and for hand placement; L side trunk appears tighter than R side   Pt will benefit from skilled therapeutic intervention in order to improve on the following deficits Abnormal gait;Decreased coordination;Impaired flexibility;Difficulty walking;Decreased balance;Increased muscle spasms;Impaired tone;Decreased mobility;Decreased strength;Decreased activity tolerance;Decreased range of motion   Rehab Potential Fair   PT Frequency 2x / week   PT Duration 8 weeks   PT Treatment/Interventions ADLs/Self Care Home Management;Therapeutic activities;Patient/family education;Therapeutic exercise;Gait training;Balance training;Stair training;Neuromuscular re-education;Functional mobility training   PT Next Visit Plan continue with balance and gait training.   Consulted and Agree with Plan of Care Patient         Problem List Patient Active Problem List   Diagnosis Date Noted  . Acute recurrent cystitis 04/18/2015  . Pedal edema 04/01/2015  . Dyspepsia 01/09/2015  . Constipation 12/14/2014  . Medicare annual wellness visit, subsequent 11/27/2014  . Advanced care planning/counseling discussion 11/27/2014  . Osteoporosis   . B-complex deficiency 06/07/2014  . Recurrent falls 02/06/2014  . Multiple system atrophy   . Dysphagia, unspecified(787.20) 07/28/2013  .  Lower extremity edema 09/26/2012  . Shoulder pain, left 07/20/2012  . 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 Routine health maintenance 08/26/2011  . Murmur 05/27/2011  . HYPERCHOLESTEROLEMIA  IIA 05/14/2010  . VENOUS INSUFFICIENCY, LEGS 06/21/2009  . CORONARY HEART DISEASE 06/09/2008  . Hypothyroidism 05/15/2008  . ASTHMA 05/15/2008  . Depression 11/07/2007  . 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 GERD 11/07/2007  . VAGINITIS, ATROPHIC 11/07/2007    Alda Lea, PT 07/03/2015, 6:49 PM  [facility] [address] [place], [place], [address] Phone: [phone]   Fax:  [phone]

## 2015-07-04 ENCOUNTER — Ambulatory Visit: Payer: Medicare Other | Admitting: Physical Therapy

## 2015-07-04 DIAGNOSIS — R279 Unspecified lack of coordination: Secondary | ICD-10-CM

## 2015-07-04 DIAGNOSIS — M6289 Other specified disorders of muscle: Secondary | ICD-10-CM

## 2015-07-04 DIAGNOSIS — R269 Unspecified abnormalities of gait and mobility: Secondary | ICD-10-CM | POA: Diagnosis not present

## 2015-07-06 ENCOUNTER — Encounter: Payer: Self-pay | Admitting: Physical Therapy

## 2015-07-06 NOTE — Therapy (Signed)
[facility] [address] [place], [place], [address] Phone: [phone]   Fax:  [phone]  Physical Therapy Treatment  Patient Details  Name: [name] [name] MRN: [id number] Date of Birth: 11/28/1940 Referring Provider:  Ria Bush, MD  Encounter Date: 07/04/2015    Past Medical History  Diagnosis Date  . GERD (gastroesophageal reflux disease)     s/p nissen  . Familial tremor     followed by Dr. Erling Cruz  . Unspecified chronic bronchitis   . Irritable bladder   . History of pneumonia   . Hypothyroidism   . Depression   . Asthma   . CAD (coronary artery disease)     myoview 5/08:  EF 76%, no scar, no ischemia, +ECG changes with exercise;  cath 04/08/07:  pLAD 30%, mLAD 60-70% - med tx.  Marland Kitchen HLD (hyperlipidemia)   . Multiple system atrophy     [name]  . VAGINITIS, ATROPHIC 11/07/2007  . Osteoporosis 2016    DEXA T-4.4 spine deteriorated since 2012    Past Surgical History  Procedure Laterality Date  . Laparoscopic nissen fundoplication    . Cystectomy    . Tumor removal    . Tubal ligation    . Colonoscopy N/A 08/29/2013    int hemm; Inda Castle, MD  . Dexa  12/2014    T -4.4 spine  . Esophagogastroduodenoscopy N/A 01/09/2015    Procedure: ESOPHAGOGASTRODUODENOSCOPY (EGD);  Surgeon: Inda Castle, MD;  Location: [facility];  Service: Endoscopy;  Laterality: N/A;  help with transfers    There were no vitals filed for this visit.  Visit Diagnosis:  Abnormality of gait  Lack of coordination  Abnormal increased muscle tone      Subjective Assessment - 07/06/15 1447    Subjective Pt reports no falls - no new problems   Pertinent History Multiple Systems atrophy diagnosed in May 2014 with initial appt. at [facility] clinic in Aug. 2013;Pt. had UTI on 04-11-15; went to ED - received catherization; states she has had increased weakness and some mild decline in mobility since this problem    Patient Stated Goals improve walking and mobility   Currently in Pain? No/denies                         [facility] Adult PT Treatment/Exercise - 07/06/15 0001    Transfers   Transfers Sit to Stand;Stand to Sit   Sit to Stand 3: Mod assist   Sit to Stand Details Verbal cues for technique;Verbal cues for sequencing   Stand to Sit 3: Mod assist   Stand to Sit Details (indicate cue type and reason) Verbal cues for sequencing;Verbal cues for technique   Ambulation/Gait   Ambulation/Gait Yes   Ambulation/Gait Assistance 4: Min assist   Ambulation/Gait Assistance Details cues for weight shift with freezing episodes   Ambulation Distance (Feet) 240 Feet   Assistive device Rolling walker   Gait Pattern Wide base of support;Decreased hip/knee flexion - right;Decreased hip/knee flexion - left;Decreased stride length;Step-through pattern;Abducted- right;Decreased trunk rotation   Ambulation Surface Level;Indoor   High Level Balance   High Level Balance Activities Side stepping;Backward walking   High Level Balance Comments alternate tap ups to 4" step ; 3 taps each foot to 4" step with UE support   Knee/Hip Exercises: Aerobic   Stationary Bike Scifit level 1.5 x 10' (no charge) as unsupervised  PT Short Term Goals - 06/28/15 1622    PT SHORT TERM GOAL #1   Title Pt. will increase standing tolerance so she is able to report standing 5" at home with S for independence with ADL's. (06-19-15)   Status Achieved   PT SHORT TERM GOAL #2   Title Increase distance in 3" walk test to >/= 100' for increased amb. speed and endurance.  (06-19-15)   Baseline 06/20/15: 160 feet  in 3 minutes with min guard assit to min assist   Status Achieved   PT SHORT TERM GOAL #3   Title Transfer sit to stand from wheelchair with min assist   (06-19-15)   Status Achieved   PT SHORT TERM GOAL #4   Title Independent in updated HEP (06-19-15)   PT SHORT TERM GOAL #5   Title Improve  TUG score to </= 50 secs with RW with CGA (06-19-15)   Baseline 06/20/15: 46.40 sec's with walker   Status Achieved           PT Long Term Goals - 06/12/15 1355    PT LONG TERM GOAL #1   Title Pt. will ambulate 240' nonstop with RW to demo incr. endurance with CGA  (07-19-15)   PT LONG TERM GOAL #2   Title Report ability to stand at least 10" at home for improved standing tolerance  (07-19-15)   PT LONG TERM GOAL #3   Title Pt. will transfer sit to to stand 5 times with UE support prn from mat with CGA to demo improved initial standing balance (07-19-15)   PT LONG TERM GOAL #4   Title Perform at least 15" on Nustep to demo improved activity tolerance/endurance  (07-19-15)   PT LONG TERM GOAL #5   Title Pt. will demo technique to assist with step initiation after freezing episode  (07-19-15)   PT LONG TERM GOAL #6   Title Improve TUG score to </=48 secs. with RW with CGA.  (07-19-15)               Problem List Patient Active Problem List   Diagnosis Date Noted  . Acute recurrent cystitis 04/18/2015  . Pedal edema 04/01/2015  . Dyspepsia 01/09/2015  . Constipation 12/14/2014  . Medicare annual wellness visit, subsequent 11/27/2014  . Advanced care planning/counseling discussion 11/27/2014  . Osteoporosis   . B-complex deficiency 06/07/2014  . Recurrent falls 02/06/2014  . Multiple system atrophy   . Dysphagia, unspecified(787.20) 07/28/2013  . Lower extremity edema 09/26/2012  . Shoulder pain, left 07/20/2012  . 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 Routine health maintenance 08/26/2011  . Murmur 05/27/2011  . HYPERCHOLESTEROLEMIA  IIA 05/14/2010  . VENOUS INSUFFICIENCY, LEGS 06/21/2009  . CORONARY HEART DISEASE 06/09/2008  . Hypothyroidism 05/15/2008  . ASTHMA 05/15/2008  . Depression 11/07/2007  . 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 GERD 11/07/2007  . VAGINITIS, ATROPHIC 11/07/2007    Alda Lea, PT 07/06/2015, 2:52 PM  [facility] [address] [place], [place], [address] Phone: [phone]   Fax:  [phone]

## 2015-07-09 ENCOUNTER — Ambulatory Visit: Payer: Medicare Other | Admitting: Physical Therapy

## 2015-07-11 ENCOUNTER — Other Ambulatory Visit: Payer: Self-pay | Admitting: Gastroenterology

## 2015-07-11 ENCOUNTER — Encounter: Payer: Self-pay | Admitting: Family Medicine

## 2015-07-11 ENCOUNTER — Ambulatory Visit: Payer: Medicare Other | Admitting: Physical Therapy

## 2015-07-11 ENCOUNTER — Ambulatory Visit: Payer: Medicare Other | Attending: Neurology | Admitting: Physical Therapy

## 2015-07-11 DIAGNOSIS — R279 Unspecified lack of coordination: Secondary | ICD-10-CM | POA: Insufficient documentation

## 2015-07-11 DIAGNOSIS — R29898 Other symptoms and signs involving the musculoskeletal system: Secondary | ICD-10-CM | POA: Insufficient documentation

## 2015-07-11 DIAGNOSIS — N319 Neuromuscular dysfunction of bladder, unspecified: Secondary | ICD-10-CM | POA: Insufficient documentation

## 2015-07-11 DIAGNOSIS — R269 Unspecified abnormalities of gait and mobility: Secondary | ICD-10-CM | POA: Diagnosis present

## 2015-07-11 DIAGNOSIS — R278 Other lack of coordination: Secondary | ICD-10-CM | POA: Insufficient documentation

## 2015-07-11 DIAGNOSIS — M6289 Other specified disorders of muscle: Secondary | ICD-10-CM | POA: Diagnosis present

## 2015-07-11 NOTE — Therapy (Signed)
[facility] [address] [place], [place], [address] Phone: [phone]   Fax:  [phone]  Physical Therapy Treatment  Patient Details  Name: [name] [name] MRN: [id number] Date of Birth: 1941/06/27 Referring Provider:  Ria Bush, MD  Encounter Date: 07/11/2015      PT End of Session - 07/11/15 1458    Visit Number 12   Number of Visits 17   Date for PT Re-Evaluation 07/19/15   Authorization Type U[employer] MCR   Authorization Time Period 05-20-15 - 07-19-15   PT Start Time 1318   PT Stop Time 1400   PT Time Calculation (min) 42 min   Equipment Utilized During Treatment Gait belt   Activity Tolerance Patient tolerated treatment well   Behavior During Therapy [facility] for tasks assessed/performed      Past Medical History  Diagnosis Date  . GERD (gastroesophageal reflux disease)     s/p nissen  . Familial tremor     followed by Dr. Erling Cruz  . Unspecified chronic bronchitis   . Irritable bladder   . History of pneumonia   . Hypothyroidism   . Depression   . Asthma   . CAD (coronary artery disease)     myoview 5/08:  EF 76%, no scar, no ischemia, +ECG changes with exercise;  cath 04/08/07:  pLAD 30%, mLAD 60-70% - med tx.  Marland Kitchen HLD (hyperlipidemia)   . Multiple system atrophy     [name]  . VAGINITIS, ATROPHIC 11/07/2007  . Osteoporosis 2016    DEXA T-4.4 spine deteriorated since 2012    Past Surgical History  Procedure Laterality Date  . Laparoscopic nissen fundoplication    . Cystectomy    . Tumor removal    . Tubal ligation    . Colonoscopy N/A 08/29/2013    int hemm; Inda Castle, MD  . Dexa  12/2014    T -4.4 spine  . Esophagogastroduodenoscopy N/A 01/09/2015    Procedure: ESOPHAGOGASTRODUODENOSCOPY (EGD);  Surgeon: Inda Castle, MD;  Location: [facility];  Service: Endoscopy;  Laterality: N/A;  help with transfers    There were no vitals filed for this visit.  Visit Diagnosis:   Abnormality of gait  Abnormal increased muscle tone      Subjective Assessment - 07/11/15 1321    Subjective Pt went to urologist yesterday and wants her to have Botox of bladder but she is not going to do it.   Pertinent History Multiple Systems atrophy diagnosed in May 2014 with initial appt. at [facility] clinic in Aug. 2013;Pt. had UTI on 04-11-15; went to ED - received catherization; states she has had increased weakness and some mild decline in mobility since this problem   Patient Stated Goals improve walking and mobility   Currently in Pain? No/denies                         [facility] Adult PT Treatment/Exercise - 07/11/15 0001    Transfers   Transfers Sit to Stand;Stand to Sit   Sit to Stand 3: Mod assist   Sit to Stand Details Verbal cues for technique   Stand to Sit 3: Mod assist   Stand to Sit Details (indicate cue type and reason) Verbal cues for technique   Comments pushes posteriorly   Ambulation/Gait   Ambulation/Gait Yes   Ambulation/Gait Assistance 4: Min assist  mod assist as pt fatigued   Ambulation/Gait Assistance Details cues for weight shifting  and assist to control RW   Ambulation Distance (Feet) 350 Feet  100   Assistive device Rolling walker   Gait Pattern Wide base of support;Decreased hip/knee flexion - right;Decreased hip/knee flexion - left;Decreased stride length;Step-through pattern;Abducted- right;Decreased trunk rotation   Ambulation Surface Level;Indoor   High Level Balance   High Level Balance Activities Other (comment)  weight shifting activities at counter and reaching   Knee/Hip Exercises: Aerobic   Stationary Bike Scifit level 1.5 x 10' (no charge) as unsupervised   Knee/Hip Exercises: Standing   Knee Flexion AROM;Both;20 reps;Other (comment)  marching with counter support   Other Standing Knee Exercises forward step with weight shifting in parallel bars with UE support and staggered stance weight shifting                   PT Short Term Goals - 06/28/15 1622    PT SHORT TERM GOAL #1   Title Pt. will increase standing tolerance so she is able to report standing 5" at home with S for independence with ADL's. (06-19-15)   Status Achieved   PT SHORT TERM GOAL #2   Title Increase distance in 3" walk test to >/= 100' for increased amb. speed and endurance.  (06-19-15)   Baseline 06/20/15: 160 feet  in 3 minutes with min guard assit to min assist   Status Achieved   PT SHORT TERM GOAL #3   Title Transfer sit to stand from wheelchair with min assist   (06-19-15)   Status Achieved   PT SHORT TERM GOAL #4   Title Independent in updated HEP (06-19-15)   PT SHORT TERM GOAL #5   Title Improve TUG score to </= 50 secs with RW with CGA (06-19-15)   Baseline 06/20/15: 46.40 sec's with walker   Status Achieved           PT Long Term Goals - 06/12/15 1355    PT LONG TERM GOAL #1   Title Pt. will ambulate 240' nonstop with RW to demo incr. endurance with CGA  (07-19-15)   PT LONG TERM GOAL #2   Title Report ability to stand at least 10" at home for improved standing tolerance  (07-19-15)   PT LONG TERM GOAL #3   Title Pt. will transfer sit to to stand 5 times with UE support prn from mat with CGA to demo improved initial standing balance (07-19-15)   PT LONG TERM GOAL #4   Title Perform at least 15" on Nustep to demo improved activity tolerance/endurance  (07-19-15)   PT LONG TERM GOAL #5   Title Pt. will demo technique to assist with step initiation after freezing episode  (07-19-15)   PT LONG TERM GOAL #6   Title Improve TUG score to </=48 secs. with RW with CGA.  (07-19-15)               Plan - 07/11/15 1458    Clinical Impression Statement Continues to push posteriorly with sit to stand.  Freezing during gait with RLE and poor weight shift.  Continue PT per POC.   Pt will benefit from skilled therapeutic intervention in order to improve on the following deficits Abnormal gait;Decreased coordination;Impaired  flexibility;Difficulty walking;Decreased balance;Increased muscle spasms;Impaired tone;Decreased mobility;Decreased strength;Decreased activity tolerance;Decreased range of motion   Rehab Potential Fair   PT Frequency 2x / week   PT Duration 8 weeks   PT Treatment/Interventions ADLs/Self Care Home Management;Therapeutic activities;Patient/family education;Therapeutic exercise;Gait training;Balance training;Stair training;Neuromuscular re-education;Functional mobility training   PT  Next Visit Plan Begin checking goals and renewal by Guido Sander, PT   Consulted and Agree with Plan of Care Patient        Problem List Patient Active Problem List   Diagnosis Date Noted  . Acute recurrent cystitis 04/18/2015  . Pedal edema 04/01/2015  . Dyspepsia 01/09/2015  . Constipation 12/14/2014  . Medicare annual wellness visit, subsequent 11/27/2014  . Advanced care planning/counseling discussion 11/27/2014  . Osteoporosis   . B-complex deficiency 06/07/2014  . Recurrent falls 02/06/2014  . Multiple system atrophy   . Dysphagia, unspecified(787.20) 07/28/2013  . Lower extremity edema 09/26/2012  . Shoulder pain, left 07/20/2012  . 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 Routine health maintenance 08/26/2011  . Murmur 05/27/2011  . HYPERCHOLESTEROLEMIA  IIA 05/14/2010  . VENOUS INSUFFICIENCY, LEGS 06/21/2009  . CORONARY HEART DISEASE 06/09/2008  . Hypothyroidism 05/15/2008  . ASTHMA 05/15/2008  . Depression 11/07/2007  . 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 GERD 11/07/2007  . VAGINITIS, ATROPHIC 11/07/2007    [name] 07/11/2015, 3:00 PM  [facility] [address] [place], [place], [address] Phone: [phone]   Fax:  [place], [place] 07/11/2015 3:00 PM Phone: [phone] Fax: [phone]

## 2015-07-13 ENCOUNTER — Other Ambulatory Visit: Payer: Self-pay | Admitting: Gastroenterology

## 2015-07-16 ENCOUNTER — Ambulatory Visit: Payer: Medicare Other | Admitting: Physical Therapy

## 2015-07-16 DIAGNOSIS — R269 Unspecified abnormalities of gait and mobility: Secondary | ICD-10-CM

## 2015-07-16 DIAGNOSIS — R278 Other lack of coordination: Secondary | ICD-10-CM

## 2015-07-16 NOTE — Therapy (Signed)
[facility] [address] [place], [place], [address] Phone: [phone]   Fax:  [phone]  Physical Therapy Treatment  Patient Details  Name: [name] [name] MRN: [id number] Date of Birth: 1941/02/12 Referring Provider:  Ria Bush, MD  Encounter Date: 07/16/2015      PT End of Session - 07/16/15 1501    Visit Number 13   Number of Visits 17   Date for PT Re-Evaluation 07/19/15   Authorization Type UHC MCR   Authorization Time Period 05-20-15 - 07-19-15   PT Start Time 1409   PT Stop Time 1450   PT Time Calculation (min) 41 min   Equipment Utilized During Treatment Gait belt   Activity Tolerance Patient tolerated treatment well   Behavior During Therapy [facility] for tasks assessed/performed      Past Medical History  Diagnosis Date  . GERD (gastroesophageal reflux disease)     s/p nissen  . Familial tremor     followed by Dr. Erling Cruz  . Unspecified chronic bronchitis   . Neurogenic bladder     [facility]  . History of pneumonia   . Hypothyroidism   . Depression   . Asthma   . CAD (coronary artery disease)     myoview 5/08:  EF 76%, no scar, no ischemia, +ECG changes with exercise;  cath 04/08/07:  pLAD 30%, mLAD 60-70% - med tx.  Marland Kitchen HLD (hyperlipidemia)   . Multiple system atrophy     [name]  . VAGINITIS, ATROPHIC 11/07/2007  . Osteoporosis 2016    DEXA T-4.4 spine deteriorated since 2012    Past Surgical History  Procedure Laterality Date  . Laparoscopic nissen fundoplication    . Cystectomy    . Tumor removal    . Tubal ligation    . Colonoscopy N/A 08/29/2013    int hemm; Inda Castle, MD  . Dexa  12/2014    T -4.4 spine  . Esophagogastroduodenoscopy N/A 01/09/2015    Procedure: ESOPHAGOGASTRODUODENOSCOPY (EGD);  Surgeon: Inda Castle, MD;  Location: [facility];  Service: Endoscopy;  Laterality: N/A;  help with transfers    There were no vitals filed for this  visit.  Visit Diagnosis:  Abnormality of gait  Incoordination of extremity      Subjective Assessment - 07/16/15 1410    Subjective Denies falls or changes.   Pertinent History Multiple Systems atrophy diagnosed in May 2014 with initial appt. at [facility] in Aug. 2013;Pt. had UTI on 04-11-15; went to ED - received catherization; states she has had increased weakness and some mild decline in mobility since this problem   Patient Stated Goals improve walking and mobility   Currently in Pain? No/denies                         [facility] Adult PT Treatment/Exercise - 07/16/15 1430    Transfers   Transfers Sit to Stand;Stand to Sit   Sit to Stand 3: Mod assist   Sit to Stand Details Verbal cues for technique;Verbal cues for precautions/safety;Manual facilitation for weight shifting   Sit to Stand Details (indicate cue type and reason) repeated x 5 from mat with CGA but pushes posteriorly against mat. Repeated x 10 working on forward lean keeping arms on mat then 10 x from chair.   Stand to Sit 3: Mod assist;4: Min guard   Stand to Sit Details (indicate cue type and reason) Verbal cues for technique;Verbal  cues for precautions/safety   Stand to Sit Details cues to control descent and assist to hold chair secondary to posterior lean   Comments pushes posteriorly   Sit to Stand Details (indicate cue type and reason) RLE continues with decreased coordination with difficulty maintaining foot flat on floor during transition;difficulty scooting to edge of mat or chair to prepare for sit>stand   Ambulation/Gait   Ambulation/Gait Yes   Ambulation/Gait Assistance 4: Min assist;3: Mod assist;4: Min guard  see below   Ambulation/Gait Assistance Details initially required CGA but after approx 190', pt with fatigue, increased freezing of RLE and needed min-mod assist to maintain balance and for weight shift   Ambulation Distance (Feet) 240 Feet   Assistive device Rolling walker   Gait  Pattern Wide base of support;Decreased hip/knee flexion - right;Decreased hip/knee flexion - left;Decreased stride length;Step-through pattern;Abducted- right;Decreased trunk rotation   Ambulation Surface Level;Indoor   Timed Up and Go Test   TUG Normal TUG   Normal TUG (seconds) 52.5   Knee/Hip Exercises: Aerobic   Stationary Bike Scifit level 1.5 x 15' (no charge) as unsupervised                PT Education - 07/16/15 1459    Education provided Yes   Education Details progress toward goals   Person(s) Educated Patient   Methods Explanation   Comprehension Verbalized understanding          PT Short Term Goals - 06/28/15 1622    PT SHORT TERM GOAL #1   Title Pt. will increase standing tolerance so she is able to report standing 5" at home with S for independence with ADL's. (06-19-15)   Status Achieved   PT SHORT TERM GOAL #2   Title Increase distance in 3" walk test to >/= 100' for increased amb. speed and endurance.  (06-19-15)   Baseline 06/20/15: 160 feet  in 3 minutes with min guard assit to min assist   Status Achieved   PT SHORT TERM GOAL #3   Title Transfer sit to stand from wheelchair with min assist   (06-19-15)   Status Achieved   PT SHORT TERM GOAL #4   Title Independent in updated HEP (06-19-15)   PT SHORT TERM GOAL #5   Title Improve TUG score to </= 50 secs with RW with CGA (06-19-15)   Baseline 06/20/15: 46.40 sec's with walker   Status Achieved           PT Long Term Goals - 06/12/15 1355    PT LONG TERM GOAL #1   Title Pt. will ambulate 240' nonstop with RW to demo incr. endurance with CGA  (07-19-15)   PT LONG TERM GOAL #2   Title Report ability to stand at least 10" at home for improved standing tolerance  (07-19-15)   PT LONG TERM GOAL #3   Title Pt. will transfer sit to to stand 5 times with UE support prn from mat with CGA to demo improved initial standing balance (07-19-15)   PT LONG TERM GOAL #4   Title Perform at least 15" on Nustep to demo  improved activity tolerance/endurance  (07-19-15)   PT LONG TERM GOAL #5   Title Pt. will demo technique to assist with step initiation after freezing episode  (07-19-15)   PT LONG TERM GOAL #6   Title Improve TUG score to </=48 secs. with RW with CGA.  (07-19-15)  Plan - 07/16/15 1502    Clinical Impression Statement Pt met goals 3 & 4.  Other goals not met secondary to continues with decreased coordination of RLE requiring assist at times for balance and weight shifting.  Leans posteriorly with transfers.  Freezing continues during gait.  Renewal by Guido Sander, PT , on next visit.   Pt will benefit from skilled therapeutic intervention in order to improve on the following deficits Abnormal gait;Decreased coordination;Impaired flexibility;Difficulty walking;Decreased balance;Increased muscle spasms;Impaired tone;Decreased mobility;Decreased strength;Decreased activity tolerance;Decreased range of motion   Rehab Potential Fair   PT Frequency 2x / week   PT Duration 8 weeks   PT Treatment/Interventions ADLs/Self Care Home Management;Therapeutic activities;Patient/family education;Therapeutic exercise;Gait training;Balance training;Stair training;Neuromuscular re-education;Functional mobility training   PT Next Visit Plan Renewal per [facility], PT.   Consulted and Agree with Plan of Care Patient        Problem List Patient Active Problem List   Diagnosis Date Noted  . Neurogenic bladder   . Acute recurrent cystitis 04/18/2015  . Pedal edema 04/01/2015  . Dyspepsia 01/09/2015  . Constipation 12/14/2014  . Medicare annual wellness visit, subsequent 11/27/2014  . Advanced care planning/counseling discussion 11/27/2014  . Osteoporosis   . B-complex deficiency 06/07/2014  . Recurrent falls 02/06/2014  . Multiple system atrophy   . Dysphagia, unspecified(787.20) 07/28/2013  . Lower extremity edema 09/26/2012  . Shoulder pain, left 07/20/2012  . Atypical  Parkinsonism 07/20/2012  . Routine health maintenance 08/26/2011  . Murmur 05/27/2011  . HYPERCHOLESTEROLEMIA  IIA 05/14/2010  . VENOUS INSUFFICIENCY, LEGS 06/21/2009  . CORONARY HEART DISEASE 06/09/2008  . Hypothyroidism 05/15/2008  . ASTHMA 05/15/2008  . Depression 11/07/2007  . 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 GERD 11/07/2007  . VAGINITIS, ATROPHIC 11/07/2007    [name] 07/16/2015, 3:06 PM  [facility] [address] [place], [place], [address] Phone: [phone]   Fax:  [place], [place] 07/16/2015 3:07 PM Phone: [phone] Fax: [phone]

## 2015-07-18 ENCOUNTER — Ambulatory Visit: Payer: Medicare Other | Admitting: Physical Therapy

## 2015-07-18 DIAGNOSIS — R269 Unspecified abnormalities of gait and mobility: Secondary | ICD-10-CM | POA: Diagnosis not present

## 2015-07-18 DIAGNOSIS — R279 Unspecified lack of coordination: Secondary | ICD-10-CM

## 2015-07-18 DIAGNOSIS — M6289 Other specified disorders of muscle: Secondary | ICD-10-CM

## 2015-07-18 DIAGNOSIS — R29898 Other symptoms and signs involving the musculoskeletal system: Secondary | ICD-10-CM

## 2015-07-21 ENCOUNTER — Encounter: Payer: Self-pay | Admitting: Physical Therapy

## 2015-07-21 NOTE — Therapy (Signed)
[facility] [address] [place], [place], [address] Phone: [phone]   Fax:  [phone]  Physical Therapy Treatment  Patient Details  Name: [name] [name] MRN: [id number] Date of Birth: 1941-03-10 Referring Provider:  Ria Bush, MD  Encounter Date: 07/18/2015      PT End of Session - 07/21/15 1444    Visit Number 14   Number of Visits 22   Date for PT Re-Evaluation 08/17/15   Authorization Type [employer] [facility]   Authorization Time Period 07-18-15 - 09-16-15   PT Start Time 1335   PT Stop Time 1422   PT Time Calculation (min) 47 min   Equipment Utilized During Treatment Gait belt      Past Medical History  Diagnosis Date  . GERD (gastroesophageal reflux disease)     s/p nissen  . Familial tremor     followed by Dr. Erling Cruz  . Unspecified chronic bronchitis   . Neurogenic bladder     Tannenbaum/MacDiarmid  . History of pneumonia   . Hypothyroidism   . Depression   . Asthma   . CAD (coronary artery disease)     myoview 5/08:  EF 76%, no scar, no ischemia, +ECG changes with exercise;  cath 04/08/07:  pLAD 30%, mLAD 60-70% - med tx.  Marland Kitchen HLD (hyperlipidemia)   . Multiple system atrophy     [name]  . VAGINITIS, ATROPHIC 11/07/2007  . Osteoporosis 2016    DEXA T-4.4 spine deteriorated since 2012    Past Surgical History  Procedure Laterality Date  . Laparoscopic nissen fundoplication    . Cystectomy    . Tumor removal    . Tubal ligation    . Colonoscopy N/A 08/29/2013    int hemm; Inda Castle, MD  . Dexa  12/2014    T -4.4 spine  . Esophagogastroduodenoscopy N/A 01/09/2015    Procedure: ESOPHAGOGASTRODUODENOSCOPY (EGD);  Surgeon: Inda Castle, MD;  Location: [facility];  Service: Endoscopy;  Laterality: N/A;  help with transfers    There were no vitals filed for this visit.  Visit Diagnosis:  Abnormality of gait - Plan: PT plan of care cert/re-cert  Lack of coordination - Plan:  PT plan of care cert/re-cert  Abnormal increased muscle tone - Plan: PT plan of care cert/re-cert  Right leg weakness - Plan: PT plan of care cert/re-cert      Subjective Assessment - 07/21/15 1439    Subjective Pt. states she feels tight today; reports no changes in status   Pertinent History Multiple Systems atrophy diagnosed in May 2014 with initial appt. at [facility] in Aug. 2013;Pt. had UTI on 04-11-15; went to ED - received catherization; states she has had increased weakness and some mild decline in mobility since this problem   Patient Stated Goals improve walking and mobility   Currently in Pain? No/denies                         [facility] Adult PT Treatment/Exercise - 07/21/15 0001    Bed Mobility   Bed Mobility Supine to Sit;Sit to Supine   Supine to Sit 3: Mod assist   Sit to Supine 3: Mod assist   Transfers   Transfers Sit to Stand;Stand to Sit   Sit to Stand 3: Mod assist   Sit to Stand Details Verbal cues for technique;Verbal cues for precautions/safety;Manual facilitation for weight shifting   Stand to Sit 3: Mod assist;4:  Min guard   Ambulation/Gait   Ambulation/Gait Yes   Ambulation/Gait Assistance 4: Min assist;3: Mod assist;4: Min guard  see below   Ambulation/Gait Assistance Details pt's R foot began to freeze after approx. 180' ambulation distance today   Ambulation Distance (Feet) 230 Feet   Assistive device Rolling walker   Ambulation Surface Level;Indoor   Exercises   Exercises Lumbar   Lumbar Exercises: Stretches   Passive Hamstring Stretch 1 rep;20 seconds   Single Knee to Chest Stretch 2 reps;10 seconds   Double Knee to Chest Stretch 1 rep;10 seconds   Lower Trunk Rotation 1 rep;30 seconds   Hip Flexor Stretch 1 rep;20 seconds  in sidelying position       PWR! Exercises in seated position - reaching down and up x 10 reps; trunk rotation x 5 reps to each side with min assist           PT Short Term Goals - 06/28/15 1622     PT SHORT TERM GOAL #1   Title Pt. will increase standing tolerance so she is able to report standing 5" at home with S for independence with ADL's. (06-19-15)   Status Achieved   PT SHORT TERM GOAL #2   Title Increase distance in 3" walk test to >/= 100' for increased amb. speed and endurance.  (06-19-15)   Baseline 06/20/15: 160 feet  in 3 minutes with min guard assit to min assist   Status Achieved   PT SHORT TERM GOAL #3   Title Transfer sit to stand from wheelchair with min assist   (06-19-15)   Status Achieved   PT SHORT TERM GOAL #4   Title Independent in updated HEP (06-19-15)   PT SHORT TERM GOAL #5   Title Improve TUG score to </= 50 secs with RW with CGA (06-19-15)   Baseline 06/20/15: 46.40 sec's with walker   Status Achieved           PT Long Term Goals - 07/21/15 1451    PT LONG TERM GOAL #1   Title Pt. will ambulate 240' nonstop with RW to demo incr. endurance with CGA  (07-19-15)  new date 08-17-15   Time 4   Period Weeks   Status On-going   PT LONG TERM GOAL #2   Title Report ability to stand at least 10" at home for improved standing tolerance  (07-19-15) new date 08-17-15   Time 4   Period Weeks   Status On-going   PT LONG TERM GOAL #3   Title Pt. will transfer sit to to stand 5 times with UE support prn from mat with CGA to demo improved initial standing balance (07-19-15) new date 08-17-15   Time 4   Period Weeks   Status On-going   PT LONG TERM GOAL #4   Title Perform at least 15" on Nustep to demo improved activity tolerance/endurance  (07-19-15)   Status Achieved   PT LONG TERM GOAL #5   Title Pt. will demo technique to assist with step initiation after freezing episode  (07-19-15)   Baseline inconsistently met   Status On-going   PT LONG TERM GOAL #6   Title Improve TUG score to </=48 secs. with RW with CGA.  (07-19-15) new date 08-17-15   Baseline 56.22 secs wtih RW   Time 4   Period Weeks   Status On-going   PT LONG TERM GOAL #7   Title Transfer sit to stand  5 times with CGA for incr safety  with transfers   Status Achieved               Plan - 07/21/15 1445    Clinical Impression Statement Pt's mobility improved after stretching exercises performed in supine; pt cont to fatigue with ambulation at approx. 85' which then results in R foot freezing; needs tactile cues to initiate weight shift for step initiation   Pt will benefit from skilled therapeutic intervention in order to improve on the following deficits Abnormal gait;Decreased coordination;Impaired flexibility;Difficulty walking;Decreased balance;Increased muscle spasms;Impaired tone;Decreased mobility;Decreased strength;Decreased activity tolerance;Decreased range of motion   Rehab Potential Fair   PT Frequency 2x / week   PT Duration 4 weeks   PT Treatment/Interventions ADLs/Self Care Home Management;Therapeutic activities;Patient/family education;Therapeutic exercise;Gait training;Balance training;Stair training;Neuromuscular re-education;Functional mobility training   PT Next Visit Plan cont gait and balance training        Problem List Patient Active Problem List   Diagnosis Date Noted  . Neurogenic bladder   . Acute recurrent cystitis 04/18/2015  . Pedal edema 04/01/2015  . Dyspepsia 01/09/2015  . Constipation 12/14/2014  . Medicare annual wellness visit, subsequent 11/27/2014  . Advanced care planning/counseling discussion 11/27/2014  . Osteoporosis   . B-complex deficiency 06/07/2014  . Recurrent falls 02/06/2014  . Multiple system atrophy   . Dysphagia, unspecified(787.20) 07/28/2013  . Lower extremity edema 09/26/2012  . Shoulder pain, left 07/20/2012  . 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 Routine health maintenance 08/26/2011  . Murmur 05/27/2011  . HYPERCHOLESTEROLEMIA  IIA 05/14/2010  . VENOUS INSUFFICIENCY, LEGS 06/21/2009  . CORONARY HEART DISEASE 06/09/2008  . Hypothyroidism 05/15/2008  . ASTHMA 05/15/2008  . Depression 11/07/2007  . FAMILIAL  TREMOR 11/07/2007  . GERD 11/07/2007  . VAGINITIS, ATROPHIC 11/07/2007    Alda Lea, PT 07/21/2015, 2:59 PM  [facility] [address] [place], [place], [address] Phone: [phone]   Fax:  [phone]

## 2015-07-22 ENCOUNTER — Ambulatory Visit: Payer: Medicare Other | Admitting: Physical Therapy

## 2015-07-22 DIAGNOSIS — R279 Unspecified lack of coordination: Secondary | ICD-10-CM

## 2015-07-22 DIAGNOSIS — M6289 Other specified disorders of muscle: Secondary | ICD-10-CM

## 2015-07-22 DIAGNOSIS — R269 Unspecified abnormalities of gait and mobility: Secondary | ICD-10-CM | POA: Diagnosis not present

## 2015-07-22 DIAGNOSIS — R29898 Other symptoms and signs involving the musculoskeletal system: Secondary | ICD-10-CM

## 2015-07-23 ENCOUNTER — Other Ambulatory Visit: Payer: Self-pay | Admitting: Cardiovascular Disease

## 2015-07-24 ENCOUNTER — Encounter: Payer: Self-pay | Admitting: Physical Therapy

## 2015-07-24 ENCOUNTER — Ambulatory Visit: Payer: Medicare Other | Admitting: Physical Therapy

## 2015-07-24 NOTE — Therapy (Signed)
[facility] [address] [place], [place], [address] Phone: [phone]   Fax:  [phone]  Physical Therapy Treatment  Patient Details  Name: [name] [name] MRN: [id number] Date of Birth: 06-Oct-1941 Referring Provider:  Ria Bush, MD  Encounter Date: 07/22/2015      PT End of Session - 07/24/15 1908    Visit Number 15   Number of Visits 22   Date for PT Re-Evaluation 08/17/15   Authorization Type [employer] [facility]   Authorization Time Period 07-18-15 - 09-16-15   PT Start Time 1535   PT Stop Time 1623   PT Time Calculation (min) 48 min      Past Medical History  Diagnosis Date  . GERD (gastroesophageal reflux disease)     s/p nissen  . Familial tremor     followed by Dr. Erling Cruz  . Unspecified chronic bronchitis   . Neurogenic bladder     Tannenbaum/MacDiarmid  . History of pneumonia   . Hypothyroidism   . Depression   . Asthma   . CAD (coronary artery disease)     myoview 5/08:  EF 76%, no scar, no ischemia, +ECG changes with exercise;  cath 04/08/07:  pLAD 30%, mLAD 60-70% - med tx.  Marland Kitchen HLD (hyperlipidemia)   . Multiple system atrophy     [name]  . VAGINITIS, ATROPHIC 11/07/2007  . Osteoporosis 2016    DEXA T-4.4 spine deteriorated since 2012    Past Surgical History  Procedure Laterality Date  . Laparoscopic nissen fundoplication    . Cystectomy    . Tumor removal    . Tubal ligation    . Colonoscopy N/A 08/29/2013    int hemm; Inda Castle, MD  . Dexa  12/2014    T -4.4 spine  . Esophagogastroduodenoscopy N/A 01/09/2015    Procedure: ESOPHAGOGASTRODUODENOSCOPY (EGD);  Surgeon: Inda Castle, MD;  Location: [facility];  Service: Endoscopy;  Laterality: N/A;  help with transfers    There were no vitals filed for this visit.  Visit Diagnosis:  Abnormality of gait  Right leg weakness  Abnormal increased muscle tone  Lack of coordination      Subjective Assessment -  07/24/15 1903    Subjective Pt. reports no new changes - reports foot has been freezing some today   Pertinent History Multiple Systems atrophy diagnosed in May 2014 with initial appt. at [facility] in Aug. 2013;Pt. had UTI on 04-11-15; went to ED - received catherization; states she has had increased weakness and some mild decline in mobility since this problem   Patient Stated Goals improve walking and mobility   Currently in Pain? No/denies                         [facility] Adult PT Treatment/Exercise - 07/24/15 0001    Bed Mobility   Bed Mobility Supine to Sit;Sit to Supine   Supine to Sit 4: Min assist   Sit to Supine 3: Mod assist   Transfers   Transfers Sit to Stand;Stand to Sit   Sit to Stand 3: Mod assist   Sit to Stand Details Verbal cues for technique;Verbal cues for precautions/safety;Manual facilitation for weight shifting  needs mod assist to shift weight anteriorly   Stand to Sit 3: Mod assist;4: Min guard   Stand to Sit Details (indicate cue type and reason) Verbal cues for technique;Verbal cues for precautions/safety   Stand to Sit Details  needs assist to control descent   Ambulation/Gait   Ambulation/Gait Yes   Ambulation/Gait Assistance 4: Min assist;3: Mod assist  mod assist needed when fatigued due to incr freezing R foot   Ambulation/Gait Assistance Details R foot freezes when pt gets fatigued - after 175' approx.   Ambulation Distance (Feet) 230 Feet   Assistive device Rolling walker   Gait Pattern Wide base of support;Decreased hip/knee flexion - right;Decreased hip/knee flexion - left;Decreased stride length;Step-through pattern;Abducted- right;Decreased trunk rotation   Ambulation Surface Level;Indoor   Lumbar Exercises: Stretches   Passive Hamstring Stretch 1 rep;20 seconds   Single Knee to Chest Stretch 2 reps;10 seconds   Lower Trunk Rotation 1 rep;30 seconds   Lower Trunk Rotation Limitations L side tighter than R side   Hip Flexor Stretch 1  rep;20 seconds  in supine position - leg off side of mat   Piriformis Stretch 2 reps;30 seconds  bil. LE's                  PT Short Term Goals - 06/28/15 1622    PT SHORT TERM GOAL #1   Title Pt. will increase standing tolerance so she is able to report standing 5" at home with S for independence with ADL's. (06-19-15)   Status Achieved   PT SHORT TERM GOAL #2   Title Increase distance in 3" walk test to >/= 100' for increased amb. speed and endurance.  (06-19-15)   Baseline 06/20/15: 160 feet  in 3 minutes with min guard assit to min assist   Status Achieved   PT SHORT TERM GOAL #3   Title Transfer sit to stand from wheelchair with min assist   (06-19-15)   Status Achieved   PT SHORT TERM GOAL #4   Title Independent in updated HEP (06-19-15)   PT SHORT TERM GOAL #5   Title Improve TUG score to </= 50 secs with RW with CGA (06-19-15)   Baseline 06/20/15: 46.40 sec's with walker   Status Achieved           PT Long Term Goals - 07/21/15 1451    PT LONG TERM GOAL #1   Title Pt. will ambulate 240' nonstop with RW to demo incr. endurance with CGA  (07-19-15)  new date 08-17-15   Time 4   Period Weeks   Status On-going   PT LONG TERM GOAL #2   Title Report ability to stand at least 10" at home for improved standing tolerance  (07-19-15) new date 08-17-15   Time 4   Period Weeks   Status On-going   PT LONG TERM GOAL #3   Title Pt. will transfer sit to to stand 5 times with UE support prn from mat with CGA to demo improved initial standing balance (07-19-15) new date 08-17-15   Time 4   Period Weeks   Status On-going   PT LONG TERM GOAL #4   Title Perform at least 15" on Nustep to demo improved activity tolerance/endurance  (07-19-15)   Status Achieved   PT LONG TERM GOAL #5   Title Pt. will demo technique to assist with step initiation after freezing episode  (07-19-15)   Baseline inconsistently met   Status On-going   PT LONG TERM GOAL #6   Title Improve TUG score to </=48  secs. with RW with CGA.  (07-19-15) new date 08-17-15   Baseline 56.22 secs wtih RW   Time 4   Period Weeks   Status On-going  PT LONG TERM GOAL #7   Title Transfer sit to stand 5 times with CGA for incr safety with transfers   Status Achieved               Plan - 07/24/15 1909    Clinical Impression Statement Pt reports feeling much looser and incr. ease with movement after supine stretching exercises; pt cont to lose balance posteriorly with sit to stand transfers   Pt will benefit from skilled therapeutic intervention in order to improve on the following deficits Abnormal gait;Decreased coordination;Impaired flexibility;Difficulty walking;Decreased balance;Increased muscle spasms;Impaired tone;Decreased mobility;Decreased strength;Decreased activity tolerance;Decreased range of motion   Rehab Potential Fair   PT Frequency 2x / week   PT Duration 4 weeks   PT Treatment/Interventions ADLs/Self Care Home Management;Therapeutic activities;Patient/family education;Therapeutic exercise;Gait training;Balance training;Stair training;Neuromuscular re-education;Functional mobility training   PT Next Visit Plan cont gait and balance training   Consulted and Agree with Plan of Care Patient        Problem List Patient Active Problem List   Diagnosis Date Noted  . Neurogenic bladder   . Acute recurrent cystitis 04/18/2015  . Pedal edema 04/01/2015  . Dyspepsia 01/09/2015  . Constipation 12/14/2014  . Medicare annual wellness visit, subsequent 11/27/2014  . Advanced care planning/counseling discussion 11/27/2014  . Osteoporosis   . B-complex deficiency 06/07/2014  . Recurrent falls 02/06/2014  . Multiple system atrophy   . Dysphagia, unspecified(787.20) 07/28/2013  . Lower extremity edema 09/26/2012  . Shoulder pain, left 07/20/2012  . 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 Routine health maintenance 08/26/2011  . Murmur 05/27/2011  . HYPERCHOLESTEROLEMIA  IIA 05/14/2010  . VENOUS  INSUFFICIENCY, LEGS 06/21/2009  . CORONARY HEART DISEASE 06/09/2008  . Hypothyroidism 05/15/2008  . ASTHMA 05/15/2008  . Depression 11/07/2007  . 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 GERD 11/07/2007  . VAGINITIS, ATROPHIC 11/07/2007    Alda Lea, PT 07/24/2015, 7:18 PM  [facility] [address] [place], [place], [address] Phone: [phone]   Fax:  [phone]

## 2015-07-25 ENCOUNTER — Ambulatory Visit (INDEPENDENT_AMBULATORY_CARE_PROVIDER_SITE_OTHER): Payer: Medicare Other | Admitting: Gastroenterology

## 2015-07-25 ENCOUNTER — Ambulatory Visit: Payer: Medicare Other | Admitting: Physical Therapy

## 2015-07-25 ENCOUNTER — Encounter: Payer: Self-pay | Admitting: Gastroenterology

## 2015-07-25 VITALS — BP 140/74 | HR 76

## 2015-07-25 DIAGNOSIS — R269 Unspecified abnormalities of gait and mobility: Secondary | ICD-10-CM | POA: Diagnosis not present

## 2015-07-25 DIAGNOSIS — K5900 Constipation, unspecified: Secondary | ICD-10-CM | POA: Diagnosis not present

## 2015-07-25 DIAGNOSIS — R278 Other lack of coordination: Secondary | ICD-10-CM

## 2015-07-25 DIAGNOSIS — M6289 Other specified disorders of muscle: Secondary | ICD-10-CM

## 2015-07-25 NOTE — Progress Notes (Signed)
      History of Present Illness:  Ms. Bera complaining of spasm-like discomfort in the left and right sides along the waist that is decreased with hyomax.  Spasms have occurred in the setting of constipation which is rather chronic pain tends to lessen when she has a bowel movement which is still irregular.  She was diagnosed with multi-system atrophy which has led to increased instability.  She also complains of excess belching and gas with some abdominal distention.  Colonoscopy in 2014 was normal except for hemorrhoids.  Upper endoscopy earlier this year was negative.  Gastric emptying scan was also unremarkable.    Review of Systems: Pertinent positive and negative review of systems were noted in the above HPI section. All other review of systems were otherwise negative.    Current Medications, Allergies, Past Medical History, Past Surgical History, Family History and Social History were reviewed in [facility] record  Vital signs were reviewed in today's medical record. Physical Exam: General: Well developed , well nourished, no acute distress   See Assessment and Plan under Problem List

## 2015-07-25 NOTE — Assessment & Plan Note (Signed)
I suspect that abdominal pain is related to chronic constipation.  This in turn is likely complication of her neurologic disorder.  We had a long discussion (greater than 15 minutes) about this.  I advised her to begin MiraLAX daily and to titrate MiraLAX to achieve a bowel movement either every day or every other day.  If she continues with constipation despite MiraLAX I will try her on Linzess.

## 2015-07-25 NOTE — Patient Instructions (Signed)
Follow up as needed

## 2015-07-26 ENCOUNTER — Other Ambulatory Visit: Payer: Self-pay | Admitting: Gastroenterology

## 2015-07-28 ENCOUNTER — Encounter: Payer: Self-pay | Admitting: Physical Therapy

## 2015-07-28 NOTE — Therapy (Signed)
[facility] [address] [place], [place], [address] Phone: [phone]   Fax:  [phone]  Physical Therapy Treatment  Patient Details  Name: [name] [name] MRN: [id number] Date of Birth: 09-Dec-1940 Referring Provider:  Ria Bush, MD  Encounter Date: 07/25/2015      PT End of Session - 07/28/15 2202    Visit Number 16   Number of Visits 22   Date for PT Re-Evaluation 08/17/15   Authorization Type [employer] MCR   Authorization Time Period 07-18-15 - 09-16-15   PT Start Time 1402   PT Stop Time 1500   PT Time Calculation (min) 58 min   Equipment Utilized During Treatment Gait belt      Past Medical History  Diagnosis Date  . GERD (gastroesophageal reflux disease)     s/p nissen  . Familial tremor     followed by Dr. Erling Cruz  . Unspecified chronic bronchitis   . Neurogenic bladder     Tannenbaum/MacDiarmid  . History of pneumonia   . Hypothyroidism   . Depression   . Asthma   . CAD (coronary artery disease)     myoview 5/08:  EF 76%, no scar, no ischemia, +ECG changes with exercise;  cath 04/08/07:  pLAD 30%, mLAD 60-70% - med tx.  Marland Kitchen HLD (hyperlipidemia)   . Multiple system atrophy     [name]  . VAGINITIS, ATROPHIC 11/07/2007  . Osteoporosis 2016    DEXA T-4.4 spine deteriorated since 2012  . UTI (urinary tract infection)     freq with her diease    Past Surgical History  Procedure Laterality Date  . Laparoscopic nissen fundoplication    . Cystectomy    . Tumor removal    . Tubal ligation    . Colonoscopy N/A 08/29/2013    int hemm; Inda Castle, MD  . Dexa  12/2014    T -4.4 spine  . Esophagogastroduodenoscopy N/A 01/09/2015    Procedure: ESOPHAGOGASTRODUODENOSCOPY (EGD);  Surgeon: Inda Castle, MD;  Location: [facility];  Service: Endoscopy;  Laterality: N/A;  help with transfers    There were no vitals filed for this visit.  Visit Diagnosis:  Abnormality of  gait  Abnormal increased muscle tone  Incoordination of extremity      Subjective Assessment - 07/28/15 2154    Subjective Pt reports she has been having some trouble with walking and R foot freezing today "not a real good day"   Pertinent History Multiple Systems atrophy diagnosed in May 2014 with initial appt. at [facility] clinic in Aug. 2013;Pt. had UTI on 04-11-15; went to ED - received catherization; states she has had increased weakness and some mild decline in mobility since this problem   Patient Stated Goals improve walking and mobility   Currently in Pain? No/denies                         [facility] Adult PT Treatment/Exercise - 07/28/15 0001    Bed Mobility   Bed Mobility Supine to Sit;Sit to Supine   Supine to Sit 4: Min assist   Sit to Supine 4: Min assist   Transfers   Transfers Sit to Stand;Stand to Sit   Sit to Stand 3: Mod assist   Sit to Stand Details Verbal cues for technique;Verbal cues for precautions/safety;Manual facilitation for weight shifting  needs mod assist to shift weight anteriorly   Stand to Sit 3: Mod assist;4: Min  guard   Stand to Sit Details (indicate cue type and reason) Verbal cues for technique;Verbal cues for precautions/safety   Transfers Sit to Stand;Stand to Sit   Sit to Stand 4: Min assist;3: Mod assist   Ambulation/Gait   Ambulation/Gait Yes   Ambulation/Gait Assistance 4: Min assist;3: Mod assist  mod assist needed when fatigued due to incr freezing R foot   Ambulation Distance (Feet) 240 Feet   Assistive device Rolling walker   Gait Pattern Wide base of support;Decreased hip/knee flexion - right;Decreased hip/knee flexion - left;Decreased stride length;Step-through pattern;Abducted- right;Decreased trunk rotation   Ambulation Surface Level;Indoor   Neuro Re-ed    Neuro Re-ed Details  Pt performed seated PWR! reach exercise to floor and up overhead to fasciltate anterior weight shift and trunk extension x 10 reps; also performed  seated lateral trun rotation with hands meeting together on either side 10 reps to R and L as able   Exercises   Exercises Lumbar   Lumbar Exercises: Stretches   Single Knee to Chest Stretch 2 reps;10 seconds   Double Knee to Chest Stretch 1 rep;10 seconds   Lower Trunk Rotation 1 rep;30 seconds   Lower Trunk Rotation Limitations L side tighter than R side                  PT Short Term Goals - 06/28/15 1622    PT SHORT TERM GOAL #1   Title Pt. will increase standing tolerance so she is able to report standing 5" at home with S for independence with ADL's. (06-19-15)   Status Achieved   PT SHORT TERM GOAL #2   Title Increase distance in 3" walk test to >/= 100' for increased amb. speed and endurance.  (06-19-15)   Baseline 06/20/15: 160 feet  in 3 minutes with min guard assit to min assist   Status Achieved   PT SHORT TERM GOAL #3   Title Transfer sit to stand from wheelchair with min assist   (06-19-15)   Status Achieved   PT SHORT TERM GOAL #4   Title Independent in updated HEP (06-19-15)   PT SHORT TERM GOAL #5   Title Improve TUG score to </= 50 secs with RW with CGA (06-19-15)   Baseline 06/20/15: 46.40 sec's with walker   Status Achieved           PT Long Term Goals - 07/21/15 1451    PT LONG TERM GOAL #1   Title Pt. will ambulate 240' nonstop with RW to demo incr. endurance with CGA  (07-19-15)  new date 08-17-15   Time 4   Period Weeks   Status On-going   PT LONG TERM GOAL #2   Title Report ability to stand at least 10" at home for improved standing tolerance  (07-19-15) new date 08-17-15   Time 4   Period Weeks   Status On-going   PT LONG TERM GOAL #3   Title Pt. will transfer sit to to stand 5 times with UE support prn from mat with CGA to demo improved initial standing balance (07-19-15) new date 08-17-15   Time 4   Period Weeks   Status On-going   PT LONG TERM GOAL #4   Title Perform at least 15" on Nustep to demo improved activity tolerance/endurance   (07-19-15)   Status Achieved   PT LONG TERM GOAL #5   Title Pt. will demo technique to assist with step initiation after freezing episode  (07-19-15)   Baseline inconsistently  met   Status On-going   PT LONG TERM GOAL #6   Title Improve TUG score to </=48 secs. with RW with CGA.  (07-19-15) new date 08-17-15   Baseline 56.22 secs wtih RW   Time 4   Period Weeks   Status On-going   PT LONG TERM GOAL #7   Title Transfer sit to stand 5 times with CGA for incr safety with transfers   Status Achieved               Plan - 07/28/15 2202    Clinical Impression Statement Pt.'s gait slower today due to incr. 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    Pt will benefit from skilled therapeutic intervention in order to improve on the following deficits Abnormal gait;Decreased coordination;Impaired flexibility;Difficulty walking;Decreased balance;Increased muscle spasms;Impaired tone;Decreased mobility;Decreased strength;Decreased activity tolerance;Decreased range of motion   Rehab Potential Fair   PT Frequency 2x / week   PT Duration 4 weeks   PT Treatment/Interventions ADLs/Self Care Home Management;Therapeutic activities;Patient/family education;Therapeutic exercise;Gait training;Balance training;Stair training;Neuromuscular re-education;Functional mobility training   PT Next Visit Plan cont gait and balance training   Consulted and Agree with Plan of Care Patient        Problem List Patient Active Problem List   Diagnosis Date Noted  . Neurogenic bladder   . Acute recurrent cystitis 04/18/2015  . Pedal edema 04/01/2015  . Dyspepsia 01/09/2015  . Constipation 12/14/2014  . Medicare annual wellness visit, subsequent 11/27/2014  . Advanced care planning/counseling discussion 11/27/2014  . Osteoporosis   . B-complex deficiency 06/07/2014  . Recurrent falls 02/06/2014  . Multiple system atrophy   . Dysphagia, unspecified(787.20) 07/28/2013  . Lower  extremity edema 09/26/2012  . Shoulder pain, left 07/20/2012  . 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 Routine health maintenance 08/26/2011  . Murmur 05/27/2011  . HYPERCHOLESTEROLEMIA  IIA 05/14/2010  . VENOUS INSUFFICIENCY, LEGS 06/21/2009  . CORONARY HEART DISEASE 06/09/2008  . Hypothyroidism 05/15/2008  . ASTHMA 05/15/2008  . Depression 11/07/2007  . 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 GERD 11/07/2007  . VAGINITIS, ATROPHIC 11/07/2007    Alda Lea, PT 07/28/2015, 10:07 PM  [facility] [address] [place], [place], [address] Phone: [phone]   Fax:  [phone]

## 2015-07-29 ENCOUNTER — Ambulatory Visit (INDEPENDENT_AMBULATORY_CARE_PROVIDER_SITE_OTHER): Payer: Medicare Other | Admitting: Neurology

## 2015-07-29 ENCOUNTER — Encounter: Payer: Self-pay | Admitting: Neurology

## 2015-07-29 ENCOUNTER — Ambulatory Visit: Payer: Medicare Other | Admitting: Physical Therapy

## 2015-07-29 VITALS — BP 126/72 | HR 78 | Resp 16

## 2015-07-29 DIAGNOSIS — G20 Parkinson's disease: Secondary | ICD-10-CM

## 2015-07-29 DIAGNOSIS — R296 Repeated falls: Secondary | ICD-10-CM

## 2015-07-29 DIAGNOSIS — E538 Deficiency of other specified B group vitamins: Secondary | ICD-10-CM

## 2015-07-29 DIAGNOSIS — G232 Striatonigral degeneration: Secondary | ICD-10-CM

## 2015-07-29 DIAGNOSIS — R279 Unspecified lack of coordination: Secondary | ICD-10-CM

## 2015-07-29 DIAGNOSIS — R269 Unspecified abnormalities of gait and mobility: Secondary | ICD-10-CM

## 2015-07-29 DIAGNOSIS — M6289 Other specified disorders of muscle: Secondary | ICD-10-CM

## 2015-07-29 MED ORDER — CYANOCOBALAMIN 1000 MCG/ML IJ SOLN
1000.0000 ug | Freq: Once | INTRAMUSCULAR | Status: AC
  Administered 2015-07-29: 1000 ug via INTRAMUSCULAR

## 2015-07-29 NOTE — Addendum Note (Signed)
Addended by: Laurence Spates on: 07/29/2015 04:47 PM   Modules accepted: Orders

## 2015-07-29 NOTE — Patient Instructions (Signed)
Continue therapy.  Drink plenty of fluid, balanced diet.  Follow up in 6 months.

## 2015-07-29 NOTE — Therapy (Signed)
[facility] [address] [place], [place], [address] Phone: [phone]   Fax:  [phone]  Physical Therapy Treatment  Patient Details  Name: [name] [name] MRN: [id number] Date of Birth: 1941/01/16 Referring Provider:  Ria Bush, MD  Encounter Date: 07/29/2015      PT End of Session - 07/28/15 2202    Visit Number 16   Number of Visits 22   Date for PT Re-Evaluation 08/17/15   Authorization Type UHC MCR   Authorization Time Period 07-18-15 - 09-16-15   PT Start Time 1402   PT Stop Time 1500   PT Time Calculation (min) 58 min   Equipment Utilized During Treatment Gait belt      Past Medical History  Diagnosis Date  . GERD (gastroesophageal reflux disease)     s/p nissen  . Familial tremor     followed by Dr. Erling Cruz  . Unspecified chronic bronchitis   . Neurogenic bladder     Tannenbaum/MacDiarmid  . History of pneumonia   . Hypothyroidism   . Depression   . Asthma   . CAD (coronary artery disease)     myoview 5/08:  EF 76%, no scar, no ischemia, +ECG changes with exercise;  cath 04/08/07:  pLAD 30%, mLAD 60-70% - med tx.  Marland Kitchen HLD (hyperlipidemia)   . Multiple system atrophy     [name]  . VAGINITIS, ATROPHIC 11/07/2007  . Osteoporosis 2016    DEXA T-4.4 spine deteriorated since 2012  . UTI (urinary tract infection)     freq with her diease    Past Surgical History  Procedure Laterality Date  . Laparoscopic nissen fundoplication    . Cystectomy    . Tumor removal    . Tubal ligation    . Colonoscopy N/A 08/29/2013    int hemm; Inda Castle, MD  . Dexa  12/2014    T -4.4 spine  . Esophagogastroduodenoscopy N/A 01/09/2015    Procedure: ESOPHAGOGASTRODUODENOSCOPY (EGD);  Surgeon: Inda Castle, MD;  Location: [facility];  Service: Endoscopy;  Laterality: N/A;  help with transfers    There were no vitals filed for this visit.  Visit Diagnosis:  Abnormal increased muscle  tone  Abnormality of gait  Lack of coordination      Subjective Assessment - 07/28/15 2154    Subjective Pt reports she has been having some trouble with walking and R foot freezing today "not a real good day"   Pertinent History Multiple Systems atrophy diagnosed in May 2014 with initial appt. at [facility] clinic in Aug. 2013;Pt. had UTI on 04-11-15; went to ED - received catherization; states she has had increased weakness and some mild decline in mobility since this problem   Patient Stated Goals improve walking and mobility   Currently in Pain? No/denies       Pt. Arrived for PT - stated she would need to leave 15-20" early due to MD appt. With neurologist at 3:30 and pt requesting to allow time  To use restroom prior to that appt. - pt was set up on [employer] (performed unsupervised) so no charge for today's session due to lack of time                            PT Short Term Goals - 06/28/15 1622    PT SHORT TERM GOAL #1   Title Pt. will increase standing tolerance so she is  able to report standing 5" at home with S for independence with ADL's. (06-19-15)   Status Achieved   PT SHORT TERM GOAL #2   Title Increase distance in 3" walk test to >/= 100' for increased amb. speed and endurance.  (06-19-15)   Baseline 06/20/15: 160 feet  in 3 minutes with min guard assit to min assist   Status Achieved   PT SHORT TERM GOAL #3   Title Transfer sit to stand from wheelchair with min assist   (06-19-15)   Status Achieved   PT SHORT TERM GOAL #4   Title Independent in updated HEP (06-19-15)   PT SHORT TERM GOAL #5   Title Improve TUG score to </= 50 secs with RW with CGA (06-19-15)   Baseline 06/20/15: 46.40 sec's with walker   Status Achieved           PT Long Term Goals - 07/21/15 1451    PT LONG TERM GOAL #1   Title Pt. will ambulate 240' nonstop with RW to demo incr. endurance with CGA  (07-19-15)  new date 08-17-15   Time 4   Period Weeks   Status On-going   PT  LONG TERM GOAL #2   Title Report ability to stand at least 10" at home for improved standing tolerance  (07-19-15) new date 08-17-15   Time 4   Period Weeks   Status On-going   PT LONG TERM GOAL #3   Title Pt. will transfer sit to to stand 5 times with UE support prn from mat with CGA to demo improved initial standing balance (07-19-15) new date 08-17-15   Time 4   Period Weeks   Status On-going   PT LONG TERM GOAL #4   Title Perform at least 15" on Nustep to demo improved activity tolerance/endurance  (07-19-15)   Status Achieved   PT LONG TERM GOAL #5   Title Pt. will demo technique to assist with step initiation after freezing episode  (07-19-15)   Baseline inconsistently met   Status On-going   PT LONG TERM GOAL #6   Title Improve TUG score to </=48 secs. with RW with CGA.  (07-19-15) new date 08-17-15   Baseline 56.22 secs wtih RW   Time 4   Period Weeks   Status On-going   PT LONG TERM GOAL #7   Title Transfer sit to stand 5 times with CGA for incr safety with transfers   Status Achieved               Plan - 07/28/15 2202    Clinical Impression Statement Pt.'s gait slower today due to incr. 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    Pt will benefit from skilled therapeutic intervention in order to improve on the following deficits Abnormal gait;Decreased coordination;Impaired flexibility;Difficulty walking;Decreased balance;Increased muscle spasms;Impaired tone;Decreased mobility;Decreased strength;Decreased activity tolerance;Decreased range of motion   Rehab Potential Fair   PT Frequency 2x / week   PT Duration 4 weeks   PT Treatment/Interventions ADLs/Self Care Home Management;Therapeutic activities;Patient/family education;Therapeutic exercise;Gait training;Balance training;Stair training;Neuromuscular re-education;Functional mobility training   PT Next Visit Plan cont gait and balance training   Consulted and Agree with Plan of Care  Patient        Problem List Patient Active Problem List   Diagnosis Date Noted  . Neurogenic bladder   . Acute recurrent cystitis 04/18/2015  . Pedal edema 04/01/2015  . Dyspepsia 01/09/2015  . Constipation 12/14/2014  . Medicare  annual wellness visit, subsequent 11/27/2014  . Advanced care planning/counseling discussion 11/27/2014  . Osteoporosis   . B-complex deficiency 06/07/2014  . Recurrent falls 02/06/2014  . Multiple system atrophy   . Dysphagia, unspecified(787.20) 07/28/2013  . Lower extremity edema 09/26/2012  . Shoulder pain, left 07/20/2012  . 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 Routine health maintenance 08/26/2011  . Murmur 05/27/2011  . HYPERCHOLESTEROLEMIA  IIA 05/14/2010  . VENOUS INSUFFICIENCY, LEGS 06/21/2009  . CORONARY HEART DISEASE 06/09/2008  . Hypothyroidism 05/15/2008  . ASTHMA 05/15/2008  . Depression 11/07/2007  . 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 GERD 11/07/2007  . VAGINITIS, ATROPHIC 11/07/2007    Alda Lea, PT 07/29/2015, 4:20 PM  [facility] [address] [place], [place], [address] Phone: [phone]   Fax:  [phone]

## 2015-07-29 NOTE — Progress Notes (Signed)
Subjective:    Patient ID: [name] [name] is a 74 y.o. female.  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 [name] [name] is accompanied by [name] [name] husband again today. I last saw [name] [name] on 03/27/2015, at which time [name] [name] reported new left hand numbness for the past 1 month. [name] [name] woke up with left hand symptoms. [name] [name] had some numbness and tingling. [name] [name] husband noted that [name] [name] was leaning to the left. [name] [name] last brain MRI was on 07/30/2015 which showed no acute abnormality. I reviewed the MRI last time. [name] [name] denied any slurring of speech, droopy face, lower extremity weakness. [name] [name] had some lower extremity swelling which was worse. [name] [name] left hand numbness improved after a few days. [name] [name] leaning improved. [name] [name] had finished physical therapy in April 2016 and felt it was helpful and wondered if [name] [name] could restart it. I referred [name] [name] back to physical therapy outpatient. I suggested we proceed with a brain MRI. [name] [name] had a brain MRI without contrast on 04/23/2015 and we called [name] [name] subsequently with [name] [name] test results: Abnormal MRI brain (without) demonstrating:  1. Small left fronto-parietal, parasagittal focus of encephalomalacia and gliosis, possible a chronic ischemic infarction.   2. Few scattered periventricular foci of non-specific gliosis. 3. No acute findings. 4. No significant change from MRI on 07/29/12.  In addition, I personally reviewed the images through the PACS system.  Today, 07/29/2015: [name] [name] reports doing fairly well. No choking, no recent falls and he helps with [name] [name] mobility with the walker. [name] [name] went to the emergency room in June for UTI. I reviewed the  emergency room records from 04/11/2015. [name] [name] is in outpatient physical therapy with Vinnie Level at the rehabilitation unit next door. I reviewed the visit note with PT from 07/28/2015. [name] [name] saw [name] [name] urologist, Dr. Gaynelle Arabian this month, he has talked to [name] [name] about Botox injections and potentially a surgically placed catheter. [name] [name] has no HAs. [name] [name] is hesitant to consider it. [name] [name] is doing well in therapy. They did get a phone call from the [facility] for a check in and there was a research study, but he discouraged [name] [name] from it, as there were some problems with the study, from what I understand.   Previously:   I saw [name] [name] on 12/11/2014, at which time [name] [name] reported feeling about the same as far as [name] [name] motor symptoms. [name] [name] had more freezing, especially in [name] [name] right leg. [name] [name] was in physical therapy and felt it was helpful. [name] [name] was trying to do stretching at home. [name] [name] husband reported that he does not feel comfortable leaving [name] [name] at home alone. They were in touch with [facility] over the phone but there were no updates on research for multiple system atrophy. [name] [name] has a call alert button. [name] [name] had no recent falls. [name] [name] was having trouble going to sleep. [name] [name] was sleeping in [name] [name] lift chair. [name] [name] had new reading glasses. [name] [name] had diarrhea in the past when [name] [name] tried Linzess through [name] [name] GI doctor. [name] [name] requested a sooner than scheduled appointment due to abnormal posture and left-sided numbness.  I saw [name] [name] on 07/05/2014, at which time [name] [name] reported having had more falls. [name] [name] had not been seen at the [facility] clinic. Stem  cell trial was discussed and another trial in [place] with alpha-synuclein depleting therapy at the last visit there. [name] [name] was eating well and [name] [name] spirits were good. We restarted outpatient physical therapy and [name] [name] has been going to the neuro rehabilitation unit.    I saw [name] [name] on 02/02/2014, at which time [name] [name] reported that physical therapy was helpful. [name] [name] had fallen a few times and bruised [name] [name] ribs. [name] [name] had a colonoscopy on  08/29/2013. [name] [name] reported a history of hemorrhoids and chronic constipation for which [name] [name] had tried Dulcolax. I referred [name] [name] back to physical therapy. 4 chronic constipation I asked [name] [name] to try Linzess. I did not suggest any other new medications. [name] [name] goes to the [facility] once a year and had an appointment in on 04/27/14 and I reviewed the note and shared the data with them. [name] [name] had previously tried and failed Sinemet.   I saw [name] [name] on 09/05/2013, at which time [name] [name] presented for a sooner than scheduled appointment due to recurrent falls. [name] [name] had fallen 6 times within about 1-1/2 weeks' time. 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 I advised [name] [name] to use [name] [name] walker at all times. We talked about potentially going to a motorized wheelchair down the [place]. [name] [name] was advised to drink more water. [name] [name] had tried and failed Sinemet in the past. I suggested a second opinion at [facility] which they declined. I referred [name] [name] to physical and occupational therapy outpatient. In the interim, I prescribed a light weight wheelchair for [name] [name]. [name] [name] daughter recently died. [name] [name] had advanced MS.   I first met [name] [name] on 05/10/2013, at which time we discussed the possibility of MSA. I did not make any medication changes. We talked about the nature of progressive parkinsonism and gait disorder. [name] [name] had [name] [name] last outpatient PT in July 2014. [name] [name] tried to do stretching at home. [name] [name] had a colonoscopy.   [name] [name] previously followed with Dr. Morene Antu and was last seen by him on 12/30/2012, at which time he felt that [name] [name] had worsening of [name] [name] gait. [name] [name] also had symptoms of vertigo. [name] [name] has been on baby aspirin, Synthroid, Crestor, and/or, clonazepam, propranolol, sertraline, trimethoprim, Nasonex, Provera, nitroglycerin as needed, vitamin B12.   [name] [name] was diagnosed about 20 years ago with essential tremor. [name] [name] had head tremor more than upper  extremity tremor. [name] [name] has a history of lung disease but was able to take low-dose propranolol. [name] [name] has not tried primidone. [name] [name] has had gait and balance problems in the last 5+ years approximately. [name] [name] has to hold onto something. [name] [name] has had bladder incontinence since [name] [name] 49s. [name] [name] was on Crestor for 3 years which was discontinued in June 2011. CK was normal and EMG nerve conduction studies were normal in June 2011. Crestor was restarted. [name] [name] has been on B12 injections without subjective improvement. MRI brain with and without contrast showed hyperostosis frontalis, MRI C-spine showed mild degenerative joint disease. 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 Lumbar spine MRI from July 2012 showed mild degenerative disc disease. 4 gait dysfunction [name] [name] was referred to Dr. Gilford Rile with a question of myelopathy versus orthostatic tremor versus lower half parkinsonism versus essential tremor with gait disorder. 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 Further testing revealed negative tilt table test but there was mild autonomic neuropathy per autonomic reflex screen. [name] [name] has had swelling in both legs with negative Doppler of the legs and normal echocardiogram. Additional workup at [facility] included vitamin D, paraneoplastic antibodies, anti-GAD, all negative.   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 [name] [name] has been using a 2 wheeled walker for the past 2 years.   [name] [name] has been in PT and OT with improvement.   [name] [name] endorsed a lot of stress, due primarily to [name] [name] mother's and daughter's health, who died in Feb 02, 2014. Mother is in [name] [name] 43s and in a NH. [name] [name] sleeps in a recliner, d/t pain in [name] [name] trunk, back, neck and  stiffness and difficulty getting in and out of bed.  [name] [name] Past Medical History Is Significant For: Past Medical History  Diagnosis Date  . GERD (gastroesophageal reflux disease)     s/p nissen  . Familial tremor     followed by Dr. Erling Cruz  . Unspecified chronic bronchitis   . Neurogenic bladder     Tannenbaum/MacDiarmid  . History of pneumonia   . Hypothyroidism   . Depression   . Asthma   . CAD (coronary artery disease)     myoview 5/08:  EF 76%, no scar, no ischemia, +ECG changes with exercise;  cath 04/08/07:  pLAD 30%, mLAD 60-70% - med tx.  Marland Kitchen HLD (hyperlipidemia)   . Multiple system atrophy     [name]  . VAGINITIS, ATROPHIC 11/07/2007  . Osteoporosis 2016    DEXA T-4.4 spine deteriorated since 2012  . UTI (urinary tract infection)     freq with [name] [name] diease    [name] [name] Past Surgical History Is Significant For: Past Surgical History  Procedure Laterality Date  . Laparoscopic nissen fundoplication    . Cystectomy    . Tumor removal    . Tubal ligation    . Colonoscopy N/A 08/29/2013    int hemm; Inda Castle, MD  . Dexa  12/2014    T -4.4 spine  . Esophagogastroduodenoscopy N/A 01/09/2015    Procedure: ESOPHAGOGASTRODUODENOSCOPY (EGD);  Surgeon: Inda Castle, MD;  Location: [facility];  Service: Endoscopy;  Laterality: N/A;  help with transfers    [name] [name] Family History Is Significant For: Family History  Problem Relation Age of Onset  . Coronary artery disease Father   . Heart attack Father   . Diabetes Daughter   . Breast cancer Maternal Aunt   . Hypertension Mother   . Heart disease Father   . Coronary artery disease Daughter   . Colon cancer Maternal Aunt   . Multiple sclerosis Daughter   . Stroke Daughter   . Heart attack Daughter     [name] [name] Social History Is Significant For: Social History   Social History  . Marital Status: Married    Spouse Name: [name]  . Number of Children: 2  . Years of Education: HS   Occupational History  .  Other    Social History Main Topics  . Smoking status: Never Smoker   . Smokeless tobacco: Never Used  . Alcohol Use: No  . Drug Use: No  . Sexual Activity: Not Currently   Other Topics Concern  . None   Social History Narrative   HSG, retired from [employer] . married '60. 1  son - '65; 1 daughter - '61; 2 grandchildren; 1 great-grandchild. Homemaker. marriage in good health. primary care-giver for [name] [name] mother. Positive history of passive tobacco smoke exposure.   Caffeine Use: 1 cup daily; 2 glasses of tea daily    [name] [name] Allergies Are:  Allergies  Allergen Reactions  . Keflex [Cephalexin] Diarrhea  :   [name] [name] Current Medications Are:  Outpatient Encounter Prescriptions as of 07/29/2015  Medication Sig  . AFLURIA PRESERVATIVE FREE injection   . albuterol (PROAIR HFA) 108 (90 BASE) MCG/ACT inhaler Inhale 2 puffs into the lungs every 6 (six) hours as needed for wheezing or shortness of breath.   Marland Kitchen alum & mag hydroxide-simeth (MYLANTA) 814-481-85 MG/5ML suspension Take 15 mLs by mouth every 6 (six) hours as needed for indigestion or heartburn.  Marland Kitchen aspirin (ASPIRIN EC) 81 MG EC tablet Take 81 mg by mouth 2 (two) times a week. Swallow whole. Take at bedtime  . beclomethasone (QVAR) 80 MCG/ACT inhaler Inhale 1 puff into the lungs daily as needed (wheezing).   . Calcium Carbonate-Vitamin D (CALCIUM-VITAMIN D) 500-200 MG-UNIT per tablet Take 1 tablet by mouth daily.    . ciprofloxacin (CIPRO) 250 MG tablet Take 1 tablet (250 mg total) by mouth 2 (two) times daily.  . clonazePAM (KLONOPIN) 0.5 MG tablet TAKE 1-2 TABLETS BY MOUTH ONCE A DAY AS NEEDED  . Cranberry (ELLURA) 200 MG CAPS Take 1 capsule by mouth daily.  . CRESTOR 20 MG tablet TAKE ONE-HALF TABLET BY MOUTH ONCE EVERY EVENING  . cyanocobalamin (,VITAMIN B-12,) 1000 MCG/ML injection Inject 1,000 mcg into the muscle every 30 (thirty) days.    Marland Kitchen dexlansoprazole (DEXILANT) 60 MG capsule Take 1 capsule (60 mg total) by mouth daily.  . fluocinolone  (VANOS) 0.01 % cream Apply 1 application topically 2 (two) times daily as needed (itching).   . furosemide (LASIX) 20 MG tablet Take 1 tablet (20 mg total) by mouth daily as needed for fluid or edema.  . hydrOXYzine (ATARAX/VISTARIL) 10 MG tablet Take 10 mg by mouth 3 (three) times daily as needed for itching.  . hyoscyamine (LEVSIN SL) 0.125 MG SL tablet PLACE 2 TABLETS UNDER THE TONGUE EVERY 4 (FOUR) HOURS AS NEEDED.  Marland Kitchen ibandronate (BONIVA) 3 MG/3ML SOLN injection Inject 3 mg into the vein once. Infuse every 3 months  . isosorbide mononitrate (IMDUR) 30 MG 24 hr tablet TAKE 1/2 TABLET BY MOUTH DAILY  . levothyroxine (SYNTHROID, LEVOTHROID) 112 MCG tablet Take 112 mcg by mouth daily before breakfast.  . loratadine (CLARITIN) 10 MG tablet Take 10 mg by mouth 2 (two) times daily as needed.   . mometasone (NASONEX) 50 MCG/ACT nasal spray Place 2 sprays into the nose daily as needed (allergies).   . nitroGLYCERIN (NITROSTAT) 0.4 MG SL tablet Place 0.4 mg under the tongue every 5 (five) minutes as needed.    . phenazopyridine (PYRIDIUM) 100 MG tablet Take 1 tablet (100 mg total) by mouth 2 (two) times daily as needed for pain.  . polyethylene glycol powder (GLYCOLAX/MIRALAX) powder Take 17 g by mouth daily as needed for moderate constipation.  . propranolol (INDERAL) 10 MG tablet TAKE 1 TABLET (10 MG TOTAL) BY MOUTH 3 (THREE) TIMES DAILY AS NEEDED (TREMORS).  Marland Kitchen sertraline (ZOLOFT) 25 MG tablet TAKE 1 TABLET (25 MG TOTAL) BY MOUTH EVERY MORNING.   No facility-administered encounter medications on file as of 07/29/2015.  :  Review of Systems:  Out of a complete 14 point review of systems, all are reviewed and  negative with the exception of these symptoms as listed below:   Review of Systems  Constitutional: Positive for fatigue.  HENT: Positive for hearing loss.        Husband states that physical therapist noticed that the patient's eyes are "shaking" and he asked if Dr. Rexene Alberts can check them.    Gastrointestinal: Positive for abdominal pain and constipation.  Endocrine: Positive for polyuria.  Genitourinary:       2 UTIs in June and July   Neurological: Positive for tremors.       Walking difficulty.  Patient reports doing well with physical therapy    Objective:  Neurologic Exam  Physical Exam Physical Examination:   Filed Vitals:   07/29/15 1532  BP: 126/72  Pulse: 78  Resp: 16   General Examination: The patient is a very pleasant 74 y.o. female in no acute distress. [name] [name] appears well-developed and well-nourished and well groomed. [name] [name] is in good spirits today, [name] [name] usual self. [name] [name] is situated in [name] [name] wheelchair.  HEENT: Normocephalic, atraumatic, pupils are equal, round and reactive to light and accommodation. Extraocular tracking shows mild limitation to upper gaze. [name] [name] has saccadic breakdown of smooth pursuit but no nystagmus. Hearing is mildly impaired. [name] [name] has hearing aids in place. [name] [name] has mild facial masking and normal facial sensation. Speech shows mild to moderate hypophonia with mild dysarthria and mild voice tremor. Neck shows moderate rigidity.  There is a mild head, no-no type tremor. There are no carotid bruits on auscultation. Head is tilted to the L and [name] [name] tends to lean a little bit to the left in [name] [name] wheelchair, better from last time. Oropharynx exam reveals: mild to moderate mouth dryness, adequate dental hygiene and mild airway crowding, due to narrow airway. Mallampati is class II.   Chest: Clear to auscultation without wheezing, rhonchi or crackles noted.  Heart: S1+S2+0, regular and normal without murmurs, rubs or gallops noted.   Abdomen: Soft, non-tender and non-distended with normal bowel sounds appreciated on auscultation.  Extremities: There is 2+ pitting edema in the distal lower extremities bilaterally, worse from before, slightly better.   Skin: Warm and dry without trophic changes noted. [name] [name] has some smaller bruises on [name] [name] hands.    Musculoskeletal: exam reveals no obvious joint deformities, tenderness or joint swelling or erythema.   Neurologically:  Mental status: The patient is awake, alert and oriented in all 4 spheres. [name] [name] memory, attention, language and knowledge are fairly well preserved. There is no aphasia, agnosia, apraxia or anomia. Speech is mildly tremulous and slightly dysarthric with normal prosody and enunciation. Thought process is linear. Mood is congruent and affect is normal.  Cranial nerves are as described above under HEENT exam. In addition, shoulder shrug is normal with equal shoulder height noted. Motor exam: [name] [name] has thin bulk, 4/5 global strength and tone is increased throughout, moderately, R>L. There is no drift, mild intermittent resting tremor in the RUE, no rebound. Grip strength is a little bit worse on the right. [name] [name] has a mild action tremor bilaterally as well as a mild postural tremor, right worse than left. Reflexes are 2+ in the UEs and 3+ in the LEs, unchanged. Fine motor skills are moderately impaired in the UEs and severely impaired in the LEs, with no significant lateralization, perhaps a little worse on the R, unchanged from before.  Cerebellar testing shows no dysmetria or intention tremor on finger to nose testing. There is no truncal or gait ataxia.  Sensory exam is intact to light  touch in both upper and lower extremities.  Gait, station and balance: I did not try to stand or walk [name] [name] today, [name] [name] has been requiring maximum assistance.   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 [name] [name] has progressed with time, as expected. [name] [name] reports  Feeling fairly stable. [name] [name] has benefit from outpatient physical therapy and I reviewed the outpatient physical therapy notes. [name] [name] has been able to reach them short-term goals. [name] [name] has appointments usually twice a week and has pending appointments through the first week of October 2016.  [name] [name] lower extremity  Swelling has remained stable. We talked about [name] [name] brain MRI from June 2016 and stable findings from September 2013 in that regard. This is reassuring. I again talked to the patient and [name] [name] husband about [name] [name] atypical parkinsonism and the challenges of this. [name] [name] has progressed with time. [name] [name] is barely able to use [name] [name] walker even with assistance at this time. [name] [name] has fallen repeatedly in the past but not recently thankfully. [name] [name] is primarily confined to [name] [name] wheelchair and sleeps in [name] [name] lift chair. I  Suggested [name] [name] continue with outpatient physical therapy. [name] [name] cannot maneuver the walker by herself.  I will see [name] [name] back in about 6 months, sooner if needed. I answered all their questions today and [name] [name] and [name] [name] husband were in agreement.  [name] [name] received a B12 shot today. This was given to [name] [name] by Leighton Parody, RN before patient left today. I spent 25 minutes in total face-to-face time with the patient, more than 50% of which was spent in counseling and coordination of care, reviewing test results, reviewing medication and discussing or reviewing the diagnosis of MSA, its prognosis and treatment options.

## 2015-08-01 ENCOUNTER — Ambulatory Visit: Payer: Medicare Other | Admitting: Physical Therapy

## 2015-08-01 DIAGNOSIS — M6289 Other specified disorders of muscle: Secondary | ICD-10-CM

## 2015-08-01 DIAGNOSIS — R269 Unspecified abnormalities of gait and mobility: Secondary | ICD-10-CM | POA: Diagnosis not present

## 2015-08-01 DIAGNOSIS — R279 Unspecified lack of coordination: Secondary | ICD-10-CM

## 2015-08-03 ENCOUNTER — Encounter: Payer: Self-pay | Admitting: Physical Therapy

## 2015-08-03 NOTE — Therapy (Signed)
[facility] [address] [place], [place], [address] Phone: [phone]   Fax:  [phone]  Physical Therapy Treatment  Patient Details  Name: [name] [name] MRN: [id number] Date of Birth: [birth year] Referring Provider:  Ria Bush, MD  Encounter Date: 08/01/2015      PT End of Session - 08/03/15 0904    Visit Number 17   Number of Visits 22   Date for PT Re-Evaluation 08/17/15   Authorization Type [employer] [facility]   Authorization Time Period 07-18-15 - 09-16-15   PT Start Time 1532   PT Stop Time 1624   PT Time Calculation (min) 52 min   Equipment Utilized During Treatment Gait belt      Past Medical History  Diagnosis Date  . GERD (gastroesophageal reflux disease)     s/p nissen  . Familial tremor     followed by Dr. Erling Cruz  . Unspecified chronic bronchitis   . Neurogenic bladder     Tannenbaum/MacDiarmid  . History of pneumonia   . Hypothyroidism   . Depression   . Asthma   . CAD (coronary artery disease)     myoview 5/08:  EF 76%, no scar, no ischemia, +ECG changes with exercise;  cath 04/08/07:  pLAD 30%, mLAD 60-70% - med tx.  Marland Kitchen HLD (hyperlipidemia)   . Multiple system atrophy     [name]  . VAGINITIS, ATROPHIC 11/07/2007  . Osteoporosis 2016    DEXA T-4.4 spine deteriorated since 2012  . UTI (urinary tract infection)     freq with her diease    Past Surgical History  Procedure Laterality Date  . Laparoscopic nissen fundoplication    . Cystectomy    . Tumor removal    . Tubal ligation    . Colonoscopy N/A 08/29/2013    int hemm; Inda Castle, MD  . Dexa  12/2014    T -4.4 spine  . Esophagogastroduodenoscopy N/A 01/09/2015    Procedure: ESOPHAGOGASTRODUODENOSCOPY (EGD);  Surgeon: Inda Castle, MD;  Location: [facility];  Service: Endoscopy;  Laterality: N/A;  help with transfers    There were no vitals filed for this visit.  Visit Diagnosis:  Abnormality of  gait  Abnormal increased muscle tone  Lack of coordination      Subjective Assessment - 08/03/15 0859    Subjective "today has been a really rough day - had alot of trouble walking this morning"   Pertinent History Multiple Systems atrophy diagnosed in May 2014 with initial appt. at [facility] clinic in Aug. 2013;Pt. had UTI on 04-11-15; went to ED - received catherization; states she has had increased weakness and some mild decline in mobility since this problem   Patient Stated Goals improve walking and mobility   Currently in Pain? No/denies                         [facility] Adult PT Treatment/Exercise - 08/03/15 0001    Ambulation/Gait   Ambulation/Gait Yes   Ambulation/Gait Assistance 4: Min assist   Ambulation/Gait Assistance Details gait speed slower with decr. R step length   Ambulation Distance (Feet) 120 Feet  120' performed again after seated rest period   Assistive device Rolling walker   Gait Pattern Wide base of support;Decreased hip/knee flexion - right;Decreased hip/knee flexion - left;Decreased stride length;Step-through pattern;Abducted- right;Decreased trunk rotation   Ambulation Surface Level;Indoor   Lumbar Exercises: Stretches   Lower Trunk Rotation 1 rep;30  seconds   Lower Trunk Rotation Limitations L side tighter than R side   Knee/Hip Exercises: Aerobic   Stationary Bike Scifit level 1.5 x 10' (no charge) as unsupervised           [facility]) - 08/03/15 0902    PWR! exercises Moves in sitting   PWR! Up 10 reps   PWR! Twist 10 reps to right and left sides               PT Short Term Goals - 06/28/15 1622    PT SHORT TERM GOAL #1   Title Pt. will increase standing tolerance so she is able to report standing 5" at home with S for independence with ADL's. (06-19-15)   Status Achieved   PT SHORT TERM GOAL #2   Title Increase distance in 3" walk test to >/= 100' for increased amb. speed and endurance.  (06-19-15)   Baseline 06/20/15: 160  feet  in 3 minutes with min guard assit to min assist   Status Achieved   PT SHORT TERM GOAL #3   Title Transfer sit to stand from wheelchair with min assist   (06-19-15)   Status Achieved   PT SHORT TERM GOAL #4   Title Independent in updated HEP (06-19-15)   PT SHORT TERM GOAL #5   Title Improve TUG score to </= 50 secs with RW with CGA (06-19-15)   Baseline 06/20/15: 46.40 sec's with walker   Status Achieved           PT Long Term Goals - 07/21/15 1451    PT LONG TERM GOAL #1   Title Pt. will ambulate 240' nonstop with RW to demo incr. endurance with CGA  (07-19-15)  new date 08-17-15   Time 4   Period Weeks   Status On-going   PT LONG TERM GOAL #2   Title Report ability to stand at least 10" at home for improved standing tolerance  (07-19-15) new date 08-17-15   Time 4   Period Weeks   Status On-going   PT LONG TERM GOAL #3   Title Pt. will transfer sit to to stand 5 times with UE support prn from mat with CGA to demo improved initial standing balance (07-19-15) new date 08-17-15   Time 4   Period Weeks   Status On-going   PT LONG TERM GOAL #4   Title Perform at least 15" on Nustep to demo improved activity tolerance/endurance  (07-19-15)   Status Achieved   PT LONG TERM GOAL #5   Title Pt. will demo technique to assist with step initiation after freezing episode  (07-19-15)   Baseline inconsistently met   Status On-going   PT LONG TERM GOAL #6   Title Improve TUG score to </=48 secs. with RW with CGA.  (07-19-15) new date 08-17-15   Baseline 56.22 secs wtih RW   Time 4   Period Weeks   Status On-going   PT LONG TERM GOAL #7   Title Transfer sit to stand 5 times with CGA for incr safety with transfers   Status Achieved               Plan - 08/03/15 0904    Clinical Impression Statement Pt. had more difficulty with mobility today compared to performance in previous sessions- incr. freezing of RLE noted during gait training   Pt will benefit from skilled therapeutic  intervention in order to improve on the following deficits Abnormal gait;Decreased coordination;Impaired flexibility;Difficulty walking;Decreased  balance;Increased muscle spasms;Impaired tone;Decreased mobility;Decreased strength;Decreased activity tolerance;Decreased range of motion   Rehab Potential Fair   PT Frequency 2x / week   PT Duration 4 weeks   PT Treatment/Interventions ADLs/Self Care Home Management;Therapeutic activities;Patient/family education;Therapeutic exercise;Gait training;Balance training;Stair training;Neuromuscular re-education;Functional mobility training   PT Next Visit Plan cont gait and balance training   Consulted and Agree with Plan of Care Patient        Problem List Patient Active Problem List   Diagnosis Date Noted  . Neurogenic bladder   . Acute recurrent cystitis 04/18/2015  . Pedal edema 04/01/2015  . Dyspepsia 01/09/2015  . Constipation 12/14/2014  . Medicare annual wellness visit, subsequent 11/27/2014  . Advanced care planning/counseling discussion 11/27/2014  . Osteoporosis   . B-complex deficiency 06/07/2014  . Recurrent falls 02/06/2014  . Multiple system atrophy   . Dysphagia, unspecified(787.20) 07/28/2013  . Lower extremity edema 09/26/2012  . Shoulder pain, left 07/20/2012  . 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 Routine health maintenance 08/26/2011  . Murmur 05/27/2011  . HYPERCHOLESTEROLEMIA  IIA 05/14/2010  . VENOUS INSUFFICIENCY, LEGS 06/21/2009  . CORONARY HEART DISEASE 06/09/2008  . Hypothyroidism 05/15/2008  . ASTHMA 05/15/2008  . Depression 11/07/2007  . 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 GERD 11/07/2007  . VAGINITIS, ATROPHIC 11/07/2007    Alda Lea, PT 08/03/2015, 9:07 AM  [facility] [address] [place], [place], [address] Phone: [phone]   Fax:  [phone]

## 2015-08-06 ENCOUNTER — Encounter: Payer: Self-pay | Admitting: Physical Therapy

## 2015-08-06 ENCOUNTER — Ambulatory Visit: Payer: Medicare Other | Admitting: Physical Therapy

## 2015-08-06 DIAGNOSIS — M6289 Other specified disorders of muscle: Secondary | ICD-10-CM

## 2015-08-06 DIAGNOSIS — R269 Unspecified abnormalities of gait and mobility: Secondary | ICD-10-CM

## 2015-08-06 DIAGNOSIS — R279 Unspecified lack of coordination: Secondary | ICD-10-CM

## 2015-08-06 NOTE — Therapy (Signed)
[facility] [address] [place], [place], [address] Phone: [phone]   Fax:  [phone]  Physical Therapy Treatment  Patient Details  Name: [name] [name] MRN: [id number] Date of Birth: 1941-05-30 Referring Quenton Recendez:  Ria Bush, MD  Encounter Date: 08/06/2015      PT End of Session - 08/06/15 2138    Visit Number 18  G8   Number of Visits 22   Date for PT Re-Evaluation 08/17/15   Authorization Type [employer] [facility]   Authorization Time Period 07-18-15 - 09-16-15   PT Start Time 1446   PT Stop Time 1535   PT Time Calculation (min) 49 min   Equipment Utilized During Treatment Gait belt      Past Medical History  Diagnosis Date  . GERD (gastroesophageal reflux disease)     s/p nissen  . Familial tremor     followed by Dr. Erling Cruz  . Unspecified chronic bronchitis   . Neurogenic bladder     Tannenbaum/MacDiarmid  . History of pneumonia   . Hypothyroidism   . Depression   . Asthma   . CAD (coronary artery disease)     myoview 5/08:  EF 76%, no scar, no ischemia, +ECG changes with exercise;  cath 04/08/07:  pLAD 30%, mLAD 60-70% - med tx.  Marland Kitchen HLD (hyperlipidemia)   . Multiple system atrophy     [name]  . VAGINITIS, ATROPHIC 11/07/2007  . Osteoporosis 2016    DEXA T-4.4 spine deteriorated since 2012  . UTI (urinary tract infection)     freq with her diease    Past Surgical History  Procedure Laterality Date  . Laparoscopic nissen fundoplication    . Cystectomy    . Tumor removal    . Tubal ligation    . Colonoscopy N/A 08/29/2013    int hemm; Inda Castle, MD  . Dexa  12/2014    T -4.4 spine  . Esophagogastroduodenoscopy N/A 01/09/2015    Procedure: ESOPHAGOGASTRODUODENOSCOPY (EGD);  Surgeon: Inda Castle, MD;  Location: [facility];  Service: Endoscopy;  Laterality: N/A;  help with transfers    There were no vitals filed for this visit.  Visit Diagnosis:  Abnormality of  gait  Lack of coordination  Abnormal increased muscle tone      Subjective Assessment - 08/06/15 2129    Subjective Pt states she is doing alot better today than she was on Thursday - had stomach problems Thursday night (upset stomach with diarrhea) and she attributes the beginning of that problem to her mobility difficulties that occurred  last Thursday   Pertinent History Multiple Systems atrophy diagnosed in May 2014 with initial appt. at [facility] clinic in Aug. 2013;Pt. had UTI on 04-11-15; went to ED - received catherization; states she has had increased weakness and some mild decline in mobility since this problem   Patient Stated Goals improve walking and mobility   Currently in Pain? No/denies                         [facility] Adult PT Treatment/Exercise - 08/06/15 1510    Bed Mobility   Bed Mobility Supine to Sit;Sit to Supine   Supine to Sit 4: Min assist   Sit to Supine 4: Min assist   Sit to Supine - Details (indicate cue type and reason) needs assist with moving RLE onto/over on mat   Scooting to [facility] 3: Mod assist   Transfers  Sit to Stand 4: Min assist  with mod cues for correct positioning   Sit to Stand Details Verbal cues for technique;Verbal cues for precautions/safety;Manual facilitation for weight shifting  needs mod assist to shift weight anteriorly   Sit to Stand Details (indicate cue type and reason) 5 times from mat to RW   Stand to Sit 3: Mod assist;4: Min guard  cues for correct hand placement   Stand to Sit Details (indicate cue type and reason) Verbal cues for technique;Verbal cues for precautions/safety   Ambulation/Gait   Ambulation/Gait Yes   Ambulation/Gait Assistance 4: Min assist   Ambulation/Gait Assistance Details cont to need cues to stay inside RW   Ambulation Distance (Feet) 120 Feet  2 reps with seated rest period after 1 120' lap    Assistive device Rolling walker   Gait Pattern Wide base of support;Decreased hip/knee flexion -  right;Decreased hip/knee flexion - left;Decreased stride length;Step-through pattern;Abducted- right;Decreased trunk rotation   Ambulation Surface Level;Indoor   Lumbar Exercises: Stretches   Passive Hamstring Stretch 1 rep;20 seconds   Lower Trunk Rotation 30 seconds   Lower Trunk Rotation Limitations L side tighter than R side   Hip Flexor Stretch 1 rep;20 seconds  in supine position - leg off side of mat   Piriformis Stretch 2 reps;30 seconds  bil. LE's   Lumbar Exercises: Aerobic   Stationary Bike Scifit level 1.5 10" (no charge as unsupervised)   Lumbar Exercises: Supine   Heel Slides 10 reps  with resistance   Bridge 10 reps     TherAct: practiced rolling supine to R and L sides 3 reps with min to CGA for positioning - cues to flex LE to assist With rolling hip over             PT Short Term Goals - 06/28/15 1622    PT SHORT TERM GOAL #1   Title Pt. will increase standing tolerance so she is able to report standing 5" at home with S for independence with ADL's. (06-19-15)   Status Achieved   PT SHORT TERM GOAL #2   Title Increase distance in 3" walk test to >/= 100' for increased amb. speed and endurance.  (06-19-15)   Baseline 06/20/15: 160 feet  in 3 minutes with min guard assit to min assist   Status Achieved   PT SHORT TERM GOAL #3   Title Transfer sit to stand from wheelchair with min assist   (06-19-15)   Status Achieved   PT SHORT TERM GOAL #4   Title Independent in updated HEP (06-19-15)   PT SHORT TERM GOAL #5   Title Improve TUG score to </= 50 secs with RW with CGA (06-19-15)   Baseline 06/20/15: 46.40 sec's with walker   Status Achieved           PT Long Term Goals - 07/21/15 1451    PT LONG TERM GOAL #1   Title Pt. will ambulate 240' nonstop with RW to demo incr. endurance with CGA  (07-19-15)  new date 08-17-15   Time 4   Period Weeks   Status On-going   PT LONG TERM GOAL #2   Title Report ability to stand at least 10" at home for improved  standing tolerance  (07-19-15) new date 08-17-15   Time 4   Period Weeks   Status On-going   PT LONG TERM GOAL #3   Title Pt. will transfer sit to to stand 5 times with UE support prn from  mat with CGA to demo improved initial standing balance (07-19-15) new date 08-17-15   Time 4   Period Weeks   Status On-going   PT LONG TERM GOAL #4   Title Perform at least 15" on Nustep to demo improved activity tolerance/endurance  (07-19-15)   Status Achieved   PT LONG TERM GOAL #5   Title Pt. will demo technique to assist with step initiation after freezing episode  (07-19-15)   Baseline inconsistently met   Status On-going   PT LONG TERM GOAL #6   Title Improve TUG score to </=48 secs. with RW with CGA.  (07-19-15) new date 08-17-15   Baseline 56.22 secs wtih RW   Time 4   Period Weeks   Status On-going   PT LONG TERM GOAL #7   Title Transfer sit to stand 5 times with CGA for incr safety with transfers   Status Achieved               Plan - 08/06/15 2139    Clinical Impression Statement Pt.'s gait improved today compared to performance last Thursday; pt cont to need cues for correct hand placement and positioning with transfers   Pt will benefit from skilled therapeutic intervention in order to improve on the following deficits Abnormal gait;Decreased coordination;Impaired flexibility;Difficulty walking;Decreased balance;Increased muscle spasms;Impaired tone;Decreased mobility;Decreased strength;Decreased activity tolerance;Decreased range of motion   Rehab Potential Fair   PT Frequency 2x / week   PT Duration 4 weeks   PT Treatment/Interventions ADLs/Self Care Home Management;Therapeutic activities;Patient/family education;Therapeutic exercise;Gait training;Balance training;Stair training;Neuromuscular re-education;Functional mobility training   PT Next Visit Plan cont gait training; stretches, seated PWR! Big exercises; plan D/C next week   PT Home Exercise Plan stretches and supine  strengthening exs.   Consulted and Agree with Plan of Care Patient        Problem List Patient Active Problem List   Diagnosis Date Noted  . Neurogenic bladder   . Acute recurrent cystitis 04/18/2015  . Pedal edema 04/01/2015  . Dyspepsia 01/09/2015  . Constipation 12/14/2014  . Medicare annual wellness visit, subsequent 11/27/2014  . Advanced care planning/counseling discussion 11/27/2014  . Osteoporosis   . B-complex deficiency 06/07/2014  . Recurrent falls 02/06/2014  . Multiple system atrophy   . Dysphagia, unspecified(787.20) 07/28/2013  . Lower extremity edema 09/26/2012  . Shoulder pain, left 07/20/2012  . 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 Routine health maintenance 08/26/2011  . Murmur 05/27/2011  . HYPERCHOLESTEROLEMIA  IIA 05/14/2010  . VENOUS INSUFFICIENCY, LEGS 06/21/2009  . CORONARY HEART DISEASE 06/09/2008  . Hypothyroidism 05/15/2008  . ASTHMA 05/15/2008  . Depression 11/07/2007  . 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 GERD 11/07/2007  . VAGINITIS, ATROPHIC 11/07/2007    Alda Lea, PT  08/06/2015, 9:44 PM  [facility] [address] [place], [place], [address] Phone: [phone]   Fax:  [phone]

## 2015-08-07 ENCOUNTER — Ambulatory Visit: Payer: Medicare Other | Admitting: Physical Therapy

## 2015-08-07 ENCOUNTER — Ambulatory Visit (INDEPENDENT_AMBULATORY_CARE_PROVIDER_SITE_OTHER): Payer: Medicare Other | Admitting: Cardiovascular Disease

## 2015-08-07 ENCOUNTER — Encounter: Payer: Self-pay | Admitting: Cardiovascular Disease

## 2015-08-07 DIAGNOSIS — R269 Unspecified abnormalities of gait and mobility: Secondary | ICD-10-CM

## 2015-08-07 DIAGNOSIS — M6289 Other specified disorders of muscle: Secondary | ICD-10-CM

## 2015-08-07 DIAGNOSIS — I25810 Atherosclerosis of coronary artery bypass graft(s) without angina pectoris: Secondary | ICD-10-CM | POA: Diagnosis not present

## 2015-08-07 DIAGNOSIS — I2510 Atherosclerotic heart disease of native coronary artery without angina pectoris: Secondary | ICD-10-CM | POA: Diagnosis not present

## 2015-08-07 MED ORDER — NITROGLYCERIN 0.4 MG SL SUBL: 0.4000 mg | SUBLINGUAL_TABLET | SUBLINGUAL | Status: AC | PRN

## 2015-08-07 NOTE — Patient Instructions (Signed)
Medication Instructions:  None  Labwork: None  Testing/Procedures: None  Follow-Up: Your physician wants you to follow-up in: 6 months with Dr. Burt Knack. You will receive a reminder letter in the mail two months in advance. If you don't receive a letter, please call our office to schedule the follow-up appointment.   Any Other Special Instructions Will Be Listed Below (If Applicable).

## 2015-08-07 NOTE — Progress Notes (Signed)
Cardiology Office Note Date:  08/07/2015   ID:  [name] [name], DOB Apr 30, 1941, MRN [id number]  PCP:  Ria Bush, MD  Cardiologist:  [name], MD    Chief Complaint  Patient presents with  . Fatigue    History of Present Illness: [name] [name] is a 74 y.o. female who presents for followup evaluation. She is followed for coronary artery disease. 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 She is wheelchair bound.   From a cardiac perspective she is doing okay. She denies chest pain or pressure. She does admit to shortness of breath but without significant change. Her primary issues are related to neurologic disease and generalized weakness. She complains of leg swelling without orthopnea or PND.   Past Medical History  Diagnosis Date  . GERD (gastroesophageal reflux disease)     s/p nissen  . Familial tremor     followed by Dr. Erling Cruz  . Unspecified chronic bronchitis   . Neurogenic bladder     Tannenbaum/MacDiarmid  . History of pneumonia   . Hypothyroidism   . Depression   . Asthma   . CAD (coronary artery disease)     myoview 5/08:  EF 76%, no scar, no ischemia, +ECG changes with exercise;  cath 04/08/07:  pLAD 30%, mLAD 60-70% - med tx.  Marland Kitchen HLD (hyperlipidemia)   . Multiple system atrophy     [name]  . VAGINITIS, ATROPHIC 11/07/2007  . Osteoporosis 2016    DEXA T-4.4 spine deteriorated since 2012  . UTI (urinary tract infection)     freq with her diease    Past Surgical History  Procedure Laterality Date  . Laparoscopic nissen fundoplication    . Cystectomy    . Tumor removal    . Tubal ligation    . Colonoscopy N/A 08/29/2013    int hemm; Inda Castle, MD  . Dexa  12/2014    T -4.4 spine  . Esophagogastroduodenoscopy N/A 01/09/2015    Procedure: ESOPHAGOGASTRODUODENOSCOPY (EGD);  Surgeon: Inda Castle, MD;  Location: [facility];  Service: Endoscopy;  Laterality:  N/A;  help with transfers    Current Outpatient Prescriptions  Medication Sig Dispense Refill  . AFLURIA PRESERVATIVE FREE injection     . albuterol (PROAIR HFA) 108 (90 BASE) MCG/ACT inhaler Inhale 2 puffs into the lungs every 6 (six) hours as needed for wheezing or shortness of breath.     Marland Kitchen alum & mag hydroxide-simeth (MYLANTA) 212-248-25 MG/5ML suspension Take 15 mLs by mouth every 6 (six) hours as needed for indigestion or heartburn.    Marland Kitchen aspirin (ASPIRIN EC) 81 MG EC tablet Take 81 mg by mouth 2 (two) times a week. Swallow whole. Take at bedtime    . beclomethasone (QVAR) 80 MCG/ACT inhaler Inhale 1 puff into the lungs daily as needed (wheezing).     . Calcium Carbonate-Vitamin D (CALCIUM-VITAMIN D) 500-200 MG-UNIT per tablet Take 1 tablet by mouth daily.      . ciprofloxacin (CIPRO) 250 MG tablet Take 1 tablet (250 mg total) by mouth 2 (two) times daily. 14 tablet 0  . clonazePAM (KLONOPIN) 0.5 MG tablet Take by mouth 2 (two) times daily as needed for anxiety. Take 1-2 tablets by mouth once daily as needed for tremors    . Cranberry (ELLURA) 200 MG CAPS Take 1 capsule by mouth daily.    . CRESTOR 20 MG tablet TAKE ONE-HALF TABLET BY MOUTH ONCE  EVERY EVENING 45 tablet 2  . cyanocobalamin (,VITAMIN B-12,) 1000 MCG/ML injection Inject 1,000 mcg into the muscle every 30 (thirty) days.      Marland Kitchen dexlansoprazole (DEXILANT) 60 MG capsule Take 1 capsule (60 mg total) by mouth daily. 30 capsule 3  . fluocinolone (VANOS) 0.01 % cream Apply 1 application topically 2 (two) times daily as needed (itching).     . furosemide (LASIX) 20 MG tablet Take 1 tablet (20 mg total) by mouth daily as needed for fluid or edema. 30 tablet 0  . hydrOXYzine (ATARAX/VISTARIL) 10 MG tablet Take 10 mg by mouth 3 (three) times daily as needed for itching.    . hyoscyamine (LEVSIN SL) 0.125 MG SL tablet Place 0.125 mg under the tongue every 4 (four) hours as needed (spasms).    . ibandronate (BONIVA) 3 MG/3ML SOLN injection  Inject 3 mg into the vein once. Infuse every 3 months    . isosorbide mononitrate (IMDUR) 30 MG 24 hr tablet TAKE 1/2 TABLET BY MOUTH DAILY 30 tablet 5  . levothyroxine (SYNTHROID, LEVOTHROID) 112 MCG tablet Take 112 mcg by mouth daily before breakfast.    . loratadine (CLARITIN) 10 MG tablet Take 10 mg by mouth 2 (two) times daily as needed (allergies).     . mometasone (NASONEX) 50 MCG/ACT nasal spray Place 2 sprays into the nose daily as needed (allergies).     . nitroGLYCERIN (NITROSTAT) 0.4 MG SL tablet Place 1 tablet (0.4 mg total) under the tongue every 5 (five) minutes as needed (chest pain). 25 tablet 3  . phenazopyridine (PYRIDIUM) 100 MG tablet Take 1 tablet (100 mg total) by mouth 2 (two) times daily as needed for pain. 10 tablet 0  . polyethylene glycol powder (GLYCOLAX/MIRALAX) powder Take 17 g by mouth daily as needed for moderate constipation. 250 g 1  . propranolol (INDERAL) 10 MG tablet TAKE 1 TABLET (10 MG TOTAL) BY MOUTH 3 (THREE) TIMES DAILY AS NEEDED (TREMORS). 180 tablet 1  . sertraline (ZOLOFT) 25 MG tablet TAKE 1 TABLET (25 MG TOTAL) BY MOUTH EVERY MORNING. 30 tablet 11  . triamcinolone cream (KENALOG) 0.1 % Apply 1 application topically daily.     No current facility-administered medications for this visit.    Allergies:   Keflex   Social History:  The patient  reports that she has never smoked. She has never used smokeless tobacco. She reports that she does not drink alcohol or use illicit drugs.   Family History:  The patient's  family history includes Breast cancer in her maternal aunt; Colon cancer in her maternal aunt; Coronary artery disease in her daughter and father; Diabetes in her daughter; Heart attack in her daughter and father; Heart disease in her father; Hypertension in her mother; Multiple sclerosis in her daughter; Stroke in her daughter.    ROS:  Please see the history of present illness.  Otherwise, review of systems is positive for leg swelling,  hearing loss, vision change, abdominal pain, fatigue, constipation.  All other systems are reviewed and negative.    PHYSICAL EXAM: VS:  BP 110/58 mmHg  Pulse 84  Ht 5' 2.5" (1.588 m)  Wt  , BMI There is no weight on file to calculate BMI. GEN: pleasant woman, in wheelchair, in no acute distress HEENT: normal Neck: no JVD, no masses. No carotid bruits Cardiac: RRR without murmur or gallop                Respiratory:  clear to auscultation bilaterally,  normal work of breathing GI: soft, nontender, nondistended, + BS MS: no deformity or atrophy Ext: 1+ pretibial edema bilaterally Skin: warm and dry, no rash Neuro:  Strength and sensation are intact Psych: euthymic mood, full affect  EKG:  EKG is ordered today. The ekg ordered today shows normal sinus rhythm 84 bpm, minimal voltage criteria for LVH maybe normal variant.  Recent Labs: 11/20/2014: ALT 18 04/01/2015: TSH 0.88 04/11/2015: BUN 9; Creatinine, Ser 0.76; Hemoglobin 13.3; Platelets 267; Potassium 3.9; Sodium 137   Lipid Panel     Component Value Date/Time   CHOL 130 11/20/2014 1301   TRIG 115.0 11/20/2014 1301   HDL 43.90 11/20/2014 1301   CHOLHDL 3 11/20/2014 1301   VLDL 23.0 11/20/2014 1301   LDLCALC 63 11/20/2014 1301   LDLDIRECT 161.5 06/09/2010 1517      Wt Readings from Last 3 Encounters:  04/03/15 145 lb (65.772 kg)  01/11/15 150 lb (68.04 kg)  01/09/15 151 lb (68.493 kg)    ASSESSMENT AND PLAN: 1.  CAD, native vessel, without angina: Stable without symptoms of angina. She continues on aspirin, isosorbide, and propranolol. I will see her back in 6 months.  2. Hyperlipidemia: Treated with Crestor. Last lipids from January reviewed and she is at goal.   Current medicines are reviewed with the patient today.  The patient does not have concerns regarding medicines.  Labs/ tests ordered today include:   Orders Placed This Encounter  Procedures  . EKG 12-Lead    Disposition:   FU 6  months  Signed, [name], MD  08/07/2015 5:31 PM    [facility] [place], [place], [place]  [address] Phone: [phone]; Fax: [phone]

## 2015-08-07 NOTE — Therapy (Signed)
[facility] [address] [place], [place], [address] Phone: [phone]   Fax:  [phone]  Physical Therapy Treatment  Patient Details  Name: [name] [name] MRN: [id number] Date of Birth: July 10, 1941 Referring Tayanna Talford:  Ria Bush, MD  Encounter Date: 08/07/2015      PT End of Session - 08/07/15 1502    Visit Number 19   Number of Visits 22   Date for PT Re-Evaluation 08/17/15   Authorization Type [employer] MCR   Authorization Time Period 07-18-15 - 09-16-15   PT Start Time 1404   PT Stop Time 1446   PT Time Calculation (min) 42 min   Equipment Utilized During Treatment Gait belt   Activity Tolerance Patient tolerated treatment well   Behavior During Therapy [facility] for tasks assessed/performed      Past Medical History  Diagnosis Date  . GERD (gastroesophageal reflux disease)     s/p nissen  . Familial tremor     followed by Dr. Erling Cruz  . Unspecified chronic bronchitis   . Neurogenic bladder     Tannenbaum/MacDiarmid  . History of pneumonia   . Hypothyroidism   . Depression   . Asthma   . CAD (coronary artery disease)     myoview 5/08:  EF 76%, no scar, no ischemia, +ECG changes with exercise;  cath 04/08/07:  pLAD 30%, mLAD 60-70% - med tx.  Marland Kitchen HLD (hyperlipidemia)   . Multiple system atrophy     [name]  . VAGINITIS, ATROPHIC 11/07/2007  . Osteoporosis 2016    DEXA T-4.4 spine deteriorated since 2012  . UTI (urinary tract infection)     freq with her diease    Past Surgical History  Procedure Laterality Date  . Laparoscopic nissen fundoplication    . Cystectomy    . Tumor removal    . Tubal ligation    . Colonoscopy N/A 08/29/2013    int hemm; Inda Castle, MD  . Dexa  12/2014    T -4.4 spine  . Esophagogastroduodenoscopy N/A 01/09/2015    Procedure: ESOPHAGOGASTRODUODENOSCOPY (EGD);  Surgeon: Inda Castle, MD;  Location: [facility];  Service: Endoscopy;  Laterality: N/A;   help with transfers    There were no vitals filed for this visit.  Visit Diagnosis:  Abnormality of gait  Abnormal increased muscle tone      Subjective Assessment - 08/07/15 1451    Subjective Pt denies falls or changes since last visit.   Pertinent History Multiple Systems atrophy diagnosed in May 2014 with initial appt. at [facility] clinic in Aug. 2013;Pt. had UTI on 04-11-15; went to ED - received catherization; states she has had increased weakness and some mild decline in mobility since this problem   Patient Stated Goals improve walking and mobility   Currently in Pain? No/denies                         [facility] Adult PT Treatment/Exercise - 08/07/15 0001    Bed Mobility   Bed Mobility Supine to Sit;Sit to Supine   Supine to Sit 4: Min assist   Sit to Supine 4: Min assist   Sit to Supine - Details (indicate cue type and reason) needs assist with RLE   Scooting to [facility] Details (indicate cue type and reason) assist to maintain R foot in contact with mat   Transfers   Transfers Sit to Stand;Stand to Sit   Sit to Stand  4: Min assist   Sit to Stand Details Verbal cues for technique;Verbal cues for precautions/safety;Manual facilitation for weight shifting   Stand to Sit 4: Min assist   Sit to Stand Details (indicate cue type and reason) difficulty with placing and maintaining RLE position on floor   Ambulation/Gait   Ambulation/Gait Yes   Ambulation/Gait Assistance 4: Min assist   Ambulation Distance (Feet) 180 Feet   Assistive device Rolling walker   Gait Pattern Wide base of support;Decreased hip/knee flexion - right;Decreased hip/knee flexion - left;Decreased stride length;Step-through pattern;Abducted- right;Decreased trunk rotation   Ambulation Surface Level;Indoor   Lumbar Exercises: Stretches   Passive Hamstring Stretch 2 reps;60 seconds   Hip Flexor Stretch 1 rep;30 seconds   Lumbar Exercises: Supine   Ab Set 10 reps;4 seconds   Clam 5 reps   Bridge 10 reps    Knee/Hip Exercises: Aerobic   Stationary Bike Scifit level 1.5 all 4 extremities x 15 minutes-no charge as unsupervised   Knee/Hip Exercises: Supine   Other Supine Knee/Hip Exercises supine bil hip adductor stretch x 30 seconds                  PT Short Term Goals - 06/28/15 1622    PT SHORT TERM GOAL #1   Title Pt. will increase standing tolerance so she is able to report standing 5" at home with S for independence with ADL's. (06-19-15)   Status Achieved   PT SHORT TERM GOAL #2   Title Increase distance in 3" walk test to >/= 100' for increased amb. speed and endurance.  (06-19-15)   Baseline 06/20/15: 160 feet  in 3 minutes with min guard assit to min assist   Status Achieved   PT SHORT TERM GOAL #3   Title Transfer sit to stand from wheelchair with min assist   (06-19-15)   Status Achieved   PT SHORT TERM GOAL #4   Title Independent in updated HEP (06-19-15)   PT SHORT TERM GOAL #5   Title Improve TUG score to </= 50 secs with RW with CGA (06-19-15)   Baseline 06/20/15: 46.40 sec's with walker   Status Achieved           PT Long Term Goals - 07/21/15 1451    PT LONG TERM GOAL #1   Title Pt. will ambulate 240' nonstop with RW to demo incr. endurance with CGA  (07-19-15)  new date 08-17-15   Time 4   Period Weeks   Status On-going   PT LONG TERM GOAL #2   Title Report ability to stand at least 10" at home for improved standing tolerance  (07-19-15) new date 08-17-15   Time 4   Period Weeks   Status On-going   PT LONG TERM GOAL #3   Title Pt. will transfer sit to to stand 5 times with UE support prn from mat with CGA to demo improved initial standing balance (07-19-15) new date 08-17-15   Time 4   Period Weeks   Status On-going   PT LONG TERM GOAL #4   Title Perform at least 15" on Nustep to demo improved activity tolerance/endurance  (07-19-15)   Status Achieved   PT LONG TERM GOAL #5   Title Pt. will demo technique to assist with step initiation after freezing episode   (07-19-15)   Baseline inconsistently met   Status On-going   PT LONG TERM GOAL #6   Title Improve TUG score to </=48 secs. with RW with CGA.  (  07-19-15) new date 08-17-15   Baseline 56.22 secs wtih RW   Time 4   Period Weeks   Status On-going   PT LONG TERM GOAL #7   Title Transfer sit to stand 5 times with CGA for incr safety with transfers   Status Achieved               Plan - 08/07/15 1502    Clinical Impression Statement Pt continues with decreased coordination in RLE and involuntary movements at times.  Continues to be extremely motivated to maintain or improve mobility leve despite progressive nature of MSA.  Continue PT per POC with d/c next week per Guido Sander, PT.   Pt will benefit from skilled therapeutic intervention in order to improve on the following deficits Abnormal gait;Decreased coordination;Impaired flexibility;Difficulty walking;Decreased balance;Increased muscle spasms;Impaired tone;Decreased mobility;Decreased strength;Decreased activity tolerance;Decreased range of motion   Rehab Potential Fair   PT Frequency 2x / week   PT Duration 4 weeks   PT Treatment/Interventions ADLs/Self Care Home Management;Therapeutic activities;Patient/family education;Therapeutic exercise;Gait training;Balance training;Stair training;Neuromuscular re-education;Functional mobility training   PT Next Visit Plan Begin checking goals   PT Home Exercise Plan stretches and supine strengthening exs.   Consulted and Agree with Plan of Care Patient        Problem List Patient Active Problem List   Diagnosis Date Noted  . Neurogenic bladder   . Acute recurrent cystitis 04/18/2015  . Pedal edema 04/01/2015  . Dyspepsia 01/09/2015  . Constipation 12/14/2014  . Medicare annual wellness visit, subsequent 11/27/2014  . Advanced care planning/counseling discussion 11/27/2014  . Osteoporosis   . B-complex deficiency 06/07/2014  . Recurrent falls 02/06/2014  . Multiple system atrophy    . Dysphagia, unspecified(787.20) 07/28/2013  . Lower extremity edema 09/26/2012  . Shoulder pain, left 07/20/2012  . 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 Routine health maintenance 08/26/2011  . Murmur 05/27/2011  . HYPERCHOLESTEROLEMIA  IIA 05/14/2010  . VENOUS INSUFFICIENCY, LEGS 06/21/2009  . CORONARY HEART DISEASE 06/09/2008  . Hypothyroidism 05/15/2008  . ASTHMA 05/15/2008  . Depression 11/07/2007  . 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 GERD 11/07/2007  . VAGINITIS, ATROPHIC 11/07/2007    [name] 08/07/2015, 3:05 PM  [facility] [address] [place], [place], [address] Phone: [phone]   Fax:  [place], [place] 08/07/2015 3:06 PM Phone: [phone] Fax: [phone]

## 2015-08-08 ENCOUNTER — Telehealth: Payer: Self-pay | Admitting: Cardiovascular Disease

## 2015-08-08 ENCOUNTER — Other Ambulatory Visit: Payer: Self-pay | Admitting: Gastroenterology

## 2015-08-08 NOTE — Addendum Note (Signed)
Addended by: Janan Halter F on: 08/08/2015 12:28 PM   Modules accepted: Orders

## 2015-08-08 NOTE — Telephone Encounter (Signed)
New message     Pt was seen yesterday.  She has a question about the diagnosis that was on her printout

## 2015-08-08 NOTE — Telephone Encounter (Signed)
Called patient and discussed her AVS. Patient wanted to make sure her diagnosis was CAD with native heart. Clarified diagnosis and patient verbalized understanding.

## 2015-08-10 ENCOUNTER — Other Ambulatory Visit: Payer: Self-pay | Admitting: Family Medicine

## 2015-08-12 ENCOUNTER — Ambulatory Visit: Payer: Medicare Other | Attending: Neurology | Admitting: Physical Therapy

## 2015-08-12 DIAGNOSIS — R278 Other lack of coordination: Secondary | ICD-10-CM | POA: Diagnosis present

## 2015-08-12 DIAGNOSIS — R269 Unspecified abnormalities of gait and mobility: Secondary | ICD-10-CM | POA: Insufficient documentation

## 2015-08-12 DIAGNOSIS — R29898 Other symptoms and signs involving the musculoskeletal system: Secondary | ICD-10-CM | POA: Diagnosis present

## 2015-08-12 DIAGNOSIS — M6289 Other specified disorders of muscle: Secondary | ICD-10-CM | POA: Diagnosis present

## 2015-08-12 DIAGNOSIS — R279 Unspecified lack of coordination: Secondary | ICD-10-CM | POA: Insufficient documentation

## 2015-08-14 ENCOUNTER — Encounter: Payer: Self-pay | Admitting: Physical Therapy

## 2015-08-14 NOTE — Therapy (Signed)
[facility] [address] [place], [place], [address] Phone: [phone]   Fax:  [phone]  Physical Therapy Treatment  Patient Details  Name: [name] [name] MRN: [id number] Date of Birth: February 25, 1941 Referring Provider:  Ria Bush, MD:  Dr. Star Age  Encounter Date: 08/12/2015      PT End of Session - 08/14/15 1055    Visit Number 20   Number of Visits 22   Date for PT Re-Evaluation 08/17/15   Authorization Type UHC MCR   Authorization Time Period 07-18-15 - 09-16-15   PT Start Time 1450   PT Stop Time 1545   PT Time Calculation (min) 55 min   Equipment Utilized During Treatment Gait belt      Past Medical History  Diagnosis Date  . GERD (gastroesophageal reflux disease)     s/p nissen  . Familial tremor     followed by Dr. Erling Cruz  . Unspecified chronic bronchitis ([place])   . Neurogenic bladder     Tannenbaum/MacDiarmid  . History of pneumonia   . Hypothyroidism   . Depression   . Asthma   . CAD (coronary artery disease)     myoview 5/08:  EF 76%, no scar, no ischemia, +ECG changes with exercise;  cath 04/08/07:  pLAD 30%, mLAD 60-70% - med tx.  Marland Kitchen HLD (hyperlipidemia)   . 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 VAGINITIS, ATROPHIC 11/07/2007  . Osteoporosis 2016    DEXA T-4.4 spine deteriorated since 2012  . UTI (urinary tract infection)     freq with her diease    Past Surgical History  Procedure Laterality Date  . Laparoscopic nissen fundoplication    . Cystectomy    . Tumor removal    . Tubal ligation    . Colonoscopy N/A 08/29/2013    int hemm; Inda Castle, MD  . Dexa  12/2014    T -4.4 spine  . Esophagogastroduodenoscopy N/A 01/09/2015    Procedure: ESOPHAGOGASTRODUODENOSCOPY (EGD);  Surgeon: Inda Castle, MD;  Location: [facility];  Service: Endoscopy;  Laterality: N/A;  help with transfers    There were no vitals filed for this visit.  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 at [facility] clinic in Aug. 2013;Pt. had UTI on 04-11-15; went to ED - received catherization; states she has had increased weakness and some mild decline in mobility since this problem   Patient Stated Goals improve walking and mobility   Currently in Pain? No/denies                         [facility] Adult PT Treatment/Exercise - 08/14/15 0001    Bed Mobility   Bed Mobility Supine to Sit;Sit to Supine   Supine to Sit --   Sit to Supine 3: Mod assist   Transfers   Transfers Sit to Stand;Stand to Sit   Sit to Stand 3: Mod assist   Sit to Stand Details Verbal cues for technique;Verbal cues for precautions/safety;Manual facilitation for weight shifting   Stand to Sit 3: Mod assist  needs cues to  reach back    Stand to Sit Details (indicate cue type and reason) Verbal cues for technique;Verbal cues for precautions/safety   Ambulation/Gait   Ambulation/Gait Yes   Ambulation/Gait Assistance 4: Min assist   Ambulation/Gait Assistance Details cues to stay close to RW   Ambulation Distance (Feet) 240 Feet   Assistive device Rolling walker   Gait Pattern Wide base of support;Decreased hip/knee flexion - right;Decreased hip/knee flexion - left;Decreased stride length;Step-through pattern;Abducted- right;Decreased trunk rotation   Ambulation Surface Level;Indoor   Neuro Re-ed    Neuro Re-ed Details  Pt performed seated PWR! reach exercise to floor and up overhead to fasciltate anterior weight shift and trunk extension x 10 reps; also performed seated lateral trun rotation with hands meeting together on either side 10 reps to R and L as able    Lumbar Exercises: Stretches   Passive Hamstring Stretch 2 reps;60 seconds   Single Knee to Chest Stretch 2 reps;10 seconds   Lower Trunk Rotation 30 seconds   Lower Trunk Rotation Limitations L side tighter than R side   Hip Flexor Stretch 1 rep;30 seconds    Bridging 2 sets 10 reps; R 1/2 bridging 10 reps; hip abduction/adduction in hooklying position x 10 reps  Seated alternate marching for improved coordination - 10 reps each leg            PT Short Term Goals - 06/28/15 1622    PT SHORT TERM GOAL #1   Title Pt. will increase standing tolerance so she is able to report standing 5" at home with S for independence with ADL's. (06-19-15)   Status Achieved   PT SHORT TERM GOAL #2   Title Increase distance in 3" walk test to >/= 100' for increased amb. speed and endurance.  (06-19-15)   Baseline 06/20/15: 160 feet  in 3 minutes with min guard assit to min assist   Status Achieved   PT SHORT TERM GOAL #3   Title Transfer sit to stand from wheelchair with min assist   (06-19-15)   Status Achieved   PT SHORT TERM GOAL #4   Title Independent in updated HEP (06-19-15)   PT SHORT TERM GOAL #5   Title Improve TUG score to </= 50 secs with RW with CGA (06-19-15)   Baseline 06/20/15: 46.40 sec's with walker   Status Achieved           PT Long Term Goals - 07/21/15 1451    PT LONG TERM GOAL #1   Title Pt. will ambulate 240' nonstop with RW to demo incr. endurance with CGA  (07-19-15)  new date 08-17-15   Time 4   Period Weeks   Status On-going   PT LONG TERM GOAL #2   Title Report ability to stand at least 10" at home for improved standing tolerance  (07-19-15) new date 08-17-15   Time 4   Period Weeks   Status On-going   PT LONG TERM GOAL #3   Title Pt. will transfer sit to to stand 5 times with UE support prn from mat with CGA to demo improved initial standing balance (07-19-15) new date 08-17-15   Time 4   Period Weeks   Status On-going   PT LONG TERM GOAL #4   Title Perform at  least 15" on Nustep to demo improved activity tolerance/endurance  (07-19-15)   Status Achieved   PT LONG TERM GOAL #5   Title Pt. will demo technique to assist with step initiation after freezing episode  (  07-19-15)   Baseline inconsistently met   Status On-going   PT LONG TERM GOAL #6   Title Improve TUG score to </=48 secs. with RW with CGA.  (07-19-15) new date 08-17-15   Baseline 56.22 secs wtih RW   Time 4   Period Weeks   Status On-going   PT LONG TERM GOAL #7   Title Transfer sit to stand 5 times with CGA for incr safety with transfers   Status Achieved               Plan - 08/14/15 1056    Clinical Impression Statement Pt. appears to be plateauing in maximizing functional progress at this time - continues to have varying difficulty with dystonia in RLE with involuntary movement at times; L side of trunk appears to be tighter than R side - plan discharge at time of next scheduled PT session   Pt will benefit from skilled therapeutic intervention in order to improve on the following deficits Abnormal gait;Decreased coordination;Impaired flexibility;Difficulty walking;Decreased balance;Increased muscle spasms;Impaired tone;Decreased mobility;Decreased strength;Decreased activity tolerance;Decreased range of motion   Rehab Potential Fair   PT Frequency 2x / week   PT Duration 4 weeks   PT Treatment/Interventions ADLs/Self Care Home Management;Therapeutic activities;Patient/family education;Therapeutic exercise;Gait training;Balance training;Stair training;Neuromuscular re-education;Functional mobility training   PT Next Visit Plan D/C next session   PT Home Exercise Plan stretches and supine strengthening exs.   Consulted and Agree with Plan of Care Patient        Problem List Patient Active Problem List   Diagnosis Date Noted  . Neurogenic bladder   . Acute recurrent cystitis 04/18/2015  . Pedal edema 04/01/2015  . Dyspepsia 01/09/2015  . Constipation 12/14/2014  .  Medicare annual wellness visit, subsequent 11/27/2014  . Advanced care planning/counseling discussion 11/27/2014  . Osteoporosis   . B-complex deficiency 06/07/2014  . Recurrent falls 02/06/2014  . Multiple system atrophy ([place])   . Dysphagia, unspecified(787.20) 07/28/2013  . Lower extremity edema 09/26/2012  . Shoulder pain, left 07/20/2012  . 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 Routine health maintenance 08/26/2011  . Murmur 05/27/2011  . HYPERCHOLESTEROLEMIA  IIA 05/14/2010  . VENOUS INSUFFICIENCY, LEGS 06/21/2009  . CORONARY HEART DISEASE 06/09/2008  . Hypothyroidism 05/15/2008  . ASTHMA 05/15/2008  . Depression 11/07/2007  . 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 GERD 11/07/2007  . VAGINITIS, ATROPHIC 11/07/2007  Physical Therapy Progress Note  Dates of Reporting Period: 07-02-15 to  08-12-15  Objective Reports of Subjective Statement: Pt. Able to amb. 240' with RW with min to mod assist; requires mod assist with sit to stand transfers  Objective Measurements:  TUG score 46.30 secs with RW:  3" walk test - 160 with RW with min to mod assist with intermittent episodes of R foot freezing  Goal Update: see above for progress towards goals  Plan: Cont gait training, therex for stretching and strengthening and pt education  Reason Skilled Services are Required:  Gait abnormality with decr. Coordination, increased muscle tone and spasticity, and decreased standing balance (Plan D/C next session due to plateau in functional progress at this time)  Torris House, [place], PT 08/14/2015, 11:17 AM  [facility] [address] [place], [place], [address] Phone: [phone]   Fax:  [phone]

## 2015-08-15 ENCOUNTER — Ambulatory Visit: Payer: Medicare Other | Admitting: Physical Therapy

## 2015-08-15 DIAGNOSIS — R269 Unspecified abnormalities of gait and mobility: Secondary | ICD-10-CM | POA: Diagnosis not present

## 2015-08-15 DIAGNOSIS — M6289 Other specified disorders of muscle: Secondary | ICD-10-CM

## 2015-08-15 DIAGNOSIS — R279 Unspecified lack of coordination: Secondary | ICD-10-CM

## 2015-08-15 DIAGNOSIS — R29898 Other symptoms and signs involving the musculoskeletal system: Secondary | ICD-10-CM

## 2015-08-18 ENCOUNTER — Encounter: Payer: Self-pay | Admitting: Physical Therapy

## 2015-08-18 NOTE — Therapy (Signed)
[facility] [address] [place], [place], [address] Phone: [phone]   Fax:  [phone]  Physical Therapy Treatment  Patient Details  Name: [name] [name] MRN: [id number] Date of Birth: 12-04-1940 Referring Provider:  Ria Bush, MD  Encounter Date: 08/15/2015      PT End of Session - 08/18/15 1549    Visit Number 21   Number of Visits 22   Date for PT Re-Evaluation 08/17/15   Authorization Type UHC [facility]   Authorization Time Period 07-18-15 - 09-16-15   PT Start Time 1536   PT Stop Time 1624   PT Time Calculation (min) 48 min   Equipment Utilized During Treatment Gait belt      Past Medical History  Diagnosis Date  . GERD (gastroesophageal reflux disease)     s/p nissen  . Familial tremor     followed by Dr. Erling Cruz  . Unspecified chronic bronchitis ([place])   . Neurogenic bladder     Tannenbaum/MacDiarmid  . History of pneumonia   . Hypothyroidism   . Depression   . Asthma   . CAD (coronary artery disease)     myoview 5/08:  EF 76%, no scar, no ischemia, +ECG changes with exercise;  cath 04/08/07:  pLAD 30%, mLAD 60-70% - med tx.  Marland Kitchen HLD (hyperlipidemia)   . 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 VAGINITIS, ATROPHIC 11/07/2007  . Osteoporosis 2016    DEXA T-4.4 spine deteriorated since 2012  . UTI (urinary tract infection)     freq with her diease    Past Surgical History  Procedure Laterality Date  . Laparoscopic nissen fundoplication    . Cystectomy    . Tumor removal    . Tubal ligation    . Colonoscopy N/A 08/29/2013    int hemm; Inda Castle, MD  . Dexa  12/2014    T -4.4 spine  . Esophagogastroduodenoscopy N/A 01/09/2015    Procedure: ESOPHAGOGASTRODUODENOSCOPY (EGD);  Surgeon: Inda Castle, MD;  Location: [facility];  Service: Endoscopy;  Laterality: N/A;  help with transfers    There were no vitals filed for this visit.  Visit Diagnosis:  Abnormality of  gait  Abnormal increased muscle tone  Lack of coordination  Right leg weakness      Subjective Assessment - 08/18/15 1541    Subjective Pt. 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 at [facility] clinic in Aug. 2013;Pt. had UTI on 04-11-15; went to ED - received catherization; states she has had increased weakness and some mild decline in mobility since this problem   Patient Stated Goals improve walking and mobility   Currently in Pain? No/denies                         [facility] Adult PT Treatment/Exercise - 08/18/15 0001    Bed Mobility   Bed Mobility Supine to Sit;Sit to Supine   Supine to Sit 4: Min assist   Sit to Supine 3: Mod assist   Transfers   Transfers Sit to Stand;Stand to Sit   Sit to Stand 3: Mod assist   Sit to Stand Details Verbal cues for technique;Verbal cues for precautions/safety;Manual facilitation for weight shifting   Stand to Sit  3: Mod assist  needs cues to reach back    Stand to Sit Details (indicate cue type and reason) Verbal cues for technique;Verbal cues for precautions/safety   Ambulation/Gait   Ambulation/Gait Yes   Ambulation/Gait Assistance 4: Min assist   Ambulation/Gait Assistance Details cues to shift weight with occurrence of freezing episodes   Ambulation Distance (Feet) 240 Feet   Assistive device Rolling walker   Gait Pattern Wide base of support;Decreased hip/knee flexion - right;Decreased hip/knee flexion - left;Decreased stride length;Step-through pattern;Abducted- right;Decreased trunk rotation   Ambulation Surface Level;Indoor   Gait velocity 4" 47.75 secs  for 1 lap (120')   Timed Up and Go Test   Normal TUG (seconds) 59.9  with RW    Lumbar Exercises: Stretches   Passive Hamstring Stretch 2 reps;60 seconds   Single Knee to Chest Stretch 2  reps;10 seconds   Double Knee to Chest Stretch 1 rep;10 seconds   Lower Trunk Rotation 30 seconds   Lower Trunk Rotation Limitations L side tighter than R side   Hip Flexor Stretch 1 rep;30 seconds   Piriformis Stretch 2 reps;30 seconds  bil. LE's   Lumbar Exercises: Supine   Bridge 10 reps   Other Supine Lumbar Exercises hip abduction/adduction each leg 10 reps with mod assistance due to tightness in hip adductors                PT Education - 08/18/15 1549    Education provided Yes   Education Details bridging and hip flexor stretch   Person(s) Educated Patient   Methods Explanation;Demonstration;Handout   Comprehension Verbalized understanding;Returned demonstration          PT Short Term Goals - 06/28/15 1622    PT SHORT TERM GOAL #1   Title Pt. will increase standing tolerance so she is able to report standing 5" at home with S for independence with ADL's. (06-19-15)   Status Achieved   PT SHORT TERM GOAL #2   Title Increase distance in 3" walk test to >/= 100' for increased amb. speed and endurance.  (06-19-15)   Baseline 06/20/15: 160 feet  in 3 minutes with min guard assit to min assist   Status Achieved   PT SHORT TERM GOAL #3   Title Transfer sit to stand from wheelchair with min assist   (06-19-15)   Status Achieved   PT SHORT TERM GOAL #4   Title Independent in updated HEP (06-19-15)   PT SHORT TERM GOAL #5   Title Improve TUG score to </= 50 secs with RW with CGA (06-19-15)   Baseline 06/20/15: 46.40 sec's with walker   Status Achieved           PT Long Term Goals - 08/18/15 1550    PT LONG TERM GOAL #1   Title Pt. will ambulate 240' nonstop with RW to demo incr. endurance with CGA  (07-19-15)  new date 08-17-15   Baseline met 08-15-15   Status Achieved   PT LONG TERM GOAL #2   Title Report ability to stand at least 10" at home for improved standing tolerance  (07-19-15) new date 08-17-15   Baseline 5" reported on 08-15-15   Status Not Met   PT LONG TERM  GOAL #3   Title Pt. will transfer sit to to stand 5 times with UE support prn from mat with CGA to demo improved initial standing balance (07-19-15) new date 08-17-15   Baseline pt requires use of RW due to standing balance deficits  08-15-15   Status Partially Met   PT LONG TERM GOAL #4   Title Perform at least 15" on Nustep to demo improved activity tolerance/endurance  (07-19-15)   Baseline met 08-15-15   Status Achieved   PT LONG TERM GOAL #5   Title Pt. will demo technique to assist with step initiation after freezing episode  (07-19-15)   Baseline inconsistently met - pt needs min assist to fully perform (08-15-15)   Status Partially Met   PT LONG TERM GOAL #6   Title Improve TUG score to </=48 secs. with RW with CGA.  (07-19-15) new date 08-17-15   Baseline 59.90 secs with RW on 08-15-15   Status Not Met   PT LONG TERM GOAL #7   Title Transfer sit to stand 5 times with CGA for incr safety with transfers               Plan - 08/18/15 1554    Clinical Impression Statement Pt met LTG's # 1 and #4; #2 and #6 not met due to balance deficits; #3 and #5 partially met due to pt's ability to assist in performing but requiring assistance to fully complete the task and needing CGA to  min assist for safety due to balance deficits   Pt will benefit from skilled therapeutic intervention in order to improve on the following deficits Abnormal gait;Decreased coordination;Impaired flexibility;Difficulty walking;Decreased balance;Increased muscle spasms;Impaired tone;Decreased mobility;Decreased strength;Decreased activity tolerance;Decreased range of motion   Rehab Potential Fair   PT Frequency 2x / week   PT Duration 4 weeks   PT Treatment/Interventions ADLs/Self Care Home Management;Therapeutic activities;Patient/family education;Therapeutic exercise;Gait training;Balance training;Stair training;Neuromuscular re-education;Functional mobility training   PT Home Exercise Plan stretches and supine  strengthening exs.   Consulted and Agree with Plan of Care Patient        Problem List Patient Active Problem List   Diagnosis Date Noted  . Neurogenic bladder   . Acute recurrent cystitis 04/18/2015  . Pedal edema 04/01/2015  . Dyspepsia 01/09/2015  . Constipation 12/14/2014  . Medicare annual wellness visit, subsequent 11/27/2014  . Advanced care planning/counseling discussion 11/27/2014  . Osteoporosis   . B-complex deficiency 06/07/2014  . Recurrent falls 02/06/2014  . 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 Dysphagia, unspecified(787.20) 07/28/2013  . Lower extremity edema 09/26/2012  . Shoulder pain, left 07/20/2012  . 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 Routine health maintenance 08/26/2011  . Murmur 05/27/2011  . HYPERCHOLESTEROLEMIA  IIA 05/14/2010  . VENOUS INSUFFICIENCY, LEGS 06/21/2009  . CORONARY HEART DISEASE 06/09/2008  . Hypothyroidism 05/15/2008  . ASTHMA 05/15/2008  . Depression 11/07/2007  . 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 GERD 11/07/2007  . VAGINITIS, ATROPHIC 11/07/2007  PHYSICAL THERAPY DISCHARGE SUMMARY  Visits from Start of Care: 21  Current functional level related to goals / functional outcomes: See above for progress towards LTG's   Remaining deficits: Continued decr. Standing balance, incr. Muscle tone in trunk and bil. Lower extremities, decr. Independence with ambulation and transfers with pt requiring min to mod assist and use of RW for safety  Decr. Independence with bed mobility Education / Equipment: Pt. Has been instructed in a HEP for stretching and strengthening exercises and verbalizes understanding of this program. Plan: Patient agrees to discharge.  Patient goals were partially met. Patient is being discharged due to meeting the stated rehab goals.  ?????        [id number], FFMBW [id number], PT 08/18/2015, 3:59 PM  [facility] [address]  [place], [place],  [address] Phone: [phone]   Fax:  [phone]

## 2015-08-18 NOTE — Patient Instructions (Signed)
Bridging    Slowly raise buttocks from floor, keeping stomach tight. Repeat 10  times per set. Do __1__ sets per session. Do _1CAREGIVER ASSISTED: Hip Flexors - Side-Lying    Caregiver pulls leg backward, using other hand to push hips gently forward. Hold _30__ seconds. __2_ reps per set, _1__ sets per day, __5  days per week Place moving hand under knee to keep leg parallel to floor during backward pull.  Copyright  VHI. All rights reserved.  __1_ sessions per day.  http://orth.exer.us/1097   Copyright  VHI. All rights reserved.

## 2015-08-19 ENCOUNTER — Other Ambulatory Visit: Payer: Self-pay | Admitting: Family Medicine

## 2015-08-19 NOTE — Telephone Encounter (Signed)
plz phone in. 

## 2015-08-19 NOTE — Telephone Encounter (Signed)
Ok to refill 

## 2015-08-20 NOTE — Telephone Encounter (Signed)
Rx called in as directed.   

## 2015-08-21 ENCOUNTER — Telehealth: Payer: Self-pay | Admitting: Gastroenterology

## 2015-08-21 ENCOUNTER — Other Ambulatory Visit: Payer: Self-pay | Admitting: Gastroenterology

## 2015-08-21 MED ORDER — HYOSCYAMINE SULFATE 0.125 MG SL SUBL: 0.2500 mg | SUBLINGUAL_TABLET | SUBLINGUAL | Status: DC | PRN

## 2015-08-21 NOTE — Telephone Encounter (Signed)
Rx sent 

## 2015-08-21 NOTE — Telephone Encounter (Signed)
Ok and can refill her med

## 2015-08-21 NOTE — Telephone Encounter (Signed)
Pt sch'd for 10-25-15 @ 1:30 and needs her med refilled

## 2015-08-29 ENCOUNTER — Telehealth: Payer: Self-pay | Admitting: Neurology

## 2015-08-29 NOTE — Telephone Encounter (Signed)
[name] [name] called to say that she feels like she is having spasms all over her body (back, legs, etc). This started about 2-4 days ago. Her urologist is treating her for bladder spasms and GI doctor is treating her for stomach spasms. Nayah states that sometimes the spasms are painful. She would like to know if she needs to see you or PCP? And asks if this is something to expect in her disease process?

## 2015-08-29 NOTE — Telephone Encounter (Signed)
I spoke to [name] [name] and she is aware of recommendation. She will make appt with PCP and call us back if needed.

## 2015-08-29 NOTE — Telephone Encounter (Signed)
Sudden spasms are unusual. Something else may be going on, such as electrolyte disturbance, dehydration, etc. Probably best to see PCP. Please call patient back, thanks.

## 2015-08-29 NOTE — Telephone Encounter (Signed)
Patient is calling. She is experiencing body spasms all over her body. Please call and discuss. Thank you.

## 2015-08-30 ENCOUNTER — Encounter: Payer: Self-pay | Admitting: Internal Medicine

## 2015-08-30 ENCOUNTER — Ambulatory Visit (INDEPENDENT_AMBULATORY_CARE_PROVIDER_SITE_OTHER): Payer: Medicare Other | Admitting: Internal Medicine

## 2015-08-30 VITALS — BP 120/72 | HR 76 | Temp 97.9°F

## 2015-08-30 DIAGNOSIS — R109 Unspecified abdominal pain: Secondary | ICD-10-CM

## 2015-08-30 DIAGNOSIS — M62838 Other muscle spasm: Secondary | ICD-10-CM | POA: Diagnosis not present

## 2015-08-30 DIAGNOSIS — N3289 Other specified disorders of bladder: Secondary | ICD-10-CM | POA: Diagnosis not present

## 2015-08-30 LAB — BASIC METABOLIC PANEL
BUN: 12 mg/dL (ref 6–23)
CHLORIDE: 105 meq/L (ref 96–112)
CO2: 28 meq/L (ref 19–32)
Calcium: 9.8 mg/dL (ref 8.4–10.5)
Creatinine, Ser: 0.76 mg/dL (ref 0.40–1.20)
GFR: 78.78 mL/min (ref >60.00)
GLUCOSE: 116 mg/dL — AB (ref 70–99)
POTASSIUM: 4 meq/L (ref 3.5–5.1)
SODIUM: 140 meq/L (ref 135–145)

## 2015-08-30 LAB — MAGNESIUM: Magnesium: 2.1 mg/dL (ref 1.5–2.5)

## 2015-08-30 NOTE — Patient Instructions (Signed)

## 2015-08-30 NOTE — Progress Notes (Signed)
Pre visit review using our clinic review tool, if applicable. No additional management support is needed unless otherwise documented below in the visit note. 

## 2015-08-30 NOTE — Progress Notes (Signed)
Subjective:    Patient ID: [name] [name], female    DOB: [birth year], 74 y.o.   MRN: [id number]  HPI  Pt presents to the clinic today with c/o spasms. She reports the spasms can occur any place at any time. She describes it as a sudden onset of sharp, stabbing pains. It does not last very long. She is seeing a GI doctor for treatment of abdominal spasms. She is taking Dexilant and Levsin, but reports she has not noticed a difference. She is also seeing a urologist for treatment of bladder spasms. I do not see that she is taking anything for bladder spasms, other than cranberry tablets. 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 She follows with Dr. Rexene Alberts. She did call Dr. Rexene Alberts yesterday to let her know that she has been having these spasms. Dr. Rexene Alberts wanted to make sure that is was not an electrolyte deficiency or possible dehydration, so she referred her back to her PCP for labwork. She reports that she drinks water all day long, but because of her OAB, she urinates frequently. She would like blood work today. She also request that the blood work be sent to her neurologist.  Review of Systems  Past Medical History  Diagnosis Date  . GERD (gastroesophageal reflux disease)     s/p nissen  . Familial tremor     followed by Dr. Erling Cruz  . Unspecified chronic bronchitis ([place])   . Neurogenic bladder     Tannenbaum/MacDiarmid  . History of pneumonia   . Hypothyroidism   . Depression   . Asthma   . CAD (coronary artery disease)     myoview 5/08:  EF 76%, no scar, no ischemia, +ECG changes with exercise;  cath 04/08/07:  pLAD 30%, mLAD 60-70% - med tx.  Marland Kitchen HLD (hyperlipidemia)   . Multiple system atrophy [facility])     [name]  . VAGINITIS, ATROPHIC 11/07/2007  . Osteoporosis 2016    DEXA T-4.4 spine deteriorated since 2012  . UTI (urinary tract infection)     freq with her diease    Current Outpatient Prescriptions  Medication Sig Dispense Refill  .  AFLURIA PRESERVATIVE FREE injection     . albuterol (PROAIR HFA) 108 (90 BASE) MCG/ACT inhaler Inhale 2 puffs into the lungs every 6 (six) hours as needed for wheezing or shortness of breath.     Marland Kitchen alum & mag hydroxide-simeth (MYLANTA) 937-169-67 MG/5ML suspension Take 15 mLs by mouth every 6 (six) hours as needed for indigestion or heartburn.    Marland Kitchen aspirin (ASPIRIN EC) 81 MG EC tablet Take 81 mg by mouth 2 (two) times a week. Swallow whole. Take at bedtime    . beclomethasone (QVAR) 80 MCG/ACT inhaler Inhale 1 puff into the lungs daily as needed (wheezing).     . Calcium Carbonate-Vitamin D (CALCIUM-VITAMIN D) 500-200 MG-UNIT per tablet Take 1 tablet by mouth daily.      . clonazePAM (KLONOPIN) 0.5 MG tablet TAKE 1 TO 2 TABLETS BY MOUTH EVERY DAY AS NEEDED 60 tablet 3  . Cranberry (ELLURA) 200 MG CAPS Take 1 capsule by mouth daily.    . CRESTOR 20 MG tablet TAKE ONE-HALF TABLET BY MOUTH ONCE EVERY EVENING 45 tablet 2  . cyanocobalamin (,VITAMIN B-12,) 1000 MCG/ML injection Inject 1,000 mcg into the muscle every 30 (thirty) days.      Marland Kitchen DEXILANT 60 MG capsule TAKE 1 CAPSULE (60 MG TOTAL) BY MOUTH DAILY. [place]  capsule 3  . fluocinolone (VANOS) 0.01 % cream Apply 1 application topically 2 (two) times daily as needed (itching).     . furosemide (LASIX) 20 MG tablet Take 1 tablet (20 mg total) by mouth daily as needed for fluid or edema. 30 tablet 0  . hydrOXYzine (ATARAX/VISTARIL) 10 MG tablet Take 10 mg by mouth 3 (three) times daily as needed for itching.    . hyoscyamine (LEVSIN SL) 0.125 MG SL tablet Place 2 tablets (0.25 mg total) under the tongue every 4 (four) hours as needed (spasms). 30 tablet 1  . ibandronate (BONIVA) 3 MG/3ML SOLN injection Inject 3 mg into the vein once. Infuse every 3 months    . isosorbide mononitrate (IMDUR) 30 MG 24 hr tablet TAKE 1/2 TABLET BY MOUTH DAILY 30 tablet 5  . levothyroxine (SYNTHROID, LEVOTHROID) 112 MCG tablet Take 112 mcg by mouth daily before breakfast.    .  loratadine (CLARITIN) 10 MG tablet Take 10 mg by mouth 2 (two) times daily as needed (allergies).     . mometasone (NASONEX) 50 MCG/ACT nasal spray Place 2 sprays into the nose daily as needed (allergies).     . nitroGLYCERIN (NITROSTAT) 0.4 MG SL tablet Place 1 tablet (0.4 mg total) under the tongue every 5 (five) minutes as needed (chest pain). 25 tablet 3  . polyethylene glycol powder (GLYCOLAX/MIRALAX) powder Take 17 g by mouth daily as needed for moderate constipation. 250 g 1  . propranolol (INDERAL) 10 MG tablet TAKE 1 TABLET BY MOUTH 3 TIMES DAILY AS NEEDED (TREMORS). 180 tablet 3  . sertraline (ZOLOFT) 25 MG tablet TAKE 1 TABLET (25 MG TOTAL) BY MOUTH EVERY MORNING. 30 tablet 11  . triamcinolone cream (KENALOG) 0.1 % Apply 1 application topically daily.     No current facility-administered medications for this visit.    Allergies  Allergen Reactions  . Keflex [Cephalexin] Diarrhea    Family History  Problem Relation Age of Onset  . Coronary artery disease Father   . Heart attack Father   . Diabetes Daughter   . Breast cancer Maternal Aunt   . Hypertension Mother   . Heart disease Father   . Coronary artery disease Daughter   . Colon cancer Maternal Aunt   . Multiple sclerosis Daughter   . Stroke Daughter   . Heart attack Daughter     Social History   Social History  . Marital Status: Married    Spouse Name: [name]  . Number of Children: 2  . Years of Education: HS   Occupational History  .  Other   Social History Main Topics  . Smoking status: Never Smoker   . Smokeless tobacco: Never Used  . Alcohol Use: No  . Drug Use: No  . Sexual Activity: Not Currently   Other Topics Concern  . Not on file   Social History Narrative   HSG, retired from [employer] . married '60. 1 son - '65; 1 daughter - '61; 2 grandchildren; 1 great-grandchild. Homemaker. marriage in good health. 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 Positive history of passive tobacco smoke exposure.    Caffeine Use: 1 cup daily; 2 glasses of tea daily     Constitutional: Pt reports fatigue. Denies fever, malaise, headache or abrupt weight changes.  Respiratory: Denies difficulty breathing, shortness of breath, cough or sputum production.   Cardiovascular: Denies chest pain, chest tightness, palpitations or swelling in the hands or feet.  Gastrointestinal: Pt reports abdominal spasms. Denies, bloating, constipation, diarrhea or blood  in the stool.  GU: Pt reports bladder spasms. Denies urgency, frequency, pain with urination, burning sensation, blood in urine, odor or discharge. Musculoskeletal: Pt reports muscle spasms.    No other specific complaints in a complete review of systems (except as listed in HPI above).     Objective:   Physical Exam  BP 120/72 mmHg  Pulse 76  Temp(Src) 97.9 F (36.6 C) (Oral)  Wt   SpO2 97% Wt Readings from Last 3 Encounters:  04/03/15 145 lb (65.772 kg)  01/11/15 150 lb (68.04 kg)  01/09/15 151 lb (68.493 kg)    General: Appears her stated age, chronically ill appearing in NAD. Skin: Warm, dry and intact.  Cardiovascular: Normal rate and rhythm. S1,S2 noted. ? Murmur. Pulmonary/Chest: Normal effort and positive vesicular breath sounds.  Abdomen: Soft and nontender. Normal bowel sounds. Musculoskeletal: Difficult to assess as she is in a wheelchair and can not get on the exam table. Neurological: Alert and oriented. She does have a resting tremor.  BMET    Component Value Date/Time   NA 137 04/11/2015 0419   K 3.9 04/11/2015 0419   CL 102 04/11/2015 0419   CO2 26 04/11/2015 0419   GLUCOSE 128* 04/11/2015 0419   BUN 9 04/11/2015 0419   CREATININE 0.76 04/11/2015 0419   CALCIUM 8.9 04/11/2015 0419   GFRNONAA >60 04/11/2015 0419   GFRAA >60 04/11/2015 0419    Lipid Panel     Component Value Date/Time   CHOL 130 11/20/2014 1301   TRIG 115.0 11/20/2014 1301   HDL 43.90 11/20/2014 1301   CHOLHDL 3 11/20/2014 1301   VLDL 23.0  11/20/2014 1301   LDLCALC 63 11/20/2014 1301    CBC    Component Value Date/Time   WBC 11.1* 04/11/2015 0419   RBC 4.69 04/11/2015 0419   HGB 13.3 04/11/2015 0419   HCT 40.0 04/11/2015 0419   PLT 267 04/11/2015 0419   MCV 85.3 04/11/2015 0419   MCH 28.4 04/11/2015 0419   MCHC 33.3 04/11/2015 0419   RDW 13.5 04/11/2015 0419   LYMPHSABS 2.1 04/11/2015 0419   MONOABS 1.1* 04/11/2015 0419   EOSABS 0.1 04/11/2015 0419   BASOSABS 0.0 04/11/2015 0419    Hgb A1C No results found for: HGBA1C       Assessment & Plan:   Bladder, Abdominal and Muscle Spasms:  ? 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 No medication changes at this time Encouraged her to drink plenty of fluids  Follow up with PCP as scheduled, sooner if needed

## 2015-08-31 ENCOUNTER — Telehealth: Payer: Self-pay | Admitting: Cardiology

## 2015-08-31 NOTE — Telephone Encounter (Signed)
Pt called concerned about her history of "spasms" causing a spasm in the arteries of her heart. I told her this was highly unlikely. She is not having chest pain.  I also reassured her that labs drawn yesterday were normal.  Consuelo Suthers PA-C 08/31/2015 1:48 PM

## 2015-09-05 ENCOUNTER — Encounter: Payer: Self-pay | Admitting: Family Medicine

## 2015-09-18 ENCOUNTER — Encounter: Payer: Self-pay | Admitting: Neurology

## 2015-09-18 ENCOUNTER — Ambulatory Visit (INDEPENDENT_AMBULATORY_CARE_PROVIDER_SITE_OTHER): Payer: Medicare Other | Admitting: Neurology

## 2015-09-18 VITALS — BP 138/80 | HR 68 | Resp 14

## 2015-09-18 DIAGNOSIS — G232 Striatonigral degeneration: Secondary | ICD-10-CM

## 2015-09-18 DIAGNOSIS — M62838 Other muscle spasm: Secondary | ICD-10-CM | POA: Diagnosis not present

## 2015-09-18 DIAGNOSIS — E538 Deficiency of other specified B group vitamins: Secondary | ICD-10-CM | POA: Diagnosis not present

## 2015-09-18 MED ORDER — CYANOCOBALAMIN 1000 MCG/ML IJ SOLN
1000.0000 ug | Freq: Once | INTRAMUSCULAR | Status: AC
  Administered 2015-09-18: 1000 ug via INTRAMUSCULAR

## 2015-09-18 MED ORDER — BACLOFEN 10 MG PO TABS: 5.0000 mg | ORAL_TABLET | Freq: Three times a day (TID) | ORAL | Status: DC

## 2015-09-18 NOTE — Progress Notes (Signed)
Subjective:    Patient ID: [name] [name] is a 74 y.o. female.  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 She is accompanied by her husband again today. I last saw her on 07/29/2015, at which time she reported doing fairly well. No choking, no recent falls and he her husband was helping her with mobilizing with a walker. She had to go to the emergency room on 04/11/2015 for UTI. She was in outpatient physical therapy. She was seeing her urologist regularly and they had talked about Botox injections and potentially a surgically placed catheter. She had no headaches. She had a follow-up phone call from the [facility] about a potential research study but she did not get encouragement to pursue it from the doctor. In the interim, she calls in October 2016 reporting a 2-4 day history of increased muscle spasms. She felt that she had spasms all over. She was encouraged to see her primary care provider. She was seen and had blood work which showed no abnormality of her electrolytes, kidney function. Her magnesium level was normal.   Today, 09/18/2015: She reports being bothered by muscle spasms which are in different places. These are not cramps. She has smaller spasms in different areas including sides, lower back, neck, thighs and sometimes these are painful and bothersome. Otherwise, she is unchanged. She had a thyroid function test in May of this year which was fine.  Previously:  I saw her on 03/27/2015, at which time she reported new left hand numbness for the past 1 month. She woke up with left hand symptoms. She had some numbness  and tingling. Her husband noted that she was leaning to the left. Her last brain MRI was on 07/30/2015 which showed no acute abnormality. I reviewed the MRI last time. She denied any slurring of speech, droopy face, lower extremity weakness. She had some lower extremity swelling which was worse. Her left hand numbness improved after a few days. Her leaning improved. She had finished physical therapy in April 2016 and felt it was helpful and wondered if she could restart it. I referred her back to physical therapy outpatient. I suggested we proceed with a brain MRI. She had a brain MRI without contrast on 04/23/2015 and we called her subsequently with her test results: Abnormal MRI brain (without) demonstrating:   1. Small left fronto-parietal, parasagittal focus of encephalomalacia and gliosis, possible a chronic ischemic infarction.   2. Few scattered periventricular foci of non-specific gliosis. 3. No acute findings. 4. No significant change from MRI on 07/29/12.  In addition, I personally reviewed the images through the PACS system.  I saw her on 12/11/2014, at which time she reported feeling about the same as far as her motor symptoms. She had more freezing, especially in her right leg. She was in physical therapy and felt it was helpful. She was trying to do stretching at home. Her husband reported that he does not feel comfortable leaving her at home alone. They were in touch with [facility] over the phone but there were no updates on research for multiple system atrophy. She has a call alert button. She had no recent falls.  She was having trouble going to sleep. She was sleeping in her lift chair. She had new reading glasses. She had diarrhea in the past when she tried Linzess through her GI doctor. She requested a sooner than scheduled appointment due to abnormal posture and left-sided numbness.  I saw her on 07/05/2014, at which time she reported having had more falls. She had not been seen at the  [facility] clinic. Stem cell trial was discussed and another trial in [place] with alpha-synuclein depleting therapy at the last visit there. She was eating well and her spirits were good. We restarted outpatient physical therapy and she has been going to the neuro rehabilitation unit.    I saw her on 02/02/2014, at which time she reported that physical therapy was helpful. She had fallen a few times and bruised her ribs. She had a colonoscopy on 08/29/2013. She reported a history of hemorrhoids and chronic constipation for which she had tried Dulcolax. I referred her back to physical therapy. 4 chronic constipation I asked her to try Linzess. I did not suggest any other new medications. She goes to the [facility] once a year and had an appointment in on 04/27/14 and I reviewed the note and shared the data with them. She had previously tried and failed Sinemet.   I saw her on 09/05/2013, at which time she presented for a sooner than scheduled appointment due to recurrent falls. She had fallen 6 times within about 1-1/2 weeks' time. 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 I advised her to use her walker at all times. We talked about potentially going to a motorized wheelchair down the [place]. She was advised to drink more water. She had tried and failed Sinemet in the past. I suggested a second opinion at [facility] which they declined. I referred her to physical and occupational therapy outpatient. In the interim, I prescribed a light weight wheelchair for her. Her daughter recently died. She had advanced MS.   I first met her on 05/10/2013, at which time we discussed the possibility of MSA. I did not make any medication changes. We talked about the nature of progressive parkinsonism and gait disorder. She had her last outpatient PT in July 2014. She tried to do stretching at home. She had a colonoscopy.    She previously followed with Dr. Morene Antu and was last seen by him on 12/30/2012, at which time he felt that she had worsening of her gait. She also had symptoms of vertigo. She has been on baby aspirin, Synthroid, Crestor, and/or, clonazepam, propranolol, sertraline, trimethoprim, Nasonex, Provera, nitroglycerin as needed, vitamin B12.   She was diagnosed about 20 years ago with essential tremor. She had head tremor more than upper extremity tremor. She has a history of lung disease but was able to take low-dose propranolol. She has not tried primidone. She has had gait and balance problems in the last 5+ years approximately. She has to hold onto something. She has had bladder incontinence since her 65s. She was on Crestor for 3 years which was discontinued in June 2011. CK was normal and EMG nerve conduction studies were normal in June 2011. Crestor was restarted. She has been on B12 injections without subjective improvement. MRI brain with and without contrast showed hyperostosis frontalis, MRI C-spine showed mild degenerative joint disease. MRI of T-spine showed fluid collection posterior to the cord, likely a  benign arachnoid cyst. Lumbar spine MRI from July 2012 showed mild degenerative disc disease. 4 gait dysfunction she was referred to Dr. Gilford Rile with a question of myelopathy versus orthostatic tremor versus lower half parkinsonism versus essential tremor with gait disorder. 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 Further testing revealed negative tilt table test but there was mild autonomic neuropathy per autonomic reflex screen. She has had swelling in both legs with negative Doppler of the legs and normal echocardiogram. Additional workup at [facility] included vitamin D, paraneoplastic  antibodies, anti-GAD, all negative.   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 She has been using a 2 wheeled walker for the past 2 years.   She has been in PT and OT with improvement.   She endorsed a lot of stress, due primarily to her mother's and daughter's health, who died in 02/22/14. Mother is in her 78s and in a NH. She sleeps in a recliner, d/t pain in her trunk, back, neck and stiffness and difficulty getting in and out of bed.  Her Past Medical History Is Significant For: Past Medical History  Diagnosis Date  . GERD (gastroesophageal reflux disease)     s/p nissen  . Familial tremor     followed by Dr. Erling Cruz  . Unspecified chronic bronchitis ([place])   . Neurogenic bladder     Tannenbaum/MacDiarmid  . History of pneumonia   . Hypothyroidism   . Depression   . Asthma   . CAD (coronary artery disease)     myoview 5/08:  EF 76%, no scar, no ischemia, +ECG changes with exercise;  cath 04/08/07:  pLAD 30%, mLAD 60-70% - med tx.  Marland Kitchen HLD (hyperlipidemia)   . 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 VAGINITIS, ATROPHIC 11/07/2007  . Osteoporosis 2016    DEXA T-4.4 spine deteriorated since 2012  . Chronic cystitis     freq with her diease    Her Past Surgical History Is Significant For: Past Surgical History  Procedure Laterality Date  . Laparoscopic nissen fundoplication    . Cystectomy    . Tumor removal    . Tubal ligation    . Colonoscopy N/A 08/29/2013    int hemm; Inda Castle, MD  . Dexa  12/2014    T -4.4 spine  . Esophagogastroduodenoscopy N/A 01/09/2015    Procedure: ESOPHAGOGASTRODUODENOSCOPY (EGD);  Surgeon: Inda Castle, MD;  Location: [facility];  Service: Endoscopy;  Laterality: N/A;  help with transfers    Her Family History Is Significant For: Family History  Problem Relation Age of Onset  . Coronary artery disease Father   . Heart attack Father   . Diabetes Daughter   .  Breast cancer Maternal Aunt   . Hypertension Mother   . Heart disease Father   . Coronary artery disease Daughter   . Colon cancer Maternal Aunt   . Multiple sclerosis Daughter   . Stroke Daughter   . Heart attack Daughter     Her Social History Is Significant For: Social History   Social History  . Marital Status: Married    Spouse Name: [name]  . Number of Children: 2  .  Years of Education: HS   Occupational History  .  Other   Social History Main Topics  . Smoking status: Never Smoker   . Smokeless tobacco: Never Used  . Alcohol Use: No  . Drug Use: No  . Sexual Activity: Not Currently   Other Topics Concern  . None   Social History Narrative   HSG, retired from [employer] . married '60. 1 son - '65; 1 daughter - '61; 2 grandchildren; 1 great-grandchild. Homemaker. marriage in good health. 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 Positive history of passive tobacco smoke exposure.   Caffeine Use: 1 cup daily; 2 glasses of tea daily    Her Allergies Are:  Allergies  Allergen Reactions  . Keflex [Cephalexin] Diarrhea  :   Her Current Medications Are:  Outpatient Encounter Prescriptions as of 09/18/2015  Medication Sig  . AFLURIA PRESERVATIVE FREE injection   . albuterol (PROAIR HFA) 108 (90 BASE) MCG/ACT inhaler Inhale 2 puffs into the lungs every 6 (six) hours as needed for wheezing or shortness of breath.   Marland Kitchen alum & mag hydroxide-simeth (MYLANTA) 545-625-63 MG/5ML suspension Take 15 mLs by mouth every 6 (six) hours as needed for indigestion or heartburn.  Marland Kitchen aspirin (ASPIRIN EC) 81 MG EC tablet Take 81 mg by mouth 2 (two) times a week. Swallow whole. Take at bedtime  . beclomethasone (QVAR) 80 MCG/ACT inhaler Inhale 1 puff into the lungs daily as needed (wheezing).   . Calcium Carbonate-Vitamin D (CALCIUM-VITAMIN D) 500-200 MG-UNIT per tablet Take 1 tablet by mouth daily.    . clonazePAM (KLONOPIN) 0.5 MG tablet TAKE 1 TO 2 TABLETS BY MOUTH EVERY DAY AS NEEDED  . Cranberry  (ELLURA) 200 MG CAPS Take 1 capsule by mouth daily.  . CRESTOR 20 MG tablet TAKE ONE-HALF TABLET BY MOUTH ONCE EVERY EVENING  . cyanocobalamin (,VITAMIN B-12,) 1000 MCG/ML injection Inject 1,000 mcg into the muscle every 30 (thirty) days.    Marland Kitchen DEXILANT 60 MG capsule TAKE 1 CAPSULE (60 MG TOTAL) BY MOUTH DAILY.  . fluocinolone (VANOS) 0.01 % cream Apply 1 application topically 2 (two) times daily as needed (itching).   . furosemide (LASIX) 20 MG tablet Take 1 tablet (20 mg total) by mouth daily as needed for fluid or edema.  . hydrOXYzine (ATARAX/VISTARIL) 10 MG tablet Take 10 mg by mouth 3 (three) times daily as needed for itching.  . hyoscyamine (LEVSIN SL) 0.125 MG SL tablet Place 2 tablets (0.25 mg total) under the tongue every 4 (four) hours as needed (spasms).  . ibandronate (BONIVA) 3 MG/3ML SOLN injection Inject 3 mg into the vein once. Infuse every 3 months  . isosorbide mononitrate (IMDUR) 30 MG 24 hr tablet TAKE 1/2 TABLET BY MOUTH DAILY  . levothyroxine (SYNTHROID, LEVOTHROID) 112 MCG tablet Take 112 mcg by mouth daily before breakfast.  . loratadine (CLARITIN) 10 MG tablet Take 10 mg by mouth 2 (two) times daily as needed (allergies).   . mometasone (NASONEX) 50 MCG/ACT nasal spray Place 2 sprays into the nose daily as needed (allergies).   . nitroGLYCERIN (NITROSTAT) 0.4 MG SL tablet Place 1 tablet (0.4 mg total) under the tongue every 5 (five) minutes as needed (chest pain).  . polyethylene glycol powder (GLYCOLAX/MIRALAX) powder Take 17 g by mouth daily as needed for moderate constipation.  . propranolol (INDERAL) 10 MG tablet TAKE 1 TABLET BY MOUTH 3 TIMES DAILY AS NEEDED (TREMORS).  Marland Kitchen sertraline (ZOLOFT) 25 MG tablet TAKE 1 TABLET (25 MG TOTAL) BY MOUTH EVERY  MORNING.  Marland Kitchen triamcinolone cream (KENALOG) 0.1 % Apply 1 application topically daily.   No facility-administered encounter medications on file as of 09/18/2015.  :  Review of Systems:  Out of a complete 14 point review of  systems, all are reviewed and negative with the exception of these symptoms as listed below:   Review of Systems  Neurological:       Patient reports that she is still having muscles spasms all over. Recently saw PCP and had blood work done.     Objective:  Neurologic Exam  Physical Exam Physical Examination:   Filed Vitals:   09/18/15 1504  BP: 138/80  Pulse: 68  Resp: 14   General Examination: The patient is a very pleasant 74 y.o. female in no acute distress. She appears well-developed and well-nourished and well groomed. She is in good spirits today, her usual self. She is situated in her wheelchair.  HEENT: Normocephalic, atraumatic, pupils are equal, round and reactive to light and accommodation. Extraocular tracking shows mild limitation to upper gaze. She has saccadic breakdown of smooth pursuit but no nystagmus. Hearing is mildly impaired. She has hearing aids in place. She has mild facial masking and normal facial sensation. Speech shows mild to moderate hypophonia with mild dysarthria and mild voice tremor. Neck shows moderate rigidity. No spasms noted. There is a mild head, no-no type tremor. There are no carotid bruits on auscultation. Head is tilted to the L and she tends to lean a little bit to the left in her wheelchair, better from last time. Oropharynx exam reveals: mild to moderate mouth dryness, adequate dental hygiene and mild airway crowding, due to narrow airway. Mallampati is class II.   Chest: Clear to auscultation without wheezing, rhonchi or crackles noted.  Heart: S1+S2+0, regular and normal without murmurs, rubs or gallops noted.   Abdomen: Soft, non-tender and non-distended with normal bowel sounds appreciated on auscultation.  Extremities: There is 1-2+ pitting edema in the distal lower extremities bilaterally, stable.    Skin: Warm and dry without trophic changes noted. She has some smaller bruises on her hands.   Musculoskeletal: exam reveals no  obvious joint deformities, tenderness or joint swelling or erythema.   Neurologically:  Mental status: The patient is awake, alert and oriented in all 4 spheres. Her memory, attention, language and knowledge are fairly well preserved. There is no aphasia, agnosia, apraxia or anomia. Speech is mildly tremulous and slightly dysarthric with normal prosody and enunciation. Thought process is linear. Mood is congruent and affect is normal.  Cranial nerves are as described above under HEENT exam. In addition, shoulder shrug is normal with equal shoulder height noted. Motor exam: she has thin bulk, 4/5 global strength and tone is increased throughout, moderately, R>L. There is no drift, mild intermittent resting tremor in the RUE, no rebound. Grip strength is a little bit worse on the right. She has a mild action tremor bilaterally as well as a mild postural tremor, right worse than left. Reflexes are 2+ in the UEs and 3+ in the LEs, unchanged. Fine motor skills are moderately impaired in the UEs and severely impaired in the LEs, with no significant lateralization, perhaps a little worse on the R, unchanged from before.  Cerebellar testing shows no dysmetria or intention tremor on finger to nose testing. There is no truncal or gait ataxia.  Sensory exam is intact to light touch in both upper and lower extremities.  Gait, station and balance: I did not try to stand  or walk her today, she requires maximum assistance.   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 She has progressed with time, as expected. She reports more  generalize muscle spasms which are bothersome to her. She has had outpatient physical therapy and she met her short-term goals. She will try to resume outpatient therapy in January 2017. Otherwise, her exam has remained stable. She had a brain MRI in June 2016 with stable findings compared to September 2013, which was reassuring.  I again talked to the patient and her husband about her atypical parkinsonism and the challenges of this. She has progressed with time. She is barely able to use her walker even with assistance at this time. She has fallen repeatedly in the past but not recently thankfully. She is primarily confined to her wheelchair and sleeps in her lift chair. She has been seeing a GI specialist. She sees a urologist for her bladder spasms and recurrent urinary tract infections. Thankfully, lately she has done well. She had talked about Botox injections into her bladder before. Ultimately, she may need a surgically placed catheter. Today, I suggested we start her on a trial of low-dose baclofen, 10 mg strength, half a pill up to 3 times a day. She is advised about the potential side effects including sedation, mouth dryness, confusion and balance problems. I would like to start this cautiously because of these reasons. She is advised that muscle spasms could be related to her underlying MSA.  She has an appointment pending with me in March 2017 which I asked her to keep. I provided her with a new prescription for baclofen 10 mg strength half a pill 3 times a day as needed, #45 with refills.  She received a B12 shot today. This was given to her by Leighton Parody, RN before patient left today. I spent 25 minutes in total face-to-face time with the patient, more than 50% of which was spent in counseling and coordination of care, reviewing test results, reviewing medication and discussing or reviewing the diagnosis of MSA, its prognosis and treatment options.

## 2015-09-18 NOTE — Patient Instructions (Addendum)
For your muscle spasms we can try you on a low-dose muscle relaxer. I will provide you with a prescription. Please be aware that muscle relaxers in general can make you quite sleepy.  We will start you on baclofen 10 mg strength, take half a pill as needed, up to 3 times a day. Side effects include dizziness, grogginess, balance problems, sleepiness.   For senior care, try calling [facility] [phone].

## 2015-09-18 NOTE — Addendum Note (Signed)
Addended by: Laurence Spates on: 09/18/2015 04:17 PM   Modules accepted: Orders

## 2015-09-23 ENCOUNTER — Other Ambulatory Visit: Payer: Self-pay | Admitting: Internal Medicine

## 2015-09-25 DIAGNOSIS — N1330 Unspecified hydronephrosis: Secondary | ICD-10-CM | POA: Diagnosis not present

## 2015-09-25 DIAGNOSIS — N3281 Overactive bladder: Secondary | ICD-10-CM | POA: Diagnosis not present

## 2015-09-25 DIAGNOSIS — R350 Frequency of micturition: Secondary | ICD-10-CM | POA: Diagnosis not present

## 2015-09-25 DIAGNOSIS — N3020 Other chronic cystitis without hematuria: Secondary | ICD-10-CM | POA: Diagnosis not present

## 2015-09-27 ENCOUNTER — Encounter: Payer: Self-pay | Admitting: Family Medicine

## 2015-09-27 ENCOUNTER — Encounter: Payer: Self-pay | Admitting: *Deleted

## 2015-10-03 ENCOUNTER — Other Ambulatory Visit: Payer: Self-pay | Admitting: Family Medicine

## 2015-10-06 ENCOUNTER — Other Ambulatory Visit: Payer: Self-pay | Admitting: Cardiovascular Disease

## 2015-10-08 DIAGNOSIS — N1330 Unspecified hydronephrosis: Secondary | ICD-10-CM | POA: Diagnosis not present

## 2015-10-08 DIAGNOSIS — N390 Urinary tract infection, site not specified: Secondary | ICD-10-CM | POA: Diagnosis not present

## 2015-10-12 ENCOUNTER — Other Ambulatory Visit: Payer: Self-pay | Admitting: Family Medicine

## 2015-10-17 DIAGNOSIS — L270 Generalized skin eruption due to drugs and medicaments taken internally: Secondary | ICD-10-CM | POA: Diagnosis not present

## 2015-10-17 DIAGNOSIS — R21 Rash and other nonspecific skin eruption: Secondary | ICD-10-CM | POA: Diagnosis not present

## 2015-10-21 ENCOUNTER — Ambulatory Visit: Payer: Self-pay | Admitting: Internal Medicine

## 2015-10-24 DIAGNOSIS — N3281 Overactive bladder: Secondary | ICD-10-CM | POA: Diagnosis not present

## 2015-10-24 DIAGNOSIS — N3020 Other chronic cystitis without hematuria: Secondary | ICD-10-CM | POA: Diagnosis not present

## 2015-10-24 DIAGNOSIS — R350 Frequency of micturition: Secondary | ICD-10-CM | POA: Diagnosis not present

## 2015-10-25 ENCOUNTER — Other Ambulatory Visit: Payer: Self-pay | Admitting: Family Medicine

## 2015-10-25 ENCOUNTER — Ambulatory Visit: Payer: Self-pay | Admitting: Internal Medicine

## 2015-11-21 ENCOUNTER — Telehealth: Payer: Self-pay | Admitting: Neurology

## 2015-11-21 DIAGNOSIS — E538 Deficiency of other specified B group vitamins: Secondary | ICD-10-CM

## 2015-11-21 DIAGNOSIS — R200 Anesthesia of skin: Secondary | ICD-10-CM

## 2015-11-21 DIAGNOSIS — R269 Unspecified abnormalities of gait and mobility: Secondary | ICD-10-CM

## 2015-11-21 DIAGNOSIS — R293 Abnormal posture: Secondary | ICD-10-CM

## 2015-11-21 DIAGNOSIS — G20C Parkinsonism, unspecified: Secondary | ICD-10-CM

## 2015-11-21 DIAGNOSIS — G20 Parkinson's disease: Secondary | ICD-10-CM

## 2015-11-21 DIAGNOSIS — R296 Repeated falls: Secondary | ICD-10-CM

## 2015-11-21 DIAGNOSIS — R202 Paresthesia of skin: Secondary | ICD-10-CM

## 2015-11-21 NOTE — Telephone Encounter (Signed)
Order put in. Called to let patient know but she was not at home.

## 2015-11-21 NOTE — Telephone Encounter (Signed)
Pt called and would like to resume outpt therapy. She needs a referral sent to [facility] next door. May call [phone]

## 2015-12-02 ENCOUNTER — Other Ambulatory Visit: Payer: Self-pay | Admitting: Family Medicine

## 2015-12-05 ENCOUNTER — Ambulatory Visit: Payer: Medicare Other | Attending: Family Medicine | Admitting: Physical Therapy

## 2015-12-05 ENCOUNTER — Encounter: Payer: Self-pay | Admitting: Physical Therapy

## 2015-12-05 DIAGNOSIS — M6289 Other specified disorders of muscle: Secondary | ICD-10-CM | POA: Diagnosis not present

## 2015-12-05 DIAGNOSIS — R269 Unspecified abnormalities of gait and mobility: Secondary | ICD-10-CM | POA: Diagnosis not present

## 2015-12-05 DIAGNOSIS — R279 Unspecified lack of coordination: Secondary | ICD-10-CM | POA: Insufficient documentation

## 2015-12-05 DIAGNOSIS — R29898 Other symptoms and signs involving the musculoskeletal system: Secondary | ICD-10-CM | POA: Diagnosis not present

## 2015-12-05 DIAGNOSIS — M439 Deforming dorsopathy, unspecified: Secondary | ICD-10-CM | POA: Diagnosis not present

## 2015-12-08 ENCOUNTER — Encounter: Payer: Self-pay | Admitting: Physical Therapy

## 2015-12-08 NOTE — Therapy (Signed)
[facility] [address] [place], [place], [address] Phone: [phone]   Fax:  [phone]  Physical Therapy Evaluation  Patient Details  Name: [name] [name] MRN: [id number] Date of Birth: 13-Oct-1941 Referring Provider: Dr. Star Age  Encounter Date: 12/05/2015      PT End of Session - 12/08/15 1315    Visit Number 1  G1   Number of Visits 17   Date for PT Re-Evaluation 02/04/16   Authorization Type [facility] [facility]   Authorization Time Period 12-05-15 - 02-04-16   PT Start Time 1534   PT Stop Time 1620   PT Time Calculation (min) 46 min   Equipment Utilized During Treatment Gait belt      Past Medical History  Diagnosis Date  . GERD (gastroesophageal reflux disease)     s/p nissen  . Familial tremor     followed by Dr. Erling Cruz  . Unspecified chronic bronchitis ([place])   . Neurogenic bladder     Tannenbaum/MacDiarmid  . History of pneumonia   . Hypothyroidism   . Depression   . Asthma   . CAD (coronary artery disease)     myoview 5/08:  EF 76%, no scar, no ischemia, +ECG changes with exercise;  cath 04/08/07:  pLAD 30%, mLAD 60-70% - med tx.  Marland Kitchen HLD (hyperlipidemia)   . 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 VAGINITIS, ATROPHIC 11/07/2007  . Osteoporosis 2016    DEXA T-4.4 spine deteriorated since 2012  . Chronic cystitis     recurrent UTIs, started bactrim ppx (McDiarmid)  . Hemorrhoids   . Internal hemorrhoids   . Hydronephrosis, bilateral     Past Surgical History  Procedure Laterality Date  . Laparoscopic nissen fundoplication    . Cystectomy    . Tumor removal    . Tubal ligation    . Colonoscopy N/A 08/29/2013    int hemm; Inda Castle, MD  . Dexa  12/2014    T -4.4 spine  . Esophagogastroduodenoscopy N/A 01/09/2015    Procedure: ESOPHAGOGASTRODUODENOSCOPY (EGD);  Surgeon: Inda Castle, MD;  Location: [facility];  Service: Endoscopy;  Laterality: N/A;  help with  transfers    There were no vitals filed for this visit.  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 at [facility] clinic in Aug. 2013;Pt. had UTI on 04-11-15; went to ED - received catherization; states she has had increased weakness and some mild decline in mobility since this problem   Patient Stated Goals improve walking and mobility   Currently in Pain? No/denies            [facility] PT Assessment - 12/08/15 0001    Assessment   Medical Diagnosis Multiple Systems Atrophy   Referring Provider Dr. Star Age   Onset Date/Surgical Date --  2012 with  MSA diagnosis May 2014   Prior Therapy pt had PT at this facility from July - Oct. 2016   Precautions   Precautions Fall   Balance Screen   Has the patient fallen in the past 6 months No   Has the patient had a decrease in activity level because of a fear of falling?  No   Is the patient reluctant to leave their home because of a fear of falling?  No   Home Environment   Living Environment Private residence   Living Arrangements Spouse/significant other   Type of [place] entrance  back entrance   [place] One level   Prior Function   Level of Independence Needs assistance with ADLs;Needs assistance with gait;Needs assistance with transfers;Needs assistance with homemaking   Posture/Postural Control    Posture/Postural Control Postural limitations   Postural Limitations Rounded Shoulders;Forward head;Weight shift left   ROM / Strength   AROM / PROM / Strength AROM  dyskinesia in R foot with uncontrolled movement   Transfers   Transfers Sit to Stand;Stand to Sit   Sit to Stand 3: Mod assist   Sit to Stand Details Other (comment)   Sit to Stand Details (indicate cue type and reason) --  pt has much difficulty scooting forward to edge of chair   Stand to Sit 3: Mod assist   Ambulation/Gait   Ambulation/Gait Yes   Ambulation/Gait Assistance 4: Min assist  +1 assist for following with wheelchair; mod assist with LOB   Ambulation/Gait Assistance Details cues to stand erect and incr. step length as able   Ambulation Distance (Feet) 30 Feet  64' 2nd rep   Assistive device Rolling walker   Gait Pattern Wide base of support;Decreased hip/knee flexion - right;Decreased hip/knee flexion - left;Decreased stride length;Step-through pattern;Abducted- right;Decreased trunk rotation   Ambulation Surface Level;Indoor   RLE Tone   RLE Tone Hypertonic                             PT Short Term Goals - 12/08/15 1329    PT SHORT TERM GOAL #1   Title Pt. will increase standing tolerance so she is able to report standing 5" at home with S for independence with ADL's. (01-04-16)   Time 4   Period Weeks   Status New   PT SHORT TERM GOAL #2   Title Amb. with RW 150' without requiring a seated rest period to demo incr. endurance.   Baseline 59' on 12-05-15 with RW   Time 4   Period Weeks   Status New   PT SHORT TERM GOAL #3   Title Transfer sit to stand from wheelchair with mod assist   (01-04-16)   PT SHORT TERM GOAL #4   Title Improve TUG score by at least 5 secs to demo improved functional mobiltiy.  (01-04-16)   Baseline TUG TBA   Time 4   Period Weeks   Status New           PT Long Term Goals - 12/08/15 1333    PT LONG TERM GOAL #1   Title Pt. will ambulate 240'  nonstop with RW to demo incr. endurance with CGA  (02-04-16)   Time 8   Period Weeks   Status New   PT LONG TERM GOAL #2   Title Report ability to stand at least 10" at home for improved standing tolerance  (02-04-16)  Time 8   Period Weeks   Status New   PT LONG TERM GOAL #3   Title Perform sit to stand transfer from mat to RW with +1 min  assist. (02-04-16)   Baseline mod to max assist needed from wheelchair to RW   Time 8   Period Weeks   Status New   PT LONG TERM GOAL #4   Title Perform at least 15" on Nustep to demo improved activity tolerance/endurance  (02-04-16)   Time 8   Period Weeks   Status New   PT LONG TERM GOAL #5   Title Independent to direct assistance prn with stretches and exercises for HEP.  (02-04-16)               Plan - 12/08/15 1316    Clinical Impression Statement Pt is a 75 year old lady with a 5 year decline in functional mobility, gait, balance, and coordination.  Pt was initially diagnosed with Parkinsonism approx. 5 years ago and diagnosed with MSA in May 2014 at [facility].  Comorbidities include CAD, osteoporosis, asthma, familial tremor, and neurogenic bladder.  Clinical presentation is evolving as MSA is degenerative neurological disease process.  Evaluation included gait, balance, strength, and coordination assessments. Moderate complexity decision making required for POC.                                                                                                                                                                                                                                                           Pt will benefit from skilled therapeutic intervention in order to improve on the following deficits Abnormal gait;Decreased coordination;Impaired flexibility;Difficulty walking;Decreased balance;Increased muscle spasms;Impaired tone;Decreased mobility;Decreased strength;Decreased activity tolerance;Decreased range of motion   Rehab  Potential Good   Clinical Impairments Affecting Rehab Potential severity of deficits and progressive neurological disease   PT Frequency 2x / week   PT Duration 8 weeks   PT Treatment/Interventions ADLs/Self Care Home Management;Therapeutic activities;Patient/family education;Therapeutic exercise;Gait training;Balance training;Stair training;Neuromuscular re-education;Functional mobility training   PT Next Visit Plan Do TUG; bed mobility; gait training, transfer training   PT Home Exercise Plan stretches and supine strengthening exs.   Consulted and Agree with Plan of Care Patient         Problem List Patient Active Problem List  Diagnosis Date Noted  . Neurogenic bladder   . Chronic cystitis 04/18/2015  . Pedal edema 04/01/2015  . Dyspepsia 01/09/2015  . Constipation 12/14/2014  . Medicare annual wellness visit, subsequent 11/27/2014  . Advanced care planning/counseling discussion 11/27/2014  . Osteoporosis   . B-complex deficiency 06/07/2014  . Recurrent falls 02/06/2014  . Multiple system atrophy ([place])   . Dysphagia, unspecified(787.20) 07/28/2013  . Lower extremity edema 09/26/2012  . Shoulder pain, left 07/20/2012  . 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 Routine health maintenance 08/26/2011  . Murmur 05/27/2011  . HYPERCHOLESTEROLEMIA  IIA 05/14/2010  . VENOUS INSUFFICIENCY, LEGS 06/21/2009  . CORONARY HEART DISEASE 06/09/2008  . Hypothyroidism 05/15/2008  . ASTHMA 05/15/2008  . Depression 11/07/2007  . 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 GERD 11/07/2007  . VAGINITIS, ATROPHIC 11/07/2007    Alda Lea, PT 12/08/2015, 1:43 PM  [facility] [address] [place], [place], [address] Phone: [phone]   Fax:  [phone]  Name: [name] [name] MRN: [id number] Date of Birth: Feb 21, 1941

## 2015-12-13 ENCOUNTER — Encounter: Payer: Self-pay | Admitting: *Deleted

## 2015-12-16 ENCOUNTER — Ambulatory Visit: Payer: Medicare Other | Attending: Family Medicine | Admitting: Physical Therapy

## 2015-12-16 DIAGNOSIS — R29898 Other symptoms and signs involving the musculoskeletal system: Secondary | ICD-10-CM | POA: Diagnosis not present

## 2015-12-16 DIAGNOSIS — M6289 Other specified disorders of muscle: Secondary | ICD-10-CM | POA: Diagnosis not present

## 2015-12-16 DIAGNOSIS — R269 Unspecified abnormalities of gait and mobility: Secondary | ICD-10-CM | POA: Diagnosis not present

## 2015-12-16 DIAGNOSIS — M439 Deforming dorsopathy, unspecified: Secondary | ICD-10-CM | POA: Diagnosis not present

## 2015-12-16 DIAGNOSIS — R279 Unspecified lack of coordination: Secondary | ICD-10-CM | POA: Diagnosis not present

## 2015-12-18 NOTE — Therapy (Signed)
[facility] [address] [place], [place], [address] Phone: [phone]   Fax:  [phone]  Physical Therapy Treatment  Patient Details  Name: [name] [name] MRN: [id number] Date of Birth: 01/07/1941 Referring Provider: Dr. Star Age  Encounter Date: 12/16/2015      PT End of Session - 12/18/15 1834    Visit Number 2   Number of Visits 17   Date for PT Re-Evaluation 02/04/16   Authorization Type [employer] [facility]   Authorization Time Period 12-05-15 - 02-04-16   PT Start Time 1402   PT Stop Time 1446   PT Time Calculation (min) 44 min   Equipment Utilized During Treatment Gait belt      Past Medical History  Diagnosis Date  . GERD (gastroesophageal reflux disease)     s/p nissen  . Familial tremor     followed by Dr. Erling Cruz  . Unspecified chronic bronchitis ([place])   . Neurogenic bladder     Tannenbaum/MacDiarmid  . History of pneumonia   . Hypothyroidism   . Depression   . Asthma   . CAD (coronary artery disease)     myoview 5/08:  EF 76%, no scar, no ischemia, +ECG changes with exercise;  cath 04/08/07:  pLAD 30%, mLAD 60-70% - med tx.  Marland Kitchen HLD (hyperlipidemia)   . Multiple system atrophy [facility])     [name]  . VAGINITIS, ATROPHIC 11/07/2007  . Osteoporosis 2016    DEXA T-4.4 spine deteriorated since 2012  . Chronic cystitis     recurrent UTIs, started bactrim ppx (McDiarmid)  . Internal hemorrhoids   . Hydronephrosis, bilateral     Past Surgical History  Procedure Laterality Date  . Laparoscopic nissen fundoplication    . Cystectomy    . Tumor removal    . Tubal ligation    . Colonoscopy N/A 08/29/2013    int hemm; Inda Castle, MD  . Dexa  12/2014    T -4.4 spine  . Esophagogastroduodenoscopy N/A 01/09/2015    Procedure: ESOPHAGOGASTRODUODENOSCOPY (EGD);  Surgeon: Inda Castle, MD;  Location: [facility];  Service: Endoscopy;  Laterality: N/A;  help with transfers    There were no  vitals filed for this visit.  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 at [facility] clinic in Aug. 2013;Pt. had UTI on 04-11-15; went to ED - received catherization; states she has had increased weakness and some mild decline in mobility since this problem   Patient Stated Goals improve walking and mobility   Currently in Pain? No/denies                         [facility] Adult PT Treatment/Exercise - 12/18/15 0001    Transfers   Transfers Sit to Stand;Stand to Sit   Sit to Stand 3: Mod assist   Sit to Stand Details Other (comment)   Stand to Sit 3: Mod assist   Ambulation/Gait   Ambulation/Gait Yes   Ambulation/Gait Assistance 4: Min assist  +1 assist for following with wheelchair; mod assist with LOB   Ambulation Distance (Feet) 120 Feet  mod to max assist needed with fatigue   Assistive device Rolling walker   Gait Pattern Wide base of support;Decreased hip/knee flexion - right;Decreased hip/knee flexion - left;Decreased stride length;Step-through pattern;Abducted- right;Decreased trunk rotation   Ambulation Surface Level;Indoor   Knee/Hip Exercises: Aerobic   Stationary Bike SciFit level 1.5 x 10" with UE's and LE's           PWR [facility]) - 12/18/15 1833    PWR! Up 10 reps   PWR! Twist 10 reps    Lateral trunk stretching in seated position - leaning on forearm and reaching laterally overhead with opposite arm  30 secs hold x 2 reps Seated position - trunk rotated to each side - both hands on mat with as much trunk rotation as possible for stretching -  30 sec hold x 2 reps           PT Short Term Goals - 12/08/15 1329     PT SHORT TERM GOAL #1   Title Pt. will increase standing tolerance so she is able to report standing 5" at home with S for independence with ADL's. (01-04-16)   Time 4   Period Weeks   Status New   PT SHORT TERM GOAL #2   Title Amb. with RW 150' without requiring a seated rest period to demo incr. endurance.   Baseline 79' on 12-05-15 with RW   Time 4   Period Weeks   Status New   PT SHORT TERM GOAL #3   Title Transfer sit to stand from wheelchair with mod assist   (01-04-16)   PT SHORT TERM GOAL #4   Title Improve TUG score by at least 5 secs to demo improved functional mobiltiy.  (01-04-16)   Baseline TUG TBA   Time 4   Period Weeks   Status New           PT Long Term Goals - 12/08/15 1333    PT LONG TERM GOAL #1   Title Pt. will ambulate 240' nonstop with RW to demo incr. endurance with CGA  (02-04-16)   Time 8   Period Weeks   Status New   PT LONG TERM GOAL #2   Title Report ability to stand at least 10" at home for improved standing tolerance  (02-04-16)   Time 8   Period Weeks   Status New   PT LONG TERM GOAL #3   Title Perform sit to stand transfer from mat to RW with +1 min  assist. (02-04-16)   Baseline mod to max assist needed from wheelchair to RW   Time 8   Period Weeks   Status New   PT LONG TERM GOAL #4   Title Perform at least 15" on Nustep to demo improved activity tolerance/endurance  (02-04-16)   Time 8   Period Weeks   Status New   PT LONG TERM GOAL #5   Title Independent to direct assistance prn with stretches and exercises for HEP.  (02-04-16)               Plan - 12/18/15 1835    Clinical Impression Statement Pt able to improve posture and minimize deviations with tactile cues; decreased coordination and dyskinesia persists in RLE with some involuntary movements now noted in LLE   Pt will benefit from skilled therapeutic intervention in order to improve on the following deficits Abnormal gait;Decreased coordination;Impaired  flexibility;Difficulty walking;Decreased balance;Increased muscle spasms;Impaired tone;Decreased mobility;Decreased strength;Decreased activity tolerance;Decreased range of motion   Rehab  Potential Good   Clinical Impairments Affecting Rehab Potential severity of deficits and progressive neurological disease   PT Frequency 2x / week   PT Duration 8 weeks   PT Treatment/Interventions ADLs/Self Care Home Management;Therapeutic activities;Patient/family education;Therapeutic exercise;Gait training;Balance training;Stair training;Neuromuscular re-education;Functional mobility training   PT Next Visit Plan Do TUG; bed mobility; gait training, transfer training   PT Home Exercise Plan stretches and supine strengthening exs.   Consulted and Agree with Plan of Care Patient        Problem List Patient Active Problem List   Diagnosis Date Noted  . Neurogenic bladder   . Chronic cystitis 04/18/2015  . Pedal edema 04/01/2015  . Dyspepsia 01/09/2015  . Constipation 12/14/2014  . Medicare annual wellness visit, subsequent 11/27/2014  . Advanced care planning/counseling discussion 11/27/2014  . Osteoporosis   . B-complex deficiency 06/07/2014  . Recurrent falls 02/06/2014  . Multiple system atrophy ([place])   . Dysphagia, unspecified(787.20) 07/28/2013  . Lower extremity edema 09/26/2012  . Shoulder pain, left 07/20/2012  . 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 Routine health maintenance 08/26/2011  . Murmur 05/27/2011  . HYPERCHOLESTEROLEMIA  IIA 05/14/2010  . VENOUS INSUFFICIENCY, LEGS 06/21/2009  . CORONARY HEART DISEASE 06/09/2008  . Hypothyroidism 05/15/2008  . ASTHMA 05/15/2008  . Depression 11/07/2007  . 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 GERD 11/07/2007  . VAGINITIS, ATROPHIC 11/07/2007    Alda Lea, PT 12/18/2015, 6:38 PM  [facility] [address] [place], [place], [address] Phone: [phone]   Fax:   [phone]  Name: [name] [name] MRN: [id number] Date of Birth: 04-25-1941

## 2015-12-23 ENCOUNTER — Encounter: Payer: Self-pay | Admitting: Internal Medicine

## 2015-12-23 ENCOUNTER — Ambulatory Visit (INDEPENDENT_AMBULATORY_CARE_PROVIDER_SITE_OTHER): Payer: Medicare Other | Admitting: Internal Medicine

## 2015-12-23 VITALS — BP 142/70 | HR 80

## 2015-12-23 DIAGNOSIS — K589 Irritable bowel syndrome without diarrhea: Secondary | ICD-10-CM | POA: Diagnosis not present

## 2015-12-23 DIAGNOSIS — K219 Gastro-esophageal reflux disease without esophagitis: Secondary | ICD-10-CM

## 2015-12-23 MED ORDER — FAMOTIDINE 40 MG PO TABS
ORAL_TABLET | ORAL | Status: DC
Start: 2015-12-23 — End: 2016-04-10

## 2015-12-23 MED ORDER — HYOSCYAMINE SULFATE 0.125 MG SL SUBL: SUBLINGUAL_TABLET | SUBLINGUAL | Status: DC

## 2015-12-23 NOTE — Progress Notes (Signed)
Subjective:    Patient ID: [name] [name], female    DOB: 03-22-1941, 75 y.o.   MRN: [id number]  HPI [name] [name] is a 75 year old female previously managed by Dr. Deatra Ina for IBS with abdominal spasm, GERD with history of antireflux surgery, history of constipation who is seen in follow-up. She last saw Dr. Deatra Ina in September 2016 and this is her first visit with me. She's here today with her husband. 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  She reports that she has been feeling better from a GI standpoint. She is using famotidine 40 mg twice a day which has controlled her reflux and dyspeptic symptoms. She is also using Levsin 0.125 mg sublingual 1-2 tablets every 4-6 hours as needed. Several months ago she was using this 2-3 times a day for crampy abdominal pain but this has improved significantly. She's using it now as needed which is less than once daily. She is using MiraLAX rarely for constipation and bowel movements recently have been regular with a high-fiber diet. She denies dysphagia or odynophagia.  EGD -- 01/09/2015 -- normal Colonoscopy -- 08/29/2013 -- normal except for internal hemorrhoids   Review of Systems As per history of present illness, otherwise negative  Current Medications, Allergies, Past Medical History, Past Surgical History, Family History and Social History were reviewed in [employer] record.     Objective:   Physical Exam BP 142/70 mmHg  Pulse 80 Constitutional: Well-developed and well-nourished, sitting in wheelchair. No distress. HEENT: Normocephalic and atraumatic.  Conjunctivae are normal.  No scleral icterus. Neck: Neck supple. Trachea midline. Cardiovascular: Normal rate, regular rhythm and intact distal pulses. Pulmonary/chest: Effort normal and breath sounds normal. No wheezing, rales or  rhonchi. Abdominal: Soft, nontender, nondistended. Bowel sounds active throughout.  Extremities: no clubbing, cyanosis, or edema Skin: Skin is warm and dry.  Psychiatric: Normal mood and affect. Behavior is normal.  CBC    Component Value Date/Time   WBC 11.1* 04/11/2015 0419   RBC 4.69 04/11/2015 0419   HGB 13.3 04/11/2015 0419   HCT 40.0 04/11/2015 0419   PLT 267 04/11/2015 0419   MCV 85.3 04/11/2015 0419   MCH 28.4 04/11/2015 0419   MCHC 33.3 04/11/2015 0419   RDW 13.5 04/11/2015 0419   LYMPHSABS 2.1 04/11/2015 0419   MONOABS 1.1* 04/11/2015 0419   EOSABS 0.1 04/11/2015 0419   BASOSABS 0.0 04/11/2015 0419    CMP     Component Value Date/Time   NA 140 08/30/2015 1408   K 4.0 08/30/2015 1408   CL 105 08/30/2015 1408   CO2 28 08/30/2015 1408   GLUCOSE 116* 08/30/2015 1408   BUN 12 08/30/2015 1408   CREATININE 0.76 08/30/2015 1408   CALCIUM 9.8 08/30/2015 1408   PROT 6.6 11/20/2014 1301   ALBUMIN 3.9 11/20/2014 1301   AST 20 11/20/2014 1301   ALT 18 11/20/2014 1301   ALKPHOS 113 11/20/2014 1301   BILITOT 0.8 11/20/2014 1301   GFRNONAA >60 04/11/2015 0419   GFRAA >60 04/11/2015 0419      Assessment & Plan:   75 year old female previously managed by Dr. Deatra Ina for IBS with abdominal spasm, GERD with history of antireflux surgery, history of constipation who is seen in follow-up.   1. IBS -- crampy intermittent abdominal pain treated well with Levsin. Continue Levsin 0.125 mg 1-2 tablets every 6 hours on an as-needed basis.  This problem has improved recently.  2. Constipation -- less of an issue with high-fiber diet. Can use as needed MiraLAX 17 g daily  3. GERD -- well-controlled on famotidine. Continue famotidine twice a day  One year follow-up, sooner if necessary

## 2015-12-23 NOTE — Patient Instructions (Signed)
We have sent the following medications to your pharmacy for you to pick up at your convenience: [facility]  Please take Miralax as needed for constipation.  Follow up with Dr Hilarie Fredrickson in 1 year.

## 2015-12-24 ENCOUNTER — Ambulatory Visit: Payer: Medicare Other | Admitting: Physical Therapy

## 2015-12-24 DIAGNOSIS — M6289 Other specified disorders of muscle: Secondary | ICD-10-CM

## 2015-12-24 DIAGNOSIS — M439 Deforming dorsopathy, unspecified: Secondary | ICD-10-CM

## 2015-12-24 DIAGNOSIS — R29898 Other symptoms and signs involving the musculoskeletal system: Secondary | ICD-10-CM | POA: Diagnosis not present

## 2015-12-24 DIAGNOSIS — R279 Unspecified lack of coordination: Secondary | ICD-10-CM

## 2015-12-24 DIAGNOSIS — R269 Unspecified abnormalities of gait and mobility: Secondary | ICD-10-CM | POA: Diagnosis not present

## 2015-12-25 NOTE — Therapy (Signed)
[facility] [address] [place], [place], [address] Phone: [phone]   Fax:  [phone]  Physical Therapy Treatment  Patient Details  Name: [name] [name] MRN: [id number] Date of Birth: [birth year] Referring Provider: Dr. Star Age  Encounter Date: 12/24/2015      PT End of Session - 12/25/15 1648    Visit Number 3   Number of Visits 17   Date for PT Re-Evaluation 02/04/16   Authorization Type UHC [facility]   Authorization Time Period 12-05-15 - 02-04-16   PT Start Time 1457   PT Stop Time 1537   PT Time Calculation (min) 40 min   Equipment Utilized During Treatment Gait belt      Past Medical History  Diagnosis Date  . GERD (gastroesophageal reflux disease)     s/p nissen  . Familial tremor     followed by Dr. Erling Cruz  . Unspecified chronic bronchitis ([place])   . Neurogenic bladder     Tannenbaum/MacDiarmid  . History of pneumonia   . Hypothyroidism   . Depression   . Asthma   . CAD (coronary artery disease)     myoview 5/08:  EF 76%, no scar, no ischemia, +ECG changes with exercise;  cath 04/08/07:  pLAD 30%, mLAD 60-70% - med tx.  Marland Kitchen HLD (hyperlipidemia)   . 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 VAGINITIS, ATROPHIC 11/07/2007  . Osteoporosis 2016    DEXA T-4.4 spine deteriorated since 2012  . Chronic cystitis     recurrent UTIs, started bactrim ppx (McDiarmid)  . Internal hemorrhoids   . Hydronephrosis, bilateral     Past Surgical History  Procedure Laterality Date  . Laparoscopic nissen fundoplication    . Cystectomy    . Tumor removal    . Tubal ligation    . Colonoscopy N/A 08/29/2013    int hemm; Inda Castle, MD  . Dexa  12/2014    T -4.4 spine  . Esophagogastroduodenoscopy N/A 01/09/2015    Procedure: ESOPHAGOGASTRODUODENOSCOPY (EGD);  Surgeon: Inda Castle, MD;  Location: [facility];  Service: Endoscopy;  Laterality: N/A;  help with transfers    There were no  vitals filed for this visit.  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 at [facility] clinic in Aug. 2013;Pt. had UTI on 04-11-15; went to ED - received catherization; states she has had increased weakness and some mild decline in mobility since this problem   Patient Stated Goals improve walking and mobility   Currently in Pain? No/denies                         [facility] Adult PT Treatment/Exercise - 12/25/15 0001    Transfers   Sit to Stand 3: Mod assist   Sit to Stand Details Other (comment)  pt unable to scoot forward in wheelchair independently   Ambulation/Gait   Ambulation/Gait Yes   Ambulation/Gait Assistance 4: Min assist  +1 assist for following with wheelchair; mod assist with LOB   Ambulation Distance (Feet) 200 Feet   Assistive device Rolling walker  Gait Pattern Wide base of support;Decreased hip/knee flexion - right;Decreased hip/knee flexion - left;Decreased stride length;Step-through pattern;Abducted- right;Decreased trunk rotation   Ambulation Surface Level;Indoor   Neuro Re-ed    Neuro Re-ed Details  Pt performed seated PWR! reach exercise to floor and up overhead to fasciltate anterior weight shift and trunk extension x 10 reps; also performed seated lateral trun rotation with hands meeting together on either side 10 reps to R and L as able                  PT Short Term Goals - 12/08/15 1329    PT SHORT TERM GOAL #1   Title Pt. will increase standing tolerance so she is able to report standing 5" at home with S for independence with ADL's. (01-04-16)   Time 4   Period Weeks   Status New   PT SHORT TERM GOAL #2    Title Amb. with RW 150' without requiring a seated rest period to demo incr. endurance.   Baseline 13' on 12-05-15 with RW   Time 4   Period Weeks   Status New   PT SHORT TERM GOAL #3   Title Transfer sit to stand from wheelchair with mod assist   (01-04-16)   PT SHORT TERM GOAL #4   Title Improve TUG score by at least 5 secs to demo improved functional mobiltiy.  (01-04-16)   Baseline TUG TBA   Time 4   Period Weeks   Status New           PT Long Term Goals - 12/08/15 1333    PT LONG TERM GOAL #1   Title Pt. will ambulate 240' nonstop with RW to demo incr. endurance with CGA  (02-04-16)   Time 8   Period Weeks   Status New   PT LONG TERM GOAL #2   Title Report ability to stand at least 10" at home for improved standing tolerance  (02-04-16)   Time 8   Period Weeks   Status New   PT LONG TERM GOAL #3   Title Perform sit to stand transfer from mat to RW with +1 min  assist. (02-04-16)   Baseline mod to max assist needed from wheelchair to RW   Time 8   Period Weeks   Status New   PT LONG TERM GOAL #4   Title Perform at least 15" on Nustep to demo improved activity tolerance/endurance  (02-04-16)   Time 8   Period Weeks   Status New   PT LONG TERM GOAL #5   Title Independent to direct assistance prn with stretches and exercises for HEP.  (02-04-16)               Plan - 12/25/15 1649    Clinical Impression Statement Pt had more difficulty with amb. and mobility today; increased dykinesia noted in RLE and more difficulty moving LLE; postural devaitions persist with pt leaning toward L side and holds head laterally flexed to L    Pt will benefit from skilled therapeutic intervention in order to improve on the following deficits Abnormal gait;Decreased coordination;Impaired flexibility;Difficulty walking;Decreased balance;Increased muscle spasms;Impaired tone;Decreased mobility;Decreased strength;Decreased activity tolerance;Decreased range of motion   Rehab Potential Good    Clinical Impairments Affecting Rehab Potential severity of deficits and progressive neurological disease   PT Frequency 2x / week   PT Duration 8 weeks   PT Treatment/Interventions ADLs/Self Care Home Management;Therapeutic activities;Patient/family education;Therapeutic exercise;Gait training;Balance training;Stair training;Neuromuscular re-education;Functional mobility training  PT Next Visit Plan cont ther ex, gait training   PT Home Exercise Plan stretches and supine strengthening exs.   Consulted and Agree with Plan of Care Patient        Problem List Patient Active Problem List   Diagnosis Date Noted  . Neurogenic bladder   . Chronic cystitis 04/18/2015  . Pedal edema 04/01/2015  . Dyspepsia 01/09/2015  . Constipation 12/14/2014  . Medicare annual wellness visit, subsequent 11/27/2014  . Advanced care planning/counseling discussion 11/27/2014  . Osteoporosis   . B-complex deficiency 06/07/2014  . Recurrent falls 02/06/2014  . Multiple system atrophy ([place])   . Dysphagia, unspecified(787.20) 07/28/2013  . Lower extremity edema 09/26/2012  . Shoulder pain, left 07/20/2012  . 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 Routine health maintenance 08/26/2011  . Murmur 05/27/2011  . HYPERCHOLESTEROLEMIA  IIA 05/14/2010  . VENOUS INSUFFICIENCY, LEGS 06/21/2009  . CORONARY HEART DISEASE 06/09/2008  . Hypothyroidism 05/15/2008  . ASTHMA 05/15/2008  . Depression 11/07/2007  . 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 GERD 11/07/2007  . VAGINITIS, ATROPHIC 11/07/2007    Alda Lea, PT 12/25/2015, 4:57 PM  [facility] [address] [place], [place], [address] Phone: [phone]   Fax:  [phone]  Name: [name] [name] MRN: [id number] Date of Birth: 1941/10/09

## 2015-12-26 ENCOUNTER — Ambulatory Visit: Payer: Medicare Other | Admitting: Physical Therapy

## 2015-12-26 DIAGNOSIS — M6289 Other specified disorders of muscle: Secondary | ICD-10-CM

## 2015-12-26 DIAGNOSIS — R269 Unspecified abnormalities of gait and mobility: Secondary | ICD-10-CM

## 2015-12-26 DIAGNOSIS — R279 Unspecified lack of coordination: Secondary | ICD-10-CM

## 2015-12-26 DIAGNOSIS — R29898 Other symptoms and signs involving the musculoskeletal system: Secondary | ICD-10-CM

## 2015-12-26 DIAGNOSIS — M439 Deforming dorsopathy, unspecified: Secondary | ICD-10-CM | POA: Diagnosis not present

## 2015-12-29 ENCOUNTER — Encounter: Payer: Self-pay | Admitting: Physical Therapy

## 2015-12-29 NOTE — Therapy (Signed)
[facility] [address] [place], [place], [address] Phone: [phone]   Fax:  [phone]  Physical Therapy Treatment  Patient Details  Name: [name] [name] MRN: [id number] Date of Birth: Sep 05, 1941 Referring Provider: Dr. Star Age  Encounter Date: 12/26/2015      PT End of Session - 12/29/15 1614    Visit Number 4   Number of Visits 17   Date for PT Re-Evaluation 02/04/16   Authorization Type UHC [facility]   Authorization Time Period 12-05-15 - 02-04-16   PT Start Time 1410   PT Stop Time 1512   PT Time Calculation (min) 62 min   Equipment Utilized During Treatment Gait belt      Past Medical History  Diagnosis Date  . GERD (gastroesophageal reflux disease)     s/p nissen  . Familial tremor     followed by Dr. Erling Cruz  . Unspecified chronic bronchitis ([place])   . Neurogenic bladder     Tannenbaum/MacDiarmid  . History of pneumonia   . Hypothyroidism   . Depression   . Asthma   . CAD (coronary artery disease)     myoview 5/08:  EF 76%, no scar, no ischemia, +ECG changes with exercise;  cath 04/08/07:  pLAD 30%, mLAD 60-70% - med tx.  Marland Kitchen HLD (hyperlipidemia)   . 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 VAGINITIS, ATROPHIC 11/07/2007  . Osteoporosis 2016    DEXA T-4.4 spine deteriorated since 2012  . Chronic cystitis     recurrent UTIs, started bactrim ppx (McDiarmid)  . Internal hemorrhoids   . Hydronephrosis, bilateral     Past Surgical History  Procedure Laterality Date  . Laparoscopic nissen fundoplication    . Cystectomy    . Tumor removal    . Tubal ligation    . Colonoscopy N/A 08/29/2013    int hemm; Inda Castle, MD  . Dexa  12/2014    T -4.4 spine  . Esophagogastroduodenoscopy N/A 01/09/2015    Procedure: ESOPHAGOGASTRODUODENOSCOPY (EGD);  Surgeon: Inda Castle, MD;  Location: [facility];  Service: Endoscopy;  Laterality: N/A;  help with transfers    There were no  vitals filed for this visit.  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 at [facility] clinic in Aug. 2013;Pt. had UTI on 04-11-15; went to ED - received catherization; states she has had increased weakness and some mild decline in mobility since this problem   Patient Stated Goals improve walking and mobility   Currently in Pain? No/denies                         [facility] Adult PT Treatment/Exercise - 12/29/15 0001    Transfers   Transfers Sit to Stand;Stand to Sit   Sit to Stand 4: Min guard  from high/low mat table   Stand to Sit 4: Min guard   Comments mod assist to scoot forward on mat   Ambulation/Gait   Ambulation/Gait Yes   Ambulation/Gait Assistance 4: Min assist  +1 assist for following with wheelchair; mod assist with LOB   Ambulation Distance (Feet) 120 Feet   Assistive  device Rolling walker   Gait Pattern Wide base of support;Decreased hip/knee flexion - right;Decreased hip/knee flexion - left;Decreased stride length;Step-through pattern;Abducted- right;Decreased trunk rotation   Ambulation Surface Level;Indoor   Lumbar Exercises: Stretches   Lower Trunk Rotation 2 reps;30 seconds  in seated position   Knee/Hip Exercises: Aerobic   Stationary Bike SciFit level 1.5 x 15" with UE's and LE's           [facility]) - 12/29/15 1612    PWR! Up 10 reps   PWR! Twist 10 reps   PWR! Step 10 reps     Stretches for R L lateral trunk in seated position - leaning on each forearm and reaching overhead with opposite UE - 30 sec hold x 2 reps each side Trunk rotation in seated position - both hands on mat on R side 30 sec hold and then on L side 30 sec hold x 1 rep each side  NeuroRe-ed:  Seated  marching x 10 reps each; alternate knee extension in seated position x 10 reps each        PT Short Term Goals - 12/08/15 1329    PT SHORT TERM GOAL #1   Title Pt. will increase standing tolerance so she is able to report standing 5" at home with S for independence with ADL's. (01-04-16)   Time 4   Period Weeks   Status New   PT SHORT TERM GOAL #2   Title Amb. with RW 150' without requiring a seated rest period to demo incr. endurance.   Baseline 67' on 12-05-15 with RW   Time 4   Period Weeks   Status New   PT SHORT TERM GOAL #3   Title Transfer sit to stand from wheelchair with mod assist   (01-04-16)   PT SHORT TERM GOAL #4   Title Improve TUG score by at least 5 secs to demo improved functional mobiltiy.  (01-04-16)   Baseline TUG TBA   Time 4   Period Weeks   Status New           PT Long Term Goals - 12/08/15 1333    PT LONG TERM GOAL #1   Title Pt. will ambulate 240' nonstop with RW to demo incr. endurance with CGA  (02-04-16)   Time 8   Period Weeks   Status New   PT LONG TERM GOAL #2   Title Report ability to stand at least 10" at home for improved standing tolerance  (02-04-16)   Time 8   Period Weeks   Status New   PT LONG TERM GOAL #3   Title Perform sit to stand transfer from mat to RW with +1 min  assist. (02-04-16)   Baseline mod to max assist needed from wheelchair to RW   Time 8   Period Weeks   Status New   PT LONG TERM GOAL #4   Title Perform at least 15" on Nustep to demo improved activity tolerance/endurance  (02-04-16)   Time 8   Period Weeks   Status New   PT LONG TERM GOAL #5   Title Independent to direct assistance prn with stretches and exercises for HEP.  (02-04-16)               Plan - 12/29/15 1615    Clinical Impression Statement Pt able to perform sit to stand from mat table with CGA to min guard assist, however, needed mod assist to scoot forward on mat;  RLE more coordinated  today with pt able to keep R knee flexed and  performed marching in seated position   Pt will benefit from skilled therapeutic intervention in order to improve on the following deficits Abnormal gait;Decreased coordination;Impaired flexibility;Difficulty walking;Decreased balance;Increased muscle spasms;Impaired tone;Decreased mobility;Decreased strength;Decreased activity tolerance;Decreased range of motion   Rehab Potential Good   Clinical Impairments Affecting Rehab Potential severity of deficits and progressive neurological disease   PT Frequency 2x / week   PT Duration 8 weeks   PT Treatment/Interventions ADLs/Self Care Home Management;Therapeutic activities;Patient/family education;Therapeutic exercise;Gait training;Balance training;Stair training;Neuromuscular re-education;Functional mobility training   PT Next Visit Plan cont ther ex, gait training   PT Home Exercise Plan stretches and supine strengthening exs.   Consulted and Agree with Plan of Care Patient        Problem List Patient Active Problem List   Diagnosis Date Noted  . Neurogenic bladder   . Chronic cystitis 04/18/2015  . Pedal edema 04/01/2015  . Dyspepsia 01/09/2015  . Constipation 12/14/2014  . Medicare annual wellness visit, subsequent 11/27/2014  . Advanced care planning/counseling discussion 11/27/2014  . Osteoporosis   . B-complex deficiency 06/07/2014  . Recurrent falls 02/06/2014  . Multiple system atrophy ([place])   . Dysphagia, unspecified(787.20) 07/28/2013  . Lower extremity edema 09/26/2012  . Shoulder pain, left 07/20/2012  . 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 Routine health maintenance 08/26/2011  . Murmur 05/27/2011  . HYPERCHOLESTEROLEMIA  IIA 05/14/2010  . VENOUS INSUFFICIENCY, LEGS 06/21/2009  . CORONARY HEART DISEASE 06/09/2008  . Hypothyroidism 05/15/2008  . ASTHMA 05/15/2008  . Depression 11/07/2007  . 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 GERD 11/07/2007  . VAGINITIS, ATROPHIC 11/07/2007    Alda Lea,  PT 12/29/2015, 4:19 PM  [facility] [address] [place], [place], [address] Phone: [phone]   Fax:  [phone]  Name: [name] [name] MRN: [id number] Date of Birth: [birth year]

## 2015-12-30 ENCOUNTER — Ambulatory Visit: Payer: Medicare Other | Admitting: Physical Therapy

## 2015-12-30 DIAGNOSIS — R269 Unspecified abnormalities of gait and mobility: Secondary | ICD-10-CM

## 2015-12-30 DIAGNOSIS — M6289 Other specified disorders of muscle: Secondary | ICD-10-CM | POA: Diagnosis not present

## 2015-12-30 DIAGNOSIS — M439 Deforming dorsopathy, unspecified: Secondary | ICD-10-CM | POA: Diagnosis not present

## 2015-12-30 DIAGNOSIS — R29898 Other symptoms and signs involving the musculoskeletal system: Secondary | ICD-10-CM | POA: Diagnosis not present

## 2015-12-30 DIAGNOSIS — R279 Unspecified lack of coordination: Secondary | ICD-10-CM | POA: Diagnosis not present

## 2016-01-01 NOTE — Therapy (Signed)
[facility] [address] [place], [place], [address] Phone: [phone]   Fax:  [phone]  Physical Therapy Treatment  Patient Details  Name: [name] [name] MRN: [id number] Date of Birth: [birth year] Referring Provider: Dr. Star Age  Encounter Date: 12/30/2015      PT End of Session - 01/01/16 1843    Visit Number 5   Number of Visits 17   Date for PT Re-Evaluation 02/04/16   Authorization Type UHC [facility]   Authorization Time Period 12-05-15 - 02-04-16   PT Start Time 1401   PT Stop Time 1446   PT Time Calculation (min) 45 min   Equipment Utilized During Treatment Gait belt      Past Medical History  Diagnosis Date  . GERD (gastroesophageal reflux disease)     s/p nissen  . Familial tremor     followed by Dr. Erling Cruz  . Unspecified chronic bronchitis ([place])   . Neurogenic bladder     Tannenbaum/MacDiarmid  . History of pneumonia   . Hypothyroidism   . Depression   . Asthma   . CAD (coronary artery disease)     myoview 5/08:  EF 76%, no scar, no ischemia, +ECG changes with exercise;  cath 04/08/07:  pLAD 30%, mLAD 60-70% - med tx.  Marland Kitchen HLD (hyperlipidemia)   . 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 VAGINITIS, ATROPHIC 11/07/2007  . Osteoporosis 2016    DEXA T-4.4 spine deteriorated since 2012  . Chronic cystitis     recurrent UTIs, started bactrim ppx (McDiarmid)  . Internal hemorrhoids   . Hydronephrosis, bilateral     Past Surgical History  Procedure Laterality Date  . Laparoscopic nissen fundoplication    . Cystectomy    . Tumor removal    . Tubal ligation    . Colonoscopy N/A 08/29/2013    int hemm; Inda Castle, MD  . Dexa  12/2014    T -4.4 spine  . Esophagogastroduodenoscopy N/A 01/09/2015    Procedure: ESOPHAGOGASTRODUODENOSCOPY (EGD);  Surgeon: Inda Castle, MD;  Location: [facility];  Service: Endoscopy;  Laterality: N/A;  help with transfers    There were no  vitals filed for this visit.  Visit Diagnosis:  Abnormality of gait  Leg weakness, bilateral  Abnormal increased muscle tone      Subjective Assessment - 01/01/16 1838    Subjective Pt reports she had an extremely difficult time trying to walk at home on Sunday "I had to to take a fluid pill"; pt attributes difficulty walking to incr. 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 at [facility] clinic in Aug. 2013;Pt. had UTI on 04-11-15; went to ED - received catherization; states she has had increased weakness and some mild decline in mobility since this problem   Patient Stated Goals improve walking and mobility   Currently in Pain? No/denies                         [facility] Adult PT Treatment/Exercise - 01/01/16 0001    Ambulation/Gait   Ambulation/Gait Yes   Ambulation/Gait Assistance 4: Min assist  +1 assist for following with wheelchair; mod assist with LOB   Ambulation Distance (Feet) 120 Feet   Assistive device Rolling walker   Gait Pattern Wide base of support;Decreased hip/knee flexion - right;Decreased hip/knee flexion - left;Decreased stride length;Step-through pattern;Abducted-  right;Decreased trunk rotation   Ambulation Surface Level;Indoor   Neuro Re-ed    Neuro Re-ed Details  Pt performed seated PWR! reach exercise to floor and up overhead to fasciltate anterior weight shift and trunk extension x 10 reps; also performed seated lateral trun rotation with hands meeting together on either side 10 reps to R and L as able   Knee/Hip Exercises: Aerobic   Stationary Bike Nustep level 3 x 15" with UE's and LE's                  PT Short Term Goals - 12/08/15 1329    PT SHORT TERM GOAL #1   Title Pt. will increase standing tolerance so she is able to report standing 5" at home with S for independence with ADL's. (01-04-16)   Time 4   Period Weeks   Status New   PT SHORT TERM GOAL #2    Title Amb. with RW 150' without requiring a seated rest period to demo incr. endurance.   Baseline 36' on 12-05-15 with RW   Time 4   Period Weeks   Status New   PT SHORT TERM GOAL #3   Title Transfer sit to stand from wheelchair with mod assist   (01-04-16)   PT SHORT TERM GOAL #4   Title Improve TUG score by at least 5 secs to demo improved functional mobiltiy.  (01-04-16)   Baseline TUG TBA   Time 4   Period Weeks   Status New           PT Long Term Goals - 12/08/15 1333    PT LONG TERM GOAL #1   Title Pt. will ambulate 240' nonstop with RW to demo incr. endurance with CGA  (02-04-16)   Time 8   Period Weeks   Status New   PT LONG TERM GOAL #2   Title Report ability to stand at least 10" at home for improved standing tolerance  (02-04-16)   Time 8   Period Weeks   Status New   PT LONG TERM GOAL #3   Title Perform sit to stand transfer from mat to RW with +1 min  assist. (02-04-16)   Baseline mod to max assist needed from wheelchair to RW   Time 8   Period Weeks   Status New   PT LONG TERM GOAL #4   Title Perform at least 15" on Nustep to demo improved activity tolerance/endurance  (02-04-16)   Time 8   Period Weeks   Status New   PT LONG TERM GOAL #5   Title Independent to direct assistance prn with stretches and exercises for HEP.  (02-04-16)               Plan - 01/01/16 1843    Clinical Impression Statement Pt had more difficulty with ambulation today - more freezing episodes noted in RLE   Pt will benefit from skilled therapeutic intervention in order to improve on the following deficits Abnormal gait;Decreased coordination;Impaired flexibility;Difficulty walking;Decreased balance;Increased muscle spasms;Impaired tone;Decreased mobility;Decreased strength;Decreased activity tolerance;Decreased range of motion   Rehab Potential Good   Clinical Impairments Affecting Rehab Potential severity of deficits and progressive neurological disease   PT Frequency 2x /  week   PT Duration 8 weeks   PT Treatment/Interventions ADLs/Self Care Home Management;Therapeutic activities;Patient/family education;Therapeutic exercise;Gait training;Balance training;Stair training;Neuromuscular re-education;Functional mobility training   PT Next Visit Plan cont ther ex, gait training   PT Home Exercise Plan stretches and supine strengthening exs.  Consulted and Agree with Plan of Care Patient        Problem List Patient Active Problem List   Diagnosis Date Noted  . Neurogenic bladder   . Chronic cystitis 04/18/2015  . Pedal edema 04/01/2015  . Dyspepsia 01/09/2015  . Constipation 12/14/2014  . Medicare annual wellness visit, subsequent 11/27/2014  . Advanced care planning/counseling discussion 11/27/2014  . Osteoporosis   . B-complex deficiency 06/07/2014  . Recurrent falls 02/06/2014  . 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 Dysphagia, unspecified(787.20) 07/28/2013  . Lower extremity edema 09/26/2012  . Shoulder pain, left 07/20/2012  . 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 Routine health maintenance 08/26/2011  . Murmur 05/27/2011  . HYPERCHOLESTEROLEMIA  IIA 05/14/2010  . VENOUS INSUFFICIENCY, LEGS 06/21/2009  . CORONARY HEART DISEASE 06/09/2008  . Hypothyroidism 05/15/2008  . ASTHMA 05/15/2008  . Depression 11/07/2007  . 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 GERD 11/07/2007  . VAGINITIS, ATROPHIC 11/07/2007    Alda Lea, PT 01/01/2016, 6:46 PM  [facility] [address] [place], [place], [address] Phone: [phone]   Fax:  [phone]  Name: NASHAE MAUDLIN MRN: [id number] Date of Birth: 1941/09/26

## 2016-01-02 ENCOUNTER — Ambulatory Visit: Payer: Medicare Other | Admitting: Physical Therapy

## 2016-01-02 DIAGNOSIS — M6289 Other specified disorders of muscle: Secondary | ICD-10-CM

## 2016-01-02 DIAGNOSIS — R269 Unspecified abnormalities of gait and mobility: Secondary | ICD-10-CM | POA: Diagnosis not present

## 2016-01-02 DIAGNOSIS — R279 Unspecified lack of coordination: Secondary | ICD-10-CM | POA: Diagnosis not present

## 2016-01-02 DIAGNOSIS — R29898 Other symptoms and signs involving the musculoskeletal system: Secondary | ICD-10-CM

## 2016-01-02 DIAGNOSIS — M439 Deforming dorsopathy, unspecified: Secondary | ICD-10-CM | POA: Diagnosis not present

## 2016-01-04 NOTE — Therapy (Signed)
[facility] [address] [place], [place], [address] Phone: [phone]   Fax:  [phone]  Physical Therapy Treatment  Patient Details  Name: [name] [name] MRN: [id number] Date of Birth: 07/16/1941 Referring Provider: Dr. Star Age  Encounter Date: 01/02/2016      PT End of Session - 01/04/16 1355    Visit Number 6   Number of Visits 17   Date for PT Re-Evaluation 02/04/16   Authorization Type [employer] [facility]   Authorization Time Period 12-05-15 - 02-04-16   PT Start Time 1530   PT Stop Time 1629   PT Time Calculation (min) 59 min   Equipment Utilized During Treatment Gait belt      Past Medical History  Diagnosis Date  . GERD (gastroesophageal reflux disease)     s/p nissen  . Familial tremor     followed by Dr. Erling Cruz  . Unspecified chronic bronchitis ([place])   . Neurogenic bladder     Tannenbaum/MacDiarmid  . History of pneumonia   . Hypothyroidism   . Depression   . Asthma   . CAD (coronary artery disease)     myoview 5/08:  EF 76%, no scar, no ischemia, +ECG changes with exercise;  cath 04/08/07:  pLAD 30%, mLAD 60-70% - med tx.  Marland Kitchen HLD (hyperlipidemia)   . Multiple system atrophy [facility])     [name]  . VAGINITIS, ATROPHIC 11/07/2007  . Osteoporosis 2016    DEXA T-4.4 spine deteriorated since 2012  . Chronic cystitis     recurrent UTIs, started bactrim ppx (McDiarmid)  . Internal hemorrhoids   . Hydronephrosis, bilateral     Past Surgical History  Procedure Laterality Date  . Laparoscopic nissen fundoplication    . Cystectomy    . Tumor removal    . Tubal ligation    . Colonoscopy N/A 08/29/2013    int hemm; Inda Castle, MD  . Dexa  12/2014    T -4.4 spine  . Esophagogastroduodenoscopy N/A 01/09/2015    Procedure: ESOPHAGOGASTRODUODENOSCOPY (EGD);  Surgeon: Inda Castle, MD;  Location[facility];  Service: Endoscopy;  Laterality: N/A;  help with transfers    There were no  vitals filed for this visit.  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 at [facility] clinic in Aug. 2013;Pt. had UTI on 04-11-15; went to ED - received catherization; states she has had increased weakness and some mild decline in mobility since this problem   Patient Stated Goals improve walking and mobility   Currently in Pain? No/denies                         [facility] Adult PT Treatment/Exercise - 01/04/16 0001    Transfers   Transfers Sit to Stand;Stand to Sit   Sit to Stand 3: Mod assist   Ambulation/Gait   Ambulation/Gait Yes   Ambulation/Gait Assistance 4: Min assist  +1 assist for following with wheelchair; mod assist with LOB   Ambulation Distance (Feet) 120 Feet   Assistive device Rolling walker  Gait Pattern Wide base of support;Decreased hip/knee flexion - right;Decreased hip/knee flexion - left;Decreased stride length;Step-through pattern;Abducted- right;Decreased trunk rotation   Ambulation Surface Level;Indoor   Lumbar Exercises: Stretches   Lower Trunk Rotation 2 reps;30 seconds  in seated position   Knee/Hip Exercises: Aerobic   Stationary Bike Nustep level 3 x 15" with UE's and LE's           [facility]) - 01/04/16 1354    PWR! Up 10 reps   PWR! Twist 10 reps   PWR! Step 10 reps               PT Short Term Goals - 12/08/15 1329    PT SHORT TERM GOAL #1   Title Pt. will increase standing tolerance so she is able to report standing 5" at home with S for independence with ADL's. (01-04-16)   Time 4   Period Weeks   Status New   PT SHORT TERM GOAL #2   Title Amb. with RW 150'  without requiring a seated rest period to demo incr. endurance.   Baseline 63' on 12-05-15 with RW   Time 4   Period Weeks   Status New   PT SHORT TERM GOAL #3   Title Transfer sit to stand from wheelchair with mod assist   (01-04-16)   PT SHORT TERM GOAL #4   Title Improve TUG score by at least 5 secs to demo improved functional mobiltiy.  (01-04-16)   Baseline TUG TBA   Time 4   Period Weeks   Status New           PT Long Term Goals - 12/08/15 1333    PT LONG TERM GOAL #1   Title Pt. will ambulate 240' nonstop with RW to demo incr. endurance with CGA  (02-04-16)   Time 8   Period Weeks   Status New   PT LONG TERM GOAL #2   Title Report ability to stand at least 10" at home for improved standing tolerance  (02-04-16)   Time 8   Period Weeks   Status New   PT LONG TERM GOAL #3   Title Perform sit to stand transfer from mat to RW with +1 min  assist. (02-04-16)   Baseline mod to max assist needed from wheelchair to RW   Time 8   Period Weeks   Status New   PT LONG TERM GOAL #4   Title Perform at least 15" on Nustep to demo improved activity tolerance/endurance  (02-04-16)   Time 8   Period Weeks   Status New   PT LONG TERM GOAL #5   Title Independent to direct assistance prn with stretches and exercises for HEP.  (02-04-16)               Plan - 01/04/16 1355    Clinical Impression Statement Pt progressing slowly towards goals - incr. trunk tightness/spasticity and RLE dystonia and decr. coordination noted in R and LLE   Pt will benefit from skilled therapeutic intervention in order to improve on the following deficits Abnormal gait;Decreased coordination;Impaired flexibility;Difficulty walking;Decreased balance;Increased muscle spasms;Impaired tone;Decreased mobility;Decreased strength;Decreased activity tolerance;Decreased range of motion   Rehab Potential Good   Clinical Impairments Affecting Rehab Potential severity of deficits and progressive neurological disease    PT Frequency 2x / week   PT Duration 8 weeks   PT Treatment/Interventions ADLs/Self Care Home Management;Therapeutic activities;Patient/family education;Therapeutic exercise;Gait training;Balance training;Stair training;Neuromuscular re-education;Functional mobility training   PT Next Visit  Plan cont ther ex, gait training   PT Home Exercise Plan stretches and supine strengthening exs.   Consulted and Agree with Plan of Care Patient        Problem List Patient Active Problem List   Diagnosis Date Noted  . Neurogenic bladder   . Chronic cystitis 04/18/2015  . Pedal edema 04/01/2015  . Dyspepsia 01/09/2015  . Constipation 12/14/2014  . Medicare annual wellness visit, subsequent 11/27/2014  . Advanced care planning/counseling discussion 11/27/2014  . Osteoporosis   . B-complex deficiency 06/07/2014  . Recurrent falls 02/06/2014  . Multiple system atrophy ([place])   . Dysphagia, unspecified(787.20) 07/28/2013  . Lower extremity edema 09/26/2012  . Shoulder pain, left 07/20/2012  . 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 Routine health maintenance 08/26/2011  . Murmur 05/27/2011  . HYPERCHOLESTEROLEMIA  IIA 05/14/2010  . VENOUS INSUFFICIENCY, LEGS 06/21/2009  . CORONARY HEART DISEASE 06/09/2008  . Hypothyroidism 05/15/2008  . ASTHMA 05/15/2008  . Depression 11/07/2007  . 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 GERD 11/07/2007  . VAGINITIS, ATROPHIC 11/07/2007    Alda Lea, PT 01/04/2016, 2:00 PM  [facility] [address] [place], [place], [address] Phone: [phone]   Fax:  [phone]  Name: [name] [name] MRN: [id number] Date of Birth: 05/24/1941

## 2016-01-06 ENCOUNTER — Ambulatory Visit: Payer: Medicare Other | Admitting: Physical Therapy

## 2016-01-06 DIAGNOSIS — R279 Unspecified lack of coordination: Secondary | ICD-10-CM

## 2016-01-06 DIAGNOSIS — M6289 Other specified disorders of muscle: Secondary | ICD-10-CM | POA: Diagnosis not present

## 2016-01-06 DIAGNOSIS — R29898 Other symptoms and signs involving the musculoskeletal system: Secondary | ICD-10-CM

## 2016-01-06 DIAGNOSIS — M439 Deforming dorsopathy, unspecified: Secondary | ICD-10-CM | POA: Diagnosis not present

## 2016-01-06 DIAGNOSIS — R269 Unspecified abnormalities of gait and mobility: Secondary | ICD-10-CM

## 2016-01-08 NOTE — Therapy (Signed)
[facility] [address] [place], [place], [address] Phone: [phone]   Fax:  [phone]  Physical Therapy Treatment  Patient Details  Name: [name] [name] MRN: [id number] Date of Birth: Apr 23, 1941 Referring Provider: Dr. Star Age  Encounter Date: 01/06/2016      PT End of Session - 01/08/16 1116    Visit Number 7   Number of Visits 17   Date for PT Re-Evaluation 02/04/16   Authorization Type [employer] [facility]   Authorization Time Period 12-05-15 - 02-04-16   PT Start Time 1452   PT Stop Time 1556   PT Time Calculation (min) 64 min   Equipment Utilized During Treatment Gait belt      Past Medical History  Diagnosis Date  . GERD (gastroesophageal reflux disease)     s/p nissen  . Familial tremor     followed by Dr. Erling Cruz  . Unspecified chronic bronchitis ([place])   . Neurogenic bladder     Tannenbaum/MacDiarmid  . History of pneumonia   . Hypothyroidism   . Depression   . Asthma   . CAD (coronary artery disease)     myoview 5/08:  EF 76%, no scar, no ischemia, +ECG changes with exercise;  cath 04/08/07:  pLAD 30%, mLAD 60-70% - med tx.  Marland Kitchen HLD (hyperlipidemia)   . Multiple system atrophy [facility])     [name]  . VAGINITIS, ATROPHIC 11/07/2007  . Osteoporosis 2016    DEXA T-4.4 spine deteriorated since 2012  . Chronic cystitis     recurrent UTIs, started bactrim ppx (McDiarmid)  . Internal hemorrhoids   . Hydronephrosis, bilateral     Past Surgical History  Procedure Laterality Date  . Laparoscopic nissen fundoplication    . Cystectomy    . Tumor removal    . Tubal ligation    . Colonoscopy N/A 08/29/2013    int hemm; Inda Castle, MD  . Dexa  12/2014    T -4.4 spine  . Esophagogastroduodenoscopy N/A 01/09/2015    Procedure: ESOPHAGOGASTRODUODENOSCOPY (EGD);  Surgeon: Inda Castle, MD;  Location: [facility];  Service: Endoscopy;  Laterality: N/A;  help with transfers    There were no  vitals filed for this visit.  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 at [facility] clinic in Aug. 2013;Pt. had UTI on 04-11-15; went to ED - received catherization; states she has had increased weakness and some mild decline in mobility since this problem   Patient Stated Goals improve walking and mobility   Currently in Pain? No/denies                         [facility] Adult PT Treatment/Exercise - 01/08/16 0001    Transfers   Transfers Sit to Stand;Stand to Sit   Sit to Stand 3: Mod assist   Stand to Sit 3: Mod assist   Ambulation/Gait   Ambulation/Gait Yes   Ambulation/Gait Assistance 4: Min assist  +1 assist for following with wheelchair; mod assist with LOB   Ambulation Distance (Feet) 120 Feet   Assistive device  Rolling walker   Gait Pattern Wide base of support;Decreased hip/knee flexion - right;Decreased hip/knee flexion - left;Decreased stride length;Step-through pattern;Abducted- right;Decreased trunk rotation   Ambulation Surface Level;Indoor   Neuro Re-ed    Neuro Re-ed Details  Pt performed seated PWR! reach exercise to floor and up overhead to fasciltate anterior weight shift and trunk extension x 10 reps; also performed seated lateral trun rotation with hands meeting together on either side 10 reps to R and L as able   Lumbar Exercises: Stretches   Lower Trunk Rotation 2 reps;30 seconds  in seated position   Knee/Hip Exercises: Aerobic   Stationary Bike Nustep level 3 x 15" with UE's and LE's           [facility]) - 01/08/16 1114    PWR! Up 10 reps   PWR! Twist 10 reps   PWR! Step 10 reps   Comments  big stepping out/in with each leg x 10 reps eahc with mod assist               PT Short Term Goals - 12/08/15 1329    PT SHORT TERM GOAL #1   Title Pt. will increase standing tolerance so she is able to report standing 5" at home with S for independence with ADL's. (01-04-16)   Time 4   Period Weeks   Status New   PT SHORT TERM GOAL #2   Title Amb. with RW 150' without requiring a seated rest period to demo incr. endurance.   Baseline 52' on 12-05-15 with RW   Time 4   Period Weeks   Status New   PT SHORT TERM GOAL #3   Title Transfer sit to stand from wheelchair with mod assist   (01-04-16)   PT SHORT TERM GOAL #4   Title Improve TUG score by at least 5 secs to demo improved functional mobiltiy.  (01-04-16)   Baseline TUG TBA   Time 4   Period Weeks   Status New           PT Long Term Goals - 12/08/15 1333    PT LONG TERM GOAL #1   Title Pt. will ambulate 240' nonstop with RW to demo incr. endurance with CGA  (02-04-16)   Time 8   Period Weeks   Status New   PT LONG TERM GOAL #2   Title Report ability to stand at least 10" at home for improved standing tolerance  (02-04-16)   Time 8   Period Weeks   Status New   PT LONG TERM GOAL #3   Title Perform sit to stand transfer from mat to RW with +1 min  assist. (02-04-16)   Baseline mod to max assist needed from wheelchair to RW   Time 8   Period Weeks   Status New   PT LONG TERM GOAL #4   Title Perform at least 15" on Nustep to demo improved activity tolerance/endurance  (02-04-16)   Time 8   Period Weeks   Status New   PT LONG TERM GOAL #5   Title Independent to direct assistance prn with stretches and exercises for HEP.  (02-04-16)               Plan - 01/08/16 1117    Clinical Impression Statement Pt began having gait difficuty with incr. freezing episodes of RLE after approx. 61' amb. distance;  LLE also exhibiting some dyskinesia with incr. knee extension movements noted in RLE today   Pt  will benefit  from skilled therapeutic intervention in order to improve on the following deficits Abnormal gait;Decreased coordination;Impaired flexibility;Difficulty walking;Decreased balance;Increased muscle spasms;Impaired tone;Decreased mobility;Decreased strength;Decreased activity tolerance;Decreased range of motion   Rehab Potential Good   Clinical Impairments Affecting Rehab Potential severity of deficits and progressive neurological disease   PT Frequency 2x / week   PT Duration 8 weeks   PT Treatment/Interventions ADLs/Self Care Home Management;Therapeutic activities;Patient/family education;Therapeutic exercise;Gait training;Balance training;Stair training;Neuromuscular re-education;Functional mobility training   PT Next Visit Plan cont ther ex, gait training   PT Home Exercise Plan stretches and supine strengthening exs.   Consulted and Agree with Plan of Care Patient        Problem List Patient Active Problem List   Diagnosis Date Noted  . Neurogenic bladder   . Chronic cystitis 04/18/2015  . Pedal edema 04/01/2015  . Dyspepsia 01/09/2015  . Constipation 12/14/2014  . Medicare annual wellness visit, subsequent 11/27/2014  . Advanced care planning/counseling discussion 11/27/2014  . Osteoporosis   . B-complex deficiency 06/07/2014  . Recurrent falls 02/06/2014  . Multiple system atrophy ([place])   . Dysphagia, unspecified(787.20) 07/28/2013  . Lower extremity edema 09/26/2012  . Shoulder pain, left 07/20/2012  . 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 Routine health maintenance 08/26/2011  . Murmur 05/27/2011  . HYPERCHOLESTEROLEMIA  IIA 05/14/2010  . VENOUS INSUFFICIENCY, LEGS 06/21/2009  . CORONARY HEART DISEASE 06/09/2008  . Hypothyroidism 05/15/2008  . ASTHMA 05/15/2008  . Depression 11/07/2007  . 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 GERD 11/07/2007  . VAGINITIS, ATROPHIC 11/07/2007    Alda Lea, PT 01/08/2016, 11:20 AM  [facility] [address] [place], [place], [address] Phone: [phone]   Fax:  [phone]  Name: YTZEL GUBLER MRN: [id number] Date of Birth: June 27, 1941

## 2016-01-09 ENCOUNTER — Ambulatory Visit: Payer: Medicare Other | Attending: Family Medicine | Admitting: Physical Therapy

## 2016-01-09 DIAGNOSIS — R270 Ataxia, unspecified: Secondary | ICD-10-CM | POA: Diagnosis not present

## 2016-01-09 DIAGNOSIS — R29898 Other symptoms and signs involving the musculoskeletal system: Secondary | ICD-10-CM

## 2016-01-09 DIAGNOSIS — R278 Other lack of coordination: Secondary | ICD-10-CM | POA: Insufficient documentation

## 2016-01-09 DIAGNOSIS — M6289 Other specified disorders of muscle: Secondary | ICD-10-CM

## 2016-01-09 DIAGNOSIS — R269 Unspecified abnormalities of gait and mobility: Secondary | ICD-10-CM | POA: Diagnosis not present

## 2016-01-09 DIAGNOSIS — R279 Unspecified lack of coordination: Secondary | ICD-10-CM | POA: Diagnosis not present

## 2016-01-09 DIAGNOSIS — R2689 Other abnormalities of gait and mobility: Secondary | ICD-10-CM | POA: Insufficient documentation

## 2016-01-10 NOTE — Therapy (Signed)
[facility] [address] [place], [place], [address] Phone: [phone]   Fax:  [phone]  Physical Therapy Treatment  Patient Details  Name: [name] [name] MRN: [id number] Date of Birth: 1941-02-19 Referring Provider: Dr. Star Age  Encounter Date: 01/09/2016      PT End of Session - 01/10/16 1036    Visit Number 8   Number of Visits 17   Date for PT Re-Evaluation 02/04/16   Authorization Type [facility] [facility]   Authorization Time Period 12-05-15 - 02-04-16   PT Start Time 1401   PT Stop Time 1502   PT Time Calculation (min) 61 min   Equipment Utilized During Treatment Gait belt      Past Medical History  Diagnosis Date  . GERD (gastroesophageal reflux disease)     s/p nissen  . Familial tremor     followed by Dr. Erling Cruz  . Unspecified chronic bronchitis ([place])   . Neurogenic bladder     Tannenbaum/MacDiarmid  . History of pneumonia   . Hypothyroidism   . Depression   . Asthma   . CAD (coronary artery disease)     myoview 5/08:  EF 76%, no scar, no ischemia, +ECG changes with exercise;  cath 04/08/07:  pLAD 30%, mLAD 60-70% - med tx.  Marland Kitchen HLD (hyperlipidemia)   . Multiple system atrophy [facility])     [name]  . VAGINITIS, ATROPHIC 11/07/2007  . Osteoporosis 2016    DEXA T-4.4 spine deteriorated since 2012  . Chronic cystitis     recurrent UTIs, started bactrim ppx (McDiarmid)  . Internal hemorrhoids   . Hydronephrosis, bilateral     Past Surgical History  Procedure Laterality Date  . Laparoscopic nissen fundoplication    . Cystectomy    . Tumor removal    . Tubal ligation    . Colonoscopy N/A 08/29/2013    int hemm; Inda Castle, MD  . Dexa  12/2014    T -4.4 spine  . Esophagogastroduodenoscopy N/A 01/09/2015    Procedure: ESOPHAGOGASTRODUODENOSCOPY (EGD);  Surgeon: Inda Castle, MD;  [facility];  Service: Endoscopy;  Laterality: N/A;  help with transfers    There were no  vitals filed for this visit.  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 at [facility] clinic in Aug. 2013;Pt. had UTI on 04-11-15; went to ED - received catherization; states she has had increased weakness and some mild decline in mobility since this problem   Patient Stated Goals improve walking and mobility   Currently in Pain? No/denies                         [facility] Adult PT Treatment/Exercise - 01/10/16 0001    Transfers   Transfers Sit to Stand;Stand to Sit   Sit to Stand 3: Mod assist   Stand to Sit 3: Mod assist   Comments mod assist to scoot forward on mat   Ambulation/Gait   Ambulation/Gait Yes   Ambulation/Gait Assistance 4: Min assist  +1 assist for following with wheelchair;  mod assist with LOB   Ambulation Distance (Feet) 120 Feet  60' 2nd rep from mat to [employer] device Rolling walker   Gait Pattern Wide base of support;Decreased hip/knee flexion - right;Decreased hip/knee flexion - left;Decreased stride length;Step-through pattern;Abducted- right;Decreased trunk rotation   Ambulation Surface Level;Indoor   Lumbar Exercises: Stretches   Single Knee to Chest Stretch 2 reps;20 seconds   Double Knee to Chest Stretch 2 reps;20 seconds   Lower Trunk Rotation 2 reps;30 seconds  in seated position   Lumbar Exercises: Supine   Bridge 10 reps   Knee/Hip Exercises: Aerobic   Stationary Bike Nustep level 3 x 15" with UE's and LE's   Knee/Hip Exercises: Supine   Other Supine Knee/Hip Exercises alternate hip/knee flexion in hooklyin gposition x 10 reps each     Sit to stand from mat with  min assist; mod assist from wheelchair to RW             PT Short Term Goals - 12/08/15 1329    PT SHORT TERM GOAL #1   Title Pt. will increase standing tolerance so she is able to report standing 5" at home with S for independence with ADL's. (01-04-16)   Time 4   Period Weeks   Status New   PT SHORT TERM GOAL #2   Title Amb. with RW 150' without requiring a seated rest period to demo incr. endurance.   Baseline 6' on 12-05-15 with RW   Time 4   Period Weeks   Status New   PT SHORT TERM GOAL #3   Title Transfer sit to stand from wheelchair with mod assist   (01-04-16)   PT SHORT TERM GOAL #4   Title Improve TUG score by at least 5 secs to demo improved functional mobiltiy.  (01-04-16)   Baseline TUG TBA   Time 4   Period Weeks   Status New           PT Long Term Goals - 12/08/15 1333    PT LONG TERM GOAL #1   Title Pt. will ambulate 240' nonstop with RW to demo incr. endurance with CGA  (02-04-16)   Time 8   Period Weeks   Status New   PT LONG TERM GOAL #2   Title Report ability to stand at least 10" at home for improved standing tolerance  (02-04-16)   Time 8   Period Weeks   Status New   PT LONG TERM GOAL #3   Title Perform sit to stand transfer from mat to RW with +1 min  assist. (02-04-16)   Baseline mod to max assist needed from wheelchair to RW   Time 8   Period Weeks   Status New   PT LONG TERM GOAL #4   Title Perform at least 15" on Nustep to demo improved activity tolerance/endurance  (02-04-16)   Time 8   Period Weeks   Status New   PT LONG TERM GOAL #5   Title Independent to direct assistance prn with stretches and exercises for HEP.  (02-04-16)               Problem List Patient Active Problem List   Diagnosis Date Noted  . Neurogenic bladder   . Chronic cystitis 04/18/2015  . Pedal edema 04/01/2015  . Dyspepsia 01/09/2015  . Constipation 12/14/2014  . Medicare annual wellness visit, subsequent 11/27/2014  . Advanced care  planning/counseling discussion 11/27/2014  . Osteoporosis   . B-complex deficiency  06/07/2014  . Recurrent falls 02/06/2014  . Multiple system atrophy ([place])   . Dysphagia, unspecified(787.20) 07/28/2013  . Lower extremity edema 09/26/2012  . Shoulder pain, left 07/20/2012  . 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 Routine health maintenance 08/26/2011  . Murmur 05/27/2011  . HYPERCHOLESTEROLEMIA  IIA 05/14/2010  . VENOUS INSUFFICIENCY, LEGS 06/21/2009  . CORONARY HEART DISEASE 06/09/2008  . Hypothyroidism 05/15/2008  . ASTHMA 05/15/2008  . Depression 11/07/2007  . 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 GERD 11/07/2007  . VAGINITIS, ATROPHIC 11/07/2007    Alda Lea, PT 01/10/2016, 10:39 AM  [facility] [address] [place], [place], [address] Phone: [phone]   Fax:  [phone]  Name: [name] [name] MRN: [id number] Date of Birth: 1941-02-09

## 2016-01-13 ENCOUNTER — Ambulatory Visit: Payer: Medicare Other | Admitting: Physical Therapy

## 2016-01-13 DIAGNOSIS — R29898 Other symptoms and signs involving the musculoskeletal system: Secondary | ICD-10-CM | POA: Diagnosis not present

## 2016-01-13 DIAGNOSIS — R269 Unspecified abnormalities of gait and mobility: Secondary | ICD-10-CM

## 2016-01-13 DIAGNOSIS — R279 Unspecified lack of coordination: Secondary | ICD-10-CM | POA: Diagnosis not present

## 2016-01-13 DIAGNOSIS — M6289 Other specified disorders of muscle: Secondary | ICD-10-CM

## 2016-01-13 DIAGNOSIS — R2689 Other abnormalities of gait and mobility: Secondary | ICD-10-CM | POA: Diagnosis not present

## 2016-01-13 DIAGNOSIS — R270 Ataxia, unspecified: Secondary | ICD-10-CM | POA: Diagnosis not present

## 2016-01-13 DIAGNOSIS — R278 Other lack of coordination: Secondary | ICD-10-CM | POA: Diagnosis not present

## 2016-01-14 DIAGNOSIS — N3020 Other chronic cystitis without hematuria: Secondary | ICD-10-CM | POA: Diagnosis not present

## 2016-01-14 DIAGNOSIS — Z0000 Encounter for general adult medical examination without abnormal findings: Secondary | ICD-10-CM | POA: Diagnosis not present

## 2016-01-14 DIAGNOSIS — N3281 Overactive bladder: Secondary | ICD-10-CM | POA: Diagnosis not present

## 2016-01-15 ENCOUNTER — Encounter: Payer: Self-pay | Admitting: Physical Therapy

## 2016-01-15 ENCOUNTER — Other Ambulatory Visit: Payer: Self-pay | Admitting: Cardiovascular Disease

## 2016-01-15 NOTE — Therapy (Signed)
[facility] [address] [place], [place], [address] Phone: [phone]   Fax:  [phone]  Physical Therapy Treatment  Patient Details  Name: [name] [name] MRN: [id number] Date of Birth: June 07, 1941 Referring Provider: Dr. Star Age  Encounter Date: 01/13/2016      PT End of Session - 01/15/16 1441    Visit Number 9   Number of Visits 17   Date for PT Re-Evaluation 02/04/16   Authorization Type UHC MCR   Authorization Time Period 12-05-15 - 02-04-16   PT Start Time 1400   PT Stop Time 1447   PT Time Calculation (min) 47 min   Equipment Utilized During Treatment Gait belt      Past Medical History  Diagnosis Date  . GERD (gastroesophageal reflux disease)     s/p nissen  . Familial tremor     followed by Dr. Erling Cruz  . Unspecified chronic bronchitis ([place])   . Neurogenic bladder     Tannenbaum/MacDiarmid  . History of pneumonia   . Hypothyroidism   . Depression   . Asthma   . CAD (coronary artery disease)     myoview 5/08:  EF 76%, no scar, no ischemia, +ECG changes with exercise;  cath 04/08/07:  pLAD 30%, mLAD 60-70% - med tx.  Marland Kitchen HLD (hyperlipidemia)   . 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 VAGINITIS, ATROPHIC 11/07/2007  . Osteoporosis 2016    DEXA T-4.4 spine deteriorated since 2012  . Chronic cystitis     recurrent UTIs, started bactrim ppx (McDiarmid)  . Internal hemorrhoids   . Hydronephrosis, bilateral     Past Surgical History  Procedure Laterality Date  . Laparoscopic nissen fundoplication    . Cystectomy    . Tumor removal    . Tubal ligation    . Colonoscopy N/A 08/29/2013    int hemm; Inda Castle, MD  . Dexa  12/2014    T -4.4 spine  . Esophagogastroduodenoscopy N/A 01/09/2015    Procedure: ESOPHAGOGASTRODUODENOSCOPY (EGD);  Surgeon: Inda Castle, MD;  Location: [facility];  Service: Endoscopy;  Laterality: N/A;  help with transfers    There were no  vitals filed for this visit.  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 at [facility] clinic in Aug. 2013;Pt. had UTI on 04-11-15; went to ED - received catherization; states she has had increased weakness and some mild decline in mobility since this problem   Patient Stated Goals improve walking and mobility   Currently in Pain? No/denies                         [facility] Adult PT Treatment/Exercise - 01/15/16 0001    Ambulation/Gait   Ambulation/Gait Yes   Ambulation/Gait Assistance 4: Min assist;3: Mod assist  +1 assist for following with wheelchair; mod assist with LOB   Ambulation Distance (Feet) 80 Feet  then 40' to complete the lap   Assistive device Rolling walker   Gait Pattern  Wide base of support;Decreased hip/knee flexion - right;Decreased hip/knee flexion - left;Decreased stride length;Step-through pattern;Abducted- right;Decreased trunk rotation   Ambulation Surface Level;Indoor   Lumbar Exercises: Stretches   Single Knee to Chest Stretch 2 reps;20 seconds   Double Knee to Chest Stretch 2 reps;20 seconds   Lower Trunk Rotation 2 reps;30 seconds  in seated position   Lumbar Exercises: Supine   Bridge 10 reps   Knee/Hip Exercises: Aerobic   Stationary Bike Nustep level 3 x 15" with UE's and LE's                  PT Short Term Goals - 12/08/15 1329    PT SHORT TERM GOAL #1   Title Pt. will increase standing tolerance so she is able to report standing 5" at home with S for independence with ADL's. (01-04-16)   Time 4   Period Weeks   Status New   PT SHORT TERM  GOAL #2   Title Amb. with RW 150' without requiring a seated rest period to demo incr. endurance.   Baseline 67' on 12-05-15 with RW   Time 4   Period Weeks   Status New   PT SHORT TERM GOAL #3   Title Transfer sit to stand from wheelchair with mod assist   (01-04-16)   PT SHORT TERM GOAL #4   Title Improve TUG score by at least 5 secs to demo improved functional mobiltiy.  (01-04-16)   Baseline TUG TBA   Time 4   Period Weeks   Status New           PT Long Term Goals - 12/08/15 1333    PT LONG TERM GOAL #1   Title Pt. will ambulate 240' nonstop with RW to demo incr. endurance with CGA  (02-04-16)   Time 8   Period Weeks   Status New   PT LONG TERM GOAL #2   Title Report ability to stand at least 10" at home for improved standing tolerance  (02-04-16)   Time 8   Period Weeks   Status New   PT LONG TERM GOAL #3   Title Perform sit to stand transfer from mat to RW with +1 min  assist. (02-04-16)   Baseline mod to max assist needed from wheelchair to RW   Time 8   Period Weeks   Status New   PT LONG TERM GOAL #4   Title Perform at least 15" on Nustep to demo improved activity tolerance/endurance  (02-04-16)   Time 8   Period Weeks   Status New   PT LONG TERM GOAL #5   Title Independent to direct assistance prn with stretches and exercises for HEP.  (02-04-16)               Plan - 01/15/16 1442    Clinical Impression Statement Pt had significant freezing episode with RLE after 80' amb. distance - had to sit due to LOB and inability to move; pt able to finsih amb. around track after seated rest period   Pt will benefit from skilled therapeutic intervention in order to improve on the following deficits Abnormal gait;Decreased coordination;Impaired flexibility;Difficulty walking;Decreased balance;Increased muscle spasms;Impaired tone;Decreased mobility;Decreased strength;Decreased activity tolerance;Decreased range of motion   Rehab Potential Good   Clinical Impairments  Affecting Rehab Potential severity of deficits and progressive neurological disease   PT Frequency 2x / week   PT Duration 8 weeks   PT Treatment/Interventions ADLs/Self Care Home Management;Therapeutic activities;Patient/family education;Therapeutic exercise;Gait training;Balance  training;Stair training;Neuromuscular re-education;Functional mobility training   PT Next Visit Plan cont ther ex, gait training   PT Home Exercise Plan stretches and supine strengthening exs.   Consulted and Agree with Plan of Care Patient        Problem List Patient Active Problem List   Diagnosis Date Noted  . Neurogenic bladder   . Chronic cystitis 04/18/2015  . Pedal edema 04/01/2015  . Dyspepsia 01/09/2015  . Constipation 12/14/2014  . Medicare annual wellness visit, subsequent 11/27/2014  . Advanced care planning/counseling discussion 11/27/2014  . Osteoporosis   . B-complex deficiency 06/07/2014  . Recurrent falls 02/06/2014  . Multiple system atrophy ([place])   . Dysphagia, unspecified(787.20) 07/28/2013  . Lower extremity edema 09/26/2012  . Shoulder pain, left 07/20/2012  . 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 Routine health maintenance 08/26/2011  . Murmur 05/27/2011  . HYPERCHOLESTEROLEMIA  IIA 05/14/2010  . VENOUS INSUFFICIENCY, LEGS 06/21/2009  . CORONARY HEART DISEASE 06/09/2008  . Hypothyroidism 05/15/2008  . ASTHMA 05/15/2008  . Depression 11/07/2007  . 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 GERD 11/07/2007  . VAGINITIS, ATROPHIC 11/07/2007    Alda Lea, PT 01/15/2016, 2:45 PM  [facility] [address] [place], [place], [address] Phone: [phone]   Fax:  [phone]  Name: [name] [name] MRN: [id number] Date of Birth: 1941-04-11

## 2016-01-16 ENCOUNTER — Ambulatory Visit: Payer: Medicare Other | Admitting: Physical Therapy

## 2016-01-16 DIAGNOSIS — M6289 Other specified disorders of muscle: Secondary | ICD-10-CM | POA: Diagnosis not present

## 2016-01-16 DIAGNOSIS — R279 Unspecified lack of coordination: Secondary | ICD-10-CM

## 2016-01-16 DIAGNOSIS — R278 Other lack of coordination: Secondary | ICD-10-CM | POA: Diagnosis not present

## 2016-01-16 DIAGNOSIS — R29898 Other symptoms and signs involving the musculoskeletal system: Secondary | ICD-10-CM | POA: Diagnosis not present

## 2016-01-16 DIAGNOSIS — R2689 Other abnormalities of gait and mobility: Secondary | ICD-10-CM | POA: Diagnosis not present

## 2016-01-16 DIAGNOSIS — R270 Ataxia, unspecified: Secondary | ICD-10-CM | POA: Diagnosis not present

## 2016-01-16 DIAGNOSIS — R269 Unspecified abnormalities of gait and mobility: Secondary | ICD-10-CM | POA: Diagnosis not present

## 2016-01-18 NOTE — Therapy (Signed)
[facility] [address] [place], [place], [address] Phone: [phone]   Fax:  [phone]  Physical Therapy Treatment  Patient Details  Name: [name] [name] MRN: [id number] Date of Birth: September 25, 1941 Referring Provider: Dr. Star Age  Encounter Date: 01/16/2016      PT End of Session - 01/18/16 1131    Visit Number 10   Number of Visits 17   Date for PT Re-Evaluation 02/04/16   Authorization Type [employer] [facility]   Authorization Time Period 12-05-15 - 02-04-16   PT Start Time 1446   PT Stop Time 1535   PT Time Calculation (min) 49 min   Equipment Utilized During Treatment Gait belt      Past Medical History  Diagnosis Date  . GERD (gastroesophageal reflux disease)     s/p nissen  . Familial tremor     followed by Dr. Erling Cruz  . Unspecified chronic bronchitis ([place])   . Neurogenic bladder     Tannenbaum/MacDiarmid  . History of pneumonia   . Hypothyroidism   . Depression   . Asthma   . CAD (coronary artery disease)     myoview 5/08:  EF 76%, no scar, no ischemia, +ECG changes with exercise;  cath 04/08/07:  pLAD 30%, mLAD 60-70% - med tx.  Marland Kitchen HLD (hyperlipidemia)   . 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 VAGINITIS, ATROPHIC 11/07/2007  . Osteoporosis 2016    DEXA T-4.4 spine deteriorated since 2012  . Chronic cystitis     recurrent UTIs, started bactrim ppx (McDiarmid)  . Internal hemorrhoids   . Hydronephrosis, bilateral     Past Surgical History  Procedure Laterality Date  . Laparoscopic nissen fundoplication    . Cystectomy    . Tumor removal    . Tubal ligation    . Colonoscopy N/A 08/29/2013    int hemm; Inda Castle, MD  . Dexa  12/2014    T -4.4 spine  . Esophagogastroduodenoscopy N/A 01/09/2015    Procedure: ESOPHAGOGASTRODUODENOSCOPY (EGD);  Surgeon: Inda Castle, MD;  Location: [facility];  Service: Endoscopy;  Laterality: N/A;  help with transfers    There were no  vitals filed for this visit.  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 at [facility] clinic in Aug. 2013;Pt. had UTI on 04-11-15; went to ED - received catherization; states she has had increased weakness and some mild decline in mobility since this problem   Patient Stated Goals improve walking and mobility   Currently in Pain? No/denies                         [facility] Adult PT Treatment/Exercise - 01/18/16 0001    Ambulation/Gait   Ambulation/Gait Yes   Ambulation/Gait Assistance 4: Min assist;3: Mod assist  +1 assist for following with wheelchair; mod assist with LOB   Ambulation Distance (Feet) 140 Feet   Assistive device Rolling walker   Gait Pattern Wide base of support;Decreased hip/knee flexion - right;Decreased hip/knee flexion - left;Decreased stride length;Step-through pattern;Abducted- right;Decreased trunk rotation   Ambulation Surface Level;Indoor   Lumbar Exercises: Stretches  Single Knee to Chest Stretch 2 reps;20 seconds   Double Knee to Chest Stretch 2 reps;20 seconds   Lower Trunk Rotation 2 reps;30 seconds  in seated position   Lumbar Exercises: Supine   Bridge 10 reps   Knee/Hip Exercises: Aerobic   Stationary Bike Nustep level 3 x 15" with UE's and LE's   Knee/Hip Exercises: Supine   Other Supine Knee/Hip Exercises alternate hip/knee flexion in hooklyin gposition x 10 reps each                  PT Short Term Goals - 01/18/16 1133    PT SHORT TERM GOAL #1   Title Pt. will increase standing tolerance so she is able to report standing 5" at home with S for independence with ADL's. (01-04-16)   Baseline varies - depending on status which varies on  daily basis - 01-16-16   Status On-going   PT SHORT TERM GOAL #2   Title Amb. with RW 150' without requiring a seated rest period to demo incr. endurance.   Baseline 140' with RW on 01-16-16   Status Not Met   PT SHORT TERM GOAL #3   Title Transfer sit to stand from wheelchair with mod assist   (01-04-16)   Baseline met 01-16-16 - inconsistent at times due to fluctuations in status and performance   Status Achieved   PT SHORT TERM GOAL #4   Title Improve TUG score by at least 5 secs to demo improved functional mobiltiy.  (01-04-16)   Status Deferred           PT Long Term Goals - 12/08/15 1333    PT LONG TERM GOAL #1   Title Pt. will ambulate 240' nonstop with RW to demo incr. endurance with CGA  (02-04-16)   Time 8   Period Weeks   Status New   PT LONG TERM GOAL #2   Title Report ability to stand at least 10" at home for improved standing tolerance  (02-04-16)   Time 8   Period Weeks   Status New   PT LONG TERM GOAL #3   Title Perform sit to stand transfer from mat to RW with +1 min  assist. (02-04-16)   Baseline mod to max assist needed from wheelchair to RW   Time 8   Period Weeks   Status New   PT LONG TERM GOAL #4   Title Perform at least 15" on Nustep to demo improved activity tolerance/endurance  (02-04-16)   Time 8   Period Weeks   Status New   PT LONG TERM GOAL #5   Title Independent to direct assistance prn with stretches and exercises for HEP.  (02-04-16)               Plan - 01/18/16 1138    Clinical Impression Statement Pt has inconsistently met STG's #1 and 3; goal #2 not met as distance varies - 140' on 01-16-16:  #4 goal deferred due to safety concerns with amb. with incr. speed as this frequently results in freezing episodes   Pt will benefit from skilled therapeutic intervention in order to improve on the following deficits Abnormal gait;Decreased coordination;Impaired flexibility;Difficulty walking;Decreased balance;Increased muscle spasms;Impaired  tone;Decreased mobility;Decreased strength;Decreased activity tolerance;Decreased range of motion   Rehab Potential Good   Clinical Impairments Affecting Rehab Potential severity of deficits and progressive neurological disease   PT Frequency 2x / week   PT Duration 8 weeks   PT Treatment/Interventions ADLs/Self Care Home Management;Therapeutic  activities;Patient/family education;Therapeutic exercise;Gait training;Balance training;Stair training;Neuromuscular re-education;Functional mobility training   PT Next Visit Plan cont ther ex, gait training   PT Home Exercise Plan stretches and supine strengthening exs.   Consulted and Agree with Plan of Care Patient        Problem List Patient Active Problem List   Diagnosis Date Noted  . Neurogenic bladder   . Chronic cystitis 04/18/2015  . Pedal edema 04/01/2015  . Dyspepsia 01/09/2015  . Constipation 12/14/2014  . Medicare annual wellness visit, subsequent 11/27/2014  . Advanced care planning/counseling discussion 11/27/2014  . Osteoporosis   . B-complex deficiency 06/07/2014  . Recurrent falls 02/06/2014  . Multiple system atrophy ([place])   . Dysphagia, unspecified(787.20) 07/28/2013  . Lower extremity edema 09/26/2012  . Shoulder pain, left 07/20/2012  . 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 Routine health maintenance 08/26/2011  . Murmur 05/27/2011  . HYPERCHOLESTEROLEMIA  IIA 05/14/2010  . VENOUS INSUFFICIENCY, LEGS 06/21/2009  . CORONARY HEART DISEASE 06/09/2008  . Hypothyroidism 05/15/2008  . ASTHMA 05/15/2008  . Depression 11/07/2007  . 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 GERD 11/07/2007  . VAGINITIS, ATROPHIC 11/07/2007    Physical Therapy Progress Note  Dates of Reporting Period: 12-05-15 to 01-16-16  Objective Reports of Subjective Statement:  Pt is able to amb. Approx. 140' with RW with mod to min assist depending on severity and occurrence of freezing episodes in RLE; performance varies  Objective Measurements: Pt  requires min to max assist with transfers - status fluctuates depending in freezing episodes in RLE  Goal Update: see above - goals are inconsistently achieved due to fluctuations in performance and current functional status variation  Plan: Cont. Gait and balance training; therex for stretching and strengthening and ROM:  Neurore-ed for balance and PWR! exercises  Reason Skilled Services are Required: Decreased transfers, decr. Gait and balance; dependencies with all mobility which varies on daily basis   Alda Lea, PT 01/18/2016, 11:49 AM  [facility] [address] [place], [place], [address] Phone: [phone]   Fax:  [phone]  Name: ALLIAH BOULANGER MRN: [id number] Date of Birth: [birth year]

## 2016-01-20 ENCOUNTER — Ambulatory Visit: Payer: Medicare Other | Admitting: Physical Therapy

## 2016-01-21 ENCOUNTER — Telehealth: Payer: Self-pay | Admitting: Family Medicine

## 2016-01-21 ENCOUNTER — Ambulatory Visit: Payer: Medicare Other | Admitting: Physical Therapy

## 2016-01-21 DIAGNOSIS — R279 Unspecified lack of coordination: Secondary | ICD-10-CM | POA: Diagnosis not present

## 2016-01-21 DIAGNOSIS — R269 Unspecified abnormalities of gait and mobility: Secondary | ICD-10-CM

## 2016-01-21 DIAGNOSIS — M6289 Other specified disorders of muscle: Secondary | ICD-10-CM | POA: Diagnosis not present

## 2016-01-21 DIAGNOSIS — R278 Other lack of coordination: Secondary | ICD-10-CM | POA: Diagnosis not present

## 2016-01-21 DIAGNOSIS — R270 Ataxia, unspecified: Secondary | ICD-10-CM | POA: Diagnosis not present

## 2016-01-21 DIAGNOSIS — R2689 Other abnormalities of gait and mobility: Secondary | ICD-10-CM | POA: Diagnosis not present

## 2016-01-21 DIAGNOSIS — R29898 Other symptoms and signs involving the musculoskeletal system: Secondary | ICD-10-CM | POA: Diagnosis not present

## 2016-01-21 NOTE — Telephone Encounter (Signed)
[facility] Call Center  Patient Name: [name] [name]  DOB: [birth year]    Initial Comment Caller states they are constantly feeling cold along with fatigue. She does currently use a walker but it is becoming more difficult to walk as well.    Nurse Assessment  Nurse: Justine Null, RN, Rodena Piety Date/Time (Eastern Time): 01/21/2016 4:02:33 PM  Confirm and document reason for call. If symptomatic, describe symptoms. You must click the next button to save text entered. ---Caller states they are constantly feeling cold along with fatigue. She does currently use a walker but it is becoming more difficult to walk as well. Caller stated that she has just that she has been in the nursing home and they are unable t hear the phone call as the reception was bad  Has the patient traveled out of the country within the last 30 days? ---Not Applicable  Does the patient have any new or worsening symptoms? ---Yes  Will a triage be completed? ---No  Select reason for no triage. ---Other  Please document clinical information provided and list any resource used. ---patient was unable to hear the triager as her phone reception was bad and the triager directed for her to call back when she was not in the nursing home verbalized understanding     Guidelines    Guideline Title Affirmed Question Affirmed Notes       Final Disposition User   Clinical Call Justine Null, RN, Rodena Piety

## 2016-01-22 ENCOUNTER — Ambulatory Visit (INDEPENDENT_AMBULATORY_CARE_PROVIDER_SITE_OTHER): Payer: Medicare Other | Admitting: Nurse Practitioner

## 2016-01-22 ENCOUNTER — Telehealth: Payer: Self-pay | Admitting: Neurology

## 2016-01-22 ENCOUNTER — Encounter: Payer: Self-pay | Admitting: Nurse Practitioner

## 2016-01-22 VITALS — BP 152/77 | HR 77 | Temp 98.1°F | Ht 62.5 in

## 2016-01-22 DIAGNOSIS — G903 Multi-system degeneration of the autonomic nervous system: Secondary | ICD-10-CM

## 2016-01-22 DIAGNOSIS — G239 Degenerative disease of basal ganglia, unspecified: Secondary | ICD-10-CM | POA: Diagnosis not present

## 2016-01-22 DIAGNOSIS — E538 Deficiency of other specified B group vitamins: Secondary | ICD-10-CM | POA: Diagnosis not present

## 2016-01-22 MED ORDER — BACLOFEN 10 MG PO TABS: 5.0000 mg | ORAL_TABLET | Freq: Three times a day (TID) | ORAL | Status: DC

## 2016-01-22 MED ORDER — CYANOCOBALAMIN 1000 MCG/ML IJ SOLN
1000.0000 ug | Freq: Once | INTRAMUSCULAR | Status: AC
  Administered 2016-01-22: 1000 ug via INTRAMUSCULAR

## 2016-01-22 NOTE — Therapy (Signed)
[facility] [address] [place], [place], [address] Phone: [phone]   Fax:  [phone]  Physical Therapy Treatment  Patient Details  Name: [name] [name] MRN: [id number] Date of Birth: 1941/06/05 Referring Provider: Dr. Star Age  Encounter Date: 01/21/2016      PT End of Session - 01/22/16 2035    Visit Number 11   Number of Visits 17   Date for PT Re-Evaluation 02/04/16   Authorization Type [employer] [facility]   Authorization Time Period 12-05-15 - 02-04-16   PT Start Time 1446   PT Stop Time 1532   PT Time Calculation (min) 46 min   Equipment Utilized During Treatment Gait belt      Past Medical History  Diagnosis Date  . GERD (gastroesophageal reflux disease)     s/p nissen  . Familial tremor     followed by Dr. Erling Cruz  . Unspecified chronic bronchitis ([place])   . Neurogenic bladder     Tannenbaum/MacDiarmid  . History of pneumonia   . Hypothyroidism   . Depression   . Asthma   . CAD (coronary artery disease)     myoview 5/08:  EF 76%, no scar, no ischemia, +ECG changes with exercise;  cath 04/08/07:  pLAD 30%, mLAD 60-70% - med tx.  Marland Kitchen HLD (hyperlipidemia)   . Multiple system atrophy [facility])     [name]  . VAGINITIS, ATROPHIC 11/07/2007  . Osteoporosis 2016    DEXA T-4.4 spine deteriorated since 2012  . Chronic cystitis     recurrent UTIs, started bactrim ppx (McDiarmid)  . Internal hemorrhoids   . Hydronephrosis, bilateral     Past Surgical History  Procedure Laterality Date  . Laparoscopic nissen fundoplication    . Cystectomy    . Tumor removal    . Tubal ligation    . Colonoscopy N/A 08/29/2013    int hemm; Inda Castle, MD  . Dexa  12/2014    T -4.4 spine  . Esophagogastroduodenoscopy N/A 01/09/2015    Procedure: ESOPHAGOGASTRODUODENOSCOPY (EGD);  Surgeon: Inda Castle, MD;  Location: [facility];  Service: Endoscopy;  Laterality: N/A;  help with transfers    There were no  vitals filed for this visit.  Visit Diagnosis:  Abnormality of gait  Abnormal increased muscle tone  Lack of coordination      Subjective Assessment - 01/22/16 2031    Subjective Pt states she is going to see Dr. Darnell Level on Wed. 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 at [facility] clinic in Aug. 2013;Pt. had UTI on 04-11-15; went to ED - received catherization; states she has had increased weakness and some mild decline in mobility since this problem   Patient Stated Goals improve walking and mobility   Currently in Pain? No/denies                         [facility] Adult PT Treatment/Exercise - 01/22/16 0001    Transfers   Transfers Sit to Stand;Stand to Sit   Sit to Stand 3: Mod assist   Stand to Sit 3: Mod assist   Ambulation/Gait   Ambulation/Gait Yes   Ambulation/Gait Assistance 4: Min assist;3: Mod assist   Ambulation Distance (Feet) 120 Feet   Assistive device Rolling walker   Gait Pattern Wide base of support;Decreased  hip/knee flexion - right;Decreased hip/knee flexion - left;Decreased stride length;Step-through pattern;Abducted- right;Decreased trunk rotation   Ambulation Surface Level;Indoor   Neuro Re-ed    Neuro Re-ed Details  Pt performed seated PWR! reach exercise to floor and up overhead to fasciltate anterior weight shift and trunk extension x 10 reps; also performed seated lateral trun rotation with hands meeting together on either side 10 reps to R and L as able   Lumbar Exercises: Stretches   Single Knee to Chest Stretch 2 reps;20 seconds   Double Knee to Chest Stretch 2 reps;20 seconds   Lower Trunk Rotation 2 reps;30 seconds  in seated position   Lumbar Exercises: Supine   Bridge 10 reps                  PT Short Term Goals - 01/18/16 1133    PT SHORT TERM GOAL #1   Title Pt. will increase standing tolerance so she  is able to report standing 5" at home with S for independence with ADL's. (01-04-16)   Baseline varies - depending on status which varies on daily basis - 01-16-16   Status On-going   PT SHORT TERM GOAL #2   Title Amb. with RW 150' without requiring a seated rest period to demo incr. endurance.   Baseline 140' with RW on 01-16-16   Status Not Met   PT SHORT TERM GOAL #3   Title Transfer sit to stand from wheelchair with mod assist   (01-04-16)   Baseline met 01-16-16 - inconsistent at times due to fluctuations in status and performance   Status Achieved   PT SHORT TERM GOAL #4   Title Improve TUG score by at least 5 secs to demo improved functional mobiltiy.  (01-04-16)   Status Deferred           PT Long Term Goals - 12/08/15 1333    PT LONG TERM GOAL #1   Title Pt. will ambulate 240' nonstop with RW to demo incr. endurance with CGA  (02-04-16)   Time 8   Period Weeks   Status New   PT LONG TERM GOAL #2   Title Report ability to stand at least 10" at home for improved standing tolerance  (02-04-16)   Time 8   Period Weeks   Status New   PT LONG TERM GOAL #3   Title Perform sit to stand transfer from mat to RW with +1 min  assist. (02-04-16)   Baseline mod to max assist needed from wheelchair to RW   Time 8   Period Weeks   Status New   PT LONG TERM GOAL #4   Title Perform at least 15" on Nustep to demo improved activity tolerance/endurance  (02-04-16)   Time 8   Period Weeks   Status New   PT LONG TERM GOAL #5   Title Independent to direct assistance prn with stretches and exercises for HEP.  (02-04-16)               Plan - 01/22/16 2038    Clinical Impression Statement Pt has difficulty with R foot freezing - pt also has trouble scooting forward which impacts transfer ability; R foot also noted to be supinating due to incr. tone during gait   Pt will benefit from skilled therapeutic intervention in order to improve on the following deficits Abnormal gait;Decreased  coordination;Impaired flexibility;Difficulty walking;Decreased balance;Increased muscle spasms;Impaired tone;Decreased mobility;Decreased strength;Decreased activity tolerance;Decreased range of motion   Rehab Potential Good  Clinical Impairments Affecting Rehab Potential severity of deficits and progressive neurological disease   PT Frequency 2x / week   PT Duration 8 weeks   PT Treatment/Interventions ADLs/Self Care Home Management;Therapeutic activities;Patient/family education;Therapeutic exercise;Gait training;Balance training;Stair training;Neuromuscular re-education;Functional mobility training   PT Next Visit Plan cont ther ex, gait training   PT Home Exercise Plan stretches and supine strengthening exs.   Consulted and Agree with Plan of Care Patient        Problem List Patient Active Problem List   Diagnosis Date Noted  . Neurogenic bladder   . Chronic cystitis 04/18/2015  . Pedal edema 04/01/2015  . Dyspepsia 01/09/2015  . Constipation 12/14/2014  . Medicare annual wellness visit, subsequent 11/27/2014  . Advanced care planning/counseling discussion 11/27/2014  . Osteoporosis   . B-complex deficiency 06/07/2014  . Recurrent falls 02/06/2014  . Multiple system atrophy ([place])   . Dysphagia, unspecified(787.20) 07/28/2013  . Lower extremity edema 09/26/2012  . Shoulder pain, left 07/20/2012  . 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 Routine health maintenance 08/26/2011  . Murmur 05/27/2011  . HYPERCHOLESTEROLEMIA  IIA 05/14/2010  . VENOUS INSUFFICIENCY, LEGS 06/21/2009  . CORONARY HEART DISEASE 06/09/2008  . Hypothyroidism 05/15/2008  . ASTHMA 05/15/2008  . Depression 11/07/2007  . 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 GERD 11/07/2007  . VAGINITIS, ATROPHIC 11/07/2007    Alda Lea, PT 01/22/2016, 8:44 PM  [facility] [address] [place], [place], [address] Phone: [phone]   Fax:   [phone]  Name: [name] [name] MRN: [id number] Date of Birth: 11-29-1940

## 2016-01-22 NOTE — Patient Instructions (Signed)
B12 injection today Restart Baclofen 1/2 tab three times daily Continue PT F/U with Dr. Rexene Alberts

## 2016-01-22 NOTE — Telephone Encounter (Signed)
Patient called requesting to be seen today, is feeling really bad, is having a hard time moving, symptoms are related to [place], please call [phone] or [phone].

## 2016-01-22 NOTE — Progress Notes (Addendum)
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 She is accompanied by her husband again today. I last saw her on 07/29/2015, at which time she reported doing fairly well. No choking, no recent falls and he her husband was helping her with mobilizing with a walker. She had to go to the emergency room on 04/11/2015 for UTI. She was in outpatient physical therapy. She was seeing her urologist regularly and they had talked about Botox injections and potentially a surgically placed catheter. She had no headaches. She had a follow-up phone call from the [facility] about a potential research study but she did not get encouragement to pursue it from the doctor. In the interim, she calls in October 2016 reporting a 2-4 day history of increased muscle spasms. She felt that she had spasms all over. She was encouraged to see her primary care provider. She was seen and had blood work which showed no abnormality of her electrolytes, kidney function. Her magnesium level was normal.    11/09/2016Dr. [name]: She reports being bothered by muscle spasms which are in different places. These are not cramps. She has smaller spasms in different areas including sides, lower back, neck, thighs and sometimes these are painful and bothersome. Otherwise, she is unchanged. She had a thyroid function test in May of this year which was fine.  UPDATE  03/15/2017CM [name], 75 year old female returns for follow-up on an urgent basis. She claims that her fatigue and muscle spasms have worsened. She does not describe these as cramping sensations. They occur in the lower back is painful and bothersome. She also feels her tremor is worse. She tried to get her thyroid checked yesterday at her primary care but was told there was no availability and Dr. Eddie [name] was going to be out of town. She is seated in a wheelchair today and she is continuing her physical therapy. She is currently getting this twice a week. According to the PT note she was able to ambulate 140 feet several days ago. She has not fallen. She has a walker in the home. She denies any symptoms of infection she has not been running a fever. She does complain with dry mouth and excessive thirst, she was encouraged to increase her fluid intake. She returns for reevaluation REVIEW OF SYSTEMS: Full 14 system review of systems performed and notable only for those listed, all others are neg:  Constitutional: neg  Cardiovascular: leg swelling Ear/Nose/Throat: neg  Skin: neg Eyes: neg Respiratory: neg Gastroitestinal: urinary frequency Hematology/Lymphatic: neg  Endocrine: excessive thirst Musculoskeletal:gait abnormality Allergy/Immunology: neg Neurological: neg Psychiatric: neg Sleep : insomnia   ALLERGIES: Allergies  Allergen Reactions  . Keflex [Cephalexin] Diarrhea    HOME MEDICATIONS: Outpatient Prescriptions Prior to Visit  Medication Sig Dispense Refill  . AFLURIA PRESERVATIVE FREE injection     . alum & mag hydroxide-simeth (MYLANTA) 678-938-10 MG/5ML suspension Take 15 mLs by mouth every 6 (six) hours as needed for indigestion or heartburn.    Marland Kitchen  aspirin (ASPIRIN EC) 81 MG EC tablet Take 81 mg by mouth 2 (two) times a week. Swallow whole. Take at bedtime    . cephALEXin (KEFLEX) 250 MG capsule Take 1 capsule (250 mg total) by mouth daily.    . clonazePAM (KLONOPIN) 0.5  MG tablet TAKE 1 TO 2 TABLETS BY MOUTH EVERY DAY AS NEEDED 60 tablet 3  . Cranberry (ELLURA) 200 MG CAPS Take 1 capsule by mouth daily.    . CRESTOR 20 MG tablet TAKE ONE-HALF TABLET BY MOUTH ONCE EVERY EVENING 45 tablet 3  . cyanocobalamin (,VITAMIN B-12,) 1000 MCG/ML injection Inject 1,000 mcg into the muscle every 30 (thirty) days.      . famotidine (PEPCID) 40 MG tablet TAKE 1 TABLET (40 MG TOTAL) BY MOUTH 2 (TWO) TIMES DAILY AS NEEDED FOR HEARTBURN. 180 tablet 1  . fluocinolone (VANOS) 0.01 % cream Apply 1 application topically 2 (two) times daily as needed (itching).     . furosemide (LASIX) 20 MG tablet Take 1 tablet (20 mg total) by mouth daily as needed for fluid or edema. 30 tablet 0  . isosorbide mononitrate (IMDUR) 30 MG 24 hr tablet TAKE 1/2 TABLET BY MOUTH DAILY 30 tablet 5  . nitroGLYCERIN (NITROSTAT) 0.4 MG SL tablet Place 1 tablet (0.4 mg total) under the tongue every 5 (five) minutes as needed (chest pain). 25 tablet 3  . propranolol (INDERAL) 10 MG tablet TAKE 1 TABLET BY MOUTH 3 TIMES DAILY AS NEEDED (TREMORS). 180 tablet 3  . sertraline (ZOLOFT) 25 MG tablet Take 1 tablet (25 mg total) by mouth daily. 90 tablet 3  . SYNTHROID 112 MCG tablet TAKE 1 TABLET (112 MCG TOTAL) BY MOUTH DAILY BEFORE BREAKFAST. 30 tablet 6  . albuterol (PROAIR HFA) 108 (90 BASE) MCG/ACT inhaler Inhale 2 puffs into the lungs every 6 (six) hours as needed for wheezing or shortness of breath. Reported on 01/22/2016    . beclomethasone (QVAR) 80 MCG/ACT inhaler Inhale 1 puff into the lungs daily as needed (wheezing). Reported on 01/22/2016    . Calcium Carbonate-Vitamin D (CALCIUM-VITAMIN D) 500-200 MG-UNIT per tablet Take 1 tablet by mouth daily. Reported on 01/22/2016    . hydrOXYzine (ATARAX/VISTARIL) 10 MG tablet Take 10 mg by mouth 3 (three) times daily as needed for itching. Reported on 01/22/2016    . hyoscyamine (LEVSIN SL) 0.125 MG SL tablet PLACE 2 TABLETS (0.25 MG TOTAL) UNDER THE TONGUE EVERY 4 (FOUR)  HOURS AS NEEDED (SPASMS). (Patient not taking: Reported on 01/22/2016) 30 tablet 5  . ibandronate (BONIVA) 3 MG/3ML SOLN injection Inject 3 mg into the vein once. Reported on 01/22/2016    . levothyroxine (SYNTHROID, LEVOTHROID) 112 MCG tablet Take 112 mcg by mouth daily before breakfast. Reported on 01/22/2016    . loratadine (CLARITIN) 10 MG tablet Take 10 mg by mouth 2 (two) times daily as needed (allergies). Reported on 01/22/2016    . mometasone (NASONEX) 50 MCG/ACT nasal spray Place 2 sprays into the nose daily as needed (allergies). Reported on 01/22/2016    . polyethylene glycol powder (GLYCOLAX/MIRALAX) powder Take 17 g by mouth daily as needed for moderate constipation. (Patient not taking: Reported on 01/22/2016) 250 g 1  . triamcinolone cream (KENALOG) 0.1 % Apply 1 application topically daily. Reported on 01/22/2016     No facility-administered medications prior to visit.    PAST MEDICAL HISTORY: Past Medical History  Diagnosis Date  . GERD (gastroesophageal reflux disease)     s/p nissen  .  Familial tremor     followed by Dr. Erling Cruz  . Unspecified chronic bronchitis ([place])   . Neurogenic bladder     Tannenbaum/MacDiarmid  . History of pneumonia   . Hypothyroidism   . Depression   . Asthma   . CAD (coronary artery disease)     myoview 5/08:  EF 76%, no scar, no ischemia, +ECG changes with exercise;  cath 04/08/07:  pLAD 30%, mLAD 60-70% - med tx.  Marland Kitchen HLD (hyperlipidemia)   . Multiple system atrophy [facility])     [name]  . VAGINITIS, ATROPHIC 11/07/2007  . Osteoporosis 2016    DEXA T-4.4 spine deteriorated since 2012  . Chronic cystitis     recurrent UTIs, started bactrim ppx (McDiarmid)  . Internal hemorrhoids   . Hydronephrosis, bilateral     PAST SURGICAL HISTORY: Past Surgical History  Procedure Laterality Date  . Laparoscopic nissen fundoplication    . Cystectomy    . Tumor removal    . Tubal ligation    . Colonoscopy N/A 08/29/2013    int hemm; Inda Castle, MD  . Dexa  12/2014    T -4.4 spine  . Esophagogastroduodenoscopy N/A 01/09/2015    Procedure: ESOPHAGOGASTRODUODENOSCOPY (EGD);  Surgeon: Inda Castle, MD;  Location: [facility];  Service: Endoscopy;  Laterality: N/A;  help with transfers    FAMILY HISTORY: Family History  Problem Relation Age of Onset  . Coronary artery disease Father   . Heart attack Father   . Diabetes Daughter   . Breast cancer Maternal Aunt   . Hypertension Mother   . Heart disease Father   . Coronary artery disease Daughter   . Colon cancer Maternal Aunt   . Multiple sclerosis Daughter   . Stroke Daughter   . Heart attack Daughter     SOCIAL HISTORY: Social History   Social History  . Marital Status: Married    Spouse Name: [name]  . Number of Children: 2  . Years of Education: HS   Occupational History  .  Other   Social History Main Topics  . Smoking status: Never Smoker   . Smokeless tobacco: Never Used  . Alcohol Use: No  . Drug Use: No  . Sexual Activity: Not Currently   Other Topics Concern  . Not on file   Social History Narrative   HSG, retired from [employer] . married '60. 1 son - '65; 1 daughter - '61; 2 grandchildren; 1 great-grandchild. Homemaker. marriage in good health. 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 Positive history of passive tobacco smoke exposure.   Caffeine Use: 1 cup daily; 2 glasses of tea daily     PHYSICAL EXAM  Filed Vitals:   01/22/16 1540  BP: 152/77  Pulse: 77  Temp: 98.1 F (36.7 C)  TempSrc: Oral  Height: 5' 2.5" (1.588 m)   There is no weight on file to calculate BMI.  Generalized: Well developed, in no acute distress, well groomed, mild masking of the face Head: normocephalic and atraumatic,. Oropharynx with moderate dryness Neck: moderate rigidity Cardiac: Regular rate rhythm, no murmur  Extremities 1+ edema in the distal lower extremities bilaterally Musculoskeletal: No deformity   Neurological examination   Mentation: Alert  oriented to time, place, history taking. Attention span and concentration appropriate. Recent and remote memory intact.  Follows all commands speech is mildly tremulous and hypophonic. She is pleasant affect is normal   Cranial nerve II-XII: Pupils were equal round reactive to light extraocular movements  with mild limitation to upgaze, visual field were full on confrontational test. Facial sensation and strength were normal. hearing is impaired with hearing aids Uvula tongue midline. head turning and shoulder shrug were normal and symmetric.Tongue protrusion into cheek strength was normal. Motor: thin bulk 4 out of 5 global strength and tone  increased throughout worse on the right. She has a mild action tremor bilaterally as well as a mild postural tremor right worse than left.  Sensory: normal and symmetric to light touch, in the upper and lower extremities Coordination: finger-nose-finger, no dysmetria Reflexes: Brachioradialis 2/2, biceps 2/2, triceps 2/2,  3+ in the lower extremities,  Gait and Station: in wheelchair not ambulated DIAGNOSTIC DATA (LABS, IMAGING, TESTING) - I reviewed patient records, labs, notes, testing and imaging myself where available.  Lab Results  Component Value Date   WBC 11.1* 04/11/2015   HGB 13.3 04/11/2015   HCT 40.0 04/11/2015   MCV 85.3 04/11/2015   PLT 267 04/11/2015      Component Value Date/Time   NA 140 08/30/2015 1408   K 4.0 08/30/2015 1408   CL 105 08/30/2015 1408   CO2 28 08/30/2015 1408   GLUCOSE 116* 08/30/2015 1408   BUN 12 08/30/2015 1408   CREATININE 0.76 08/30/2015 1408   CALCIUM 9.8 08/30/2015 1408   PROT 6.6 11/20/2014 1301   ALBUMIN 3.9 11/20/2014 1301   AST 20 11/20/2014 1301   ALT 18 11/20/2014 1301   ALKPHOS 113 11/20/2014 1301   BILITOT 0.8 11/20/2014 1301   GFRNONAA >60 04/11/2015 0419   GFRAA >60 04/11/2015 0419   Lab Results  Component Value Date   CHOL 130 11/20/2014   HDL 43.90 11/20/2014   LDLCALC 63 11/20/2014     LDLDIRECT 161.5 06/09/2010   TRIG 115.0 11/20/2014   CHOLHDL 3 11/20/2014   No results found for: HGBA1C No results found for: VITAMINB12 Lab Results  Component Value Date   TSH 0.88 04/01/2015      ASSESSMENT AND PLAN  75 y.o. year old female  has a past medical history of  Familial tremor;  Neurogenic bladder; Hypothyroidism; Depression; Asthma; CAD (coronary artery disease); HLD (hyperlipidemia); Multiple system atrophy ([place]); for increased spasms last couple of days and fatigue. Records reviewed. Patient also wants thyroid function checked and she was made aware that this is done at primary care  B12 injection today Restart Baclofen 1/2 tab three times daily this is for spasms Continue PT as ordered  Additional questions answered for her husband about multiple system atrophy and progression of symptoms. F/U with Dr. Rexene Alberts in 3 monthsVst time 49 min Dennie Bible, [facility], [facility], APRN  [facility] [address], [place], [place] [address] [phone]  I reviewed the above note and documentation by the Nurse Practitioner and agree with the history, physical exam, assessment and plan as outlined above. I was immediately available for face-to-face consultation. Star Age, MD, PhD [facility])

## 2016-01-22 NOTE — Progress Notes (Signed)
Gave vitamin b12 injection in right deltoid. Cleaned with alcohol wipe prior to injection. Applied band-aid. Pt tolerated well.

## 2016-01-22 NOTE — Telephone Encounter (Signed)
I spoke to Dr. Rexene Alberts and Hoyle Sauer NP, Dr. Rexene Alberts does not have any openings this afternoon, Hoyle Sauer agreed to see her. I spoke to patient and she is ok with this. Appt set for 3:30.

## 2016-01-23 ENCOUNTER — Ambulatory Visit: Payer: Medicare Other | Admitting: Physical Therapy

## 2016-01-27 ENCOUNTER — Ambulatory Visit: Payer: Self-pay | Admitting: Neurology

## 2016-01-28 ENCOUNTER — Ambulatory Visit: Payer: Medicare Other | Admitting: Physical Therapy

## 2016-01-28 DIAGNOSIS — M6289 Other specified disorders of muscle: Secondary | ICD-10-CM

## 2016-01-28 DIAGNOSIS — R278 Other lack of coordination: Secondary | ICD-10-CM

## 2016-01-28 DIAGNOSIS — R269 Unspecified abnormalities of gait and mobility: Secondary | ICD-10-CM

## 2016-01-28 DIAGNOSIS — R279 Unspecified lack of coordination: Secondary | ICD-10-CM

## 2016-01-28 DIAGNOSIS — R270 Ataxia, unspecified: Secondary | ICD-10-CM | POA: Diagnosis not present

## 2016-01-28 DIAGNOSIS — R2689 Other abnormalities of gait and mobility: Secondary | ICD-10-CM | POA: Diagnosis not present

## 2016-01-28 DIAGNOSIS — R29898 Other symptoms and signs involving the musculoskeletal system: Secondary | ICD-10-CM | POA: Diagnosis not present

## 2016-01-29 NOTE — Therapy (Signed)
[facility] [address] [place], [place], [address] Phone: [phone]   Fax:  [phone]  Physical Therapy Treatment  Patient Details  Name: [name] [name] MRN: [id number] Date of Birth: [birth year] Referring Provider: Dr. Star Age  Encounter Date: 01/28/2016      PT End of Session - 01/29/16 1136    Visit Number 13   Number of Visits 17   Date for PT Re-Evaluation 02/04/16   Authorization Type UHC [facility]   Authorization Time Period 12-05-15 - 02-04-16      Past Medical History  Diagnosis Date  . GERD (gastroesophageal reflux disease)     s/p nissen  . Familial tremor     followed by Dr. Erling Cruz  . Unspecified chronic bronchitis ([place])   . Neurogenic bladder     Tannenbaum/MacDiarmid  . History of pneumonia   . Hypothyroidism   . Depression   . Asthma   . CAD (coronary artery disease)     myoview 5/08:  EF 76%, no scar, no ischemia, +ECG changes with exercise;  cath 04/08/07:  pLAD 30%, mLAD 60-70% - med tx.  Marland Kitchen HLD (hyperlipidemia)   . 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 VAGINITIS, ATROPHIC 11/07/2007  . Osteoporosis 2016    DEXA T-4.4 spine deteriorated since 2012  . Chronic cystitis     recurrent UTIs, started bactrim ppx (McDiarmid)  . Internal hemorrhoids   . Hydronephrosis, bilateral     Past Surgical History  Procedure Laterality Date  . Laparoscopic nissen fundoplication    . Cystectomy    . Tumor removal    . Tubal ligation    . Colonoscopy N/A 08/29/2013    int hemm; Inda Castle, MD  . Dexa  12/2014    T -4.4 spine  . Esophagogastroduodenoscopy N/A 01/09/2015    Procedure: ESOPHAGOGASTRODUODENOSCOPY (EGD);  Surgeon: Inda Castle, MD;  Location: [facility];  Service: Endoscopy;  Laterality: N/A;  help with transfers    There were no vitals filed for this visit.  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 at [facility] clinic in Aug. 2013;Pt. had UTI on 04-11-15; went to ED - received catherization; states she has had increased weakness and some mild decline in mobility since this problem   Patient Stated Goals improve walking and mobility   Currently in Pain? No/denies                         [facility] Adult PT Treatment/Exercise - 01/29/16 0001    Ambulation/Gait   Ambulation/Gait Yes   Ambulation/Gait Assistance 3: Mod assist   Ambulation Distance (Feet) 80 Feet   Assistive device Rolling walker   Gait Pattern Wide base of support;Decreased hip/knee flexion - right;Decreased hip/knee flexion - left;Decreased stride length;Step-through pattern;Abducted- right;Decreased trunk rotation   Ambulation Surface Level;Indoor   Lumbar Exercises: Stretches   Lower Trunk Rotation 2 reps;30 seconds  in seated position   Piriformis Stretch  1 rep;20 seconds  both R and L sides   Lumbar Exercises: Supine   Bridge 10 reps  2 sets   Knee/Hip Exercises: Aerobic   Stationary Bike SciFit level 1.5 x 10"  performed from wheelchair    occasional max assist needed with gait - pt has more difficulty with turning around curves on track  Pt requires mod assist to transfer LE's onto mat with sit to supine transfer     [facility]) - 01/29/16 1139    PWR! Up 10 reps   PWR! Twist 10 reps             PT Education - 01/29/16 1136    Education provided Yes   Education Details recommend use of gait belt to incr. safety with assistance with mobility at home - pt verbalized agreement   Person(s) Educated Patient   Methods  Explanation   Comprehension Verbalized understanding          PT Short Term Goals - 01/18/16 1133    PT SHORT TERM GOAL #1   Title Pt. will increase standing tolerance so she is able to report standing 5" at home with S for independence with ADL's. (01-04-16)   Baseline varies - depending on status which varies on daily basis - 01-16-16   Status On-going   PT SHORT TERM GOAL #2   Title Amb. with RW 150' without requiring a seated rest period to demo incr. endurance.   Baseline 140' with RW on 01-16-16   Status Not Met   PT SHORT TERM GOAL #3   Title Transfer sit to stand from wheelchair with mod assist   (01-04-16)   Baseline met 01-16-16 - inconsistent at times due to fluctuations in status and performance   Status Achieved   PT SHORT TERM GOAL #4   Title Improve TUG score by at least 5 secs to demo improved functional mobiltiy.  (01-04-16)   Status Deferred           PT Long Term Goals - 12/08/15 1333    PT LONG TERM GOAL #1   Title Pt. will ambulate 240' nonstop with RW to demo incr. endurance with CGA  (02-04-16)   Time 8   Period Weeks   Status New   PT LONG TERM GOAL #2   Title Report ability to stand at least 10" at home for improved standing tolerance  (02-04-16)   Time 8   Period Weeks   Status New   PT LONG TERM GOAL #3   Title Perform sit to stand transfer from mat to RW with +1 min  assist. (02-04-16)   Baseline mod to max assist needed from wheelchair to RW   Time 8   Period Weeks   Status New   PT LONG TERM GOAL #4   Title Perform at least 15" on Nustep to demo improved activity tolerance/endurance  (02-04-16)   Time 8   Period Weeks   Status New   PT LONG TERM GOAL #5   Title Independent to direct assistance prn with stretches and exercises for HEP.  (02-04-16)               Plan - 01/29/16 1133    Clinical Impression Statement Pt has significant decline in mobility with incr. freezing episodes of RLE with R foot inverted due to incr. tone; pt having  much difficulty with ambulation; ataxic involuntary movements noted in RLE today during session   Pt will benefit from skilled therapeutic  intervention in order to improve on the following deficits Abnormal gait;Decreased coordination;Impaired flexibility;Difficulty walking;Decreased balance;Increased muscle spasms;Impaired tone;Decreased mobility;Decreased strength;Decreased activity tolerance;Decreased range of motion   Rehab Potential Good   Clinical Impairments Affecting Rehab Potential severity of deficits and progressive neurological disease   PT Frequency 2x / week   PT Duration 8 weeks   PT Treatment/Interventions ADLs/Self Care Home Management;Therapeutic activities;Patient/family education;Therapeutic exercise;Gait training;Balance training;Stair training;Neuromuscular re-education;Functional mobility training   PT Next Visit Plan cont ther ex, gait training   PT Home Exercise Plan stretches and supine strengthening exs.   Consulted and Agree with Plan of Care Patient        Problem List Patient Active Problem List   Diagnosis Date Noted  . Neurogenic bladder   . Chronic cystitis 04/18/2015  . Pedal edema 04/01/2015  . Dyspepsia 01/09/2015  . Constipation 12/14/2014  . Medicare annual wellness visit, subsequent 11/27/2014  . Advanced care planning/counseling discussion 11/27/2014  . Osteoporosis   . B-complex deficiency 06/07/2014  . Recurrent falls 02/06/2014  . Multiple system atrophy ([place])   . Dysphagia, unspecified(787.20) 07/28/2013  . Lower extremity edema 09/26/2012  . Shoulder pain, left 07/20/2012  . 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 Routine health maintenance 08/26/2011  . Murmur 05/27/2011  . HYPERCHOLESTEROLEMIA  IIA 05/14/2010  . VENOUS INSUFFICIENCY, LEGS 06/21/2009  . CORONARY HEART DISEASE 06/09/2008  . Hypothyroidism 05/15/2008  . ASTHMA 05/15/2008  . Depression 11/07/2007  . 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 GERD 11/07/2007  . VAGINITIS,  ATROPHIC 11/07/2007    Alda Lea, PT 01/29/2016, 11:40 AM  [facility] [address] [place], [place], [address] Phone: [phone]   Fax:  [phone]  Name: [name] [name] MRN: [id number] Date of Birth: Jan 24, 1941

## 2016-01-30 ENCOUNTER — Ambulatory Visit: Payer: Medicare Other | Admitting: Physical Therapy

## 2016-01-30 DIAGNOSIS — R270 Ataxia, unspecified: Secondary | ICD-10-CM | POA: Diagnosis not present

## 2016-01-30 DIAGNOSIS — R269 Unspecified abnormalities of gait and mobility: Secondary | ICD-10-CM

## 2016-01-30 DIAGNOSIS — R2689 Other abnormalities of gait and mobility: Secondary | ICD-10-CM | POA: Diagnosis not present

## 2016-01-30 DIAGNOSIS — R278 Other lack of coordination: Secondary | ICD-10-CM | POA: Diagnosis not present

## 2016-01-30 DIAGNOSIS — M6289 Other specified disorders of muscle: Secondary | ICD-10-CM

## 2016-01-30 DIAGNOSIS — R29898 Other symptoms and signs involving the musculoskeletal system: Secondary | ICD-10-CM | POA: Diagnosis not present

## 2016-01-30 DIAGNOSIS — R279 Unspecified lack of coordination: Secondary | ICD-10-CM | POA: Diagnosis not present

## 2016-01-31 NOTE — Therapy (Signed)
[facility] [address] [place], [place], [address] Phone: [phone]   Fax:  [phone]  Physical Therapy Treatment  Patient Details  Name: [name] [name] MRN: [id number] Date of Birth: Jan 27, 1941 Referring Provider: Dr. Star Age  Encounter Date: 01/30/2016      PT End of Session - 01/31/16 1010    Visit Number 14   Number of Visits 17   Date for PT Re-Evaluation 02/04/16   Authorization Type UHC [facility]   Authorization Time Period 12-05-15 - 02-04-16   PT Start Time 1454   PT Stop Time 1534   PT Time Calculation (min) 40 min   Equipment Utilized During Treatment Gait belt      Past Medical History  Diagnosis Date  . GERD (gastroesophageal reflux disease)     s/p nissen  . Familial tremor     followed by Dr. Erling Cruz  . Unspecified chronic bronchitis ([place])   . Neurogenic bladder     Tannenbaum/MacDiarmid  . History of pneumonia   . Hypothyroidism   . Depression   . Asthma   . CAD (coronary artery disease)     myoview 5/08:  EF 76%, no scar, no ischemia, +ECG changes with exercise;  cath 04/08/07:  pLAD 30%, mLAD 60-70% - med tx.  Marland Kitchen HLD (hyperlipidemia)   . Multiple system atrophy [facility])     [name]  . VAGINITIS, ATROPHIC 11/07/2007  . Osteoporosis 2016    DEXA T-4.4 spine deteriorated since 2012  . Chronic cystitis     recurrent UTIs, started bactrim ppx (McDiarmid)  . Internal hemorrhoids   . Hydronephrosis, bilateral     Past Surgical History  Procedure Laterality Date  . Laparoscopic nissen fundoplication    . Cystectomy    . Tumor removal    . Tubal ligation    . Colonoscopy N/A 08/29/2013    int hemm; Inda Castle, MD  . Dexa  12/2014    T -4.4 spine  . Esophagogastroduodenoscopy N/A 01/09/2015    Procedure: ESOPHAGOGASTRODUODENOSCOPY (EGD);  Surgeon: Inda Castle, MD;  Location: [facility];  Service: Endoscopy;  Laterality: N/A;  help with transfers    There were no  vitals filed for this visit.  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 at [facility] clinic in Aug. 2013;Pt. had UTI on 04-11-15; went to ED - received catherization; states she has had increased weakness and some mild decline in mobility since this problem   Patient Stated Goals improve walking and mobility   Currently in Pain? No/denies                         [facility] Adult PT Treatment/Exercise - 01/31/16 0001    Ambulation/Gait   Ambulation/Gait Yes   Ambulation/Gait Assistance 4: Min assist;3: Mod assist   Ambulation Distance (Feet) 55 Feet  2nd rep approx. 45   Assistive device Rolling walker   Gait Pattern Wide base of support;Decreased hip/knee flexion - right;Decreased hip/knee flexion - left;Decreased stride length;Step-through pattern;Abducted- right;Decreased  trunk rotation   Ambulation Surface Level;Indoor   Gait Comments R foot freezes and inverts due to spasticity   Lumbar Exercises: Stretches   Single Knee to Chest Stretch 2 reps;20 seconds   Double Knee to Chest Stretch 2 reps;20 seconds   Lower Trunk Rotation 2 reps;30 seconds  in seated position   Piriformis Stretch 1 rep;20 seconds  both R and L sides   Lumbar Exercises: Supine   Bridge 10 reps   Knee/Hip Exercises: Supine   Other Supine Knee/Hip Exercises alternate hip/knee flexion in hooklyin gposition x 10 reps each                  PT Short Term Goals - 01/18/16 1133    PT SHORT TERM GOAL #1   Title Pt. will increase standing tolerance so she is able to report standing 5" at home with S for independence  with ADL's. (01-04-16)   Baseline varies - depending on status which varies on daily basis - 01-16-16   Status On-going   PT SHORT TERM GOAL #2   Title Amb. with RW 150' without requiring a seated rest period to demo incr. endurance.   Baseline 140' with RW on 01-16-16   Status Not Met   PT SHORT TERM GOAL #3   Title Transfer sit to stand from wheelchair with mod assist   (01-04-16)   Baseline met 01-16-16 - inconsistent at times due to fluctuations in status and performance   Status Achieved   PT SHORT TERM GOAL #4   Title Improve TUG score by at least 5 secs to demo improved functional mobiltiy.  (01-04-16)   Status Deferred           PT Long Term Goals - 12/08/15 1333    PT LONG TERM GOAL #1   Title Pt. will ambulate 240' nonstop with RW to demo incr. endurance with CGA  (02-04-16)   Time 8   Period Weeks   Status New   PT LONG TERM GOAL #2   Title Report ability to stand at least 10" at home for improved standing tolerance  (02-04-16)   Time 8   Period Weeks   Status New   PT LONG TERM GOAL #3   Title Perform sit to stand transfer from mat to RW with +1 min  assist. (02-04-16)   Baseline mod to max assist needed from wheelchair to RW   Time 8   Period Weeks   Status New   PT LONG TERM GOAL #4   Title Perform at least 15" on Nustep to demo improved activity tolerance/endurance  (02-04-16)   Time 8   Period Weeks   Status New   PT LONG TERM GOAL #5   Title Independent to direct assistance prn with stretches and exercises for HEP.  (02-04-16)               Plan - 01/31/16 1011    Clinical Impression Statement Pt continues to have significant decline in mobility due to progression of MSA disease process; amb. is becoming very difficult as well as transfers   Pt will benefit from skilled therapeutic intervention in order to improve on the following deficits Abnormal gait;Decreased coordination;Impaired flexibility;Difficulty walking;Decreased balance;Increased muscle  spasms;Impaired tone;Decreased mobility;Decreased strength;Decreased activity tolerance;Decreased range of motion   Rehab Potential Good   Clinical Impairments Affecting Rehab Potential severity of deficits and progressive neurological disease   PT Frequency 2x / week   PT Duration 8 weeks  PT Next Visit Plan cont ther ex, gait training   PT Home Exercise Plan stretches and supine strengthening exs.   Consulted and Agree with Plan of Care Patient        Problem List Patient Active Problem List   Diagnosis Date Noted  . Neurogenic bladder   . Chronic cystitis 04/18/2015  . Pedal edema 04/01/2015  . Dyspepsia 01/09/2015  . Constipation 12/14/2014  . Medicare annual wellness visit, subsequent 11/27/2014  . Advanced care planning/counseling discussion 11/27/2014  . Osteoporosis   . B-complex deficiency 06/07/2014  . Recurrent falls 02/06/2014  . Multiple system atrophy ([place])   . Dysphagia, unspecified(787.20) 07/28/2013  . Lower extremity edema 09/26/2012  . Shoulder pain, left 07/20/2012  . 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 Routine health maintenance 08/26/2011  . Murmur 05/27/2011  . HYPERCHOLESTEROLEMIA  IIA 05/14/2010  . VENOUS INSUFFICIENCY, LEGS 06/21/2009  . CORONARY HEART DISEASE 06/09/2008  . Hypothyroidism 05/15/2008  . ASTHMA 05/15/2008  . Depression 11/07/2007  . 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 GERD 11/07/2007  . VAGINITIS, ATROPHIC 11/07/2007    Alda Lea, PT 01/31/2016, 10:14 AM  [facility] [address] [place], [place], [address] Phone: [phone]   Fax:  [phone]  Name: [name] [name] MRN: [id number] Date of Birth: 10-23-1941

## 2016-02-03 ENCOUNTER — Ambulatory Visit: Payer: Medicare Other | Admitting: Physical Therapy

## 2016-02-03 DIAGNOSIS — R278 Other lack of coordination: Secondary | ICD-10-CM | POA: Diagnosis not present

## 2016-02-03 DIAGNOSIS — R269 Unspecified abnormalities of gait and mobility: Secondary | ICD-10-CM | POA: Diagnosis not present

## 2016-02-03 DIAGNOSIS — R29898 Other symptoms and signs involving the musculoskeletal system: Secondary | ICD-10-CM | POA: Diagnosis not present

## 2016-02-03 DIAGNOSIS — R270 Ataxia, unspecified: Secondary | ICD-10-CM | POA: Diagnosis not present

## 2016-02-03 DIAGNOSIS — R279 Unspecified lack of coordination: Secondary | ICD-10-CM | POA: Diagnosis not present

## 2016-02-03 DIAGNOSIS — M6289 Other specified disorders of muscle: Secondary | ICD-10-CM

## 2016-02-03 DIAGNOSIS — R2689 Other abnormalities of gait and mobility: Secondary | ICD-10-CM | POA: Diagnosis not present

## 2016-02-04 ENCOUNTER — Ambulatory Visit (INDEPENDENT_AMBULATORY_CARE_PROVIDER_SITE_OTHER): Payer: Medicare Other | Admitting: Family Medicine

## 2016-02-04 ENCOUNTER — Encounter: Payer: Self-pay | Admitting: Family Medicine

## 2016-02-04 VITALS — BP 128/70 | HR 72 | Temp 97.8°F | Wt 152.5 lb

## 2016-02-04 DIAGNOSIS — N390 Urinary tract infection, site not specified: Secondary | ICD-10-CM | POA: Diagnosis not present

## 2016-02-04 DIAGNOSIS — R600 Localized edema: Secondary | ICD-10-CM

## 2016-02-04 DIAGNOSIS — R6883 Chills (without fever): Secondary | ICD-10-CM | POA: Diagnosis not present

## 2016-02-04 DIAGNOSIS — G903 Multi-system degeneration of the autonomic nervous system: Secondary | ICD-10-CM

## 2016-02-04 DIAGNOSIS — E039 Hypothyroidism, unspecified: Secondary | ICD-10-CM

## 2016-02-04 DIAGNOSIS — R5383 Other fatigue: Secondary | ICD-10-CM | POA: Diagnosis not present

## 2016-02-04 DIAGNOSIS — E539 Vitamin B deficiency, unspecified: Secondary | ICD-10-CM | POA: Diagnosis not present

## 2016-02-04 DIAGNOSIS — G239 Degenerative disease of basal ganglia, unspecified: Secondary | ICD-10-CM

## 2016-02-04 LAB — POC URINALSYSI DIPSTICK (AUTOMATED)
Bilirubin, UA: NEGATIVE
GLUCOSE UA: NEGATIVE
KETONES UA: NEGATIVE
Nitrite, UA: NEGATIVE
Protein, UA: NEGATIVE
RBC UA: NEGATIVE
SPEC GRAV UA: 1.01
UROBILINOGEN UA: 0.2
pH, UA: 8

## 2016-02-04 NOTE — Assessment & Plan Note (Addendum)
Update TSH and free T4 - some concern for cause of symptoms endorsed today.

## 2016-02-04 NOTE — Assessment & Plan Note (Addendum)
Ongoing over last several weeks. Pt endorses chills, worried about infection. UA today with only trace leukocytos.  Check for reversible causes of fatigue.

## 2016-02-04 NOTE — Patient Instructions (Addendum)
Use OTC fungi-nail for nail fungus. Urine looked ok today. labwork today to check for thyroid trouble or infection.

## 2016-02-04 NOTE — Assessment & Plan Note (Signed)
Lungs clear, neck veins flat. No signs of CHF as cause - check BNP today.

## 2016-02-04 NOTE — Assessment & Plan Note (Signed)
With atypical parkinson's. Doubt symptoms related to deterioration of neurological condition.  Check labwork today.

## 2016-02-04 NOTE — Progress Notes (Signed)
Pre visit review using our clinic review tool, if applicable. No additional management support is needed unless otherwise documented below in the visit note. 

## 2016-02-04 NOTE — Progress Notes (Signed)
BP 128/70 mmHg  Pulse 72  Temp(Src) 97.8 F (36.6 C) (Oral)  Wt 152 lb 8 oz (69.174 kg)   CC: fatigue, cold feeling  Subjective:    Patient ID: [name] [name], female    DOB: February 22, 1941, 75 y.o.   MRN: [id number]  HPI: [name] [name] is a 75 y.o. female presenting on 02/04/2016 for Fatigue   Worsening fatigue and muscle spasms over last several weeks. Cold intolerance with occasional chills. Noticing worsening leg swelling as well. Recently saw neurology for worsening cramping/tremors. Received B12 shot. Baclofen was restarted which helped. Did not think this was MSA related.   Denies cough, congestion, body aches. No abd pain, urinary symptoms. No fevers. Denies palpitations, dizziness. No dyspnea.   Recurrent cystitis well controlled on myrbetriq, cranberry tablets and daily ppx abx (unsure type).   Relevant past medical, surgical, family and social history reviewed and updated as indicated. Interim medical history since our last visit reviewed. Allergies and medications reviewed and updated. Current Outpatient Prescriptions on File Prior to Visit  Medication Sig  . albuterol (PROAIR HFA) 108 (90 BASE) MCG/ACT inhaler Inhale 2 puffs into the lungs every 6 (six) hours as needed for wheezing or shortness of breath. Reported on 01/22/2016  . alum & mag hydroxide-simeth (MYLANTA) 557-322-02 MG/5ML suspension Take 15 mLs by mouth every 6 (six) hours as needed for indigestion or heartburn.  Marland Kitchen aspirin (ASPIRIN EC) 81 MG EC tablet Take 81 mg by mouth 2 (two) times a week. Swallow whole. Take at bedtime  . baclofen (LIORESAL) 10 MG tablet Take 0.5 tablets (5 mg total) by mouth 3 (three) times daily.  . beclomethasone (QVAR) 80 MCG/ACT inhaler Inhale 1 puff into the lungs daily as needed (wheezing). Reported on 01/22/2016  . Calcium Carbonate-Vitamin D (CALCIUM-VITAMIN D) 500-200 MG-UNIT per tablet Take 1 tablet by mouth daily. Reported on 01/22/2016  . clonazePAM (KLONOPIN) 0.5 MG tablet TAKE  1 TO 2 TABLETS BY MOUTH EVERY DAY AS NEEDED  . Cranberry (ELLURA) 200 MG CAPS Take 1 capsule by mouth daily.  . CRESTOR 20 MG tablet TAKE ONE-HALF TABLET BY MOUTH ONCE EVERY EVENING  . cyanocobalamin (,VITAMIN B-12,) 1000 MCG/ML injection Inject 1,000 mcg into the muscle every 30 (thirty) days.    Marland Kitchen dexlansoprazole (DEXILANT) 60 MG capsule Take 60 mg by mouth as needed.  . famotidine (PEPCID) 40 MG tablet TAKE 1 TABLET (40 MG TOTAL) BY MOUTH 2 (TWO) TIMES DAILY AS NEEDED FOR HEARTBURN.  . fluocinolone (VANOS) 0.01 % cream Apply 1 application topically 2 (two) times daily as needed (itching).   . furosemide (LASIX) 20 MG tablet Take 1 tablet (20 mg total) by mouth daily as needed for fluid or edema.  . hydrOXYzine (ATARAX/VISTARIL) 10 MG tablet Take 10 mg by mouth 3 (three) times daily as needed for itching. Reported on 01/22/2016  . hyoscyamine (LEVSIN SL) 0.125 MG SL tablet PLACE 2 TABLETS (0.25 MG TOTAL) UNDER THE TONGUE EVERY 4 (FOUR) HOURS AS NEEDED (SPASMS).  . ibandronate (BONIVA) 3 MG/3ML SOLN injection Inject 3 mg into the vein once. Reported on 01/22/2016  . isosorbide mononitrate (IMDUR) 30 MG 24 hr tablet TAKE 1/2 TABLET BY MOUTH DAILY  . loratadine (CLARITIN) 10 MG tablet Take 10 mg by mouth 2 (two) times daily as needed (allergies). Reported on 01/22/2016  . mometasone (NASONEX) 50 MCG/ACT nasal spray Place 2 sprays into the nose daily as needed (allergies). Reported on 01/22/2016  . nitroGLYCERIN (NITROSTAT) 0.4 MG SL tablet Place  1 tablet (0.4 mg total) under the tongue every 5 (five) minutes as needed (chest pain).  . polyethylene glycol powder (GLYCOLAX/MIRALAX) powder Take 17 g by mouth daily as needed for moderate constipation.  . propranolol (INDERAL) 10 MG tablet TAKE 1 TABLET BY MOUTH 3 TIMES DAILY AS NEEDED (TREMORS).  Marland Kitchen sertraline (ZOLOFT) 25 MG tablet Take 1 tablet (25 mg total) by mouth daily.  Marland Kitchen SYNTHROID 112 MCG tablet TAKE 1 TABLET (112 MCG TOTAL) BY MOUTH DAILY BEFORE  BREAKFAST.  Marland Kitchen triamcinolone cream (KENALOG) 0.1 % Apply 1 application topically daily. Reported on 01/22/2016   No current facility-administered medications on file prior to visit.    Review of Systems Per HPI unless specifically indicated in ROS section     Objective:    BP 128/70 mmHg  Pulse 72  Temp(Src) 97.8 F (36.6 C) (Oral)  Wt 152 lb 8 oz (69.174 kg)  Wt Readings from Last 3 Encounters:  02/04/16 152 lb 8 oz (69.174 kg)  04/03/15 145 lb (65.772 kg)  01/11/15 150 lb (68.04 kg)    Physical Exam  Constitutional: She appears well-developed and well-nourished. No distress.  HENT:  Mouth/Throat: Oropharynx is clear and moist. No oropharyngeal exudate.  Neck: No thyromegaly present.  Cardiovascular: Normal rate, regular rhythm, normal heart sounds and intact distal pulses.   No murmur heard. Pulmonary/Chest: Effort normal and breath sounds normal. No respiratory distress. She has no wheezes. She has no rales.  Abdominal: Soft. Bowel sounds are normal. She exhibits no distension and no mass. There is no tenderness. There is no rebound and no guarding.  Musculoskeletal: She exhibits edema (tight 1+ pitting edema).  Skin: Skin is warm and dry. No rash noted.  Psychiatric: She has a normal mood and affect.  Nursing note and vitals reviewed.  Results for orders placed or performed in visit on 02/04/16  POCT Urinalysis Dipstick (Automated)  Result Value Ref Range   Color, UA Straw    Clarity, UA Hazy    Glucose, UA Negative    Bilirubin, UA Negative    Ketones, UA Negative    Spec Grav, UA 1.010    Blood, UA Negative    pH, UA 8.0    Protein, UA Negative    Urobilinogen, UA 0.2    Nitrite, UA Negative    Leukocytes, UA Trace (A) Negative   Lab Results  Component Value Date   TSH 0.88 04/01/2015   No results found for: [id number]     Assessment & Plan:   Problem List Items Addressed This Visit    Pedal edema    Lungs clear, neck veins flat. No signs of CHF as  cause - check BNP today.      Relevant Orders   Brain natriuretic peptide   Other fatigue - Primary    Ongoing over last several weeks. Pt endorses chills, worried about infection. UA today with only trace leukocytos.  Check for reversible causes of fatigue.      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 Doubt symptoms related to deterioration of neurological condition.  Check labwork today.      Hypothyroidism    Update TSH and free T4 - some concern for cause of symptoms endorsed today.      Relevant Orders   TSH   T4, free  B-complex deficiency   Relevant Orders   Vitamin B12    Other Visit Diagnoses    Recurrent UTI        Relevant Orders    POCT Urinalysis Dipstick (Automated) (Completed)    Chills        Relevant Orders    Sedimentation rate        Follow up plan: Return if symptoms worsen or fail to improve.

## 2016-02-05 ENCOUNTER — Encounter: Payer: Self-pay | Admitting: Physical Therapy

## 2016-02-05 LAB — COMPREHENSIVE METABOLIC PANEL
ALBUMIN: 4.3 g/dL (ref 3.5–5.2)
ALK PHOS: 120 U/L — AB (ref 39–117)
ALT: 15 U/L (ref 0–35)
AST: 17 U/L (ref 0–37)
BUN: 16 mg/dL (ref 6–23)
CO2: 28 mEq/L (ref 19–32)
Calcium: 9.5 mg/dL (ref 8.4–10.5)
Chloride: 107 mEq/L (ref 96–112)
Creatinine, Ser: 0.68 mg/dL (ref 0.40–1.20)
GFR: 89.47 mL/min (ref >60.00)
Glucose, Bld: 102 mg/dL — ABNORMAL HIGH (ref 70–99)
POTASSIUM: 4.1 meq/L (ref 3.5–5.1)
Sodium: 141 mEq/L (ref 135–145)
TOTAL PROTEIN: 6.8 g/dL (ref 6.0–8.3)
Total Bilirubin: 0.4 mg/dL (ref 0.2–1.2)

## 2016-02-05 LAB — CBC WITH DIFFERENTIAL/PLATELET
BASOS PCT: 1.1 % (ref 0.0–3.0)
Basophils Absolute: 0.1 10*3/uL (ref 0.0–0.1)
EOS PCT: 1 % (ref 0.0–5.0)
Eosinophils Absolute: 0.1 10*3/uL (ref 0.0–0.7)
HEMATOCRIT: 39.5 % (ref 36.0–46.0)
HEMOGLOBIN: 13.4 g/dL (ref 12.0–15.0)
Lymphocytes Relative: 34.4 % (ref 12.0–46.0)
Lymphs Abs: 1.8 10*3/uL (ref 0.7–4.0)
MCHC: 34 g/dL (ref 30.0–36.0)
MCV: 87.6 fl (ref 78.0–100.0)
MONO ABS: 0.4 10*3/uL (ref 0.1–1.0)
MONOS PCT: 7.7 % (ref 3.0–12.0)
Neutro Abs: 3 10*3/uL (ref 1.4–7.7)
Neutrophils Relative %: 55.8 % (ref 43.0–77.0)
Platelets: 260 10*3/uL (ref 150.0–400.0)
RBC: 4.51 Mil/uL (ref 3.87–5.11)
RDW: 14 % (ref 11.5–15.5)
WBC: 5.4 10*3/uL (ref 4.0–10.5)

## 2016-02-05 LAB — SEDIMENTATION RATE: SED RATE: 10 mm/h (ref 0–22)

## 2016-02-05 LAB — T4, FREE: Free T4: 1.42 ng/dL (ref 0.60–1.60)

## 2016-02-05 LAB — BRAIN NATRIURETIC PEPTIDE: Pro B Natriuretic peptide (BNP): 73 pg/mL (ref 0.0–100.0)

## 2016-02-05 LAB — TSH: TSH: 0.64 u[IU]/mL (ref 0.35–4.50)

## 2016-02-05 LAB — VITAMIN B12: Vitamin B-12: 304 pg/mL (ref 211–911)

## 2016-02-05 NOTE — Therapy (Signed)
[facility] [address] [place], [place], [address] Phone: [phone]   Fax:  [phone]  Physical Therapy Treatment  Patient Details  Name: [name] [name] MRN: [id number] Date of Birth: 05/17/1941 Referring Provider: Dr. Saima [name]  Encounter Date: 02/03/2016      PT End of Session - 02/05/16 1024    Visit Number 15   Number of Visits 17   Date for PT Re-Evaluation 02/04/16   Authorization Type UHC MCR   Authorization Time Period 12-05-15 - 02-04-16   PT Start Time 1402   PT Stop Time 1446   PT Time Calculation (min) 44 min   Equipment Utilized During Treatment Gait belt      Past Medical History  Diagnosis Date  . GERD (gastroesophageal reflux disease)     s/p nissen  . Familial tremor     followed by Dr. Love  . Unspecified chronic bronchitis (HCC)   . Neurogenic bladder     Tannenbaum/MacDiarmid  . History of pneumonia   . Hypothyroidism   . Depression   . Asthma   . CAD (coronary artery disease)     myoview 5/08:  EF 76%, no scar, no ischemia, +ECG changes with exercise;  cath 04/08/07:  pLAD 30%, mLAD 60-70% - med tx.  . HLD (hyperlipidemia)   . Multiple system atrophy (HCC)     [name]  . VAGINITIS, ATROPHIC 11/07/2007  . Osteoporosis 2016    DEXA T-4.4 spine deteriorated since 2012  . Chronic cystitis     recurrent UTIs, started bactrim ppx (McDiarmid)  . Internal hemorrhoids   . Hydronephrosis, bilateral     Past Surgical History  Procedure Laterality Date  . Laparoscopic nissen fundoplication    . Cystectomy    . Tumor removal    . Tubal ligation    . Colonoscopy N/A 08/29/2013    int hemm; Robert D Kaplan, MD  . Dexa  12/2014    T -4.4 spine  . Esophagogastroduodenoscopy N/A 01/09/2015    Procedure: ESOPHAGOGASTRODUODENOSCOPY (EGD);  Surgeon: Robert D Kaplan, MD;  Location: [facility];  Service: Endoscopy;  Laterality: N/A;  help with transfers    There were no  vitals filed for this visit.  Visit Diagnosis:  Abnormality of gait  Incoordination of extremity  Abnormal increased muscle tone      Subjective Assessment - 02/05/16 1019    Subjective Pt states she feels better today than she did last week - is going to see her PCP on Tues to see if she has an infection somewhere that is causing her to have more difficulty with mobility   Patient is accompained by: Family member   Pertinent History Multiple Systems atrophy diagnosed in May 2014 with initial appt. at [name] in Aug[facility] 2013;Pt. had UTI on 04-11-15; went to ED - received catherization; states she has had increased weakness and some mild decline in mobility since this problem   Patient Stated Goals improve walking and mobility   Currently in Pain? No/denies                         [facility] Adult PT Treatment/Exercise - 02/05/16 0001    Ambulation/Gait   Ambulation/Gait Yes   Ambulation/Gait Assistance 3: Mod assist   Ambulation Distance (Feet) 100 Feet  20' 1st rep wtih RW   Assistive device Rolling walker   Gait Pattern Wide base of support;Decreased hip/knee flexion - right;Decreased   hip/knee flexion - left;Decreased stride length;Step-through pattern;Abducted- right;Decreased trunk rotation   Ambulation Surface Level;Indoor   Gait Comments R foot freezes and supinates due to incr. tone   Lumbar Exercises: Stretches   Single Knee to Chest Stretch 2 reps;20 seconds   Double Knee to Chest Stretch 2 reps;20 seconds   Lower Trunk Rotation 2 reps;30 seconds  in seated position   Piriformis Stretch 1 rep;20 seconds  both R and L sides   Knee/Hip Exercises: Aerobic   Stationary Bike Sci[employer] level 1.5 x 15"  performed from wheelchair - no charge as unsupervised    Bridging - 2 sets 10 reps       [facility]) - 02/05/16 1023    PWR! Up 10 reps   PWR! Twist 10 reps     Pt requires mod to max assist with sit to stand transfer from wheelchair to RW          PT  Short Term Goals - 01/18/16 1133    PT SHORT TERM GOAL #1   Title Pt. will increase standing tolerance so she is able to report standing 5" at home with S for independence with ADL's. (01-04-16)   Baseline varies - depending on status which varies on daily basis - 01-16-16   Status On-going   PT SHORT TERM GOAL #2   Title Amb. with RW 150' without requiring a seated rest period to demo incr. endurance.   Baseline 140' with RW on 01-16-16   Status Not Met   PT SHORT TERM GOAL #3   Title Transfer sit to stand from wheelchair with mod assist   (01-04-16)   Baseline met 01-16-16 - inconsistent at times due to fluctuations in status and performance   Status Achieved   PT SHORT TERM GOAL #4   Title Improve TUG score by at least 5 secs to demo improved functional mobiltiy.  (01-04-16)   Status Deferred           PT Long Term Goals - 12/08/15 1333    PT LONG TERM GOAL #1   Title Pt. will ambulate 240' nonstop with RW to demo incr. endurance with CGA  (02-04-16)   Time 8   Period Weeks   Status New   PT LONG TERM GOAL #2   Title Report ability to stand at least 10" at home for improved standing tolerance  (02-04-16)   Time 8   Period Weeks   Status New   PT LONG TERM GOAL #3   Title Perform sit to stand transfer from mat to RW with +1 min  assist. (02-04-16)   Baseline mod to max assist needed from wheelchair to RW   Time 8   Period Weeks   Status New   PT LONG TERM GOAL #4   Title Perform at least 15" on Nustep to demo improved activity tolerance/endurance  (02-04-16)   Time 8   Period Weeks   Status New   PT LONG TERM GOAL #5   Title Independent to direct assistance prn with stretches and exercises for HEP.  (02-04-16)               Plan - 02/05/16 1025    Clinical Impression Statement Pt 's mobility improved today compared to status last week; pt states she feels better than she did last week. Pt continues to have dystonia in RLE and rigidity in trunk due to spasticity   Pt will  benefit from skilled therapeutic intervention in order to  improve on the following deficits Abnormal gait;Decreased coordination;Impaired flexibility;Difficulty walking;Decreased balance;Increased muscle spasms;Impaired tone;Decreased mobility;Decreased strength;Decreased activity tolerance;Decreased range of motion   Rehab Potential Good   Clinical Impairments Affecting Rehab Potential severity of deficits and progressive neurological disease   PT Frequency 2x / week   PT Duration 8 weeks   PT Treatment/Interventions ADLs/Self Care Home Management;Therapeutic activities;Patient/family education;Therapeutic exercise;Gait training;Balance training;Stair training;Neuromuscular re-education;Functional mobility training   PT Next Visit Plan check LTG's - renew with goals to be downgraded   PT Home Exercise Plan stretches and supine strengthening exs.   Consulted and Agree with Plan of Care Patient        Problem List Patient Active Problem List   Diagnosis Date Noted  . Other fatigue 02/04/2016  . Neurogenic bladder   . Chronic cystitis 04/18/2015  . Pedal edema 04/01/2015  . Dyspepsia 01/09/2015  . Constipation 12/14/2014  . Medicare annual wellness visit, subsequent 11/27/2014  . Advanced care planning/counseling discussion 11/27/2014  . Osteoporosis   . B-complex deficiency 06/07/2014  . Recurrent falls 02/06/2014  . Multiple system atrophy (HCC)   . Dysphagia, unspecified(787.20) 07/28/2013  . Shoulder pain, left 07/20/2012  . 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 Routine health maintenance 08/26/2011  . Murmur 05/27/2011  . HYPERCHOLESTEROLEMIA  IIA 05/14/2010  . VENOUS INSUFFICIENCY, LEGS 06/21/2009  . CORONARY HEART DISEASE 06/09/2008  . Hypothyroidism 05/15/2008  . ASTHMA 05/15/2008  . Depression 11/07/2007  . 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 GERD 11/07/2007  . VAGINITIS, ATROPHIC 11/07/2007    ,  Suzanne, PT 02/05/2016, 10:30 AM  [facility] [address] [place], [place], [address] Phone: [phone]   Fax:  [phone]  Name: [name] [name] MRN: [id number] Date of Birth: 12/05/1940     

## 2016-02-06 ENCOUNTER — Ambulatory Visit: Payer: Medicare Other | Admitting: Physical Therapy

## 2016-02-06 DIAGNOSIS — M6289 Other specified disorders of muscle: Secondary | ICD-10-CM | POA: Diagnosis not present

## 2016-02-06 DIAGNOSIS — R270 Ataxia, unspecified: Secondary | ICD-10-CM

## 2016-02-06 DIAGNOSIS — R29898 Other symptoms and signs involving the musculoskeletal system: Secondary | ICD-10-CM | POA: Diagnosis not present

## 2016-02-06 DIAGNOSIS — R2689 Other abnormalities of gait and mobility: Secondary | ICD-10-CM

## 2016-02-06 DIAGNOSIS — R278 Other lack of coordination: Secondary | ICD-10-CM | POA: Diagnosis not present

## 2016-02-06 DIAGNOSIS — R279 Unspecified lack of coordination: Secondary | ICD-10-CM | POA: Diagnosis not present

## 2016-02-06 DIAGNOSIS — R269 Unspecified abnormalities of gait and mobility: Secondary | ICD-10-CM | POA: Diagnosis not present

## 2016-02-07 ENCOUNTER — Encounter: Payer: Self-pay | Admitting: *Deleted

## 2016-02-07 NOTE — Therapy (Signed)
[facility] [address] [place], [place], [address] Phone: [phone]   Fax:  [phone]  Physical Therapy Treatment  Patient Details  Name: [name] [name] MRN: [id number] Date of Birth: Jan 30, 1941 Referring Provider: Dr. Star Age  Encounter Date: 02/06/2016      PT End of Session - 02/07/16 1131    Visit Number 16   Number of Visits 17   Date for PT Re-Evaluation 02/04/16   Authorization Type UHC [facility]   Authorization Time Period 02-06-16 - 04-06-16   PT Start Time 1403   PT Stop Time 1448   PT Time Calculation (min) 45 min   Equipment Utilized During Treatment Gait belt      Past Medical History  Diagnosis Date  . GERD (gastroesophageal reflux disease)     s/p nissen  . Familial tremor     followed by Dr. Erling Cruz  . Unspecified chronic bronchitis ([place])   . Neurogenic bladder     Tannenbaum/MacDiarmid  . History of pneumonia   . Hypothyroidism   . Depression   . Asthma   . CAD (coronary artery disease)     myoview 5/08:  EF 76%, no scar, no ischemia, +ECG changes with exercise;  cath 04/08/07:  pLAD 30%, mLAD 60-70% - med tx.  Marland Kitchen HLD (hyperlipidemia)   . Multiple system atrophy [facility])     [name]  . VAGINITIS, ATROPHIC 11/07/2007  . Osteoporosis 2016    DEXA T-4.4 spine deteriorated since 2012  . Chronic cystitis     recurrent UTIs, started bactrim ppx (McDiarmid)  . Internal hemorrhoids   . Hydronephrosis, bilateral     Past Surgical History  Procedure Laterality Date  . Laparoscopic nissen fundoplication    . Cystectomy    . Tumor removal    . Tubal ligation    . Colonoscopy N/A 08/29/2013    int hemm; Inda Castle, MD  . Dexa  12/2014    T -4.4 spine  . Esophagogastroduodenoscopy N/A 01/09/2015    Procedure: ESOPHAGOGASTRODUODENOSCOPY (EGD);  Surgeon: Inda Castle, MD;  Location: [facility];  Service: Endoscopy;  Laterality: N/A;  help with transfers    There were no  vitals filed for this visit.  Visit Diagnosis:  Ataxia - Plan: PT plan of care cert/re-cert  Functional gait abnormality - Plan: PT plan of care cert/re-cert  Unspecified lack of coordination - Plan: PT plan of care cert/re-cert      Subjective Assessment - 02/07/16 1127    Subjective Pt states she had bloodwork done yesterday - at Dr. Synthia Innocent office - is now waiting on results - to determine if she has an infection or if decline is due to [place]   Pertinent History Multiple Systems atrophy diagnosed in May 2014 with initial appt. at [facility] clinic in Aug. 2013;Pt. had UTI on 04-11-15; went to ED - received catherization; states she has had increased weakness and some mild decline in mobility since this problem   Patient Stated Goals improve walking and mobility   Currently in Pain? No/denies                         [facility] Adult PT Treatment/Exercise - 02/07/16 0001    Transfers   Transfers Sit to Stand;Stand to Sit   Sit to Stand 3: Mod assist   Comments used swivel transfer disc to educate pt in available DME to assist with transfers   Ambulation/Gait  Ambulation/Gait Yes   Ambulation/Gait Assistance 3: Mod assist;4: Min assist   Ambulation Distance (Feet) 120 Feet   Assistive device Rolling walker   Gait Pattern Wide base of support;Decreased hip/knee flexion - right;Decreased hip/knee flexion - left;Decreased stride length;Step-through pattern;Abducted- right;Decreased trunk rotation   Ambulation Surface Level;Indoor   Gait Comments R foot freezes and supinates due to incr. tone   Lumbar Exercises: Stretches   Single Knee to Chest Stretch 2 reps;20 seconds   Lower Trunk Rotation 2 reps;30 seconds  in seated position   Piriformis Stretch 1 rep;20 seconds  both R and L sides   Lumbar Exercises: Supine   Bridge 10 reps                PT Education - 02/07/16 1131    Education provided Yes   Education Details transfer disc   Person(s) Educated Patient    Methods Explanation;Demonstration   Comprehension Verbalized understanding          PT Short Term Goals - 01/18/16 1133    PT SHORT TERM GOAL #1   Title Pt. will increase standing tolerance so she is able to report standing 5" at home with S for independence with ADL's. (01-04-16)   Baseline varies - depending on status which varies on daily basis - 01-16-16   Status On-going   PT SHORT TERM GOAL #2   Title Amb. with RW 150' without requiring a seated rest period to demo incr. endurance.   Baseline 140' with RW on 01-16-16   Status Not Met   PT SHORT TERM GOAL #3   Title Transfer sit to stand from wheelchair with mod assist   (01-04-16)   Baseline met 01-16-16 - inconsistent at times due to fluctuations in status and performance   Status Achieved   PT SHORT TERM GOAL #4   Title Improve TUG score by at least 5 secs to demo improved functional mobiltiy.  (01-04-16)   Status Deferred           PT Long Term Goals - 02/07/16 1132    PT LONG TERM GOAL #1   Title Pt. will ambulate 240' nonstop with RW to demo incr. endurance with CGA  (02-04-16)   Baseline 120' with RW with min to mod assist   Status Not Met   PT LONG TERM GOAL #2   Title Report ability to stand at least 10" at home for improved standing tolerance  (02-04-16)   Baseline 1"-2" reported - with assist needed due to balance deficits   Status Not Met   PT LONG TERM GOAL #3   Title Perform sit to stand transfer from mat to RW with +1 min  assist. (02-04-16)   Baseline mod assist needed    Status Not Met   PT LONG TERM GOAL #4   Title Perform at least 15" on Nustep to demo improved activity tolerance/endurance  (02-04-16)   Baseline met 02-04-16   Status Achieved   PT LONG TERM GOAL #5   Title Independent to direct assistance prn with stretches and exercises for HEP.  (02-04-16)   Baseline met 02-04-16   Status Achieved   Additional Long Term Goals   Additional Long Term Goals Yes   PT LONG TERM GOAL #8   Title Pt will  verbalize understanding of use/option of swivel transfer disc to assist ease with transfers at home.  (03-08-16)   Time 4   Period Weeks   Status New   PT LONG TERM   GOAL  #9   TITLE Pt will amb. 120' with RW with min assist and will demo technique to recover from freezing episode with RLE.  (03-08-16)   Baseline 120' with mod to min assist   Time 4   Period Weeks   Status New               Problem List Patient Active Problem List   Diagnosis Date Noted  . Other fatigue 02/04/2016  . Neurogenic bladder   . Chronic cystitis 04/18/2015  . Pedal edema 04/01/2015  . Dyspepsia 01/09/2015  . Constipation 12/14/2014  . Medicare annual wellness visit, subsequent 11/27/2014  . Advanced care planning/counseling discussion 11/27/2014  . Osteoporosis   . B-complex deficiency 06/07/2014  . Recurrent falls 02/06/2014  . Multiple system atrophy (HCC)   . Dysphagia, unspecified(787.20) 07/28/2013  . Shoulder pain, left 07/20/2012  . 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 Routine health maintenance 08/26/2011  . Murmur 05/27/2011  . HYPERCHOLESTEROLEMIA  IIA 05/14/2010  . VENOUS INSUFFICIENCY, LEGS 06/21/2009  . CORONARY HEART DISEASE 06/09/2008  . Hypothyroidism 05/15/2008  . ASTHMA 05/15/2008  . Depression 11/07/2007  . 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 GERD 11/07/2007  . VAGINITIS, ATROPHIC 11/07/2007    ,  Suzanne, PT 02/07/2016, 11:51 AM  [facility] [address] [place], [place], [address] Phone: [phone]   Fax:  [phone]  Name: Elley F Asa MRN: [id number] Date of Birth: 04/18/1941     

## 2016-02-07 NOTE — Patient Instructions (Signed)
Educated pt in swivel transfer disc to have to assist caregiver in ease of transfers at home

## 2016-02-10 ENCOUNTER — Ambulatory Visit: Payer: Self-pay | Admitting: Physical Therapy

## 2016-02-11 ENCOUNTER — Ambulatory Visit: Payer: Medicare Other | Attending: Family Medicine | Admitting: Physical Therapy

## 2016-02-11 DIAGNOSIS — R2681 Unsteadiness on feet: Secondary | ICD-10-CM | POA: Diagnosis not present

## 2016-02-11 DIAGNOSIS — R262 Difficulty in walking, not elsewhere classified: Secondary | ICD-10-CM | POA: Diagnosis not present

## 2016-02-11 DIAGNOSIS — R29818 Other symptoms and signs involving the nervous system: Secondary | ICD-10-CM | POA: Insufficient documentation

## 2016-02-11 DIAGNOSIS — R252 Cramp and spasm: Secondary | ICD-10-CM

## 2016-02-11 DIAGNOSIS — R2689 Other abnormalities of gait and mobility: Secondary | ICD-10-CM

## 2016-02-12 NOTE — Therapy (Signed)
[facility] [address] [place], [place], [address] Phone: [phone]   Fax:  [phone]  Physical Therapy Treatment  Patient Details  Name: [name] [name] MRN: [id number] Date of Birth: 1941/03/24 Referring Provider: Dr. Star Age  Encounter Date: 02/11/2016      PT End of Session - 02/12/16 1641    Visit Number 17   Number of Visits 24   Date for PT Re-Evaluation 03/06/16   Authorization Type UHC MCR   Authorization Time Period 02-06-16 - 04-06-16   PT Start Time 1447   PT Stop Time 1536   PT Time Calculation (min) 49 min   Equipment Utilized During Treatment Gait belt      Past Medical History  Diagnosis Date  . GERD (gastroesophageal reflux disease)     s/p nissen  . Familial tremor     followed by Dr. Erling Cruz  . Unspecified chronic bronchitis ([place])   . Neurogenic bladder     Tannenbaum/MacDiarmid  . History of pneumonia   . Hypothyroidism   . Depression   . Asthma   . CAD (coronary artery disease)     myoview 5/08:  EF 76%, no scar, no ischemia, +ECG changes with exercise;  cath 04/08/07:  pLAD 30%, mLAD 60-70% - med tx.  Marland Kitchen HLD (hyperlipidemia)   . Multiple system atrophy [facility])     [name]  . VAGINITIS, ATROPHIC 11/07/2007  . Osteoporosis 2016    DEXA T-4.4 spine deteriorated since 2012  . Chronic cystitis     recurrent UTIs, started bactrim ppx (McDiarmid)  . Internal hemorrhoids   . Hydronephrosis, bilateral     Past Surgical History  Procedure Laterality Date  . Laparoscopic nissen fundoplication    . Cystectomy    . Tumor removal    . Tubal ligation    . Colonoscopy N/A 08/29/2013    int hemm; Inda Castle, MD  . Dexa  12/2014    T -4.4 spine  . Esophagogastroduodenoscopy N/A 01/09/2015    Procedure: ESOPHAGOGASTRODUODENOSCOPY (EGD);  Surgeon: Inda Castle, MD;  Location: [facility];  Service: Endoscopy;  Laterality: N/A;  help with transfers    There were no  vitals filed for this visit.  Visit Diagnosis:  Difficulty in walking, not elsewhere classified  Cramp and spasm  Unsteadiness on feet      Subjective Assessment - 02/12/16 1638    Subjective Pt states she got the results of her bloodwork - all came back OK so symptoms must be result of the MSA disease progression   Patient is accompained by: Family member   Pertinent History Multiple Systems atrophy diagnosed in May 2014 with initial appt. at [facility] clinic in Aug. 2013;Pt. had UTI on 04-11-15; went to ED - received catherization; states she has had increased weakness and some mild decline in mobility since this problem   Patient Stated Goals improve walking and mobility   Currently in Pain? No/denies                         [facility] Adult PT Treatment/Exercise - 02/12/16 0001    Ambulation/Gait   Ambulation/Gait Yes   Ambulation/Gait Assistance 4: Min assist   Ambulation/Gait Assistance Details cues to stay close inside walker and to stand erect   Ambulation Distance (Feet) 160 Feet   Assistive device Rolling walker   Gait Pattern Wide base of support;Decreased hip/knee flexion - right;Decreased hip/knee flexion -  left;Decreased stride length;Step-through pattern;Abducted- right;Decreased trunk rotation   Ambulation Surface Level;Indoor   Lumbar Exercises: Stretches   Single Knee to Chest Stretch 2 reps;20 seconds   Double Knee to Chest Stretch 2 reps;20 seconds   Lower Trunk Rotation 2 reps;30 seconds  in seated position   Piriformis Stretch 1 rep;20 seconds  both R and L sides   Lumbar Exercises: Supine   Bridge 10 reps      Trunk rotation stretch in seated position - both hands on R side and then on L side 30 sec hold with tactile cues for correct positioning For trunk musc. Stretch Sitting to leaning down on each forearm - other arm reaching overhead for trunk musc. Stretch - 30 sec hold each side            PT Short Term Goals - 01/18/16 1133    PT  SHORT TERM GOAL #1   Title Pt. will increase standing tolerance so she is able to report standing 5" at home with S for independence with ADL's. (01-04-16)   Baseline varies - depending on status which varies on daily basis - 01-16-16   Status On-going   PT SHORT TERM GOAL #2   Title Amb. with RW 150' without requiring a seated rest period to demo incr. endurance.   Baseline 140' with RW on 01-16-16   Status Not Met   PT SHORT TERM GOAL #3   Title Transfer sit to stand from wheelchair with mod assist   (01-04-16)   Baseline met 01-16-16 - inconsistent at times due to fluctuations in status and performance   Status Achieved   PT SHORT TERM GOAL #4   Title Improve TUG score by at least 5 secs to demo improved functional mobiltiy.  (01-04-16)   Status Deferred           PT Long Term Goals - 02/07/16 1132    PT LONG TERM GOAL #1   Title Pt. will ambulate 240' nonstop with RW to demo incr. endurance with CGA  (02-04-16)   Baseline 120' with RW with min to mod assist   Status Not Met   PT LONG TERM GOAL #2   Title Report ability to stand at least 10" at home for improved standing tolerance  (02-04-16)   Baseline 1"-2" reported - with assist needed due to balance deficits   Status Not Met   PT LONG TERM GOAL #3   Title Perform sit to stand transfer from mat to RW with +1 min  assist. (02-04-16)   Baseline mod assist needed    Status Not Met   PT LONG TERM GOAL #4   Title Perform at least 15" on Nustep to demo improved activity tolerance/endurance  (02-04-16)   Baseline met 02-04-16   Status Achieved   PT LONG TERM GOAL #5   Title Independent to direct assistance prn with stretches and exercises for HEP.  (02-04-16)   Baseline met 02-04-16   Status Achieved   Additional Long Term Goals   Additional Long Term Goals Yes   PT LONG TERM GOAL #8   Title Pt will verbalize understanding of use/option of swivel transfer disc to assist ease with transfers at home.  (03-08-16)   Time 4   Period Weeks    Status New   PT LONG TERM GOAL  #9   TITLE Pt will amb. 120' with RW with min assist and will demo technique to recover from freezing episode with RLE.  (03-08-16)  Baseline 120' with mod to min assist   Time 4   Period Weeks   Status New               Plan - 02/12/16 1642    Clinical Impression Statement Pt did much better walking today with less freezing episodes occurring; pt able to advance RLE easier in swing phase   Pt will benefit from skilled therapeutic intervention in order to improve on the following deficits Abnormal gait;Decreased coordination;Impaired flexibility;Difficulty walking;Decreased balance;Increased muscle spasms;Impaired tone;Decreased mobility;Decreased strength;Decreased activity tolerance;Decreased range of motion   Rehab Potential Good   Clinical Impairments Affecting Rehab Potential severity of deficits and progressive neurological disease   PT Frequency 2x / week   PT Duration 4 weeks   PT Treatment/Interventions ADLs/Self Care Home Management;Therapeutic activities;Patient/family education;Therapeutic exercise;Gait training;Balance training;Stair training;Neuromuscular re-education;Functional mobility training   PT Next Visit Plan cont gait and balance training   PT Home Exercise Plan stretches and supine strengthening exs.   Consulted and Agree with Plan of Care Patient        Problem List Patient Active Problem List   Diagnosis Date Noted  . Other fatigue 02/04/2016  . Neurogenic bladder   . Chronic cystitis 04/18/2015  . Pedal edema 04/01/2015  . Dyspepsia 01/09/2015  . Constipation 12/14/2014  . Medicare annual wellness visit, subsequent 11/27/2014  . Advanced care planning/counseling discussion 11/27/2014  . Osteoporosis   . B-complex deficiency 06/07/2014  . Recurrent falls 02/06/2014  . Multiple system atrophy ([place])   . Dysphagia, unspecified(787.20) 07/28/2013  . Shoulder pain, left 07/20/2012  . Atypical Parkinsonism ([place])  07/20/2012  . Routine health maintenance 08/26/2011  . Murmur 05/27/2011  . HYPERCHOLESTEROLEMIA  IIA 05/14/2010  . VENOUS INSUFFICIENCY, LEGS 06/21/2009  . CORONARY HEART DISEASE 06/09/2008  . Hypothyroidism 05/15/2008  . ASTHMA 05/15/2008  . Depression 11/07/2007  . 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 GERD 11/07/2007  . VAGINITIS, ATROPHIC 11/07/2007    Alda Lea, PT 02/12/2016, 4:48 PM  [facility] [address] [place], [place], [address] Phone: [phone]   Fax:  [phone]  Name: [name] [name] MRN: [id number] Date of Birth: [birth year]

## 2016-02-15 NOTE — Addendum Note (Signed)
Addended by: Lamar Benes on: 02/15/2016 08:10 PM   Modules accepted: Orders

## 2016-02-24 ENCOUNTER — Other Ambulatory Visit: Payer: Self-pay | Admitting: Internal Medicine

## 2016-02-24 ENCOUNTER — Ambulatory Visit: Payer: Medicare Other | Admitting: Physical Therapy

## 2016-02-24 DIAGNOSIS — R2689 Other abnormalities of gait and mobility: Secondary | ICD-10-CM | POA: Diagnosis not present

## 2016-02-24 DIAGNOSIS — R2681 Unsteadiness on feet: Secondary | ICD-10-CM | POA: Diagnosis not present

## 2016-02-24 DIAGNOSIS — R262 Difficulty in walking, not elsewhere classified: Secondary | ICD-10-CM | POA: Diagnosis not present

## 2016-02-24 DIAGNOSIS — R29818 Other symptoms and signs involving the nervous system: Secondary | ICD-10-CM | POA: Diagnosis not present

## 2016-02-24 DIAGNOSIS — R252 Cramp and spasm: Secondary | ICD-10-CM

## 2016-02-26 ENCOUNTER — Encounter: Payer: Self-pay | Admitting: Family Medicine

## 2016-02-26 ENCOUNTER — Ambulatory Visit (INDEPENDENT_AMBULATORY_CARE_PROVIDER_SITE_OTHER): Payer: Medicare Other | Admitting: Family Medicine

## 2016-02-26 VITALS — BP 120/78 | HR 68 | Temp 97.7°F

## 2016-02-26 DIAGNOSIS — G239 Degenerative disease of basal ganglia, unspecified: Secondary | ICD-10-CM | POA: Diagnosis not present

## 2016-02-26 DIAGNOSIS — E039 Hypothyroidism, unspecified: Secondary | ICD-10-CM

## 2016-02-26 DIAGNOSIS — R6883 Chills (without fever): Secondary | ICD-10-CM

## 2016-02-26 DIAGNOSIS — G20 Parkinson's disease: Secondary | ICD-10-CM

## 2016-02-26 DIAGNOSIS — E538 Deficiency of other specified B group vitamins: Secondary | ICD-10-CM

## 2016-02-26 DIAGNOSIS — R5383 Other fatigue: Secondary | ICD-10-CM

## 2016-02-26 DIAGNOSIS — R531 Weakness: Secondary | ICD-10-CM

## 2016-02-26 DIAGNOSIS — G903 Multi-system degeneration of the autonomic nervous system: Secondary | ICD-10-CM

## 2016-02-26 DIAGNOSIS — R296 Repeated falls: Secondary | ICD-10-CM | POA: Diagnosis not present

## 2016-02-26 LAB — POC URINALSYSI DIPSTICK (AUTOMATED)
BILIRUBIN UA: NEGATIVE
Blood, UA: NEGATIVE
Glucose, UA: NEGATIVE
KETONES UA: NEGATIVE
LEUKOCYTES UA: NEGATIVE
NITRITE UA: NEGATIVE
PH UA: 7.5
Protein, UA: NEGATIVE
Spec Grav, UA: <=1.005
Urobilinogen, UA: NEGATIVE

## 2016-02-26 MED ORDER — CYANOCOBALAMIN 1000 MCG/ML IJ SOLN
1000.0000 ug | Freq: Once | INTRAMUSCULAR | Status: AC
  Administered 2016-02-26: 1000 ug via INTRAMUSCULAR

## 2016-02-26 NOTE — Assessment & Plan Note (Signed)
Normal TFTs last month.

## 2016-02-26 NOTE — Therapy (Signed)
[facility] [address] [place], [place], [address] Phone: [phone]   Fax:  [phone]  Physical Therapy Treatment  Patient Details  Name: [name] [name] MRN: [id number] Date of Birth: Sep 04, 1941 Referring Provider: Dr. Star Age  Encounter Date: 02/24/2016      PT End of Session - 02/26/16 2039    Visit Number 18   Number of Visits 24   Date for PT Re-Evaluation 03/06/16   Authorization Type U[employer] MCR   Authorization Time Period 02-06-16 - 04-06-16   PT Start Time 1450   PT Stop Time 1535   PT Time Calculation (min) 45 min   Equipment Utilized During Treatment Gait belt      Past Medical History  Diagnosis Date  . GERD (gastroesophageal reflux disease)     s/p nissen  . Familial tremor     followed by Dr. Erling Cruz  . Unspecified chronic bronchitis ([place])   . Neurogenic bladder     Tannenbaum/MacDiarmid  . History of pneumonia   . Hypothyroidism   . Depression   . Asthma   . CAD (coronary artery disease)     myoview 5/08:  EF 76%, no scar, no ischemia, +ECG changes with exercise;  cath 04/08/07:  pLAD 30%, mLAD 60-70% - med tx.  Marland Kitchen HLD (hyperlipidemia)   . 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 VAGINITIS, ATROPHIC 11/07/2007  . Osteoporosis 2016    DEXA T-4.4 spine deteriorated since 2012  . Chronic cystitis     recurrent UTIs, started bactrim ppx (McDi[place]id)  . Internal hemorrhoids   . Hydronephrosis, bilateral     Past Surgical History  Procedure Laterality Date  . Laparoscopic nissen fundoplication    . Cystectomy    . Tumor removal    . Tubal ligation    . Colonoscopy N/A 08/29/2013    int hemm; Inda Castle, MD  . Dexa  12/2014    T -4.4 spine  . Esophagogastroduodenoscopy N/A 01/09/2015    Procedure: ESOPHAGOGASTRODUODENOSCOPY (EGD);  Surgeon: Inda Castle, MD;  Location: [facility];  Service: Endoscopy;  Laterality: N/A;  help with transfers    There were no  vitals filed for this visit.      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 at [facility] clinic in Aug. 2013;Pt. had UTI on 04-11-15; went to ED - received catherization; states she has had increased weakness and some mild decline in mobility since this problem   Patient Stated Goals improve walking and mobility   Currently in Pain? No/denies                         [facility] Adult PT Treatment/Exercise - 02/26/16 0001    Transfers   Transfers Sit to Stand;Stand to Sit   Sit to Stand 3: Mod assist   Stand to Sit 3: Mod assist   Ambulation/Gait   Ambulation/Gait Yes   Ambulation/Gait Assistance 3: Mod assist   Ambulation Distance (Feet) 60 Feet  50' 2nd rep   Assistive device Rolling walker   Gait Pattern Wide base of support;Decreased hip/knee flexion - right;Decreased  hip/knee flexion - left;Decreased stride length;Step-through pattern;Abducted- right;Decreased trunk rotation   Ambulation Surface Level;Indoor   Gait Comments R foot freezes and supinates due to incr. tone   Lumbar Exercises: Stretches   Single Knee to Chest Stretch 2 reps;20 seconds   Double Knee to Chest Stretch 2 reps;20 seconds   Lower Trunk Rotation 2 reps;30 seconds  in seated position   Piriformis Stretch 1 rep;20 seconds  both R and L sides   Lumbar Exercises: Supine   Bridge 10 reps   Knee/Hip Exercises: Aerobic   Stationary Bike SciFit level 1.5 x 15"  performed from wheelchair - no charge as unsupervised      TherAct; pt transferred wheelchair to mat using squat pivot transfer with max assist; sit to supine with mod assist -pt had much difficulty Transferring LE's up onto mat in preparation for lying supine Rolling supine to R  and L sides with min assist and cues for technique  R sidelying to sitting with mod assist            PT Short Term Goals - 01/18/16 1133    PT SHORT TERM GOAL #1   Title Pt. will increase standing tolerance so she is able to report standing 5" at home with S for independence with ADL's. (01-04-16)   Baseline varies - depending on status which varies on daily basis - 01-16-16   Status On-going   PT SHORT TERM GOAL #2   Title Amb. with RW 150' without requiring a seated rest period to demo incr. endurance.   Baseline 140' with RW on 01-16-16   Status Not Met   PT SHORT TERM GOAL #3   Title Transfer sit to stand from wheelchair with mod assist   (01-04-16)   Baseline met 01-16-16 - inconsistent at times due to fluctuations in status and performance   Status Achieved   PT SHORT TERM GOAL #4   Title Improve TUG score by at least 5 secs to demo improved functional mobiltiy.  (01-04-16)   Status Deferred           PT Long Term Goals - 02/07/16 1132    PT LONG TERM GOAL #1   Title Pt. will ambulate 240' nonstop with RW to demo incr. endurance with CGA  (02-04-16)   Baseline 120' with RW with min to mod assist   Status Not Met   PT LONG TERM GOAL #2   Title Report ability to stand at least 10" at home for improved standing tolerance  (02-04-16)   Baseline 1"-2" reported - with assist needed due to balance deficits   Status Not Met   PT LONG TERM GOAL #3   Title Perform sit to stand transfer from mat to RW with +1 min  assist. (02-04-16)   Baseline mod assist needed    Status Not Met   PT LONG TERM GOAL #4   Title Perform at least 15" on Nustep to demo improved activity tolerance/endurance  (02-04-16)   Baseline met 02-04-16   Status Achieved   PT LONG TERM GOAL #5   Title Independent to direct assistance prn with stretches and exercises for HEP.  (02-04-16)   Baseline met 02-04-16   Status Achieved   Additional Long Term Goals   Additional Long Term Goals Yes   PT LONG TERM GOAL #8    Title Pt will verbalize understanding of use/option of swivel transfer disc to assist ease with transfers at home.  (03-08-16)   Time 4  Period Weeks   Status New   PT LONG TERM GOAL  #9   TITLE Pt will amb. 120' with RW with min assist and will demo technique to recover from freezing episode with RLE.  (03-08-16)   Baseline 120' with mod to min assist   Time 4   Period Weeks   Status New               Plan - 02/26/16 2042    Clinical Impression Statement Pt had several occurrences of R foot freezing during gait, requiring max assist to prevent fall; status fluctuates due to MSA progression   Rehab Potential Good   Clinical Impairments Affecting Rehab Potential severity of deficits and progressive neurological disease   PT Frequency 2x / week   PT Duration 4 weeks   PT Treatment/Interventions ADLs/Self Care Home Management;Therapeutic activities;Patient/family education;Therapeutic exercise;Gait training;Balance training;Stair training;Neuromuscular re-education;Functional mobility training   PT Next Visit Plan cont gait and balance training   PT Home Exercise Plan stretches and supine strengthening exs.   Consulted and Agree with Plan of Care Patient      Patient will benefit from skilled therapeutic intervention in order to improve the following deficits and impairments:  Abnormal gait, Decreased coordination, Impaired flexibility, Difficulty walking, Decreased balance, Increased muscle spasms, Impaired tone, Decreased mobility, Decreased strength, Decreased activity tolerance, Decreased range of motion  Visit Diagnosis: Difficulty in walking, not elsewhere classified  Cramp and spasm  Other abnormalities of gait and mobility     Problem List Patient Active Problem List   Diagnosis Date Noted  . General weakness 02/26/2016  . Chills 02/26/2016  . Other fatigue 02/04/2016  . Neurogenic bladder   . Chronic cystitis 04/18/2015  . Pedal edema 04/01/2015  . Dyspepsia  01/09/2015  . Constipation 12/14/2014  . Medicare annual wellness visit, subsequent 11/27/2014  . Advanced care planning/counseling discussion 11/27/2014  . Osteoporosis   . Vitamin B12 deficiency 06/07/2014  . Recurrent falls 02/06/2014  . Multiple system atrophy ([place])   . Dysphagia, unspecified(787.20) 07/28/2013  . Shoulder pain, left 07/20/2012  . 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 Routine health maintenance 08/26/2011  . Murmur 05/27/2011  . HYPERCHOLESTEROLEMIA  IIA 05/14/2010  . VENOUS INSUFFICIENCY, LEGS 06/21/2009  . CORONARY HEART DISEASE 06/09/2008  . Hypothyroidism 05/15/2008  . ASTHMA 05/15/2008  . Depression 11/07/2007  . 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 GERD 11/07/2007  . VAGINITIS, ATROPHIC 11/07/2007    Alda Lea, PT 02/26/2016, 8:46 PM  [facility] [address] [place], [place], [address] Phone: [phone]   Fax:  [phone]  Name: [name] [name] MRN: [id number] Date of Birth: 1940-11-20

## 2016-02-26 NOTE — Patient Instructions (Addendum)
B12 shot today. We will refer you to home health for evaluation. Expect a call in the next few days to set up.

## 2016-02-26 NOTE — Assessment & Plan Note (Addendum)
Refer to [facility] PT/OT, home safety evaluation

## 2016-02-26 NOTE — Progress Notes (Signed)
Pre visit review using our clinic review tool, if applicable. No additional management support is needed unless otherwise documented below in the visit note. 

## 2016-02-26 NOTE — Assessment & Plan Note (Signed)
Progressive - concern for MSA progression, discussed. Refer to [facility].

## 2016-02-26 NOTE — Assessment & Plan Note (Signed)
Update B12 shot today - normally receives through neuro

## 2016-02-26 NOTE — Progress Notes (Signed)
BP 120/78 mmHg  Pulse 68  Temp(Src) 97.7 F (36.5 C) (Oral)  SpO2 97%   CC: ongoing weakness, chills  Subjective:    Patient ID: [name] [name], female    DOB: 16-Feb-1941, 75 y.o.   MRN: [id number]  HPI: [name] [name] is a 75 y.o. female presenting on 02/26/2016 for Fever and Chills   Presents with husband today.  Worsening chills over last few weeks that can waker her up at night time. Temperature nadir 96.7. Tmax 98.7. Progressive weakness over last 2 weeks. Worsening R foot stiffness. Uses pull ups, goes through 4-5/day. Notes worsening tremors as well. Noticing some nasal pressure that started this morning. No night sweats  Denies coughing, chest pain, dyspnea/wheezing, dysuria, hematuria or blood in stool. No headaches, ST, PNdrainage.   See prior note for details. last visit UA unrevealing, labwork unrevealing as well. ?progression of MSA. Again UA normal today.   B12 low normal - B12 shot today.   Denies decub ulcers. Regularly uses lotion to skin. Ongoing urinary incontinence, wears depens daily. Has had episodes of weakness leading to near falls. Husband who is her primary caregiver at times is struggling to care for her at home. Requests assistance at home.  Relevant past medical, surgical, family and social history reviewed and updated as indicated. Interim medical history since our last visit reviewed. Allergies and medications reviewed and updated. Current Outpatient Prescriptions on File Prior to Visit  Medication Sig  . albuterol (PROAIR HFA) 108 (90 BASE) MCG/ACT inhaler Inhale 2 puffs into the lungs every 6 (six) hours as needed for wheezing or shortness of breath. Reported on 01/22/2016  . alum & mag hydroxide-simeth (MYLANTA) 017-793-90 MG/5ML suspension Take 15 mLs by mouth every 6 (six) hours as needed for indigestion or heartburn.  Marland Kitchen aspirin (ASPIRIN EC) 81 MG EC tablet Take 81 mg by mouth 2 (two) times a week. Swallow whole. Take at bedtime  . baclofen  (LIORESAL) 10 MG tablet Take 0.5 tablets (5 mg total) by mouth 3 (three) times daily.  . beclomethasone (QVAR) 80 MCG/ACT inhaler Inhale 1 puff into the lungs daily as needed (wheezing). Reported on 01/22/2016  . Calcium Carbonate-Vitamin D (CALCIUM-VITAMIN D) 500-200 MG-UNIT per tablet Take 1 tablet by mouth daily. Reported on 01/22/2016  . clonazePAM (KLONOPIN) 0.5 MG tablet TAKE 1 TO 2 TABLETS BY MOUTH EVERY DAY AS NEEDED  . Cranberry (ELLURA) 200 MG CAPS Take 1 capsule by mouth daily.  . CRESTOR 20 MG tablet TAKE ONE-HALF TABLET BY MOUTH ONCE EVERY EVENING  . cyanocobalamin (,VITAMIN B-12,) 1000 MCG/ML injection Inject 1,000 mcg into the muscle every 30 (thirty) days.    Marland Kitchen dexlansoprazole (DEXILANT) 60 MG capsule Take 60 mg by mouth as needed.  . famotidine (PEPCID) 40 MG tablet TAKE 1 TABLET (40 MG TOTAL) BY MOUTH 2 (TWO) TIMES DAILY AS NEEDED FOR HEARTBURN.  . fluocinolone (VANOS) 0.01 % cream Apply 1 application topically 2 (two) times daily as needed (itching).   . furosemide (LASIX) 20 MG tablet Take 1 tablet (20 mg total) by mouth daily as needed for fluid or edema.  . hydrOXYzine (ATARAX/VISTARIL) 10 MG tablet Take 10 mg by mouth 3 (three) times daily as needed for itching. Reported on 01/22/2016  . hyoscyamine (LEVSIN SL) 0.125 MG SL tablet PLACE 2 TABLETS (0.25 MG TOTAL) UNDER THE TONGUE EVERY 4 (FOUR) HOURS AS NEEDED (SPASMS).  . hyoscyamine (LEVSIN SL) 0.125 MG SL tablet PLACE 2 TABLETS (0.25 MG TOTAL) UNDER THE TONGUE  EVERY 4 (FOUR) HOURS AS NEEDED (SPASMS).  . ibandronate (BONIVA) 3 MG/3ML SOLN injection Inject 3 mg into the vein once. Reported on 01/22/2016  . isosorbide mononitrate (IMDUR) 30 MG 24 hr tablet TAKE 1/2 TABLET BY MOUTH DAILY  . loratadine (CLARITIN) 10 MG tablet Take 10 mg by mouth 2 (two) times daily as needed (allergies). Reported on 01/22/2016  . Mirabegron (MYRBETRIQ PO) Take 1 tablet by mouth daily.  . mometasone (NASONEX) 50 MCG/ACT nasal spray Place 2 sprays into  the nose daily as needed (allergies). Reported on 01/22/2016  . nitroGLYCERIN (NITROSTAT) 0.4 MG SL tablet Place 1 tablet (0.4 mg total) under the tongue every 5 (five) minutes as needed (chest pain).  . polyethylene glycol powder (GLYCOLAX/MIRALAX) powder Take 17 g by mouth daily as needed for moderate constipation.  . propranolol (INDERAL) 10 MG tablet TAKE 1 TABLET BY MOUTH 3 TIMES DAILY AS NEEDED (TREMORS).  Marland Kitchen sertraline (ZOLOFT) 25 MG tablet Take 1 tablet (25 mg total) by mouth daily.  Marland Kitchen SYNTHROID 112 MCG tablet TAKE 1 TABLET (112 MCG TOTAL) BY MOUTH DAILY BEFORE BREAKFAST.  Marland Kitchen triamcinolone cream (KENALOG) 0.1 % Apply 1 application topically daily. Reported on 01/22/2016   No current facility-administered medications on file prior to visit.    Review of Systems Per HPI unless specifically indicated in ROS section     Objective:    BP 120/78 mmHg  Pulse 68  Temp(Src) 97.7 F (36.5 C) (Oral)  SpO2 97%  Wt Readings from Last 3 Encounters:  02/04/16 152 lb 8 oz (69.174 kg)  04/03/15 145 lb (65.772 kg)  01/11/15 150 lb (68.04 kg)    Physical Exam  Constitutional: She appears well-developed and well-nourished. No distress.  In wheelchair at baseline  HENT:  Mouth/Throat: Oropharynx is clear and moist. No oropharyngeal exudate.  Eyes: Conjunctivae and EOM are normal. Pupils are equal, round, and reactive to light.  Neck: Normal range of motion. Neck supple.  Cardiovascular: Normal rate, regular rhythm, normal heart sounds and intact distal pulses.   No murmur heard. Pulmonary/Chest: Effort normal and breath sounds normal. No respiratory distress. She has no wheezes. She has no rales.  Abdominal: Soft. Bowel sounds are normal. She exhibits no mass. There is no tenderness. There is no rebound and no guarding.  Musculoskeletal: She exhibits no edema.  Neurological:  Mild spasticity of extremities noted Difficulty controlling extremities esp R leg Unable to stand from seated  position  Skin: Skin is warm and dry. No rash noted.  Psychiatric: She has a normal mood and affect.  Nursing note and vitals reviewed.  Results for orders placed or performed in visit on 02/26/16  POCT Urinalysis Dipstick (Automated)  Result Value Ref Range   Color, UA yellow    Clarity, UA clear    Glucose, UA neg    Bilirubin, UA neg    Ketones, UA neg    Spec Grav, UA <=1.005    Blood, UA neg    pH, UA 7.5    Protein, UA neg    Urobilinogen, UA negative    Nitrite, UA neg    Leukocytes, UA Negative Negative      Assessment & Plan:   Problem List Items Addressed This Visit    Hypothyroidism    Normal TFTs last month.       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 No infectious cause for chills  found, reviewed last month's stable labwork and chest xray. ?chills due to temperature dysregulation as manifestation of dysautonomia that comes with MSA. Encouraged keep f/u with neuro - next appt 04/2016. Discussed progressive weakness and husband's incremental difficulty continuing to care for patient at home. Pt/husband request further assistance for home care. Will place [facility] referral today for OT, PT, RN, nurse aide and social worker eval as well as to complete home safety evaluation.  Patient meets homebound status due to [place] dx and progressive weakness, wheelchair bound status. Will provider husband with support group resources at net visit: [email or web address]      Relevant Orders   Ambulatory referral to [place]   Recurrent falls    Refer to [facility] PT/OT, home safety evaluation      Relevant Orders   Ambulatory referral to [place] deficiency    Update B12 shot today - normally receives through neuro      Relevant Medications   cyanocobalamin ((VITAMIN B-12)) injection 1,000 mcg (Completed)   Other fatigue   Relevant Orders   Ambulatory  referral to [place] weakness    Progressive - concern for MSA progression, discussed. Refer to [facility].       Relevant Orders   Ambulatory referral to [place]    ?manifestation of temperature dysregulation from autonomic failure due to MSA      Relevant Orders   POCT Urinalysis Dipstick (Automated) (Completed)       Follow up plan: Return in about 2 months (around 04/27/2016), or as needed, for follow up visit.  Ria Bush, MD

## 2016-02-26 NOTE — Assessment & Plan Note (Signed)
?  manifestation of temperature dysregulation from autonomic failure due to [place]

## 2016-02-26 NOTE — Assessment & Plan Note (Addendum)
Concern for progression of MSA - discussed with patient. No infectious cause for chills found, reviewed last month's stable labwork and chest xray. ?chills due to temperature dysregulation as manifestation of dysautonomia that comes with MSA. Encouraged keep f/u with neuro - next appt 04/2016. Discussed progressive weakness and husband's incremental difficulty continuing to care for patient at home. Pt/husband request further assistance for home care. Will place [facility] referral today for OT, PT, RN, nurse aide and social worker eval as well as to complete home safety evaluation.  Patient meets homebound status due to [place] dx and progressive weakness, wheelchair bound status. Will provider husband with support group resources at net visit: [email or web address]

## 2016-03-02 ENCOUNTER — Telehealth: Payer: Self-pay | Admitting: Internal Medicine

## 2016-03-02 NOTE — Telephone Encounter (Signed)
Spoke with pt's husband, c/o chest congestion, nonprod cough, chills, fatigue X3 weeks.  Pt has not been seen since 2010.  Scheduled for consult appt on Wednesday with MW.  Nothing further needed.

## 2016-03-03 ENCOUNTER — Telehealth: Payer: Self-pay | Admitting: Internal Medicine

## 2016-03-03 ENCOUNTER — Ambulatory Visit: Payer: Medicare Other | Admitting: Physical Therapy

## 2016-03-03 DIAGNOSIS — R2689 Other abnormalities of gait and mobility: Secondary | ICD-10-CM | POA: Diagnosis not present

## 2016-03-03 DIAGNOSIS — R29818 Other symptoms and signs involving the nervous system: Secondary | ICD-10-CM | POA: Diagnosis not present

## 2016-03-03 DIAGNOSIS — R262 Difficulty in walking, not elsewhere classified: Secondary | ICD-10-CM | POA: Diagnosis not present

## 2016-03-03 DIAGNOSIS — R2681 Unsteadiness on feet: Secondary | ICD-10-CM | POA: Diagnosis not present

## 2016-03-03 DIAGNOSIS — R252 Cramp and spasm: Secondary | ICD-10-CM | POA: Diagnosis not present

## 2016-03-03 NOTE — Telephone Encounter (Signed)
°  pt husband calling wanting wife to see dr young, she is a former pt of [facility] hasn't been seen since 2010, had an appointment w/MW for tomorrow but cx, said couldn't get her ready by that time explained to him that [facility] wasn't taking on any new pt even though she was a former pt of his, that it has been more than 3 years since she last saw him, that she would be considered as a new pt, he said that's who she has always seen and that's who she needs to see, please advise.Hillery Hunter

## 2016-03-03 NOTE — Telephone Encounter (Signed)
Spoke with pt's husband. Advised him that [facility] is not taking on "new patients" since he will be retiring soon. Pt's husband wants to reschedule with MW for an afternoon appointment. Pt has been scheduled for 03/20/16 at 2:15pm. Nothing further was needed.

## 2016-03-04 ENCOUNTER — Institutional Professional Consult (permissible substitution): Payer: Self-pay | Admitting: Internal Medicine

## 2016-03-04 DIAGNOSIS — L03019 Cellulitis of unspecified finger: Secondary | ICD-10-CM | POA: Diagnosis not present

## 2016-03-05 ENCOUNTER — Ambulatory Visit: Payer: Medicare Other | Admitting: Physical Therapy

## 2016-03-05 DIAGNOSIS — L609 Nail disorder, unspecified: Secondary | ICD-10-CM | POA: Diagnosis not present

## 2016-03-05 NOTE — Therapy (Signed)
[facility] [address] [place], [place], [address] Phone: [phone]   Fax:  [phone]  Physical Therapy Treatment  Patient Details  Name: [name] [name] MRN: [id number] Date of Birth: Jul 17, 1941 Referring Provider: Dr. Star Age  Encounter Date: 03/03/2016      PT End of Session - 03/05/16 1154    Visit Number 19   Number of Visits 24   Date for PT Re-Evaluation 03/06/16   Authorization Type U[employer] MCR   Authorization Time Period 02-06-16 - 04-06-16   PT Start Time 1500   PT Stop Time 1546   PT Time Calculation (min) 46 min   Equipment Utilized During Treatment Gait belt      Past Medical History  Diagnosis Date  . GERD (gastroesophageal reflux disease)     s/p nissen  . Familial tremor     followed by Dr. Erling Cruz  . Unspecified chronic bronchitis ([place])   . Neurogenic bladder     Tannenbaum/MacDiarmid  . History of pneumonia   . Hypothyroidism   . Depression   . Asthma   . CAD (coronary artery disease)     myoview 5/08:  EF 76%, no scar, no ischemia, +ECG changes with exercise;  cath 04/08/07:  pLAD 30%, mLAD 60-70% - med tx.  Marland Kitchen HLD (hyperlipidemia)   . 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 VAGINITIS, ATROPHIC 11/07/2007  . Osteoporosis 2016    DEXA T-4.4 spine deteriorated since 2012  . Chronic cystitis     recurrent UTIs, started bactrim ppx (McDiarmid)  . Internal hemorrhoids   . Hydronephrosis, bilateral     Past Surgical History  Procedure Laterality Date  . Laparoscopic nissen fundoplication    . Cystectomy    . Tumor removal    . Tubal ligation    . Colonoscopy N/A 08/29/2013    int hemm; Inda Castle, MD  . Dexa  12/2014    T -4.4 spine  . Esophagogastroduodenoscopy N/A 01/09/2015    Procedure: ESOPHAGOGASTRODUODENOSCOPY (EGD);  Surgeon: Inda Castle, MD;  Location: [facility];  Service: Endoscopy;  Laterality: N/A;  help with transfers    There were no  vitals filed for this visit.      Subjective Assessment - 03/05/16 1148    Subjective Pt states she went back to Dr. 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 at [facility] clinic in Aug. 2013;Pt. had UTI on 04-11-15; went to ED - received catherization; states she has had increased weakness and some mild decline in mobility since this problem   Patient Stated Goals improve walking and mobility   Currently in Pain? No/denies                         [facility] Adult PT Treatment/Exercise - 03/05/16 0001    Ambulation/Gait   Ambulation/Gait Yes   Ambulation/Gait Assistance 3: Mod assist   Ambulation/Gait Assistance Details cues for weight shift when R foot freezes   Ambulation Distance (Feet) 70 Feet  approx. 34' with RW 2nd rep   Assistive device Rolling walker   Gait Pattern Wide base of support;Decreased hip/knee flexion - right;Decreased hip/knee flexion - left;Decreased stride length;Step-through pattern;Abducted- right;Decreased trunk rotation  Ambulation Surface Level;Indoor   Gait Comments R foot freezes and supinates due to incr. tone   Lumbar Exercises: Stretches   Lower Trunk Rotation 2 reps;30 seconds  in seated position   Lumbar Exercises: Supine   Bridge 10 reps  2 sets   Knee/Hip Exercises: Aerobic   Stationary Bike SciFit level 1.5 x 15" with UE's and LE's  performed from wheelchair - no charge as unsupervised   Knee/Hip Exercises: Supine   Other Supine Knee/Hip Exercises knee to chest with min. resistance; alternate hip flexion in hooklying position x 10 reps each                  PT Short Term Goals - 01/18/16 1133    PT SHORT TERM GOAL #1   Title Pt. will increase standing tolerance so she is able to report standing 5" at home with S  for independence with ADL's. (01-04-16)   Baseline varies - depending on status which varies on daily basis - 01-16-16   Status On-going   PT SHORT TERM GOAL #2   Title Amb. with RW 150' without requiring a seated rest period to demo incr. endurance.   Baseline 140' with RW on 01-16-16   Status Not Met   PT SHORT TERM GOAL #3   Title Transfer sit to stand from wheelchair with mod assist   (01-04-16)   Baseline met 01-16-16 - inconsistent at times due to fluctuations in status and performance   Status Achieved   PT SHORT TERM GOAL #4   Title Improve TUG score by at least 5 secs to demo improved functional mobiltiy.  (01-04-16)   Status Deferred           PT Long Term Goals - 02/07/16 1132    PT LONG TERM GOAL #1   Title Pt. will ambulate 240' nonstop with RW to demo incr. endurance with CGA  (02-04-16)   Baseline 120' with RW with min to mod assist   Status Not Met   PT LONG TERM GOAL #2   Title Report ability to stand at least 10" at home for improved standing tolerance  (02-04-16)   Baseline 1"-2" reported - with assist needed due to balance deficits   Status Not Met   PT LONG TERM GOAL #3   Title Perform sit to stand transfer from mat to RW with +1 min  assist. (02-04-16)   Baseline mod assist needed    Status Not Met   PT LONG TERM GOAL #4   Title Perform at least 15" on Nustep to demo improved activity tolerance/endurance  (02-04-16)   Baseline met 02-04-16   Status Achieved   PT LONG TERM GOAL #5   Title Independent to direct assistance prn with stretches and exercises for HEP.  (02-04-16)   Baseline met 02-04-16   Status Achieved   Additional Long Term Goals   Additional Long Term Goals Yes   PT LONG TERM GOAL #8   Title Pt will verbalize understanding of use/option of swivel transfer disc to assist ease with transfers at home.  (03-08-16)   Time 4   Period Weeks   Status New   PT LONG TERM GOAL  #9   TITLE Pt will amb. 120' with RW with min assist and will demo technique to  recover from freezing episode with RLE.  (03-08-16)   Baseline 120' with mod to min assist   Time 4   Period Weeks   Status New  Plan - 03/05/16 1156    Clinical Impression Statement Pt had more difficulty with mobility today - status continues to fluctuate; pt needed mod assist to transfer RLE up onto mat with sit to supine transfer; R foot continues to supinate and freeze during gait   Rehab Potential Good   Clinical Impairments Affecting Rehab Potential severity of deficits and progressive neurological disease   PT Frequency 2x / week   PT Duration 4 weeks   PT Treatment/Interventions ADLs/Self Care Home Management;Therapeutic activities;Patient/family education;Therapeutic exercise;Gait training;Balance training;Stair training;Neuromuscular re-education;Functional mobility training   PT Next Visit Plan cont gait and balance training   PT Home Exercise Plan stretches and supine strengthening exs.   Consulted and Agree with Plan of Care Patient      Patient will benefit from skilled therapeutic intervention in order to improve the following deficits and impairments:  Abnormal gait, Decreased coordination, Impaired flexibility, Difficulty walking, Decreased balance, Increased muscle spasms, Impaired tone, Decreased mobility, Decreased strength, Decreased activity tolerance, Decreased range of motion  Visit Diagnosis: Other abnormalities of gait and mobility  Other symptoms and signs involving the nervous system     Problem List Patient Active Problem List   Diagnosis Date Noted  . General weakness 02/26/2016  . Chills 02/26/2016  . Other fatigue 02/04/2016  . Neurogenic bladder   . Chronic cystitis 04/18/2015  . Pedal edema 04/01/2015  . Dyspepsia 01/09/2015  . Constipation 12/14/2014  . Medicare annual wellness visit, subsequent 11/27/2014  . Advanced care planning/counseling discussion 11/27/2014  . Osteoporosis   . Vitamin B12 deficiency 06/07/2014   . Recurrent falls 02/06/2014  . Multiple system atrophy ([place])   . Dysphagia, unspecified(787.20) 07/28/2013  . Shoulder pain, left 07/20/2012  . 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 Routine health maintenance 08/26/2011  . Murmur 05/27/2011  . HYPERCHOLESTEROLEMIA  IIA 05/14/2010  . VENOUS INSUFFICIENCY, LEGS 06/21/2009  . CORONARY HEART DISEASE 06/09/2008  . Hypothyroidism 05/15/2008  . ASTHMA 05/15/2008  . Depression 11/07/2007  . 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 GERD 11/07/2007  . VAGINITIS, ATROPHIC 11/07/2007    Alda Lea, PT 03/05/2016, 12:01 PM  [facility] [address] [place], [place], [address] Phone: [phone]   Fax:  [phone]  Name: [name] [name] MRN: [id number] Date of Birth: [birth year]

## 2016-03-17 ENCOUNTER — Telehealth: Payer: Self-pay | Admitting: Family Medicine

## 2016-03-17 ENCOUNTER — Encounter (HOSPITAL_COMMUNITY): Payer: Self-pay | Admitting: Emergency Medicine

## 2016-03-17 ENCOUNTER — Emergency Department (HOSPITAL_COMMUNITY)
Admission: EM | Admit: 2016-03-17 | Discharge: 2016-03-17 | Disposition: A | Discharge status: Home / Self Care | Payer: Medicare Other | Attending: Emergency Medicine | Admitting: Emergency Medicine

## 2016-03-17 DIAGNOSIS — E785 Hyperlipidemia, unspecified: Secondary | ICD-10-CM | POA: Insufficient documentation

## 2016-03-17 DIAGNOSIS — E039 Hypothyroidism, unspecified: Secondary | ICD-10-CM | POA: Insufficient documentation

## 2016-03-17 DIAGNOSIS — Z792 Long term (current) use of antibiotics: Secondary | ICD-10-CM | POA: Diagnosis not present

## 2016-03-17 DIAGNOSIS — R6883 Chills (without fever): Secondary | ICD-10-CM | POA: Diagnosis present

## 2016-03-17 DIAGNOSIS — F329 Major depressive disorder, single episode, unspecified: Secondary | ICD-10-CM | POA: Diagnosis not present

## 2016-03-17 DIAGNOSIS — K219 Gastro-esophageal reflux disease without esophagitis: Secondary | ICD-10-CM | POA: Diagnosis not present

## 2016-03-17 DIAGNOSIS — Z7982 Long term (current) use of aspirin: Secondary | ICD-10-CM | POA: Diagnosis not present

## 2016-03-17 DIAGNOSIS — R251 Tremor, unspecified: Secondary | ICD-10-CM | POA: Insufficient documentation

## 2016-03-17 DIAGNOSIS — J45909 Unspecified asthma, uncomplicated: Secondary | ICD-10-CM | POA: Insufficient documentation

## 2016-03-17 DIAGNOSIS — Z87448 Personal history of other diseases of urinary system: Secondary | ICD-10-CM | POA: Insufficient documentation

## 2016-03-17 DIAGNOSIS — Z8669 Personal history of other diseases of the nervous system and sense organs: Secondary | ICD-10-CM | POA: Diagnosis not present

## 2016-03-17 DIAGNOSIS — Z8742 Personal history of other diseases of the female genital tract: Secondary | ICD-10-CM | POA: Insufficient documentation

## 2016-03-17 DIAGNOSIS — Z79899 Other long term (current) drug therapy: Secondary | ICD-10-CM | POA: Insufficient documentation

## 2016-03-17 DIAGNOSIS — I2510 Atherosclerotic heart disease of native coronary artery without angina pectoris: Secondary | ICD-10-CM | POA: Insufficient documentation

## 2016-03-17 LAB — CBC
HCT: 40 % (ref 36.0–46.0)
Hemoglobin: 13.3 g/dL (ref 12.0–15.0)
MCH: 29.2 pg (ref 26.0–34.0)
MCHC: 33.3 g/dL (ref 30.0–36.0)
MCV: 87.7 fL (ref 78.0–100.0)
PLATELETS: 249 10*3/uL (ref 150–400)
RBC: 4.56 MIL/uL (ref 3.87–5.11)
RDW: 13 % (ref 11.5–15.5)
WBC: 5.5 10*3/uL (ref 4.0–10.5)

## 2016-03-17 LAB — BASIC METABOLIC PANEL
ANION GAP: 10 (ref 5–15)
BUN: 15 mg/dL (ref 6–20)
CHLORIDE: 110 mmol/L (ref 101–111)
CO2: 20 mmol/L — ABNORMAL LOW (ref 22–32)
CREATININE: 0.72 mg/dL (ref 0.44–1.00)
Calcium: 9.6 mg/dL (ref 8.9–10.3)
GFR calc non Af Amer: >60 mL/min (ref >60)
Glucose, Bld: 104 mg/dL — ABNORMAL HIGH (ref 65–99)
POTASSIUM: 4.2 mmol/L (ref 3.5–5.1)
SODIUM: 140 mmol/L (ref 135–145)

## 2016-03-17 LAB — URINALYSIS, ROUTINE W REFLEX MICROSCOPIC
Bilirubin Urine: NEGATIVE
GLUCOSE, UA: NEGATIVE mg/dL
Hgb urine dipstick: NEGATIVE
Ketones, ur: NEGATIVE mg/dL
LEUKOCYTES UA: NEGATIVE
Nitrite: NEGATIVE
PROTEIN: NEGATIVE mg/dL
Specific Gravity, Urine: 1.008 (ref 1.005–1.030)
pH: 6.5 (ref 5.0–8.0)

## 2016-03-17 LAB — I-STAT CG4 LACTIC ACID, ED: LACTIC ACID, VENOUS: 0.86 mmol/L (ref 0.5–2.0)

## 2016-03-17 NOTE — ED Notes (Signed)
Pt. Put on bedpan. Pt. Placed on new peri pad after bed pan. Pericare performed.

## 2016-03-17 NOTE — Discharge Instructions (Signed)
°  There is no evidence for bacterial infection that would require treatment at this time. Continue usual medications. Watch for high fever, and follow-up with your PCP if that develops. Otherwise, follow-up with your neurologist for evaluation of the symptoms, and to see if the [place] is getting worse.     Tremor A tremor is trembling or shaking that you cannot control. Most tremors affect the hands or arms. Tremors can also affect the head, vocal cords, face, and other parts of the body. There are many types of tremors. Common types include:   Essential tremor. These usually occur in people over the age of 28. It may run in families and can happen in otherwise healthy people.   Resting tremor. These occur when the muscles are at rest, such as when your hands are resting in your lap. People with Parkinson disease often have resting tremors.   Postural tremor. These occur when you try to hold a pose, such as keeping your hands outstretched.   Kinetic tremor. These occur during purposeful movement, such as trying to touch a finger to your nose.   Task-specific tremor. These may occur when you perform tasks such as handwriting, speaking, or standing.   Psychogenic tremor. These dramatically lessen or disappear when you are distracted. They can happen in people of all ages.  Some types of tremors have no known cause. Tremors can also be a symptom of nervous system problems (neurological disorders) that may occur with aging. Some tremors go away with treatment while others do not.  HOME CARE INSTRUCTIONS Watch your tremor for any changes. The following actions may help to lessen any discomfort you are feeling:  Take medicines only as directed by your health care provider.   Limit alcohol intake to no more than 1 drink per day for nonpregnant women and 2 drinks per day for men. One drink equals 12 oz of beer, 5 oz of wine, or 1 oz of hard liquor.  Do not use any tobacco products,  including cigarettes, chewing tobacco, or electronic cigarettes. If you need help quitting, ask your health care provider.   Avoid extreme heat or cold.   Limit the amount of caffeine you consumeas directed by your health care provider.   Try to get 8 hours of sleep each night.  Find ways to manage your stress, such as meditation or yoga.  Keep all follow-up visits as directed by your health care provider. This is important. SEEK MEDICAL CARE IF:  You start having a tremor after starting a new medicine.  You have tremor with other symptoms such as:  Numbness.  Tingling.  Pain.  Weakness.  Your tremor gets worse.  Your tremor interferes with your day-to-day life.   This information is not intended to replace advice given to you by your health care provider. Make sure you discuss any questions you have with your health care provider.   Document Released: 10/16/2002 Document Revised: 11/16/2014 Document Reviewed: 04/23/2014 Elsevier Interactive Patient Education [employer].

## 2016-03-17 NOTE — ED Provider Notes (Signed)
CSN: [id number]     Arrival date & time 03/17/16  1522 History   First MD Initiated Contact with Patient 03/17/16 1641     Chief Complaint  Patient presents with  . Chills     (Consider location/radiation/quality/duration/timing/severity/associated sxs/prior Treatment) HPI   [name] [name] is a 75 y.o. female here for evaluation of "chills". She describes the chills as severe shaking, which occurs intermittently. Chills. Been present for years but have become worse in the last 4 days. She saw her PCP twice, the last 2 months for the same problem. Today, she called him about the chills and he suggested she come to the emergency department. She is being treated with fluconazole for a left thumb fungal infection, status post removal of the nail a couple of weeks ago. She denies fever, cough, chest pain, shortness of breath, abdominal pain, back pain, weakness, dizziness, dysuria, urinary frequency, or hematuria. She has been constipated for the last several days and had a hard bowel movement this morning. No known sick contacts. She is concerned that the chills are from her rare disease called multi-system atrophy. She follows with a neurologist for this issue. Because of the [place], she has to use a walker or wheelchair to get around. She is able to take a few steps with assistance, usually by her husband. There are no other no modifying factors.   Past Medical History  Diagnosis Date  . GERD (gastroesophageal reflux disease)     s/p nissen  . Familial tremor     followed by Dr. Erling Cruz  . Unspecified chronic bronchitis ([place])   . Neurogenic bladder     Tannenbaum/MacDiarmid  . History of pneumonia   . Hypothyroidism   . Depression   . Asthma   . CAD (coronary artery disease)     myoview 5/08:  EF 76%, no scar, no ischemia, +ECG changes with exercise;  cath 04/08/07:  pLAD 30%, mLAD 60-70% - med tx.  Marland Kitchen HLD (hyperlipidemia)   . 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  VAGINITIS, ATROPHIC 11/07/2007  . Osteoporosis 2016    DEXA T-4.4 spine deteriorated since 2012  . Chronic cystitis     recurrent UTIs, started bactrim ppx (McDiarmid)  . Internal hemorrhoids   . Hydronephrosis, bilateral    Past Surgical History  Procedure Laterality Date  . Laparoscopic nissen fundoplication    . Cystectomy    . Tumor removal    . Tubal ligation    . Colonoscopy N/A 08/29/2013    int hemm; Inda Castle, MD  . Dexa  12/2014    T -4.4 spine  . Esophagogastroduodenoscopy N/A 01/09/2015    Procedure: ESOPHAGOGASTRODUODENOSCOPY (EGD);  Surgeon: Inda Castle, MD;  Location: [facility];  Service: Endoscopy;  Laterality: N/A;  help with transfers   Family History  Problem Relation Age of Onset  . Coronary artery disease Father   . Heart attack Father   . Diabetes Daughter   . Breast cancer Maternal Aunt   . Hypertension Mother   . Heart disease Father   . Coronary artery disease Daughter   . Colon cancer Maternal Aunt   . Multiple sclerosis Daughter   . Stroke Daughter   . Heart attack Daughter    Social History  Substance Use Topics  . Smoking status: Never Smoker   . Smokeless tobacco: Never Used  . Alcohol Use: No   OB History    No data available  Review of Systems  All other systems reviewed and are negative.     Allergies  Keflex  Home Medications   Prior to Admission medications   Medication Sig Start Date End Date Taking? Authorizing Provider  aspirin (ASPIRIN EC) 81 MG EC tablet Take 81 mg by mouth 2 (two) times a week. Swallow whole. Take at bedtime   Yes Historical Provider, MD  cephALEXin (KEFLEX) 250 MG capsule Take 250 mg by mouth every morning.   Yes Historical Provider, MD  clonazePAM (KLONOPIN) 0.5 MG tablet TAKE 1 TO 2 TABLETS BY MOUTH EVERY DAY AS NEEDED 08/19/15  Yes Ria Bush, MD  Cranberry (ELLURA) 200 MG CAPS Take 1 capsule by mouth every evening.    Yes Historical Provider, MD  cyanocobalamin (,VITAMIN  B-12,) 1000 MCG/ML injection Inject 1,000 mcg into the muscle every 30 (thirty) days. Take the first of the month   Yes Historical Provider, MD  dexlansoprazole (DEXILANT) 60 MG capsule Take 60 mg by mouth daily as needed. For acid reflux   Yes Historical Provider, MD  famotidine (PEPCID) 40 MG tablet Take 40 mg by mouth 2 (two) times daily.   Yes Historical Provider, MD  fluconazole (DIFLUCAN) 150 MG tablet Take 150 mg by mouth every other day. Taking every day for her fungus in her finger nail   Yes Historical Provider, MD  furosemide (LASIX) 20 MG tablet Take 1 tablet (20 mg total) by mouth daily as needed for fluid or edema. 04/04/15  Yes Ria Bush, MD  isosorbide mononitrate (IMDUR) 30 MG 24 hr tablet Take 15 mg by mouth at bedtime.   Yes Historical Provider, MD  nitroGLYCERIN (NITROSTAT) 0.4 MG SL tablet Place 1 tablet (0.4 mg total) under the tongue every 5 (five) minutes as needed (chest pain). 08/07/15  Yes [name], MD  propranolol (INDERAL) 10 MG tablet TAKE 1 TABLET BY MOUTH 3 TIMES DAILY AS NEEDED (TREMORS). 08/12/15  Yes Ria Bush, MD  rosuvastatin (CRESTOR) 20 MG tablet Take 20 mg by mouth every evening.   Yes Historical Provider, MD  sertraline (ZOLOFT) 25 MG tablet Take 1 tablet (25 mg total) by mouth daily. 10/05/15  Yes Ria Bush, MD  SYNTHROID 112 MCG tablet TAKE 1 TABLET (112 MCG TOTAL) BY MOUTH DAILY BEFORE BREAKFAST. 12/02/15  Yes Ria Bush, MD  albuterol (PROAIR HFA) 108 (90 BASE) MCG/ACT inhaler Inhale 2 puffs into the lungs every 6 (six) hours as needed for wheezing or shortness of breath. Reported on 01/22/2016    Historical Provider, MD  baclofen (LIORESAL) 10 MG tablet Take 0.5 tablets (5 mg total) by mouth 3 (three) times daily. Patient not taking: Reported on 03/17/2016 01/22/16   Dennie Bible, NP  CRESTOR 20 MG tablet TAKE ONE-HALF TABLET BY MOUTH ONCE EVERY EVENING Patient not taking: Reported on 03/17/2016 01/15/16   [name], MD   famotidine (PEPCID) 40 MG tablet TAKE 1 TABLET (40 MG TOTAL) BY MOUTH 2 (TWO) TIMES DAILY AS NEEDED FOR HEARTBURN. Patient not taking: Reported on 03/17/2016 12/23/15   Jerene Bears, MD  hyoscyamine (LEVSIN SL) 0.125 MG SL tablet PLACE 2 TABLETS (0.25 MG TOTAL) UNDER THE TONGUE EVERY 4 (FOUR) HOURS AS NEEDED (SPASMS). Patient not taking: Reported on 03/17/2016 12/23/15   Jerene Bears, MD  hyoscyamine (LEVSIN SL) 0.125 MG SL tablet PLACE 2 TABLETS (0.25 MG TOTAL) UNDER THE TONGUE EVERY 4 (FOUR) HOURS AS NEEDED (SPASMS). Patient not taking: Reported on 03/17/2016 02/24/16   Jerene Bears, MD  isosorbide mononitrate (IMDUR) 30 MG 24 hr tablet TAKE 1/2 TABLET BY MOUTH DAILY Patient not taking: Reported on 03/17/2016 07/23/15   [name], MD  polyethylene glycol powder (GLYCOLAX/MIRALAX) powder Take 17 g by mouth daily as needed for moderate constipation. Patient not taking: Reported on 03/17/2016 04/18/15   Ria Bush, MD   BP 144/70 mmHg  Pulse 64  Temp(Src) 97.7 F (36.5 C) (Rectal)  Resp 18  Ht 5' 2.5" (1.588 m)  Wt 145 lb (65.772 kg)  BMI 26.08 kg/m2  SpO2 99% Physical Exam  Constitutional: She is oriented to person, place, and time. She appears well-developed.  Elderly, frail  HENT:  Head: Normocephalic and atraumatic.  Right Ear: External ear normal.  Left Ear: External ear normal.  Eyes: Conjunctivae and EOM are normal. Pupils are equal, round, and reactive to light.  Neck: Normal range of motion and phonation normal. Neck supple.  Cardiovascular: Normal rate, regular rhythm and normal heart sounds.   Pulmonary/Chest: Effort normal and breath sounds normal. She exhibits no bony tenderness.  Abdominal: Soft. There is no tenderness.  Musculoskeletal: Normal range of motion.  Neurological: She is alert and oriented to person, place, and time. No cranial nerve deficit or sensory deficit. She exhibits normal muscle tone. Coordination normal.  Mild benign-appearing tremor. No dysarthria,  aphasia or nystagmus  Skin: Skin is warm, dry and intact.  No visible rash on trunk or extremities. Left thumb, nail has been removed, the nailbed and the cuticle appear normal without associated redness, swelling, drainage, or deformity  Psychiatric: She has a normal mood and affect. Her behavior is normal. Judgment and thought content normal.  Nursing note and vitals reviewed.   ED Course  Procedures (including critical care time)  Initial clinical impression- nonspecific, chills, without clear evidence for infection or suggestion for a possible source.   Medications - No data to display  Patient Vitals for the past 24 hrs:  BP Temp Temp src Pulse Resp SpO2 Height Weight  03/17/16 1731 - 97.7 F (36.5 C) Rectal - - - - -  03/17/16 1655 144/70 mmHg 98 F (36.7 C) Oral 64 - 99 % - -  03/17/16 1533 156/78 mmHg 98.3 F (36.8 C) Oral 72 18 98 % 5' 2.5" (1.588 m) 145 lb (65.772 kg)    7:02 PM Reevaluation with update and discussion. After initial assessment and treatment, an updated evaluation reveals no change in clinical status. She remains comfortable. She has not had a shaking episodes in the ED. Findings discussed with patient and husband, all questions were answered. Camara Rosander L   Labs Review Labs Reviewed  BASIC METABOLIC PANEL - Abnormal; Notable for the following:    CO2 20 (*)    Glucose, Bld 104 (*)    All other components within normal limits  CULTURE, BLOOD (ROUTINE X 2)  CULTURE, BLOOD (ROUTINE X 2)  CBC  URINALYSIS, ROUTINE W REFLEX MICROSCOPIC (NOT AT [facility])  I-STAT CG4 LACTIC ACID, ED    Imaging Review No results found. I have personally reviewed and evaluated these images and lab results as part of my medical decision-making.   EKG Interpretation   Date/Time:  Tuesday Mar 17 2016 16:02:13 EDT Ventricular Rate:  77 PR Interval:  160 QRS Duration: 70 QT Interval:  374 QTC Calculation: 423 R Axis:   12 Text Interpretation:  Normal sinus rhythm Cannot  rule out Anterior infarct  , age undetermined Abnormal ECG Since last tracing rate slower Confirmed  by Eulis Foster  MD,  Bernice Mullin [phone]) on 03/17/2016 6:43:52 PM      MDM   Final diagnoses:  Episode of shaking    Nonspecific shaking, most likely related to her chronic illness, multi-system atrophy. This disease affects the autonomic nervous system. There are also some cortical abnormalities related atrophy, which can cause shaking-like syndrome. The patient is not currently debilitated by this shaking, therefore, can be discharged with outpatient follow-up and assessment. There is no apparent evidence for acute bacterial illness, metabolic instability or suggestion for impending vascular collapse.   Nursing Notes Reviewed/ Care Coordinated Applicable Imaging Reviewed Interpretation of Laboratory Data incorporated into ED treatment  The patient appears reasonably screened and/or stabilized for discharge and I doubt any other medical condition or other [facility] requiring further screening, evaluation, or treatment in the ED at this time prior to discharge.  Plan: Home Medications- usual; Home Treatments- rest; return here if the recommended treatment, does not improve the symptoms; Recommended follow up- PCP prn     Daleen Bo, MD 03/17/16 [phone]

## 2016-03-17 NOTE — ED Notes (Signed)
Onset 3 months intermittent low temperature and chills. Seen Primary Doctor twice called today sent to ED for evaluation. Had left thumb nail removed 2 weeks ago and taking Fluconazole. Having general weakness.

## 2016-03-17 NOTE — Telephone Encounter (Signed)
Patient Name: [name] [name] DOB: 1941/08/30 Initial Comment Caller states she is having chills Nurse Assessment Nurse: Vallery Sa, RN, Cathy Date/Time (Eastern Time): 03/17/2016 1:57:25 PM Confirm and document reason for call. If symptomatic, describe symptoms. You must click the next button to save text entered. ---[name] states she never runs a fever, but she has had chills for several months. She is on antibiotics for a fungus infection in her thumb and feels she is worse today. Alert and responsive. Has the patient traveled out of the country within the last 30 days? ---No Does the patient have any new or worsening symptoms? ---Yes Will a triage be completed? ---Yes Related visit to physician within the last 2 weeks? ---Yes Does the PT have any chronic conditions? (i.e. diabetes, asthma, etc.) ---Yes List chronic conditions. ---Fungal Infection, Neurological Disease, Heart Blockages Is this a behavioral health or substance abuse call? ---No Guidelines Guideline Title Affirmed Question Affirmed Notes Infection on Antibiotic Follow-up Call MODERATE difficulty breathing (e.g., speaks in phrases, SOB even at rest, pulse 100-120) Final Disposition User Go to ED Now (or PCP triage) Vallery Sa, RN, [facility] - ED Disagree/Comply: Comply

## 2016-03-18 NOTE — Telephone Encounter (Signed)
Message left for patient to return my call.  

## 2016-03-18 NOTE — Telephone Encounter (Signed)
Reviewed ED note.  Last visit we set patient up with [facility] - has Arville Go come out to house to provide further resources and what was result of evaluation?

## 2016-03-19 NOTE — Telephone Encounter (Signed)
Message left for patient to return my call.  

## 2016-03-20 ENCOUNTER — Institutional Professional Consult (permissible substitution): Payer: Self-pay | Admitting: Internal Medicine

## 2016-03-22 LAB — CULTURE, BLOOD (ROUTINE X 2): CULTURE: NO GROWTH

## 2016-03-24 NOTE — Telephone Encounter (Signed)
Spoke with patient's husband. He said the only way [name] will come out is if they could do PT/rehab. Pt is currently doing rehab at [facility] and she wants to continue going there to get out of the house and to keep her routine. So, currently HH is on hold. He was talking with Jenny Reichmann @ [name] and she said just to let her know when they changed her mind.

## 2016-03-26 ENCOUNTER — Telehealth: Payer: Self-pay | Admitting: Family Medicine

## 2016-03-26 NOTE — Telephone Encounter (Signed)
Pt called, she has had a tooth to fall out today.  She called her dentist office, ubt they are closed until Monday.  She did get an appointment for Monday 03/30/2016, but wanted to know if she should be taking an antibiotic such as amoxicillin.  Please advise, best number to call patient is [phone]

## 2016-03-27 NOTE — Telephone Encounter (Signed)
Called 3x, left messages. She has appt with dentist on Monday. I don't think she needs abx as long as no pain or fever.

## 2016-04-02 ENCOUNTER — Ambulatory Visit: Payer: Self-pay | Admitting: Family Medicine

## 2016-04-02 DIAGNOSIS — L03019 Cellulitis of unspecified finger: Secondary | ICD-10-CM | POA: Diagnosis not present

## 2016-04-03 ENCOUNTER — Ambulatory Visit: Payer: Self-pay | Admitting: Family Medicine

## 2016-04-10 ENCOUNTER — Encounter: Payer: Self-pay | Admitting: Cardiovascular Disease

## 2016-04-10 ENCOUNTER — Ambulatory Visit (INDEPENDENT_AMBULATORY_CARE_PROVIDER_SITE_OTHER): Payer: Medicare Other | Admitting: Cardiovascular Disease

## 2016-04-10 ENCOUNTER — Other Ambulatory Visit: Payer: Self-pay | Admitting: Family Medicine

## 2016-04-10 VITALS — BP 130/80 | HR 68 | Ht 62.5 in

## 2016-04-10 DIAGNOSIS — I2510 Atherosclerotic heart disease of native coronary artery without angina pectoris: Secondary | ICD-10-CM

## 2016-04-10 NOTE — Telephone Encounter (Signed)
Rx called in as directed.   

## 2016-04-10 NOTE — Patient Instructions (Signed)
Medication Instructions:  Your physician recommends that you continue on your current medications as directed. Please refer to the Current Medication list given to you today.  Labwork: No new orders.   Testing/Procedures: No new orders.   Follow-Up: Your physician wants you to follow-up in: November with Dr Burt Knack.  You will receive a reminder letter in the mail two months in advance. If you don't receive a letter, please call our office to schedule the follow-up appointment.   Any Other Special Instructions Will Be Listed Below (If Applicable).     If you need a refill on your cardiac medications before your next appointment, please call your pharmacy.

## 2016-04-10 NOTE — Progress Notes (Signed)
Cardiology Office Note Date:  04/10/2016   ID:  [name] [name], DOB 08/15/1941, MRN [id number]  PCP:  Ria Bush, MD  Cardiologist:  [name], MD    Chief Complaint  Patient presents with  . coronary heart disease   History of Present Illness: [name] [name] is a 75 y.o. female who presents for followup evaluation. She is followed for coronary artery disease. 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 She is wheelchair bound.   The patient is here with her husband today. She continues to do well from a cardiac perspective. She tells me that her multiple systems atrophy has progressed over the last year. She denies chest pain, palpitations, or lightheadedness. She is still able to do a little bit of walking and describes some shortness of breath with this. She also has leg swelling and has been wearing compression stocking at times. No orthopnea or PND.  Past Medical History  Diagnosis Date  . GERD (gastroesophageal reflux disease)     s/p nissen  . Familial tremor     followed by Dr. Erling Cruz  . Unspecified chronic bronchitis ([place])   . Neurogenic bladder     Tannenbaum/MacDiarmid  . History of pneumonia   . Hypothyroidism   . Depression   . Asthma   . CAD (coronary artery disease)     myoview 5/08:  EF 76%, no scar, no ischemia, +ECG changes with exercise;  cath 04/08/07:  pLAD 30%, mLAD 60-70% - med tx.  Marland Kitchen HLD (hyperlipidemia)   . Multiple system atrophy [facility])     [facility], now [name]  . VAGINITIS, ATROPHIC 11/07/2007  . Osteoporosis 2016    DEXA T-4.4 spine deteriorated since 2012  . Chronic cystitis     recurrent UTIs, started bactrim ppx ([place])  . Internal hemorrhoids   . Hydronephrosis, bilateral     Past Surgical History  Procedure Laterality Date  . Laparoscopic nissen fundoplication    . Cystectomy    . Tumor removal    . Tubal ligation    . Colonoscopy N/A 08/29/2013    int  hemm; Inda Castle, MD  . Dexa  12/2014    T -4.4 spine  . Esophagogastroduodenoscopy N/A 01/09/2015    Procedure: ESOPHAGOGASTRODUODENOSCOPY (EGD);  Surgeon: Inda Castle, MD;  Location: [facility];  Service: Endoscopy;  Laterality: N/A;  help with transfers    Current Outpatient Prescriptions  Medication Sig Dispense Refill  . albuterol (PROAIR HFA) 108 (90 BASE) MCG/ACT inhaler Inhale 2 puffs into the lungs every 6 (six) hours as needed for wheezing or shortness of breath. Reported on 01/22/2016    . aspirin (ASPIRIN EC) 81 MG EC tablet Take 81 mg by mouth 2 (two) times a week. Swallow whole. Take at bedtime    . cephALEXin (KEFLEX) 250 MG capsule Take 250 mg by mouth every morning.    . clonazePAM (KLONOPIN) 0.5 MG tablet Take 0.5 mg by mouth 2 (two) times daily as needed (tremors).    . Cranberry (ELLURA) 200 MG CAPS Take 1 capsule by mouth every evening.     Marland Kitchen CRESTOR 20 MG tablet TAKE ONE-HALF TABLET BY MOUTH ONCE EVERY EVENING 45 tablet 3  . cyanocobalamin (,VITAMIN B-12,) 1000 MCG/ML injection Inject 1,000 mcg into the muscle every 30 (thirty) days. Take the first of the month    . dexlansoprazole (DEXILANT) 60 MG capsule Take 60 mg by mouth daily as needed. For acid  reflux    . famotidine (PEPCID) 40 MG tablet Take 40 mg by mouth 2 (two) times daily.    . furosemide (LASIX) 20 MG tablet Take 1 tablet (20 mg total) by mouth daily as needed for fluid or edema. 30 tablet 0  . hyoscyamine (LEVSIN SL) 0.125 MG SL tablet PLACE 2 TABLETS (0.25 MG TOTAL) UNDER THE TONGUE EVERY 4 (FOUR) HOURS AS NEEDED (SPASMS). 30 tablet 5  . isosorbide mononitrate (IMDUR) 30 MG 24 hr tablet Take 15 mg by mouth at bedtime.    Marland Kitchen MYRBETRIQ 50 MG TB24 tablet Take 50 mg by mouth daily.    . nitroGLYCERIN (NITROSTAT) 0.4 MG SL tablet Place 1 tablet (0.4 mg total) under the tongue every 5 (five) minutes as needed (chest pain). 25 tablet 3  . polyethylene glycol powder (GLYCOLAX/MIRALAX) powder Take 17 g by  mouth daily as needed for moderate constipation. 250 g 1  . propranolol (INDERAL) 10 MG tablet TAKE 1 TABLET BY MOUTH 3 TIMES DAILY AS NEEDED (TREMORS). 180 tablet 3  . sertraline (ZOLOFT) 25 MG tablet Take 1 tablet (25 mg total) by mouth daily. 90 tablet 3  . SYNTHROID 112 MCG tablet TAKE 1 TABLET (112 MCG TOTAL) BY MOUTH DAILY BEFORE BREAKFAST. 30 tablet 6   No current facility-administered medications for this visit.   Allergies:   Keflex   Social History:  The patient  reports that she has never smoked. She has never used smokeless tobacco. She reports that she does not drink alcohol or use illicit drugs.   Family History:  The patient's  family history includes Breast cancer in her maternal aunt; Colon cancer in her maternal aunt; Coronary artery disease in her daughter and father; Diabetes in her daughter; Heart attack in her daughter and father; Heart disease in her father; Hypertension in her mother; Multiple sclerosis in her daughter; Stroke in her daughter.    ROS:  Please see the history of present illness.  Otherwise, review of systems is positive for tremor, leg swelling, hearing loss, fatigue, leg pain, constipation, balance problems.  All other systems are reviewed and negative.    PHYSICAL EXAM: VS:  BP 130/80 mmHg  Pulse 68  Ht 5' 2.5" (1.588 m)  Wt  , BMI There is no weight on file to calculate BMI. GEN: Pleasant woman in no acute distress HEENT: normal Neck: no JVD, no masses. No carotid bruits Cardiac: RRR without murmur or gallop                Respiratory:  clear to auscultation bilaterally, normal work of breathing GI: soft, nontender, nondistended, + BS MS: no deformity or atrophy Ext: 1+ pretibial edema bilaterally Skin: warm and dry, no rash Neuro:  Strength and sensation are intact Psych: euthymic mood, full affect  EKG:  EKG is not ordered today.  Recent Labs: 08/30/2015: Magnesium 2.1 02/04/2016: ALT 15; Pro B Natriuretic peptide (BNP) 73.0; TSH  0.64 03/17/2016: BUN 15; Creatinine, Ser 0.72; Hemoglobin 13.3; Platelets 249; Potassium 4.2; Sodium 140   Lipid Panel     Component Value Date/Time   CHOL 130 11/20/2014 1301   TRIG 115.0 11/20/2014 1301   HDL 43.90 11/20/2014 1301   CHOLHDL 3 11/20/2014 1301   VLDL 23.0 11/20/2014 1301   LDLCALC 63 11/20/2014 1301   LDLDIRECT 161.5 06/09/2010 1517      Wt Readings from Last 3 Encounters:  03/17/16 145 lb (65.772 kg)  02/04/16 152 lb 8 oz (69.174 kg)  04/03/15 145  lb (65.772 kg)    ASSESSMENT AND PLAN: 1.  CAD, native vessel: no angina. Continue ASA, statin, imdur, beta-blocker. She's tolerating medical therapy well.   2. HTN: BP well-controlled.  3. Hyperlipidemia: recent LFT's reviewed. Last lipids reviewed.   Current medicines are reviewed with the patient today.  The patient does not have concerns regarding medicines.  Labs/ tests ordered today include:  No orders of the defined types were placed in this encounter.    Disposition:   FU 6 months  Signed, [name], MD  04/10/2016 3:04 PM    [facility] [place], [place], [place]  [address] Phone: [phone]; Fax: [phone]

## 2016-04-10 NOTE — Telephone Encounter (Signed)
Ok to refill 

## 2016-04-10 NOTE — Telephone Encounter (Signed)
plz phone in. 

## 2016-04-13 ENCOUNTER — Telehealth: Payer: Self-pay | Admitting: Neurology

## 2016-04-13 ENCOUNTER — Institutional Professional Consult (permissible substitution): Payer: Self-pay | Admitting: Internal Medicine

## 2016-04-13 NOTE — Telephone Encounter (Addendum)
Sarah/Dr Feliberto Harts [phone] called said pt will need a tooth extraction. Is there any contraindication? Please call ( This can wait until Dr Rexene Alberts returns)

## 2016-04-13 NOTE — Telephone Encounter (Signed)
What do you advise?

## 2016-04-13 NOTE — Telephone Encounter (Signed)
I called number below back. Judson Roch was unavailable. I left message for Judson Roch with recommendations below.

## 2016-04-13 NOTE — Telephone Encounter (Signed)
Neurologically, no contraindication to tooth extraction, should double check with PCP for any medical contraindications. Please call Sarah back.

## 2016-04-27 ENCOUNTER — Ambulatory Visit (INDEPENDENT_AMBULATORY_CARE_PROVIDER_SITE_OTHER): Payer: Medicare Other | Admitting: Family Medicine

## 2016-04-27 ENCOUNTER — Encounter: Payer: Self-pay | Admitting: Family Medicine

## 2016-04-27 VITALS — BP 128/74 | HR 68 | Temp 97.5°F

## 2016-04-27 DIAGNOSIS — E538 Deficiency of other specified B group vitamins: Secondary | ICD-10-CM | POA: Diagnosis not present

## 2016-04-27 DIAGNOSIS — G239 Degenerative disease of basal ganglia, unspecified: Secondary | ICD-10-CM

## 2016-04-27 DIAGNOSIS — R6883 Chills (without fever): Secondary | ICD-10-CM

## 2016-04-27 DIAGNOSIS — G903 Multi-system degeneration of the autonomic nervous system: Secondary | ICD-10-CM

## 2016-04-27 MED ORDER — CYANOCOBALAMIN 1000 MCG/ML IJ SOLN
1000.0000 ug | Freq: Once | INTRAMUSCULAR | Status: AC
  Administered 2016-04-27: 1000 ug via INTRAMUSCULAR

## 2016-04-27 NOTE — Progress Notes (Signed)
BP 128/74 mmHg  Pulse 68  Temp(Src) 97.5 F (36.4 C) (Oral)   CC: f/u visit  Subjective:    Patient ID: [name], female    DOB: 24-Nov-1940, 75 y.o.   MRN: [id number]  HPI: [name] [name] is a 75 y.o. female presenting on 04/27/2016 for Follow-up   MSA - worsening chills thought manifestation of autonomic instability. Subsequently seen at ER for shaking spell - also though progression of MSA. Worsening urinary incontinence. Husband continues having difficulty accepting progressive disease process.  Recent dental surgery upper L premolar recently OMFS. Has recovered well from this.   HH did come out to the house - but she did not qualify for home health because she continues attending outpatient physical therapy.   Has appt with Dr Rexene Alberts tomorrow  Relevant past medical, surgical, family and social history reviewed and updated as indicated. Interim medical history since our last visit reviewed. Allergies and medications reviewed and updated. Current Outpatient Prescriptions on File Prior to Visit  Medication Sig  . albuterol (PROAIR HFA) 108 (90 BASE) MCG/ACT inhaler Inhale 2 puffs into the lungs every 6 (six) hours as needed for wheezing or shortness of breath. Reported on 01/22/2016  . aspirin (ASPIRIN EC) 81 MG EC tablet Take 81 mg by mouth 2 (two) times a week. Swallow whole. Take at bedtime  . cephALEXin (KEFLEX) 250 MG capsule Take 250 mg by mouth every morning. Reported on 04/27/2016  . clonazePAM (KLONOPIN) 0.5 MG tablet Take 0.5 mg by mouth 2 (two) times daily as needed (tremors).  . Cranberry (ELLURA) 200 MG CAPS Take 1 capsule by mouth every evening.   Marland Kitchen CRESTOR 20 MG tablet TAKE ONE-HALF TABLET BY MOUTH ONCE EVERY EVENING  . cyanocobalamin (,VITAMIN B-12,) 1000 MCG/ML injection Inject 1,000 mcg into the muscle every 30 (thirty) days. Take the first of the month  . dexlansoprazole (DEXILANT) 60 MG capsule Take 60 mg by mouth daily as needed. For acid reflux  .  famotidine (PEPCID) 40 MG tablet Take 40 mg by mouth 2 (two) times daily.  . furosemide (LASIX) 20 MG tablet Take 1 tablet (20 mg total) by mouth daily as needed for fluid or edema.  . hyoscyamine (LEVSIN SL) 0.125 MG SL tablet PLACE 2 TABLETS (0.25 MG TOTAL) UNDER THE TONGUE EVERY 4 (FOUR) HOURS AS NEEDED (SPASMS).  . isosorbide mononitrate (IMDUR) 30 MG 24 hr tablet Take 15 mg by mouth at bedtime.  Marland Kitchen MYRBETRIQ 50 MG TB24 tablet Take 50 mg by mouth daily.  . nitroGLYCERIN (NITROSTAT) 0.4 MG SL tablet Place 1 tablet (0.4 mg total) under the tongue every 5 (five) minutes as needed (chest pain).  . polyethylene glycol powder (GLYCOLAX/MIRALAX) powder Take 17 g by mouth daily as needed for moderate constipation.  . propranolol (INDERAL) 10 MG tablet TAKE 1 TABLET BY MOUTH 3 TIMES DAILY AS NEEDED (TREMORS).  Marland Kitchen sertraline (ZOLOFT) 25 MG tablet Take 1 tablet (25 mg total) by mouth daily.  Marland Kitchen SYNTHROID 112 MCG tablet TAKE 1 TABLET (112 MCG TOTAL) BY MOUTH DAILY BEFORE BREAKFAST.   No current facility-administered medications on file prior to visit.    Review of Systems Per HPI unless specifically indicated in ROS section     Objective:    BP 128/74 mmHg  Pulse 68  Temp(Src) 97.5 F (36.4 C) (Oral)  Wt Readings from Last 3 Encounters:  03/17/16 145 lb (65.772 kg)  02/04/16 152 lb 8 oz (69.174 kg)  04/03/15 145 lb (65.772 kg)  Physical Exam  Constitutional: She appears well-developed and well-nourished. No distress.  Sitting in wheelchair  HENT:  Head: Normocephalic and atraumatic.  Mouth/Throat: Oropharynx is clear and moist. No oropharyngeal exudate.  Cardiovascular: Normal rate, regular rhythm, normal heart sounds and intact distal pulses.   No murmur heard. Pulmonary/Chest: Effort normal and breath sounds normal. No respiratory distress. She has no wheezes. She has no rales.  Musculoskeletal: She exhibits edema (1+).  Skin: Skin is warm and dry. No rash noted.  Psychiatric: She  has a normal mood and affect.  Always bright affect  Nursing note and vitals reviewed.  Results for orders placed or performed during the hospital encounter of 03/17/16  Culture, blood (routine x 2)  Result Value Ref Range   Specimen Description BLOOD RIGHT ANTECUBITAL    Special Requests BOTTLES DRAWN AEROBIC AND ANAEROBIC 5CC    Culture NO GROWTH 5 DAYS    Report Status 03/22/2016 FINAL   Basic metabolic panel  Result Value Ref Range   Sodium 140 135 - 145 mmol/L   Potassium 4.2 3.5 - 5.1 mmol/L   Chloride 110 101 - 111 mmol/L   CO2 20 (L) 22 - 32 mmol/L   Glucose, Bld 104 (H) 65 - 99 mg/dL   BUN 15 6 - 20 mg/dL   Creatinine, Ser 0.72 0.44 - 1.00 mg/dL   Calcium 9.6 8.9 - 10.3 mg/dL   GFR calc non Af Amer >60 >60 mL/min   GFR calc Af Amer >60 >60 mL/min   Anion gap 10 5 - 15  CBC  Result Value Ref Range   WBC 5.5 4.0 - 10.5 K/uL   RBC 4.56 3.87 - 5.11 MIL/uL   Hemoglobin 13.3 12.0 - 15.0 g/dL   HCT 40.0 36.0 - 46.0 %   MCV 87.7 78.0 - 100.0 fL   MCH 29.2 26.0 - 34.0 pg   MCHC 33.3 30.0 - 36.0 g/dL   RDW 13.0 11.5 - 15.5 %   Platelets 249 150 - 400 K/uL  Urinalysis, Routine w reflex microscopic  Result Value Ref Range   Color, Urine YELLOW YELLOW   APPearance CLEAR CLEAR   Specific Gravity, Urine 1.008 1.005 - 1.030   pH 6.5 5.0 - 8.0   Glucose, UA NEGATIVE NEGATIVE mg/dL   Hgb urine dipstick NEGATIVE NEGATIVE   Bilirubin Urine NEGATIVE NEGATIVE   Ketones, ur NEGATIVE NEGATIVE mg/dL   Protein, ur NEGATIVE NEGATIVE mg/dL   Nitrite NEGATIVE NEGATIVE   Leukocytes, UA NEGATIVE NEGATIVE  I-Stat CG4 Lactic Acid, ED  Result Value Ref Range   Lactic Acid, Venous 0.86 0.5 - 2.0 mmol/L      Assessment & Plan:   Problem List Items Addressed This Visit    Multiple system atrophy ([place]) - Primary    Reviewed MSA symptoms. Support provided.       Vitamin B12 deficiency    Requests B12 shot today - provided.      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  Ria Bush, MD

## 2016-04-27 NOTE — Assessment & Plan Note (Signed)
Temperature dysregulation manifestation of autonomic dysfunction due to MSA

## 2016-04-27 NOTE — Progress Notes (Signed)
Pre visit review using our clinic review tool, if applicable. No additional management support is needed unless otherwise documented below in the visit note. 

## 2016-04-27 NOTE — Assessment & Plan Note (Signed)
Requests B12 shot today - provided.

## 2016-04-27 NOTE — Assessment & Plan Note (Signed)
Reviewed MSA symptoms. Support provided.

## 2016-04-27 NOTE — Addendum Note (Signed)
Addended by: Royann Shivers A on: 04/27/2016 03:59 PM   Modules accepted: Orders

## 2016-04-27 NOTE — Patient Instructions (Addendum)
Good to see you today. Keep appointment with Dr Rexene Alberts - discuss possible physical therapy extension with her.  Return in 3 months for medicare wellness visit b12 shot today.

## 2016-04-28 ENCOUNTER — Encounter: Payer: Self-pay | Admitting: Neurology

## 2016-04-28 ENCOUNTER — Ambulatory Visit (INDEPENDENT_AMBULATORY_CARE_PROVIDER_SITE_OTHER): Payer: Medicare Other | Admitting: Neurology

## 2016-04-28 VITALS — BP 122/70 | HR 72 | Resp 16

## 2016-04-28 DIAGNOSIS — M62838 Other muscle spasm: Secondary | ICD-10-CM | POA: Diagnosis not present

## 2016-04-28 DIAGNOSIS — G239 Degenerative disease of basal ganglia, unspecified: Secondary | ICD-10-CM | POA: Diagnosis not present

## 2016-04-28 DIAGNOSIS — R6883 Chills (without fever): Secondary | ICD-10-CM | POA: Diagnosis not present

## 2016-04-28 DIAGNOSIS — G903 Multi-system degeneration of the autonomic nervous system: Secondary | ICD-10-CM

## 2016-04-28 NOTE — Patient Instructions (Signed)
We can re-try the baclofen 10 mg strength for muscle spasms, take 1/2 pill up to 3 times a day. You may still have the prescription.  For your chills, you can try a blanket, warm beverage.  We will re-order outpatient PT with Vinnie Level.

## 2016-04-28 NOTE — Progress Notes (Signed)
Subjective:    Patient ID: [name] [name] is a 75 y.o. female.  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 She is accompanied by her husband again today. I last saw her on 09/18/2015, at which time she reported muscle spasms in various places. They did not feel like cramps. She was in the interim seen by [name] [name], nurse practitioner, on 01/22/2016.   Today, 04/28/2016: She reports having had chills, intermittent, stays cold all the time, Thyroid function was checked in March 2017. Does not sleep very well. His wheelchair-bound, helps to prevent but otherwise cannot walk any longer. Husband has having a hard time caring for her. He has lost weight, reports stress. She saw her cardiologist earlier this month, was doing well from a cardiac standpoint. She saw her primary care physician yesterday and received a B12 shot. She reported intermittent chills. In fact, she went to the emergency room on 03/17/2016 for uncontrollable chills. She was discharged from the emergency room. I reviewed her primary care office note from yesterday and her ER visit note from 03/17/2016.   Previously:  I saw her on 07/29/2015, at which time she reported doing fairly well. No choking, no recent falls and he her husband was helping her with mobilizing with a walker. She had to go to the emergency room on 04/11/2015 for UTI. She was in outpatient physical therapy. She was seeing her urologist regularly and they had talked about Botox injections and potentially a surgically placed catheter. She had no headaches. She had a follow-up phone call from the [facility] about a  potential research study but she did not get encouragement to pursue it from the doctor. In the interim, she calls in October 2016 reporting a 2-4 day history of increased muscle spasms. She felt that she had spasms all over. She was encouraged to see her primary care provider. She was seen and had blood work which showed no abnormality of her electrolytes, kidney function. Her magnesium level was normal.    I saw her on 03/27/2015, at which time she reported new left hand numbness for the past 1 month. She woke up with left hand symptoms. She had some numbness and tingling. Her husband noted that she was leaning to the left. Her last brain MRI was on 07/30/2015 which showed no acute abnormality. I reviewed the MRI last time. She denied any slurring of speech, droopy face, lower extremity weakness. She had some lower extremity swelling which was worse. Her left hand numbness improved after a few days. Her leaning improved. She had finished physical therapy in April 2016 and felt it was helpful and wondered if she could restart it. I referred her back to physical therapy outpatient. I suggested we proceed with a brain MRI. She had a brain MRI without contrast on 04/23/2015 and we called her subsequently with her test results: Abnormal MRI brain (without) demonstrating:   1. Small left fronto-parietal, parasagittal focus of encephalomalacia and gliosis, possible a chronic ischemic infarction.   2. Few scattered periventricular foci of non-specific gliosis. 3. No acute findings. 4. No significant change from MRI on 07/29/12.  In addition, I personally reviewed the images through the  PACS system.  I saw her on 12/11/2014, at which time she reported feeling about the same as far as her motor symptoms. She had more freezing, especially in her right leg. She was in physical therapy and felt it was helpful. She was trying to do stretching at home. Her husband reported that he does not feel comfortable leaving her  at home alone. They were in touch with [facility] over the phone but there were no updates on research for multiple system atrophy. She has a call alert button. She had no recent falls. She was having trouble going to sleep. She was sleeping in her lift chair. She had new reading glasses. She had diarrhea in the past when she tried Linzess through her GI doctor. She requested a sooner than scheduled appointment due to abnormal posture and left-sided numbness.  I saw her on 07/05/2014, at which time she reported having had more falls. She had not been seen at the [facility] clinic. Stem cell trial was discussed and another trial in [place] with alpha-synuclein depleting therapy at the last visit there. She was eating well and her spirits were good. We restarted outpatient physical therapy and she has been going to the neuro rehabilitation unit.   I saw her on 02/02/2014, at which time she reported that physical therapy was helpful. She had fallen a few times and bruised her ribs. She had a colonoscopy on 08/29/2013. She reported a history of hemorrhoids and chronic constipation for which she had tried Dulcolax. I referred her back to physical therapy. 4 chronic constipation I asked her to try Linzess. I did not suggest any other new medications. She goes to the [facility] once a year and had an appointment in on 04/27/14 and I reviewed the note and shared the data with them. She had previously tried and failed Sinemet.   I saw her on 09/05/2013, at which time she presented for a sooner than scheduled appointment due to recurrent falls. She had fallen 6 times within about 1-1/2 weeks' time. 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 I advised her to use her walker at all times. We talked about potentially going to a motorized wheelchair down the [place]. She was advised to drink more water. She had tried  and failed Sinemet in the past. I suggested a second opinion at [facility] which they declined. I referred her to physical and occupational therapy outpatient. In the interim, I prescribed a light weight wheelchair for her. Her daughter recently died. She had advanced MS.   I first met her on 05/10/2013, at which time we discussed the possibility of MSA. I did not make any medication changes. We talked about the nature of progressive parkinsonism and gait disorder. She had her last outpatient PT in July 2014. She tried to do stretching at home. She had a colonoscopy.   She previously followed with Dr. Morene Antu and was last seen by him on 12/30/2012, at which time he felt that she had worsening of her gait. She also had symptoms of vertigo. She has been on baby aspirin, Synthroid, Crestor, and/or, clonazepam, propranolol, sertraline, trimethoprim, Nasonex, Provera, nitroglycerin as needed, vitamin B12.   She was diagnosed about 20 years ago with essential tremor. She had head tremor more than upper extremity tremor. She has a history of lung disease but was able to take low-dose propranolol. She has not  tried primidone. She has had gait and balance problems in the last 5+ years approximately. She has to hold onto something. She has had bladder incontinence since her 43s. She was on Crestor for 3 years which was discontinued in June 2011. CK was normal and EMG nerve conduction studies were normal in June 2011. Crestor was restarted. She has been on B12 injections without subjective improvement. MRI brain with and without contrast showed hyperostosis frontalis, MRI C-spine showed mild degenerative joint disease. 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 Lumbar spine MRI from July 2012 showed mild degenerative disc disease. 4 gait dysfunction she was referred to Dr. Gilford Rile with a question of myelopathy versus orthostatic tremor versus lower half parkinsonism versus  essential tremor with gait disorder. 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 Further testing revealed negative tilt table test but there was mild autonomic neuropathy per autonomic reflex screen. She has had swelling in both legs with negative Doppler of the legs and normal echocardiogram. Additional workup at [facility] included vitamin D, paraneoplastic antibodies, anti-GAD, all negative.   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 She has been using a 2 wheeled walker for the past 2 years.   She has been in PT and OT with improvement.   She endorsed a lot of stress, due primarily to her mother's and daughter's health, who died in 2014/02/16. Mother is in her 23s and in a NH. She sleeps in a recliner, d/t pain in her trunk, back, neck and stiffness and difficulty getting in and out of bed.  Her Past Medical History Is Significant For: Past Medical History  Diagnosis Date  . GERD (gastroesophageal reflux disease)     s/p nissen  . Familial tremor     followed by Dr. Erling Cruz  . Unspecified chronic bronchitis ([place])   . Neurogenic bladder     Tannenbaum/MacDiarmid  . History of pneumonia   . Hypothyroidism   . Depression   . Asthma   . CAD (coronary artery disease)     myoview 5/08:  EF 76%, no scar, no ischemia, +ECG changes with exercise;  cath 04/08/07:  pLAD 30%, mLAD 60-70% - med tx.  Marland Kitchen HLD (hyperlipidemia)   . 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 VAGINITIS, ATROPHIC 11/07/2007  . Osteoporosis 2016    DEXA T-4.4 spine deteriorated since 2012  . Chronic cystitis     recurrent UTIs, started bactrim ppx (McDiarmid)  . Internal hemorrhoids   . Hydronephrosis, bilateral     Her Past Surgical History Is  Significant For: Past Surgical History  Procedure Laterality Date  . Laparoscopic nissen fundoplication    . Cystectomy    . Tumor removal    . Tubal ligation    . Colonoscopy N/A 08/29/2013    int hemm; Inda Castle, MD  . Dexa  12/2014    T -4.4 spine  . Esophagogastroduodenoscopy N/A 01/09/2015    Procedure: ESOPHAGOGASTRODUODENOSCOPY (EGD);  Surgeon: Inda Castle, MD;  Location: [facility];  Service: Endoscopy;  Laterality: N/A;  help with transfers    Her Family History Is Significant For:  Family History  Problem Relation Age of Onset  . Coronary artery disease Father   . Heart attack Father   . Diabetes Daughter   . Breast cancer Maternal Aunt   . Hypertension Mother   . Heart disease Father   . Coronary artery disease Daughter   . Colon cancer Maternal Aunt   . Multiple sclerosis Daughter   . Stroke Daughter   . Heart attack Daughter     Her Social History Is Significant For: Social History   Social History  . Marital Status: Married    Spouse Name: [name]  . Number of Children: 2  . Years of Education: HS   Occupational History  .  Other   Social History Main Topics  . Smoking status: Never Smoker   . Smokeless tobacco: Never Used  . Alcohol Use: No  . Drug Use: No  . Sexual Activity: Not Currently   Other Topics Concern  . None   Social History Narrative   HSG, retired from [employer] . married '60. 1 son - '65; 1 daughter - '61; 2 grandchildren; 1 great-grandchild. Homemaker. marriage in good health. 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 Positive history of passive tobacco smoke exposure.   Caffeine Use: 1 cup daily; 2 glasses of tea daily    Her Allergies Are:  Allergies  Allergen Reactions  . Keflex [Cephalexin] Diarrhea  :   Her Current Medications Are:  Outpatient Encounter Prescriptions as of 04/28/2016  Medication Sig  . albuterol (PROAIR HFA) 108 (90 BASE) MCG/ACT inhaler Inhale 2 puffs into the lungs every 6 (six) hours as needed for  wheezing or shortness of breath. Reported on 01/22/2016  . aspirin (ASPIRIN EC) 81 MG EC tablet Take 81 mg by mouth 2 (two) times a week. Swallow whole. Take at bedtime  . cephALEXin (KEFLEX) 250 MG capsule Take 250 mg by mouth every morning. Reported on 04/27/2016  . clonazePAM (KLONOPIN) 0.5 MG tablet Take 0.5 mg by mouth 2 (two) times daily as needed (tremors).  . Cranberry (ELLURA) 200 MG CAPS Take 1 capsule by mouth every evening.   Marland Kitchen CRESTOR 20 MG tablet TAKE ONE-HALF TABLET BY MOUTH ONCE EVERY EVENING  . cyanocobalamin (,VITAMIN B-12,) 1000 MCG/ML injection Inject 1,000 mcg into the muscle every 30 (thirty) days. Take the first of the month  . dexlansoprazole (DEXILANT) 60 MG capsule Take 60 mg by mouth daily as needed. For acid reflux  . famotidine (PEPCID) 40 MG tablet Take 40 mg by mouth 2 (two) times daily.  . furosemide (LASIX) 20 MG tablet Take 1 tablet (20 mg total) by mouth daily as needed for fluid or edema.  . hyoscyamine (LEVSIN SL) 0.125 MG SL tablet PLACE 2 TABLETS (0.25 MG TOTAL) UNDER THE TONGUE EVERY 4 (FOUR) HOURS AS NEEDED (SPASMS).  . isosorbide mononitrate (IMDUR) 30 MG 24 hr tablet Take 15 mg by mouth at bedtime.  Marland Kitchen MYRBETRIQ 50 MG TB24 tablet Take 50 mg by mouth daily.  . nitroGLYCERIN (NITROSTAT) 0.4 MG SL tablet Place 1 tablet (0.4 mg total) under the tongue every 5 (five) minutes as needed (chest pain).  . polyethylene glycol powder (GLYCOLAX/MIRALAX) powder Take 17 g by mouth daily as needed for moderate constipation.  . propranolol (INDERAL) 10 MG tablet TAKE 1 TABLET BY MOUTH 3 TIMES DAILY AS NEEDED (TREMORS).  Marland Kitchen sertraline (ZOLOFT) 25 MG tablet Take 1 tablet (25 mg total) by mouth daily.  Marland Kitchen SYNTHROID 112 MCG tablet TAKE 1 TABLET (112 MCG TOTAL) BY MOUTH  DAILY BEFORE BREAKFAST.   No facility-administered encounter medications on file as of 04/28/2016.  :  Review of Systems:  Out of a complete 14 point review of systems, all are reviewed and negative with the  exception of these symptoms as listed below:   Review of Systems  Neurological:       Patient would like to discuss intense chills and increased all over weakness.  Patient states that "she does not feel well today". Unable to give specifics, says it is an all over feeling.  3 weeks ago, PCP sent patient to ED for weakness and chills. No findings, just stated that is was caused by progression of disease.     Objective:  Neurologic Exam  Physical Exam Physical Examination:   Filed Vitals:   04/28/16 1558  BP: 122/70  Pulse: 72  Resp: 16   General Examination: The patient is a very pleasant 75 y.o. female in no acute distress. She appears well-developed and well-nourished and well groomed. She is in good spirits today, her usual self. She is situated in her wheelchair.  HEENT: Normocephalic, atraumatic, pupils are equal, round and reactive to light and accommodation. Extraocular tracking shows mild limitation to upper gaze. She has saccadic breakdown of smooth pursuit but no nystagmus. Hearing is mildly impaired. She has hearing aids in place. She has mild facial masking and normal facial sensation. Speech shows mild to moderate hypophonia with mild dysarthria and mild to moderate voice tremor. Neck shows moderate rigidity. No spasms noted. There is a mild head, no-no type tremor. There are no carotid bruits on auscultation. Head is tilted to the L and she tends to lean a little bit to the left in her wheelchair, better from last time. Oropharynx exam reveals: mild to moderate mouth dryness, adequate dental hygiene and mild airway crowding, due to narrow airway. Mallampati is class II.   Chest: Clear to auscultation without wheezing, rhonchi or crackles noted.  Heart: S1+S2+0, regular and normal without murmurs, rubs or gallops noted.   Abdomen: Soft, non-tender and non-distended with normal bowel sounds appreciated on auscultation.  Extremities: There is 1+ pitting edema in the distal  lower extremities bilaterally, stable.    Skin: Warm and dry without trophic changes noted. She has some smaller bruises on her hands.   Musculoskeletal: exam reveals no obvious joint deformities, tenderness or joint swelling or erythema.   Neurologically:  Mental status: The patient is awake, alert and oriented in all 4 spheres. Her memory, attention, language and knowledge are fairly well preserved. There is no aphasia, agnosia, apraxia or anomia. Speech is mildly tremulous and slightly dysarthric with normal prosody and enunciation. Thought process is linear. Mood is congruent and affect is normal.  Cranial nerves are as described above under HEENT exam. In addition, shoulder shrug is normal with equal shoulder height noted. Motor exam: she has thin bulk, 4/5 global strength and tone is increased throughout, moderately, R>L. There is no drift, mild intermittent resting tremor in the RUE, no rebound. Grip strength is a little bit worse on the right. She has a mild action tremor bilaterally as well as a mild postural tremor, right worse than left. Romberg is not testable. Reflexes are 2+ in the UEs and 3+ in the LEs, unchanged. Fine motor skills are moderately impaired in the UEs and severely impaired in the LEs, with no significant lateralization, perhaps a little worse on the R, unchanged from before.  Cerebellar testing shows no dysmetria or intention tremor on finger  to nose testing. There is no truncal or gait ataxia.  Sensory exam is intact to light touch in both upper and lower extremities.  Gait, station and balance: I did not try to stand or walk her today, she requires maximum assistance.   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 She has progressed with time, as is expected for this rare, neurodegenerative disease. She had reported generalize muscle spasms for which I suggested she try baclofen and low dose. She never actually tried it. I suggested that she could try it at 10 mg strength half a pill up to 3 times a day as needed. Her husband reports that they may have the bottle at home. She has experienced in the past few months episodes of chills without fever. 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 She does not have much in the way of blood pressure fluctuations but this could be because she is practically wheelchair bound and does not have much mobility. I suggested that she try warm beverages and a blanket as needed to combat her chills. I reordered outpatient physical therapy which she also requested. She had a brain MRI in June 2016 with stable findings compared to September 2013, which was reassuring.  I again talked to the patient and her husband about her atypical parkinsonism and the challenges of this including progression with time and additional symptoms such as muscle spasms experienced and bladder dyscontrol as well as chills more recently. She is on a bladder medication at this time. She had talked to her urologist about her urinary tract infections and potential Botox injections as well.  She has fallen repeatedly in the past but not recently thankfully. She is primarily confined to her wheelchair and sleeps in her lift chair. She has been seeing a GI specialist. She sees a urologist for her bladder spasms and recurrent urinary tract infections. She had a recent B12 injection with her primary care physician. I suggested a six-month checkup, sooner as needed. I  answered all their questions today and the patient and her husband were in agreement. I spent 25 minutes in total face-to-face time with the patient, more than 50% of which was spent in counseling and coordination of care, reviewing test results, reviewing medication and discussing or reviewing the diagnosis of MSA, its prognosis and treatment options.

## 2016-05-07 DIAGNOSIS — N3941 Urge incontinence: Secondary | ICD-10-CM | POA: Diagnosis not present

## 2016-05-26 ENCOUNTER — Ambulatory Visit: Payer: Medicare Other | Attending: Family Medicine | Admitting: Physical Therapy

## 2016-05-26 DIAGNOSIS — R2681 Unsteadiness on feet: Secondary | ICD-10-CM

## 2016-05-26 DIAGNOSIS — R29818 Other symptoms and signs involving the nervous system: Secondary | ICD-10-CM | POA: Diagnosis not present

## 2016-05-26 DIAGNOSIS — R293 Abnormal posture: Secondary | ICD-10-CM | POA: Insufficient documentation

## 2016-05-26 DIAGNOSIS — R2689 Other abnormalities of gait and mobility: Secondary | ICD-10-CM | POA: Insufficient documentation

## 2016-05-26 DIAGNOSIS — M6281 Muscle weakness (generalized): Secondary | ICD-10-CM | POA: Diagnosis not present

## 2016-05-27 ENCOUNTER — Other Ambulatory Visit: Payer: Self-pay | Admitting: Family Medicine

## 2016-05-31 ENCOUNTER — Encounter: Payer: Self-pay | Admitting: Physical Therapy

## 2016-05-31 NOTE — Therapy (Signed)
[facility] [address] [place], [place], [address] Phone: [phone]   Fax:  [phone]  Physical Therapy Evaluation  Patient Details  Name: [name] [name] MRN: [id number] Date of Birth: 05-06-1941 Referring Provider: Dr. Star Age  Encounter Date: 05/26/2016      PT End of Session - 05/31/16 1252    Visit Number 1   Number of Visits 17   Date for PT Re-Evaluation 07/25/16   Authorization Type UHC [facility]   Authorization Time Period 05-26-16 - 07-25-16   PT Start Time 1447   PT Stop Time 1535   PT Time Calculation (min) 48 min      Past Medical History:  Diagnosis Date  . Asthma   . CAD (coronary artery disease)    myoview 5/08:  EF 76%, no scar, no ischemia, +ECG changes with exercise;  cath 04/08/07:  pLAD 30%, mLAD 60-70% - med tx.  . Chronic cystitis    recurrent UTIs, started bactrim ppx (McDiarmid)  . Depression   . Familial tremor    followed by Dr. Erling Cruz  . GERD (gastroesophageal reflux disease)    s/p nissen  . History of pneumonia   . HLD (hyperlipidemia)   . Hydronephrosis, bilateral   . Hypothyroidism   . Internal hemorrhoids   . 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 Neurogenic bladder    Tannenbaum/MacDiarmid  . Osteoporosis 2016   DEXA T-4.4 spine deteriorated since 2012  . Unspecified chronic bronchitis ([place])   . VAGINITIS, ATROPHIC 11/07/2007    Past Surgical History:  Procedure Laterality Date  . COLONOSCOPY N/A 08/29/2013   int hemm; Inda Castle, MD  . CYSTECTOMY    . DEXA  12/2014   T -4.4 spine  . ESOPHAGOGASTRODUODENOSCOPY N/A 01/09/2015   Procedure: ESOPHAGOGASTRODUODENOSCOPY (EGD);  Surgeon: Inda Castle, MD;  Location: [facility];  Service: Endoscopy;  Laterality: N/A;  help with transfers  . LAPAROSCOPIC NISSEN FUNDOPLICATION    . TUBAL LIGATION    . TUMOR REMOVAL      There were no vitals filed for this visit.       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 at [facility] clinic in Aug. 2013;Pt. had UTI on 04-11-15; went to ED - received catherization; states she has had increased weakness and some mild decline in mobility since this problem   Patient Stated Goals improve walking and mobility   Currently in Pain? No/denies            [facility] PT Assessment - 05/31/16 0001      Assessment   Medical Diagnosis Multiple Systems Atrophy   Referring Provider Dr. Star Age   Onset Date/Surgical Date --  2012 with [place] diagnosis May 2014   Prior Therapy pt had PT at this facility from Jan. -April 2017     Precautions   Precautions Fall     Balance Screen   Has the patient fallen in the past 6 months No   Has the patient had a decrease in activity level because of a fear of falling?  No   Is the patient reluctant to leave their home because of a fear of  falling?  No     Home Environment   Living Environment Private residence   Living Arrangements Spouse/significant other   Type of [place] entrance  back entrance   [place] level     Prior Function   Level of Independence Needs assistance with ADLs;Needs assistance with gait;Needs assistance with transfers;Needs assistance with homemaking     Posture/Postural Control   Posture/Postural Control Postural limitations   Postural Limitations Rounded Shoulders;Forward head;Weight shift left     ROM / Strength   AROM / PROM / Strength AROM     AROM   Overall AROM Comments pt has involuntary movements/dyskinesias in bil. LE's     Transfers   Transfers Sit to Stand   Sit to Stand 3: Mod assist   Sit to Stand Details Other (comment)  pt unable to  scoot forward in wheelchair independently   Stand to Sit 3: Mod assist     Ambulation/Gait   Ambulation/Gait Yes   Ambulation/Gait Assistance 3: Mod assist   Ambulation/Gait Assistance Details assistance for keeping RW close and cues for upright posture   Ambulation Distance (Feet) 20 Feet   Assistive device Rolling walker   Gait Pattern Wide base of support;Decreased hip/knee flexion - right;Decreased hip/knee flexion - left;Decreased stride length;Step-through pattern;Abducted- right;Decreased trunk rotation;Decreased step length - right;Decreased step length - left;Festinating   Ambulation Surface Level;Indoor   Gait Comments R foot freezes and supinates due to incr. tone     RLE Tone   RLE Tone Hypertonic                             PT Short Term Goals - 05/31/16 1301      PT SHORT TERM GOAL #1   Title Pt. will increase standing tolerance so she is able to report standing 3" at home with min to [place] for independence with ADL's. (06-25-16)   Status New     PT SHORT TERM GOAL #2   Title Amb. with RW 78' with mod assist.  (06-25-16)   Baseline 20' on 05-26-16   Status New     PT [place] #3   Title Transfer sit to stand from wheelchair with mod assist   (06-25-16)   Status New     PT SHORT TERM GOAL #4   Title Independent in HEP for stretching and strengthening.  (06-25-16)   Time 4   Period Weeks   Status New           PT Long Term Goals - 05/31/16 1304      PT LONG TERM GOAL #1   Title Pt. will ambulate 65'  nonstop with RW to demo incr. endurance with CGA  (07-25-16)   Baseline 20' on 05-26-16   Time 8   Period Weeks   Status New     PT LONG TERM GOAL #2   Title Report ability to stand at least 5" with UE support with min assist at home for improved standing tolerance  (07-25-16)   Time 8   Period Weeks   Status New     PT LONG TERM GOAL #3   Title Perform sit to stand transfer from mat to RW with +1 mod assist. (07-25-16)   Time 8    Period Weeks   Status New     PT LONG TERM GOAL #4   Title Amb. 15' in </= 3"  to demo incr. gait speed.  (07-25-16)   Baseline 15' in 5" 28 secs on 05-26-16   Time 8   Period Weeks   Status New     PT LONG TERM GOAL #5   Title Independent to direct assistance prn with stretches and exercises for HEP.  (07-25-16)   Time 8   Period Weeks   Status New               Plan - 05/31/16 1253    Clinical Impression Statement Pt is a 75 year old lady with MSA with significant tone and dyskinesias in bil. LE's with RLE worse than LLE.  Pt presents with posture abnormalities with pt leaning toward R side and R trunk shortened . Pt is unable to perform transfers or bed mobility independently.  PMH includes thyroid disease, cardiac disease and MSA diagnosed in 2014 by [facility].  Recently she has had severe chills and has had recurrent falls but reports no falls in past year.     Rehab Potential Good   Clinical Impairments Affecting Rehab Potential severity of deficits and progressive neurological disease   PT Frequency 2x / week   PT Duration 8 weeks   PT Treatment/Interventions ADLs/Self Care Home Management;Therapeutic activities;Patient/family education;Therapeutic exercise;Gait training;Balance training;Stair training;Neuromuscular re-education;Functional mobility training   PT Next Visit Plan stretches and ROM for LE's and trunk; gait train as able   PT Home Exercise Plan stretches and supine strengthening exs.   Consulted and Agree with Plan of Care Patient      Patient will benefit from skilled therapeutic intervention in order to improve the following deficits and impairments:  Abnormal gait, Decreased coordination, Impaired flexibility, Difficulty walking, Decreased balance, Increased muscle spasms, Impaired tone, Decreased mobility, Decreased strength, Decreased activity tolerance, Decreased range of motion  Visit Diagnosis: Other abnormalities of gait and mobility - Plan: PT plan  of care cert/re-cert  Other symptoms and signs involving the nervous system - Plan: PT plan of care cert/re-cert  Unsteadiness on feet - Plan: PT plan of care cert/re-cert  Abnormal posture - Plan: PT plan of care cert/re-cert  Muscle weakness (generalized) - Plan: PT plan of care cert/re-cert     Problem List Patient Active Problem List   Diagnosis Date Noted  . General weakness 02/26/2016  . Chills 02/26/2016  . Other fatigue 02/04/2016  . Neurogenic bladder   . Chronic cystitis 04/18/2015  . Pedal edema 04/01/2015  . Dyspepsia 01/09/2015  . Constipation 12/14/2014  . Medicare annual wellness visit, subsequent 11/27/2014  . Advanced care planning/counseling discussion 11/27/2014  . Osteoporosis   . Vitamin B12 deficiency 06/07/2014  . Recurrent falls 02/06/2014  . Multiple system atrophy ([place])   . Dysphagia, unspecified(787.20) 07/28/2013  . Shoulder pain, left 07/20/2012  . 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 Routine health maintenance 08/26/2011  . Murmur 05/27/2011  . HYPERCHOLESTEROLEMIA  IIA 05/14/2010  . VENOUS INSUFFICIENCY, LEGS 06/21/2009  . CORONARY HEART DISEASE 06/09/2008  . Hypothyroidism 05/15/2008  . ASTHMA 05/15/2008  . Depression 11/07/2007  . 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 GERD 11/07/2007  . VAGINITIS, ATROPHIC 11/07/2007    Alda Lea, PT 05/31/2016, 1:17 PM  [facility] [address] [place], [place], [address] Phone: [phone]   Fax:  [phone]  Name: [name] [name] MRN: [id number] Date of Birth: 05-Jul-1941

## 2016-06-04 ENCOUNTER — Ambulatory Visit: Payer: Medicare Other | Admitting: Physical Therapy

## 2016-06-04 DIAGNOSIS — R2689 Other abnormalities of gait and mobility: Secondary | ICD-10-CM | POA: Diagnosis not present

## 2016-06-04 DIAGNOSIS — R2681 Unsteadiness on feet: Secondary | ICD-10-CM | POA: Diagnosis not present

## 2016-06-04 DIAGNOSIS — R29818 Other symptoms and signs involving the nervous system: Secondary | ICD-10-CM | POA: Diagnosis not present

## 2016-06-04 DIAGNOSIS — M6281 Muscle weakness (generalized): Secondary | ICD-10-CM | POA: Diagnosis not present

## 2016-06-04 DIAGNOSIS — R293 Abnormal posture: Secondary | ICD-10-CM | POA: Diagnosis not present

## 2016-06-07 NOTE — Therapy (Signed)
[facility] [address] [place], [place], [address] Phone: [phone]   Fax:  [phone]  Physical Therapy Treatment  Patient Details  Name: [name] [name] MRN: [id number] Date of Birth: February 04, 1941 Referring Provider: Dr. Star Age  Encounter Date: 06/04/2016      PT End of Session - 06/07/16 2132    Visit Number 2   Number of Visits 17   Date for PT Re-Evaluation 07/25/16   Authorization Type UHC [facility]   Authorization Time Period 05-26-16 - 07-25-16   PT Start Time 1447   PT Stop Time 1544   PT Time Calculation (min) 57 min   Equipment Utilized During Treatment Gait belt      Past Medical History:  Diagnosis Date  . Asthma   . CAD (coronary artery disease)    myoview 5/08:  EF 76%, no scar, no ischemia, +ECG changes with exercise;  cath 04/08/07:  pLAD 30%, mLAD 60-70% - med tx.  . Chronic cystitis    recurrent UTIs, started bactrim ppx (McDiarmid)  . Depression   . Familial tremor    followed by Dr. Erling Cruz  . GERD (gastroesophageal reflux disease)    s/p nissen  . History of pneumonia   . HLD (hyperlipidemia)   . Hydronephrosis, bilateral   . Hypothyroidism   . Internal hemorrhoids   . 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 Neurogenic bladder    Tannenbaum/MacDiarmid  . Osteoporosis 2016   DEXA T-4.4 spine deteriorated since 2012  . Unspecified chronic bronchitis ([place])   . VAGINITIS, ATROPHIC 11/07/2007    Past Surgical History:  Procedure Laterality Date  . COLONOSCOPY N/A 08/29/2013   int hemm; Inda Castle, MD  . CYSTECTOMY    . DEXA  12/2014   T -4.4 spine  . ESOPHAGOGASTRODUODENOSCOPY N/A 01/09/2015   Procedure: ESOPHAGOGASTRODUODENOSCOPY (EGD);  Surgeon: Inda Castle, MD;  Location: [facility];  Service: Endoscopy;  Laterality: N/A;  help with transfers  . LAPAROSCOPIC NISSEN FUNDOPLICATION    . TUBAL LIGATION    . TUMOR REMOVAL      There were no vitals  filed for this visit.      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 at [facility] clinic in Aug. 2013;Pt. had UTI on 04-11-15; went to ED - received catherization; states she has had increased weakness and some mild decline in mobility since this problem   Patient Stated Goals improve walking and mobility   Currently in Pain? No/denies                         [facility] Adult PT Treatment/Exercise - 06/07/16 0001      Ambulation/Gait   Ambulation/Gait Yes   Ambulation/Gait Assistance 3: Mod assist   Ambulation/Gait Assistance Details assistance with weight shifting to incr. ease with R swing through   Ambulation Distance (Feet) 40 Feet   Assistive device Rolling walker   Gait Pattern Wide base of support;Decreased hip/knee flexion - right;Decreased hip/knee flexion - left;Decreased stride length;Step-through pattern;Abducted- right;Decreased trunk rotation;Decreased step length - right;Decreased step length - left;Festinating   Ambulation Surface Level;Indoor   Gait Comments R foot freezes and supinates due to incr. tone     Neuro Re-ed  Neuro Re-ed Details  Pt performed seated PWR! reach exercise to floor and up overhead to fasciltate anterior weight shift and trunk extension x 10 reps; also performed seated lateral trun rotation with hands meeting together on either side 10 reps to R and L as able     Lumbar Exercises: Stretches   Lower Trunk Rotation 2 reps;30 seconds  in seated position     Lumbar Exercises: Supine   Bridge 10 reps  2 sets     Knee/Hip Exercises: Aerobic   Stationary Bike Nustep level 2 x 10" with UE's and LE's                  PT Short Term Goals - 05/31/16 1301      PT SHORT TERM GOAL #1   Title Pt. will increase standing tolerance so she is able to  report standing 3" at home with min to [place] for independence with ADL's. (06-25-16)   Status New     PT SHORT TERM GOAL #2   Title Amb. with RW 30' with mod assist.  (06-25-16)   Baseline 20' on 05-26-16   Status New     PT [place] #3   Title Transfer sit to stand from wheelchair with mod assist   (06-25-16)   Status New     PT SHORT TERM GOAL #4   Title Independent in HEP for stretching and strengthening.  (06-25-16)   Time 4   Period Weeks   Status New           PT Long Term Goals - 05/31/16 1304      PT LONG TERM GOAL #1   Title Pt. will ambulate 70'  nonstop with RW to demo incr. endurance with CGA  (07-25-16)   Baseline 20' on 05-26-16   Time 8   Period Weeks   Status New     PT LONG TERM GOAL #2   Title Report ability to stand at least 5" with UE support with min assist at home for improved standing tolerance  (07-25-16)   Time 8   Period Weeks   Status New     PT LONG TERM GOAL #3   Title Perform sit to stand transfer from mat to RW with +1 mod assist. (07-25-16)   Time 8   Period Weeks   Status New     PT LONG TERM GOAL #4   Title Amb. 15' in </= 3" to demo incr. gait speed.  (07-25-16)   Baseline 15' in 5" 28 secs on 05-26-16   Time 8   Period Weeks   Status New     PT LONG TERM GOAL #5   Title Independent to direct assistance prn with stretches and exercises for HEP.  (07-25-16)   Time 8   Period Weeks   Status New               Plan - 06/07/16 2133    Clinical Impression Statement Pt had more difficulty advancing RLE due to incr. freezing episodes - pt needed manual cues/min to mod assist with weight shifting to advance RLE   Rehab Potential Good   Clinical Impairments Affecting Rehab Potential severity of deficits and progressive neurological disease   PT Frequency 2x / week   PT Duration 8 weeks   PT Treatment/Interventions ADLs/Self Care Home Management;Therapeutic activities;Patient/family education;Therapeutic exercise;Gait  training;Balance training;Stair training;Neuromuscular re-education;Functional mobility training   PT Next Visit Plan stretches and ROM for LE's and  trunk; gait train as able   PT Home Exercise Plan stretches and supine strengthening exs.   Consulted and Agree with Plan of Care Patient      Patient will benefit from skilled therapeutic intervention in order to improve the following deficits and impairments:  Abnormal gait, Decreased coordination, Impaired flexibility, Difficulty walking, Decreased balance, Increased muscle spasms, Impaired tone, Decreased mobility, Decreased strength, Decreased activity tolerance, Decreased range of motion  Visit Diagnosis: Other abnormalities of gait and mobility  Other symptoms and signs involving the nervous system     Problem List Patient Active Problem List   Diagnosis Date Noted  . General weakness 02/26/2016  . Chills 02/26/2016  . Other fatigue 02/04/2016  . Neurogenic bladder   . Chronic cystitis 04/18/2015  . Pedal edema 04/01/2015  . Dyspepsia 01/09/2015  . Constipation 12/14/2014  . Medicare annual wellness visit, subsequent 11/27/2014  . Advanced care planning/counseling discussion 11/27/2014  . Osteoporosis   . Vitamin B12 deficiency 06/07/2014  . Recurrent falls 02/06/2014  . Multiple system atrophy ([place])   . Dysphagia, unspecified(787.20) 07/28/2013  . Shoulder pain, left 07/20/2012  . 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 Routine health maintenance 08/26/2011  . Murmur 05/27/2011  . HYPERCHOLESTEROLEMIA  IIA 05/14/2010  . VENOUS INSUFFICIENCY, LEGS 06/21/2009  . CORONARY HEART DISEASE 06/09/2008  . Hypothyroidism 05/15/2008  . ASTHMA 05/15/2008  . Depression 11/07/2007  . 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 GERD 11/07/2007  . VAGINITIS, ATROPHIC 11/07/2007    Alda Lea, PT 06/07/2016, 9:36 PM  [facility] [address] [place], [place],  [address] Phone: [phone]   Fax:  [phone]  Name: [name] [name] MRN: [id number] Date of Birth: [birth year]

## 2016-06-08 ENCOUNTER — Ambulatory Visit: Payer: Medicare Other | Admitting: Physical Therapy

## 2016-06-08 DIAGNOSIS — R29818 Other symptoms and signs involving the nervous system: Secondary | ICD-10-CM

## 2016-06-08 DIAGNOSIS — R2689 Other abnormalities of gait and mobility: Secondary | ICD-10-CM

## 2016-06-08 DIAGNOSIS — M6281 Muscle weakness (generalized): Secondary | ICD-10-CM | POA: Diagnosis not present

## 2016-06-08 DIAGNOSIS — R2681 Unsteadiness on feet: Secondary | ICD-10-CM | POA: Diagnosis not present

## 2016-06-08 DIAGNOSIS — R293 Abnormal posture: Secondary | ICD-10-CM | POA: Diagnosis not present

## 2016-06-09 ENCOUNTER — Encounter: Payer: Self-pay | Admitting: Physical Therapy

## 2016-06-09 NOTE — Therapy (Signed)
[facility] [address] [place], [place], [address] Phone: [phone]   Fax:  [phone]  Physical Therapy Treatment  Patient Details  Name: [name] [name] MRN: [id number] Date of Birth: August 08, 1941 Referring Provider: Dr. Star Age  Encounter Date: 06/08/2016      PT End of Session - 06/09/16 2212    Visit Number 3   Number of Visits 17   Date for PT Re-Evaluation 07/25/16   Authorization Type U[employer] [facility]   Authorization Time Period 05-26-16 - 07-25-16   PT Start Time 1446   PT Stop Time 1534   PT Time Calculation (min) 48 min   Equipment Utilized During Treatment Gait belt      Past Medical History:  Diagnosis Date  . Asthma   . CAD (coronary artery disease)    myoview 5/08:  EF 76%, no scar, no ischemia, +ECG changes with exercise;  cath 04/08/07:  pLAD 30%, mLAD 60-70% - med tx.  . Chronic cystitis    recurrent UTIs, started bactrim ppx (McDiarmid)  . Depression   . Familial tremor    followed by Dr. Erling Cruz  . GERD (gastroesophageal reflux disease)    s/p nissen  . History of pneumonia   . HLD (hyperlipidemia)   . Hydronephrosis, bilateral   . Hypothyroidism   . Internal hemorrhoids   . 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 Neurogenic bladder    Tannenbaum/MacDiarmid  . Osteoporosis 2016   DEXA T-4.4 spine deteriorated since 2012  . Unspecified chronic bronchitis ([place])   . VAGINITIS, ATROPHIC 11/07/2007    Past Surgical History:  Procedure Laterality Date  . COLONOSCOPY N/A 08/29/2013   int hemm; Inda Castle, MD  . CYSTECTOMY    . DEXA  12/2014   T -4.4 spine  . ESOPHAGOGASTRODUODENOSCOPY N/A 01/09/2015   Procedure: ESOPHAGOGASTRODUODENOSCOPY (EGD);  Surgeon: Inda Castle, MD;  Location: [facility];  Service: Endoscopy;  Laterality: N/A;  help with transfers  . LAPAROSCOPIC NISSEN FUNDOPLICATION    . TUBAL LIGATION    . TUMOR REMOVAL      There were no vitals  filed for this visit.      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 at [facility] clinic in Aug. 2013;Pt. had UTI on 04-11-15; went to ED - received catherization; states she has had increased weakness and some mild decline in mobility since this problem   Patient Stated Goals improve walking and mobility   Currently in Pain? No/denies                         [facility] Adult PT Treatment/Exercise - 06/09/16 0001      Transfers   Transfers Sit to Stand   Sit to Stand 3: Mod assist   Sit to Stand Details Verbal cues for technique;Manual facilitation for placement   Stand to Sit 3: Mod assist   Comments used swivel transfer disc to educate pt in available DME to assist with transfers     Ambulation/Gait   Ambulation/Gait Yes   Ambulation/Gait Assistance 3: Mod assist   Ambulation/Gait Assistance Details assistance with weight shifting, especially with freezing episodes of R foot   Ambulation Distance (Feet) 80 Feet   Assistive device Rolling walker  Gait Pattern Wide base of support;Decreased hip/knee flexion - right;Decreased hip/knee flexion - left;Decreased stride length;Step-through pattern;Abducted- right;Decreased trunk rotation;Decreased step length - right;Decreased step length - left;Festinating   Ambulation Surface Level;Indoor   Gait Comments R foot freezes and supinates due to incr. tone     Neuro Re-ed    Neuro Re-ed Details  Pt performed seated PWR! reach exercise to floor and up overhead to fasciltate anterior weight shift and trunk extension x 10 reps; also performed seated lateral trun rotation with hands meeting together on either side 10 reps to R and L as able     Knee/Hip Exercises: Aerobic   Stationary Bike Nustep level 2 x 15" with UE's and LE's  no charge as unsupervised     TherEx: seated trunk  stretches - lateral leaning on forearm - reaching overhead - 15 sec hold - R and L sides 2 times each  Trunk rotation stretch - 15 sec hold R and L sides x 2 reps each Seated marching and knee extension alternating for coordination of LE's - 10 reps each           PT Short Term Goals - 05/31/16 1301      PT SHORT TERM GOAL #1   Title Pt. will increase standing tolerance so she is able to report standing 3" at home with min to [place] for independence with ADL's. (06-25-16)   Status New     PT SHORT TERM GOAL #2   Title Amb. with RW 16' with mod assist.  (06-25-16)   Baseline 20' on 05-26-16   Status New     PT [place] #3   Title Transfer sit to stand from wheelchair with mod assist   (06-25-16)   Status New     PT SHORT TERM GOAL #4   Title Independent in HEP for stretching and strengthening.  (06-25-16)   Time 4   Period Weeks   Status New           PT Long Term Goals - 05/31/16 1304      PT LONG TERM GOAL #1   Title Pt. will ambulate 61'  nonstop with RW to demo incr. endurance with CGA  (07-25-16)   Baseline 20' on 05-26-16   Time 8   Period Weeks   Status New     PT LONG TERM GOAL #2   Title Report ability to stand at least 5" with UE support with min assist at home for improved standing tolerance  (07-25-16)   Time 8   Period Weeks   Status New     PT LONG TERM GOAL #3   Title Perform sit to stand transfer from mat to RW with +1 mod assist. (07-25-16)   Time 8   Period Weeks   Status New     PT LONG TERM GOAL #4   Title Amb. 15' in </= 3" to demo incr. gait speed.  (07-25-16)   Baseline 15' in 5" 28 secs on 05-26-16   Time 8   Period Weeks   Status New     PT LONG TERM GOAL #5   Title Independent to direct assistance prn with stretches and exercises for HEP.  (07-25-16)   Time 8   Period Weeks   Status New               Plan - 06/09/16 2212    Clinical Impression Statement Pt requires mod to max assist with weight shifting and  cues for  counting to assist with RLE freezing episodes during gait:  pt increased amb. distance from 67' to 23' in today's session   Rehab Potential Good   Clinical Impairments Affecting Rehab Potential severity of deficits and progressive neurological disease   PT Frequency 2x / week   PT Duration 8 weeks   PT Treatment/Interventions ADLs/Self Care Home Management;Therapeutic activities;Patient/family education;Therapeutic exercise;Gait training;Balance training;Stair training;Neuromuscular re-education;Functional mobility training   PT Next Visit Plan  stretches, seated PWR exercises: gait train with RW as able   PT Home Exercise Plan stretches and supine strengthening exs.   Consulted and Agree with Plan of Care Patient      Patient will benefit from skilled therapeutic intervention in order to improve the following deficits and impairments:  Abnormal gait, Decreased coordination, Impaired flexibility, Difficulty walking, Decreased balance, Increased muscle spasms, Impaired tone, Decreased mobility, Decreased strength, Decreased activity tolerance, Decreased range of motion  Visit Diagnosis: Other abnormalities of gait and mobility  Other symptoms and signs involving the nervous system     Problem List Patient Active Problem List   Diagnosis Date Noted  . General weakness 02/26/2016  . Chills 02/26/2016  . Other fatigue 02/04/2016  . Neurogenic bladder   . Chronic cystitis 04/18/2015  . Pedal edema 04/01/2015  . Dyspepsia 01/09/2015  . Constipation 12/14/2014  . Medicare annual wellness visit, subsequent 11/27/2014  . Advanced care planning/counseling discussion 11/27/2014  . Osteoporosis   . Vitamin B12 deficiency 06/07/2014  . Recurrent falls 02/06/2014  . 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 Dysphagia, unspecified(787.20) 07/28/2013  . Shoulder pain, left 07/20/2012  . 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 Routine health maintenance 08/26/2011  . Murmur 05/27/2011  .  HYPERCHOLESTEROLEMIA  IIA 05/14/2010  . VENOUS INSUFFICIENCY, LEGS 06/21/2009  . CORONARY HEART DISEASE 06/09/2008  . Hypothyroidism 05/15/2008  . ASTHMA 05/15/2008  . Depression 11/07/2007  . 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 GERD 11/07/2007  . VAGINITIS, ATROPHIC 11/07/2007    Alda Lea, PT 06/09/2016, 10:17 PM  [facility] [address] [place], [place], [address] Phone: [phone]   Fax:  [phone]  Name: [name] [name] MRN: [id number] Date of Birth: [birth year]

## 2016-06-23 ENCOUNTER — Ambulatory Visit: Payer: Medicare Other | Attending: Family Medicine | Admitting: Physical Therapy

## 2016-06-23 DIAGNOSIS — R2681 Unsteadiness on feet: Secondary | ICD-10-CM | POA: Diagnosis not present

## 2016-06-23 DIAGNOSIS — R29818 Other symptoms and signs involving the nervous system: Secondary | ICD-10-CM | POA: Diagnosis not present

## 2016-06-23 DIAGNOSIS — R293 Abnormal posture: Secondary | ICD-10-CM | POA: Insufficient documentation

## 2016-06-23 DIAGNOSIS — R2689 Other abnormalities of gait and mobility: Secondary | ICD-10-CM

## 2016-06-25 ENCOUNTER — Ambulatory Visit: Payer: Medicare Other | Admitting: Physical Therapy

## 2016-06-25 DIAGNOSIS — R2681 Unsteadiness on feet: Secondary | ICD-10-CM

## 2016-06-25 DIAGNOSIS — R2689 Other abnormalities of gait and mobility: Secondary | ICD-10-CM | POA: Diagnosis not present

## 2016-06-25 DIAGNOSIS — R293 Abnormal posture: Secondary | ICD-10-CM | POA: Diagnosis not present

## 2016-06-25 DIAGNOSIS — R29818 Other symptoms and signs involving the nervous system: Secondary | ICD-10-CM | POA: Diagnosis not present

## 2016-06-25 NOTE — Therapy (Signed)
[facility] [address] [place], [place], [address] Phone: [phone]   Fax:  [phone]  Physical Therapy Treatment  Patient Details  Name: [name] [name] MRN: [id number] Date of Birth: Sep 27, 1941 Referring Provider: Dr. Star Age  Encounter Date: 06/23/2016      PT End of Session - 06/25/16 0833    Visit Number 4   Number of Visits 17   Date for PT Re-Evaluation 07/25/16   Authorization Type [facility]   Authorization Time Period 05-26-16 - 07-25-16   PT Start Time 1447   PT Stop Time 1535   PT Time Calculation (min) 48 min   Equipment Utilized During Treatment Gait belt      Past Medical History:  Diagnosis Date  . Asthma   . CAD (coronary artery disease)    myoview 5/08:  EF 76%, no scar, no ischemia, +ECG changes with exercise;  cath 04/08/07:  pLAD 30%, mLAD 60-70% - med tx.  . Chronic cystitis    recurrent UTIs, started bactrim ppx (McDiarmid)  . Depression   . Familial tremor    followed by Dr. Erling Cruz  . GERD (gastroesophageal reflux disease)    s/p nissen  . History of pneumonia   . HLD (hyperlipidemia)   . Hydronephrosis, bilateral   . Hypothyroidism   . Internal hemorrhoids   . 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 Neurogenic bladder    Tannenbaum/MacDiarmid  . Osteoporosis 2016   DEXA T-4.4 spine deteriorated since 2012  . Unspecified chronic bronchitis ([place])   . VAGINITIS, ATROPHIC 11/07/2007    Past Surgical History:  Procedure Laterality Date  . COLONOSCOPY N/A 08/29/2013   int hemm; Inda Castle, MD  . CYSTECTOMY    . DEXA  12/2014   T -4.4 spine  . ESOPHAGOGASTRODUODENOSCOPY N/A 01/09/2015   Procedure: ESOPHAGOGASTRODUODENOSCOPY (EGD);  Surgeon: Inda Castle, MD;  Location: [facility];  Service: Endoscopy;  Laterality: N/A;  help with transfers  . LAPAROSCOPIC NISSEN FUNDOPLICATION    . TUBAL LIGATION    . TUMOR REMOVAL      There were no vitals  filed for this visit.      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 at [facility] clinic in Aug. 2013;Pt. had UTI on 04-11-15; went to ED - received catherization; states she has had increased weakness and some mild decline in mobility since this problem   Patient Stated Goals improve walking and mobility   Currently in Pain? No/denies                         [facility] Adult PT Treatment/Exercise - 06/25/16 0001      Ambulation/Gait   Ambulation/Gait Yes   Ambulation/Gait Assistance 3: Mod assist   Ambulation/Gait Assistance Details assistance with weight shifting with verbal cues to count to assist with R step initiation   Ambulation Distance (Feet) 58 Feet  35' 2nd rep;  25' 3rd rep   Assistive device Rolling walker   Gait Pattern Wide base of support;Decreased hip/knee flexion - right;Decreased hip/knee flexion - left;Decreased stride length;Step-through pattern;Abducted- right;Decreased trunk rotation;Decreased step length - right;Decreased step length - left;Festinating   Ambulation Surface Level;Indoor   Gait Comments R foot  freezes and supinates due to incr. tone           PWR [facility]) - 06/25/16 0830    PWR! Up 10 reps   PWR! Twist 10 reps     Seated trunk rotation stretch - R and L sides - 20 sec hold - with both hands on mat  Leaning on forearm - reaching overhead with opposite arm - 15 sec hold - 2 reps each side  Seated LAQ - 10 reps each - alternating for incr. Coordination of each LE        PT Short Term Goals - 05/31/16 1301      PT SHORT TERM GOAL #1   Title Pt. will increase standing tolerance so she is able to report standing 3" at home with min to [place] for independence with ADL's. (06-25-16)   Status New     PT SHORT TERM GOAL #2   Title Amb.  with RW 40' with mod assist.  (06-25-16)   Baseline 20' on 05-26-16   Status New     PT SHORT TERM GOAL #3   Title Transfer sit to stand from wheelchair with mod assist   (06-25-16)   Status New     PT SHORT TERM GOAL #4   Title Independent in HEP for stretching and strengthening.  (06-25-16)   Time 4   Period Weeks   Status New           PT Long Term Goals - 05/31/16 1304      PT LONG TERM GOAL #1   Title Pt. will ambulate 60'  nonstop with RW to demo incr. endurance with CGA  (07-25-16)   Baseline 20' on 05-26-16   Time 8   Period Weeks   Status New     PT LONG TERM GOAL #2   Title Report ability to stand at least 5" with UE support with min assist at home for improved standing tolerance  (07-25-16)   Time 8   Period Weeks   Status New     PT LONG TERM GOAL #3   Title Perform sit to stand transfer from mat to RW with +1 mod assist. (07-25-16)   Time 8   Period Weeks   Status New     PT LONG TERM GOAL #4   Title Amb. 15' in </= 3" to demo incr. gait speed.  (07-25-16)   Baseline 15' in 5" 28 secs on 05-26-16   Time 8   Period Weeks   Status New     PT LONG TERM GOAL #5   Title Independent to direct assistance prn with stretches and exercises for HEP.  (07-25-16)   Time 8   Period Weeks   Status New               Plan - 06/25/16 7062    Clinical Impression Statement Pt had more difficulty ambulating today - R foot freezing more frequently with pt having more difficulty with R step initiation when freezing does occur   Rehab Potential Good   PT Frequency 2x / week   PT Duration 8 weeks   PT Treatment/Interventions ADLs/Self Care Home Management;Therapeutic activities;Patient/family education;Therapeutic exercise;Gait training;Balance training;Stair training;Neuromuscular re-education;Functional mobility training   PT Next Visit Plan  stretches, seated PWR exercises: gait train with RW as able   PT Home Exercise Plan stretches and supine strengthening exs.    Consulted and Agree with Plan of Care Patient      Patient will  benefit from skilled therapeutic intervention in order to improve the following deficits and impairments:  Abnormal gait, Decreased coordination, Impaired flexibility, Difficulty walking, Decreased balance, Increased muscle spasms, Impaired tone, Decreased mobility, Decreased strength, Decreased activity tolerance, Decreased range of motion  Visit Diagnosis: Other abnormalities of gait and mobility  Unsteadiness on feet     Problem List Patient Active Problem List   Diagnosis Date Noted  . General weakness 02/26/2016  . Chills 02/26/2016  . Other fatigue 02/04/2016  . Neurogenic bladder   . Chronic cystitis 04/18/2015  . Pedal edema 04/01/2015  . Dyspepsia 01/09/2015  . Constipation 12/14/2014  . Medicare annual wellness visit, subsequent 11/27/2014  . Advanced care planning/counseling discussion 11/27/2014  . Osteoporosis   . Vitamin B12 deficiency 06/07/2014  . Recurrent falls 02/06/2014  . Multiple system atrophy ([place])   . Dysphagia, unspecified(787.20) 07/28/2013  . Shoulder pain, left 07/20/2012  . 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 Routine health maintenance 08/26/2011  . Murmur 05/27/2011  . HYPERCHOLESTEROLEMIA  IIA 05/14/2010  . VENOUS INSUFFICIENCY, LEGS 06/21/2009  . CORONARY HEART DISEASE 06/09/2008  . Hypothyroidism 05/15/2008  . ASTHMA 05/15/2008  . Depression 11/07/2007  . 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 GERD 11/07/2007  . VAGINITIS, ATROPHIC 11/07/2007    Alda Lea, PT 06/25/2016, 8:39 AM  [facility] [address] [place], [place], [address] Phone: [phone]   Fax:  [phone]  Name: CHERRELL MAYBEE MRN: [id number] Date of Birth: September 11, 1941

## 2016-06-28 NOTE — Therapy (Signed)
[facility] [address] [place], [place], [address] Phone: [phone]   Fax:  [phone]  Physical Therapy Treatment  Patient Details  Name: [name] [name] MRN: [id number] Date of Birth: 22-Nov-1940 Referring Provider: Dr. Star Age  Encounter Date: 06/25/2016      PT End of Session - 06/28/16 4503    Equipment Utilized During Treatment Gait belt      Past Medical History:  Diagnosis Date  . Asthma   . CAD (coronary artery disease)    myoview 5/08:  EF 76%, no scar, no ischemia, +ECG changes with exercise;  cath 04/08/07:  pLAD 30%, mLAD 60-70% - med tx.  . Chronic cystitis    recurrent UTIs, started bactrim ppx (McDiarmid)  . Depression   . Familial tremor    followed by Dr. Erling Cruz  . GERD (gastroesophageal reflux disease)    s/p nissen  . History of pneumonia   . HLD (hyperlipidemia)   . Hydronephrosis, bilateral   . Hypothyroidism   . Internal hemorrhoids   . 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 Neurogenic bladder    Tannenbaum/MacDiarmid  . Osteoporosis 2016   DEXA T-4.4 spine deteriorated since 2012  . Unspecified chronic bronchitis ([place])   . VAGINITIS, ATROPHIC 11/07/2007    Past Surgical History:  Procedure Laterality Date  . COLONOSCOPY N/A 08/29/2013   int hemm; Inda Castle, MD  . CYSTECTOMY    . DEXA  12/2014   T -4.4 spine  . ESOPHAGOGASTRODUODENOSCOPY N/A 01/09/2015   Procedure: ESOPHAGOGASTRODUODENOSCOPY (EGD);  Surgeon: Inda Castle, MD;  Location: [facility];  Service: Endoscopy;  Laterality: N/A;  help with transfers  . LAPAROSCOPIC NISSEN FUNDOPLICATION    . TUBAL LIGATION    . TUMOR REMOVAL      There were no vitals filed for this visit.      Subjective Assessment - 06/28/16 0820    Subjective Pt reports she does not feel that well today   Patient is accompained by: Family member   Pertinent History Multiple Systems atrophy diagnosed in May  2014 with initial appt. at [facility] in Aug. 2013;Pt. had UTI on 04-11-15; went to ED - received catherization; states she has had increased weakness and some mild decline in mobility since this problem   Patient Stated Goals improve walking and mobility   Currently in Pain? No/denies                         [facility] Adult PT Treatment/Exercise - 06/28/16 0001      Ambulation/Gait   Ambulation/Gait Yes   Ambulation/Gait Assistance 3: Mod assist   Ambulation Distance (Feet) 50 Feet  30' 2nd rep   Assistive device Rolling walker   Gait Pattern Wide base of support;Decreased hip/knee flexion - right;Decreased hip/knee flexion - left;Decreased stride length;Step-through pattern;Abducted- right;Decreased trunk rotation;Decreased step length - right;Decreased step length - left;Festinating   Ambulation Surface Level;Indoor   Gait Comments R foot freezes and supinates due to incr. tone      Sidestepping and backwards amb. Inside // bars - 3 reps each with mod assist  Standing weight shifting with RLE/LLE forward in stance position            PT Short Term Goals - 05/31/16 1301      PT SHORT TERM GOAL #1   Title Pt. will increase standing tolerance so she is able to report  standing 3" at home with min to [place] for independence with ADL's. (06-25-16)   Status New     PT SHORT TERM GOAL #2   Title Amb. with RW 60' with mod assist.  (06-25-16)   Baseline 20' on 05-26-16   Status New     PT SHORT TERM GOAL #3   Title Transfer sit to stand from wheelchair with mod assist   (06-25-16)   Status New     PT SHORT TERM GOAL #4   Title Independent in HEP for stretching and strengthening.  (06-25-16)   Time 4   Period Weeks   Status New           PT Long Term Goals - 05/31/16 1304      PT LONG TERM GOAL #1   Title Pt. will ambulate 57'  nonstop with RW to demo incr. endurance with CGA  (07-25-16)   Baseline 20' on 05-26-16   Time 8   Period Weeks   Status New     PT  LONG TERM GOAL #2   Title Report ability to stand at least 5" with UE support with min assist at home for improved standing tolerance  (07-25-16)   Time 8   Period Weeks   Status New     PT LONG TERM GOAL #3   Title Perform sit to stand transfer from mat to RW with +1 mod assist. (07-25-16)   Time 8   Period Weeks   Status New     PT LONG TERM GOAL #4   Title Amb. 15' in </= 3" to demo incr. gait speed.  (07-25-16)   Baseline 15' in 5" 28 secs on 05-26-16   Time 8   Period Weeks   Status New     PT LONG TERM GOAL #5   Title Independent to direct assistance prn with stretches and exercises for HEP.  (07-25-16)   Time 8   Period Weeks   Status New               Plan - 06/28/16 0824    Clinical Impression Statement Gait activities performed inside bars resulted in some fatigue with patient unable to amb. increased distance as she has been able to do in previous sessions; R foot freezing frequently during gait   Rehab Potential Good   Clinical Impairments Affecting Rehab Potential severity of deficits and progressive neurological disease   PT Frequency 2x / week   PT Duration 8 weeks   PT Next Visit Plan  stretches, seated PWR exercises: gait train with RW as able   PT Home Exercise Plan stretches and supine strengthening exs.   Consulted and Agree with Plan of Care Patient      Patient will benefit from skilled therapeutic intervention in order to improve the following deficits and impairments:  Abnormal gait, Decreased coordination, Impaired flexibility, Difficulty walking, Decreased balance, Increased muscle spasms, Impaired tone, Decreased mobility, Decreased strength, Decreased activity tolerance, Decreased range of motion  Visit Diagnosis: Other abnormalities of gait and mobility  Unsteadiness on feet     Problem List Patient Active Problem List   Diagnosis Date Noted  . General weakness 02/26/2016  . Chills 02/26/2016  . Other fatigue 02/04/2016  .  Neurogenic bladder   . Chronic cystitis 04/18/2015  . Pedal edema 04/01/2015  . Dyspepsia 01/09/2015  . Constipation 12/14/2014  . Medicare annual wellness visit, subsequent 11/27/2014  . Advanced care planning/counseling discussion 11/27/2014  . Osteoporosis   .  Vitamin B12 deficiency 06/07/2014  . Recurrent falls 02/06/2014  . Multiple system atrophy ([place])   . Dysphagia, unspecified(787.20) 07/28/2013  . Shoulder pain, left 07/20/2012  . 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 Routine health maintenance 08/26/2011  . Murmur 05/27/2011  . HYPERCHOLESTEROLEMIA  IIA 05/14/2010  . VENOUS INSUFFICIENCY, LEGS 06/21/2009  . CORONARY HEART DISEASE 06/09/2008  . Hypothyroidism 05/15/2008  . ASTHMA 05/15/2008  . Depression 11/07/2007  . 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 GERD 11/07/2007  . VAGINITIS, ATROPHIC 11/07/2007    Alda Lea, PT 06/28/2016, 8:30 AM  [facility] [address] [place], [place], [address] Phone: [phone]   Fax:  [phone]  Name: [name] [name] MRN: [id number] Date of Birth: 1940-11-27

## 2016-06-30 ENCOUNTER — Ambulatory Visit: Payer: Medicare Other | Admitting: Physical Therapy

## 2016-06-30 DIAGNOSIS — R2681 Unsteadiness on feet: Secondary | ICD-10-CM

## 2016-06-30 DIAGNOSIS — R29818 Other symptoms and signs involving the nervous system: Secondary | ICD-10-CM | POA: Diagnosis not present

## 2016-06-30 DIAGNOSIS — R293 Abnormal posture: Secondary | ICD-10-CM | POA: Diagnosis not present

## 2016-06-30 DIAGNOSIS — R2689 Other abnormalities of gait and mobility: Secondary | ICD-10-CM | POA: Diagnosis not present

## 2016-07-01 ENCOUNTER — Encounter: Payer: Self-pay | Admitting: Physical Therapy

## 2016-07-01 NOTE — Therapy (Signed)
[facility] [address] [place], [place], [address] Phone: [phone]   Fax:  [phone]  Physical Therapy Treatment  Patient Details  Name: [name] [name] MRN: [id number] Date of Birth: Oct 06, 1941 Referring Provider: Dr. Star Age  Encounter Date: 06/30/2016      PT End of Session - 07/01/16 1702    Visit Number 6   Number of Visits 17   Date for PT Re-Evaluation 07/25/16   Authorization Type [facility]   Authorization Time Period 05-26-16 - 07-25-16   PT Start Time 1445   PT Stop Time 1535   PT Time Calculation (min) 50 min   Equipment Utilized During Treatment Gait belt      Past Medical History:  Diagnosis Date  . Asthma   . CAD (coronary artery disease)    myoview 5/08:  EF 76%, no scar, no ischemia, +ECG changes with exercise;  cath 04/08/07:  pLAD 30%, mLAD 60-70% - med tx.  . Chronic cystitis    recurrent UTIs, started bactrim ppx (McDiarmid)  . Depression   . Familial tremor    followed by Dr. Erling Cruz  . GERD (gastroesophageal reflux disease)    s/p nissen  . History of pneumonia   . HLD (hyperlipidemia)   . Hydronephrosis, bilateral   . Hypothyroidism   . Internal hemorrhoids   . 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 Neurogenic bladder    Tannenbaum/MacDiarmid  . Osteoporosis 2016   DEXA T-4.4 spine deteriorated since 2012  . Unspecified chronic bronchitis ([place])   . VAGINITIS, ATROPHIC 11/07/2007    Past Surgical History:  Procedure Laterality Date  . COLONOSCOPY N/A 08/29/2013   int hemm; Inda Castle, MD  . CYSTECTOMY    . DEXA  12/2014   T -4.4 spine  . ESOPHAGOGASTRODUODENOSCOPY N/A 01/09/2015   Procedure: ESOPHAGOGASTRODUODENOSCOPY (EGD);  Surgeon: Inda Castle, MD;  Location: [facility];  Service: Endoscopy;  Laterality: N/A;  help with transfers  . LAPAROSCOPIC NISSEN FUNDOPLICATION    . TUBAL LIGATION    . TUMOR REMOVAL      There were no vitals  filed for this visit.      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 at [facility] clinic in Aug. 2013;Pt. had UTI on 04-11-15; went to ED - received catherization; states she has had increased weakness and some mild decline in mobility since this problem   Patient Stated Goals improve walking and mobility   Currently in Pain? No/denies                         [facility] Adult PT Treatment/Exercise - 07/01/16 0001      Ambulation/Gait   Ambulation/Gait Yes   Ambulation/Gait Assistance 3: Mod assist   Ambulation Distance (Feet) 40 Feet  35', 40' 2nd and 3rd reps with RW   Assistive device Rolling walker   Gait Pattern Wide base of support;Decreased hip/knee flexion - right;Decreased hip/knee flexion - left;Decreased stride length;Step-through pattern;Abducted- right;Decreased trunk rotation;Decreased step length - right;Decreased step length - left;Festinating   Ambulation Surface Level;Indoor   Gait Comments R foot freezes and supinates due to incr. tone     Neuro Re-ed;  Balance activities inside bars - forward amb. 10' , then backward amb. 10' - 2 reps with mod assist Sidestepping inside //bars - with UE support with mod assist - pt has more difficulty adducting RLE than abducting RLE  In sideways ambulation Standing with bil. UE support - cues to stand upright and increase trunk/hip extension as pt leans forward and flexes trunk             PT Short Term Goals - 05/31/16 1301      PT SHORT TERM GOAL #1   Title Pt. will increase standing tolerance so she is able to report standing 3" at home with min to [place] for independence with ADL's. (06-25-16)   Status New     PT SHORT TERM GOAL #2   Title Amb. with RW 43' with mod assist.  (06-25-16)    Baseline 20' on 05-26-16   Status New     PT SHORT TERM GOAL #3   Title Transfer sit to stand from wheelchair with mod assist   (06-25-16)   Status New     PT SHORT TERM GOAL #4   Title Independent in HEP for stretching and strengthening.  (06-25-16)   Time 4   Period Weeks   Status New           PT Long Term Goals - 05/31/16 1304      PT LONG TERM GOAL #1   Title Pt. will ambulate 41'  nonstop with RW to demo incr. endurance with CGA  (07-25-16)   Baseline 20' on 05-26-16   Time 8   Period Weeks   Status New     PT LONG TERM GOAL #2   Title Report ability to stand at least 5" with UE support with min assist at home for improved standing tolerance  (07-25-16)   Time 8   Period Weeks   Status New     PT LONG TERM GOAL #3   Title Perform sit to stand transfer from mat to RW with +1 mod assist. (07-25-16)   Time 8   Period Weeks   Status New     PT LONG TERM GOAL #4   Title Amb. 15' in </= 3" to demo incr. gait speed.  (07-25-16)   Baseline 15' in 5" 28 secs on 05-26-16   Time 8   Period Weeks   Status New     PT LONG TERM GOAL #5   Title Independent to direct assistance prn with stretches and exercises for HEP.  (07-25-16)   Time 8   Period Weeks   Status New               Plan - 07/01/16 1703    Clinical Impression Statement R foot cont. to freeze frequently during gait - pt needs mod to max assist to keep RW close to prevent it getting too far in front, resulting in pt leaning forward and not standing erect    Rehab Potential Good   Clinical Impairments Affecting Rehab Potential severity of deficits and progressive neurological disease   PT Frequency 2x / week   PT Duration 8 weeks   PT Treatment/Interventions ADLs/Self Care Home Management;Therapeutic activities;Patient/family education;Therapeutic exercise;Gait training;Balance training;Stair training;Neuromuscular re-education;Functional mobility training   PT Next Visit Plan  stretches, seated PWR  exercises: gait train with RW as able   PT Home Exercise Plan stretches and supine strengthening exs.   Consulted and Agree with Plan of Care Patient  Patient will benefit from skilled therapeutic intervention in order to improve the following deficits and impairments:  Abnormal gait, Decreased coordination, Impaired flexibility, Difficulty walking, Decreased balance, Increased muscle spasms, Impaired tone, Decreased mobility, Decreased strength, Decreased activity tolerance, Decreased range of motion  Visit Diagnosis: Other abnormalities of gait and mobility  Abnormal posture  Unsteadiness on feet     Problem List Patient Active Problem List   Diagnosis Date Noted  . General weakness 02/26/2016  . Chills 02/26/2016  . Other fatigue 02/04/2016  . Neurogenic bladder   . Chronic cystitis 04/18/2015  . Pedal edema 04/01/2015  . Dyspepsia 01/09/2015  . Constipation 12/14/2014  . Medicare annual wellness visit, subsequent 11/27/2014  . Advanced care planning/counseling discussion 11/27/2014  . Osteoporosis   . Vitamin B12 deficiency 06/07/2014  . Recurrent falls 02/06/2014  . Multiple system atrophy ([place])   . Dysphagia, unspecified(787.20) 07/28/2013  . Shoulder pain, left 07/20/2012  . 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 Routine health maintenance 08/26/2011  . Murmur 05/27/2011  . HYPERCHOLESTEROLEMIA  IIA 05/14/2010  . VENOUS INSUFFICIENCY, LEGS 06/21/2009  . CORONARY HEART DISEASE 06/09/2008  . Hypothyroidism 05/15/2008  . ASTHMA 05/15/2008  . Depression 11/07/2007  . 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 GERD 11/07/2007  . VAGINITIS, ATROPHIC 11/07/2007    Alda Lea, PT 07/01/2016, 5:06 PM  [facility] [address] [place], [place], [address] Phone: [phone]   Fax:  [phone]  Name: NYOMI HOWSER MRN: [id number] Date of Birth: 10/14/1941

## 2016-07-02 ENCOUNTER — Ambulatory Visit: Payer: Medicare Other | Admitting: Physical Therapy

## 2016-07-02 DIAGNOSIS — R2681 Unsteadiness on feet: Secondary | ICD-10-CM | POA: Diagnosis not present

## 2016-07-02 DIAGNOSIS — R2689 Other abnormalities of gait and mobility: Secondary | ICD-10-CM

## 2016-07-02 DIAGNOSIS — L309 Dermatitis, unspecified: Secondary | ICD-10-CM | POA: Diagnosis not present

## 2016-07-02 DIAGNOSIS — R293 Abnormal posture: Secondary | ICD-10-CM | POA: Diagnosis not present

## 2016-07-02 DIAGNOSIS — R29818 Other symptoms and signs involving the nervous system: Secondary | ICD-10-CM | POA: Diagnosis not present

## 2016-07-03 NOTE — Therapy (Signed)
[facility] [address] [place], [place], [address] Phone: [phone]   Fax:  [phone]  Physical Therapy Treatment  Patient Details  Name: [name] [name] MRN: [id number] Date of Birth: 1941-10-30 Referring Provider: Dr. Star Age  Encounter Date: 07/02/2016      PT End of Session - 07/03/16 1505    Visit Number 7   Number of Visits 17   Date for PT Re-Evaluation 07/25/16   Authorization Type [employer] [facility]   Authorization Time Period 05-26-16 - 07-25-16   PT Start Time 1445   PT Stop Time 1530   PT Time Calculation (min) 45 min      Past Medical History:  Diagnosis Date  . Asthma   . CAD (coronary artery disease)    myoview 5/08:  EF 76%, no scar, no ischemia, +ECG changes with exercise;  cath 04/08/07:  pLAD 30%, mLAD 60-70% - med tx.  . Chronic cystitis    recurrent UTIs, started bactrim ppx (McDiarmid)  . Depression   . Familial tremor    followed by Dr. Erling Cruz  . GERD (gastroesophageal reflux disease)    s/p nissen  . History of pneumonia   . HLD (hyperlipidemia)   . Hydronephrosis, bilateral   . Hypothyroidism   . Internal hemorrhoids   . Multiple system atrophy [facility])    [name]  . Neurogenic bladder    Tannenbaum/MacDiarmid  . Osteoporosis 2016   DEXA T-4.4 spine deteriorated since 2012  . Unspecified chronic bronchitis ([place])   . VAGINITIS, ATROPHIC 11/07/2007    Past Surgical History:  Procedure Laterality Date  . COLONOSCOPY N/A 08/29/2013   int hemm; Inda Castle, MD  . CYSTECTOMY    . DEXA  12/2014   T -4.4 spine  . ESOPHAGOGASTRODUODENOSCOPY N/A 01/09/2015   Procedure: ESOPHAGOGASTRODUODENOSCOPY (EGD);  Surgeon: Inda Castle, MD;  Location: [facility];  Service: Endoscopy;  Laterality: N/A;  help with transfers  . LAPAROSCOPIC NISSEN FUNDOPLICATION    . TUBAL LIGATION    . TUMOR REMOVAL      There were no vitals filed for this visit.      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 at [facility] clinic in Aug. 2013;Pt. had UTI on 04-11-15; went to ED - received catherization; states she has had increased weakness and some mild decline in mobility since this problem   Patient Stated Goals improve walking and mobility   Currently in Pain? No/denies                         [facility] Adult PT Treatment/Exercise - 07/03/16 0001      Ambulation/Gait   Ambulation/Gait Yes   Ambulation/Gait Assistance 3: Mod assist   Ambulation Distance (Feet) 75 Feet  12, 20,15, 25' - 4 additional reps of ambulation   Assistive device Rolling walker   Gait Pattern Wide base of support;Decreased hip/knee flexion - right;Decreased hip/knee flexion - left;Decreased stride length;Step-through pattern;Abducted- right;Decreased trunk rotation;Decreased step length - right;Decreased step length - left;Festinating   Ambulation Surface Level;Indoor   Gait Comments R foot freezes and supinates due to incr. tone     Pt gait trained inside //  bars - 10'x 4 reps - amb. Forward, then backward with min to mod assist - 2 reps             PT Short Term Goals - 05/31/16 1301      PT SHORT TERM GOAL #1   Title Pt. will increase standing tolerance so she is able to report standing 3" at home with min to [place] for independence with ADL's. (06-25-16)   Status New     PT SHORT TERM GOAL #2   Title Amb. with RW 60' with mod assist.  (06-25-16)   Baseline 20' on 05-26-16   Status New     PT SHORT TERM GOAL #3   Title Transfer sit to stand from wheelchair with mod assist   (06-25-16)   Status New     PT SHORT TERM GOAL #4   Title Independent in HEP for stretching and strengthening.  (06-25-16)   Time 4   Period Weeks   Status New            PT Long Term Goals - 05/31/16 1304      PT LONG TERM GOAL #1   Title Pt. will ambulate 109'  nonstop with RW to demo incr. endurance with CGA  (07-25-16)   Baseline 20' on 05-26-16   Time 8   Period Weeks   Status New     PT LONG TERM GOAL #2   Title Report ability to stand at least 5" with UE support with min assist at home for improved standing tolerance  (07-25-16)   Time 8   Period Weeks   Status New     PT LONG TERM GOAL #3   Title Perform sit to stand transfer from mat to RW with +1 mod assist. (07-25-16)   Time 8   Period Weeks   Status New     PT LONG TERM GOAL #4   Title Amb. 15' in </= 3" to demo incr. gait speed.  (07-25-16)   Baseline 15' in 5" 28 secs on 05-26-16   Time 8   Period Weeks   Status New     PT LONG TERM GOAL #5   Title Independent to direct assistance prn with stretches and exercises for HEP.  (07-25-16)   Time 8   Period Weeks   Status New               Plan - 07/03/16 1508    PT Next Visit Plan --   PT Home Exercise Plan --   Consulted and Agree with Plan of Care --      Patient will benefit from skilled therapeutic intervention in order to improve the following deficits and impairments:  Abnormal gait, Decreased coordination, Impaired flexibility, Difficulty walking, Decreased balance, Increased muscle spasms, Impaired tone, Decreased mobility, Decreased strength, Decreased activity tolerance, Decreased range of motion  Visit Diagnosis: Other abnormalities of gait and mobility  Abnormal posture  Unsteadiness on feet     Problem List Patient Active Problem List   Diagnosis Date Noted  . General weakness 02/26/2016  . Chills 02/26/2016  . Other fatigue 02/04/2016  . Neurogenic bladder   . Chronic cystitis 04/18/2015  . Pedal edema 04/01/2015  . Dyspepsia 01/09/2015  . Constipation 12/14/2014  . Medicare annual wellness visit, subsequent 11/27/2014  . Advanced care planning/counseling discussion 11/27/2014  .  Osteoporosis   . Vitamin B12 deficiency 06/07/2014  . Recurrent falls 02/06/2014  . Multiple system atrophy ([place])   .  Dysphagia, unspecified(787.20) 07/28/2013  . Shoulder pain, left 07/20/2012  . 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 Routine health maintenance 08/26/2011  . Murmur 05/27/2011  . HYPERCHOLESTEROLEMIA  IIA 05/14/2010  . VENOUS INSUFFICIENCY, LEGS 06/21/2009  . CORONARY HEART DISEASE 06/09/2008  . Hypothyroidism 05/15/2008  . ASTHMA 05/15/2008  . Depression 11/07/2007  . 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 GERD 11/07/2007  . VAGINITIS, ATROPHIC 11/07/2007    Alda Lea, PT 07/03/2016, 3:11 PM  [facility] [address] [place], [place], [address] Phone: [phone]   Fax:  [phone]  Name: [name] [name] MRN: [id number] Date of Birth: December 02, 1940

## 2016-07-05 ENCOUNTER — Other Ambulatory Visit: Payer: Self-pay | Admitting: Family Medicine

## 2016-07-07 ENCOUNTER — Ambulatory Visit: Payer: Medicare Other | Admitting: Physical Therapy

## 2016-07-07 DIAGNOSIS — R29818 Other symptoms and signs involving the nervous system: Secondary | ICD-10-CM | POA: Diagnosis not present

## 2016-07-07 DIAGNOSIS — R2681 Unsteadiness on feet: Secondary | ICD-10-CM | POA: Diagnosis not present

## 2016-07-07 DIAGNOSIS — R2689 Other abnormalities of gait and mobility: Secondary | ICD-10-CM

## 2016-07-07 DIAGNOSIS — R293 Abnormal posture: Secondary | ICD-10-CM | POA: Diagnosis not present

## 2016-07-08 NOTE — Therapy (Signed)
[facility] [address] [place], [place], [address] Phone: [phone]   Fax:  [phone]  Physical Therapy Treatment  Patient Details  Name: [name] [name] MRN: [id number] Date of Birth: 11-08-1941 Referring Provider: Dr. Star Age  Encounter Date: 07/07/2016      PT End of Session - 07/08/16 1510    Visit Number 8   Number of Visits 17   Date for PT Re-Evaluation 07/25/16   Authorization Type [facility]HC [facility]   Authorization Time Period 05-26-16 - 07-25-16   PT Start Time 1446   PT Stop Time 1532   PT Time Calculation (min) 46 min   Equipment Utilized During Treatment Gait belt      Past Medical History:  Diagnosis Date  . Asthma   . CAD (coronary artery disease)    myoview 5/08:  EF 76%, no scar, no ischemia, +ECG changes with exercise;  cath 04/08/07:  pLAD 30%, mLAD 60-70% - med tx.  . Chronic cystitis    recurrent UTIs, started bactrim ppx (McDiarmid)  . Depression   . Familial tremor    followed by Dr. Erling Cruz  . GERD (gastroesophageal reflux disease)    s/p nissen  . History of pneumonia   . HLD (hyperlipidemia)   . Hydronephrosis, bilateral   . Hypothyroidism   . Internal hemorrhoids   . 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 Neurogenic bladder    Tannenbaum/MacDiarmid  . Osteoporosis 2016   DEXA T-4.4 spine deteriorated since 2012  . Unspecified chronic bronchitis ([place])   . VAGINITIS, ATROPHIC 11/07/2007    Past Surgical History:  Procedure Laterality Date  . COLONOSCOPY N/A 08/29/2013   int hemm; Inda Castle, MD  . CYSTECTOMY    . DEXA  12/2014   T -4.4 spine  . ESOPHAGOGASTRODUODENOSCOPY N/A 01/09/2015   Procedure: ESOPHAGOGASTRODUODENOSCOPY (EGD);  Surgeon: Inda Castle, MD;  Location: [facility];  Service: Endoscopy;  Laterality: N/A;  help with transfers  . LAPAROSCOPIC NISSEN FUNDOPLICATION    . TUBAL LIGATION    . TUMOR REMOVAL      There were no vitals  filed for this visit.      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 at [facility] clinic in Aug. 2013;Pt. had UTI on 04-11-15; went to ED - received catherization; states she has had increased weakness and some mild decline in mobility since this problem   Patient Stated Goals improve walking and mobility   Currently in Pain? No/denies                         [facility] Adult PT Treatment/Exercise - 07/08/16 0001      Ambulation/Gait   Ambulation/Gait Yes   Ambulation/Gait Assistance 3: Mod assist   Ambulation Distance (Feet) 85 Feet   Assistive device Rolling walker   Gait Pattern Wide base of support;Decreased hip/knee flexion - right;Decreased hip/knee flexion - left;Decreased stride length;Step-through pattern;Abducted- right;Decreased trunk rotation;Decreased step length - right;Decreased step length - left;Festinating   Ambulation Surface Level;Indoor   Gait Comments R foot freezes and supinates due to incr. tone     Knee/Hip Exercises: Aerobic   Stationary Bike Nustep level 1 x 15" with UE's and LE's  no charge as  unsupervised      Pt performed standing balance activities inside bars prior to gait training - amb. Forward and backward inside bars 2 reps (40') prior to Seated rest period; 1 additional rep forward and back; 2 reps sidestepping inside bars with seated rest after 20' inside bars - min to mod assist Needed to maintain balance  Sit to stand from wheelchair with mod assist             PT Short Term Goals - 07/08/16 1511      PT SHORT TERM GOAL #1   Title Pt. will increase standing tolerance so she is able to report standing 3" at home with min to [place] for independence with ADL's. (06-25-16)   Baseline varies - functional status varies day to day - at times able to stand for 3"  and other times unable - 07-07-16   Status On-going     PT SHORT TERM GOAL #2   Title Amb. with RW 92' with mod assist.  (06-25-16)   Baseline met on 07-07-16 but performance varies   Status On-going     PT SHORT TERM GOAL #3   Title Transfer sit to stand from wheelchair with mod assist   (06-25-16)   Baseline inconsistently met as performance varies - 07-07-16   Time 4   Period Weeks   Status On-going     PT SHORT TERM GOAL #4   Title Independent in HEP for stretching and strengthening.  (06-25-16)   Baseline met 07-07-16   Status Achieved           PT Long Term Goals - 05/31/16 1304      PT LONG TERM GOAL #1   Title Pt. will ambulate 14'  nonstop with RW to demo incr. endurance with CGA  (07-25-16)   Baseline 20' on 05-26-16   Time 8   Period Weeks   Status New     PT LONG TERM GOAL #2   Title Report ability to stand at least 5" with UE support with min assist at home for improved standing tolerance  (07-25-16)   Time 8   Period Weeks   Status New     PT LONG TERM GOAL #3   Title Perform sit to stand transfer from mat to RW with +1 mod assist. (07-25-16)   Time 8   Period Weeks   Status New     PT LONG TERM GOAL #4   Title Amb. 15' in </= 3" to demo incr. gait speed.  (07-25-16)   Baseline 15' in 5" 28 secs on 05-26-16   Time 8   Period Weeks   Status New     PT LONG TERM GOAL #5   Title Independent to direct assistance prn with stretches and exercises for HEP.  (07-25-16)   Time 8   Period Weeks   Status New             Patient will benefit from skilled therapeutic intervention in order to improve the following deficits and impairments:     Visit Diagnosis: Other abnormalities of gait and mobility  Unsteadiness on feet  Other symptoms and signs involving the nervous system     Problem List Patient Active Problem List   Diagnosis Date Noted  . General weakness 02/26/2016  . Chills 02/26/2016  . Other fatigue 02/04/2016  . Neurogenic bladder    . Chronic cystitis 04/18/2015  . Pedal edema 04/01/2015  . Dyspepsia 01/09/2015  . Constipation 12/14/2014  .  Medicare annual wellness visit, subsequent 11/27/2014  . Advanced care planning/counseling discussion 11/27/2014  . Osteoporosis   . Vitamin B12 deficiency 06/07/2014  . Recurrent falls 02/06/2014  . Multiple system atrophy ([place])   . Dysphagia, unspecified(787.20) 07/28/2013  . Shoulder pain, left 07/20/2012  . 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 Routine health maintenance 08/26/2011  . Murmur 05/27/2011  . HYPERCHOLESTEROLEMIA  IIA 05/14/2010  . VENOUS INSUFFICIENCY, LEGS 06/21/2009  . CORONARY HEART DISEASE 06/09/2008  . Hypothyroidism 05/15/2008  . ASTHMA 05/15/2008  . Depression 11/07/2007  . 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 GERD 11/07/2007  . VAGINITIS, ATROPHIC 11/07/2007    Alda Lea, PT 07/08/2016, 3:15 PM  [facility] [address] [place], [place], [address] Phone: [phone]   Fax:  [phone]  Name: [name] [name] MRN: [id number] Date of Birth: [birth year]

## 2016-07-09 ENCOUNTER — Other Ambulatory Visit: Payer: Self-pay | Admitting: Cardiovascular Disease

## 2016-07-09 ENCOUNTER — Ambulatory Visit: Payer: Medicare Other | Admitting: Physical Therapy

## 2016-07-14 ENCOUNTER — Ambulatory Visit: Payer: Medicare Other | Admitting: Physical Therapy

## 2016-07-14 ENCOUNTER — Telehealth: Payer: Self-pay | Admitting: Family Medicine

## 2016-07-14 NOTE — Telephone Encounter (Signed)
Patient Name: NEVAH DALAL DOB: 12/23/1940 Initial Comment Caller states having chills really bad and weakness; has terminal illness; multiple system atrophy (SP); 97.3 temp but normally runs lower than 98; Nurse Assessment Nurse: Roosvelt Maser, RN, [facility] [place] Date/Time (Eastern Time): 07/14/2016 11:54:02 AM Confirm and document reason for call. If symptomatic, describe symptoms. You must click the next button to save text entered. ---caller states she has had chills and weakness generalized for several days. no fever Has the patient traveled out of the country within the last 30 days? ---Not Applicable Does the patient have any new or worsening symptoms? ---Yes Will a triage be completed? ---Yes Related visit to physician within the last 2 weeks? ---No Does the PT have any chronic conditions? (i.e. diabetes, asthma, etc.) ---Yes List chronic conditions. ---multiple system atrophy Is this a behavioral health or substance abuse call? ---No Guidelines Guideline Title Affirmed Question Affirmed Notes Weakness (Generalized) and Fatigue [1] MODERATE weakness (i.e., interferes with work, school, normal activities) AND [2] cause unknown (Exceptions: weakness with acute minor illness, or weakness from poor fluid intake) Final Disposition User See Physician within 4 Hours (or PCP triage) Roosvelt Maser, RN, [place] [place] Comments  patient refused outcome and requests a call back. Referrals GO TO FACILITY UNDECIDED Disagree/Comply: Comply

## 2016-07-14 NOTE — Telephone Encounter (Signed)
I called and spoke with [name] [name]; feeling weak and having chills for several days. No fever. [name] [name] is not having pain. [name] [name] only wants to see Dr Darnell Level. [name] [name] scheduled appt with Dr Darnell Level on 07/16/16 at [facility]. If [name] [name] condition changes or worsens prior to appt [name] [name] will cb. FYI to Dr Darnell Level.

## 2016-07-15 ENCOUNTER — Ambulatory Visit: Payer: Self-pay | Admitting: Physical Therapy

## 2016-07-16 ENCOUNTER — Ambulatory Visit (INDEPENDENT_AMBULATORY_CARE_PROVIDER_SITE_OTHER): Payer: Medicare Other | Admitting: Family Medicine

## 2016-07-16 ENCOUNTER — Ambulatory Visit: Payer: Medicare Other | Admitting: Physical Therapy

## 2016-07-16 ENCOUNTER — Encounter: Payer: Self-pay | Admitting: Family Medicine

## 2016-07-16 VITALS — BP 128/62 | HR 70 | Temp 97.8°F

## 2016-07-16 DIAGNOSIS — R531 Weakness: Secondary | ICD-10-CM | POA: Diagnosis not present

## 2016-07-16 DIAGNOSIS — N319 Neuromuscular dysfunction of bladder, unspecified: Secondary | ICD-10-CM | POA: Diagnosis not present

## 2016-07-16 DIAGNOSIS — R6883 Chills (without fever): Secondary | ICD-10-CM | POA: Diagnosis not present

## 2016-07-16 DIAGNOSIS — E538 Deficiency of other specified B group vitamins: Secondary | ICD-10-CM

## 2016-07-16 DIAGNOSIS — N3020 Other chronic cystitis without hematuria: Secondary | ICD-10-CM | POA: Diagnosis not present

## 2016-07-16 DIAGNOSIS — Z23 Encounter for immunization: Secondary | ICD-10-CM | POA: Diagnosis not present

## 2016-07-16 LAB — POC URINALSYSI DIPSTICK (AUTOMATED)
Bilirubin, UA: NEGATIVE
Glucose, UA: NEGATIVE
KETONES UA: NEGATIVE
Leukocytes, UA: NEGATIVE
Nitrite, UA: NEGATIVE
PH UA: 6
PROTEIN UA: NEGATIVE
RBC UA: NEGATIVE
SPEC GRAV UA: 1.015
UROBILINOGEN UA: 0.2

## 2016-07-16 MED ORDER — CYANOCOBALAMIN 1000 MCG/ML IJ SOLN
1000.0000 ug | Freq: Once | INTRAMUSCULAR | Status: AC
  Administered 2016-07-16: 1000 ug via INTRAMUSCULAR

## 2016-07-16 NOTE — Assessment & Plan Note (Signed)
Check UA today. Vitals stable. Anticipate MSA related.

## 2016-07-16 NOTE — Addendum Note (Signed)
Addended by: Royann Shivers A on: 07/16/2016 04:19 PM   Modules accepted: Orders

## 2016-07-16 NOTE — Progress Notes (Signed)
Pre visit review using our clinic review tool, if applicable. No additional management support is needed unless otherwise documented below in the visit note. 

## 2016-07-16 NOTE — Patient Instructions (Addendum)
B12 shot today Flu shot today Urine checked today.  Try taking clonazepam 1/2-1 tablet in morning and at bedtime to see if you sleep better.

## 2016-07-16 NOTE — Assessment & Plan Note (Signed)
Myrbetriq has lost effect.  Discussing PTNS with uro.

## 2016-07-16 NOTE — Assessment & Plan Note (Addendum)
Continue keflex 267m daily ppx.  H/o recurrent hemorrhagic cystitis.

## 2016-07-16 NOTE — Progress Notes (Signed)
BP 128/62   Pulse 70   Temp 97.8 F (36.6 C) (Oral)    CC: chills/fatigue Subjective:    Patient ID: [name] [name], female    DOB: October 24, 1941, 75 y.o.   MRN: [id number]  HPI: [name] [name] is a 75 y.o. female presenting on 07/16/2016 for Extremity Weakness   Here with husband [name]  Known MSA previously thought worsening chills a manifestation of temperature dysregulation from autonomic instability. Saw neurology Dr Rexene Alberts 04/2016 - suggested trial baclofen for muscle spasms and warm beverages and blanket for chills. Baclofen didn't really help.   Comes in today for acute visit for 1 wk h/o chills, weakness, gen malaise. No urinary symptoms of dysuria, itching, pressure, hematuria. No unexpected weight gain. Denies cough, dyspnea, chest pain, nausea/vomiting or diarrhea. Urologist Dr Matilde Sprang manages her overactive bladder with myrbetriq.   Continues outpatient PT. Does not qualify for [facility] due to this. Desires to continue outpatient PT at this time.   Relevant past medical, surgical, family and social history reviewed and updated as indicated. Interim medical history since our last visit reviewed. Allergies and medications reviewed and updated. Current Outpatient Prescriptions on File Prior to Visit  Medication Sig  . albuterol (PROAIR HFA) 108 (90 BASE) MCG/ACT inhaler Inhale 2 puffs into the lungs every 6 (six) hours as needed for wheezing or shortness of breath. Reported on 01/22/2016  . aspirin (ASPIRIN EC) 81 MG EC tablet Take 81 mg by mouth 2 (two) times a week. Swallow whole. Take at bedtime  . clonazePAM (KLONOPIN) 0.5 MG tablet Take 0.5 mg by mouth 2 (two) times daily as needed (tremors).  . Cranberry (ELLURA) 200 MG CAPS Take 1 capsule by mouth every evening.   Marland Kitchen CRESTOR 20 MG tablet TAKE ONE-HALF TABLET BY MOUTH ONCE EVERY EVENING  . cyanocobalamin (,VITAMIN B-12,) 1000 MCG/ML injection Inject 1,000 mcg into the muscle every 30 (thirty) days. Take the first of the month  .  dexlansoprazole (DEXILANT) 60 MG capsule Take 60 mg by mouth daily as needed. For acid reflux  . famotidine (PEPCID) 40 MG tablet TAKE 1 TABLET (40 MG TOTAL) BY MOUTH 2 (TWO) TIMES DAILY AS NEEDED FOR HEARTBURN.  . furosemide (LASIX) 20 MG tablet Take 1 tablet (20 mg total) by mouth daily as needed for fluid or edema.  . hyoscyamine (LEVSIN SL) 0.125 MG SL tablet PLACE 2 TABLETS (0.25 MG TOTAL) UNDER THE TONGUE EVERY 4 (FOUR) HOURS AS NEEDED (SPASMS).  . isosorbide mononitrate (IMDUR) 30 MG 24 hr tablet TAKE 1/2 TABLET BY MOUTH DAILY  . MYRBETRIQ 50 MG TB24 tablet Take 50 mg by mouth daily.  . nitroGLYCERIN (NITROSTAT) 0.4 MG SL tablet Place 1 tablet (0.4 mg total) under the tongue every 5 (five) minutes as needed (chest pain).  . polyethylene glycol powder (GLYCOLAX/MIRALAX) powder Take 17 g by mouth daily as needed for moderate constipation.  . propranolol (INDERAL) 10 MG tablet TAKE 1 TABLET BY MOUTH 3 TIMES DAILY AS NEEDED (TREMORS).  Marland Kitchen sertraline (ZOLOFT) 25 MG tablet Take 1 tablet (25 mg total) by mouth daily.  Marland Kitchen SYNTHROID 112 MCG tablet TAKE 1 TABLET (112 MCG TOTAL) BY MOUTH DAILY BEFORE BREAKFAST.   No current facility-administered medications on file prior to visit.     Review of Systems Per HPI unless specifically indicated in ROS section     Objective:    BP 128/62   Pulse 70   Temp 97.8 F (36.6 C) (Oral)   Wt Readings from Last  3 Encounters:  03/17/16 145 lb (65.8 kg)  02/04/16 152 lb 8 oz (69.2 kg)  04/03/15 145 lb (65.8 kg)    Physical Exam  Constitutional: She appears well-developed and well-nourished. No distress.  In wheelchair  HENT:  Mouth/Throat: Oropharynx is clear and moist. No oropharyngeal exudate.  Eyes: Conjunctivae are normal. Pupils are equal, round, and reactive to light.  Cardiovascular: Normal rate, regular rhythm, normal heart sounds and intact distal pulses.   No murmur heard. Pulmonary/Chest: Effort normal and breath sounds normal. No  respiratory distress. She has no wheezes. She has no rales.  Abdominal: Soft. Bowel sounds are normal. She exhibits no distension and no mass. There is no tenderness. There is no rebound and no guarding.  Musculoskeletal: She exhibits edema (tr pedal).  Neurological:  tremor present  Skin: Skin is warm and dry. No rash noted.  Psychiatric: She has a normal mood and affect.  Nursing note and vitals reviewed.  Results for orders placed or performed during the hospital encounter of 03/17/16  Culture, blood (routine x 2)  Result Value Ref Range   Specimen Description BLOOD RIGHT ANTECUBITAL    Special Requests BOTTLES DRAWN AEROBIC AND ANAEROBIC 5CC    Culture NO GROWTH 5 DAYS    Report Status 03/22/2016 FINAL   Basic metabolic panel  Result Value Ref Range   Sodium 140 135 - 145 mmol/L   Potassium 4.2 3.5 - 5.1 mmol/L   Chloride 110 101 - 111 mmol/L   CO2 20 (L) 22 - 32 mmol/L   Glucose, Bld 104 (H) 65 - 99 mg/dL   BUN 15 6 - 20 mg/dL   Creatinine, Ser 0.72 0.44 - 1.00 mg/dL   Calcium 9.6 8.9 - 10.3 mg/dL   GFR calc non Af Amer >60 >60 mL/min   GFR calc Af Amer >60 >60 mL/min   Anion gap 10 5 - 15  CBC  Result Value Ref Range   WBC 5.5 4.0 - 10.5 K/uL   RBC 4.56 3.87 - 5.11 MIL/uL   Hemoglobin 13.3 12.0 - 15.0 g/dL   HCT 40.0 36.0 - 46.0 %   MCV 87.7 78.0 - 100.0 fL   MCH 29.2 26.0 - 34.0 pg   MCHC 33.3 30.0 - 36.0 g/dL   RDW 13.0 11.5 - 15.5 %   Platelets 249 150 - 400 K/uL  Urinalysis, Routine w reflex microscopic  Result Value Ref Range   Color, Urine YELLOW YELLOW   APPearance CLEAR CLEAR   Specific Gravity, Urine 1.008 1.005 - 1.030   pH 6.5 5.0 - 8.0   Glucose, UA NEGATIVE NEGATIVE mg/dL   Hgb urine dipstick NEGATIVE NEGATIVE   Bilirubin Urine NEGATIVE NEGATIVE   Ketones, ur NEGATIVE NEGATIVE mg/dL   Protein, ur NEGATIVE NEGATIVE mg/dL   Nitrite NEGATIVE NEGATIVE   Leukocytes, UA NEGATIVE NEGATIVE  I-Stat CG4 Lactic Acid, ED  Result Value Ref Range   Lactic  Acid, Venous 0.86 0.5 - 2.0 mmol/L   Lab Results  Component Value Date   VITAMINB12 304 02/04/2016   Lab Results  Component Value Date   ALT 15 02/04/2016   AST 17 02/04/2016   ALKPHOS 120 (H) 02/04/2016   BILITOT 0.4 02/04/2016       Assessment & Plan:   Problem List Items Addressed This Visit    Chills - Primary    Check UA today. Vitals stable. Anticipate MSA related.       Chronic cystitis    Continue keflex 237m daily  ppx.  H/o recurrent hemorrhagic cystitis.       General weakness    Check UA today. Vitals stable. Anticipate MSA related.       Neurogenic bladder    Myrbetriq has lost effect.  Discussing PTNS with uro.        Other Visit Diagnoses   None.      Follow up plan: Return if symptoms worsen or fail to improve.  Ria Bush, MD

## 2016-07-21 ENCOUNTER — Ambulatory Visit: Payer: Medicare Other | Attending: Family Medicine | Admitting: Physical Therapy

## 2016-07-21 DIAGNOSIS — R2689 Other abnormalities of gait and mobility: Secondary | ICD-10-CM | POA: Diagnosis not present

## 2016-07-21 DIAGNOSIS — M6281 Muscle weakness (generalized): Secondary | ICD-10-CM | POA: Diagnosis not present

## 2016-07-21 DIAGNOSIS — R2681 Unsteadiness on feet: Secondary | ICD-10-CM | POA: Insufficient documentation

## 2016-07-21 DIAGNOSIS — R29818 Other symptoms and signs involving the nervous system: Secondary | ICD-10-CM | POA: Diagnosis not present

## 2016-07-22 NOTE — Therapy (Signed)
[facility] [address] [place], [place], [address] Phone: [phone]   Fax:  [phone]  Physical Therapy Treatment  Patient Details  Name: [name] [name] MRN: [id number] Date of Birth: 1941-09-19 Referring Provider: Dr. Star Age  Encounter Date: 07/21/2016      PT End of Session - 07/22/16 1958    Visit Number 9   Number of Visits 17   Date for PT Re-Evaluation 07/25/16   Authorization Type UHC [facility]   Authorization Time Period 05-26-16 - 07-25-16   PT Start Time 1447   PT Stop Time 1536   PT Time Calculation (min) 49 min   Equipment Utilized During Treatment Gait belt      Past Medical History:  Diagnosis Date  . Asthma   . CAD (coronary artery disease)    myoview 5/08:  EF 76%, no scar, no ischemia, +ECG changes with exercise;  cath 04/08/07:  pLAD 30%, mLAD 60-70% - med tx.  . Chronic cystitis    recurrent UTIs, started bactrim ppx (McDiarmid)  . Depression   . Familial tremor    followed by Dr. Erling Cruz  . GERD (gastroesophageal reflux disease)    s/p nissen  . History of pneumonia   . HLD (hyperlipidemia)   . Hydronephrosis, bilateral   . Hypothyroidism   . Internal hemorrhoids   . 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 Neurogenic bladder    Tannenbaum/MacDiarmid  . Osteoporosis 2016   DEXA T-4.4 spine deteriorated since 2012  . Unspecified chronic bronchitis ([place])   . VAGINITIS, ATROPHIC 11/07/2007    Past Surgical History:  Procedure Laterality Date  . COLONOSCOPY N/A 08/29/2013   int hemm; Inda Castle, MD  . CYSTECTOMY    . DEXA  12/2014   T -4.4 spine  . ESOPHAGOGASTRODUODENOSCOPY N/A 01/09/2015   Procedure: ESOPHAGOGASTRODUODENOSCOPY (EGD);  Surgeon: Inda Castle, MD;  Location: [facility];  Service: Endoscopy;  Laterality: N/A;  help with transfers  . LAPAROSCOPIC NISSEN FUNDOPLICATION    . TUBAL LIGATION    . TUMOR REMOVAL      There were no vitals  filed for this visit.      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 at [facility] clinic in Aug. 2013;Pt. had UTI on 04-11-15; went to ED - received catherization; states she has had increased weakness and some mild decline in mobility since this problem   Patient Stated Goals improve walking and mobility   Currently in Pain? No/denies                         [facility] Adult PT Treatment/Exercise - 07/22/16 0001      Bed Mobility   Rolling Right 4: Min assist   Rolling Right Details (indicate cue type and reason) UE extended out - PWR! move   cues to bend opposite leg to push hip over   Rolling Left 4: Min assist     Transfers   Transfers Sit to Stand   Sit to Stand 2: Max assist   Comments used swivel transfer disc to educate pt in available DME to assist with transfers     Ambulation/Gait  Ambulation/Gait Yes   Ambulation/Gait Assistance 3: Mod assist   Ambulation Distance (Feet) 25 Feet  50' 2nd rep   Assistive device Rolling walker   Gait Pattern Wide base of support;Decreased hip/knee flexion - right;Decreased hip/knee flexion - left;Decreased stride length;Step-through pattern;Abducted- right;Decreased trunk rotation;Decreased step length - right;Decreased step length - left;Festinating   Ambulation Surface Level;Indoor   Gait Comments R foot freezes and supinates due to incr. tone     Lumbar Exercises: Stretches   Lower Trunk Rotation 2 reps;30 seconds     Lumbar Exercises: Supine   Bridge 10 reps  2 sets     Pt requires +2 for safety with amb. With 2nd person pushing wheelchair as pt fatigues quickly and needs to sit down   TherEx:  Seated trunk stretches - both hands on each side - 30 sec hold Leaning down on each forearm - reaching  overhead with other arm for trunk stretching - 30 sec hold x 2 reps each side          PT Short Term Goals - 07/22/16 2000      PT SHORT TERM GOAL #1   Title Pt. will increase standing tolerance so she is able to report standing 3" at home with min to CGA for independence with ADL's. (06-25-16)   Baseline varies - functional status varies day to day - at times able to stand for 3" and other times unable - 07-07-16   Time 4   Period Weeks   Status On-going     PT SHORT TERM GOAL #2   Title Amb. with RW 36' with mod assist.  (06-25-16)   Baseline met on 07-07-16 but performance varies   Period Weeks   Status On-going     PT SHORT TERM GOAL #3   Title Transfer sit to stand from wheelchair with mod assist   (06-25-16)   Baseline inconsistently met as performance varies - 07-07-16   Time 4   Period Weeks   Status On-going     PT SHORT TERM GOAL #4   Title Independent in HEP for stretching and strengthening.  (06-25-16)   Baseline met 07-07-16   Status Achieved     PT SHORT TERM GOAL #5   Title Improve TUG score to </= 50 secs with RW with CGA (06-19-15)   Status Achieved           PT Long Term Goals - 07/22/16 2001      PT LONG TERM GOAL #1   Title Pt. will ambulate 29'  nonstop with RW to demo incr. endurance with CGA  (07-25-16)   Baseline 20' on 05-26-16   Time 8   Status New     PT LONG TERM GOAL #2   Title Report ability to stand at least 5" with UE support with min assist at home for improved standing tolerance  (07-25-16)   Baseline 1"-2" reported - with assist needed due to balance deficits   Time 8   Period Weeks   Status New     PT LONG TERM GOAL #3   Title Perform sit to stand transfer from mat to RW with +1 mod assist. (07-25-16)   Baseline mod assist needed    Time 8   Period Weeks   Status New     PT LONG TERM GOAL #4   Title Amb. 15' in </= 3" to demo incr. gait speed.  (07-25-16)   Baseline 15' in 5" 28 secs on 05-26-16  Time 8   Period Weeks    Status New     PT LONG TERM GOAL #5   Title Independent to direct assistance prn with stretches and exercises for HEP.  (07-25-16)   Time 8   Status New               Plan - 07/22/16 1959    Clinical Impression Statement Pt having more difficulty with mobility and ambulation - R foot supinating and freezing more frequently - needs assistance with weight shift to assist in advancing for step initiation   Rehab Potential Good   Clinical Impairments Affecting Rehab Potential severity of deficits and progressive neurological disease   PT Frequency 2x / week   PT Duration 8 weeks   PT Treatment/Interventions ADLs/Self Care Home Management;Therapeutic activities;Patient/family education;Therapeutic exercise;Gait training;Balance training;Stair training;Neuromuscular re-education;Functional mobility training   PT Next Visit Plan  stretches, seated PWR exercises: gait train with RW as able   PT Home Exercise Plan stretches and supine strengthening exs.   Consulted and Agree with Plan of Care Patient      Patient will benefit from skilled therapeutic intervention in order to improve the following deficits and impairments:  Abnormal gait, Decreased coordination, Impaired flexibility, Difficulty walking, Decreased balance, Increased muscle spasms, Impaired tone, Decreased mobility, Decreased strength, Decreased activity tolerance, Decreased range of motion  Visit Diagnosis: Other abnormalities of gait and mobility  Other symptoms and signs involving the nervous system     Problem List Patient Active Problem List   Diagnosis Date Noted  . General weakness 02/26/2016  . Chills 02/26/2016  . Other fatigue 02/04/2016  . Neurogenic bladder   . Chronic cystitis 04/18/2015  . Pedal edema 04/01/2015  . Dyspepsia 01/09/2015  . Constipation 12/14/2014  . Medicare annual wellness visit, subsequent 11/27/2014  . Advanced care planning/counseling discussion 11/27/2014  . Osteoporosis   .  Vitamin B12 deficiency 06/07/2014  . Recurrent falls 02/06/2014  . Multiple system atrophy ([place])   . Dysphagia, unspecified(787.20) 07/28/2013  . Shoulder pain, left 07/20/2012  . 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 Routine health maintenance 08/26/2011  . Murmur 05/27/2011  . HYPERCHOLESTEROLEMIA  IIA 05/14/2010  . VENOUS INSUFFICIENCY, LEGS 06/21/2009  . CORONARY HEART DISEASE 06/09/2008  . Hypothyroidism 05/15/2008  . ASTHMA 05/15/2008  . Depression 11/07/2007  . 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 GERD 11/07/2007  . VAGINITIS, ATROPHIC 11/07/2007    Alda Lea, PT 07/22/2016, 8:04 PM  [facility] [address] [place], [place], [address] Phone: [phone]   Fax:  [phone]  Name: [name] [name] MRN: [id number] Date of Birth: 12-12-1940

## 2016-07-23 ENCOUNTER — Ambulatory Visit: Payer: Medicare Other | Admitting: Physical Therapy

## 2016-07-23 DIAGNOSIS — M6281 Muscle weakness (generalized): Secondary | ICD-10-CM | POA: Diagnosis not present

## 2016-07-23 DIAGNOSIS — R2689 Other abnormalities of gait and mobility: Secondary | ICD-10-CM | POA: Diagnosis not present

## 2016-07-23 DIAGNOSIS — R29818 Other symptoms and signs involving the nervous system: Secondary | ICD-10-CM

## 2016-07-23 DIAGNOSIS — R2681 Unsteadiness on feet: Secondary | ICD-10-CM | POA: Diagnosis not present

## 2016-07-24 NOTE — Therapy (Signed)
[facility] [address] [place], [place], [address] Phone: [phone]   Fax:  [phone]  Physical Therapy Treatment  Patient Details  Name: [name] [name] MRN: [id number] Date of Birth: 1941-09-09 Referring Provider: Dr. Star Age  Encounter Date: 07/23/2016      PT End of Session - 07/24/16 1035    Visit Number 10  G10   Number of Visits 17   Date for PT Re-Evaluation 07/25/16   Authorization Type [employer] [facility]   Authorization Time Period 05-26-16 - 07-25-16   PT Start Time 1453   PT Stop Time 1533   PT Time Calculation (min) 40 min   Equipment Utilized During Treatment Gait belt      Past Medical History:  Diagnosis Date  . Asthma   . CAD (coronary artery disease)    myoview 5/08:  EF 76%, no scar, no ischemia, +ECG changes with exercise;  cath 04/08/07:  pLAD 30%, mLAD 60-70% - med tx.  . Chronic cystitis    recurrent UTIs, started bactrim ppx (McDiarmid)  . Depression   . Familial tremor    followed by Dr. Erling Cruz  . GERD (gastroesophageal reflux disease)    s/p nissen  . History of pneumonia   . HLD (hyperlipidemia)   . Hydronephrosis, bilateral   . Hypothyroidism   . Internal hemorrhoids   . 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 Neurogenic bladder    Tannenbaum/MacDiarmid  . Osteoporosis 2016   DEXA T-4.4 spine deteriorated since 2012  . Unspecified chronic bronchitis ([place])   . VAGINITIS, ATROPHIC 11/07/2007    Past Surgical History:  Procedure Laterality Date  . COLONOSCOPY N/A 08/29/2013   int hemm; Inda Castle, MD  . CYSTECTOMY    . DEXA  12/2014   T -4.4 spine  . ESOPHAGOGASTRODUODENOSCOPY N/A 01/09/2015   Procedure: ESOPHAGOGASTRODUODENOSCOPY (EGD);  Surgeon: Inda Castle, MD;  Location: [facility];  Service: Endoscopy;  Laterality: N/A;  help with transfers  . LAPAROSCOPIC NISSEN FUNDOPLICATION    . TUBAL LIGATION    . TUMOR REMOVAL      There were no  vitals filed for this visit.      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 at [facility] clinic in Aug. 2013;Pt. had UTI on 04-11-15; went to ED - received catherization; states she has had increased weakness and some mild decline in mobility since this problem   Patient Stated Goals improve walking and mobility   Currently in Pain? No/denies                         [facility] Adult PT Treatment/Exercise - 07/24/16 0001      Bed Mobility   Rolling Right 4: Min assist   Rolling Right Details (indicate cue type and reason) UE extended out - PWR! move   Rolling Left 4: Min assist     Transfers   Transfers Sit to Stand   Sit to Stand 3: Mod assist   Stand to Sit 3: Mod assist   Comments used swivel transfer disc to educate pt in available DME to assist with transfers     Ambulation/Gait   Ambulation/Gait Yes  Ambulation/Gait Assistance 3: Mod assist   Ambulation Distance (Feet) 25 Feet  approx. 44' 2nd rep   Assistive device Rolling walker   Gait Pattern Wide base of support;Decreased hip/knee flexion - right;Decreased hip/knee flexion - left;Decreased stride length;Step-through pattern;Abducted- right;Decreased trunk rotation;Decreased step length - right;Decreased step length - left;Festinating   Ambulation Surface Level;Indoor     Lumbar Exercises: Stretches   Lower Trunk Rotation 2 reps;30 seconds     Lumbar Exercises: Supine   Bridge 10 reps     Knee/Hip Exercises: Seated   Other Seated Knee/Hip Exercises alternate LAQ 10 reps each leg                   PT Short Term Goals - 07/22/16 2000      PT SHORT TERM GOAL #1   Title Pt. will increase standing tolerance so she is able to report standing 3" at home with min to CGA for  independence with ADL's. (06-25-16)   Baseline varies - functional status varies day to day - at times able to stand for 3" and other times unable - 07-07-16   Time 4   Period Weeks   Status On-going     PT SHORT TERM GOAL #2   Title Amb. with RW 20' with mod assist.  (06-25-16)   Baseline met on 07-07-16 but performance varies   Period Weeks   Status On-going     PT SHORT TERM GOAL #3   Title Transfer sit to stand from wheelchair with mod assist   (06-25-16)   Baseline inconsistently met as performance varies - 07-07-16   Time 4   Period Weeks   Status On-going     PT SHORT TERM GOAL #4   Title Independent in HEP for stretching and strengthening.  (06-25-16)   Baseline met 07-07-16   Status Achieved     PT SHORT TERM GOAL #5   Title Improve TUG score to </= 50 secs with RW with CGA (06-19-15)   Status Achieved           PT Long Term Goals - 07/24/16 1036      PT LONG TERM GOAL #1   Title Pt. will ambulate 59'  nonstop with RW to demo incr. endurance with CGA  (07-25-16)  EXTEND UNMET LTG'S til 08-22-16   Baseline pt amb. 24' with mod assist with RW on 07-23-16   Time 4   Period Weeks   Status On-going  LTG continued as unmet as of 07-23-16     PT LONG TERM GOAL #2   Title Report ability to stand at least 5" with UE support with min assist at home for improved standing tolerance  (07-25-16)/ 08-22-16   Baseline 1"-2" reported - with assist needed due to balance deficits - performance varies due to fluctuations in status   Time 4   Period Weeks   Status On-going     PT LONG TERM GOAL #3   Title Perform sit to stand transfer from mat to RW with +1 mod assist. (07-25-16)/ 08-22-16   Baseline mod to max assist - performance varies   Time 4   Period Weeks   Status On-going     PT LONG TERM GOAL #4   Title Amb. 15' in </= 3" to demo incr. gait speed.  (07-25-16)   Time 4   Period Weeks   Status On-going     PT LONG TERM GOAL #5   Title Independent to direct assistance prn  with stretches and exercises for HEP.  (07-25-16)   Status Achieved             Patient will benefit from skilled therapeutic intervention in order to improve the following deficits and impairments:     Visit Diagnosis: Other abnormalities of gait and mobility  Other symptoms and signs involving the nervous system       G-Codes - Jul 29, 2016 1041    Functional Assessment Tool Used Pt requires mod to max assist with transfers; amb. distance varies depending on severity of freezing episodes RLE - 25' - 60'   Functional Limitation Mobility: Walking and moving around   Mobility: Walking and Moving Around Current Status [phone]) At least 80 percent but less than 100 percent impaired, limited or restricted   Mobility: Walking and Moving Around Goal Status [phone]) At least 60 percent but less than 80 percent impaired, limited or restricted      Problem List Patient Active Problem List   Diagnosis Date Noted  . General weakness 02/26/2016  . Chills 02/26/2016  . Other fatigue 02/04/2016  . Neurogenic bladder   . Chronic cystitis 04/18/2015  . Pedal edema 04/01/2015  . Dyspepsia 01/09/2015  . Constipation 12/14/2014  . Medicare annual wellness visit, subsequent 11/27/2014  . Advanced care planning/counseling discussion 11/27/2014  . Osteoporosis   . Vitamin B12 deficiency 06/07/2014  . Recurrent falls 02/06/2014  . Multiple system atrophy ([place])   . Dysphagia, unspecified(787.20) 07/28/2013  . Shoulder pain, left 07/20/2012  . 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 Routine health maintenance 08/26/2011  . Murmur 05/27/2011  . HYPERCHOLESTEROLEMIA  IIA 05/14/2010  . VENOUS INSUFFICIENCY, LEGS 06/21/2009  . CORONARY HEART DISEASE 06/09/2008  . Hypothyroidism 05/15/2008  . ASTHMA 05/15/2008  . Depression 11/07/2007  . 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 GERD 11/07/2007  . VAGINITIS, ATROPHIC 11/07/2007    Physical Therapy Progress Note  Dates of Reporting Period: 05-26-16  to July 29, 2016  Objective Reports of Subjective Statement:  Pt requires mod to max assist with transfers and mod assist with amb. - distance varies - 10' - 60' max on 2016-07-29  Objective Measurements: Mod to max assist with transfers and ambulation; pt able to stand with UE support on RW with min to mod assist - unable to stand without UE support  Goal Update: see above for progress towards LTG's - goals not met as status fluctuates with freezing episodes and tightness/spasticity in trunk, UE's and LE's  Plan: see above for treatment plan  Reason Skilled Services are Required: continued decreased strength with spasticity in trunk, UE's and LE's: decr. Standing balance and dependency with gait and transfers   Anaih Brander, [place], PT 07/24/2016, 10:43 AM  [facility] [address] [place], [place], [address] Phone: [phone]   Fax:  [phone]  Name: [name] [name] MRN: [id number] Date of Birth: 08/09/1941

## 2016-07-27 ENCOUNTER — Other Ambulatory Visit: Payer: Self-pay | Admitting: Family Medicine

## 2016-07-27 ENCOUNTER — Ambulatory Visit: Payer: Medicare Other | Admitting: Physical Therapy

## 2016-07-27 DIAGNOSIS — R2689 Other abnormalities of gait and mobility: Secondary | ICD-10-CM | POA: Diagnosis not present

## 2016-07-27 DIAGNOSIS — R29818 Other symptoms and signs involving the nervous system: Secondary | ICD-10-CM | POA: Diagnosis not present

## 2016-07-27 DIAGNOSIS — R6883 Chills (without fever): Secondary | ICD-10-CM

## 2016-07-27 DIAGNOSIS — E7800 Pure hypercholesterolemia, unspecified: Secondary | ICD-10-CM

## 2016-07-27 DIAGNOSIS — M810 Age-related osteoporosis without current pathological fracture: Secondary | ICD-10-CM

## 2016-07-27 DIAGNOSIS — E538 Deficiency of other specified B group vitamins: Secondary | ICD-10-CM

## 2016-07-27 DIAGNOSIS — E039 Hypothyroidism, unspecified: Secondary | ICD-10-CM

## 2016-07-27 DIAGNOSIS — M6281 Muscle weakness (generalized): Secondary | ICD-10-CM | POA: Diagnosis not present

## 2016-07-27 DIAGNOSIS — R2681 Unsteadiness on feet: Secondary | ICD-10-CM | POA: Diagnosis not present

## 2016-07-27 NOTE — Therapy (Signed)
[facility] [address] [place], [place], [address] Phone: [phone]   Fax:  [phone]  Physical Therapy Treatment  Patient Details  Name: [name] [name] MRN: [id number] Date of Birth: [birth year] Referring Provider: Dr. Star Age  Encounter Date: 07/27/2016      PT End of Session - 07/27/16 1904    Visit Number 11  G1   Number of Visits 17   Date for PT Re-Evaluation 08/24/16   Authorization Type UHC MCR   Authorization Time Period 07-24-16 - 09-22-16   PT Start Time 1450   PT Stop Time 1535   PT Time Calculation (min) 45 min   Equipment Utilized During Treatment Gait belt      Past Medical History:  Diagnosis Date  . Asthma   . CAD (coronary artery disease)    myoview 5/08:  EF 76%, no scar, no ischemia, +ECG changes with exercise;  cath 04/08/07:  pLAD 30%, mLAD 60-70% - med tx.  . Chronic cystitis    recurrent UTIs, started bactrim ppx (McDiarmid)  . Depression   . Familial tremor    followed by Dr. Erling Cruz  . GERD (gastroesophageal reflux disease)    s/p nissen  . History of pneumonia   . HLD (hyperlipidemia)   . Hydronephrosis, bilateral   . Hypothyroidism   . Internal hemorrhoids   . 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 Neurogenic bladder    Tannenbaum/MacDiarmid  . Osteoporosis 2016   DEXA T-4.4 spine deteriorated since 2012  . Unspecified chronic bronchitis ([place])   . VAGINITIS, ATROPHIC 11/07/2007    Past Surgical History:  Procedure Laterality Date  . COLONOSCOPY N/A 08/29/2013   int hemm; Inda Castle, MD  . CYSTECTOMY    . DEXA  12/2014   T -4.4 spine  . ESOPHAGOGASTRODUODENOSCOPY N/A 01/09/2015   Procedure: ESOPHAGOGASTRODUODENOSCOPY (EGD);  Surgeon: Inda Castle, MD;  Location: [facility];  Service: Endoscopy;  Laterality: N/A;  help with transfers  . LAPAROSCOPIC NISSEN FUNDOPLICATION    . TUBAL LIGATION    . TUMOR REMOVAL      There were no  vitals filed for this visit.      Subjective Assessment - 07/27/16 1859    Subjective Pt reports her walking has gotten much harder - is unable to amb. 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 at [facility] clinic in Aug. 2013;Pt. had UTI on 04-11-15; went to ED - received catherization; states she has had increased weakness and some mild decline in mobility since this problem   Patient Stated Goals improve walking and mobility   Currently in Pain? No/denies                         [facility] Adult PT Treatment/Exercise - 07/27/16 0001      Transfers   Transfers Sit to Stand   Sit to Stand 3: Mod assist     Ambulation/Gait   Ambulation/Gait Yes   Ambulation/Gait Assistance 3: Mod assist   Ambulation/Gait Assistance Details assistance with weight shifting to fascilitate step initiation   Ambulation Distance (Feet) 20 Feet  20' 2nd rep; 30' 3rd rep    Assistive device Rolling walker   Gait Pattern Wide base of support;Decreased hip/knee flexion -  right;Decreased hip/knee flexion - left;Decreased stride length;Step-through pattern;Abducted- right;Decreased trunk rotation;Decreased step length - right;Decreased step length - left;Festinating   Ambulation Surface Level;Indoor   Gait Comments R foot freezes and supinates due to incr. tone     Knee/Hip Exercises: Aerobic   Stationary Bike Nustep level 1 x 15" with UE's and LE's  no charge as unsupervised                  PT Short Term Goals - 07/22/16 2000      PT SHORT TERM GOAL #1   Title Pt. will increase standing tolerance so she is able to report standing 3" at home with min to CGA for independence with ADL's. (06-25-16)   Baseline varies - functional status varies day to day - at times able to stand for 3" and other times unable - 07-07-16   Time 4    Period Weeks   Status On-going     PT SHORT TERM GOAL #2   Title Amb. with RW 31' with mod assist.  (06-25-16)   Baseline met on 07-07-16 but performance varies   Period Weeks   Status On-going     PT SHORT TERM GOAL #3   Title Transfer sit to stand from wheelchair with mod assist   (06-25-16)   Baseline inconsistently met as performance varies - 07-07-16   Time 4   Period Weeks   Status On-going     PT SHORT TERM GOAL #4   Title Independent in HEP for stretching and strengthening.  (06-25-16)   Baseline met 07-07-16   Status Achieved     PT SHORT TERM GOAL #5   Title Improve TUG score to </= 50 secs with RW with CGA (06-19-15)   Status Achieved           PT Long Term Goals - 07/24/16 1036      PT LONG TERM GOAL #1   Title Pt. will ambulate 49'  nonstop with RW to demo incr. endurance with CGA  (07-25-16)  EXTEND UNMET LTG'S til 08-22-16   Baseline pt amb. 20' with mod assist with RW on 07-23-16   Time 4   Period Weeks   Status On-going  LTG continued as unmet as of 07-23-16     PT LONG TERM GOAL #2   Title Report ability to stand at least 5" with UE support with min assist at home for improved standing tolerance  (07-25-16)/ 08-22-16   Baseline 1"-2" reported - with assist needed due to balance deficits - performance varies due to fluctuations in status   Time 4   Period Weeks   Status On-going     PT LONG TERM GOAL #3   Title Perform sit to stand transfer from mat to RW with +1 mod assist. (07-25-16)/ 08-22-16   Baseline mod to max assist - performance varies   Time 4   Period Weeks   Status On-going     PT LONG TERM GOAL #4   Title Amb. 15' in </= 3" to demo incr. gait speed.  (07-25-16)   Time 4   Period Weeks   Status On-going     PT LONG TERM GOAL #5   Title Independent to direct assistance prn with stretches and exercises for HEP.  (07-25-16)   Status Achieved               Plan - 07/27/16 1906    Clinical Impression Statement Pt having  significant difficulty with amb.  due to frequent freezing episodes of RLE; mobility appears to be decreasing due to progression of MSA   Rehab Potential Good   Clinical Impairments Affecting Rehab Potential severity of deficits and progressive neurological disease   PT Frequency 2x / week   PT Duration 8 weeks   PT Treatment/Interventions ADLs/Self Care Home Management;Therapeutic activities;Patient/family education;Therapeutic exercise;Gait training;Balance training;Stair training;Neuromuscular re-education;Functional mobility training   PT Next Visit Plan  stretches, seated PWR exercises: gait train with RW as able   PT Home Exercise Plan stretches and supine strengthening exs.   Consulted and Agree with Plan of Care Patient      Patient will benefit from skilled therapeutic intervention in order to improve the following deficits and impairments:  Abnormal gait, Decreased coordination, Impaired flexibility, Difficulty walking, Decreased balance, Increased muscle spasms, Impaired tone, Decreased mobility, Decreased strength, Decreased activity tolerance, Decreased range of motion  Visit Diagnosis: Other abnormalities of gait and mobility  Other symptoms and signs involving the nervous system     Problem List Patient Active Problem List   Diagnosis Date Noted  . General weakness 02/26/2016  . Chills 02/26/2016  . Other fatigue 02/04/2016  . Neurogenic bladder   . Chronic cystitis 04/18/2015  . Pedal edema 04/01/2015  . Dyspepsia 01/09/2015  . Constipation 12/14/2014  . Medicare annual wellness visit, subsequent 11/27/2014  . Advanced care planning/counseling discussion 11/27/2014  . Osteoporosis   . Vitamin B12 deficiency 06/07/2014  . Recurrent falls 02/06/2014  . Multiple system atrophy ([place])   . Dysphagia, unspecified(787.20) 07/28/2013  . Shoulder pain, left 07/20/2012  . 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 Routine health maintenance 08/26/2011  . Murmur  05/27/2011  . HYPERCHOLESTEROLEMIA  IIA 05/14/2010  . VENOUS INSUFFICIENCY, LEGS 06/21/2009  . CORONARY HEART DISEASE 06/09/2008  . Hypothyroidism 05/15/2008  . ASTHMA 05/15/2008  . Depression 11/07/2007  . 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 GERD 11/07/2007  . VAGINITIS, ATROPHIC 11/07/2007    Alda Lea, PT 07/27/2016, 7:09 PM  [facility] [address] [place], [place], [address] Phone: [phone]   Fax:  [phone]  Name: [name] [name] MRN: [id number] Date of Birth: 1941/10/14

## 2016-07-28 ENCOUNTER — Ambulatory Visit (INDEPENDENT_AMBULATORY_CARE_PROVIDER_SITE_OTHER): Payer: Medicare Other

## 2016-07-28 ENCOUNTER — Other Ambulatory Visit (INDEPENDENT_AMBULATORY_CARE_PROVIDER_SITE_OTHER): Payer: Medicare Other

## 2016-07-28 VITALS — BP 122/70 | HR 68 | Temp 97.6°F

## 2016-07-28 DIAGNOSIS — R6883 Chills (without fever): Secondary | ICD-10-CM

## 2016-07-28 DIAGNOSIS — E538 Deficiency of other specified B group vitamins: Secondary | ICD-10-CM

## 2016-07-28 DIAGNOSIS — Z0000 Encounter for general adult medical examination without abnormal findings: Secondary | ICD-10-CM

## 2016-07-28 DIAGNOSIS — M810 Age-related osteoporosis without current pathological fracture: Secondary | ICD-10-CM | POA: Diagnosis not present

## 2016-07-28 DIAGNOSIS — E039 Hypothyroidism, unspecified: Secondary | ICD-10-CM | POA: Diagnosis not present

## 2016-07-28 DIAGNOSIS — E7800 Pure hypercholesterolemia, unspecified: Secondary | ICD-10-CM

## 2016-07-28 LAB — VITAMIN B12: Vitamin B-12: 386 pg/mL (ref 211–911)

## 2016-07-28 LAB — LIPID PANEL
Cholesterol: 122 mg/dL (ref 0–200)
HDL: 46.5 mg/dL (ref >39.00)
LDL Cholesterol: 56 mg/dL (ref 0–99)
NONHDL: 75.03
Total CHOL/HDL Ratio: 3
Triglycerides: 96 mg/dL (ref 0.0–149.0)
VLDL: 19.2 mg/dL (ref 0.0–40.0)

## 2016-07-28 LAB — COMPREHENSIVE METABOLIC PANEL
ALBUMIN: 3.8 g/dL (ref 3.5–5.2)
ALK PHOS: 121 U/L — AB (ref 39–117)
ALT: 14 U/L (ref 0–35)
AST: 15 U/L (ref 0–37)
BUN: 13 mg/dL (ref 6–23)
CALCIUM: 9 mg/dL (ref 8.4–10.5)
CO2: 31 mEq/L (ref 19–32)
Chloride: 106 mEq/L (ref 96–112)
Creatinine, Ser: 0.72 mg/dL (ref 0.40–1.20)
GFR: 83.65 mL/min (ref >60.00)
GLUCOSE: 107 mg/dL — AB (ref 70–99)
POTASSIUM: 4 meq/L (ref 3.5–5.1)
SODIUM: 141 meq/L (ref 135–145)
TOTAL PROTEIN: 6.4 g/dL (ref 6.0–8.3)
Total Bilirubin: 0.4 mg/dL (ref 0.2–1.2)

## 2016-07-28 LAB — TSH: TSH: 0.92 u[IU]/mL (ref 0.35–4.50)

## 2016-07-28 LAB — SEDIMENTATION RATE: SED RATE: 9 mm/h (ref 0–30)

## 2016-07-28 LAB — VITAMIN D 25 HYDROXY (VIT D DEFICIENCY, FRACTURES): VITD: 13.04 ng/mL — ABNORMAL LOW (ref 30.00–100.00)

## 2016-07-28 NOTE — Progress Notes (Signed)
PCP notes:   Health maintenance:  No gaps identified. Health maintenance schedule was verbally reviewed with patient.   Abnormal screenings:   None  Patient concerns:   None  Nurse concerns:  None  Next PCP appt:   08/03/16 @ 1600

## 2016-07-28 NOTE — Patient Instructions (Signed)
[name] [name] , Thank you for taking time to come for your Medicare Wellness Visit. I appreciate your ongoing commitment to your health goals. Please review the following plan we discussed and let me know if I can assist you in the future.   These are the goals we discussed: Goals    . water           Starting 07/28/2016, I will continue to drink at least 6-8 glasses of water daily.        This is a list of the screening recommended for you and due dates:  Health Maintenance  Topic Date Due  . DTaP/Tdap/Td vaccine (1 - Tdap) 05/15/2018*  . Tetanus Vaccine  05/15/2018  . Flu Shot  Completed  . DEXA scan (bone density measurement)  Completed  . Shingles Vaccine  Completed  . Pneumonia vaccines  Completed  *Topic was postponed. The date shown is not the original due date.   Preventive Care for Adults  A healthy lifestyle and preventive care can promote health and wellness. Preventive health guidelines for adults include the following key practices.  . A routine yearly physical is a good way to check with your health care provider about your health and preventive screening. It is a chance to share any concerns and updates on your health and to receive a thorough exam.  . Visit your dentist for a routine exam and preventive care every 6 months. Brush your teeth twice a day and floss once a day. Good oral hygiene prevents tooth decay and gum disease.  . The frequency of eye exams is based on your age, health, family medical history, use  of contact lenses, and other factors. Follow your health care provider's ecommendations for frequency of eye exams.  . Eat a healthy diet. Foods like vegetables, fruits, whole grains, low-fat dairy products, and lean protein foods contain the nutrients you need without too many calories. Decrease your intake of foods high in solid fats, added sugars, and salt. Eat the right amount of calories for you. Get information about a proper diet from your health care  provider, if necessary.  . Regular physical exercise is one of the most important things you can do for your health. Most adults should get at least 150 minutes of moderate-intensity exercise (any activity that increases your heart rate and causes you to sweat) each week. In addition, most adults need muscle-strengthening exercises on 2 or more days a week.  Silver Sneakers may be a benefit available to you. To determine eligibility, you may visit the website: www.silversneakers.com or contact program at 510-739-1101 Mon-Fri between 8AM-8PM.   . Maintain a healthy weight. The body mass index (BMI) is a screening tool to identify possible weight problems. It provides an estimate of body fat based on height and weight. Your health care provider can find your BMI and can help you achieve or maintain a healthy weight.   For adults 20 years and older: ? A BMI below 18.5 is considered underweight. ? A BMI of 18.5 to 24.9 is normal. ? A BMI of 25 to 29.9 is considered overweight. ? A BMI of 30 and above is considered obese.   . Maintain normal blood lipids and cholesterol levels by exercising and minimizing your intake of saturated fat. Eat a balanced diet with plenty of fruit and vegetables. Blood tests for lipids and cholesterol should begin at age 67 and be repeated every 5 years. If your lipid or cholesterol levels are high, you  are over 26, or you are at high risk for heart disease, you may need your cholesterol levels checked more frequently. Ongoing high lipid and cholesterol levels should be treated with medicines if diet and exercise are not working.  . If you smoke, find out from your health care provider how to quit. If you do not use tobacco, please do not start.  . If you choose to drink alcohol, please do not consume more than 2 drinks per day. One drink is considered to be 12 ounces (355 mL) of beer, 5 ounces (148 mL) of wine, or 1.5 ounces (44 mL) of liquor.  . If you are 79-79 years  old, ask your health care provider if you should take aspirin to prevent strokes.  . Use sunscreen. Apply sunscreen liberally and repeatedly throughout the day. You should seek shade when your shadow is shorter than you. Protect yourself by wearing long sleeves, pants, a wide-brimmed hat, and sunglasses year round, whenever you are outdoors.  . Once a month, do a whole body skin exam, using a mirror to look at the skin on your back. Tell your health care provider of new moles, moles that have irregular borders, moles that are larger than a pencil eraser, or moles that have changed in shape or color.

## 2016-07-28 NOTE — Progress Notes (Signed)
Pre visit review using our clinic review tool, if applicable. No additional management support is needed unless otherwise documented below in the visit note. 

## 2016-07-28 NOTE — Progress Notes (Signed)
Subjective:   [name] [name] is a 75 y.o. female who presents for Medicare Annual (Subsequent) preventive examination.  Review of Systems:  N/A Cardiac Risk Factors include: advanced age (>44mn, >>17women)     Objective:     Vitals: BP 122/70 (BP Location: Left Arm, Patient Position: Sitting, Cuff Size: Normal)   Pulse 68   Temp 97.6 F (36.4 C) (Oral)   SpO2 96%   There is no height or weight on file to calculate BMI.   Tobacco History  Smoking Status  . Never Smoker  Smokeless Tobacco  . Never Used     Counseling given: No   Past Medical History:  Diagnosis Date  . Asthma   . CAD (coronary artery disease)    myoview 5/08:  EF 76%, no scar, no ischemia, +ECG changes with exercise;  cath 04/08/07:  pLAD 30%, mLAD 60-70% - med tx.  . Chronic cystitis    recurrent UTIs, started bactrim ppx (McDiarmid)  . Depression   . Familial tremor    followed by Dr. LErling Cruz . GERD (gastroesophageal reflux disease)    s/p nissen  . History of pneumonia   . HLD (hyperlipidemia)   . Hydronephrosis, bilateral   . Hypothyroidism   . Internal hemorrhoids   . [name]  . Neurogenic bladder    Tannenbaum/MacDiarmid  . Osteoporosis 2016   DEXA T-4.4 spine deteriorated since 2012  . [place]   . VAGINITIS, ATROPHIC 11/07/2007   Past Surgical History:  Procedure Laterality Date  . COLONOSCOPY N/A 08/29/2013   int hemm; RInda Castle MD  . CYSTECTOMY    . DEXA  12/2014   T -4.4 spine  . ESOPHAGOGASTRODUODENOSCOPY N/A 01/09/2015   Procedure: ESOPHAGOGASTRODUODENOSCOPY (EGD);  Surgeon: RInda Castle MD;  [facility];  Service: Endoscopy;  Laterality: N/A;  help with transfers  . LAPAROSCOPIC NISSEN FUNDOPLICATION    . TUBAL LIGATION    . TUMOR REMOVAL     Family History  Problem Relation Age of Onset  . Hypertension Mother   . Coronary artery disease Father   . Heart attack Father     . Heart disease Father   . Diabetes Daughter   . Breast cancer Maternal Aunt   . Coronary artery disease Daughter   . Colon cancer Maternal Aunt   . Multiple sclerosis Daughter   . Stroke Daughter   . Heart attack Daughter    History  Sexual Activity  . Sexual activity: Not Currently    Outpatient Encounter Prescriptions as of 07/28/2016  Medication Sig  . albuterol (PROAIR HFA) 108 (90 BASE) MCG/ACT inhaler Inhale 2 puffs into the lungs every 6 (six) hours as needed for wheezing or shortness of breath. Reported on 01/22/2016  . aspirin (ASPIRIN EC) 81 MG EC tablet Take 81 mg by mouth 2 (two) times a week. Swallow whole. Take at bedtime  . cephALEXin (KEFLEX) 250 MG capsule Take 250 mg by mouth daily. UTI PPX  . clonazePAM (KLONOPIN) 0.5 MG tablet Take 0.5 mg by mouth 2 (two) times daily as needed (tremors).  . Cranberry (ELLURA) 200 MG CAPS Take 1 capsule by mouth every evening.   .Marland KitchenCRESTOR 20 MG tablet TAKE ONE-HALF TABLET BY MOUTH ONCE EVERY EVENING  . cyanocobalamin (,VITAMIN B-12,) 1000 MCG/ML injection Inject 1,000 mcg into the muscle every 30 (thirty) days. Take the first of the month  . dexlansoprazole (DEXILANT)  60 MG capsule Take 60 mg by mouth daily as needed. For acid reflux  . famotidine (PEPCID) 40 MG tablet TAKE 1 TABLET (40 MG TOTAL) BY MOUTH 2 (TWO) TIMES DAILY AS NEEDED FOR HEARTBURN.  . furosemide (LASIX) 20 MG tablet Take 1 tablet (20 mg total) by mouth daily as needed for fluid or edema.  . hyoscyamine (LEVSIN SL) 0.125 MG SL tablet PLACE 2 TABLETS (0.25 MG TOTAL) UNDER THE TONGUE EVERY 4 (FOUR) HOURS AS NEEDED (SPASMS).  . isosorbide mononitrate (IMDUR) 30 MG 24 hr tablet TAKE 1/2 TABLET BY MOUTH DAILY  . MYRBETRIQ 50 MG TB24 tablet Take 50 mg by mouth daily.  . nitroGLYCERIN (NITROSTAT) 0.4 MG SL tablet Place 1 tablet (0.4 mg total) under the tongue every 5 (five) minutes as needed (chest pain).  . polyethylene glycol powder (GLYCOLAX/MIRALAX) powder Take 17 g by  mouth daily as needed for moderate constipation.  . propranolol (INDERAL) 10 MG tablet TAKE 1 TABLET BY MOUTH 3 TIMES DAILY AS NEEDED (TREMORS).  Marland Kitchen sertraline (ZOLOFT) 25 MG tablet Take 1 tablet (25 mg total) by mouth daily.  Marland Kitchen SYNTHROID 112 MCG tablet TAKE 1 TABLET (112 MCG TOTAL) BY MOUTH DAILY BEFORE BREAKFAST.   No facility-administered encounter medications on file as of 07/28/2016.     Activities of Daily Living In your present state of health, do you have any difficulty performing the following activities: 07/28/2016  Hearing? Y  Vision? Y  Difficulty concentrating or making decisions? N  Walking or climbing stairs? Y  Dressing or bathing? Y  Doing errands, shopping? Y  Preparing Food and eating ? Y  Using the Toilet? Y  In the past six months, have you accidently leaked urine? Y  Do you have problems with loss of bowel control? Y  Managing your Medications? Y  Managing your Finances? Y  Housekeeping or managing your Housekeeping? Y  Some recent data might be hidden    Patient Care Team: Ria Bush, MD as PCP - General (Family Medicine) Carolan Clines, MD (Urology) Rozetta Nunnery, MD (Otolaryngology) Deneise Lever, MD (Pulmonary Disease) Lennon Alstrom, MD (Neurology) Roseanne Kaufman, MD (Orthopedic Surgery) [name], MD as Consulting Physician (Cardiology) Jerene Bears, MD as Consulting Physician (Gastroenterology) Druscilla Brownie, MD as Consulting Physician (Dermatology) Star Age, MD as Attending Physician (Neurology) Clent Jacks, MD as Consulting Physician (Ophthalmology)    Assessment:    Hearing Screening Comments: Wears hearing aids Vision Screening Comments: Unable to complete vision exam; pt did not have glasses. Last vision exam in 2016 with Dr. Katy Fitch  Exercise Activities and Dietary recommendations Current Exercise Habits: Structured exercise class, Type of exercise: Other - see comments (physical therapy), Time (Minutes): 60,  Frequency (Times/Week): 2, Weekly Exercise (Minutes/Week): 120, Exercise limited by: neurologic condition(s)  Goals    . water           Starting 07/28/2016, I will continue to drink at least 6-8 glasses of water daily.       Fall Risk Fall Risk  07/28/2016 04/28/2016 11/27/2014  Falls in the past year? No Yes Yes  Number falls in past yr: - 2 or more 2 or more  Injury with Fall? - No -  Risk Factor Category  - High Fall Risk High Fall Risk  Risk for fall due to : - Impaired balance/gait;Impaired mobility Impaired balance/gait;Impaired mobility  Follow up - Falls prevention discussed -   Depression Screen PHQ 2/9 Scores 07/28/2016 11/27/2014  PHQ - 2 Score  0 0     Cognitive Testing MMSE - Mini Mental State Exam 07/28/2016  Orientation to time 5  Orientation to Place 5  Registration 3  Attention/ Calculation 0  Recall 3  Language- name 2 objects 0  Language- repeat 1  Language- follow 3 step command 3  Language- read & follow direction 0  Write a sentence 0  Copy design 0  Total score 20   PLEASE NOTE: A Mini-Cog screen was completed. Maximum score is 20. A value of 0 denotes this part of [place] MMSE was not completed or the patient failed this part of the Mini-Cog screening.   Mini-Cog Screening Orientation to Time - Max 5 pts Orientation to Place - Max 5 pts Registration - Max 3 pts Recall - Max 3 pts Language Repeat - Max 1 pts Language Follow 3 Step Command - Max 3 pts   Immunization History  Administered Date(s) Administered  . Influenza Split 08/24/2012  . Influenza Whole 09/19/1998, 08/08/2008, 08/15/2009  . Influenza, High Dose Seasonal PF 08/27/2015, 08/27/2015  . Influenza,inj,Quad PF,36+ Mos 07/16/2016  . Influenza-Unspecified 08/09/2013, 08/09/2014  . Pneumococcal Conjugate-13 12/13/2013  . Pneumococcal Polysaccharide-23 09/19/1998, 05/15/2008  . Td 05/15/2008  . Zoster 01/27/2010   Screening Tests Health Maintenance  Topic Date Due  .  DTaP/Tdap/Td (1 - Tdap) 05/15/2018 (Originally 05/16/2008)  . TETANUS/TDAP  05/15/2018  . INFLUENZA VACCINE  Completed  . DEXA SCAN  Completed  . ZOSTAVAX  Completed  . PNA vac Low Risk Adult  Completed      Plan:     I have personally reviewed and addressed the Medicare Annual Wellness questionnaire and have noted the following in the patient's chart:  A. Medical and social history B. Use of alcohol, tobacco or illicit drugs  C. Current medications and supplements D. Functional ability and status E.  Nutritional status F.  Physical activity G. Advance directives H. List of other physicians I.  Hospitalizations, surgeries, and ER visits in previous 12 months J.  [place] to include hearing, vision, cognitive, depression L. Referrals and appointments - none  In addition, I have reviewed and discussed with patient certain preventive protocols, quality metrics, and best practice recommendations. A written personalized care plan for preventive services as well as general preventive health recommendations were provided to patient.  See attached scanned questionnaire for additional information.   Signed,   Lindell Noe, MHA, BS, LPN Health Advisor

## 2016-07-30 ENCOUNTER — Ambulatory Visit: Payer: Medicare Other | Admitting: Physical Therapy

## 2016-07-30 DIAGNOSIS — R2681 Unsteadiness on feet: Secondary | ICD-10-CM | POA: Diagnosis not present

## 2016-07-30 DIAGNOSIS — R29818 Other symptoms and signs involving the nervous system: Secondary | ICD-10-CM | POA: Diagnosis not present

## 2016-07-30 DIAGNOSIS — M6281 Muscle weakness (generalized): Secondary | ICD-10-CM | POA: Diagnosis not present

## 2016-07-30 DIAGNOSIS — R2689 Other abnormalities of gait and mobility: Secondary | ICD-10-CM | POA: Diagnosis not present

## 2016-07-30 NOTE — Progress Notes (Signed)
I reviewed health advisor's note, was available for consultation, and agree with documentation and plan.  

## 2016-07-30 NOTE — Therapy (Signed)
[facility] [address] [place], [place], [address] Phone: [phone]   Fax:  [phone]  Physical Therapy Treatment  Patient Details  Name: [name] [name] MRN: [id number] Date of Birth: 1941-02-13 Referring Provider: Dr. Star Age  Encounter Date: 07/30/2016      PT End of Session - 07/30/16 1923    Visit Number 12   Number of Visits 17   Date for PT Re-Evaluation 08/24/16   Authorization Type UHC MCR   Authorization Time Period 07-24-16 - 09-22-16   PT Start Time 1450   PT Stop Time 1535   PT Time Calculation (min) 45 min   Equipment Utilized During Treatment Gait belt      Past Medical History:  Diagnosis Date  . Asthma   . CAD (coronary artery disease)    myoview 5/08:  EF 76%, no scar, no ischemia, +ECG changes with exercise;  cath 04/08/07:  pLAD 30%, mLAD 60-70% - med tx.  . Chronic cystitis    recurrent UTIs, started bactrim ppx (McDiarmid)  . Depression   . Familial tremor    followed by Dr. Erling Cruz  . GERD (gastroesophageal reflux disease)    s/p nissen  . History of pneumonia   . HLD (hyperlipidemia)   . Hydronephrosis, bilateral   . Hypothyroidism   . Internal hemorrhoids   . 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 Neurogenic bladder    Tannenbaum/MacDiarmid  . Osteoporosis 2016   DEXA T-4.4 spine deteriorated since 2012  . Unspecified chronic bronchitis ([place])   . VAGINITIS, ATROPHIC 11/07/2007    Past Surgical History:  Procedure Laterality Date  . COLONOSCOPY N/A 08/29/2013   int hemm; Inda Castle, MD  . CYSTECTOMY    . DEXA  12/2014   T -4.4 spine  . ESOPHAGOGASTRODUODENOSCOPY N/A 01/09/2015   Procedure: ESOPHAGOGASTRODUODENOSCOPY (EGD);  Surgeon: Inda Castle, MD;  Location: [facility];  Service: Endoscopy;  Laterality: N/A;  help with transfers  . LAPAROSCOPIC NISSEN FUNDOPLICATION    . TUBAL LIGATION    . TUMOR REMOVAL      There were no vitals  filed for this visit.      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 at [facility] clinic in Aug. 2013;Pt. had UTI on 04-11-15; went to ED - received catherization; states she has had increased weakness and some mild decline in mobility since this problem   Patient Stated Goals improve walking and mobility   Currently in Pain? No/denies                         [facility] Adult PT Treatment/Exercise - 07/30/16 0001      Transfers   Transfers Sit to Stand   Sit to Stand 3: Mod assist   Stand to Sit 3: Mod assist   Comments used swivel disc to transfer wheelchair to [employer]     Ambulation/Gait   Ambulation/Gait Yes   Ambulation/Gait Assistance 3: Mod assist   Ambulation/Gait Assistance Details mod assist to shift weight anteriorly and with weight shifting for step initiation   Ambulation Distance (Feet) 14 Feet  10', 14', 30'   Assistive device Rolling walker  Gait Pattern Wide base of support;Decreased hip/knee flexion - right;Decreased hip/knee flexion - left;Decreased stride length;Step-through pattern;Abducted- right;Decreased trunk rotation;Decreased step length - right;Decreased step length - left;Festinating   Ambulation Surface Level;Indoor   Gait Comments R foot freezes and supinates due to incr. tone     Knee/Hip Exercises: Aerobic   Stationary Bike Nustep level 1 x 15" with UE's and LE's  no charge as unsupervised                  PT Short Term Goals - 07/22/16 2000      PT SHORT TERM GOAL #1   Title Pt. will increase standing tolerance so she is able to report standing 3" at home with min to CGA for independence with ADL's. (06-25-16)   Baseline varies - functional status varies day to day - at times able to stand  for 3" and other times unable - 07-07-16   Time 4   Period Weeks   Status On-going     PT SHORT TERM GOAL #2   Title Amb. with RW 88' with mod assist.  (06-25-16)   Baseline met on 07-07-16 but performance varies   Period Weeks   Status On-going     PT SHORT TERM GOAL #3   Title Transfer sit to stand from wheelchair with mod assist   (06-25-16)   Baseline inconsistently met as performance varies - 07-07-16   Time 4   Period Weeks   Status On-going     PT SHORT TERM GOAL #4   Title Independent in HEP for stretching and strengthening.  (06-25-16)   Baseline met 07-07-16   Status Achieved     PT SHORT TERM GOAL #5   Title Improve TUG score to </= 50 secs with RW with CGA (06-19-15)   Status Achieved           PT Long Term Goals - 07/24/16 1036      PT LONG TERM GOAL #1   Title Pt. will ambulate 37'  nonstop with RW to demo incr. endurance with CGA  (07-25-16)  EXTEND UNMET LTG'S til 08-22-16   Baseline pt amb. 73' with mod assist with RW on 07-23-16   Time 4   Period Weeks   Status On-going  LTG continued as unmet as of 07-23-16     PT LONG TERM GOAL #2   Title Report ability to stand at least 5" with UE support with min assist at home for improved standing tolerance  (07-25-16)/ 08-22-16   Baseline 1"-2" reported - with assist needed due to balance deficits - performance varies due to fluctuations in status   Time 4   Period Weeks   Status On-going     PT LONG TERM GOAL #3   Title Perform sit to stand transfer from mat to RW with +1 mod assist. (07-25-16)/ 08-22-16   Baseline mod to max assist - performance varies   Time 4   Period Weeks   Status On-going     PT LONG TERM GOAL #4   Title Amb. 15' in </= 3" to demo incr. gait speed.  (07-25-16)   Time 4   Period Weeks   Status On-going     PT LONG TERM GOAL #5   Title Independent to direct assistance prn with stretches and exercises for HEP.  (07-25-16)   Status Achieved               Plan - 07/30/16 1924  Clinical Impression Statement Pt had more difficulty with ambulation today than she had on Mon., 9-18 with more supination of R foot noted with pt leaning more posteriorly and having more difficulty with step initiation after freezing episodes   Rehab Potential Good   Clinical Impairments Affecting Rehab Potential severity of deficits and progressive neurological disease   PT Frequency 2x / week   PT Duration 8 weeks   PT Treatment/Interventions ADLs/Self Care Home Management;Therapeutic activities;Patient/family education;Therapeutic exercise;Gait training;Balance training;Stair training;Neuromuscular re-education;Functional mobility training   PT Next Visit Plan  stretches, seated PWR exercises: gait train with RW as able   PT Home Exercise Plan stretches and supine strengthening exs.   Consulted and Agree with Plan of Care Patient      Patient will benefit from skilled therapeutic intervention in order to improve the following deficits and impairments:  Abnormal gait, Decreased coordination, Impaired flexibility, Difficulty walking, Decreased balance, Increased muscle spasms, Impaired tone, Decreased mobility, Decreased strength, Decreased activity tolerance, Decreased range of motion  Visit Diagnosis: Other abnormalities of gait and mobility  Unsteadiness on feet  Other symptoms and signs involving the nervous system     Problem List Patient Active Problem List   Diagnosis Date Noted  . General weakness 02/26/2016  . Chills 02/26/2016  . Other fatigue 02/04/2016  . Neurogenic bladder   . Chronic cystitis 04/18/2015  . Pedal edema 04/01/2015  . Dyspepsia 01/09/2015  . Constipation 12/14/2014  . Medicare annual wellness visit, subsequent 11/27/2014  . Advanced care planning/counseling discussion 11/27/2014  . Osteoporosis   . Vitamin B12 deficiency 06/07/2014  . Recurrent falls 02/06/2014  . Multiple system atrophy ([place])   . Dysphagia, unspecified(787.20) 07/28/2013  .  Shoulder pain, left 07/20/2012  . 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 Routine health maintenance 08/26/2011  . Murmur 05/27/2011  . HYPERCHOLESTEROLEMIA  IIA 05/14/2010  . VENOUS INSUFFICIENCY, LEGS 06/21/2009  . CORONARY HEART DISEASE 06/09/2008  . Hypothyroidism 05/15/2008  . ASTHMA 05/15/2008  . Depression 11/07/2007  . 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 GERD 11/07/2007  . VAGINITIS, ATROPHIC 11/07/2007    Alda Lea, PT 07/30/2016, 7:29 PM  [facility] [address] [place], [place], [address] Phone: [phone]   Fax:  [phone]  Name: [name] [name] MRN: [id number] Date of Birth: 06/23/1941

## 2016-08-03 ENCOUNTER — Ambulatory Visit (INDEPENDENT_AMBULATORY_CARE_PROVIDER_SITE_OTHER): Payer: Medicare Other | Admitting: Family Medicine

## 2016-08-03 ENCOUNTER — Encounter: Payer: Self-pay | Admitting: Family Medicine

## 2016-08-03 VITALS — BP 132/78 | HR 72 | Temp 97.6°F | Wt 155.0 lb

## 2016-08-03 DIAGNOSIS — F329 Major depressive disorder, single episode, unspecified: Secondary | ICD-10-CM | POA: Diagnosis not present

## 2016-08-03 DIAGNOSIS — Z0000 Encounter for general adult medical examination without abnormal findings: Secondary | ICD-10-CM

## 2016-08-03 DIAGNOSIS — G903 Multi-system degeneration of the autonomic nervous system: Secondary | ICD-10-CM

## 2016-08-03 DIAGNOSIS — R1310 Dysphagia, unspecified: Secondary | ICD-10-CM

## 2016-08-03 DIAGNOSIS — F32A Depression, unspecified: Secondary | ICD-10-CM

## 2016-08-03 DIAGNOSIS — E039 Hypothyroidism, unspecified: Secondary | ICD-10-CM | POA: Diagnosis not present

## 2016-08-03 DIAGNOSIS — N3020 Other chronic cystitis without hematuria: Secondary | ICD-10-CM

## 2016-08-03 DIAGNOSIS — E7800 Pure hypercholesterolemia, unspecified: Secondary | ICD-10-CM

## 2016-08-03 DIAGNOSIS — G239 Degenerative disease of basal ganglia, unspecified: Secondary | ICD-10-CM

## 2016-08-03 DIAGNOSIS — Z7189 Other specified counseling: Secondary | ICD-10-CM

## 2016-08-03 DIAGNOSIS — E538 Deficiency of other specified B group vitamins: Secondary | ICD-10-CM

## 2016-08-03 DIAGNOSIS — I2510 Atherosclerotic heart disease of native coronary artery without angina pectoris: Secondary | ICD-10-CM | POA: Diagnosis not present

## 2016-08-03 DIAGNOSIS — M810 Age-related osteoporosis without current pathological fracture: Secondary | ICD-10-CM

## 2016-08-03 DIAGNOSIS — N319 Neuromuscular dysfunction of bladder, unspecified: Secondary | ICD-10-CM

## 2016-08-03 MED ORDER — VITAMIN D3 1.25 MG (50000 UT) PO TABS: 1.0000 | ORAL_TABLET | ORAL | 1 refills | Status: DC

## 2016-08-03 NOTE — Assessment & Plan Note (Addendum)
Chronic, progression noted. Followed by neurology. Continues outpatient physical therapy with limited benefit.

## 2016-08-03 NOTE — Assessment & Plan Note (Signed)
Asxs. Continue aspirin a few times weekly and statin.

## 2016-08-03 NOTE — Patient Instructions (Addendum)
You do have worsening of osteoporosis based on last bone density scan.  Make sure to get as much calcium in your diet as you can (milk, yogurt, cheese). Non dairy foods that contain calcium: Kale, oranges, sardines, oatmeal, soy milk/soybeans, salmon, white beans, dried figs, turnip greens, almonds, broccoli, tofu. I will send in a weekly vitamin D 50,000 unit dose for the next several months.  We will try to set you up for boniva shots at short stay. Let dentist know that we're starting boniva. Watch for hip pain or tooth pain or let us know if any planned dental work.  Bring me copy of your living will to update your chart.  Ok to receive B12 shots here if ok by Dr Rexene Alberts.  Return in 3 months for as needed for follow up visit.   Osteoporosis Osteoporosis is the thinning and loss of density in the bones. Osteoporosis makes the bones more brittle, fragile, and likely to break (fracture). Over time, osteoporosis can cause the bones to become so weak that they fracture after a simple fall. The bones most likely to fracture are the bones in the hip, wrist, and spine. CAUSES  The exact cause is not known. RISK FACTORS Anyone can develop osteoporosis. You may be at greater risk if you have a family history of the condition or have poor nutrition. You may also have a higher risk if you are:   Female.   23 years old or older.  A smoker.  Not physically active.   White or Asian.  Slender. SIGNS AND SYMPTOMS  A fracture might be the first sign of the disease, especially if it results from a fall or injury that would not usually cause a bone to break. Other signs and symptoms include:   Low back and neck pain.  Stooped posture.  Height loss. DIAGNOSIS  To make a diagnosis, your health care provider may:  Take a medical history.  Perform a physical exam.  Order tests, such as:  A bone mineral density test.  A dual-energy X-ray absorptiometry test. TREATMENT  The goal of  osteoporosis treatment is to strengthen your bones to reduce your risk of a fracture. Treatment may involve:  Making lifestyle changes, such as:  Eating a diet rich in calcium.  Doing weight-bearing and muscle-strengthening exercises.  Stopping tobacco use.  Limiting alcohol intake.  Taking medicine to slow the process of bone loss or to increase bone density.  Monitoring your levels of calcium and vitamin D. HOME CARE INSTRUCTIONS  Include calcium and vitamin D in your diet. Calcium is important for bone health, and vitamin D helps the body absorb calcium.  Perform weight-bearing and muscle-strengthening exercises as directed by your health care provider.  Do not use any tobacco products, including cigarettes, chewing tobacco, and electronic cigarettes. If you need help quitting, ask your health care provider.  Limit your alcohol intake.  Take medicines only as directed by your health care provider.  Keep all follow-up visits as directed by your health care provider. This is important.  Take precautions at home to lower your risk of falling, such as:  Keeping rooms well lit and clutter free.  Installing safety rails on stairs.  Using rubber mats in the bathroom and other areas that are often wet or slippery. SEEK IMMEDIATE MEDICAL CARE IF:  You fall or injure yourself.    This information is not intended to replace advice given to you by your health care provider. Make sure you discuss any  questions you have with your health care provider.   Document Released: 08/05/2005 Document Revised: 11/16/2014 Document Reviewed: 04/05/2014 Elsevier Interactive Patient Education [employer].

## 2016-08-03 NOTE — Assessment & Plan Note (Signed)
Pt denies active trouble with this.

## 2016-08-03 NOTE — Assessment & Plan Note (Signed)
Stable period on low dose sertraline

## 2016-08-03 NOTE — Assessment & Plan Note (Signed)
Chronic, stable. Continue crestor 96m 1/2 tab daily. Personal hx CAD, therefore aggressive goal of LDL <70.

## 2016-08-03 NOTE — Assessment & Plan Note (Addendum)
Reviewed latest dexa 2016 - showing progression of osteoporosis.  H/o GERD s/p nissen, MSA with possible dysphagia preclude oral therapy. Will schedule iV boniva Q3 months, husband requests PM appt.  Low vitamin D --> Rx 50,000 IU weekly x 3-6 months.

## 2016-08-03 NOTE — Assessment & Plan Note (Signed)
Continue f/u with uro. Considering PTNS

## 2016-08-03 NOTE — Progress Notes (Addendum)
BP 132/78 (BP Location: Right Arm, Cuff Size: Normal)   Pulse 72   Temp 97.6 F (36.4 C) (Oral)   Wt 155 lb (70.3 kg) Comment: per patient  BMI 27.90 kg/m    CC: CPE Subjective:    Patient ID: [name] [name], female    DOB: 1941-08-03, 75 y.o.   MRN: [id number]  HPI: [name] [name] is a 75 y.o. female presenting on 08/03/2016 for Annual Exam   Saw Katha Cabal last week for medicare wellness visit. Note reviewed. Vision screen updated today.   Presents with husband [name]. Had asthma attack this morning, doing better. Cut leg on wheelchair this afternoon.   Known MSA previously thought worsening chills a manifestation of temperature dysregulation from autonomic instability. Saw neurology Dr Rexene Alberts 04/2016 - suggested trial baclofen for muscle spasms and warm beverages and blanket for chills. Baclofen didn't really help.   Preventative: COLONOSCOPY Laterality: N/A Date: 08/29/2013 int hemm; Inda Castle, MD Well woman - last pelvic exam was 5 yrs ago. Stopped pap smears 5 yrs ago, overall normal paps in past Mammo 02/2014 - Birads1. Due for rpt. Wouldn't be able to stand up for mammogram. She does regular breast exams at home.  DEXA 2012 - osteoporosis T-2.7 hip, -3.6 spine. Rpt 2016: DEXA T-4.4 spine deteriorated since 2012.  Flu shot yearly prevnar 12/2013, pneumovax 1999, 2009 Td 2009 Zostavax 2011 Advanced planning - has this at home. Will bring me a copy. Husband is HCPOA.  Seat belt use discussed Sunscreen use discussed. No changing moles on skin. Non smoker No alcohol ues  HSG, retired from [employer] . married '60. 1 son - '65; 1 daughter - '61; 2 grandchildren; 1 great-grandchild. Homemaker. marriage in good health. 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 Positive history of passive tobacco smoke exposure. Caffeine Use: 1 cup daily; 2 glasses of tea daily  Relevant past medical, surgical, family and social history reviewed and updated as indicated. Interim medical history since our  last visit reviewed. Allergies and medications reviewed and updated. Current Outpatient Prescriptions on File Prior to Visit  Medication Sig  . albuterol (PROAIR HFA) 108 (90 BASE) MCG/ACT inhaler Inhale 2 puffs into the lungs every 6 (six) hours as needed for wheezing or shortness of breath. Reported on 01/22/2016  . aspirin (ASPIRIN EC) 81 MG EC tablet Take 81 mg by mouth 2 (two) times a week. Swallow whole. Take at bedtime  . cephALEXin (KEFLEX) 250 MG capsule Take 250 mg by mouth daily. UTI PPX  . clonazePAM (KLONOPIN) 0.5 MG tablet Take 0.5 mg by mouth 2 (two) times daily as needed (tremors).  . Cranberry (ELLURA) 200 MG CAPS Take 1 capsule by mouth every evening.   Marland Kitchen CRESTOR 20 MG tablet TAKE ONE-HALF TABLET BY MOUTH ONCE EVERY EVENING  . cyanocobalamin (,VITAMIN B-12,) 1000 MCG/ML injection Inject 1,000 mcg into the muscle every 30 (thirty) days. Take the first of the month  . dexlansoprazole (DEXILANT) 60 MG capsule Take 60 mg by mouth daily as needed. For acid reflux  . famotidine (PEPCID) 40 MG tablet TAKE 1 TABLET (40 MG TOTAL) BY MOUTH 2 (TWO) TIMES DAILY AS NEEDED FOR HEARTBURN.  . furosemide (LASIX) 20 MG tablet Take 1 tablet (20 mg total) by mouth daily as needed for fluid or edema.  . hyoscyamine (LEVSIN SL) 0.125 MG SL tablet PLACE 2 TABLETS (0.25 MG TOTAL) UNDER THE TONGUE EVERY 4 (FOUR) HOURS AS NEEDED (SPASMS).  . isosorbide mononitrate (IMDUR) 30 MG 24 hr tablet TAKE  1/2 TABLET BY MOUTH DAILY  . MYRBETRIQ 50 MG TB24 tablet Take 50 mg by mouth daily.  . nitroGLYCERIN (NITROSTAT) 0.4 MG SL tablet Place 1 tablet (0.4 mg total) under the tongue every 5 (five) minutes as needed (chest pain).  . polyethylene glycol powder (GLYCOLAX/MIRALAX) powder Take 17 g by mouth daily as needed for moderate constipation.  . sertraline (ZOLOFT) 25 MG tablet Take 1 tablet (25 mg total) by mouth daily.  Marland Kitchen SYNTHROID 112 MCG tablet TAKE 1 TABLET (112 MCG TOTAL) BY MOUTH DAILY BEFORE BREAKFAST.   No  current facility-administered medications on file prior to visit.     Review of Systems  Constitutional: Positive for chills. Negative for activity change, appetite change, fatigue, fever and unexpected weight change.  HENT: Negative for hearing loss.   Eyes: Negative for visual disturbance.  Respiratory: Negative for cough, chest tightness, shortness of breath and wheezing.   Cardiovascular: Negative for chest pain, palpitations and leg swelling.  Gastrointestinal: Positive for abdominal pain. Negative for abdominal distention, blood in stool, constipation, diarrhea, nausea and vomiting.  Genitourinary: Negative for difficulty urinating and hematuria.  Musculoskeletal: Negative for arthralgias, myalgias and neck pain.  Skin: Negative for rash.  Neurological: Negative for dizziness, seizures, syncope and headaches.  Hematological: Negative for adenopathy. Does not bruise/bleed easily.  Psychiatric/Behavioral: Negative for dysphoric mood. The patient is not nervous/anxious.    Per HPI unless specifically indicated in ROS section     Objective:    BP 132/78 (BP Location: Right Arm, Cuff Size: Normal)   Pulse 72   Temp 97.6 F (36.4 C) (Oral)   Wt 155 lb (70.3 kg) Comment: per patient  BMI 27.90 kg/m   Wt Readings from Last 3 Encounters:  08/17/16 150 lb (68 kg)  08/03/16 155 lb (70.3 kg)  03/17/16 145 lb (65.8 kg)    Physical Exam  Constitutional: She is oriented to person, place, and time. She appears well-developed and well-nourished. No distress.  In wheelchair  HENT:  Head: Normocephalic and atraumatic.  Right Ear: Hearing, tympanic membrane and ear canal normal.  Left Ear: Hearing, tympanic membrane and ear canal normal.  Mouth/Throat: Uvula is midline, oropharynx is clear and moist and mucous membranes are normal. No oropharyngeal exudate, posterior oropharyngeal edema or posterior oropharyngeal erythema.  Hearing aides in place  Eyes: Conjunctivae and EOM are normal.  Pupils are equal, round, and reactive to light. No scleral icterus.  Neck: Normal range of motion. Neck supple. No thyromegaly present.  Cardiovascular: Normal rate, regular rhythm, normal heart sounds and intact distal pulses.   No murmur heard. Pulses:      Radial pulses are 2+ on the right side, and 2+ on the left side.  Pulmonary/Chest: Effort normal and breath sounds normal. No respiratory distress. She has no wheezes. She has no rales.  Abdominal: Soft. Bowel sounds are normal. She exhibits no distension and no mass. There is no tenderness. There is no rebound and no guarding.  Musculoskeletal: Normal range of motion. She exhibits edema (1+ pitting bilaterally).  R lateral leg abrasion with bandaid in place 3/5 strength BUE, BLE  Lymphadenopathy:    She has no cervical adenopathy.  Neurological: She is alert and oriented to person, place, and time.  CN grossly intact Unable to ambulate or stand due to progressive weakness from MSA  Skin: Skin is warm and dry. No rash noted.  Psychiatric: She has a normal mood and affect. Her behavior is normal. Judgment and thought content normal.  Nursing note and vitals reviewed.  Results for orders placed or performed in visit on 07/28/16  Comprehensive metabolic panel  Result Value Ref Range   Sodium 141 135 - 145 mEq/L   Potassium 4.0 3.5 - 5.1 mEq/L   Chloride 106 96 - 112 mEq/L   CO2 31 19 - 32 mEq/L   Glucose, Bld 107 (H) 70 - 99 mg/dL   BUN 13 6 - 23 mg/dL   Creatinine, Ser 0.72 0.40 - 1.20 mg/dL   Total Bilirubin 0.4 0.2 - 1.2 mg/dL   Alkaline Phosphatase 121 (H) 39 - 117 U/L   AST 15 0 - 37 U/L   ALT 14 0 - 35 U/L   Total Protein 6.4 6.0 - 8.3 g/dL   Albumin 3.8 3.5 - 5.2 g/dL   Calcium 9.0 8.4 - 10.5 mg/dL   GFR 83.65 >60.00 mL/min  Lipid panel  Result Value Ref Range   Cholesterol 122 0 - 200 mg/dL   Triglycerides 96.0 0.0 - 149.0 mg/dL   HDL 46.50 >39.00 mg/dL   VLDL 19.2 0.0 - 40.0 mg/dL   LDL Cholesterol 56 0 - 99  mg/dL   Total CHOL/HDL Ratio 3    NonHDL 75.03   TSH  Result Value Ref Range   TSH 0.92 0.35 - 4.50 uIU/mL  Vitamin B12  Result Value Ref Range   Vitamin B-12 386 211 - 911 pg/mL  VITAMIN D 25 Hydroxy (Vit-D Deficiency, Fractures)  Result Value Ref Range   VITD 13.04 (L) 30.00 - 100.00 ng/mL  Sedimentation rate  Result Value Ref Range   Sed Rate 9 0 - 30 mm/hr      Assessment & Plan:   Problem List Items Addressed This Visit    Advanced care planning/counseling discussion    Advanced planning - has at home. Will bring me a copy. Husband is HCPOA.       CAD (coronary artery disease)    Asxs. Continue aspirin a few times weekly and statin.      Chronic cystitis    Appreciate uro care. On keflex 223m daily ppx, myrbetriq 5108mdaily.      Depression    Stable period on low dose sertraline       Dysphagia    Pt denies active trouble with this.      Health maintenance examination - Primary    Preventative protocols reviewed and updated unless pt declined. Discussed healthy diet and lifestyle.       HYPERCHOLESTEROLEMIA  IIA    Chronic, stable. Continue crestor 2042m/2 tab daily. Personal hx CAD, therefore aggressive goal of LDL <70.       Hypothyroidism    Chronic, stable. Continue current regimen.      Multiple system atrophy ([facility])    Chronic, progression noted. Followed by neurology. Continues outpatient physical therapy with limited benefit.      Neurogenic bladder    Continue f/u with uro. Considering PTNS      Osteoporosis    Reviewed latest dexa 2016 - showing progression of osteoporosis.  H/o GERD s/p nissen, MSA with possible dysphagia preclude oral therapy. Will schedule iV boniva Q3 months, husband requests PM appt.  Low vitamin D --> Rx 50,000 IU weekly x 3-6 months.       Relevant Medications   Cholecalciferol (VITAMIN D3) 50000 units TABS   Vitamin B12 deficiency    Continues b12 at neurology office, asks about transitioning to our  office as closer to home. Ok by  [place] if ok by neurology.         Mobility evaluation: Patient requests evaluation for power mobility device. Patient with diagnosis of multiple system atrophy with progressive diffuse generalized weakness limiting ambulation (unable to use cane or walker, lacks UE strength to use manual wheelchair or handlebar steering control of scooter). Needs power mobility for ADLs including transport to use bathroom and throughout house to kitchen for meals and to bedroom to groom/dress/sleep.   Follow up plan: Return in about 3 months (around 11/02/2016), or as needed, for follow up visit.  Ria Bush, MD

## 2016-08-03 NOTE — Assessment & Plan Note (Signed)
Chronic, stable. Continue current regimen. 

## 2016-08-03 NOTE — Assessment & Plan Note (Signed)
Appreciate uro care. On keflex 258m daily ppx, myrbetriq 532mdaily.

## 2016-08-03 NOTE — Assessment & Plan Note (Signed)
Advanced planning - has at home. Will bring me a copy. Husband is HCPOA.

## 2016-08-03 NOTE — Assessment & Plan Note (Addendum)
Continues b12 at neurology office, asks about transitioning to our office as closer to home. Ok by [place] if ok by neurology.

## 2016-08-03 NOTE — Assessment & Plan Note (Signed)
Preventative protocols reviewed and updated unless pt declined. Discussed healthy diet and lifestyle.  

## 2016-08-04 ENCOUNTER — Ambulatory Visit: Payer: Medicare Other | Admitting: Physical Therapy

## 2016-08-04 DIAGNOSIS — R2689 Other abnormalities of gait and mobility: Secondary | ICD-10-CM | POA: Diagnosis not present

## 2016-08-04 DIAGNOSIS — R2681 Unsteadiness on feet: Secondary | ICD-10-CM | POA: Diagnosis not present

## 2016-08-04 DIAGNOSIS — M6281 Muscle weakness (generalized): Secondary | ICD-10-CM

## 2016-08-04 DIAGNOSIS — R29818 Other symptoms and signs involving the nervous system: Secondary | ICD-10-CM | POA: Diagnosis not present

## 2016-08-04 NOTE — Therapy (Signed)
[facility] [address] [place], [place], [address] Phone: [phone]   Fax:  [phone]  Physical Therapy Treatment  Patient Details  Name: [name] [name] MRN: [id number] Date of Birth: 04-22-1941 Referring Provider: Dr. Star Age  Encounter Date: 08/04/2016      PT End of Session - 08/05/16 1851    Visit Number 13   Number of Visits 17   Date for PT Re-Evaluation 08/24/16   Authorization Type UHC [facility]   Authorization Time Period 07-24-16 - 09-22-16   PT Start Time 1447   PT Stop Time 1532   PT Time Calculation (min) 45 min      Past Medical History:  Diagnosis Date  . Asthma   . CAD (coronary artery disease)    myoview 5/08:  EF 76%, no scar, no ischemia, +ECG changes with exercise;  cath 04/08/07:  pLAD 30%, mLAD 60-70% - med tx.  . Chronic cystitis    recurrent UTIs, started bactrim ppx (McDiarmid)  . Depression   . Familial tremor    followed by Dr. Erling Cruz  . GERD (gastroesophageal reflux disease)    s/p nissen  . History of pneumonia   . HLD (hyperlipidemia)   . Hydronephrosis, bilateral   . Hypothyroidism   . Internal hemorrhoids   . 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 Neurogenic bladder    Tannenbaum/MacDiarmid  . Osteoporosis 2016   DEXA T-4.4 spine deteriorated since 2012  . Unspecified chronic bronchitis ([place])   . VAGINITIS, ATROPHIC 11/07/2007    Past Surgical History:  Procedure Laterality Date  . COLONOSCOPY N/A 08/29/2013   int hemm; Inda Castle, MD  . CYSTECTOMY    . DEXA  12/2014   T -4.4 spine  . ESOPHAGOGASTRODUODENOSCOPY N/A 01/09/2015   Procedure: ESOPHAGOGASTRODUODENOSCOPY (EGD);  Surgeon: Inda Castle, MD;  Location: [facility];  Service: Endoscopy;  Laterality: N/A;  help with transfers  . LAPAROSCOPIC NISSEN FUNDOPLICATION    . TUBAL LIGATION    . TUMOR REMOVAL      There were no vitals filed for this visit.      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 at [facility] clinic in Aug. 2013;Pt. had UTI on 04-11-15; went to ED - received catherization; states she has had increased weakness and some mild decline in mobility since this problem   Patient Stated Goals improve walking and mobility   Currently in Pain? No/denies        Pt gait trained 30', 67', 70' and 38' with RW - with mod assist; blue shoe cover used to fascilitate step initiation after freezing of R foot  Pt needs mod assist with sit to stand transfers                           PT Short Term Goals - 07/22/16 2000      PT SHORT TERM GOAL #1   Title Pt. will increase standing tolerance so she is able to report standing 3" at home with min to CGA for independence with ADL's. (06-25-16)   Baseline varies - functional status varies day to day - at times able to stand for 3"  and other times unable - 07-07-16   Time 4   Period Weeks   Status On-going     PT SHORT TERM GOAL #2   Title Amb. with RW 78' with mod assist.  (06-25-16)   Baseline met on 07-07-16 but performance varies   Period Weeks   Status On-going     PT SHORT TERM GOAL #3   Title Transfer sit to stand from wheelchair with mod assist   (06-25-16)   Baseline inconsistently met as performance varies - 07-07-16   Time 4   Period Weeks   Status On-going     PT SHORT TERM GOAL #4   Title Independent in HEP for stretching and strengthening.  (06-25-16)   Baseline met 07-07-16   Status Achieved     PT SHORT TERM GOAL #5   Title Improve TUG score to </= 50 secs with RW with CGA (06-19-15)   Status Achieved           PT Long Term Goals - 07/24/16 1036      PT LONG TERM GOAL #1   Title Pt. will ambulate 72'  nonstop with RW to demo incr. endurance with CGA   (07-25-16)  EXTEND UNMET LTG'S til 08-22-16   Baseline pt amb. 71' with mod assist with RW on 07-23-16   Time 4   Period Weeks   Status On-going  LTG continued as unmet as of 07-23-16     PT LONG TERM GOAL #2   Title Report ability to stand at least 5" with UE support with min assist at home for improved standing tolerance  (07-25-16)/ 08-22-16   Baseline 1"-2" reported - with assist needed due to balance deficits - performance varies due to fluctuations in status   Time 4   Period Weeks   Status On-going     PT LONG TERM GOAL #3   Title Perform sit to stand transfer from mat to RW with +1 mod assist. (07-25-16)/ 08-22-16   Baseline mod to max assist - performance varies   Time 4   Period Weeks   Status On-going     PT LONG TERM GOAL #4   Title Amb. 15' in </= 3" to demo incr. gait speed.  (07-25-16)   Time 4   Period Weeks   Status On-going     PT LONG TERM GOAL #5   Title Independent to direct assistance prn with stretches and exercises for HEP.  (07-25-16)   Status Achieved               Plan - 08/05/16 1851    Clinical Impression Statement Pt improved with ambulation today - blue shoe cover used on R shoe to decrease friction/sticking to floor with freezing episodes - gait distance improved - pt cont to need mod to max assist with weight shift   Rehab Potential Good   Clinical Impairments Affecting Rehab Potential severity of deficits and progressive neurological disease   PT Frequency 2x / week   PT Duration 8 weeks   PT Treatment/Interventions ADLs/Self Care Home Management;Therapeutic activities;Patient/family education;Therapeutic exercise;Gait training;Balance training;Stair training;Neuromuscular re-education;Functional mobility training   PT Next Visit Plan  stretches, seated PWR exercises: gait train with RW as able   PT Home Exercise Plan stretches and supine strengthening exs.   Consulted and Agree with Plan of Care Patient      Patient will benefit from  skilled therapeutic intervention in order to improve the following deficits and impairments:  Abnormal gait,  Decreased coordination, Impaired flexibility, Difficulty walking, Decreased balance, Increased muscle spasms, Impaired tone, Decreased mobility, Decreased strength, Decreased activity tolerance, Decreased range of motion  Visit Diagnosis: Other abnormalities of gait and mobility  Muscle weakness (generalized)     Problem List Patient Active Problem List   Diagnosis Date Noted  . General weakness 02/26/2016  . Chills 02/26/2016  . Other fatigue 02/04/2016  . Neurogenic bladder   . Chronic cystitis 04/18/2015  . Pedal edema 04/01/2015  . Constipation 12/14/2014  . Medicare annual wellness visit, subsequent 11/27/2014  . Advanced care planning/counseling discussion 11/27/2014  . Osteoporosis   . Vitamin B12 deficiency 06/07/2014  . Recurrent falls 02/06/2014  . Multiple system atrophy ([place])   . Dysphagia 07/28/2013  . Shoulder pain, left 07/20/2012  . 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 Health maintenance examination 08/26/2011  . Murmur 05/27/2011  . HYPERCHOLESTEROLEMIA  IIA 05/14/2010  . VENOUS INSUFFICIENCY, LEGS 06/21/2009  . CAD (coronary artery disease) 06/09/2008  . Hypothyroidism 05/15/2008  . ASTHMA 05/15/2008  . Depression 11/07/2007  . 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 GERD 11/07/2007  . VAGINITIS, ATROPHIC 11/07/2007    Alda Lea, PT 08/05/2016, 6:55 PM  [facility] [address] [place], [place], [address] Phone: [phone]   Fax:  [phone]  Name: CODY OLIGER MRN: [id number] Date of Birth: [birth year]

## 2016-08-05 ENCOUNTER — Telehealth: Payer: Self-pay | Admitting: *Deleted

## 2016-08-05 NOTE — Telephone Encounter (Signed)
Order for Boniva in your IN box for completion. Please return to me when complete. Thanks!

## 2016-08-06 ENCOUNTER — Ambulatory Visit: Payer: Medicare Other | Admitting: Physical Therapy

## 2016-08-06 DIAGNOSIS — R2681 Unsteadiness on feet: Secondary | ICD-10-CM | POA: Diagnosis not present

## 2016-08-06 DIAGNOSIS — R2689 Other abnormalities of gait and mobility: Secondary | ICD-10-CM | POA: Diagnosis not present

## 2016-08-06 DIAGNOSIS — M6281 Muscle weakness (generalized): Secondary | ICD-10-CM

## 2016-08-06 DIAGNOSIS — R29818 Other symptoms and signs involving the nervous system: Secondary | ICD-10-CM | POA: Diagnosis not present

## 2016-08-06 NOTE — Telephone Encounter (Signed)
Signed and in Kim's box.

## 2016-08-06 NOTE — Telephone Encounter (Signed)
Order faxed to short stay. Patient notified and will call to schedule appt.

## 2016-08-06 NOTE — Therapy (Signed)
[facility] [address] [place], [place], [address] Phone: [phone]   Fax:  [phone]  Physical Therapy Treatment  Patient Details  Name: [name] [name] MRN: [id number] Date of Birth: 1941/05/21 Referring Provider: Dr. Star Age  Encounter Date: 08/06/2016      PT End of Session - 08/07/16 1040    Visit Number 14   Number of Visits 17   Date for PT Re-Evaluation 08/24/16   Authorization Type UHC [facility]   Authorization Time Period 07-24-16 - 09-22-16   PT Start Time 1446   PT Stop Time 1535   PT Time Calculation (min) 49 min   Equipment Utilized During Treatment Gait belt      Past Medical History:  Diagnosis Date  . Asthma   . CAD (coronary artery disease)    myoview 5/08:  EF 76%, no scar, no ischemia, +ECG changes with exercise;  cath 04/08/07:  pLAD 30%, mLAD 60-70% - med tx.  . Chronic cystitis    recurrent UTIs, started bactrim ppx (McDiarmid)  . Depression   . Familial tremor    followed by Dr. Erling Cruz  . GERD (gastroesophageal reflux disease)    s/p nissen  . History of pneumonia   . HLD (hyperlipidemia)   . Hydronephrosis, bilateral   . Hypothyroidism   . Internal hemorrhoids   . 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 Neurogenic bladder    Tannenbaum/MacDiarmid  . Osteoporosis 2016   DEXA T-4.4 spine deteriorated since 2012  . Unspecified chronic bronchitis ([place])   . VAGINITIS, ATROPHIC 11/07/2007    Past Surgical History:  Procedure Laterality Date  . COLONOSCOPY N/A 08/29/2013   int hemm; Inda Castle, MD  . CYSTECTOMY    . DEXA  12/2014   T -4.4 spine  . ESOPHAGOGASTRODUODENOSCOPY N/A 01/09/2015   Procedure: ESOPHAGOGASTRODUODENOSCOPY (EGD);  Surgeon: Inda Castle, MD;  Location: [facility];  Service: Endoscopy;  Laterality: N/A;  help with transfers  . LAPAROSCOPIC NISSEN FUNDOPLICATION    . TUBAL LIGATION    . TUMOR REMOVAL      There were no vitals  filed for this visit.      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 at [facility] clinic in Aug. 2013;Pt. had UTI on 04-11-15; went to ED - received catherization; states she has had increased weakness and some mild decline in mobility since this problem   Patient Stated Goals improve walking and mobility   Currently in Pain? No/denies           Pt requests that we request order from Dr. Danise Mina for order for power wheelchair               [facility] Adult PT Treatment/Exercise - 08/07/16 0001      Ambulation/Gait   Ambulation/Gait Yes   Ambulation/Gait Assistance 3: Mod assist   Ambulation/Gait Assistance Details mod assist to shift weight to fascilitate step after freezing occurrence   Ambulation Distance (Feet) 40 Feet  10, 23'   Assistive device Rolling walker   Gait Pattern Wide base of support;Decreased hip/knee flexion - right;Decreased hip/knee flexion - left;Decreased stride length;Step-through pattern;Abducted- right;Decreased trunk rotation;Decreased step length - right;Decreased step length - left;Festinating   Ambulation Surface Level;Indoor  Timed Up and Go Test   Normal TUG (seconds) --  2" 43 secs with RW                  PT Short Term Goals - 07/22/16 2000      PT SHORT TERM GOAL #1   Title Pt. will increase standing tolerance so she is able to report standing 3" at home with min to CGA for independence with ADL's. (06-25-16)   Baseline varies - functional status varies day to day - at times able to stand for 3" and other times unable - 07-07-16   Time 4   Period Weeks   Status On-going     PT SHORT TERM GOAL #2   Title Amb. with RW 80' with mod assist.  (06-25-16)   Baseline met on 07-07-16 but performance varies   Period Weeks   Status On-going     PT SHORT TERM  GOAL #3   Title Transfer sit to stand from wheelchair with mod assist   (06-25-16)   Baseline inconsistently met as performance varies - 07-07-16   Time 4   Period Weeks   Status On-going     PT SHORT TERM GOAL #4   Title Independent in HEP for stretching and strengthening.  (06-25-16)   Baseline met 07-07-16   Status Achieved     PT SHORT TERM GOAL #5   Title Improve TUG score to </= 50 secs with RW with CGA (06-19-15)   Status Achieved           PT Long Term Goals - 08/06/16 1453      PT LONG TERM GOAL #1   Title Pt. will ambulate 35'  nonstop with RW to demo incr. endurance with CGA  (07-25-16)   Baseline pt amb. 70' with mod assist with RW on 07-23-16; not met 08-06-16   Status Not Met     PT LONG TERM GOAL #2   Title Report ability to stand at least 5" with UE support with min assist at home for improved standing tolerance  (07-25-16)/ 08-22-16   Baseline met 08-06-16   Status Achieved     PT LONG TERM GOAL #3   Title Perform sit to stand transfer from mat to RW with +1 mod assist. (07-25-16)/ 08-22-16   Baseline mod to max assist - performance varies; met on 08-06-16   Status Achieved     PT LONG TERM GOAL #4   Title Amb. 15' in </= 3" to demo incr. gait speed.  (07-25-16)   Baseline 1.34 15" with RW on 08-06-16   Status Achieved     PT LONG TERM GOAL #5   Title Independent to direct assistance prn with stretches and exercises for HEP.  (07-25-16)   Status Achieved               Plan - 08/07/16 1043    Clinical Impression Statement Pt has met LTG's #2-5:  #1 not met due to difficulty and fatigue with ambulation; pt has plateaued in maximizing functional progress at this time   Rehab Potential Good   Clinical Impairments Affecting Rehab Potential severity of deficits and progressive neurological disease   PT Frequency 2x / week   PT Duration 8 weeks   PT Treatment/Interventions ADLs/Self Care Home Management;Therapeutic activities;Patient/family  education;Therapeutic exercise;Gait training;Balance training;Stair training;Neuromuscular re-education;Functional mobility training   PT Home Exercise Plan stretches and supine strengthening exs.   Recommended Other Services recommend power wheelchair evaluation - pt  agrees and requests - [facility] requested as vendor   Consulted and Agree with Plan of Care Patient      Patient will benefit from skilled therapeutic intervention in order to improve the following deficits and impairments:  Abnormal gait, Decreased coordination, Impaired flexibility, Difficulty walking, Decreased balance, Increased muscle spasms, Impaired tone, Decreased mobility, Decreased strength, Decreased activity tolerance, Decreased range of motion  Visit Diagnosis: Other abnormalities of gait and mobility  Unsteadiness on feet  Muscle weakness (generalized)       G-Codes - 09/01/16 1045    Functional Assessment Tool Used TUG score 2" 43 secs with RW;  pt amb. 15' with RW in 1"34 secs; mod assist with sit to stand transfer to RW from wheelchair   Functional Limitation Mobility: Walking and moving around   Mobility: Walking and Moving Around Goal Status [phone]) At least 60 percent but less than 80 percent impaired, limited or restricted   Mobility: Walking and Moving Around Discharge Status [phone]) At least 60 percent but less than 80 percent impaired, limited or restricted      Problem List Patient Active Problem List   Diagnosis Date Noted  . General weakness 02/26/2016  . Chills 02/26/2016  . Other fatigue 02/04/2016  . Neurogenic bladder   . Chronic cystitis 04/18/2015  . Pedal edema 04/01/2015  . Constipation 12/14/2014  . Medicare annual wellness visit, subsequent 11/27/2014  . Advanced care planning/counseling discussion 11/27/2014  . Osteoporosis   . Vitamin B12 deficiency 06/07/2014  . Recurrent falls 02/06/2014  . Multiple system atrophy ([place])   . Dysphagia 07/28/2013  . Shoulder pain, left  07/20/2012  . 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 Health maintenance examination 08/26/2011  . Murmur 05/27/2011  . HYPERCHOLESTEROLEMIA  IIA 05/14/2010  . VENOUS INSUFFICIENCY, LEGS 06/21/2009  . CAD (coronary artery disease) 06/09/2008  . Hypothyroidism 05/15/2008  . ASTHMA 05/15/2008  . Depression 11/07/2007  . 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 GERD 11/07/2007  . VAGINITIS, ATROPHIC 11/07/2007     PHYSICAL THERAPY DISCHARGE SUMMARY  Visits from Start of Care: 14  Current functional level related to goals / functional outcomes: See above for progress towards goals   Remaining deficits: Continued dependency with all mobility including transfers, gait and bed mobility; decreased standing balance with pt unable to stand unsupported   Education / Equipment: Pt requests to start process for power wheelchair evaluation as it is getting much more difficult to propel manual wheelchair; pt has been instructed in HEP for stretching and strengthening exercises. Plan: Patient agrees to discharge.  Patient goals were partially met. Patient is being discharged due to meeting the stated rehab goals.  ?????  Pt has plateaued in maximizing functional progress at this time.         Alda Lea, PT 08/07/2016, 10:47 AM  [facility] [address] [place], [place], [address] Phone: [phone]   Fax:  [phone]  Name: [name] [name] MRN: [id number] Date of Birth: 1941-11-04

## 2016-08-07 NOTE — Telephone Encounter (Addendum)
Received message from PT - discharging from outpatient plz ask pt if interested in HHPT and we will place referral as she qualifies.  Also received request to start power wheelchair order - plz start and will see if approved without separate mobility assessment evaluation.

## 2016-08-07 NOTE — Telephone Encounter (Addendum)
Message left for patient to return my call and advise. Paperwork started with [employer]. (Not sure who else to start with). Looks like will require mobility exam. In your IN box.

## 2016-08-10 ENCOUNTER — Telehealth: Payer: Self-pay | Admitting: Physical Therapy

## 2016-08-10 ENCOUNTER — Telehealth: Payer: Self-pay | Admitting: Family Medicine

## 2016-08-10 DIAGNOSIS — G903 Multi-system degeneration of the autonomic nervous system: Secondary | ICD-10-CM

## 2016-08-10 DIAGNOSIS — G239 Degenerative disease of basal ganglia, unspecified: Principal | ICD-10-CM

## 2016-08-10 NOTE — Telephone Encounter (Signed)
Pt returned your call - she has appt for boniva injection 10/9

## 2016-08-10 NOTE — Telephone Encounter (Signed)
Noted  

## 2016-08-10 NOTE — Telephone Encounter (Signed)
Ordered. Thanks

## 2016-08-10 NOTE — Telephone Encounter (Signed)
Spoke with patient. She declined HHPT at this time. She said she will be going back to OPPT and will just do that as long as she can. This was just a temporary break and medicare wont pay for both.

## 2016-08-10 NOTE — Telephone Encounter (Signed)
Dr. Danise Mina, [name] [name] (07-29-2041) is requesting to start the process for obtaining a power wheelchair.  She says she would like to have you as the referring physician for this - if you agree would you please send an order for PT evaluation for power wheelchair through [employer]?  Thank you, Guido Sander, PT

## 2016-08-14 ENCOUNTER — Other Ambulatory Visit (HOSPITAL_COMMUNITY): Payer: Self-pay | Admitting: *Deleted

## 2016-08-17 ENCOUNTER — Ambulatory Visit (HOSPITAL_COMMUNITY)
Admission: RE | Admit: 2016-08-17 | Discharge: 2016-08-17 | Disposition: A | Discharge status: Home / Self Care | Payer: Medicare Other | Source: Ambulatory Visit | Attending: Family Medicine | Admitting: Family Medicine

## 2016-08-17 DIAGNOSIS — M810 Age-related osteoporosis without current pathological fracture: Secondary | ICD-10-CM | POA: Diagnosis not present

## 2016-08-17 MED ORDER — SODIUM CHLORIDE 0.9 % IV SOLN
INTRAVENOUS | Status: DC
  Administered 2016-08-17: 13:00:00 via INTRAVENOUS

## 2016-08-17 MED ORDER — IBANDRONATE SODIUM 3 MG/3ML IV SOLN
INTRAVENOUS | Status: AC
  Filled 2016-08-17: qty 3

## 2016-08-17 MED ORDER — IBANDRONATE SODIUM 3 MG/3ML IV SOLN
3.0000 mg | Freq: Once | INTRAVENOUS | Status: AC
  Administered 2016-08-17: 3 mg via INTRAVENOUS

## 2016-08-22 ENCOUNTER — Other Ambulatory Visit: Payer: Self-pay | Admitting: Family Medicine

## 2016-09-09 ENCOUNTER — Telehealth: Payer: Self-pay

## 2016-09-09 NOTE — Telephone Encounter (Signed)
Pt left v/m; pt is in a w/c and her husband is her caregiver; pts husband [name] is going to have surgery in near future (no date set for surgery for aneurysm). Pt is trying to decide to stay home and have caregivers come to the home to take care of pt or if pt might temporarily go to nursing home while her husband recuperates. Pt wants to know if Dr Darnell Level can assist pt with getting something set up for pts care until her husband is able to take care of her again. Mrs [facility] request cb. Pt last annual exam on 08/03/16.

## 2016-09-10 ENCOUNTER — Telehealth: Payer: Self-pay | Admitting: *Deleted

## 2016-09-10 NOTE — Telephone Encounter (Signed)
We can try to help with whichever she prefers. If she decides to go to short term rehab we can fill out FL2 if needed. Pt may have to call local skilled nursing facilities to inquire about this -- would start at [facility]. Does Rosaria Ferries have any any other recs?

## 2016-09-10 NOTE — Telephone Encounter (Signed)
Patient notified and will research options and will be back in touch. Her mother is at [facility], so that was her first choice.

## 2016-09-10 NOTE — Telephone Encounter (Signed)
Pt left voicemail at Triage, she said the only form they would need at [facility] is an [facility] form faxed to them at att: Levi Aland [phone]

## 2016-09-10 NOTE — Telephone Encounter (Signed)
In your IN box for completion.  

## 2016-09-10 NOTE — Telephone Encounter (Signed)
Filled and placed in Kim's box. 

## 2016-09-10 NOTE — Telephone Encounter (Signed)
Maudie Mercury can we fill out FL2 form? Thanks.

## 2016-09-11 NOTE — Telephone Encounter (Signed)
Patient's husband notified and form placed up front for pick up.

## 2016-09-14 ENCOUNTER — Ambulatory Visit: Payer: Medicare Other | Admitting: Physical Therapy

## 2016-10-06 ENCOUNTER — Ambulatory Visit (INDEPENDENT_AMBULATORY_CARE_PROVIDER_SITE_OTHER): Payer: Medicare Other | Admitting: Family Medicine

## 2016-10-06 ENCOUNTER — Encounter: Payer: Self-pay | Admitting: Family Medicine

## 2016-10-06 VITALS — BP 140/72 | HR 71 | Temp 97.5°F

## 2016-10-06 DIAGNOSIS — N3020 Other chronic cystitis without hematuria: Secondary | ICD-10-CM

## 2016-10-06 DIAGNOSIS — M47812 Spondylosis without myelopathy or radiculopathy, cervical region: Secondary | ICD-10-CM | POA: Insufficient documentation

## 2016-10-06 DIAGNOSIS — S161XXA Strain of muscle, fascia and tendon at neck level, initial encounter: Secondary | ICD-10-CM | POA: Diagnosis not present

## 2016-10-06 LAB — POC URINALSYSI DIPSTICK (AUTOMATED)
Bilirubin, UA: NEGATIVE
Glucose, UA: NEGATIVE
Ketones, UA: NEGATIVE
Leukocytes, UA: NEGATIVE
Nitrite, UA: NEGATIVE
PROTEIN UA: NEGATIVE
SPEC GRAV UA: 1.025
UROBILINOGEN UA: 0.2
pH, UA: 6.5

## 2016-10-06 NOTE — Patient Instructions (Signed)
I think neck pain is coming from neck strain. Treat with gentle stretching, heating pad to neck as tolerated, and may try baclofen.  Exercises provided today.  Urine checked today. I hope your husband a speedy recovery!

## 2016-10-06 NOTE — Assessment & Plan Note (Signed)
Check UA - ok

## 2016-10-06 NOTE — Progress Notes (Signed)
Pre visit review using our clinic review tool, if applicable. No additional management support is needed unless otherwise documented below in the visit note. 

## 2016-10-06 NOTE — Progress Notes (Signed)
BP 140/72   Pulse 71   Temp 97.5 F (36.4 C) (Oral)   SpO2 96%    CC: neck pain, check urine Subjective:    Patient ID: [name] [name], female    DOB: March 06, 1941, 75 y.o.   MRN: [id number]  HPI: GENORA ARP is a 75 y.o. female presenting on 10/06/2016 for Neck Pain and concentrated urine   Here with caregiver Olivia Mackie. Husband just discharged from hospital after 8cm AAA repair complicated with bleed (needing 22u pRBC transfusions).   Known MSA previously thought worsening chills a manifestation of temperature dysregulation from autonomic instability. Saw neurology Dr Rexene Alberts 04/2016 - suggested trial baclofen for muscle spasms and warm beverages and blanket for chills. Baclofen didn't really help.   Noticing increased intermittent L sided neck pain over last 3 weeks. Denies falls, injury to neck. No new pillow. Sleeps in lift chair.   Chronic cystitis - sees urology, on keflex 252m QD ppx and myrbetriq 524mdaily and cranberry tablet. Noticing more concentrated and frequent urination.   Relevant past medical, surgical, family and social history reviewed and updated as indicated. Interim medical history since our last visit reviewed. Allergies and medications reviewed and updated. Current Outpatient Prescriptions on File Prior to Visit  Medication Sig  . albuterol (PROAIR HFA) 108 (90 BASE) MCG/ACT inhaler Inhale 2 puffs into the lungs every 6 (six) hours as needed for wheezing or shortness of breath. Reported on 01/22/2016  . aspirin (ASPIRIN EC) 81 MG EC tablet Take 81 mg by mouth 2 (two) times a week. Swallow whole. Take at bedtime  . cephALEXin (KEFLEX) 250 MG capsule Take 250 mg by mouth daily. UTI PPX  . Cholecalciferol (VITAMIN D3) 50000 units TABS Take 1 tablet by mouth once a week.  . clonazePAM (KLONOPIN) 0.5 MG tablet Take 0.5 mg by mouth 2 (two) times daily as needed (tremors).  . Cranberry (ELLURA) 200 MG CAPS Take 1 capsule by mouth every evening.   . Marland KitchenRESTOR 20 MG  tablet TAKE ONE-HALF TABLET BY MOUTH ONCE EVERY EVENING  . cyanocobalamin (,VITAMIN B-12,) 1000 MCG/ML injection Inject 1,000 mcg into the muscle every 30 (thirty) days. Take the first of the month  . dexlansoprazole (DEXILANT) 60 MG capsule Take 60 mg by mouth daily as needed. For acid reflux  . famotidine (PEPCID) 40 MG tablet TAKE 1 TABLET (40 MG TOTAL) BY MOUTH 2 (TWO) TIMES DAILY AS NEEDED FOR HEARTBURN.  . furosemide (LASIX) 20 MG tablet Take 1 tablet (20 mg total) by mouth daily as needed for fluid or edema.  . hyoscyamine (LEVSIN SL) 0.125 MG SL tablet PLACE 2 TABLETS (0.25 MG TOTAL) UNDER THE TONGUE EVERY 4 (FOUR) HOURS AS NEEDED (SPASMS).  . isosorbide mononitrate (IMDUR) 30 MG 24 hr tablet TAKE 1/2 TABLET BY MOUTH DAILY  . MYRBETRIQ 50 MG TB24 tablet Take 50 mg by mouth daily.  . nitroGLYCERIN (NITROSTAT) 0.4 MG SL tablet Place 1 tablet (0.4 mg total) under the tongue every 5 (five) minutes as needed (chest pain).  . polyethylene glycol powder (GLYCOLAX/MIRALAX) powder Take 17 g by mouth daily as needed for moderate constipation.  . propranolol (INDERAL) 10 MG tablet TAKE 1 TABLET BY MOUTH 3 TIMES DAILY AS NEEDED (TREMORS).  . Marland Kitchenertraline (ZOLOFT) 25 MG tablet Take 1 tablet (25 mg total) by mouth daily.  . Marland KitchenYNTHROID 112 MCG tablet TAKE 1 TABLET (112 MCG TOTAL) BY MOUTH DAILY BEFORE BREAKFAST.  . Marland Kitchenbandronate (BONIVA) 3 MG/3ML SOLN injection Inject 3 mLs (3  mg total) into the vein every 3 (three) months.   No current facility-administered medications on file prior to visit.     Review of Systems Per HPI unless specifically indicated in ROS section     Objective:    BP 140/72   Pulse 71   Temp 97.5 F (36.4 C) (Oral)   SpO2 96%   Wt Readings from Last 3 Encounters:  08/17/16 150 lb (68 kg)  08/03/16 155 lb (70.3 kg)  03/17/16 145 lb (65.8 kg)    Physical Exam  Constitutional: She appears well-developed and well-nourished. No distress.  Neck: Normal range of motion. Neck  supple. No thyromegaly present.  Abdominal: Soft. Bowel sounds are normal. She exhibits no distension and no mass. There is no tenderness. There is no rebound and no guarding.  Musculoskeletal: Normal range of motion. She exhibits no edema.  Mild decreased ROM at neck without significant discomfort Mild discomfort to palpation at left paracervical mm without mass/swelling  Skin: Skin is warm and dry. No rash noted.  Psychiatric: She has a normal mood and affect.  Nursing note and vitals reviewed.  Results for orders placed or performed in visit on 10/06/16  POCT Urinalysis Dipstick (Automated)  Result Value Ref Range   Color, UA light yellow    Clarity, UA clear    Glucose, UA negative    Bilirubin, UA negative    Ketones, UA negative    Spec Grav, UA 1.025    Blood, UA trace    pH, UA 6.5    Protein, UA negative    Urobilinogen, UA 0.2    Nitrite, UA negative    Leukocytes, UA Negative Negative       Assessment & Plan:   Problem List Items Addressed This Visit    Chronic cystitis    Check UA - ok      Relevant Orders   POCT Urinalysis Dipstick (Automated) (Completed)   Neck strain - Primary    Benign exam. rec try baclofen she has at home as well as heating pad, gentle stretching with exercises provided today. Update if not improving with this.           Follow up plan: Return if symptoms worsen or fail to improve.  Ria Bush, MD

## 2016-10-06 NOTE — Assessment & Plan Note (Signed)
Benign exam. rec try baclofen she has at home as well as heating pad, gentle stretching with exercises provided today. Update if not improving with this.

## 2016-10-08 DIAGNOSIS — N3000 Acute cystitis without hematuria: Secondary | ICD-10-CM | POA: Diagnosis not present

## 2016-10-19 ENCOUNTER — Ambulatory Visit (INDEPENDENT_AMBULATORY_CARE_PROVIDER_SITE_OTHER): Payer: Medicare Other | Admitting: Neurology

## 2016-10-19 ENCOUNTER — Encounter: Payer: Self-pay | Admitting: Neurology

## 2016-10-19 VITALS — BP 148/86 | HR 84

## 2016-10-19 DIAGNOSIS — G903 Multi-system degeneration of the autonomic nervous system: Secondary | ICD-10-CM

## 2016-10-19 DIAGNOSIS — E538 Deficiency of other specified B group vitamins: Secondary | ICD-10-CM | POA: Diagnosis not present

## 2016-10-19 DIAGNOSIS — G239 Degenerative disease of basal ganglia, unspecified: Secondary | ICD-10-CM | POA: Diagnosis not present

## 2016-10-19 DIAGNOSIS — M62838 Other muscle spasm: Secondary | ICD-10-CM

## 2016-10-19 MED ORDER — BACLOFEN 10 MG PO TABS: 10.0000 mg | ORAL_TABLET | Freq: Every evening | ORAL | 5 refills | Status: DC | PRN

## 2016-10-19 MED ORDER — CYANOCOBALAMIN 1000 MCG/ML IJ SOLN
1000.0000 ug | Freq: Once | INTRAMUSCULAR | Status: AC
  Administered 2016-10-19: 1000 ug via INTRAMUSCULAR

## 2016-10-19 NOTE — Patient Instructions (Signed)
Please try taking low dose baclofen 10 mg each night as needed. You can take it every night, but remember, it can make you sleepy.

## 2016-10-19 NOTE — Progress Notes (Signed)
Subjective:    Patient ID: [name] [name] is a 75 y.o. female.  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 She is accompanied by a caretaker, [name] [name], today. Her husband had surgery. I last saw her on 04/28/2016, at which time she reported having had chills, felt cold all the time, thyroid function was checked by PCP about 3 months prior. She was not sleeping very well. Her husband was having a difficult time caring for her. He had lost weight and reported more stress. She was checked by cardiology and was stable from a cardiac standpoint. She had received a B12 shot recently through PCP. She went to the emergency room on 03/17/2016 for uncontrollable chills. For muscle spasms I suggested she try low-dose baclofen.  Today, 10/19/2016: She reports stress what with her husband's care. He needed surgery, and had to be in the hospital for about 2 weeks. He is home now. They have help at the house from 9-4 and then from 4 to 10 PM. Son lives with them. Daughter passed away three years ago. She tried the baclofen on a few occasions, is not sure if it helps for her spasms and feelings of chills.   Previously:   I saw her on 09/18/2015, at which time she reported muscle spasms in various places. They did not feel like cramps. She was in the interim seen by [name] [name], nurse practitioner, on 01/22/2016.    I saw her on 07/29/2015, at which time she reported doing fairly well. No choking, no recent falls and he her husband was helping her with mobilizing with a walker. She had to go to  the emergency room on 04/11/2015 for UTI. She was in outpatient physical therapy. She was seeing her urologist regularly and they had talked about Botox injections and potentially a surgically placed catheter. She had no headaches. She had a follow-up phone call from the [facility] about a potential research study but she did not get encouragement to pursue it from the doctor. In the interim, she calls in October 2016 reporting a 2-4 day history of increased muscle spasms. She felt that she had spasms all over. She was encouraged to see her primary care provider. She was seen and had blood work which showed no abnormality of her electrolytes, kidney function. Her magnesium level was normal.     I saw her on 03/27/2015, at which time she reported new left hand numbness for the past 1 month. She woke up with left hand symptoms. She had some numbness and tingling. Her husband noted that she was leaning to the left. Her last brain MRI was on 07/30/2015 which showed no acute abnormality. I reviewed the MRI last time. She denied any slurring of speech, droopy face, lower extremity weakness. She had some lower extremity swelling which was worse. Her left hand numbness improved after a few days. Her leaning improved. She had finished physical therapy in April 2016 and felt it was helpful and wondered if she could restart it. I referred her back to physical therapy outpatient. I suggested we proceed with a brain MRI. She had a brain MRI  without contrast on 04/23/2015 and we called her subsequently with her test results: Abnormal MRI brain (without) demonstrating:   1. Small left fronto-parietal, parasagittal focus of encephalomalacia and gliosis, possible a chronic ischemic infarction.   2. Few scattered periventricular foci of non-specific gliosis. 3. No acute findings. 4. No significant change from MRI on 07/29/12.   In addition, I personally reviewed the images through the PACS system.   I saw her on 12/11/2014,  at which time she reported feeling about the same as far as her motor symptoms. She had more freezing, especially in her right leg. She was in physical therapy and felt it was helpful. She was trying to do stretching at home. Her husband reported that he does not feel comfortable leaving her at home alone. They were in touch with [facility] over the phone but there were no updates on research for multiple system atrophy. She has a call alert button. She had no recent falls. She was having trouble going to sleep. She was sleeping in her lift chair. She had new reading glasses. She had diarrhea in the past when she tried Linzess through her GI doctor. She requested a sooner than scheduled appointment due to abnormal posture and left-sided numbness.   I saw her on 07/05/2014, at which time she reported having had more falls. She had not been seen at the [facility] clinic. Stem cell trial was discussed and another trial in [place] with alpha-synuclein depleting therapy at the last visit there. She was eating well and her spirits were good. We restarted outpatient physical therapy and she has been going to the neuro rehabilitation unit.    I saw her on 02/02/2014, at which time she reported that physical therapy was helpful. She had fallen a few times and bruised her ribs. She had a colonoscopy on 08/29/2013. She reported a history of hemorrhoids and chronic constipation for which she had tried Dulcolax. I referred her back to physical therapy. 4 chronic constipation I asked her to try Linzess. I did not suggest any other new medications. She goes to the [facility] once a year and had an appointment in on 04/27/14 and I reviewed the note and shared the data with them. She had previously tried and failed Sinemet.    I saw her on 09/05/2013, at which time she presented for a sooner than scheduled appointment due to recurrent falls. She had fallen 6 times within about 1-1/2 weeks' time. 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 I advised her to use her walker at all times. We talked about potentially going to a motorized wheelchair down the [place]. She was advised to drink more water. She had tried and failed Sinemet in the past. I suggested a second opinion at [facility] which they declined. I referred her to physical and occupational therapy outpatient. In the interim, I prescribed a light weight wheelchair for her. Her daughter recently died. She had advanced MS.   I first met her on 05/10/2013, at which time we discussed the possibility of MSA. I did not make any medication changes. We talked about the nature of progressive parkinsonism and gait disorder. She had her last outpatient PT in July 2014. She tried to do stretching at home. She had a colonoscopy.   She previously followed with [name] [name] and was last seen by him on 12/30/2012, at which time he  felt that she had worsening of her gait. She also had symptoms of vertigo. She has been on baby aspirin, Synthroid, Crestor, and/or, clonazepam, propranolol, sertraline, trimethoprim, Nasonex, Provera, nitroglycerin as needed, vitamin B12.   She was diagnosed about 20 years ago with essential tremor. She had head tremor more than upper extremity tremor. She has a history of lung disease but was able to take low-dose propranolol. She has not tried primidone. She has had gait and balance problems in the last 5+ years approximately. She has to hold onto something. She has had bladder incontinence since her 67s. She was on Crestor for 3 years which was discontinued in June 2011. CK was normal and EMG nerve conduction studies were normal in June 2011. Crestor was restarted. She has been on B12 injections without subjective improvement. MRI brain with and without contrast showed hyperostosis frontalis, MRI C-spine showed mild degenerative joint disease. 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 Lumbar spine MRI from July 2012 showed mild degenerative disc disease. 4 gait dysfunction she was referred to [name] [name] with a question of myelopathy versus orthostatic tremor versus lower half parkinsonism versus essential tremor with gait disorder. 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 Further testing revealed negative tilt table test but there was mild autonomic neuropathy per autonomic reflex screen. She has had swelling in both legs with negative Doppler of the legs and normal echocardiogram. Additional workup at [facility] included vitamin D, paraneoplastic antibodies, anti-GAD, all negative.   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 She has been using a 2 wheeled walker for the past 2 years.   She has been in PT and OT with improvement.   She endorsed a lot of stress, due primarily to her mother's and daughter's health, who died in 02-Feb-2014. Mother is in her 50s and in a NH. She sleeps in a recliner, d/t pain in her trunk, back, neck and stiffness and difficulty getting in and out of bed.  Her Past Medical History Is Significant For: Past Medical History:  Diagnosis Date  . Asthma   . CAD (coronary artery disease)    myoview 5/08:  EF 76%, no scar, no ischemia, +ECG changes with exercise;  cath 04/08/07:  pLAD 30%, mLAD 60-70% - med tx.  . Chronic cystitis    recurrent UTIs, started bactrim ppx (McDiarmid)  . Depression   . Familial tremor    followed by [name] [name]  . GERD (gastroesophageal reflux disease)    s/p nissen  . History of pneumonia   . HLD (hyperlipidemia)   . Hydronephrosis, bilateral   . Hypothyroidism   . Internal hemorrhoids    . 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 Neurogenic bladder    Tannenbaum/MacDiarmid  . Osteoporosis 2016   DEXA T-4.4 spine deteriorated since 2012  . Unspecified chronic bronchitis   . VAGINITIS, ATROPHIC 11/07/2007    Her Past Surgical History Is Significant For: Past Surgical History:  Procedure Laterality Date  . COLONOSCOPY N/A 08/29/2013   int hemm; [name] Castle, MD  . CYSTECTOMY    . DEXA  12/2014  T -4.4 spine  . ESOPHAGOGASTRODUODENOSCOPY N/A 01/09/2015   Procedure: ESOPHAGOGASTRODUODENOSCOPY (EGD);  Surgeon: [name] Castle, MD;  Location: [facility];  Service: Endoscopy;  Laterality: N/A;  help with transfers  . LAPAROSCOPIC NISSEN FUNDOPLICATION    . TUBAL LIGATION    . TUMOR REMOVAL      Her Family History Is Significant For: Family History  Problem Relation Age of Onset  . Hypertension Mother   . Coronary artery disease Father   . Heart attack Father   . Heart disease Father   . Diabetes Daughter   . Breast cancer Maternal Aunt   . Coronary artery disease Daughter   . Colon cancer Maternal Aunt   . Multiple sclerosis Daughter   . Stroke Daughter   . Heart attack Daughter     Her Social History Is Significant For: Social History   Social History  . Marital status: Married    Spouse name: [name] [name]  . Number of children: 2  . Years of education: HS   Occupational History  .  Other   Social History Main Topics  . Smoking status: Never Smoker  . Smokeless tobacco: Never Used  . Alcohol use No  . Drug use: No  . Sexual activity: Not Currently   Other Topics Concern  . None   Social History Narrative   HSG, retired from [employer] . married '60. 1 son - '65; 1 daughter - '61; 2 grandchildren; 1 great-grandchild. Homemaker. marriage in good health. 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 Positive history of passive tobacco smoke exposure.   Caffeine Use: 1 cup daily; 2 glasses of tea daily    Her Allergies Are:  Allergies   Allergen Reactions  . Keflex [Cephalexin] Diarrhea  :   Her Current Medications Are:  Outpatient Encounter Prescriptions as of 10/19/2016  Medication Sig  . albuterol (PROAIR HFA) 108 (90 BASE) MCG/ACT inhaler Inhale 2 puffs into the lungs every 6 (six) hours as needed for wheezing or shortness of breath. Reported on 01/22/2016  . aspirin (ASPIRIN EC) 81 MG EC tablet Take 81 mg by mouth 2 (two) times a week. Swallow whole. Take at bedtime  . cephALEXin (KEFLEX) 250 MG capsule Take 250 mg by mouth daily. UTI PPX  . Cholecalciferol (VITAMIN D3) 50000 units TABS Take 1 tablet by mouth once a week.  . clonazePAM (KLONOPIN) 0.5 MG tablet Take 0.5 mg by mouth 2 (two) times daily as needed (tremors).  . Cranberry (ELLURA) 200 MG CAPS Take 1 capsule by mouth every evening.   Marland Kitchen CRESTOR 20 MG tablet TAKE ONE-HALF TABLET BY MOUTH ONCE EVERY EVENING  . cyanocobalamin (,VITAMIN B-12,) 1000 MCG/ML injection Inject 1,000 mcg into the muscle every 30 (thirty) days. Take the first of the month  . dexlansoprazole (DEXILANT) 60 MG capsule Take 60 mg by mouth daily as needed. For acid reflux  . famotidine (PEPCID) 40 MG tablet TAKE 1 TABLET (40 MG TOTAL) BY MOUTH 2 (TWO) TIMES DAILY AS NEEDED FOR HEARTBURN.  . furosemide (LASIX) 20 MG tablet Take 1 tablet (20 mg total) by mouth daily as needed for fluid or edema.  . hyoscyamine (LEVSIN SL) 0.125 MG SL tablet PLACE 2 TABLETS (0.25 MG TOTAL) UNDER THE TONGUE EVERY 4 (FOUR) HOURS AS NEEDED (SPASMS).  . ibandronate (BONIVA) 3 MG/3ML SOLN injection Inject 3 mLs (3 mg total) into the vein every 3 (three) months.  . isosorbide mononitrate (IMDUR) 30 MG 24 hr tablet TAKE 1/2 TABLET BY MOUTH DAILY  . MYRBETRIQ  50 MG TB24 tablet Take 50 mg by mouth daily.  . nitroGLYCERIN (NITROSTAT) 0.4 MG SL tablet Place 1 tablet (0.4 mg total) under the tongue every 5 (five) minutes as needed (chest pain).  . polyethylene glycol powder (GLYCOLAX/MIRALAX) powder Take 17 g by mouth daily  as needed for moderate constipation.  . propranolol (INDERAL) 10 MG tablet TAKE 1 TABLET BY MOUTH 3 TIMES DAILY AS NEEDED (TREMORS).  Marland Kitchen sertraline (ZOLOFT) 25 MG tablet Take 1 tablet (25 mg total) by mouth daily.  Marland Kitchen SYNTHROID 112 MCG tablet TAKE 1 TABLET (112 MCG TOTAL) BY MOUTH DAILY BEFORE BREAKFAST.  . [EXPIRED] cyanocobalamin ((VITAMIN B-12)) injection 1,000 mcg    No facility-administered encounter medications on file as of 10/19/2016.   :  Review of Systems:  Out of a complete 14 point review of systems, all are reviewed and negative with the exception of these symptoms as listed below: Review of Systems  Neurological:       Pt presents today for follow up. Pt denies any new complaints at this time. Pt is accompanied by her caretaker, as her husband just had major surgery and is unable to take her. Pt is requesting that her vitamin b12 shots be given at her PCP office since that office is closer to her.    Objective:  Neurologic Exam  Physical Exam Physical Examination:   Vitals:   10/19/16 1512  BP: (!) 148/86  Pulse: 84   General Examination: The patient is a very pleasant 75 y.o. female in no acute distress. She appears well-developed and well-nourished and very well groomed. She is in good spirits today, her usual self. She is situated in her wheelchair.  HEENT: Normocephalic, atraumatic, pupils are equal, round and reactive to light and accommodation. Extraocular tracking shows mild limitation to upper gaze. She has saccadic breakdown of smooth pursuit but no nystagmus. Hearing is mildly impaired. She has hearing aids in place. She has mild to moderate facial masking and normal facial sensation. Speech shows mild to moderate hypophonia with mild to moderate dysarthria and mild to moderate voice tremor. Neck shows moderate to severe rigidity. No spasms noted. There is a mild head, no-no type tremor. There are no carotid bruits on auscultation. Head is tilted to the L and she  tends to lean a little bit to the left in her wheelchair, better from last time. Oropharynx exam reveals: mild to moderate mouth dryness, adequate dental hygiene and mild airway crowding, due to narrow airway. Mallampati is class II.   Chest: Clear to auscultation without wheezing, rhonchi or crackles noted.  Heart: S1+S2+0, regular and normal without murmurs, rubs or gallops noted.   Abdomen: Soft, non-tender and non-distended with normal bowel sounds appreciated on auscultation.  Extremities: There is 1+ pitting edema in the distal lower extremities bilaterally, R more than L.     Skin: Warm and dry without trophic changes noted. She has some smaller bruises on her hands.   Musculoskeletal: exam reveals no obvious joint deformities, tenderness or joint swelling or erythema.   Neurologically:  Mental status: The patient is awake, alert and oriented in all 4 spheres. Her memory, attention, language and knowledge are fairly well preserved. There is no aphasia, agnosia, apraxia or anomia. Speech is mildly tremulous and slightly dysarthric with normal prosody and enunciation. Thought process is linear. Mood is congruent and affect is normal.  Cranial nerves are as described above under HEENT exam. In addition, shoulder shrug is normal with equal shoulder height noted.  Motor exam: she has thin bulk, 4/5 global strength and tone is increased throughout, moderately, R>L. There is no drift, mild intermittent resting tremor in the RUE, no rebound. Grip strength is a little bit worse on the right. She has a mild action tremor bilaterally as well as a mild postural tremor, right worse than left. Romberg is not testable, as she cannot stand. Reflexes are 2+ in the UEs and 3+ in the LEs, unchanged. Fine motor skills are moderately impaired in the UEs and severely impaired in the LEs, with no significant lateralization.  Cerebellar testing shows no dysmetria or intention tremor on finger to nose testing. There  is no truncal or gait ataxia. Cannot do HTS. Sensory exam is intact to light touch in both upper and lower extremities.  Gait, station and balance: I did not try to stand or walk her today, she requires maximum assistance.   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 She has progressed with time, as is expected for this rare, neurodegenerative disease. She had reported generalized muscle spasms , particularly in her abdominal area, for which I had suggested she try low-dose baclofen. She tried it a few times but not consistently and is not sure if it helped. I suggested she try 10 mg strength at night as needed. She can take it every night as well. She is reminded that it can make her sleepy. Unfortunately, her husband has had significant health issues. He needed surgery about a month ago and is recovering slowly. Thankfully, they have help at the house with caretakers from 9 AM to about 10 PM and their son lives with them and can assist at night if needed.  She had a brain MRI in June 2016 with stable findings compared to September 2013, which was reassuring.  She has fallen repeatedly in the past but not recently thankfully. She is primarily confined to her wheelchair and sleeps in her lift chair. She has been seeing a GI specialist and a urologist for her bladder spasms and recurrent urinary tract infections. She received a B12 injection today with [place]. I suggested a six-month checkup, sooner as needed. I answered all her questions today and the patient was in agreement. I spent 20 minutes in total face-to-face time  with the patient, more than 50% of which was spent in counseling and coordination of care, reviewing test results, reviewing medication and discussing or reviewing the diagnosis of MSA, its prognosis and treatment options.

## 2016-10-27 ENCOUNTER — Ambulatory Visit: Payer: Medicare Other | Admitting: Family Medicine

## 2016-11-05 ENCOUNTER — Other Ambulatory Visit: Payer: Self-pay | Admitting: Cardiovascular Disease

## 2016-11-06 ENCOUNTER — Other Ambulatory Visit: Payer: Self-pay

## 2016-11-06 MED ORDER — ALBUTEROL SULFATE HFA 108 (90 BASE) MCG/ACT IN AERS: 2.0000 | INHALATION_SPRAY | Freq: Four times a day (QID) | RESPIRATORY_TRACT | 0 refills | Status: DC | PRN

## 2016-11-06 NOTE — Telephone Encounter (Signed)
Pt needed a refill of albuterol. I have sent it to the pharmacy

## 2016-11-11 ENCOUNTER — Other Ambulatory Visit: Payer: Self-pay | Admitting: Cardiovascular Disease

## 2016-11-12 ENCOUNTER — Other Ambulatory Visit: Payer: Self-pay | Admitting: Internal Medicine

## 2016-11-14 ENCOUNTER — Other Ambulatory Visit: Payer: Self-pay | Admitting: Family Medicine

## 2016-11-16 ENCOUNTER — Telehealth: Payer: Self-pay | Admitting: Internal Medicine

## 2016-11-16 MED ORDER — DEXLANSOPRAZOLE 60 MG PO CPDR: 60.0000 mg | DELAYED_RELEASE_CAPSULE | Freq: Every day | ORAL | 1 refills | Status: DC

## 2016-11-16 NOTE — Telephone Encounter (Signed)
I have spoken to patient to advise of Dr Vena Rua response and she verbalizes understanding. Dexilant sent until office visit.

## 2016-11-16 NOTE — Telephone Encounter (Signed)
Dr Hilarie Fredrickson- Patient is requesting refills on Dexilant. Per your last office note on 12/2015, patient was taking famotidine twice daily and this has been filled several times. I have spoken to patient's husband (caregiver) who states that patient takes Dexilant only as needed when famotidine does not work. She has an upcoming appt with you on 01/11/17. Dexilant was last prescribed by Dr Deatra Ina. Are you okay with me filling Dexilant for PRN dosing?

## 2016-11-16 NOTE — Telephone Encounter (Signed)
Last refill 10/05/15 #90 +3, last OV 10/06/16.

## 2016-11-16 NOTE — Telephone Encounter (Signed)
Dexilant should not be used when necessary, rather this is best used daily with famotidine when necessary Okay to refill, we will discuss at her office visit as scheduled

## 2016-12-01 ENCOUNTER — Ambulatory Visit: Payer: Medicare Other | Admitting: Internal Medicine

## 2016-12-04 ENCOUNTER — Telehealth: Payer: Self-pay | Admitting: Neurology

## 2016-12-04 NOTE — Telephone Encounter (Signed)
Pt called said she wants to get B12 from PCP. He is requesting a letter as to why she needs B12. [place] (p) [phone]  (f) [phone]. Please fax  Thank you

## 2016-12-07 ENCOUNTER — Encounter: Payer: Self-pay | Admitting: Family Medicine

## 2016-12-07 ENCOUNTER — Ambulatory Visit (INDEPENDENT_AMBULATORY_CARE_PROVIDER_SITE_OTHER): Payer: Medicare Other | Admitting: Family Medicine

## 2016-12-07 VITALS — BP 138/82 | HR 72 | Temp 97.7°F

## 2016-12-07 DIAGNOSIS — G239 Degenerative disease of basal ganglia, unspecified: Secondary | ICD-10-CM | POA: Diagnosis not present

## 2016-12-07 DIAGNOSIS — R6883 Chills (without fever): Secondary | ICD-10-CM

## 2016-12-07 DIAGNOSIS — M542 Cervicalgia: Secondary | ICD-10-CM | POA: Diagnosis not present

## 2016-12-07 DIAGNOSIS — G903 Multi-system degeneration of the autonomic nervous system: Secondary | ICD-10-CM

## 2016-12-07 LAB — POC URINALSYSI DIPSTICK (AUTOMATED)
Bilirubin, UA: NEGATIVE
Glucose, UA: NEGATIVE
Ketones, UA: NEGATIVE
LEUKOCYTES UA: NEGATIVE
NITRITE UA: NEGATIVE
PROTEIN UA: NEGATIVE
RBC UA: NEGATIVE
Spec Grav, UA: 1.015
UROBILINOGEN UA: 0.2
pH, UA: 7

## 2016-12-07 NOTE — Telephone Encounter (Signed)
How do you advise? H/O B-12 deficiency.

## 2016-12-07 NOTE — Progress Notes (Signed)
Pre visit review using our clinic review tool, if applicable. No additional management support is needed unless otherwise documented below in the visit note. 

## 2016-12-07 NOTE — Progress Notes (Signed)
BP 138/82   Pulse 72   Temp 97.7 F (36.5 C) (Oral)    CC: chills, neck pain Subjective:    Patient ID: [name] [name], female    DOB: August 15, 1941, 76 y.o.   MRN: [id number]  HPI: [name] [name] is a 76 y.o. female presenting on 12/07/2016 for Chills ("sub-normal temp") and Neck Pain (recheck from previous)   Known MSA previously thought worsening chills a manifestation of temperature dysregulation from autonomic instability. rec baclofen, warm beverages and blanket for chills. Chills worsening - wake her up at night, body shaking. She checks temperature - it runs 96.6. Thermostat set at 57. Denies urinary symptoms.   Noticing persistent intermittent L sided neck pain for last 2 months. Baclofen doesn't significantlyl help. Denies injury/trauma to neck. No new pillows. Sleeps in lift chair.   Relevant past medical, surgical, family and social history reviewed and updated as indicated. Interim medical history since our last visit reviewed. Allergies and medications reviewed and updated. Current Outpatient Prescriptions on File Prior to Visit  Medication Sig  . albuterol (PROAIR HFA) 108 (90 Base) MCG/ACT inhaler Inhale 2 puffs into the lungs every 6 (six) hours as needed for wheezing or shortness of breath. Reported on 01/22/2016  . aspirin (ASPIRIN EC) 81 MG EC tablet Take 81 mg by mouth 2 (two) times a week. Swallow whole. Take at bedtime  . baclofen (LIORESAL) 10 MG tablet Take 1 tablet (10 mg total) by mouth at bedtime as needed for muscle spasms.  . cephALEXin (KEFLEX) 250 MG capsule Take 250 mg by mouth daily. UTI PPX  . Cholecalciferol (VITAMIN D3) 50000 units TABS Take 1 tablet by mouth once a week.  . clonazePAM (KLONOPIN) 0.5 MG tablet Take 0.5 mg by mouth 2 (two) times daily as needed (tremors).  . Cranberry (ELLURA) 200 MG CAPS Take 1 capsule by mouth every evening.   . cyanocobalamin (,VITAMIN B-12,) 1000 MCG/ML injection Inject 1,000 mcg into the muscle every 30 (thirty)  days. Take the first of the month  . dexlansoprazole (DEXILANT) 60 MG capsule Take 1 capsule (60 mg total) by mouth daily.  . famotidine (PEPCID) 40 MG tablet TAKE 1 TABLET (40 MG TOTAL) BY MOUTH 2 (TWO) TIMES DAILY AS NEEDED FOR HEARTBURN.  . furosemide (LASIX) 20 MG tablet Take 1 tablet (20 mg total) by mouth daily as needed for fluid or edema.  . hyoscyamine (LEVSIN SL) 0.125 MG SL tablet PLACE 2 TABLETS (0.25 MG TOTAL) UNDER THE TONGUE EVERY 4 (FOUR) HOURS AS NEEDED (SPASMS).  . ibandronate (BONIVA) 3 MG/3ML SOLN injection Inject 3 mLs (3 mg total) into the vein every 3 (three) months.  . isosorbide mononitrate (IMDUR) 30 MG 24 hr tablet Take 0.5 tablets (15 mg total) by mouth daily.  Marland Kitchen MYRBETRIQ 50 MG TB24 tablet Take 50 mg by mouth daily.  . nitroGLYCERIN (NITROSTAT) 0.4 MG SL tablet Place 1 tablet (0.4 mg total) under the tongue every 5 (five) minutes as needed (chest pain).  . polyethylene glycol powder (GLYCOLAX/MIRALAX) powder Take 17 g by mouth daily as needed for moderate constipation.  . propranolol (INDERAL) 10 MG tablet TAKE 1 TABLET BY MOUTH 3 TIMES DAILY AS NEEDED (TREMORS).  Marland Kitchen rosuvastatin (CRESTOR) 20 MG tablet Take 0.5 tablets (10 mg total) by mouth daily.  . sertraline (ZOLOFT) 25 MG tablet TAKE 1 TABLET (25 MG TOTAL) BY MOUTH DAILY.  . SYNTHROID 112 MCG tablet TAKE 1 TABLET (112 MCG TOTAL) BY MOUTH DAILY BEFORE BREAKFAST.  No current facility-administered medications on file prior to visit.     Review of Systems Per HPI unless specifically indicated in ROS section     Objective:    BP 138/82   Pulse 72   Temp 97.7 F (36.5 C) (Oral)   Wt Readings from Last 3 Encounters:  08/17/16 150 lb (68 kg)  08/03/16 155 lb (70.3 kg)  03/17/16 145 lb (65.8 kg)    Physical Exam  Constitutional: She appears well-developed and well-nourished. No distress.  HENT:  Mouth/Throat: Oropharynx is clear and moist. No oropharyngeal exudate.  Neck: No thyromegaly present.  Limited  ROM entire neck No pain to palpation midline cervical spine or significant discomfort at paracervical neck muscles  Cardiovascular: Normal rate, regular rhythm, normal heart sounds and intact distal pulses.   No murmur heard. Pulmonary/Chest: Effort normal and breath sounds normal. No respiratory distress. She has no wheezes. She has no rales.  Musculoskeletal: She exhibits no edema.  Stooped posture  Lymphadenopathy:    She has no cervical adenopathy.  Nursing note and vitals reviewed.  Results for orders placed or performed in visit on 12/07/16  POCT Urinalysis Dipstick (Automated)  Result Value Ref Range   Color, UA Straw    Clarity, UA Clear    Glucose, UA Negative    Bilirubin, UA Negative    Ketones, UA Negative    Spec Grav, UA 1.015    Blood, UA Negative    pH, UA 7.0    Protein, UA Negative    Urobilinogen, UA 0.2    Nitrite, UA Negative    Leukocytes, UA Negative Negative      Assessment & Plan:   Problem List Items Addressed This Visit    Chills - Primary    No signs of infection today. UA clear today. Anticipate MSA related.       Relevant Orders   POCT Urinalysis Dipstick (Automated) (Completed)   Multiple system atrophy ([name] [name])    Reviewed temperature dysregulation from autonomic instability as sign of MSA. Discussed management options - dressing in layers, using beverages to help regulate temperature.  UA normal today.       Neck pain    Benign exam - anticipate muscle strain/spasm from poor posture. Discussed baclofen and heating pad use.  Consider cervical films if ongoing.           Follow up plan: Return if symptoms worsen or fail to improve.  Ria Bush, MD

## 2016-12-07 NOTE — Patient Instructions (Signed)
I think chills are part of MSA - use layers and use drinks (hot vs cold) to help control your temperature.  For neck pain - likely muscle spasm. Treat with baclofen as needed and heating pad.

## 2016-12-07 NOTE — Telephone Encounter (Signed)
Please provide a letter to PCP re: B12 deficiency and B12 injections.  Patient has a history of vitamin B12 deficiency for years and has been receiving regular B12 injections at our office. She would benefit from ongoing regular B12 injections and is requesting to transition for injections to her PCP's office for convenience/logistical reasons. If Dr. Danise Mina is agreeable, we would appreciate if pt receives her injection at their office.  We can fax letter to the office of if needed patient can pick up letter when ready.

## 2016-12-07 NOTE — Assessment & Plan Note (Addendum)
Benign exam - anticipate muscle strain/spasm from poor posture. Discussed baclofen and heating pad use.  Consider cervical films if ongoing.

## 2016-12-07 NOTE — Assessment & Plan Note (Signed)
No signs of infection today. UA clear today. Anticipate MSA related.

## 2016-12-07 NOTE — Assessment & Plan Note (Addendum)
Reviewed temperature dysregulation from autonomic instability as sign of MSA. Discussed management options - dressing in layers, using beverages to help regulate temperature.  UA normal today.

## 2016-12-10 NOTE — Telephone Encounter (Signed)
Letter has been faxed in today

## 2016-12-11 ENCOUNTER — Telehealth: Payer: Self-pay | Admitting: Family Medicine

## 2016-12-11 NOTE — Telephone Encounter (Signed)
Received letter from Dr Rexene Alberts requesting we take over B12 shots. Ok to do here. plz call and schedule when next due here and continue monthly.  I've cc'd Dr Rexene Alberts as Juluis Rainier.

## 2016-12-13 ENCOUNTER — Other Ambulatory Visit: Payer: Self-pay | Admitting: Family Medicine

## 2016-12-14 NOTE — Telephone Encounter (Signed)
Rx called in as directed.   

## 2016-12-14 NOTE — Telephone Encounter (Signed)
plz phone in. 

## 2016-12-16 ENCOUNTER — Encounter: Payer: Self-pay | Admitting: Cardiovascular Disease

## 2016-12-16 ENCOUNTER — Ambulatory Visit (INDEPENDENT_AMBULATORY_CARE_PROVIDER_SITE_OTHER): Payer: Medicare Other | Admitting: Cardiovascular Disease

## 2016-12-16 VITALS — BP 120/68 | HR 70 | Ht 62.5 in | Wt 145.0 lb

## 2016-12-16 DIAGNOSIS — I2510 Atherosclerotic heart disease of native coronary artery without angina pectoris: Secondary | ICD-10-CM

## 2016-12-16 NOTE — Patient Instructions (Signed)
Medication Instructions:  Your physician recommends that you continue on your current medications as directed. Please refer to the Current Medication list given to you today.  Labwork: No new orders.   Testing/Procedures: No new orders.   Follow-Up: Your physician wants you to follow-up in: 6 MONTHS with Dr Burt Knack.  You will receive a reminder letter in the mail two months in advance. If you don't receive a letter, please call our office to schedule the follow-up appointment.   Any Other Special Instructions Will Be Listed Below (If Applicable).     If you need a refill on your cardiac medications before your next appointment, please call your pharmacy.

## 2016-12-16 NOTE — Progress Notes (Signed)
Cardiology Office Note Date:  12/16/2016   ID:  [name] [name], DOB 11-14-1940, MRN [id number]  PCP:  Ria Bush, MD  Cardiologist:  [name], MD    Chief Complaint  Patient presents with  . Coronary Artery Disease     History of Present Illness: [name] [name] is a 76 y.o. female who presents for followup evaluation. She is followed for coronary artery disease. 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 She is wheelchair bound.   The patient has had some pains on the left side of her neck. Thinks it's related to muscular issues but wants to make sure not heart-related. No CP, dypsnea. No orthopnea or PND. She has leg swelling which is chronic.    Past Medical History:  Diagnosis Date  . Asthma   . CAD (coronary artery disease)    myoview 5/08:  EF 76%, no scar, no ischemia, +ECG changes with exercise;  cath 04/08/07:  pLAD 30%, mLAD 60-70% - med tx.  . Chronic cystitis    recurrent UTIs, started bactrim ppx (McDiarmid)  . Depression   . Familial tremor    followed by Dr. Erling Cruz  . GERD (gastroesophageal reflux disease)    s/p nissen  . History of pneumonia   . HLD (hyperlipidemia)   . Hydronephrosis, bilateral   . Hypothyroidism   . Internal hemorrhoids   . Multiple system atrophy [facility])    [facility], now [name]  . Neurogenic bladder    Tannenbaum/MacDiarmid  . Osteoporosis 2016   DEXA T-4.4 spine deteriorated since 2012  . Unspecified chronic bronchitis   . VAGINITIS, ATROPHIC 11/07/2007    Past Surgical History:  Procedure Laterality Date  . COLONOSCOPY N/A 08/29/2013   int hemm; Inda Castle, MD  . CYSTECTOMY    . DEXA  12/2014   T -4.4 spine  . ESOPHAGOGASTRODUODENOSCOPY N/A 01/09/2015   Procedure: ESOPHAGOGASTRODUODENOSCOPY (EGD);  Surgeon: Inda Castle, MD;  Location: [facility];  Service: Endoscopy;  Laterality: N/A;  help with transfers  . LAPAROSCOPIC NISSEN  FUNDOPLICATION    . TUBAL LIGATION    . TUMOR REMOVAL      Current Outpatient Prescriptions  Medication Sig Dispense Refill  . albuterol (PROAIR HFA) 108 (90 Base) MCG/ACT inhaler Inhale 2 puffs into the lungs every 6 (six) hours as needed for wheezing or shortness of breath. Reported on 01/22/2016 1 Inhaler 0  . aspirin (ASPIRIN EC) 81 MG EC tablet Take 81 mg by mouth 2 (two) times a week. Swallow whole. Take at bedtime    . baclofen (LIORESAL) 10 MG tablet Take 1 tablet (10 mg total) by mouth at bedtime as needed for muscle spasms. 30 each 5  . Cholecalciferol (VITAMIN D3) 50000 units TABS Take 1 tablet by mouth once a week. 12 tablet 1  . clonazePAM (KLONOPIN) 0.5 MG tablet TAKE 1-2 TABLETS BY MOUTH DAILY AS NEEDED 60 tablet 3  . Cranberry (ELLURA) 200 MG CAPS Take 1 capsule by mouth every evening.     . cyanocobalamin (,VITAMIN B-12,) 1000 MCG/ML injection Inject 1,000 mcg into the muscle every 30 (thirty) days. Take the first of the month    . dexlansoprazole (DEXILANT) 60 MG capsule Take 1 capsule (60 mg total) by mouth daily. 30 capsule 1  . famotidine (PEPCID) 40 MG tablet TAKE 1 TABLET (40 MG TOTAL) BY MOUTH 2 (TWO) TIMES DAILY AS NEEDED FOR HEARTBURN. 180 tablet 1  . furosemide (LASIX)  20 MG tablet Take 1 tablet (20 mg total) by mouth daily as needed for fluid or edema. 30 tablet 0  . hyoscyamine (LEVSIN SL) 0.125 MG SL tablet PLACE 2 TABLETS (0.25 MG TOTAL) UNDER THE TONGUE EVERY 4 (FOUR) HOURS AS NEEDED (SPASMS). 30 tablet 5  . ibandronate (BONIVA) 3 MG/3ML SOLN injection Inject 3 mLs (3 mg total) into the vein every 3 (three) months.    . isosorbide mononitrate (IMDUR) 30 MG 24 hr tablet Take 0.5 tablets (15 mg total) by mouth daily. 30 tablet 5  . MYRBETRIQ 50 MG TB24 tablet Take 50 mg by mouth daily.    . nitroGLYCERIN (NITROSTAT) 0.4 MG SL tablet Place 1 tablet (0.4 mg total) under the tongue every 5 (five) minutes as needed (chest pain). 25 tablet 3  . polyethylene glycol powder  (GLYCOLAX/MIRALAX) powder Take 17 g by mouth daily as needed for moderate constipation. 250 g 1  . propranolol (INDERAL) 10 MG tablet TAKE 1 TABLET BY MOUTH 3 TIMES DAILY AS NEEDED (TREMORS). 180 tablet 1  . rosuvastatin (CRESTOR) 20 MG tablet Take 0.5 tablets (10 mg total) by mouth daily. 45 tablet 3  . sertraline (ZOLOFT) 25 MG tablet TAKE 1 TABLET (25 MG TOTAL) BY MOUTH DAILY. 90 tablet 3  . SYNTHROID 112 MCG tablet TAKE 1 TABLET (112 MCG TOTAL) BY MOUTH DAILY BEFORE BREAKFAST. 30 tablet 6   No current facility-administered medications for this visit.     Allergies:   Keflex [cephalexin]   Social History:  The patient  reports that she has never smoked. She has never used smokeless tobacco. She reports that she does not drink alcohol or use drugs.   Family History:  The patient's  family history includes Breast cancer in her maternal aunt; Colon cancer in her maternal aunt; Coronary artery disease in her daughter and father; Diabetes in her daughter; Heart attack in her daughter and father; Heart disease in her father; Hypertension in her mother; Multiple sclerosis in her daughter; Stroke in her daughter.    ROS:  Please see the history of present illness.  Otherwise, review of systems is positive for chills, hearing loss, muscle pain, fatigue, constipation.  All other systems are reviewed and negative.    PHYSICAL EXAM: VS:  BP 120/68   Pulse 70   Ht 5' 2.5" (1.588 m)   Wt 145 lb (65.8 kg)   BMI 26.10 kg/m  , BMI Body mass index is 26.1 kg/m. GEN: pleasant elderly woman in wheelchair, in no acute distress  HEENT: normal  Neck: no JVD, no masses. No carotid bruits. No point tenderness around left neck Cardiac: RRR without murmur or gallop                Respiratory:  clear to auscultation bilaterally, normal work of breathing GI: soft, nontender, nondistended, + BS MS: no deformity or atrophy  Ext: 1+ bilateral pretibial edema, pedal pulses 2+= bilaterally Skin: warm and dry, no  rash Neuro:  Strength and sensation are intact Psych: euthymic mood, full affect  EKG:  EKG is ordered today. The ekg ordered today shows NSR 71 bpm, within normal limits  Recent Labs: 02/04/2016: Pro B Natriuretic peptide (BNP) 73.0 03/17/2016: Hemoglobin 13.3; Platelets 249 07/28/2016: ALT 14; BUN 13; Creatinine, Ser 0.72; Potassium 4.0; Sodium 141; TSH 0.92   Lipid Panel     Component Value Date/Time   CHOL 122 07/28/2016 1430   TRIG 96.0 07/28/2016 1430   HDL 46.50 07/28/2016 1430  CHOLHDL 3 07/28/2016 1430   VLDL 19.2 07/28/2016 1430   LDLCALC 56 07/28/2016 1430   LDLDIRECT 161.5 06/09/2010 1517      Wt Readings from Last 3 Encounters:  12/16/16 145 lb (65.8 kg)  08/17/16 150 lb (68 kg)  08/03/16 155 lb (70.3 kg)     ASSESSMENT AND PLAN: 1.  CAD, native vessel: pt without angina. She is treated with ASA, statin, imdur, and a beta-blocker. Will continue her same medications.   2. Hypertension: BP well-controlled.   3. Hyperlipidemia: LDL at goal <70 - September 2017 labs reviewed.   Current medicines are reviewed with the patient today.  The patient does not have concerns regarding medicines.  Labs/ tests ordered today include:  No orders of the defined types were placed in this encounter.   Disposition:   FU 6 months  Signed, [name], MD  12/16/2016 3:51 PM    [facility] [address], [place], [place]  [address] Phone: [phone]; Fax: [phone]

## 2016-12-18 NOTE — Telephone Encounter (Signed)
Patient notified and appt scheduled.

## 2016-12-21 DIAGNOSIS — G239 Degenerative disease of basal ganglia, unspecified: Secondary | ICD-10-CM | POA: Diagnosis not present

## 2016-12-22 ENCOUNTER — Telehealth: Payer: Self-pay

## 2016-12-22 NOTE — Telephone Encounter (Signed)
Rx written and in Kim's box. 

## 2016-12-22 NOTE — Telephone Encounter (Signed)
Patient's husband notified and Rx placed up front for pick up.

## 2016-12-22 NOTE — Telephone Encounter (Signed)
Left note requesting prescription for lift chair; pt wants to purchase lift chair and then file for medicare.last annual 08/03/16 and last acute 12/07/16.

## 2016-12-24 ENCOUNTER — Ambulatory Visit: Payer: Medicare Other

## 2016-12-29 ENCOUNTER — Ambulatory Visit: Payer: Medicare Other

## 2017-01-09 ENCOUNTER — Other Ambulatory Visit: Payer: Self-pay | Admitting: Family Medicine

## 2017-01-09 ENCOUNTER — Other Ambulatory Visit: Payer: Self-pay | Admitting: Cardiovascular Disease

## 2017-01-11 ENCOUNTER — Ambulatory Visit (INDEPENDENT_AMBULATORY_CARE_PROVIDER_SITE_OTHER): Payer: Medicare Other | Admitting: Internal Medicine

## 2017-01-11 ENCOUNTER — Encounter: Payer: Self-pay | Admitting: Internal Medicine

## 2017-01-11 VITALS — BP 128/60 | HR 80 | Ht 62.5 in

## 2017-01-11 DIAGNOSIS — K219 Gastro-esophageal reflux disease without esophagitis: Secondary | ICD-10-CM | POA: Diagnosis not present

## 2017-01-11 DIAGNOSIS — K5900 Constipation, unspecified: Secondary | ICD-10-CM | POA: Diagnosis not present

## 2017-01-11 DIAGNOSIS — K581 Irritable bowel syndrome with constipation: Secondary | ICD-10-CM

## 2017-01-11 NOTE — Progress Notes (Signed)
   Subjective:    Patient ID: [name] [name], female    DOB: September 16, 1941, 76 y.o.   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 She is here with her husband today. She was last seen one year ago.  She reports that on the whole she has been doing well. She has continued Dexilant 60 mg in the morning. Famotidine is on her med list though she is not sure she is using it recently. She's had some nocturnal regurgitation and a "bad taste" in her mouth which she notices on waking throughout the night. She denies trouble swallowing. No nausea or vomiting. Appetite has remained good. She sleeps often times up right due to her neurologic condition. She denies abdominal pain. Bowel movements have been regular though at times she can feel constipated. MiraLAX is on her med list but she uses this rarely recently. She does occasionally have abdominal spasm which responds very well to Levsin.   Review of Systems As per HPI, otherwise negative  Current Medications, Allergies, Past Medical History, Past Surgical History, Family History and Social History were reviewed in [employer] record.     Objective:   Physical Exam BP 128/60   Pulse 80   Ht 5' 2.5" (1.588 m)  Constitutional: Well-developed and well-nourished. No distress. HEENT: Normocephalic and atraumatic.Conjunctivae are normal.  No scleral icterus. Neck: Neck supple. Trachea midline. Cardiovascular: Normal rate, regular rhythm and intact distal pulses.  Pulmonary/chest: Effort normal and breath sounds normal. No wheezing, rales or rhonchi. Abdominal: Soft, nontender, nondistended. Bowel sounds active throughout.  Extremities: no clubbing, cyanosis, or edema Neurological: Alert and oriented to person place and time.  Tremor. Skin: Skin is warm  and dry.  Psychiatric: Normal mood and affect. Behavior is normal.  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  1. GERD -- Dexilant has been added since last visit. It has been working well particularly the daytime. Some breakthrough nocturnal reflux. I recommended she add Pepcid 40 mg each evening. Continued Dexilant 60 mg in the morning before breakfast.  2. IBS -- long history with intermittent cramping. Well controlled with as needed Levsin. Continue current dose on an as-needed basis  3. Constipation -- less of an issue than in years past though still intermittently a problem. I recommended schedule MiraLAX 17 g Monday, Wednesday and Friday which can be titrated to effect.  4. CRC screening -- negative colonoscopy 2014. Colonoscopy to be discontinued due to age  Annual follow-up, sooner if necessary 15 minutes spent with the patient today. Greater than 50% was spent in counseling and coordination of care with the patient

## 2017-01-11 NOTE — Telephone Encounter (Signed)
Medication Detail    Disp Refills Start End   isosorbide mononitrate (IMDUR) 30 MG 24 hr tablet 30 tablet 5 11/12/2016    Sig - Route: Take 0.5 tablets (15 mg total) by mouth daily. - Oral   E-Prescribing Status: Receipt confirmed by pharmacy (11/12/2016 11:07 AM EST)   Pharmacy   [id number] [place]

## 2017-01-11 NOTE — Patient Instructions (Signed)
Take Dexilant 60 mg every morning.  Take Pepcid 40 mg every evening.  Please take Miralax 1 capful (17 grams) on Mondays, Wednesdays, Fridays. You may titrate as needed.  Please take Levsin as needed for abdominal spasms.  Please follow up with Dr Hilarie Fredrickson in 1 year.  If you are age 77 or older, your body mass index should be between 23-30. Your There is no height or weight on file to calculate BMI. If this is out of the aforementioned range listed, please consider follow up with your Primary Care Provider.  If you are age 30 or younger, your body mass index should be between 19-25. Your There is no height or weight on file to calculate BMI. If this is out of the aformentioned range listed, please consider follow up with your Primary Care Provider.

## 2017-01-12 ENCOUNTER — Other Ambulatory Visit: Payer: Self-pay | Admitting: Cardiovascular Disease

## 2017-01-31 ENCOUNTER — Other Ambulatory Visit: Payer: Self-pay | Admitting: Family Medicine

## 2017-02-06 ENCOUNTER — Other Ambulatory Visit: Payer: Self-pay | Admitting: Family Medicine

## 2017-02-10 DIAGNOSIS — N319 Neuromuscular dysfunction of bladder, unspecified: Secondary | ICD-10-CM | POA: Diagnosis not present

## 2017-02-26 ENCOUNTER — Other Ambulatory Visit: Payer: Self-pay | Admitting: Family Medicine

## 2017-03-07 ENCOUNTER — Encounter (HOSPITAL_COMMUNITY): Payer: Self-pay

## 2017-03-07 ENCOUNTER — Emergency Department (HOSPITAL_COMMUNITY)
Admission: EM | Admit: 2017-03-07 | Discharge: 2017-03-07 | Disposition: A | Discharge status: Home / Self Care | Payer: Medicare Other | Attending: Emergency Medicine | Admitting: Emergency Medicine

## 2017-03-07 ENCOUNTER — Emergency Department (HOSPITAL_COMMUNITY): Payer: Medicare Other

## 2017-03-07 DIAGNOSIS — Y939 Activity, unspecified: Secondary | ICD-10-CM | POA: Insufficient documentation

## 2017-03-07 DIAGNOSIS — S0990XA Unspecified injury of head, initial encounter: Secondary | ICD-10-CM

## 2017-03-07 DIAGNOSIS — I6789 Other cerebrovascular disease: Secondary | ICD-10-CM | POA: Diagnosis not present

## 2017-03-07 DIAGNOSIS — R251 Tremor, unspecified: Secondary | ICD-10-CM | POA: Diagnosis not present

## 2017-03-07 DIAGNOSIS — Z7982 Long term (current) use of aspirin: Secondary | ICD-10-CM | POA: Insufficient documentation

## 2017-03-07 DIAGNOSIS — Y92129 Unspecified place in nursing home as the place of occurrence of the external cause: Secondary | ICD-10-CM | POA: Insufficient documentation

## 2017-03-07 DIAGNOSIS — Y999 Unspecified external cause status: Secondary | ICD-10-CM | POA: Diagnosis not present

## 2017-03-07 DIAGNOSIS — S01511A Laceration without foreign body of lip, initial encounter: Secondary | ICD-10-CM | POA: Diagnosis not present

## 2017-03-07 DIAGNOSIS — W19XXXA Unspecified fall, initial encounter: Secondary | ICD-10-CM

## 2017-03-07 DIAGNOSIS — E039 Hypothyroidism, unspecified: Secondary | ICD-10-CM | POA: Insufficient documentation

## 2017-03-07 DIAGNOSIS — Z79899 Other long term (current) drug therapy: Secondary | ICD-10-CM | POA: Diagnosis not present

## 2017-03-07 DIAGNOSIS — S0181XA Laceration without foreign body of other part of head, initial encounter: Secondary | ICD-10-CM | POA: Diagnosis not present

## 2017-03-07 DIAGNOSIS — J45909 Unspecified asthma, uncomplicated: Secondary | ICD-10-CM | POA: Diagnosis not present

## 2017-03-07 DIAGNOSIS — S199XXA Unspecified injury of neck, initial encounter: Secondary | ICD-10-CM | POA: Diagnosis not present

## 2017-03-07 DIAGNOSIS — I2510 Atherosclerotic heart disease of native coronary artery without angina pectoris: Secondary | ICD-10-CM | POA: Diagnosis not present

## 2017-03-07 DIAGNOSIS — W1839XA Other fall on same level, initial encounter: Secondary | ICD-10-CM | POA: Insufficient documentation

## 2017-03-07 NOTE — ED Notes (Signed)
Face wound care done at this time. Small abrasion noted to upper lip. Dried blood noted to nostrils.

## 2017-03-07 NOTE — ED Notes (Signed)
Husband wanting to know why CT taking so long. CT contacted and states that patient is 2nd in line to get CT done. Patient updated at this time.

## 2017-03-07 NOTE — ED Triage Notes (Signed)
Pt. Coming from nursing facility where she was visiting her mother. Pt. Wheel chair bound due to multiple system atrophy. Pt. States her right foot locked up on her and she fell forward onto her face. Bruising noted to left face is from fall last week, which her husband wouldn't let her go to the hospital for (per EMS). Pt. Knot noted to right forehead from today's fall. Pt. Also has bleeding from her lip. Pt. States her pain is 2/10. Pt. Aox4. Pt. Also has a tremor.

## 2017-03-07 NOTE — ED Provider Notes (Signed)
[facility] DEPT Provider Note   CSN: [id number] Arrival date & time: 03/07/17  1453     History   Chief Complaint Chief Complaint  Patient presents with  . Fall    HPI [name] [name] is a 76 y.o. female. Patient fell, when standing and transferring today, from her wheelchair.  She had been visiting her mother at the nursing home, when she fell.  She states that her right foot "locked up", causing her to fall.  This is happened many times in the past and when it does she is unable to move the foot, and has had falls previously, when this happened.  She did not lose consciousness.  There was no prodrome.  She was able to be helped up, and came here by private vehicle, for evaluation.  She denies recent fever, chills, nausea, vomiting, new weakness, or paresthesias.  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  HPI  Past Medical History:  Diagnosis Date  . Asthma   . CAD (coronary artery disease)    myoview 5/08:  EF 76%, no scar, no ischemia, +ECG changes with exercise;  cath 04/08/07:  pLAD 30%, mLAD 60-70% - med tx.  . Chronic cystitis    recurrent UTIs, started bactrim ppx (McDiarmid)  . Depression   . Familial tremor    followed by Dr. Erling Cruz  . GERD (gastroesophageal reflux disease)    s/p nissen  . History of pneumonia   . HLD (hyperlipidemia)   . Hydronephrosis, bilateral   . Hypothyroidism   . Internal hemorrhoids   . 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 Neurogenic bladder    Tannenbaum/MacDiarmid  . Osteoporosis 2016   DEXA T-4.4 spine deteriorated since 2012  . Unspecified chronic bronchitis ([place])   . VAGINITIS, ATROPHIC 11/07/2007    Patient Active Problem List   Diagnosis Date Noted  . Neck pain 10/06/2016  . General weakness 02/26/2016  . Chills 02/26/2016  . Other fatigue 02/04/2016  . Neurogenic bladder   . Chronic cystitis 04/18/2015  . Pedal edema 04/01/2015  .  Constipation 12/14/2014  . Medicare annual wellness visit, subsequent 11/27/2014  . Advanced care planning/counseling discussion 11/27/2014  . Osteoporosis   . Vitamin B12 deficiency 06/07/2014  . Recurrent falls 02/06/2014  . Multiple system atrophy ([place])   . Dysphagia 07/28/2013  . Shoulder pain, left 07/20/2012  . 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 Health maintenance examination 08/26/2011  . Murmur 05/27/2011  . HYPERCHOLESTEROLEMIA  IIA 05/14/2010  . VENOUS INSUFFICIENCY, LEGS 06/21/2009  . CAD (coronary artery disease) 06/09/2008  . Hypothyroidism 05/15/2008  . ASTHMA 05/15/2008  . Depression 11/07/2007  . 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 GERD 11/07/2007  . VAGINITIS, ATROPHIC 11/07/2007    Past Surgical History:  Procedure Laterality Date  . COLONOSCOPY N/A 08/29/2013   int hemm; Inda Castle, MD  . CYSTECTOMY    . DEXA  12/2014   T -4.4 spine  . ESOPHAGOGASTRODUODENOSCOPY N/A 01/09/2015   Procedure: ESOPHAGOGASTRODUODENOSCOPY (EGD);  Surgeon: Inda Castle, MD;  Location: [facility];  Service: Endoscopy;  Laterality: N/A;  help with transfers  . LAPAROSCOPIC NISSEN FUNDOPLICATION    . TUBAL LIGATION    . TUMOR REMOVAL      OB History    No data available       Home Medications    Prior to Admission medications   Medication Sig Start Date End Date Taking?  Authorizing Provider  albuterol (PROAIR HFA) 108 (90 Base) MCG/ACT inhaler Inhale 2 puffs into the lungs every 6 (six) hours as needed for wheezing or shortness of breath. Reported on 01/22/2016 11/06/16  Yes Ria Bush, MD  aspirin (ASPIRIN EC) 81 MG EC tablet Take 81 mg by mouth 2 (two) times a week. Swallow whole. Take at bedtime   Yes Historical Provider, MD  baclofen (LIORESAL) 10 MG tablet Take 1 tablet (10 mg total) by mouth at bedtime as needed for muscle spasms. 10/19/16  Yes Star Age, MD  Cholecalciferol (VITAMIN D3) 50000 units CAPS TAKE 1 CAPSULE BY MOUTH ONCE A WEEK 02/26/17  Yes  Ria Bush, MD  clonazePAM (KLONOPIN) 0.5 MG tablet TAKE 1-2 TABLETS BY MOUTH DAILY AS NEEDED Patient taking differently: TAKE 1-2 TABLETS BY MOUTH DAILY AS NEEDED FOR ANXIETY 12/14/16  Yes Ria Bush, MD  Cranberry (ELLURA) 200 MG CAPS Take 1 capsule by mouth every evening.    Yes Historical Provider, MD  cyanocobalamin (,VITAMIN B-12,) 1000 MCG/ML injection Inject 1,000 mcg into the muscle every 30 (thirty) days. Take the first of the month   Yes Historical Provider, MD  dexlansoprazole (DEXILANT) 60 MG capsule Take 1 capsule (60 mg total) by mouth daily. 11/16/16  Yes Jerene Bears, MD  famotidine (PEPCID) 40 MG tablet TAKE 1 TABLET (40 MG TOTAL) BY MOUTH 2 (TWO) TIMES DAILY AS NEEDED FOR HEARTBURN. 01/10/17  Yes Ria Bush, MD  furosemide (LASIX) 20 MG tablet Take 1 tablet (20 mg total) by mouth daily as needed for fluid or edema. 04/04/15  Yes Ria Bush, MD  hyoscyamine (LEVSIN SL) 0.125 MG SL tablet PLACE 2 TABLETS (0.25 MG TOTAL) UNDER THE TONGUE EVERY 4 (FOUR) HOURS AS NEEDED (SPASMS). 12/23/15  Yes Jerene Bears, MD  isosorbide mononitrate (IMDUR) 30 MG 24 hr tablet Take 0.5 tablets (15 mg total) by mouth daily. 11/12/16  Yes [name], MD  MYRBETRIQ 50 MG TB24 tablet Take 50 mg by mouth daily. 03/25/16  Yes Historical Provider, MD  nitroGLYCERIN (NITROSTAT) 0.4 MG SL tablet Place 1 tablet (0.4 mg total) under the tongue every 5 (five) minutes as needed (chest pain). 08/07/15  Yes [name], MD  polyethylene glycol powder (GLYCOLAX/MIRALAX) powder Take 17 g by mouth daily as needed for moderate constipation. 04/18/15  Yes Ria Bush, MD  propranolol (INDERAL) 10 MG tablet TAKE 1 TABLET BY MOUTH 3 TIMES DAILY AS NEEDED (TREMORS). 02/08/17  Yes Abner Greenspan, MD  rosuvastatin (CRESTOR) 20 MG tablet Take 0.5 tablets (10 mg total) by mouth daily. 11/06/16  Yes [name], MD  sertraline (ZOLOFT) 25 MG tablet TAKE 1 TABLET (25 MG TOTAL) BY MOUTH DAILY. 11/17/16  Yes Ria Bush, MD  SYNTHROID 112 MCG tablet TAKE 1 TABLET (112 MCG TOTAL) BY MOUTH DAILY BEFORE BREAKFAST. 02/01/17  Yes Ria Bush, MD  trimethoprim (TRIMPEX) 100 MG tablet Take 100 mg by mouth daily. 02/10/17  Yes Historical Provider, MD  isosorbide mononitrate (IMDUR) 30 MG 24 hr tablet TAKE 1/2 TABLET BY MOUTH DAILY Patient not taking: Reported on 03/07/2017 01/12/17   [name], MD    Family History Family History  Problem Relation Age of Onset  . Hypertension Mother   . Coronary artery disease Father   . Heart attack Father   . Heart disease Father   . Diabetes Daughter   . Breast cancer Maternal Aunt   . Coronary artery disease Daughter   . Colon cancer Maternal Aunt   .  Multiple sclerosis Daughter   . Stroke Daughter   . Heart attack Daughter     Social History Social History  Substance Use Topics  . Smoking status: Never Smoker  . Smokeless tobacco: Never Used  . Alcohol use No     Allergies   Keflex [cephalexin]   Review of Systems Review of Systems  All other systems reviewed and are negative.    Physical Exam Updated Vital Signs BP (!) 149/70   Pulse 79   Temp 97.7 F (36.5 C) (Oral)   Resp 16   Ht 5' 2"  (1.575 m)   Wt 145 lb (65.8 kg)   SpO2 100%   BMI 26.52 kg/m   Physical Exam  Constitutional: She is oriented to person, place, and time. She appears well-developed and well-nourished.  HENT:  Head: Normocephalic.  Contusion right forehead, without associated abrasion or laceration.  No periorbital bruising or crepitation.  No midface instability or pain.  Superficial upper lip laceration, in the midline.  No trismus.  No apparent dental injury.  Eyes: Conjunctivae and EOM are normal. Pupils are equal, round, and reactive to light.  Neck: Normal range of motion and phonation normal. Neck supple.  Cardiovascular: Normal rate and regular rhythm.   Pulmonary/Chest: Effort normal and breath sounds normal. She exhibits no tenderness.  Abdominal:  Soft. She exhibits no distension. There is no tenderness. There is no guarding.  Musculoskeletal: Normal range of motion.  Neurological: She is alert and oriented to person, place, and time. She exhibits normal muscle tone.  Skin: Skin is warm and dry.  Psychiatric: She has a normal mood and affect. Her behavior is normal.  Nursing note and vitals reviewed.    ED Treatments / Results  Labs (all labs ordered are listed, but only abnormal results are displayed) Labs Reviewed - No data to display  EKG  EKG Interpretation None       Radiology No results found.  Procedures Procedures (including critical care time)  Medications Ordered in ED Medications - No data to display   Initial Impression / Assessment and Plan / ED Course  I have reviewed the triage vital signs and the nursing notes.  Pertinent labs & imaging results that were available during my care of the patient were reviewed by me and considered in my medical decision making (see chart for details).  Clinical Course as of Mar 11 744  Sun Mar 07, 2017  1525 Last tetanus booster given July 2009  [EW]    Clinical Course User Index [EW] Daleen Bo, MD    Medications - No data to display  No data found.    At D/C Reevaluation with update and discussion. After initial assessment and treatment, an updated evaluation reveals no further complaints.  Findings discussed with patient, all questions were answered. Izayiah Tibbitts L    Final Clinical Impressions(s) / ED Diagnoses   Final diagnoses:  Fall, initial encounter  Injury of head, initial encounter  Facial laceration, initial encounter    Fall with minor injuries.  Doubt intracranial bleeding, facial fracture, neck or back injury.  Nursing Notes Reviewed/ Care Coordinated Applicable Imaging Reviewed Interpretation of Laboratory Data incorporated into ED treatment  The patient appears reasonably screened and/or stabilized for discharge and I doubt  any other medical condition or other [facility] requiring further screening, evaluation, or treatment in the ED at this time prior to discharge.  Plan: Home Medications- APAP, continue usual medications; Home Treatments-rest, wound care; return here if the recommended treatment,  does not improve the symptoms; Recommended follow up-PCP checkup in 1 week.   New Prescriptions Discharge Medication List as of 03/07/2017  7:22 PM       Daleen Bo, MD 03/11/17 [phone]

## 2017-03-07 NOTE — Discharge Instructions (Signed)
Keep the facial wounds clean with soap and water once or twice a day.  After cleansing of apply a topical antibiotic such as Neosporin or bacitracin.  Use ice on the sore areas 3 or 4 times a day for 2 days.  Use Tylenol if needed, for pain.

## 2017-03-16 ENCOUNTER — Ambulatory Visit (INDEPENDENT_AMBULATORY_CARE_PROVIDER_SITE_OTHER): Payer: Medicare Other | Admitting: *Deleted

## 2017-03-16 DIAGNOSIS — E538 Deficiency of other specified B group vitamins: Secondary | ICD-10-CM

## 2017-03-16 MED ORDER — CYANOCOBALAMIN 1000 MCG/ML IJ SOLN
1000.0000 ug | Freq: Once | INTRAMUSCULAR | Status: AC
  Administered 2017-03-16: 1000 ug via INTRAMUSCULAR

## 2017-04-11 ENCOUNTER — Other Ambulatory Visit: Payer: Self-pay | Admitting: Family Medicine

## 2017-04-14 ENCOUNTER — Other Ambulatory Visit: Payer: Self-pay

## 2017-04-14 ENCOUNTER — Encounter (HOSPITAL_COMMUNITY): Payer: Self-pay | Admitting: Emergency Medicine

## 2017-04-14 ENCOUNTER — Telehealth: Payer: Self-pay

## 2017-04-14 ENCOUNTER — Emergency Department (HOSPITAL_COMMUNITY): Payer: Medicare Other

## 2017-04-14 ENCOUNTER — Emergency Department (HOSPITAL_COMMUNITY)
Admission: EM | Admit: 2017-04-14 | Discharge: 2017-04-14 | Disposition: A | Discharge status: Home / Self Care | Payer: Medicare Other | Attending: Emergency Medicine | Admitting: Emergency Medicine

## 2017-04-14 ENCOUNTER — Telehealth: Payer: Self-pay | Admitting: Family Medicine

## 2017-04-14 DIAGNOSIS — R4781 Slurred speech: Secondary | ICD-10-CM | POA: Diagnosis not present

## 2017-04-14 DIAGNOSIS — I2510 Atherosclerotic heart disease of native coronary artery without angina pectoris: Secondary | ICD-10-CM | POA: Insufficient documentation

## 2017-04-14 DIAGNOSIS — Z79899 Other long term (current) drug therapy: Secondary | ICD-10-CM | POA: Diagnosis not present

## 2017-04-14 DIAGNOSIS — N3090 Cystitis, unspecified without hematuria: Secondary | ICD-10-CM | POA: Insufficient documentation

## 2017-04-14 DIAGNOSIS — R479 Unspecified speech disturbances: Secondary | ICD-10-CM | POA: Diagnosis not present

## 2017-04-14 DIAGNOSIS — Z7982 Long term (current) use of aspirin: Secondary | ICD-10-CM | POA: Diagnosis not present

## 2017-04-14 DIAGNOSIS — G903 Multi-system degeneration of the autonomic nervous system: Secondary | ICD-10-CM | POA: Diagnosis not present

## 2017-04-14 DIAGNOSIS — G239 Degenerative disease of basal ganglia, unspecified: Secondary | ICD-10-CM

## 2017-04-14 DIAGNOSIS — E039 Hypothyroidism, unspecified: Secondary | ICD-10-CM | POA: Diagnosis not present

## 2017-04-14 DIAGNOSIS — R030 Elevated blood-pressure reading, without diagnosis of hypertension: Secondary | ICD-10-CM | POA: Diagnosis not present

## 2017-04-14 DIAGNOSIS — R4701 Aphasia: Secondary | ICD-10-CM | POA: Diagnosis present

## 2017-04-14 DIAGNOSIS — B9689 Other specified bacterial agents as the cause of diseases classified elsewhere: Secondary | ICD-10-CM | POA: Diagnosis not present

## 2017-04-14 LAB — CBC WITH DIFFERENTIAL/PLATELET
BASOS PCT: 0 %
Basophils Absolute: 0 10*3/uL (ref 0.0–0.1)
EOS ABS: 0.1 10*3/uL (ref 0.0–0.7)
EOS PCT: 2 %
HCT: 39.1 % (ref 36.0–46.0)
HEMOGLOBIN: 13 g/dL (ref 12.0–15.0)
Lymphocytes Relative: 40 %
Lymphs Abs: 2.1 10*3/uL (ref 0.7–4.0)
MCH: 29.1 pg (ref 26.0–34.0)
MCHC: 33.2 g/dL (ref 30.0–36.0)
MCV: 87.5 fL (ref 78.0–100.0)
Monocytes Absolute: 0.4 10*3/uL (ref 0.1–1.0)
Monocytes Relative: 7 %
NEUTROS PCT: 51 %
Neutro Abs: 2.7 10*3/uL (ref 1.7–7.7)
PLATELETS: 257 10*3/uL (ref 150–400)
RBC: 4.47 MIL/uL (ref 3.87–5.11)
RDW: 13.5 % (ref 11.5–15.5)
WBC: 5.3 10*3/uL (ref 4.0–10.5)

## 2017-04-14 LAB — URINALYSIS, ROUTINE W REFLEX MICROSCOPIC
Bacteria, UA: NONE SEEN
Bilirubin Urine: NEGATIVE
Glucose, UA: NEGATIVE mg/dL
KETONES UR: NEGATIVE mg/dL
Nitrite: POSITIVE — AB
PH: 7 (ref 5.0–8.0)
Protein, ur: NEGATIVE mg/dL
SPECIFIC GRAVITY, URINE: 1.008 (ref 1.005–1.030)
SQUAMOUS EPITHELIAL / LPF: NONE SEEN

## 2017-04-14 LAB — BASIC METABOLIC PANEL
Anion gap: 7 (ref 5–15)
BUN: 11 mg/dL (ref 6–20)
CALCIUM: 9.5 mg/dL (ref 8.9–10.3)
CO2: 26 mmol/L (ref 22–32)
CREATININE: 0.92 mg/dL (ref 0.44–1.00)
Chloride: 105 mmol/L (ref 101–111)
GFR calc non Af Amer: 59 mL/min — ABNORMAL LOW (ref >60)
Glucose, Bld: 139 mg/dL — ABNORMAL HIGH (ref 65–99)
Potassium: 4 mmol/L (ref 3.5–5.1)
SODIUM: 138 mmol/L (ref 135–145)

## 2017-04-14 LAB — PROTIME-INR
INR: 1.03
PROTHROMBIN TIME: 13.5 s (ref 11.4–15.2)

## 2017-04-14 LAB — I-STAT TROPONIN, ED: TROPONIN I, POC: 0 ng/mL (ref 0.00–0.08)

## 2017-04-14 MED ORDER — LORAZEPAM 2 MG/ML IJ SOLN
1.0000 mg | Freq: Once | INTRAMUSCULAR | Status: AC
  Administered 2017-04-14: 1 mg via INTRAVENOUS
  Filled 2017-04-14: qty 1

## 2017-04-14 MED ORDER — FOSFOMYCIN TROMETHAMINE 3 G PO PACK
3.0000 g | PACK | Freq: Once | ORAL | Status: AC
  Administered 2017-04-14: 3 g via ORAL
  Filled 2017-04-14: qty 3

## 2017-04-14 NOTE — ED Triage Notes (Addendum)
Pt in from home via [facility] with c/o slurred speech episode from 0300-0330 this morning. Pt denies any confusion, n/v or dizziness at time of episode. Pt stated her R leg was "locked up at the time, happens frequently".  Pt is baseline now per husband, a&ox4. Pt WC bound at home, hx of MSA x 5 yrs

## 2017-04-14 NOTE — Telephone Encounter (Signed)
See phone note 04/14/17.

## 2017-04-14 NOTE — Discharge Instructions (Signed)
You were given a single dose of antibiotic for your UTI. You do not need any more antibiotics. Please folllow-up closely with your neurologist on Monday. Return without fail for worsening symptoms, including fever, confusion, difficulty speaking or any other symptoms concerning ot you.

## 2017-04-14 NOTE — Telephone Encounter (Signed)
Mr Kowaleski did not want to be transferred to [facility]; No DPR signed for Mr Cothron; I spoke with pt; last night or early this morning 3AM pt woke up in recliner and called husband for assistance; pt's rt leg was worse than usual (pt has MSA), pt was having to slide the rt leg and the knee would not bend; pt speech was mixed up; pt was aware not saying what she wanted to say but pt had no control over speech; pt started yesterday with spasms when urinating and pts husband concerned could be UTI, pt does not have fever and BP fine this morning per pt. Today pt is talking in her normal way and walking as usual. pts husband said pt is weak. Recently neurologist started pt on muscle relaxer. Dr Darnell Level concerned possible stroke and recommends pt seen at ED. Pt and pts husband said will take pt to [facility] ED. If cannot get in car will call ambulance. FYI to Dr Darnell Level.

## 2017-04-14 NOTE — ED Provider Notes (Signed)
[facility] DEPT Provider Note   CSN: [id number] Arrival date & time: 04/14/17  1235     History   Chief Complaint Chief Complaint  Patient presents with  . Aphasia    HPI [name] [name] is a 76 y.o. female.  HPI Patient has history of Multiple System Atrophy. She is accustomed to problems with weakness and neurologic dysfunction. She however had an episode that was atypical last night. The patient's husband reports that for about 30-45 minutes she was having significant difficulty with her speech. She seemed "thick tongue". She was having a lot of trouble expressing her self. They report this is not typical of her baseline disorder. Yesterday evening he was able to get the patient to relax and go back to sleep. This morning, the symptoms were better. She seemed back to herself. They did contact the family physician. Her husband reports that he wanted to get her tested for urinary tract infection as he was aware of how significantly that might impact her. After reviewing last night symptoms with the PCP, they are referred to the emergency department for diagnostic evaluation. Patient reports at this time all of her symptoms are back to her baseline. Past Medical History:  Diagnosis Date  . Asthma   . CAD (coronary artery disease)    myoview 5/08:  EF 76%, no scar, no ischemia, +ECG changes with exercise;  cath 04/08/07:  pLAD 30%, mLAD 60-70% - med tx.  . Chronic cystitis    recurrent UTIs, started bactrim ppx (McDiarmid)  . Depression   . Familial tremor    followed by Dr. Erling Cruz  . GERD (gastroesophageal reflux disease)    s/p nissen  . History of pneumonia   . HLD (hyperlipidemia)   . Hydronephrosis, bilateral   . Hypothyroidism   . Internal hemorrhoids   . Multiple system atrophy [facility])    [facility], now [name]  . Neurogenic bladder    Tannenbaum/MacDiarmid  . Osteoporosis 2016   DEXA T-4.4 spine deteriorated since 2012  . Unspecified chronic bronchitis ([place])   .  VAGINITIS, ATROPHIC 11/07/2007    Patient Active Problem List   Diagnosis Date Noted  . Neck pain 10/06/2016  . General weakness 02/26/2016  . Chills 02/26/2016  . Other fatigue 02/04/2016  . Neurogenic bladder   . Chronic cystitis 04/18/2015  . Pedal edema 04/01/2015  . Constipation 12/14/2014  . Medicare annual wellness visit, subsequent 11/27/2014  . Advanced care planning/counseling discussion 11/27/2014  . Osteoporosis   . Vitamin B12 deficiency 06/07/2014  . Recurrent falls 02/06/2014  . Multiple system atrophy ([place])   . Dysphagia 07/28/2013  . Shoulder pain, left 07/20/2012  . 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 Health maintenance examination 08/26/2011  . Murmur 05/27/2011  . HYPERCHOLESTEROLEMIA  IIA 05/14/2010  . VENOUS INSUFFICIENCY, LEGS 06/21/2009  . CAD (coronary artery disease) 06/09/2008  . Hypothyroidism 05/15/2008  . ASTHMA 05/15/2008  . Depression 11/07/2007  . 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 GERD 11/07/2007  . VAGINITIS, ATROPHIC 11/07/2007    Past Surgical History:  Procedure Laterality Date  . COLONOSCOPY N/A 08/29/2013   int hemm; Inda Castle, MD  . CYSTECTOMY    . DEXA  12/2014   T -4.4 spine  . ESOPHAGOGASTRODUODENOSCOPY N/A 01/09/2015   Procedure: ESOPHAGOGASTRODUODENOSCOPY (EGD);  Surgeon: Inda Castle, MD;  Location: [facility];  Service: Endoscopy;  Laterality: N/A;  help with transfers  . LAPAROSCOPIC NISSEN FUNDOPLICATION    . TUBAL LIGATION    . TUMOR  REMOVAL      OB History    No data available       Home Medications    Prior to Admission medications   Medication Sig Start Date End Date Taking? Authorizing Provider  albuterol (PROAIR HFA) 108 (90 Base) MCG/ACT inhaler Inhale 2 puffs into the lungs every 6 (six) hours as needed for wheezing or shortness of breath. Reported on 01/22/2016 11/06/16   Ria Bush, MD  aspirin (ASPIRIN EC) 81 MG EC tablet Take 81 mg by mouth 2 (two) times a week. Swallow whole. Take  at bedtime    [provider]  baclofen (LIORESAL) 10 MG tablet Take 1 tablet (10 mg total) by mouth at bedtime as needed for muscle spasms. 10/19/16   Star Age, MD  Cholecalciferol (VITAMIN D3) 50000 units CAPS TAKE 1 CAPSULE BY MOUTH ONCE A WEEK 02/26/17   Ria Bush, MD  clonazePAM (KLONOPIN) 0.5 MG tablet TAKE 1-2 TABLETS BY MOUTH DAILY AS NEEDED Patient taking differently: TAKE 1-2 TABLETS BY MOUTH DAILY AS NEEDED FOR ANXIETY 12/14/16   Ria Bush, MD  Cranberry (ELLURA) 200 MG CAPS Take 1 capsule by mouth every evening.     [provider]  cyanocobalamin (,VITAMIN B-12,) 1000 MCG/ML injection Inject 1,000 mcg into the muscle every 30 (thirty) days. Take the first of the month    [provider]  dexlansoprazole (DEXILANT) 60 MG capsule Take 1 capsule (60 mg total) by mouth daily. 11/16/16   Pyrtle, Lajuan Lines, MD  famotidine (PEPCID) 40 MG tablet TAKE 1 TABLET (40 MG TOTAL) BY MOUTH 2 (TWO) TIMES DAILY AS NEEDED FOR HEARTBURN. 01/10/17   Ria Bush, MD  furosemide (LASIX) 20 MG tablet Take 1 tablet (20 mg total) by mouth daily as needed for fluid or edema. 04/04/15   Ria Bush, MD  hyoscyamine (LEVSIN SL) 0.125 MG SL tablet PLACE 2 TABLETS (0.25 MG TOTAL) UNDER THE TONGUE EVERY 4 (FOUR) HOURS AS NEEDED (SPASMS). 12/23/15   Pyrtle, Lajuan Lines, MD  isosorbide mononitrate (IMDUR) 30 MG 24 hr tablet Take 0.5 tablets (15 mg total) by mouth daily. 11/12/16   [name], MD  isosorbide mononitrate (IMDUR) 30 MG 24 hr tablet TAKE 1/2 TABLET BY MOUTH DAILY Patient not taking: Reported on 03/07/2017 01/12/17   [name], MD  MYRBETRIQ 50 MG TB24 tablet Take 50 mg by mouth daily. 03/25/16   [provider]  nitroGLYCERIN (NITROSTAT) 0.4 MG SL tablet Place 1 tablet (0.4 mg total) under the tongue every 5 (five) minutes as needed (chest pain). 08/07/15   [name], MD  polyethylene glycol powder (GLYCOLAX/MIRALAX) powder Take 17 g by mouth daily  as needed for moderate constipation. 04/18/15   Ria Bush, MD  propranolol (INDERAL) 10 MG tablet TAKE 1 TABLET BY MOUTH 3 TIMES DAILY AS NEEDED (TREMORS). 04/12/17   Ria Bush, MD  rosuvastatin (CRESTOR) 20 MG tablet Take 0.5 tablets (10 mg total) by mouth daily. 11/06/16   [name], MD  sertraline (ZOLOFT) 25 MG tablet TAKE 1 TABLET (25 MG TOTAL) BY MOUTH DAILY. 11/17/16   Ria Bush, MD  SYNTHROID 112 MCG tablet TAKE 1 TABLET (112 MCG TOTAL) BY MOUTH DAILY BEFORE BREAKFAST. 02/01/17   Ria Bush, MD  trimethoprim (TRIMPEX) 100 MG tablet Take 100 mg by mouth daily. 02/10/17   [provider]    Family History Family History  Problem Relation Age of Onset  . Hypertension Mother   . Coronary artery disease Father   . Heart  attack Father   . Heart disease Father   . Diabetes Daughter   . Breast cancer Maternal Aunt   . Coronary artery disease Daughter   . Colon cancer Maternal Aunt   . Multiple sclerosis Daughter   . Stroke Daughter   . Heart attack Daughter     Social History Social History  Substance Use Topics  . Smoking status: Never Smoker  . Smokeless tobacco: Never Used  . Alcohol use No     Allergies   Keflex [cephalexin]   Review of Systems Review of Systems 10 Systems reviewed and are negative for acute change except as noted in the HPI.   Physical Exam Updated Vital Signs BP (!) 166/89   Pulse 71   Temp 97.5 F (36.4 C) (Oral)   Resp 19   Ht 5' 2.5" (1.588 m)   Wt 65.8 kg (145 lb)   SpO2 99%   BMI 26.10 kg/m   Physical Exam  Constitutional:  Patient is alert and pleasant interactive. She has not any distress. No respiratory distress. She must sit in a fairly upright position due to neck and back problems.  HENT:  Head: Normocephalic and atraumatic.  Nose: Nose normal.  Mouth/Throat: Oropharynx is clear and moist.  Eyes: EOM are normal. Pupils are equal, round, and reactive to light.  Neck:  Patient has  severe problems with degenerative neck disease and back. No anterior soft tissue swelling or tenderness.  Cardiovascular: Normal rate, regular rhythm, normal heart sounds and intact distal pulses.   Pulmonary/Chest: Effort normal and breath sounds normal.  Abdominal: Soft. She exhibits no distension. There is no tenderness. There is no guarding.  Musculoskeletal:  Patient must sit fairly upright and has significant kyphosis. This does not seem to cause her significant distress at this time. No significant peripheral edema. Skin condition lower extremities is good.  Neurological:  Patient is alert and interactive. Speech is slightly slowed but content is normal. She follows commands for bilateral hand grip and movement of lower extremities.  Skin: Skin is warm and dry.  Psychiatric: She has a normal mood and affect.     ED Treatments / Results  Labs (all labs ordered are listed, but only abnormal results are displayed) Labs Reviewed  BASIC METABOLIC PANEL - Abnormal; Notable for the following:       Result Value   Glucose, Bld 139 (*)    GFR calc non Af Amer 59 (*)    All other components within normal limits  CBC WITH DIFFERENTIAL/PLATELET  PROTIME-INR  URINALYSIS, ROUTINE W REFLEX MICROSCOPIC  I-STAT TROPOININ, ED    EKG  EKG Interpretation None       Radiology No results found.  Procedures Procedures (including critical care time)  Medications Ordered in ED Medications - No data to display   Initial Impression / Assessment and Plan / ED Course  I have reviewed the triage vital signs and the nursing notes.  Pertinent labs & imaging results that were available during my care of the patient were reviewed by me and considered in my medical decision making (see chart for details).    Patient reports she will need some Ativan or sedation to do the MRI. She reports she does have difficulty lying flat due to back and neck pain with her underlying medical conditions.     Dr. Viviana Simpler to follow up on MRI results. If negative for CVA and patient remains at baseline. May continue evaluation as outpatient. Final Clinical Impressions(s) / ED  Diagnoses   Final diagnoses:  Slurred speech  Multiple system atrophy [facility])    New Prescriptions New Prescriptions   No medications on file     Charlesetta Shanks, MD 04/22/17 1721

## 2017-04-14 NOTE — ED Notes (Signed)
Pt taken to MRI  

## 2017-04-14 NOTE — Telephone Encounter (Signed)
PLEASE NOTE: All timestamps contained within this report are represented as [place] Standard Time. CONFIDENTIALTY NOTICE: This fax transmission is intended only for the addressee. It contains information that is legally privileged, confidential or otherwise protected from use or disclosure. If you are not the intended recipient, you are strictly prohibited from reviewing, disclosing, copying using or disseminating any of this information or taking any action in reliance on or regarding this information. If you have received this fax in error, please notify us immediately by telephone so that we can arrange for its return to [place]. Phone: [phone], Toll-Free: [phone], Fax: [phone] Page: 1 of 1 Call Id: [id number] [place] Day - Client Nonclinical Telephone Record [place] Day - Client Client Site [place] Physician Ria Bush - MD Contact Type Call Who Is Calling Patient / Member / Family / Caregiver Caller Name [name] [name] Caller Phone Number [phone] or [phone] Patient Name [name] [name] Call Type Message Only Information Provided Reason for Call Request for General Office Information Initial Comment Caller states wife is wheelchair bound, episode during the night with speech, and effected right side. MSA she has and asking for a urinalysis, concerned about a UTI. He is wondering about coming to pick it up. Additional Comment Provided information for the office. Connected caller for further assistance. Call Closed By: Rosana Fret Transaction Date/Time: 04/14/2017 10:42:20 AM (ET)

## 2017-04-14 NOTE — Telephone Encounter (Signed)
Sent to [facility]

## 2017-04-15 NOTE — ED Provider Notes (Signed)
Please see previous physicians note regarding patient's presenting history and physical, initial ED course, and associated medical decision making.  MRI shows no acute ischemia or acute intracranial processes. She has no neurological complaints in the ED. UA suggests possible UTI, nitrite positive. No urinary symptoms, but will treat with dose of fosfamycin if it could have contributed to symptoms this AM. Felt unlikely to be TIA and more a combination of being early in AM, with history of multiple system atrophy, and just started taking baclofen. Discussed with Dr. Leonel Ramsay who agreed that less likely TIA, and felt outpatient neurology follow-up would be appropriate. Discussed with patient. She feels comfortable with close outpatient follow-up with neurology. Strict return and follow-up instructions reviewed. She expressed understanding of all discharge instructions and felt comfortable with the plan of care.    Forde Dandy, MD 04/15/17 [phone]

## 2017-04-15 NOTE — Telephone Encounter (Signed)
ER records reviewed. She was treated for possible cystitis. Would call and f/u with patient, ensure doing better, and offer f/u office visit but if feeling better we can just monitor.

## 2017-04-15 NOTE — Telephone Encounter (Signed)
Pt was listed for TCM, but was never admitted in ED. Per Fonnie Mu sent to Dr Darnell Level for review

## 2017-04-16 NOTE — Telephone Encounter (Signed)
Left detailed message on voicemail.  

## 2017-04-17 LAB — URINE CULTURE: Culture: >=100000 — AB

## 2017-04-18 ENCOUNTER — Telehealth: Payer: Self-pay

## 2017-04-18 NOTE — Telephone Encounter (Signed)
NO further treatment needed for [facility] ED 04/14/17 per Janetta Hora [facility]

## 2017-04-19 ENCOUNTER — Ambulatory Visit (INDEPENDENT_AMBULATORY_CARE_PROVIDER_SITE_OTHER): Payer: Medicare Other | Admitting: Neurology

## 2017-04-19 ENCOUNTER — Other Ambulatory Visit: Payer: Self-pay | Admitting: Family Medicine

## 2017-04-19 ENCOUNTER — Encounter: Payer: Self-pay | Admitting: Neurology

## 2017-04-19 VITALS — BP 128/76 | HR 69 | Ht 62.0 in

## 2017-04-19 DIAGNOSIS — Z9181 History of falling: Secondary | ICD-10-CM

## 2017-04-19 DIAGNOSIS — R5381 Other malaise: Secondary | ICD-10-CM

## 2017-04-19 DIAGNOSIS — G20 Parkinson's disease: Secondary | ICD-10-CM | POA: Diagnosis not present

## 2017-04-19 NOTE — Progress Notes (Signed)
Subjective:    Patient ID: [name] [name] is a 76 y.o. female.  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 She is accompanied by [name] [name] husband today. I last saw [name] [name] on 10/19/2016, at which time she reported stress d/t [name] [name] husband's condition. He needed surgery, and had to be in the hospital for about 2 weeks. They had some help at the house, from 9-4 and from 4 to 10 PM. Son lives with them, their daughter passed away three years ago. She had tried baclofen on a few occasions but was not convinced it was helping the episodes of spasms and feelings of chills.    Today, 04/19/2017: She reports not sleeping very well. She restarted taking baclofen at night to help [name] [name] relax. She had restarted [name] [name] Baclofen about 5 days prior to [name] [name] fall in April. Son lives with them. They have 2 people come in to help, from 9 AM to 4 PM and 4 PM to 10 PM. She is more frail and deconditioned, it is more harder for [name] [name] to feed herself.   Of note, she presented to the emergency room on 03/07/17 after a fall. I reviewed the emergency room records. She had a head CT and cervical spine CT without contrast on 03/07/2017 which I reviewed: IMPRESSION: No evidence of acute traumatic intracranial injury. No evidence of acute traumatic injury to the cervical spine. Probable benign osseous lesion within the clivus and left pterygoid plate. 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  She also presented to the emergency room on 04/14/2017 with slurring of speech. She had  an MRI brain without contrast on 04/14/2017 which I reviewed:  IMPRESSION: No acute intracranial abnormality. 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   She was diagnosed with a UTI at the time. She was treated with an antibiotic.   The patient's allergies, current medications, family history, past medical history, past social history, past surgical history and problem list were reviewed and updated as appropriate.   Previously (copied from previous notes for reference):   I saw [name] [name] on 04/28/2016, at which time she reported having had chills, felt cold all the time, thyroid function was checked by PCP about 3 months prior. She was not sleeping very well. [name] [name] husband was having a difficult time caring for [name] [name]. He had lost weight and reported more stress. She was checked by cardiology and was stable from a cardiac standpoint. She had received a B12 shot recently through PCP. She went to the emergency room on 03/17/2016 for uncontrollable chills. For muscle spasms I suggested she try low-dose baclofen.    I saw [name] [name] on 09/18/2015, at which time she reported muscle spasms in various places. They did not feel like cramps. She was in the interim seen by [name], nurse practitioner, on 01/22/2016.    I saw [name] [name] on 07/29/2015, at which time she reported doing fairly well. No choking, no recent falls and he [name] [name] husband was helping [name] [name] with mobilizing with a walker. She had to go to the emergency room on 04/11/2015 for UTI. She was in outpatient physical therapy. She  was seeing [name] [name] urologist regularly and they had talked about Botox injections and potentially a surgically placed catheter. She had no headaches. She had a follow-up phone call from the [facility] about a potential research study but she did not get encouragement to pursue it from the doctor. In the interim, she calls in October 2016 reporting a 2-4 day history of increased muscle spasms. She felt that she had spasms all over.  She was encouraged to see [name] [name] primary care provider. She was seen and had blood work which showed no abnormality of [name] [name] electrolytes, kidney function. [name] [name] magnesium level was normal.     I saw [name] [name] on 03/27/2015, at which time she reported new left hand numbness for the past 1 month. She woke up with left hand symptoms. She had some numbness and tingling. [name] [name] husband noted that she was leaning to the left. [name] [name] last brain MRI was on 07/30/2015 which showed no acute abnormality. I reviewed the MRI last time. She denied any slurring of speech, droopy face, lower extremity weakness. She had some lower extremity swelling which was worse. [name] [name] left hand numbness improved after a few days. [name] [name] leaning improved. She had finished physical therapy in April 2016 and felt it was helpful and wondered if she could restart it. I referred [name] [name] back to physical therapy outpatient. I suggested we proceed with a brain MRI. She had a brain MRI without contrast on 04/23/2015 and we called [name] [name] subsequently with [name] [name] test results: Abnormal MRI brain (without) demonstrating:   1. Small left fronto-parietal, parasagittal focus of encephalomalacia and gliosis, possible a chronic ischemic infarction.   2. Few scattered periventricular foci of non-specific gliosis. 3. No acute findings. 4. No significant change from MRI on 07/29/12.   In addition, I personally reviewed the images through the PACS system.   I saw [name] [name] on 12/11/2014, at which time she reported feeling about the same as far as [name] [name] motor symptoms. She had more freezing, especially in [name] [name] right leg. She was in physical therapy and felt it was helpful. She was trying to do stretching at home. [name] [name] husband reported that he does not feel comfortable leaving [name] [name] at home alone. They were in touch with [facility] over the phone but there were no updates on research for multiple system atrophy. She has a call alert button. She had no recent falls. She was having trouble going to  sleep. She was sleeping in [name] [name] lift chair. She had new reading glasses. She had diarrhea in the past when she tried Linzess through [name] [name] GI doctor. She requested a sooner than scheduled appointment due to abnormal posture and left-sided numbness.   I saw [name] [name] on 07/05/2014, at which time she reported having had more falls. She had not been seen at the [facility] clinic. Stem cell trial was discussed and another trial in [place] with alpha-synuclein depleting therapy at the last visit there. She was eating well and [name] [name] spirits were good. We restarted outpatient physical therapy and she has been going to the neuro rehabilitation unit.    I saw [name] [name] on 02/02/2014, at which time she reported that physical therapy was helpful. She had fallen a few times and bruised [name] [name] ribs. She had a colonoscopy on 08/29/2013. She reported a history of hemorrhoids and chronic constipation for which she had tried Dulcolax. I referred [name] [name] back to physical therapy. 4 chronic constipation I asked [name] [name] to try Linzess. I did not suggest any other new medications. She goes to the  [name] once a year and had an appointment in on 04/27/14 and I reviewed the note and shared the data with them. She had previously tried and failed Sinemet.    I saw [name] [name] on 09/05/2013, at which time she presented for a sooner than scheduled appointment due to recurrent falls. She had fallen 6 times within about 1-1/2 weeks' time. 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 I advised [name] [name] to use [name] [name] walker at all times. We talked about potentially going to a motorized wheelchair down the [place]. She was advised to drink more water. She had tried and failed Sinemet in the past. I suggested a second opinion at [facility] which they declined. I referred [name] [name] to physical and occupational therapy outpatient. In the interim, I prescribed a light weight wheelchair for [name] [name].  [name] [name] daughter recently died. She had advanced MS.   I first met [name] [name] on 05/10/2013, at which time we discussed the possibility of MSA. I did not make any medication changes. We talked about the nature of progressive parkinsonism and gait disorder. She had [name] [name] last outpatient PT in July 2014. She tried to do stretching at home. She had a colonoscopy.   She previously followed with Dr. Morene Antu and was last seen by him on 12/30/2012, at which time he felt that she had worsening of [name] [name] gait. She also had symptoms of vertigo. She has been on baby aspirin, Synthroid, Crestor, and/or, clonazepam, propranolol, sertraline, trimethoprim, Nasonex, Provera, nitroglycerin as needed, vitamin B12.   She was diagnosed about 20 years ago with essential tremor. She had head tremor more than upper extremity tremor. She has a history of lung disease but was able to take low-dose propranolol. She has not tried primidone. She has had gait and balance problems in the last 5+ years approximately. She has to hold onto something. She has had bladder incontinence since [name] [name] 30s. She was on Crestor for 3 years which was discontinued in June 2011. CK was normal and EMG nerve conduction studies were normal in June 2011. Crestor was restarted. She has been on B12 injections without subjective improvement. MRI brain with and without contrast showed hyperostosis frontalis, MRI C-spine showed mild degenerative joint disease. 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 Lumbar spine MRI from July 2012 showed mild degenerative disc disease. 4 gait dysfunction she was referred to Dr. Gilford Rile with a question of myelopathy versus orthostatic tremor versus lower half parkinsonism versus essential tremor with gait disorder. 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 She was seen at the [facility] in August 2013 and was felt to have  a form of parkinsonism, possibly MSA with abnormal sweat testing. Further testing revealed negative tilt table test but there was mild autonomic neuropathy per autonomic reflex screen. She has had swelling in both legs with negative Doppler of the legs and normal echocardiogram. Additional workup at [facility] included vitamin D, paraneoplastic antibodies, anti-GAD, all negative.   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 She has been using a 2 wheeled walker for the past 2 years.   She has been in PT and OT with improvement.   She endorsed a lot of stress, due primarily to [name] [name]  mother's and daughter's health, who died in 25-Feb-2014. Mother is in [name] [name] 7s and in a NH. She sleeps in a recliner, d/t pain in [name] [name] trunk, back, neck and stiffness and difficulty getting in and out of bed.  [name] [name] Past Medical History Is Significant For: Past Medical History:  Diagnosis Date  . Asthma   . CAD (coronary artery disease)    myoview 5/08:  EF 76%, no scar, no ischemia, +ECG changes with exercise;  cath 04/08/07:  pLAD 30%, mLAD 60-70% - med tx.  . Chronic cystitis    recurrent UTIs, started bactrim ppx (McDiarmid)  . Depression   . Familial tremor    followed by Dr. Erling Cruz  . GERD (gastroesophageal reflux disease)    s/p nissen  . History of pneumonia   . HLD (hyperlipidemia)   . Hydronephrosis, bilateral   . Hypothyroidism   . Internal hemorrhoids   . Multiple system atrophy [facility])    [facility], now [facility]  . Neurogenic bladder    Tannenbaum/MacDiarmid  . Osteoporosis 2016   DEXA T-4.4 spine deteriorated since 2012  . Unspecified chronic bronchitis ([place])   . VAGINITIS, ATROPHIC 11/07/2007    [name] [name] Past Surgical History Is Significant For: Past Surgical History:  Procedure Laterality Date  . COLONOSCOPY N/A 08/29/2013   int hemm; Inda Castle, MD  . CYSTECTOMY    . DEXA  12/2014   T -4.4 spine  . ESOPHAGOGASTRODUODENOSCOPY N/A 01/09/2015    Procedure: ESOPHAGOGASTRODUODENOSCOPY (EGD);  Surgeon: Inda Castle, MD;  Location: [facility];  Service: Endoscopy;  Laterality: N/A;  help with transfers  . LAPAROSCOPIC NISSEN FUNDOPLICATION    . TUBAL LIGATION    . TUMOR REMOVAL      [name] [name] Family History Is Significant For: Family History  Problem Relation Age of Onset  . Hypertension Mother   . Coronary artery disease Father   . Heart attack Father   . Heart disease Father   . Diabetes Daughter   . Breast cancer Maternal Aunt   . Coronary artery disease Daughter   . Colon cancer Maternal Aunt   . Multiple sclerosis Daughter   . Stroke Daughter   . Heart attack Daughter     [name] [name] Social History Is Significant For: Social History   Social History  . Marital status: Married    Spouse name: [name]  . Number of children: 2  . Years of education: HS   Occupational History  .  Other   Social History Main Topics  . Smoking status: Never Smoker  . Smokeless tobacco: Never Used  . Alcohol use No  . Drug use: No  . Sexual activity: Not Currently   Other Topics Concern  . None   Social History Narrative   HSG, retired from [employer] . married '60. 1 son - '65; 1 daughter - '61; 2 grandchildren; 1 great-grandchild. Homemaker. marriage in good health. primary care-giver for [name] [name] mother. Positive history of passive tobacco smoke exposure.   Caffeine Use: 1 cup daily; 2 glasses of tea daily    [name] [name] Allergies Are:  Allergies  Allergen Reactions  . Keflex [Cephalexin] Diarrhea  :   [name] [name] Current Medications Are:  Outpatient Encounter Prescriptions as of 04/19/2017  Medication Sig  . Cholecalciferol (VITAMIN D3) 50000 units CAPS TAKE 1 CAPSULE BY MOUTH ONCE A WEEK (Patient taking differently: TAKE 1 CAPSULE BY MOUTH ONCE A WEEK on Mondays)  . clonazePAM (KLONOPIN) 0.5 MG tablet TAKE 1-2 TABLETS BY MOUTH DAILY AS NEEDED  .  dexlansoprazole (DEXILANT) 60 MG capsule Take 1 capsule (60 mg total) by mouth daily.  . famotidine (PEPCID)  40 MG tablet TAKE 1 TABLET (40 MG TOTAL) BY MOUTH 2 (TWO) TIMES DAILY AS NEEDED FOR HEARTBURN. (Patient taking differently: TAKE 1 TABLET (40 MG TOTAL) BY MOUTH DAILY AS NEEDED FOR HEARTBURN.)  . furosemide (LASIX) 20 MG tablet Take 1 tablet (20 mg total) by mouth daily as needed for fluid or edema.  . hyoscyamine (LEVSIN SL) 0.125 MG SL tablet PLACE 2 TABLETS (0.25 MG TOTAL) UNDER THE TONGUE EVERY 4 (FOUR) HOURS AS NEEDED (SPASMS).  . isosorbide mononitrate (IMDUR) 30 MG 24 hr tablet TAKE 1/2 TABLET BY MOUTH DAILY  . MYRBETRIQ 50 MG TB24 tablet Take 50 mg by mouth daily.  . nitroGLYCERIN (NITROSTAT) 0.4 MG SL tablet Place 1 tablet (0.4 mg total) under the tongue every 5 (five) minutes as needed (chest pain).  . polyethylene glycol powder (GLYCOLAX/MIRALAX) powder Take 17 g by mouth daily as needed for moderate constipation.  Marland Kitchen PROAIR HFA 108 (90 Base) MCG/ACT inhaler INHALE 2 PUFFS INTO THE LUNGS EVERY 6 (SIX) HOURS AS NEEDED FOR WHEEZING OR SHORTNESS OF BREATH  . propranolol (INDERAL) 10 MG tablet TAKE 1 TABLET BY MOUTH 3 TIMES DAILY AS NEEDED (TREMORS). (Patient taking differently: TAKE 1 TABLET (16m) BY MOUTH DAILY)  . rosuvastatin (CRESTOR) 20 MG tablet Take 0.5 tablets (10 mg total) by mouth daily.  . sertraline (ZOLOFT) 25 MG tablet TAKE 1 TABLET (25 MG TOTAL) BY MOUTH DAILY.  . SYNTHROID 112 MCG tablet TAKE 1 TABLET (112 MCG TOTAL) BY MOUTH DAILY BEFORE BREAKFAST.  .Marland Kitchentrimethoprim (TRIMPEX) 100 MG tablet Take 100 mg by mouth daily.  . baclofen (LIORESAL) 10 MG tablet Take 1 tablet (10 mg total) by mouth at bedtime as needed for muscle spasms. (Patient not taking: Reported on 04/19/2017)  . [DISCONTINUED] isosorbide mononitrate (IMDUR) 30 MG 24 hr tablet Take 0.5 tablets (15 mg total) by mouth daily.   No facility-administered encounter medications on file as of 04/19/2017.   :  Review of Systems:  Out of a complete 14 point review of systems, all are reviewed and negative with the exception  of these symptoms as listed below:  Review of Systems  Neurological:       Pt presents today to discuss weakness. Pt husband believes after day 4 of taking Baclofen pt had stroke like symptoms.pt was seen in ED after stroke scare. Was + UTI at that time. MRI -. Pt asking if she could have another B 12 shot today. PCP usually gives.    Objective:  Neurologic Exam  Physical Exam Physical Examination:   Vitals:   04/19/17 1345  BP: 128/76  Pulse: 69   General Examination: The patient is a very pleasant 76y.o. female in no acute distress. She appears Frail and deconditioned. She is well groomed. She is situated in [name] [name] wheelchair.  HEENT: Normocephalic, atraumatic, pupils are equal, round and reactive to light and accommodation. Extraocular tracking shows limitation to upper gaze. She has significant saccadic breakdown of smooth pursuit, no nystagmus. Hearing is mildly impaired. She has hearing aids in place. She has moderate facial masking and normal facial sensation. Speech shows mild to moderate hypophonia with mild to moderate dysarthria and mild to moderate voice tremor. Neck shows moderate nuchal rigidity. No spasms noted. There is a mild head, no-no type tremor. Head is tilted to the L and she tends to lean a little bit to the left in  [name] [name] wheelchair. Oropharynx exam reveals: mild to moderate mouth dryness, adequate dental hygiene and mild airway crowding, due to narrow airway. Mallampati is class II.   Chest: Clear to auscultation without wheezing, rhonchi or crackles noted.  Heart: S1+S2+0, regular and normal without murmurs, rubs or gallops noted.   Abdomen: Soft, non-tender and non-distended with normal bowel sounds appreciated on auscultation.  Extremities: There is 1+ pitting edema in the distal lower extremities bilaterally, some bruising noted, some non-pitting puffiness.      Skin: Warm and dry without trophic changes noted. She has some smaller bruises on [name] [name] hands.    Musculoskeletal: exam reveals no obvious joint deformities, tenderness or joint swelling or erythema.   Neurologically:  Mental status: The patient is awake, alert and oriented in all 4 spheres. [name] [name] memory, attention, language and knowledge are fairly well preserved. There is no aphasia, agnosia, apraxia or anomia. Thought process is linear. Mood is congruent and affect is normal.  Cranial nerves are as described above under HEENT exam. Motor exam: she has thin bulk, 4/5 global strength and tone is increased throughout, moderately, R>L. There is no drift, mild to moderate resting tremor in the b/l UE. Grip strength is a little bit worse on the right. She has a mild action tremor bilaterally as well as a mild postural tremor, right worse than left. Romberg is not testable, as she cannot stand. Reflexes are 2+ in the UEs and LEs. Fine motor skills are moderately to severely impaired in all 4 extremities.  Cerebellar testing shows no dysmetria or intention tremor. Cannot do HTS. Sensory exam is intact to light touch in both upper and lower extremities.  Gait, station and balance: I did not try to stand or walk [name] [name] today, she requires maximum assistance.   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 She has progressed with time, as is expected for this rare, neurodegenerative disease. She had reported generalized muscle spasms and feeling of chills. She had tried low-dose baclofen but did not improve, had actually stopped taking it but restarted it in April. She had a fall in April. She is advised to stay off the baclofen. She is furthermore advised to avoid any  additional sedating medications. For sleep, I suggested she try over-the-counter melatonin. We will reinitiate home health physical therapy. She has become more deconditioned and frail. [name] [name] husband has had his own medical issues in the past year. Thankfully, they have help at the house with caretakers from 9 AM to about 10 PM and their son lives with them and can assist at night if needed.  She had a brain MRI in June 2016 with stable findings compared to September 2013, which was reassuring. 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 She has had more neck pain and neck stiffness. [name] [name] recent CT neck without contrast in April 2018 confirmed multilevel arthritic changes in [name] [name] neck. She has fallen repeatedly in the past and also recently and continues to be at high fall risk. She has been seen at [facility] for second opinion and also [facility]. She is advised to consider another opinion at one  of the larger neurological centers in this area, [name] [name] husband would like to think about it. She is at this point confined to [name] [name] wheelchair. She sleeps in [name] [name] lift chair. She has seen a GI specialist and a urologist for [name] [name] bladder spasms and recurrent urinary tract infections. I suggested a six-month checkup, sooner as needed. I answered all their questions today and the patient and [name] [name] husband were in agreement. I spent 25 minutes in total face-to-face time with the patient, more than 50% of which was spent in counseling and coordination of care, reviewing test results, reviewing medication and discussing or reviewing the diagnosis of MSA, its prognosis and treatment options. Pertinent laboratory and imaging test results that were available during this visit with the patient were reviewed by me and considered in my medical decision making (see chart for details).

## 2017-04-19 NOTE — Telephone Encounter (Signed)
CPE 08/03/2016, last rx 11/06/16 #1 w/ 0 refills

## 2017-04-19 NOTE — Patient Instructions (Addendum)
You have become weaker and more deconditioned with time.  You will need help with your daily activities and you are at fall risk.   You can try Melatonin at night for sleep: take 1 mg to 3 mg, one to 2 hours before your bedtime. You can go up to 5 mg if needed. It is over the counter and comes in pill form, chewable form and spray, if you prefer.    We will reinitiate home health PT through [facility].   As discussed, you may want to discuss another neurological opinion at [facility] or [facility] or here in [place].

## 2017-04-20 ENCOUNTER — Telehealth: Payer: Self-pay | Admitting: Cardiovascular Disease

## 2017-04-20 NOTE — Telephone Encounter (Signed)
New Message  Pt husband Call requesting to speak with RN. pt would like to schedule appt for a sooner available time with only Dr. Burt Knack. please call back to discuss

## 2017-04-20 NOTE — Telephone Encounter (Signed)
I spoke with the pt's husband and made him aware that I will place the pt's name on wait list for appointment in August.

## 2017-04-23 ENCOUNTER — Ambulatory Visit (INDEPENDENT_AMBULATORY_CARE_PROVIDER_SITE_OTHER): Payer: Medicare Other | Admitting: Family Medicine

## 2017-04-23 ENCOUNTER — Encounter: Payer: Self-pay | Admitting: Family Medicine

## 2017-04-23 VITALS — BP 130/68 | HR 70 | Temp 97.5°F

## 2017-04-23 DIAGNOSIS — G239 Degenerative disease of basal ganglia, unspecified: Secondary | ICD-10-CM | POA: Diagnosis not present

## 2017-04-23 DIAGNOSIS — M47812 Spondylosis without myelopathy or radiculopathy, cervical region: Secondary | ICD-10-CM | POA: Diagnosis not present

## 2017-04-23 DIAGNOSIS — E538 Deficiency of other specified B group vitamins: Secondary | ICD-10-CM

## 2017-04-23 DIAGNOSIS — G903 Multi-system degeneration of the autonomic nervous system: Secondary | ICD-10-CM

## 2017-04-23 DIAGNOSIS — R531 Weakness: Secondary | ICD-10-CM | POA: Diagnosis not present

## 2017-04-23 DIAGNOSIS — G20 Parkinson's disease: Secondary | ICD-10-CM

## 2017-04-23 DIAGNOSIS — R296 Repeated falls: Secondary | ICD-10-CM

## 2017-04-23 DIAGNOSIS — N3020 Other chronic cystitis without hematuria: Secondary | ICD-10-CM | POA: Diagnosis not present

## 2017-04-23 LAB — POC URINALSYSI DIPSTICK (AUTOMATED)
BILIRUBIN UA: NEGATIVE
Blood, UA: NEGATIVE
Glucose, UA: NEGATIVE
KETONES UA: NEGATIVE
LEUKOCYTES UA: NEGATIVE
Nitrite, UA: NEGATIVE
PH UA: 6 (ref 5.0–8.0)
Protein, UA: NEGATIVE
Spec Grav, UA: 1.015 (ref 1.010–1.025)
Urobilinogen, UA: 0.2 E.U./dL

## 2017-04-23 MED ORDER — FAMOTIDINE 40 MG PO TABS: 40.0000 mg | ORAL_TABLET | Freq: Every day | ORAL | Status: DC

## 2017-04-23 MED ORDER — MELATONIN 3 MG PO TABS: 3.0000 mg | ORAL_TABLET | Freq: Every day | ORAL | Status: DC

## 2017-04-23 MED ORDER — CYANOCOBALAMIN 1000 MCG/ML IJ SOLN
1000.0000 ug | Freq: Once | INTRAMUSCULAR | Status: AC
  Administered 2017-04-23: 1000 ug via INTRAMUSCULAR

## 2017-04-23 NOTE — Progress Notes (Signed)
BP 130/68   Pulse 70   Temp 97.5 F (36.4 C) (Oral)   SpO2 97%    CC: ER f/u visit Subjective:    Patient ID: [name] [name], female    DOB: April 13, 1941, 76 y.o.   MRN: [id number]  HPI: [name] [name] is a 76 y.o. female presenting on 04/23/2017 for Hospitalization Follow-up   Here with husband and [place] caregiver.   Recent ER visit for fall, increased weakness, and slurred speech with some aphasia, note reviewed. This lasted 45 minutes. Head and cervical spine CT with no acute changes. MRI was also without acute changes. She was diagnosed with UTI and treated with fosfamycin x1. UCx returned growing >100k Coag neg staph. ?baclofen effect (she had just restarted this medicine).   Recurrent UTIs - she does take trimethoprim 181m daily. Currently denies urinary symptoms (but has not had urinary symptoms with prior UTIs).  Saw neurology earlier this week - rec stay off baclofen. To try melatonin for sleep. [facility]/de[facility].   She does have 2 caregivers who are present from 9am to 10pm. Their son lives with them as well. Confined to wheelchair, sleeps in lift chair.   Noticing increasing neck pain, increasing arm weakness. Needs assistance of 2 people to get into bathroom. Has not been using heating pad.   Husband struggling with mortality of illness.   Relevant past medical, surgical, family and social history reviewed and updated as indicated. Interim medical history since our last visit reviewed. Allergies and medications reviewed and updated. Outpatient Medications Prior to Visit  Medication Sig Dispense Refill  . Cholecalciferol (VITAMIN D3) 50000 units CAPS TAKE 1 CAPSULE BY MOUTH ONCE A WEEK (Patient taking differently: TAKE 1 CAPSULE BY MOUTH ONCE A WEEK on Mondays) 12 capsule 1  . clonazePAM (KLONOPIN) 0.5 MG tablet TAKE 1-2 TABLETS BY MOUTH DAILY AS NEEDED 60 tablet 3  . furosemide (LASIX) 20 MG tablet Take 1 tablet (20 mg  total) by mouth daily as needed for fluid or edema. 30 tablet 0  . hyoscyamine (LEVSIN SL) 0.125 MG SL tablet PLACE 2 TABLETS (0.25 MG TOTAL) UNDER THE TONGUE EVERY 4 (FOUR) HOURS AS NEEDED (SPASMS). 30 tablet 5  . isosorbide mononitrate (IMDUR) 30 MG 24 hr tablet TAKE 1/2 TABLET BY MOUTH DAILY 30 tablet 2  . MYRBETRIQ 50 MG TB24 tablet Take 50 mg by mouth daily.  11  . nitroGLYCERIN (NITROSTAT) 0.4 MG SL tablet Place 1 tablet (0.4 mg total) under the tongue every 5 (five) minutes as needed (chest pain). 25 tablet 3  . polyethylene glycol powder (GLYCOLAX/MIRALAX) powder Take 17 g by mouth daily as needed for moderate constipation. 250 g 1  . PROAIR HFA 108 (90 Base) MCG/ACT inhaler INHALE 2 PUFFS INTO THE LUNGS EVERY 6 (SIX) HOURS AS NEEDED FOR WHEEZING OR SHORTNESS OF BREATH 8.5 Inhaler 0  . propranolol (INDERAL) 10 MG tablet TAKE 1 TABLET BY MOUTH 3 TIMES DAILY AS NEEDED (TREMORS). (Patient taking differently: TAKE 1 TABLET (162m BY MOUTH DAILY) 180 tablet 0  . rosuvastatin (CRESTOR) 20 MG tablet Take 0.5 tablets (10 mg total) by mouth daily. 45 tablet 3  . sertraline (ZOLOFT) 25 MG tablet TAKE 1 TABLET (25 MG TOTAL) BY MOUTH DAILY. 90 tablet 3  . SYNTHROID 112 MCG tablet TAKE 1 TABLET (112 MCG TOTAL) BY MOUTH DAILY BEFORE BREAKFAST. 30 tablet 6  . trimethoprim (TRIMPEX) 100 MG tablet Take 100 mg by mouth daily.  11  . dexlansoprazole (DEXILANT) 60 MG capsule Take 1 capsule (60 mg total) by mouth daily. 30 capsule 1  . famotidine (PEPCID) 40 MG tablet TAKE 1 TABLET (40 MG TOTAL) BY MOUTH 2 (TWO) TIMES DAILY AS NEEDED FOR HEARTBURN. (Patient taking differently: TAKE 1 TABLET (40 MG TOTAL) BY MOUTH DAILY AS NEEDED FOR HEARTBURN.) 180 tablet 1  . baclofen (LIORESAL) 10 MG tablet Take 1 tablet (10 mg total) by mouth at bedtime as needed for muscle spasms. (Patient not taking: Reported on 04/19/2017) 30 each 5   No facility-administered medications prior to visit.      Per HPI unless specifically  indicated in ROS section below Review of Systems     Objective:    BP 130/68   Pulse 70   Temp 97.5 F (36.4 C) (Oral)   SpO2 97%   Wt Readings from Last 3 Encounters:  04/14/17 145 lb (65.8 kg)  03/07/17 145 lb (65.8 kg)  12/16/16 145 lb (65.8 kg)    Physical Exam  Constitutional: She appears well-developed and well-nourished. No distress.  HENT:  Mouth/Throat: Oropharynx is clear and moist. No oropharyngeal exudate.  Eyes: Conjunctivae and EOM are normal. Pupils are equal, round, and reactive to light. No scleral icterus.  Neck:  Limited ROM at neck  No midline cervical spine tenderness or significant paracervical mm tenderness Poor posture  Cardiovascular: Normal rate, regular rhythm, normal heart sounds and intact distal pulses.   No murmur heard. Pulmonary/Chest: Effort normal and breath sounds normal. No respiratory distress. She has no wheezes. She has no rales.  Musculoskeletal: She exhibits edema (tr pedal).  Neurological:  In wheelchair Spastic weakness of BUE noted  Skin: Skin is warm and dry. No rash noted.  Psychiatric: She has a normal mood and affect.  Nursing note and vitals reviewed.  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 She did complete treatment with fosfomycin. Will update UA - clear. Will continue daily trimethoprim ppx.      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 Husband has had difficulty coming to terms with incurability of illness.  They do have good amount of out of pocket personal care services at home. Son lives at home as well - he can help at night time as needed.  I agree with Dr Rexene Alberts about HHPT evaluation. I discussed with patient and spouse palliative care consult option and they are open to this. They will let me know when HHPT finishes treatment.  Will recheck UA today -  clear.       Osteoarthritis of cervical spine    Recent cervical CT 02/2017 showing multilevel osteoarthritic changes. Discussed this with patient. rec continue tylenol PRN, heating pad, gentle stretching exercises provided today as well.      Relevant Medications   aspirin EC 81 MG tablet   Recurrent falls    With recent symptoms at ER evaluation concern for TIA. Discussed this. She does take aspirin 19m Q3 days (limited due to easy bleeding). Suggested increase to QOD.  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  She has been referred for HHPT.       Vitamin B12 deficiency    b12 shot today.       Relevant Medications   cyanocobalamin ((VITAMIN B-12)) injection 1,000 mcg (Completed)       Follow up plan: Return in about 3 months (around 07/24/2017) for follow up visit.  JRia Bush MD

## 2017-04-23 NOTE — Assessment & Plan Note (Signed)
Progressive decline noted, reviewed disease progression with patient and husband. Husband has had difficulty coming to terms with incurability of illness.  They do have good amount of out of pocket personal care services at home. Son lives at home as well - he can help at night time as needed.  I agree with Dr Rexene Alberts about HHPT evaluation. I discussed with patient and spouse palliative care consult option and they are open to this. They will let me know when HHPT finishes treatment.  Will recheck UA today - clear.

## 2017-04-23 NOTE — Assessment & Plan Note (Signed)
Recent cervical CT 02/2017 showing multilevel osteoarthritic changes. Discussed this with patient. rec continue tylenol PRN, heating pad, gentle stretching exercises provided today as well.

## 2017-04-23 NOTE — Assessment & Plan Note (Signed)
b12 shot today.

## 2017-04-23 NOTE — Patient Instructions (Addendum)
Urinalysis today b12 shot today. We will await home health PT. Once they finish, let me know for palliative care evaluation.  Use heating pad, gentle stretching for back. Ok to continue tylenol as needed.  Return as needed or in 3 months for follow up visit

## 2017-04-23 NOTE — Assessment & Plan Note (Signed)
Doubt recent positive UCx was true infection. She did complete treatment with fosfomycin. Will update UA - clear. Will continue daily trimethoprim ppx.

## 2017-04-23 NOTE — Assessment & Plan Note (Signed)
With recent symptoms at ER evaluation concern for TIA. Discussed this. She does take aspirin 92m Q3 days (limited due to easy bleeding). Suggested increase to QOD.  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  She has been referred for HHPT.

## 2017-04-25 ENCOUNTER — Other Ambulatory Visit: Payer: Self-pay | Admitting: Family Medicine

## 2017-04-26 DIAGNOSIS — N319 Neuromuscular dysfunction of bladder, unspecified: Secondary | ICD-10-CM | POA: Diagnosis not present

## 2017-04-26 DIAGNOSIS — M6259 Muscle wasting and atrophy, not elsewhere classified, multiple sites: Secondary | ICD-10-CM | POA: Diagnosis not present

## 2017-04-26 DIAGNOSIS — R296 Repeated falls: Secondary | ICD-10-CM | POA: Diagnosis not present

## 2017-04-26 DIAGNOSIS — R2689 Other abnormalities of gait and mobility: Secondary | ICD-10-CM | POA: Diagnosis not present

## 2017-04-26 DIAGNOSIS — G20 Parkinson's disease: Secondary | ICD-10-CM | POA: Diagnosis not present

## 2017-04-26 DIAGNOSIS — I2510 Atherosclerotic heart disease of native coronary artery without angina pectoris: Secondary | ICD-10-CM | POA: Diagnosis not present

## 2017-04-27 ENCOUNTER — Telehealth: Payer: Self-pay | Admitting: Neurology

## 2017-04-27 NOTE — Telephone Encounter (Signed)
[name] [name] [phone] request VO tor add OT to eval and treat for MSA

## 2017-04-27 NOTE — Telephone Encounter (Signed)
Per vo by Dr. Krista Blue, provide approval for OT to eval and treat.  Returned call to [facility] and they will schedule the appt.

## 2017-04-28 DIAGNOSIS — G20 Parkinson's disease: Secondary | ICD-10-CM | POA: Diagnosis not present

## 2017-04-28 DIAGNOSIS — N319 Neuromuscular dysfunction of bladder, unspecified: Secondary | ICD-10-CM | POA: Diagnosis not present

## 2017-04-28 DIAGNOSIS — R296 Repeated falls: Secondary | ICD-10-CM | POA: Diagnosis not present

## 2017-04-28 DIAGNOSIS — I2510 Atherosclerotic heart disease of native coronary artery without angina pectoris: Secondary | ICD-10-CM | POA: Diagnosis not present

## 2017-04-28 DIAGNOSIS — M6259 Muscle wasting and atrophy, not elsewhere classified, multiple sites: Secondary | ICD-10-CM | POA: Diagnosis not present

## 2017-04-28 DIAGNOSIS — R2689 Other abnormalities of gait and mobility: Secondary | ICD-10-CM | POA: Diagnosis not present

## 2017-04-29 DIAGNOSIS — N319 Neuromuscular dysfunction of bladder, unspecified: Secondary | ICD-10-CM | POA: Diagnosis not present

## 2017-04-29 DIAGNOSIS — R2689 Other abnormalities of gait and mobility: Secondary | ICD-10-CM | POA: Diagnosis not present

## 2017-04-29 DIAGNOSIS — I2510 Atherosclerotic heart disease of native coronary artery without angina pectoris: Secondary | ICD-10-CM | POA: Diagnosis not present

## 2017-04-29 DIAGNOSIS — G20 Parkinson's disease: Secondary | ICD-10-CM | POA: Diagnosis not present

## 2017-04-29 DIAGNOSIS — M6259 Muscle wasting and atrophy, not elsewhere classified, multiple sites: Secondary | ICD-10-CM | POA: Diagnosis not present

## 2017-04-29 DIAGNOSIS — R296 Repeated falls: Secondary | ICD-10-CM | POA: Diagnosis not present

## 2017-05-03 DIAGNOSIS — M6259 Muscle wasting and atrophy, not elsewhere classified, multiple sites: Secondary | ICD-10-CM | POA: Diagnosis not present

## 2017-05-03 DIAGNOSIS — G20 Parkinson's disease: Secondary | ICD-10-CM | POA: Diagnosis not present

## 2017-05-03 DIAGNOSIS — I2510 Atherosclerotic heart disease of native coronary artery without angina pectoris: Secondary | ICD-10-CM | POA: Diagnosis not present

## 2017-05-03 DIAGNOSIS — R2689 Other abnormalities of gait and mobility: Secondary | ICD-10-CM | POA: Diagnosis not present

## 2017-05-03 DIAGNOSIS — N319 Neuromuscular dysfunction of bladder, unspecified: Secondary | ICD-10-CM | POA: Diagnosis not present

## 2017-05-03 DIAGNOSIS — R296 Repeated falls: Secondary | ICD-10-CM | POA: Diagnosis not present

## 2017-05-05 DIAGNOSIS — I2510 Atherosclerotic heart disease of native coronary artery without angina pectoris: Secondary | ICD-10-CM | POA: Diagnosis not present

## 2017-05-05 DIAGNOSIS — R2689 Other abnormalities of gait and mobility: Secondary | ICD-10-CM | POA: Diagnosis not present

## 2017-05-05 DIAGNOSIS — G20 Parkinson's disease: Secondary | ICD-10-CM | POA: Diagnosis not present

## 2017-05-05 DIAGNOSIS — M6259 Muscle wasting and atrophy, not elsewhere classified, multiple sites: Secondary | ICD-10-CM | POA: Diagnosis not present

## 2017-05-05 DIAGNOSIS — N319 Neuromuscular dysfunction of bladder, unspecified: Secondary | ICD-10-CM | POA: Diagnosis not present

## 2017-05-05 DIAGNOSIS — R296 Repeated falls: Secondary | ICD-10-CM | POA: Diagnosis not present

## 2017-05-10 ENCOUNTER — Telehealth: Payer: Self-pay

## 2017-05-10 DIAGNOSIS — I2510 Atherosclerotic heart disease of native coronary artery without angina pectoris: Secondary | ICD-10-CM | POA: Diagnosis not present

## 2017-05-10 DIAGNOSIS — G20 Parkinson's disease: Secondary | ICD-10-CM | POA: Diagnosis not present

## 2017-05-10 DIAGNOSIS — R2689 Other abnormalities of gait and mobility: Secondary | ICD-10-CM | POA: Diagnosis not present

## 2017-05-10 DIAGNOSIS — M6259 Muscle wasting and atrophy, not elsewhere classified, multiple sites: Secondary | ICD-10-CM | POA: Diagnosis not present

## 2017-05-10 DIAGNOSIS — R296 Repeated falls: Secondary | ICD-10-CM | POA: Diagnosis not present

## 2017-05-10 DIAGNOSIS — N319 Neuromuscular dysfunction of bladder, unspecified: Secondary | ICD-10-CM | POA: Diagnosis not present

## 2017-05-10 NOTE — Telephone Encounter (Signed)
Stacy PT with [facility] left v/m; pt was supposed to be discharged from [facility] PT this week but Marzetta Board just took over this case and Marzetta Board thinks pt would benefit from more PT;Stacy does not think pt is safe with her transfers.Stacy request verbal order for HHPT 2 x a week for 2 weeks.Please advise.

## 2017-05-11 NOTE — Telephone Encounter (Signed)
Agree with this. thanks.

## 2017-05-11 NOTE — Telephone Encounter (Signed)
Verbal order given  

## 2017-05-13 DIAGNOSIS — I2510 Atherosclerotic heart disease of native coronary artery without angina pectoris: Secondary | ICD-10-CM | POA: Diagnosis not present

## 2017-05-13 DIAGNOSIS — R296 Repeated falls: Secondary | ICD-10-CM | POA: Diagnosis not present

## 2017-05-13 DIAGNOSIS — G20 Parkinson's disease: Secondary | ICD-10-CM | POA: Diagnosis not present

## 2017-05-13 DIAGNOSIS — M6259 Muscle wasting and atrophy, not elsewhere classified, multiple sites: Secondary | ICD-10-CM | POA: Diagnosis not present

## 2017-05-13 DIAGNOSIS — N319 Neuromuscular dysfunction of bladder, unspecified: Secondary | ICD-10-CM | POA: Diagnosis not present

## 2017-05-13 DIAGNOSIS — R2689 Other abnormalities of gait and mobility: Secondary | ICD-10-CM | POA: Diagnosis not present

## 2017-05-18 ENCOUNTER — Ambulatory Visit (INDEPENDENT_AMBULATORY_CARE_PROVIDER_SITE_OTHER): Payer: Medicare Other | Admitting: Family Medicine

## 2017-05-18 ENCOUNTER — Encounter: Payer: Self-pay | Admitting: Family Medicine

## 2017-05-18 VITALS — BP 124/72 | HR 68 | Temp 97.6°F | Ht 62.5 in

## 2017-05-18 DIAGNOSIS — M899 Disorder of bone, unspecified: Secondary | ICD-10-CM | POA: Diagnosis not present

## 2017-05-18 DIAGNOSIS — G239 Degenerative disease of basal ganglia, unspecified: Secondary | ICD-10-CM | POA: Diagnosis not present

## 2017-05-18 DIAGNOSIS — G903 Multi-system degeneration of the autonomic nervous system: Secondary | ICD-10-CM

## 2017-05-18 DIAGNOSIS — E559 Vitamin D deficiency, unspecified: Secondary | ICD-10-CM | POA: Diagnosis not present

## 2017-05-18 DIAGNOSIS — R531 Weakness: Secondary | ICD-10-CM | POA: Diagnosis not present

## 2017-05-18 LAB — CBC WITH DIFFERENTIAL/PLATELET
BASOS PCT: 0.5 % (ref 0.0–3.0)
Basophils Absolute: 0 10*3/uL (ref 0.0–0.1)
EOS ABS: 0.1 10*3/uL (ref 0.0–0.7)
Eosinophils Relative: 1.2 % (ref 0.0–5.0)
HCT: 40.2 % (ref 36.0–46.0)
HEMOGLOBIN: 13.6 g/dL (ref 12.0–15.0)
LYMPHS ABS: 1.9 10*3/uL (ref 0.7–4.0)
Lymphocytes Relative: 32 % (ref 12.0–46.0)
MCHC: 33.9 g/dL (ref 30.0–36.0)
MCV: 86.8 fl (ref 78.0–100.0)
MONO ABS: 0.5 10*3/uL (ref 0.1–1.0)
Monocytes Relative: 8.6 % (ref 3.0–12.0)
NEUTROS ABS: 3.4 10*3/uL (ref 1.4–7.7)
NEUTROS PCT: 57.7 % (ref 43.0–77.0)
PLATELETS: 283 10*3/uL (ref 150.0–400.0)
RBC: 4.63 Mil/uL (ref 3.87–5.11)
RDW: 13.8 % (ref 11.5–15.5)
WBC: 5.9 10*3/uL (ref 4.0–10.5)

## 2017-05-18 LAB — COMPREHENSIVE METABOLIC PANEL
ALK PHOS: 110 U/L (ref 39–117)
ALT: 14 U/L (ref 0–35)
AST: 16 U/L (ref 0–37)
Albumin: 4.1 g/dL (ref 3.5–5.2)
BILIRUBIN TOTAL: 0.4 mg/dL (ref 0.2–1.2)
BUN: 15 mg/dL (ref 6–23)
CO2: 28 mEq/L (ref 19–32)
Calcium: 10.1 mg/dL (ref 8.4–10.5)
Chloride: 103 mEq/L (ref 96–112)
Creatinine, Ser: 0.88 mg/dL (ref 0.40–1.20)
GFR: 66.22 mL/min (ref >60.00)
GLUCOSE: 110 mg/dL — AB (ref 70–99)
Potassium: 4.7 mEq/L (ref 3.5–5.1)
Sodium: 139 mEq/L (ref 135–145)
TOTAL PROTEIN: 6.7 g/dL (ref 6.0–8.3)

## 2017-05-18 LAB — SEDIMENTATION RATE: Sed Rate: 7 mm/hr (ref 0–30)

## 2017-05-18 MED ORDER — CYANOCOBALAMIN 1000 MCG/ML IJ SOLN
1000.0000 ug | Freq: Once | INTRAMUSCULAR | Status: AC
  Administered 2017-05-18: 1000 ug via INTRAMUSCULAR

## 2017-05-18 NOTE — Progress Notes (Signed)
BP 124/72   Pulse 68   Temp 97.6 F (36.4 C)   Ht 5' 2.5" (1.588 m)    CC: weakness Subjective:    Patient ID: [name] [name], female    DOB: 06-Oct-1941, 76 y.o.   MRN: [id number]  HPI: [name] [name] is a 76 y.o. female presenting on 05/18/2017 for Acute Visit (feels weak)   Progressive weakness since fall early last month. Staying very cold worse at night time. Ongoing malaise worse in the mornings.  Persistent cervical neck pain L>R associated with neck stiffness.  Appetite good.  HHPT involved - but predominantly for safe transitions not strengthening. Planning on wrapping up next week. Pt desires to restart outpatient therapy after this.   Has been taking melatonin for sleep which initially helped but then may have had opposite effect so she stopped this.   Denies dysuria, abd pain, nausea/vomiting, diarrhea, cough, congestion. Ongoing neck pain but no other body or bone pain.   Relevant past medical, surgical, family and social history reviewed and updated as indicated. Interim medical history since our last visit reviewed. Allergies and medications reviewed and updated. Outpatient Medications Prior to Visit  Medication Sig Dispense Refill  . aspirin EC 81 MG tablet Take 81 mg by mouth every other day.    . Cholecalciferol (VITAMIN D3) 50000 units CAPS TAKE 1 CAPSULE BY MOUTH ONCE A WEEK (Patient taking differently: TAKE 1 CAPSULE BY MOUTH ONCE A WEEK on Mondays) 12 capsule 1  . clonazePAM (KLONOPIN) 0.5 MG tablet TAKE 1-2 TABLETS BY MOUTH DAILY AS NEEDED 60 tablet 3  . dexlansoprazole (DEXILANT) 60 MG capsule Take 1 capsule (60 mg total) by mouth daily as needed (GERD).    . famotidine (PEPCID) 40 MG tablet Take 1 tablet (40 mg total) by mouth daily.    . furosemide (LASIX) 20 MG tablet Take 1 tablet (20 mg total) by mouth daily as needed for fluid or edema. 30 tablet 0  . hyoscyamine (LEVSIN SL) 0.125 MG SL tablet PLACE 2 TABLETS (0.25 MG TOTAL) UNDER THE TONGUE EVERY 4  (FOUR) HOURS AS NEEDED (SPASMS). 30 tablet 5  . isosorbide mononitrate (IMDUR) 30 MG 24 hr tablet TAKE 1/2 TABLET BY MOUTH DAILY 30 tablet 2  . Melatonin 3 MG TABS Take 1 tablet (3 mg total) by mouth at bedtime.    Marland Kitchen MYRBETRIQ 50 MG TB24 tablet Take 50 mg by mouth daily.  11  . nitroGLYCERIN (NITROSTAT) 0.4 MG SL tablet Place 1 tablet (0.4 mg total) under the tongue every 5 (five) minutes as needed (chest pain). 25 tablet 3  . polyethylene glycol powder (GLYCOLAX/MIRALAX) powder Take 17 g by mouth daily as needed for moderate constipation. 250 g 1  . PROAIR HFA 108 (90 Base) MCG/ACT inhaler INHALE 2 PUFFS INTO THE LUNGS EVERY 6 (SIX) HOURS AS NEEDED FOR WHEEZING OR SHORTNESS OF BREATH 8.5 Inhaler 0  . propranolol (INDERAL) 10 MG tablet TAKE 1 TABLET BY MOUTH 3 TIMES DAILY AS NEEDED (TREMORS). (Patient taking differently: TAKE 1 TABLET (62m) BY MOUTH DAILY) 180 tablet 0  . rosuvastatin (CRESTOR) 20 MG tablet Take 0.5 tablets (10 mg total) by mouth daily. 45 tablet 3  . sertraline (ZOLOFT) 25 MG tablet TAKE 1 TABLET (25 MG TOTAL) BY MOUTH DAILY. 90 tablet 3  . SYNTHROID 112 MCG tablet TAKE 1 TABLET (112 MCG TOTAL) BY MOUTH DAILY BEFORE BREAKFAST. 30 tablet 6  . trimethoprim (TRIMPEX) 100 MG tablet Take 100 mg by mouth daily.  11   No facility-administered medications prior to visit.      Per HPI unless specifically indicated in ROS section below Review of Systems     Objective:    BP 124/72   Pulse 68   Temp 97.6 F (36.4 C)   Ht 5' 2.5" (1.588 m)   Wt Readings from Last 3 Encounters:  04/14/17 145 lb (65.8 kg)  03/07/17 145 lb (65.8 kg)  12/16/16 145 lb (65.8 kg)    Physical Exam  Constitutional: No distress.  In wheelchair  HENT:  Mouth/Throat: Oropharynx is clear and moist. No oropharyngeal exudate.  Eyes: Conjunctivae are normal. Pupils are equal, round, and reactive to light.  Cardiovascular: Normal rate, regular rhythm, normal heart sounds and intact distal pulses.   No  murmur heard. Pulmonary/Chest: Effort normal and breath sounds normal. No respiratory distress. She has no wheezes. She has no rales.  Abdominal: Soft. Bowel sounds are normal. She exhibits no distension. There is no tenderness. There is no rebound.  Musculoskeletal: She exhibits no edema.  Neurological:  Nonambulatory Spasticity  Skin: Skin is warm and dry. No rash noted.  Nursing note and vitals reviewed.  Results for orders placed or performed in visit on 04/23/17  POCT Urinalysis Dipstick (Automated)  Result Value Ref Range   Color, UA yellow    Clarity, UA clear    Glucose, UA neg    Bilirubin, UA neg    Ketones, UA neg    Spec Grav, UA 1.015 1.010 - 1.025   Blood, UA neg    pH, UA 6.0 5.0 - 8.0   Protein, UA neg    Urobilinogen, UA 0.2 0.2 or 1.0 E.U./dL   Nitrite, UA neg    Leukocytes, UA Negative Negative   Lab Results  Component Value Date   TSH 0.92 07/28/2016       Assessment & Plan:   Problem List Items Addressed This Visit    Bone lesion    I spoke with radiology about possible lytic lesions seen on recent CT. Unclear etiology, thought more likely degenerative in nature - consider rpt CT 6 mo to document stability. Discussed this with patient. No known h/o MM, bone or bladder or breast cancer. Will trend ALP today and discussed likely recommend rpt CT C-spine without contrast in October. Pt agrees with plan.       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 No signs of active infection. Will check labs to verify (CBC, CMP, ESR).       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       Vitamin D deficiency    Update vit D level. Compliant with 50,000 IU weekly.       Relevant Medications    cyanocobalamin ((VITAMIN B-12)) injection 1,000 mcg (Completed)   Other Relevant Orders   VITAMIN D 25 Hydroxy (Vit-D Deficiency, Fractures)       Follow up plan: Return if symptoms worsen or fail to improve.  Ria Bush, MD

## 2017-05-18 NOTE — Assessment & Plan Note (Signed)
I spoke with radiology about possible lytic lesions seen on recent CT. Unclear etiology, thought more likely degenerative in nature - consider rpt CT 6 mo to document stability. Discussed this with patient. No known h/o MM, bone or bladder or breast cancer. Will trend ALP today and discussed likely recommend rpt CT C-spine without contrast in October. Pt agrees with plan.

## 2017-05-18 NOTE — Assessment & Plan Note (Signed)
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 No signs of active infection. Will check labs to verify (CBC, CMP, ESR).

## 2017-05-18 NOTE — Patient Instructions (Addendum)
Labs today.  Weakness may be progression of multiple system atrophy.  When home health finishes, we may need to consider palliative care evaluation at home.

## 2017-05-18 NOTE — Assessment & Plan Note (Deleted)
(  4/292/018) CT C spine no contrast: Small scattered lytic lesions throughout the cervical vertebral bodies.

## 2017-05-18 NOTE — Assessment & Plan Note (Addendum)
Update vit D level. Compliant with 50,000 IU weekly.

## 2017-05-18 NOTE — Assessment & Plan Note (Signed)
Again anticipate progression of neurological disease, discussed this with patient and husband.

## 2017-05-19 ENCOUNTER — Telehealth: Payer: Self-pay

## 2017-05-19 DIAGNOSIS — N319 Neuromuscular dysfunction of bladder, unspecified: Secondary | ICD-10-CM | POA: Diagnosis not present

## 2017-05-19 DIAGNOSIS — R2689 Other abnormalities of gait and mobility: Secondary | ICD-10-CM | POA: Diagnosis not present

## 2017-05-19 DIAGNOSIS — I2510 Atherosclerotic heart disease of native coronary artery without angina pectoris: Secondary | ICD-10-CM | POA: Diagnosis not present

## 2017-05-19 DIAGNOSIS — R296 Repeated falls: Secondary | ICD-10-CM | POA: Diagnosis not present

## 2017-05-19 DIAGNOSIS — G20 Parkinson's disease: Secondary | ICD-10-CM | POA: Diagnosis not present

## 2017-05-19 DIAGNOSIS — M6259 Muscle wasting and atrophy, not elsewhere classified, multiple sites: Secondary | ICD-10-CM | POA: Diagnosis not present

## 2017-05-19 LAB — VITAMIN D 25 HYDROXY (VIT D DEFICIENCY, FRACTURES): VITD: 111.87 ng/mL (ref 30.00–100.00)

## 2017-05-19 NOTE — Telephone Encounter (Signed)
plz call patient - vitamin D was too high. rec she stop weekly vitamin D. We will recheck levels next labwork. I don't think this is accounting for her weakness but will see if she feels any better with stopping vit D.

## 2017-05-19 NOTE — Telephone Encounter (Signed)
Spoke to pt and informed per Dr Danise Mina

## 2017-05-19 NOTE — Telephone Encounter (Addendum)
DG float in [facility] gave me note that she took critical lab on pt Vit D 111.87. Note left at Dr Gutierrez's station and sending this phone note.Following critical lab guidelines, report to physician within 20 mins; I notified Dr Silvio Pate of results and he acknowledged and said no emergent action needed and to send to PCP. Done.

## 2017-05-21 DIAGNOSIS — I2510 Atherosclerotic heart disease of native coronary artery without angina pectoris: Secondary | ICD-10-CM | POA: Diagnosis not present

## 2017-05-21 DIAGNOSIS — R2689 Other abnormalities of gait and mobility: Secondary | ICD-10-CM | POA: Diagnosis not present

## 2017-05-21 DIAGNOSIS — G20 Parkinson's disease: Secondary | ICD-10-CM | POA: Diagnosis not present

## 2017-05-21 DIAGNOSIS — N319 Neuromuscular dysfunction of bladder, unspecified: Secondary | ICD-10-CM | POA: Diagnosis not present

## 2017-05-21 DIAGNOSIS — R296 Repeated falls: Secondary | ICD-10-CM | POA: Diagnosis not present

## 2017-05-21 DIAGNOSIS — M6259 Muscle wasting and atrophy, not elsewhere classified, multiple sites: Secondary | ICD-10-CM | POA: Diagnosis not present

## 2017-05-24 ENCOUNTER — Telehealth: Payer: Self-pay | Admitting: Neurology

## 2017-05-24 DIAGNOSIS — R296 Repeated falls: Secondary | ICD-10-CM | POA: Diagnosis not present

## 2017-05-24 DIAGNOSIS — N319 Neuromuscular dysfunction of bladder, unspecified: Secondary | ICD-10-CM | POA: Diagnosis not present

## 2017-05-24 DIAGNOSIS — M6259 Muscle wasting and atrophy, not elsewhere classified, multiple sites: Secondary | ICD-10-CM | POA: Diagnosis not present

## 2017-05-24 DIAGNOSIS — R2689 Other abnormalities of gait and mobility: Secondary | ICD-10-CM | POA: Diagnosis not present

## 2017-05-24 DIAGNOSIS — I2510 Atherosclerotic heart disease of native coronary artery without angina pectoris: Secondary | ICD-10-CM | POA: Diagnosis not present

## 2017-05-24 DIAGNOSIS — G20 Parkinson's disease: Secondary | ICD-10-CM | POA: Diagnosis not present

## 2017-05-24 NOTE — Telephone Encounter (Signed)
Esther/[facility] [phone] request new orders for OT 1 x 1 then 2 x 2 to continue the plan of care

## 2017-05-25 NOTE — Telephone Encounter (Signed)
Received a VO from Dr. Rexene Alberts for new OT orders. I called Esther at [facility] and gave her the OT orders for 1 time per week for 1 week and then 2 times per week for 2 weeks to continue the plan of care. Esther verbalized understanding.

## 2017-05-27 ENCOUNTER — Telehealth: Payer: Self-pay

## 2017-05-27 DIAGNOSIS — R2689 Other abnormalities of gait and mobility: Secondary | ICD-10-CM | POA: Diagnosis not present

## 2017-05-27 DIAGNOSIS — I2510 Atherosclerotic heart disease of native coronary artery without angina pectoris: Secondary | ICD-10-CM | POA: Diagnosis not present

## 2017-05-27 DIAGNOSIS — G20 Parkinson's disease: Secondary | ICD-10-CM | POA: Diagnosis not present

## 2017-05-27 DIAGNOSIS — N319 Neuromuscular dysfunction of bladder, unspecified: Secondary | ICD-10-CM | POA: Diagnosis not present

## 2017-05-27 DIAGNOSIS — R296 Repeated falls: Secondary | ICD-10-CM | POA: Diagnosis not present

## 2017-05-27 DIAGNOSIS — M6259 Muscle wasting and atrophy, not elsewhere classified, multiple sites: Secondary | ICD-10-CM | POA: Diagnosis not present

## 2017-05-27 NOTE — Telephone Encounter (Signed)
Stacy PT with [facility] advised.

## 2017-05-27 NOTE — Telephone Encounter (Signed)
Agree with this. Thank you.  

## 2017-05-27 NOTE — Telephone Encounter (Signed)
Stacy PT with [facility] request verbal order to continue [facility] PT 2 x a week for 2 weeks.

## 2017-05-31 DIAGNOSIS — N319 Neuromuscular dysfunction of bladder, unspecified: Secondary | ICD-10-CM | POA: Diagnosis not present

## 2017-05-31 DIAGNOSIS — R2689 Other abnormalities of gait and mobility: Secondary | ICD-10-CM | POA: Diagnosis not present

## 2017-05-31 DIAGNOSIS — R296 Repeated falls: Secondary | ICD-10-CM | POA: Diagnosis not present

## 2017-05-31 DIAGNOSIS — G20 Parkinson's disease: Secondary | ICD-10-CM | POA: Diagnosis not present

## 2017-05-31 DIAGNOSIS — I2510 Atherosclerotic heart disease of native coronary artery without angina pectoris: Secondary | ICD-10-CM | POA: Diagnosis not present

## 2017-05-31 DIAGNOSIS — M6259 Muscle wasting and atrophy, not elsewhere classified, multiple sites: Secondary | ICD-10-CM | POA: Diagnosis not present

## 2017-06-04 DIAGNOSIS — G20 Parkinson's disease: Secondary | ICD-10-CM | POA: Diagnosis not present

## 2017-06-04 DIAGNOSIS — R2689 Other abnormalities of gait and mobility: Secondary | ICD-10-CM | POA: Diagnosis not present

## 2017-06-04 DIAGNOSIS — R296 Repeated falls: Secondary | ICD-10-CM | POA: Diagnosis not present

## 2017-06-04 DIAGNOSIS — M6259 Muscle wasting and atrophy, not elsewhere classified, multiple sites: Secondary | ICD-10-CM | POA: Diagnosis not present

## 2017-06-04 DIAGNOSIS — N319 Neuromuscular dysfunction of bladder, unspecified: Secondary | ICD-10-CM | POA: Diagnosis not present

## 2017-06-04 DIAGNOSIS — I2510 Atherosclerotic heart disease of native coronary artery without angina pectoris: Secondary | ICD-10-CM | POA: Diagnosis not present

## 2017-06-07 ENCOUNTER — Telehealth: Payer: Self-pay | Admitting: *Deleted

## 2017-06-07 DIAGNOSIS — R296 Repeated falls: Secondary | ICD-10-CM | POA: Diagnosis not present

## 2017-06-07 DIAGNOSIS — G20 Parkinson's disease: Secondary | ICD-10-CM | POA: Diagnosis not present

## 2017-06-07 DIAGNOSIS — I2510 Atherosclerotic heart disease of native coronary artery without angina pectoris: Secondary | ICD-10-CM | POA: Diagnosis not present

## 2017-06-07 DIAGNOSIS — M6259 Muscle wasting and atrophy, not elsewhere classified, multiple sites: Secondary | ICD-10-CM | POA: Diagnosis not present

## 2017-06-07 DIAGNOSIS — R2689 Other abnormalities of gait and mobility: Secondary | ICD-10-CM | POA: Diagnosis not present

## 2017-06-07 DIAGNOSIS — N319 Neuromuscular dysfunction of bladder, unspecified: Secondary | ICD-10-CM | POA: Diagnosis not present

## 2017-06-07 NOTE — Telephone Encounter (Signed)
Ok to do this. Thanks. 

## 2017-06-07 NOTE — Telephone Encounter (Signed)
Daisy physical therapist with [facility] left a voicemail stating that patient mixed a visit last week and wants to know if she can carry that visit over to this week? Please call back with verbal okay.

## 2017-06-08 NOTE — Telephone Encounter (Signed)
Spoke to [place] and provided verbal orders

## 2017-06-09 DIAGNOSIS — N319 Neuromuscular dysfunction of bladder, unspecified: Secondary | ICD-10-CM | POA: Diagnosis not present

## 2017-06-09 DIAGNOSIS — M6259 Muscle wasting and atrophy, not elsewhere classified, multiple sites: Secondary | ICD-10-CM | POA: Diagnosis not present

## 2017-06-09 DIAGNOSIS — R2689 Other abnormalities of gait and mobility: Secondary | ICD-10-CM | POA: Diagnosis not present

## 2017-06-09 DIAGNOSIS — G20 Parkinson's disease: Secondary | ICD-10-CM | POA: Diagnosis not present

## 2017-06-09 DIAGNOSIS — R296 Repeated falls: Secondary | ICD-10-CM | POA: Diagnosis not present

## 2017-06-09 DIAGNOSIS — I2510 Atherosclerotic heart disease of native coronary artery without angina pectoris: Secondary | ICD-10-CM | POA: Diagnosis not present

## 2017-06-10 DIAGNOSIS — R2689 Other abnormalities of gait and mobility: Secondary | ICD-10-CM | POA: Diagnosis not present

## 2017-06-10 DIAGNOSIS — R296 Repeated falls: Secondary | ICD-10-CM | POA: Diagnosis not present

## 2017-06-10 DIAGNOSIS — G20 Parkinson's disease: Secondary | ICD-10-CM | POA: Diagnosis not present

## 2017-06-10 DIAGNOSIS — I2510 Atherosclerotic heart disease of native coronary artery without angina pectoris: Secondary | ICD-10-CM | POA: Diagnosis not present

## 2017-06-10 DIAGNOSIS — N319 Neuromuscular dysfunction of bladder, unspecified: Secondary | ICD-10-CM | POA: Diagnosis not present

## 2017-06-10 DIAGNOSIS — M6259 Muscle wasting and atrophy, not elsewhere classified, multiple sites: Secondary | ICD-10-CM | POA: Diagnosis not present

## 2017-06-11 DIAGNOSIS — R2689 Other abnormalities of gait and mobility: Secondary | ICD-10-CM | POA: Diagnosis not present

## 2017-06-11 DIAGNOSIS — R296 Repeated falls: Secondary | ICD-10-CM | POA: Diagnosis not present

## 2017-06-11 DIAGNOSIS — M6259 Muscle wasting and atrophy, not elsewhere classified, multiple sites: Secondary | ICD-10-CM | POA: Diagnosis not present

## 2017-06-11 DIAGNOSIS — G20 Parkinson's disease: Secondary | ICD-10-CM | POA: Diagnosis not present

## 2017-06-11 DIAGNOSIS — I2510 Atherosclerotic heart disease of native coronary artery without angina pectoris: Secondary | ICD-10-CM | POA: Diagnosis not present

## 2017-06-11 DIAGNOSIS — N319 Neuromuscular dysfunction of bladder, unspecified: Secondary | ICD-10-CM | POA: Diagnosis not present

## 2017-06-14 ENCOUNTER — Ambulatory Visit (INDEPENDENT_AMBULATORY_CARE_PROVIDER_SITE_OTHER): Payer: Medicare Other | Admitting: Cardiovascular Disease

## 2017-06-14 ENCOUNTER — Encounter: Payer: Self-pay | Admitting: Cardiovascular Disease

## 2017-06-14 VITALS — BP 128/70 | HR 70 | Ht 62.0 in | Wt 145.0 lb

## 2017-06-14 DIAGNOSIS — I2510 Atherosclerotic heart disease of native coronary artery without angina pectoris: Secondary | ICD-10-CM | POA: Diagnosis not present

## 2017-06-14 NOTE — Progress Notes (Signed)
Cardiology Office Note Date:  06/14/2017   ID:  [name] [name], DOB 22-May-1941, MRN [id number]  PCP:  Ria Bush, MD  Cardiologist:  [name], MD    Chief Complaint  Patient presents with  . Follow-up    CAD     History of Present Illness: [name] [name] is a 76 y.o. female who presents for followup evaluation. She is followed for coronary artery disease. 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 She is wheelchair bound.   The patient is here with her husband today. She's been to the ER twice since I've seen her last, once for a mechanical fall and another time for slurred speech. Imaging studies did not show a stroke.   She denies chest pain, chest pressure, or shortness of breath. She has chronic leg edema related to mechanical issues with being wheelchair-bound. Reports no changes.   Past Medical History:  Diagnosis Date  . Asthma   . CAD (coronary artery disease)    myoview 5/08:  EF 76%, no scar, no ischemia, +ECG changes with exercise;  cath 04/08/07:  pLAD 30%, mLAD 60-70% - med tx.  . Chronic cystitis    recurrent UTIs, started bactrim ppx (McDiarmid)  . Depression   . Familial tremor    followed by Dr. Erling Cruz  . GERD (gastroesophageal reflux disease)    s/p nissen  . History of pneumonia   . HLD (hyperlipidemia)   . Hydronephrosis, bilateral   . Hypothyroidism   . Internal hemorrhoids   . Multiple system atrophy [facility])    [facility], now [name]  . Neurogenic bladder    Tannenbaum/MacDiarmid  . Osteoporosis 2016   DEXA T-4.4 spine deteriorated since 2012  . Unspecified chronic bronchitis ([place])   . VAGINITIS, ATROPHIC 11/07/2007    Past Surgical History:  Procedure Laterality Date  . COLONOSCOPY N/A 08/29/2013   int hemm; Inda Castle, MD  . CYSTECTOMY    . DEXA  12/2014   T -4.4 spine  . ESOPHAGOGASTRODUODENOSCOPY N/A 01/09/2015   Procedure: ESOPHAGOGASTRODUODENOSCOPY (EGD);   Surgeon: Inda Castle, MD;  Location: [facility];  Service: Endoscopy;  Laterality: N/A;  help with transfers  . LAPAROSCOPIC NISSEN FUNDOPLICATION    . TUBAL LIGATION    . TUMOR REMOVAL      Current Outpatient Prescriptions  Medication Sig Dispense Refill  . aspirin EC 81 MG tablet Take 81 mg by mouth every other day.    . clonazePAM (KLONOPIN) 0.5 MG tablet Take 0.5 mg by mouth daily. May take 2 tablets daily if tremors are severe    . dexlansoprazole (DEXILANT) 60 MG capsule Take 1 capsule (60 mg total) by mouth daily as needed (GERD).    . famotidine (PEPCID) 40 MG tablet Take 1 tablet (40 mg total) by mouth daily.    . furosemide (LASIX) 20 MG tablet Take 1 tablet (20 mg total) by mouth daily as needed for fluid or edema. 30 tablet 0  . hyoscyamine (LEVSIN SL) 0.125 MG SL tablet PLACE 2 TABLETS (0.25 MG TOTAL) UNDER THE TONGUE EVERY 4 (FOUR) HOURS AS NEEDED (SPASMS). 30 tablet 5  . isosorbide mononitrate (IMDUR) 30 MG 24 hr tablet TAKE 1/2 TABLET BY MOUTH DAILY 30 tablet 2  . Melatonin 3 MG TABS Take 1 tablet (3 mg total) by mouth at bedtime.    Marland Kitchen MYRBETRIQ 50 MG TB24 tablet Take 50 mg by mouth daily.  11  . nitroGLYCERIN (  NITROSTAT) 0.4 MG SL tablet Place 1 tablet (0.4 mg total) under the tongue every 5 (five) minutes as needed (chest pain). 25 tablet 3  . polyethylene glycol powder (GLYCOLAX/MIRALAX) powder Take 17 g by mouth daily as needed for moderate constipation. 250 g 1  . PROAIR HFA 108 (90 Base) MCG/ACT inhaler INHALE 2 PUFFS INTO THE LUNGS EVERY 6 (SIX) HOURS AS NEEDED FOR WHEEZING OR SHORTNESS OF BREATH 8.5 Inhaler 0  . propranolol (INDERAL) 10 MG tablet Take 10 mg by mouth 3 (three) times daily as needed (tremors).    . rosuvastatin (CRESTOR) 20 MG tablet Take 0.5 tablets (10 mg total) by mouth daily. 45 tablet 3  . sertraline (ZOLOFT) 25 MG tablet TAKE 1 TABLET (25 MG TOTAL) BY MOUTH DAILY. 90 tablet 3  . SYNTHROID 112 MCG tablet TAKE 1 TABLET (112 MCG TOTAL) BY MOUTH  DAILY BEFORE BREAKFAST. 30 tablet 6  . trimethoprim (TRIMPEX) 100 MG tablet Take 100 mg by mouth daily.  11   No current facility-administered medications for this visit.     Allergies:   Keflex [cephalexin]   Social History:  The patient  reports that she has never smoked. She has never used smokeless tobacco. She reports that she does not drink alcohol or use drugs.   Family History:  The patient's family history includes Breast cancer in her maternal aunt; Colon cancer in her maternal aunt; Coronary artery disease in her daughter and father; Diabetes in her daughter; Heart attack in her daughter and father; Heart disease in her father; Hypertension in her mother; Multiple sclerosis in her daughter; Stroke in her daughter.    ROS:  Please see the history of present illness.  Otherwise, review of systems is positive for weakness and fatigue.  All other systems are reviewed and negative.    PHYSICAL EXAM: VS:  BP 128/70   Pulse 70   Ht 5' 2"  (1.575 m)   Wt 145 lb (65.8 kg)   BMI 26.52 kg/m  , BMI Body mass index is 26.52 kg/m. GEN: Pleasant, elderly woman in wheelchair, in no acute distress  HEENT: normal  Neck: no JVD, no masses. No carotid bruits Cardiac: RRR without murmur or gallop                Respiratory:  clear to auscultation bilaterally, normal work of breathing GI: soft, nontender, nondistended, + BS MS: no deformity or atrophy  Ext: 1+ bilateral pretibial edema, pedal pulses 2+= bilaterally Skin: warm and dry, no rash Neuro:  Strength and sensation are intact Psych: euthymic mood, full affect  EKG:  EKG is not ordered today. The ekg from April 14, 2017 is reviewed and shows NSR 70 bpm with early transition, no ST-T changes  Recent Labs: 07/28/2016: TSH 0.92 05/18/2017: ALT 14; BUN 15; Creatinine, Ser 0.88; Hemoglobin 13.6; Platelets 283.0; Potassium 4.7; Sodium 139   Lipid Panel     Component Value Date/Time   CHOL 122 07/28/2016 1430   TRIG 96.0 07/28/2016  1430   HDL 46.50 07/28/2016 1430   CHOLHDL 3 07/28/2016 1430   VLDL 19.2 07/28/2016 1430   LDLCALC 56 07/28/2016 1430   LDLDIRECT 161.5 06/09/2010 1517      Wt Readings from Last 3 Encounters:  06/14/17 145 lb (65.8 kg)  04/14/17 145 lb (65.8 kg)  03/07/17 145 lb (65.8 kg)     ASSESSMENT AND PLAN: 1.  CAD, native vessel, without angina: We'll continue the combination of aspirin, isosorbide, metoprolol, and a statin  drug.  2. Hypertension: Blood pressure is well controlled on current Rx.   3. Hyperlipidemia: The patient is treated with rosuvastatin. Last lipid showed an LDL cholesterol 56 mg/dL.  Current medicines are reviewed with the patient today.  The patient does not have concerns regarding medicines.  Labs/ tests ordered today include:  No orders of the defined types were placed in this encounter.   Disposition:   FU 6 months  Signed, [name], MD  06/14/2017 4:48 PM    [facility] [place], [place], [place]  [address] Phone: [phone]; Fax: [phone]

## 2017-06-14 NOTE — Patient Instructions (Signed)
Medication Instructions:  Your physician recommends that you continue on your current medications as directed. Please refer to the Current Medication list given to you today.  Labwork: No new orders.   Testing/Procedures: No new orders.   Follow-Up: Your physician wants you to follow-up in: 6  MONTHS with Dr Burt Knack.  You will receive a reminder letter in the mail two months in advance. If you don't receive a letter, please call our office to schedule the follow-up appointment.   Any Other Special Instructions Will Be Listed Below (If Applicable).     If you need a refill on your cardiac medications before your next appointment, please call your pharmacy.

## 2017-06-17 DIAGNOSIS — R2689 Other abnormalities of gait and mobility: Secondary | ICD-10-CM | POA: Diagnosis not present

## 2017-06-17 DIAGNOSIS — R296 Repeated falls: Secondary | ICD-10-CM | POA: Diagnosis not present

## 2017-06-17 DIAGNOSIS — N319 Neuromuscular dysfunction of bladder, unspecified: Secondary | ICD-10-CM | POA: Diagnosis not present

## 2017-06-17 DIAGNOSIS — G20 Parkinson's disease: Secondary | ICD-10-CM | POA: Diagnosis not present

## 2017-06-17 DIAGNOSIS — I2510 Atherosclerotic heart disease of native coronary artery without angina pectoris: Secondary | ICD-10-CM | POA: Diagnosis not present

## 2017-06-17 DIAGNOSIS — M6259 Muscle wasting and atrophy, not elsewhere classified, multiple sites: Secondary | ICD-10-CM | POA: Diagnosis not present

## 2017-06-30 ENCOUNTER — Other Ambulatory Visit: Payer: Self-pay | Admitting: Family Medicine

## 2017-06-30 NOTE — Telephone Encounter (Signed)
Per chart; pt reported "not taking" and is on historical list. Last OV 05/2017. pls advise

## 2017-07-01 NOTE — Telephone Encounter (Signed)
plz phone in. 

## 2017-07-01 NOTE — Telephone Encounter (Signed)
Rx called in to requested pharmacy 

## 2017-07-03 ENCOUNTER — Other Ambulatory Visit: Payer: Self-pay | Admitting: Family Medicine

## 2017-07-04 ENCOUNTER — Other Ambulatory Visit: Payer: Self-pay | Admitting: Family Medicine

## 2017-07-05 NOTE — Telephone Encounter (Signed)
Last filled 05-01-17 #60 Last OV 05-18-17 Next OV 07-23-17

## 2017-07-05 NOTE — Telephone Encounter (Signed)
plz phone in. 

## 2017-07-06 NOTE — Telephone Encounter (Signed)
Rx called in to requested pharmacy 

## 2017-07-11 ENCOUNTER — Other Ambulatory Visit: Payer: Self-pay | Admitting: Cardiovascular Disease

## 2017-07-11 ENCOUNTER — Other Ambulatory Visit: Payer: Self-pay | Admitting: Family Medicine

## 2017-07-17 ENCOUNTER — Other Ambulatory Visit: Payer: Self-pay | Admitting: Internal Medicine

## 2017-07-22 MED ORDER — POLYETHYLENE GLYCOL 3350 17 GM/SCOOP PO POWD: ORAL | 0 refills | Status: DC

## 2017-07-22 NOTE — Telephone Encounter (Signed)
Mr Luckenbaugh said [facility] did not get polyethylene powder, I spoke with [place] at [facility] and gave verbal order. Mr [name] voiced understanding.

## 2017-07-22 NOTE — Addendum Note (Signed)
Addended by: Helene Shoe on: 07/22/2017 11:53 AM   Modules accepted: Orders

## 2017-07-23 ENCOUNTER — Ambulatory Visit: Payer: Medicare Other | Admitting: Family Medicine

## 2017-07-30 ENCOUNTER — Other Ambulatory Visit: Payer: Self-pay | Admitting: Family Medicine

## 2017-07-30 ENCOUNTER — Ambulatory Visit (INDEPENDENT_AMBULATORY_CARE_PROVIDER_SITE_OTHER): Payer: Medicare Other | Admitting: Family Medicine

## 2017-07-30 ENCOUNTER — Encounter: Payer: Self-pay | Admitting: Family Medicine

## 2017-07-30 VITALS — BP 126/76 | HR 71 | Temp 97.6°F

## 2017-07-30 DIAGNOSIS — G239 Degenerative disease of basal ganglia, unspecified: Secondary | ICD-10-CM

## 2017-07-30 DIAGNOSIS — E538 Deficiency of other specified B group vitamins: Secondary | ICD-10-CM | POA: Diagnosis not present

## 2017-07-30 DIAGNOSIS — G903 Multi-system degeneration of the autonomic nervous system: Secondary | ICD-10-CM

## 2017-07-30 DIAGNOSIS — R531 Weakness: Secondary | ICD-10-CM

## 2017-07-30 DIAGNOSIS — R5383 Other fatigue: Secondary | ICD-10-CM

## 2017-07-30 DIAGNOSIS — Z23 Encounter for immunization: Secondary | ICD-10-CM

## 2017-07-30 DIAGNOSIS — F5104 Psychophysiologic insomnia: Secondary | ICD-10-CM

## 2017-07-30 NOTE — Progress Notes (Signed)
BP 126/76 (BP Location: Left Arm, Patient Position: Sitting, Cuff Size: Normal)   Pulse 71   Temp 97.6 F (36.4 C) (Oral)   SpO2 91%    CC: 3 mo f/u visit Subjective:    Patient ID: [name] [name], female    DOB: 25-Jul-1941, 76 y.o.   MRN: [id number]  HPI: [name] [name] is a 76 y.o. female presenting on 07/30/2017 for 3 mo follow-up (Requests vit B12 inj)   Here with husband and daughter.  [facility] finished therapy at home. She has not done outpatient physical therapy - too weak to attend.   Saw Dr Burt Knack cardiology, note reviewed.   More short winded recently. Especially noted with activity.  Ongoing trouble sleeping. Melatonin worsened sleep. Klonopin can help her sleep at night time. She usually takes this at 1pm. Asks about scheduled BID. General malaise and fatigue.   Relevant past medical, surgical, family and social history reviewed and updated as indicated. Interim medical history since our last visit reviewed. Allergies and medications reviewed and updated. Outpatient Medications Prior to Visit  Medication Sig Dispense Refill  . albuterol (PROAIR HFA) 108 (90 Base) MCG/ACT inhaler INHALE 2 PUFFS INTO THE LUNGS EVERY 6 (SIX) HOURS AS NEEDED FOR WHEEZING OR SHORTNESS OF BREATH 8.5 Inhaler 0  . aspirin EC 81 MG tablet Take 81 mg by mouth every other day.    . clonazePAM (KLONOPIN) 0.5 MG tablet TAKE 1-2 TABLETS BY MOUTH DAILY AS NEEDED 30 tablet 0  . DEXILANT 60 MG capsule TAKE 1 CAPSULE (60 MG TOTAL) BY MOUTH DAILY. 30 capsule 2  . dexlansoprazole (DEXILANT) 60 MG capsule Take 1 capsule (60 mg total) by mouth daily as needed (GERD).    . famotidine (PEPCID) 40 MG tablet Take 1 tablet (40 mg total) by mouth daily.    . furosemide (LASIX) 20 MG tablet Take 1 tablet (20 mg total) by mouth daily as needed for fluid or edema. 30 tablet 0  . hyoscyamine (LEVSIN SL) 0.125 MG SL tablet PLACE 2 TABLETS (0.25 MG TOTAL) UNDER THE TONGUE EVERY 4 (FOUR) HOURS AS NEEDED (SPASMS).  30 tablet 5  . isosorbide mononitrate (IMDUR) 30 MG 24 hr tablet TAKE 1/2 TABLET BY MOUTH DAILY 15 tablet 11  . Melatonin 3 MG TABS Take 1 tablet (3 mg total) by mouth at bedtime.    Marland Kitchen MYRBETRIQ 50 MG TB24 tablet Take 50 mg by mouth daily.  11  . nitroGLYCERIN (NITROSTAT) 0.4 MG SL tablet Place 1 tablet (0.4 mg total) under the tongue every 5 (five) minutes as needed (chest pain). 25 tablet 3  . polyethylene glycol powder (GLYCOLAX/MIRALAX) powder DISSOLVE 17 G (1 CAPFUL) IN 8 OUNCES OF LIQUID & DRINK BY MOUTH DAILY AS NEEDED FOR CONSTIPATION. 255 g 0  . propranolol (INDERAL) 10 MG tablet Take 10 mg by mouth 3 (three) times daily as needed (tremors).    . rosuvastatin (CRESTOR) 20 MG tablet Take 0.5 tablets (10 mg total) by mouth daily. 45 tablet 3  . sertraline (ZOLOFT) 25 MG tablet TAKE 1 TABLET (25 MG TOTAL) BY MOUTH DAILY. 90 tablet 3  . SYNTHROID 112 MCG tablet TAKE 1 TABLET (112 MCG TOTAL) BY MOUTH DAILY BEFORE BREAKFAST. 30 tablet 6  . trimethoprim (TRIMPEX) 100 MG tablet Take 100 mg by mouth daily.  11   No facility-administered medications prior to visit.      Per HPI unless specifically indicated in ROS section below Review of Systems  Objective:    BP 126/76 (BP Location: Left Arm, Patient Position: Sitting, Cuff Size: Normal)   Pulse 71   Temp 97.6 F (36.4 C) (Oral)   SpO2 91%   Wt Readings from Last 3 Encounters:  06/14/17 145 lb (65.8 kg)  04/14/17 145 lb (65.8 kg)  03/07/17 145 lb (65.8 kg)    Physical Exam  Constitutional: She appears well-developed and well-nourished. No distress.  Deconditioned in wheelchair  HENT:  Mouth/Throat: Oropharynx is clear and moist. No oropharyngeal exudate.  Eyes: Pupils are equal, round, and reactive to light. Conjunctivae and EOM are normal. No scleral icterus.  Cardiovascular: Normal rate, regular rhythm, normal heart sounds and intact distal pulses.   No murmur heard. Pulmonary/Chest: Effort normal and breath sounds normal.  No respiratory distress. She has no wheezes. She has no rales.  Abdominal: Soft. There is no tenderness.  Musculoskeletal: She exhibits edema (1+).  Skin: Skin is warm and dry. No rash noted.  Psychiatric: She has a normal mood and affect.  Nursing note and vitals reviewed.  Results for orders placed or performed in visit on 05/18/17  Comprehensive metabolic panel  Result Value Ref Range   Sodium 139 135 - 145 mEq/L   Potassium 4.7 3.5 - 5.1 mEq/L   Chloride 103 96 - 112 mEq/L   CO2 28 19 - 32 mEq/L   Glucose, Bld 110 (H) 70 - 99 mg/dL   BUN 15 6 - 23 mg/dL   Creatinine, Ser 0.88 0.40 - 1.20 mg/dL   Total Bilirubin 0.4 0.2 - 1.2 mg/dL   Alkaline Phosphatase 110 39 - 117 U/L   AST 16 0 - 37 U/L   ALT 14 0 - 35 U/L   Total Protein 6.7 6.0 - 8.3 g/dL   Albumin 4.1 3.5 - 5.2 g/dL   Calcium 10.1 8.4 - 10.5 mg/dL   GFR 66.22 >60.00 mL/min  Sedimentation rate  Result Value Ref Range   Sed Rate 7 0 - 30 mm/hr  VITAMIN D 25 Hydroxy (Vit-D Deficiency, Fractures)  Result Value Ref Range   VITD 111.87 (HH) 30.00 - 100.00 ng/mL  CBC with Differential/Platelet  Result Value Ref Range   WBC 5.9 4.0 - 10.5 K/uL   RBC 4.63 3.87 - 5.11 Mil/uL   Hemoglobin 13.6 12.0 - 15.0 g/dL   HCT 40.2 36.0 - 46.0 %   MCV 86.8 78.0 - 100.0 fl   MCHC 33.9 30.0 - 36.0 g/dL   RDW 13.8 11.5 - 15.5 %   Platelets 283.0 150.0 - 400.0 K/uL   Neutrophils Relative % 57.7 43.0 - 77.0 %   Lymphocytes Relative 32.0 12.0 - 46.0 %   Monocytes Relative 8.6 3.0 - 12.0 %   Eosinophils Relative 1.2 0.0 - 5.0 %   Basophils Relative 0.5 0.0 - 3.0 %   Neutro Abs 3.4 1.4 - 7.7 K/uL   Lymphs Abs 1.9 0.7 - 4.0 K/uL   Monocytes Absolute 0.5 0.1 - 1.0 K/uL   Eosinophils Absolute 0.1 0.0 - 0.7 K/uL   Basophils Absolute 0.0 0.0 - 0.1 K/uL   *Note: Due to a large number of results and/or encounters for the requested time period, some results have not been displayed. A complete set of results can be found in Results Review.       Assessment & Plan:   Problem List Items Addressed This Visit    Chronic insomnia    No improvement on melatonin. Will change klonopin 0.35m to bedtime to see if  any improvement in sleep. If no better, will likely add trazodone for sleep.       General weakness   Multiple system atrophy ([place]) - Primary    Reviewed progression. Noting increased dyspnea without signs of CHF or PNA. Husband remains frustrated with limited treatment options.       Other fatigue    Ongoing, chronic. Anticipate progression of MSA. Continue to monitor.       Vitamin B12 deficiency    b12 shot today.       Other Visit Diagnoses    Need for influenza vaccination       Relevant Orders   Flu Vaccine QUAD 6+ mos PF IM (Fluarix Quad PF) (Completed)       Follow up plan: Return in about 3 months (around 10/29/2017) for follow up visit.  Ria Bush, MD

## 2017-07-30 NOTE — Assessment & Plan Note (Addendum)
Reviewed progression. Noting increased dyspnea without signs of CHF or PNA. Husband remains frustrated with limited treatment options.

## 2017-07-30 NOTE — Telephone Encounter (Signed)
Refill left on vm at pharmacy.

## 2017-07-30 NOTE — Patient Instructions (Addendum)
Flu shot today B12 shot today Change klonopin to nightly. Let me know how this helps with sleeping.  Change aspirin to daily with dinner. Let us know if ongoing trouble with stomach.  Return to see me in 3 months.

## 2017-07-30 NOTE — Telephone Encounter (Signed)
plz phone in. 

## 2017-07-30 NOTE — Assessment & Plan Note (Signed)
Ongoing, chronic. Anticipate progression of MSA. Continue to monitor.

## 2017-07-30 NOTE — Assessment & Plan Note (Signed)
No improvement on melatonin. Will change klonopin 0.48m to bedtime to see if any improvement in sleep. If no better, will likely add trazodone for sleep.

## 2017-07-30 NOTE — Assessment & Plan Note (Signed)
b12 shot today.

## 2017-08-27 ENCOUNTER — Other Ambulatory Visit: Payer: Self-pay | Admitting: Family Medicine

## 2017-08-27 NOTE — Telephone Encounter (Signed)
plz phone in. 

## 2017-08-27 NOTE — Telephone Encounter (Signed)
Refill left on vm at pharmacy.

## 2017-08-27 NOTE — Telephone Encounter (Signed)
Last filled:  07/30/17, #30 Last OV:  07/30/17 Next OV:  10/29/17

## 2017-09-23 ENCOUNTER — Other Ambulatory Visit: Payer: Self-pay | Admitting: Family Medicine

## 2017-09-28 ENCOUNTER — Other Ambulatory Visit: Payer: Self-pay | Admitting: *Deleted

## 2017-09-28 ENCOUNTER — Other Ambulatory Visit: Payer: Self-pay | Admitting: Family Medicine

## 2017-09-28 ENCOUNTER — Telehealth: Payer: Self-pay | Admitting: Family Medicine

## 2017-09-28 MED ORDER — ALBUTEROL SULFATE HFA 108 (90 BASE) MCG/ACT IN AERS: INHALATION_SPRAY | RESPIRATORY_TRACT | 0 refills | Status: DC

## 2017-09-28 NOTE — Telephone Encounter (Signed)
Copied from [place] [id number]. Topic: Quick Communication - See Telephone Encounter >> Sep 28, 2017 11:44 AM Ether Griffins B wrote: CRM for notification. See Telephone encounter for:  Pt needing refill on inhaler. Only has 4 puffs left on the one she has.  09/28/17.

## 2017-10-04 ENCOUNTER — Ambulatory Visit: Payer: Self-pay | Admitting: *Deleted

## 2017-10-04 NOTE — Telephone Encounter (Signed)
Called in concerned with elevated BP readings yesterday.  She is having problems with constipation so she took a stool softer then a laxative.  She stated she is in a wheelchair so going back and forth to the bathroom several times yesterday was a job.  "My blood pressure usually is not that high".   "I don't have to take any BP medications just heart medications."  I encouraged her to keep a check on her blood pressure and call us back if it became high.   She verbalized understanding.   Reason for Disposition . Systolic BP between 438-377 with Diastolic between 93-96  Answer Assessment - Initial Assessment Questions 1. BLOOD PRESSURE: "What is the blood pressure?" "Did you take at least two measurements 5 minutes apart?"     Last week 150/86.  Then it went up to 170/80's yesterday.  I took a laxative yesterday and was using the bathroom a lot.  It's an ordeal because I'm in a wheelchair. 2. ONSET: "When did you take your blood pressure?"     Today it's 124/70. 3. HOW: "How did you obtain the blood pressure?" (e.g., visiting nurse, automatic home BP monitor)     Automatic BP machine. 4. HISTORY: "Do you have a history of high blood pressure?"     No history.  No BP meds. 5. MEDICATIONS: "Are you taking any medications for blood pressure?" "Have you missed any doses recently?"     Not taking any. 6. OTHER SYMPTOMS: "Do you have any symptoms?" (e.g., headache, chest pain, blurred vision, difficulty breathing, weakness)     No 7. PREGNANCY: "Is there any chance you are pregnant?" "When was your last menstrual period?"     N/A  Protocols used: HIGH BLOOD PRESSURE-A-AH

## 2017-10-12 ENCOUNTER — Other Ambulatory Visit: Payer: Self-pay | Admitting: Family Medicine

## 2017-10-15 ENCOUNTER — Ambulatory Visit: Payer: Medicare Other | Admitting: Family Medicine

## 2017-10-18 ENCOUNTER — Telehealth: Payer: Self-pay

## 2017-10-18 NOTE — Telephone Encounter (Signed)
I called pt. I advised her that our office will be closed tomorrow due to the weather. I offered to reschedule pt's appt, but she declined and said that she would call us back during regular business hours to reschedule.

## 2017-10-19 ENCOUNTER — Ambulatory Visit: Payer: Self-pay | Admitting: Neurology

## 2017-10-22 ENCOUNTER — Ambulatory Visit: Payer: Medicare Other | Admitting: Family Medicine

## 2017-10-27 ENCOUNTER — Ambulatory Visit: Payer: Medicare Other | Admitting: Family Medicine

## 2017-10-27 ENCOUNTER — Encounter: Payer: Self-pay | Admitting: Family Medicine

## 2017-10-27 ENCOUNTER — Other Ambulatory Visit: Payer: Self-pay | Admitting: Family Medicine

## 2017-10-27 VITALS — BP 122/64 | HR 82 | Temp 97.9°F

## 2017-10-27 DIAGNOSIS — R531 Weakness: Secondary | ICD-10-CM

## 2017-10-27 DIAGNOSIS — R251 Tremor, unspecified: Secondary | ICD-10-CM

## 2017-10-27 DIAGNOSIS — G969 Disorder of central nervous system, unspecified: Secondary | ICD-10-CM

## 2017-10-27 DIAGNOSIS — G239 Degenerative disease of basal ganglia, unspecified: Secondary | ICD-10-CM | POA: Diagnosis not present

## 2017-10-27 DIAGNOSIS — G903 Multi-system degeneration of the autonomic nervous system: Secondary | ICD-10-CM

## 2017-10-27 DIAGNOSIS — E538 Deficiency of other specified B group vitamins: Secondary | ICD-10-CM

## 2017-10-27 MED ORDER — CYANOCOBALAMIN 1000 MCG/ML IJ SOLN
1000.0000 ug | Freq: Once | INTRAMUSCULAR | Status: AC
  Administered 2017-10-27: 1000 ug via INTRAMUSCULAR

## 2017-10-27 NOTE — Telephone Encounter (Signed)
Pt notified at today's OV

## 2017-10-27 NOTE — Assessment & Plan Note (Signed)
B12 shot today. 

## 2017-10-27 NOTE — Progress Notes (Signed)
BP 122/64 (BP Location: Left Arm, Patient Position: Sitting, Cuff Size: Normal)   Pulse 82   Temp 97.9 F (36.6 C) (Oral)   SpO2 93%    CC: 3 mo f/u visit Subjective:    Patient ID: ILLA ENLOW, female    DOB: Jan 17, 1941, 76 y.o.   MRN: [id number]  HPI: [name] [name] is a 76 y.o. female presenting on 10/27/2017 for 3 mo follow-up (Needs vit B12 inj)   Buried 83 yo mother Monday 10/25/2017.  2 caregivers come to the home from 9am to 10pm.  BP at home has been up and down. Last 3 days better, but SBP have up to 150-170s.   Ongoing constipation - miralax was ineffective. Now taking docusate 2 pills daily with good effect.   Ongoing trouble with constipation. Sleeps in hospital bed. Husband sleeps in sofa next to her.   Relevant past medical, surgical, family and social history reviewed and updated as indicated. Interim medical history since our last visit reviewed. Allergies and medications reviewed and updated. Outpatient Medications Prior to Visit  Medication Sig Dispense Refill  . albuterol (PROAIR HFA) 108 (90 Base) MCG/ACT inhaler INHALE 2 PUFFS INTO THE LUNGS EVERY 6 (SIX) HOURS AS NEEDED FOR WHEEZING OR SHORTNESS OF BREATH 8.5 Inhaler 0  . aspirin EC 81 MG tablet Take 81 mg by mouth every other day.    . clonazePAM (KLONOPIN) 0.5 MG tablet TAKE 1-2 TABLETS BY MOUTH DAILY AS NEEDED 60 tablet 0  . DEXILANT 60 MG capsule TAKE 1 CAPSULE (60 MG TOTAL) BY MOUTH DAILY. 30 capsule 2  . docusate sodium (COLACE) 100 MG capsule Take 200 mg by mouth daily.    . famotidine (PEPCID) 40 MG tablet TAKE 1 TABLET (40 MG TOTAL) BY MOUTH 2 (TWO) TIMES DAILY AS NEEDED FOR HEARTBURN. 180 tablet 3  . furosemide (LASIX) 20 MG tablet Take 1 tablet (20 mg total) by mouth daily as needed for fluid or edema. 30 tablet 0  . hyoscyamine (LEVSIN SL) 0.125 MG SL tablet PLACE 2 TABLETS (0.25 MG TOTAL) UNDER THE TONGUE EVERY 4 (FOUR) HOURS AS NEEDED (SPASMS). 30 tablet 5  . isosorbide mononitrate  (IMDUR) 30 MG 24 hr tablet TAKE 1/2 TABLET BY MOUTH DAILY 15 tablet 11  . Melatonin 3 MG TABS Take 1 tablet (3 mg total) by mouth at bedtime.    Marland Kitchen MYRBETRIQ 50 MG TB24 tablet Take 50 mg by mouth daily.  11  . nitroGLYCERIN (NITROSTAT) 0.4 MG SL tablet Place 1 tablet (0.4 mg total) under the tongue every 5 (five) minutes as needed (chest pain). 25 tablet 3  . propranolol (INDERAL) 10 MG tablet TAKE 1 TABLET BY MOUTH 3 TIMES DAILY AS NEEDED (TREMORS). 180 tablet 0  . rosuvastatin (CRESTOR) 20 MG tablet Take 0.5 tablets (10 mg total) by mouth daily. 45 tablet 3  . sertraline (ZOLOFT) 25 MG tablet TAKE 1 TABLET (25 MG TOTAL) BY MOUTH DAILY. 90 tablet 3  . SYNTHROID 112 MCG tablet TAKE 1 TABLET (112 MCG TOTAL) BY MOUTH DAILY BEFORE BREAKFAST. 30 tablet 6  . trimethoprim (TRIMPEX) 100 MG tablet Take 100 mg by mouth daily.  11  . dexlansoprazole (DEXILANT) 60 MG capsule Take 1 capsule (60 mg total) by mouth daily as needed (GERD).    . famotidine (PEPCID) 40 MG tablet Take 1 tablet (40 mg total) by mouth daily.    . polyethylene glycol powder (GLYCOLAX/MIRALAX) powder DISSOLVE 17 G (1 CAPFUL) IN 8 OUNCES OF LIQUID &  DRINK BY MOUTH DAILY AS NEEDED FOR CONSTIPATION. 255 g 0  . propranolol (INDERAL) 10 MG tablet Take 10 mg by mouth 3 (three) times daily as needed (tremors).     No facility-administered medications prior to visit.      Per HPI unless specifically indicated in ROS section below Review of Systems     Objective:    BP 122/64 (BP Location: Left Arm, Patient Position: Sitting, Cuff Size: Normal)   Pulse 82   Temp 97.9 F (36.6 C) (Oral)   SpO2 93%   Wt Readings from Last 3 Encounters:  06/14/17 145 lb (65.8 kg)  04/14/17 145 lb (65.8 kg)  03/07/17 145 lb (65.8 kg)    Physical Exam  Constitutional: She appears well-developed and well-nourished. No distress.  HENT:  Head: Normocephalic and atraumatic.  Mouth/Throat: Oropharynx is clear and moist. No oropharyngeal exudate.    Cardiovascular: Normal rate, regular rhythm, normal heart sounds and intact distal pulses.  No murmur heard. Pulmonary/Chest: Effort normal and breath sounds normal. No respiratory distress. She has no wheezes. She has no rales.  Musculoskeletal: She exhibits no edema.  Skin: Skin is warm and dry. No rash noted.  Psychiatric: She has a normal mood and affect.  Nursing note and vitals reviewed.  Results for orders placed or performed in visit on 05/18/17  Comprehensive metabolic panel  Result Value Ref Range   Sodium 139 135 - 145 mEq/L   Potassium 4.7 3.5 - 5.1 mEq/L   Chloride 103 96 - 112 mEq/L   CO2 28 19 - 32 mEq/L   Glucose, Bld 110 (H) 70 - 99 mg/dL   BUN 15 6 - 23 mg/dL   Creatinine, Ser 0.88 0.40 - 1.20 mg/dL   Total Bilirubin 0.4 0.2 - 1.2 mg/dL   Alkaline Phosphatase 110 39 - 117 U/L   AST 16 0 - 37 U/L   ALT 14 0 - 35 U/L   Total Protein 6.7 6.0 - 8.3 g/dL   Albumin 4.1 3.5 - 5.2 g/dL   Calcium 10.1 8.4 - 10.5 mg/dL   GFR 66.22 >60.00 mL/min  Sedimentation rate  Result Value Ref Range   Sed Rate 7 0 - 30 mm/hr  VITAMIN D 25 Hydroxy (Vit-D Deficiency, Fractures)  Result Value Ref Range   VITD 111.87 (HH) 30.00 - 100.00 ng/mL  CBC with Differential/Platelet  Result Value Ref Range   WBC 5.9 4.0 - 10.5 K/uL   RBC 4.63 3.87 - 5.11 Mil/uL   Hemoglobin 13.6 12.0 - 15.0 g/dL   HCT 40.2 36.0 - 46.0 %   MCV 86.8 78.0 - 100.0 fl   MCHC 33.9 30.0 - 36.0 g/dL   RDW 13.8 11.5 - 15.5 %   Platelets 283.0 150.0 - 400.0 K/uL   Neutrophils Relative % 57.7 43.0 - 77.0 %   Lymphocytes Relative 32.0 12.0 - 46.0 %   Monocytes Relative 8.6 3.0 - 12.0 %   Eosinophils Relative 1.2 0.0 - 5.0 %   Basophils Relative 0.5 0.0 - 3.0 %   Neutro Abs 3.4 1.4 - 7.7 K/uL   Lymphs Abs 1.9 0.7 - 4.0 K/uL   Monocytes Absolute 0.5 0.1 - 1.0 K/uL   Eosinophils Absolute 0.1 0.0 - 0.7 K/uL   Basophils Absolute 0.0 0.0 - 0.1 K/uL      Assessment & Plan:   Problem List Items Addressed This  Visit    General weakness   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 Now has personal caregivers that come out to the house 9am to 10pm. This is helpful. Support provided.       Tremor due to disorder of central nervous system   Vitamin B12 deficiency    B12 shot today.       Relevant Medications   cyanocobalamin ((VITAMIN B-12)) injection 1,000 mcg (Completed)       Follow up plan: Return in about 3 months (around 01/25/2018), or if symptoms worsen or fail to improve, for follow up visit.  Ria Bush, MD

## 2017-10-27 NOTE — Telephone Encounter (Signed)
Last filled:  09/19/17, #60 Last OV:  07/30/17 Next OV:  today

## 2017-10-27 NOTE — Assessment & Plan Note (Signed)
Overall stable period. Now has personal caregivers that come out to the house 9am to 10pm. This is helpful. Support provided.

## 2017-10-27 NOTE — Patient Instructions (Addendum)
B12 shot today Change klonopin to night time, second dose if needed during the day. Ok to continue docusate (dulcolax) 286m daily.  Continue other medicines as up to now.  Return in 3 months for follow up visit.

## 2017-10-27 NOTE — Telephone Encounter (Signed)
Sent electronically 

## 2017-10-29 ENCOUNTER — Ambulatory Visit: Payer: Medicare Other | Admitting: Family Medicine

## 2017-11-05 ENCOUNTER — Other Ambulatory Visit: Payer: Self-pay | Admitting: Internal Medicine

## 2017-11-05 ENCOUNTER — Telehealth: Payer: Self-pay

## 2017-11-05 NOTE — Telephone Encounter (Signed)
Spoke with pt's husband, [name].  He asked for phone numbers for palliative care in [place]. Provided number for [facility].  Wants to ask them some questions.

## 2017-11-05 NOTE — Telephone Encounter (Signed)
Copied from [place] [id number]. Topic: General - Other >> Nov 05, 2017  9:59 AM Lennox Solders wrote: Reason for CRM: pt husband jeff would like dr g nurse to return his call concerning maybe getting palliative care for his wife

## 2017-11-11 ENCOUNTER — Other Ambulatory Visit: Payer: Self-pay | Admitting: Family Medicine

## 2017-11-27 ENCOUNTER — Other Ambulatory Visit: Payer: Self-pay | Admitting: Family Medicine

## 2017-12-01 ENCOUNTER — Other Ambulatory Visit: Payer: Self-pay | Admitting: Family Medicine

## 2017-12-01 NOTE — Telephone Encounter (Signed)
Last office visit 10/27/17 Last refill 10/27/17 #60

## 2017-12-01 NOTE — Telephone Encounter (Signed)
Pt husband [name] stated pt has only 1 pill left.

## 2017-12-01 NOTE — Telephone Encounter (Signed)
Last filled:  10/27/17, #60 Last OV:  10/27/17 Next OV:  01/25/17

## 2017-12-02 MED ORDER — CLONAZEPAM 1 MG PO TABS: 0.5000 mg | ORAL_TABLET | Freq: Every day | ORAL | 0 refills | Status: DC | PRN

## 2017-12-02 NOTE — Telephone Encounter (Signed)
Refilled higher dose to cut in half. Sent electronically

## 2017-12-02 NOTE — Telephone Encounter (Signed)
[facility]ist called - she is following up on refill requested. Pt is out of medication. Also, [facility] is out of the 0.69m strength, and was asking if you could write for the 1.035mstrength and the pt could break in half. cb for pharmacy is [phone]

## 2017-12-08 ENCOUNTER — Telehealth: Payer: Self-pay | Admitting: Family Medicine

## 2017-12-08 NOTE — Telephone Encounter (Signed)
Copied from [place]. Topic: Quick Communication - See Telephone Encounter >> Dec 08, 2017  3:35 PM Ether Griffins B wrote: CRM for notification. See Telephone encounter for:  Pt calling in stating Klonopin is not helping her sleep and she would like something else called in.  12/08/17.

## 2017-12-10 NOTE — Telephone Encounter (Signed)
Would suggest she try melatonin 66m nightly as needed for sleep. This is over the counter. If not better we can review options at next visit.

## 2017-12-10 NOTE — Telephone Encounter (Signed)
Spoke with pt relaying message per Dr. Darnell Level.  Pt says ok.  Also, mentions she has tried the melatonin before but was not much help.  However, pt states she has some left and will try it again.

## 2017-12-21 ENCOUNTER — Ambulatory Visit: Payer: Medicare Other | Admitting: Family Medicine

## 2017-12-21 ENCOUNTER — Telehealth: Payer: Self-pay | Admitting: *Deleted

## 2017-12-21 NOTE — Telephone Encounter (Signed)
Copied from [place] [id number]. Topic: Quick Communication - Appointment Cancellation >> Dec 21, 2017 11:33 AM Synthia Innocent wrote: Patient called to cancel appointment scheduled for 12/21/17. Patient has rescheduled their appointment. pt in wheel chair, wants to cancel due to weather    Route to department's PEC pool.

## 2017-12-21 NOTE — Telephone Encounter (Signed)
plz don't charge late fee. Thanks.

## 2017-12-21 NOTE — Telephone Encounter (Signed)
Should patient be charged a late cancellation fee?

## 2017-12-27 ENCOUNTER — Telehealth: Payer: Self-pay

## 2017-12-27 ENCOUNTER — Ambulatory Visit: Payer: Medicare Other | Admitting: Family Medicine

## 2017-12-27 ENCOUNTER — Other Ambulatory Visit: Payer: Self-pay | Admitting: Family Medicine

## 2017-12-27 DIAGNOSIS — Z0289 Encounter for other administrative examinations: Secondary | ICD-10-CM

## 2017-12-27 NOTE — Telephone Encounter (Signed)
Patient No Showed/ Canceled appt within 24 hours.   Please determine one of the following actions:   A. No follow-up necessary.  B. Follow-up urgent. Locate patient immediately.  C. Follow-up necessary. Contact patient and schedule visit in _______ days.  D. Follow-up advised. Contact patient and schedule visit in _______ weeks.    Please determine CHARGE or NO CHARGE.    Call came in 12/27/17 at 9:27 to cancel appt today at 3:30 with Dr Darnell Level. I spoke with pt's husband and he said pt is asleep; their heat is off and pt cannot get dressed in house due to cold temps. pts husband said would cb if needed for appt; he did not know if pt lt side pain was better or not due to pt being asleep. FYI to Dr Darnell Level.

## 2017-12-27 NOTE — Telephone Encounter (Signed)
Noted. No charge. 

## 2017-12-27 NOTE — Telephone Encounter (Signed)
Copied from [place] [id number]. Topic: Quick Communication - Appointment Cancellation >> Dec 27, 2017  9:27 AM Darl Householder, RMA wrote: Patient called to cancel appointment scheduled for 12/27/17 @ 3:30 pm. Patient has not rescheduled their appointment.

## 2018-01-04 ENCOUNTER — Ambulatory Visit: Payer: Medicare Other | Admitting: Neurology

## 2018-01-04 ENCOUNTER — Encounter: Payer: Self-pay | Admitting: Neurology

## 2018-01-04 VITALS — BP 140/71 | HR 72

## 2018-01-04 DIAGNOSIS — G20 Parkinson's disease: Secondary | ICD-10-CM | POA: Diagnosis not present

## 2018-01-04 NOTE — Progress Notes (Signed)
Subjective:    Patient ID: [name] [name] is a 77 y.o. female.  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 She is accompanied by her husband again today. I last saw her on 04/19/2017, at which time we talked about fall prevention and her increase and fall risk. She had issues with her sleep and was encouraged to try melatonin for this. She was encouraged to seek a second opinion at [facility] or [facility] or locally here in [place] which they declined at the time. She had some help at the house. I referred her to home health as well. She had ER visits in June 2018 as well as April 2018.  Today, 01/04/2018: She reports leaning is worse, typically to the L, causing neck pain, uses Aspercreme as needed. Sadly, her mother passed away in 11/11/17, nearly 15 years old, she was in skilled nursing. She has help at the house, usually from morning through about 10 PM. She felt home health physical therapy was helpful, she would like to restart this [facility]. She has had a cold but no serious acute illness recently, appetite is still good, no recent episode of choking.  The patient's allergies, current medications, family history, past medical history, past social history, past surgical history and problem list were reviewed and updated as appropriate.    Previously (copied from previous notes for reference):   I saw her on 10/19/2016, at which time she reported stress d/t her husband's condition. He needed surgery, and had to be in the hospital for about 2 weeks. They  had some help at the house, from 9-4 and from 4 to 10 PM. Son lives with them, their daughter passed away three years ago. She had tried baclofen on a few occasions but was not convinced it was helping the episodes of spasms and feelings of chills.      I saw her on 04/28/2016, at which time she reported having had chills, felt cold all the time, thyroid function was checked by PCP about 3 months prior. She was not sleeping very well. Her husband was having a difficult time caring for her. He had lost weight and reported more stress. She was checked by cardiology and was stable from a cardiac standpoint. She had received a B12 shot recently through PCP. She went to the emergency room on 03/17/2016 for uncontrollable chills. For muscle spasms I suggested she try low-dose baclofen.     I saw her on 09/18/2015, at which time she reported muscle spasms in various places. They did not feel like cramps. She was in the interim seen by [name], nurse practitioner, on 01/22/2016.    I saw her on 07/29/2015, at which time she reported doing fairly well. No choking, no recent falls and he her husband was helping her with mobilizing with a walker. She had to go to the emergency room on 04/11/2015 for UTI. She was in outpatient physical therapy. She was seeing her urologist regularly and they had talked about Botox injections and potentially a surgically placed catheter. She had no headaches. She had a follow-up phone call from [facility] about a potential research study but she did not get encouragement to pursue it from the doctor. In the interim, she calls in October 2016 reporting a 2-4 day history of increased muscle spasms. She felt that she had spasms all over. She was encouraged to see her primary care provider. She was seen and had blood work which showed no abnormality of her electrolytes, kidney function. Her magnesium level was normal.     I saw her on 03/27/2015, at which time she reported new left  hand numbness for the past 1 month. She woke up with left hand symptoms. She had some numbness and tingling. Her husband noted that she was leaning to the left. Her last brain MRI was on 07/30/2015 which showed no acute abnormality. I reviewed the MRI last time. She denied any slurring of speech, droopy face, lower extremity weakness. She had some lower extremity swelling which was worse. Her left hand numbness improved after a few days. Her leaning improved. She had finished physical therapy in April 2016 and felt it was helpful and wondered if she could restart it. I referred her back to physical therapy outpatient. I suggested we proceed with a brain MRI. She had a brain MRI without contrast on 04/23/2015 and we called her subsequently with her test results: Abnormal MRI brain (without) demonstrating:   1. Small left fronto-parietal, parasagittal focus of encephalomalacia and gliosis, possible a chronic ischemic infarction.   2. Few scattered periventricular foci of non-specific gliosis. 3. No acute findings. 4. No significant change from MRI on 07/29/12.   In addition, I personally reviewed the images through the PACS system.   I saw her on 12/11/2014, at which time she reported feeling about the same as far as her motor symptoms. She had more freezing, especially in her right leg. She was in physical therapy and felt it was helpful. She was trying to do stretching at home. Her husband reported that he does not feel comfortable leaving her at home alone. They were in touch with [facility] over the phone but there were no updates on research for multiple system atrophy. She has a call alert button. She had no recent falls. She was having trouble going to sleep. She was sleeping in her lift chair. She had new reading glasses. She had diarrhea in the past when she tried Linzess through her GI doctor. She requested a sooner than scheduled appointment due to abnormal posture and left-sided numbness.   I saw  her on 07/05/2014, at which time she reported having had more falls. She had not been seen at the [facility] clinic. Stem cell trial was discussed and another trial in [place] with alpha-synuclein depleting therapy at the last visit there. She was eating well and her spirits were good. We restarted outpatient physical therapy and she has been going to the neuro rehabilitation unit.    I saw her on 02/02/2014, at which time she reported that physical therapy was helpful. She had fallen a few times and bruised her ribs. She had a colonoscopy on 08/29/2013. She reported a history of hemorrhoids and chronic constipation for which she had tried Dulcolax. I referred her back to physical therapy. 4 chronic constipation I asked her to try Linzess. I did not suggest any other new medications. She goes to the [facility] once a year and had an appointment in on 04/27/14 and I reviewed the note and shared the data with them. She had previously tried and failed Sinemet.  I saw her on 09/05/2013, at which time she presented for a sooner than scheduled appointment due to recurrent falls. She had fallen 6 times within about 1-1/2 weeks' time. 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 I advised her to use her walker at all times. We talked about potentially going to a motorized wheelchair down the [place]. She was advised to drink more water. She had tried and failed Sinemet in the past. I suggested a second opinion at [facility] which they declined. I referred her to physical and occupational therapy outpatient. In the interim, I prescribed a light weight wheelchair for her. Her daughter recently died. She had advanced MS.   I first met her on 05/10/2013, at which time we discussed the possibility of MSA. I did not make any medication changes. We talked about the nature of progressive parkinsonism and gait disorder. She had  her last outpatient PT in July 2014. She tried to do stretching at home. She had a colonoscopy.   She previously followed with Dr. Morene Antu and was last seen by him on 12/30/2012, at which time he felt that she had worsening of her gait. She also had symptoms of vertigo. She has been on baby aspirin, Synthroid, Crestor, and/or, clonazepam, propranolol, sertraline, trimethoprim, Nasonex, Provera, nitroglycerin as needed, vitamin B12.   She was diagnosed about 20 years ago with essential tremor. She had head tremor more than upper extremity tremor. She has a history of lung disease but was able to take low-dose propranolol. She has not tried primidone. She has had gait and balance problems in the last 5+ years approximately. She has to hold onto something. She has had bladder incontinence since her 25s. She was on Crestor for 3 years which was discontinued in June 2011. CK was normal and EMG nerve conduction studies were normal in June 2011. Crestor was restarted. She has been on B12 injections without subjective improvement. MRI brain with and without contrast showed hyperostosis frontalis, MRI C-spine showed mild degenerative joint disease. 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 Lumbar spine MRI from July 2012 showed mild degenerative disc disease. 4 gait dysfunction she was referred to Dr. Gilford Rile with a question of myelopathy versus orthostatic tremor versus lower half parkinsonism versus essential tremor with gait disorder. 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 She was seen at the [facility] in August 2013 and was felt to have a form of parkinsonism, possibly MSA with abnormal sweat testing. Further testing revealed negative tilt table test but there was mild autonomic neuropathy per autonomic reflex screen. She has had swelling in both legs with negative Doppler of the legs  and normal echocardiogram. Additional workup at [facility] included vitamin D, paraneoplastic antibodies, anti-GAD, all negative.   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 She has been using a 2 wheeled walker for the past 2 years.   She has been in PT and OT with improvement.   She endorsed a lot of stress, due primarily to her mother's and daughter's health, who died in March 01, 2014. Mother is in her 31s and in a NH. She sleeps in a recliner, d/t pain in her trunk, back, neck and stiffness and  difficulty getting in and out of bed.  Her Past Medical History Is Significant For: Past Medical History:  Diagnosis Date  . Asthma   . CAD (coronary artery disease)    myoview 5/08:  EF 76%, no scar, no ischemia, +ECG changes with exercise;  cath 04/08/07:  pLAD 30%, mLAD 60-70% - med tx.  . Chronic cystitis    recurrent UTIs, started bactrim ppx (McDiarmid)  . Depression   . Familial tremor    followed by Dr. Erling Cruz  . GERD (gastroesophageal reflux disease)    s/p nissen  . History of pneumonia   . HLD (hyperlipidemia)   . Hydronephrosis, bilateral   . Hypothyroidism   . Internal hemorrhoids   . 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)    [facility], now   . Neurogenic bladder    Tannenbaum/MacDiarmid  . Osteoporosis 2016   DEXA T-4.4 spine deteriorated since 2012  . Unspecified chronic bronchitis ([place])   . VAGINITIS, ATROPHIC 11/07/2007    Her Past Surgical History Is Significant For: Past Surgical History:  Procedure Laterality Date  . COLONOSCOPY N/A 08/29/2013   int hemm; Inda Castle, MD  . CYSTECTOMY    . DEXA  12/2014   T -4.4 spine  . ESOPHAGOGASTRODUODENOSCOPY N/A 01/09/2015   Procedure: ESOPHAGOGASTRODUODENOSCOPY (EGD);  Surgeon: Inda Castle, MD;  Location: [facility];  Service: Endoscopy;  Laterality: N/A;  help with transfers  . LAPAROSCOPIC NISSEN FUNDOPLICATION    . TUBAL LIGATION    . TUMOR REMOVAL      Her Family  History Is Significant For: Family History  Problem Relation Age of Onset  . Hypertension Mother   . Coronary artery disease Father   . Heart attack Father   . Heart disease Father   . Diabetes Daughter   . Breast cancer Maternal Aunt   . Coronary artery disease Daughter   . Colon cancer Maternal Aunt   . Multiple sclerosis Daughter   . Stroke Daughter   . Heart attack Daughter     Her Social History Is Significant For: Social History   Socioeconomic History  . Marital status: Married    Spouse name: [name]  . Number of children: 2  . Years of education: HS  . Highest education level: None  Social Needs  . Financial resource strain: None  . Food insecurity - worry: None  . Food insecurity - inability: None  . Transportation needs - medical: None  . Transportation needs - non-medical: None  Occupational History    Employer: OTHER  Tobacco Use  . Smoking status: Never Smoker  . Smokeless tobacco: Never Used  Substance and Sexual Activity  . Alcohol use: No    Alcohol/week: 0.0 oz  . Drug use: No  . Sexual activity: Not Currently  Other Topics Concern  . None  Social History Narrative   HSG, retired from [employer] . married '60. 1 son - '65; 1 daughter - '61; 2 grandchildren; 1 great-grandchild. Homemaker. marriage in good health. 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 Positive history of passive tobacco smoke exposure.   Caffeine Use: 1 cup daily; 2 glasses of tea daily    Her Allergies Are:  Allergies  Allergen Reactions  . Keflex [Cephalexin] Diarrhea  :   Her Current Medications Are:  Outpatient Encounter Medications as of 01/04/2018  Medication Sig  . albuterol (PROVENTIL HFA;VENTOLIN HFA) 108 (90 Base) MCG/ACT inhaler TAKE 2 PUFFS BY MOUTH EVERY 6 HOURS AS NEEDED FOR WHEEZE OR SHORTNESS OF BREATH  .  aspirin EC 81 MG tablet Take 81 mg by mouth every other day.  . clonazePAM (KLONOPIN) 1 MG tablet Take 0.5-1 tablets (0.5-1 mg total) by mouth daily as needed for  anxiety.  . DEXILANT 60 MG capsule TAKE 1 CAPSULE (60 MG TOTAL) BY MOUTH DAILY.  Marland Kitchen docusate sodium (COLACE) 100 MG capsule Take 200 mg by mouth daily.  . famotidine (PEPCID) 40 MG tablet TAKE 1 TABLET (40 MG TOTAL) BY MOUTH 2 (TWO) TIMES DAILY AS NEEDED FOR HEARTBURN.  . furosemide (LASIX) 20 MG tablet Take 1 tablet (20 mg total) by mouth daily as needed for fluid or edema.  . hyoscyamine (LEVSIN SL) 0.125 MG SL tablet PLACE 2 TABLETS (0.25 MG TOTAL) UNDER THE TONGUE EVERY 4 (FOUR) HOURS AS NEEDED (SPASMS).  . hyoscyamine (LEVSIN SL) 0.125 MG SL tablet PLACE 2 TABLETS (0.25 MG TOTAL) UNDER THE TONGUE EVERY 4 (FOUR) HOURS AS NEEDED (SPASMS).  . isosorbide mononitrate (IMDUR) 30 MG 24 hr tablet TAKE 1/2 TABLET BY MOUTH DAILY  . Melatonin 3 MG TABS Take 1 tablet (3 mg total) by mouth at bedtime.  Marland Kitchen MYRBETRIQ 50 MG TB24 tablet Take 50 mg by mouth daily.  . nitroGLYCERIN (NITROSTAT) 0.4 MG SL tablet Place 1 tablet (0.4 mg total) under the tongue every 5 (five) minutes as needed (chest pain).  . propranolol (INDERAL) 10 MG tablet TAKE 1 TABLET BY MOUTH 3 TIMES DAILY AS NEEDED (TREMORS).  Marland Kitchen rosuvastatin (CRESTOR) 20 MG tablet Take 0.5 tablets (10 mg total) by mouth daily.  . sertraline (ZOLOFT) 25 MG tablet TAKE 1 TABLET (25 MG TOTAL) BY MOUTH DAILY.  . SYNTHROID 112 MCG tablet TAKE 1 TABLET (112 MCG TOTAL) BY MOUTH DAILY BEFORE BREAKFAST.  Marland Kitchen trimethoprim (TRIMPEX) 100 MG tablet Take 100 mg by mouth daily.  . [DISCONTINUED] clonazePAM (KLONOPIN) 0.5 MG tablet TAKE 1-2 TABLETS BY MOUTH DAILY AS NEEDED   No facility-administered encounter medications on file as of 01/04/2018.   :  Review of Systems:  Out of a complete 14 point review of systems, all are reviewed and negative with the exception of these symptoms as listed below:  Review of Systems  Neurological:       Pt presents today for follow up.    Objective:  Neurological Exam  Physical Exam Physical Examination:   Vitals:   01/04/18  1307  BP: 140/71  Pulse: 72   General Examination: The patient is a very pleasant 77 y.o. female in no acute distress. She appears well-developed and well-nourished and well groomed.   HEENT:Normocephalic, atraumatic, pupils are equal, round and reactive to light and accommodation. Extraocular tracking shows limitation to all directions, particularly upper gaze, she has saccadic breakdown, smooth pursuit is impaired, hearing mildly impaired, moderate facial masking is noted, speech is moderately hypophonic, mild scanning noted, no sialorrhea, neck is moderately rigid, she has a mild head tremor, head is more tilted to the left and she is more leaning to the left in her wheelchair. This may have become a little worse since last time.   Chest:Clear to auscultation without wheezing, rhonchi or crackles noted.  Heart:S1+S2+0, regular and normal without murmurs, rubs or gallops noted.   Abdomen:Soft, non-tender and non-distended with normal bowel sounds appreciated on auscultation.  Extremities:There is 1+ pitting edema in the distal lower extremities bilaterally, some bruising noted, some non-pitting puffiness in both hands, edema is worse in the left lower extremity.    Skin: Warm and dry without trophic changes noted. She has some  smaller bruises on her hands.   Musculoskeletal: exam reveals no obvious joint deformities, tenderness or joint swelling or erythema.   Neurologically:  Mental status: The patient is awake, alert and oriented in all 4 spheres. Her memory, attention, language and knowledge are fairly well preserved. There is no aphasia, agnosia, apraxia or anomia. Thought process is linear. Mood is congruent and affect is normal.  Cranial nerves are as described above under HEENT exam. Motor exam: she has thin bulk, 4/5 global strength and tone is increased throughout, moderately, R>L. There is no drift,  intermittent mild resting tremor in both upper extremities noted,  grip strength is weaker on the right, she has decrease in range of motion in her right arm and hand. Romberg is not testable safely and she cannot stand or walk for me. Reflexes are brisker in the lower extremities, about 2+ in the upper extremities, fine motor skills are severely impaired except slightly better in the left upper extremity. Sensory exam is intact to light touch.   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 She has progressed with time, as is expected for this rare, neurodegenerative disease. She keeps going and holding up her spirits, she is a trooper! Her husband has been supportive and patient.  She had reported generalizedmuscle spasms and feeling of chills in the past. She had tried low-dose baclofen but did not improve, had actually stopped taking it. She had a fall in April 2018. I will reinitiate home health physical therapy. She has become more deconditioned and frail. Her husband has had his own medical issues in the past year or 2. Thankfully, they have help at the house with caretakers from 9 AM to about 10 PM and their son lives with them and can assist at night if needed. She had a brain MRI in June 2016 with stable findings compared to September 2013, which was reassuring. 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 She has had more neck pain and neck stiffness. Her CT neck without contrast in April 2018 confirmed multilevel arthritic changes in her neck. She has fallen repeatedly in the past and also recently and continues to be at high  fall risk. She has been seen at [facility] for second opinion and also [facility], first in our around August 2013. She is at this point confined to her wheelchair. She sleeps in her lift chair. She has seen a GI specialist and a urologist for her bladder spasms and recurrent urinary tract infections.  we talked about the importance of being proactive about constipation issues. She has not had any recent issues swallowing thankfully. I made a referral to home health physical therapy and will see her back routinely in 6 months. I answered all their questions today and the patient and her husband were in agreement. I spent 20 minutes in total face-to-face time with the patient, more than 50% of which was spent in counseling and coordination of care, reviewing test results, reviewing medication and discussing or reviewing the diagnosis of MSA, its prognosis and treatment options. Pertinent laboratory and imaging test results that were available during this visit  with the patient were reviewed by me and considered in my medical decision making (see chart for details).

## 2018-01-04 NOTE — Patient Instructions (Addendum)
I will do another referral to home health for physical therapy.  [facility].  Please be more proactive with your constipation regimen, titrating your stool softener and/or laxative as needed to where you have a formed stool at least every other day. Stay well hydrated and eat yoghurt if you can.

## 2018-01-13 DIAGNOSIS — D519 Vitamin B12 deficiency anemia, unspecified: Secondary | ICD-10-CM | POA: Diagnosis not present

## 2018-01-13 DIAGNOSIS — R262 Difficulty in walking, not elsewhere classified: Secondary | ICD-10-CM | POA: Diagnosis not present

## 2018-01-13 DIAGNOSIS — M6281 Muscle weakness (generalized): Secondary | ICD-10-CM | POA: Diagnosis not present

## 2018-01-13 DIAGNOSIS — I2510 Atherosclerotic heart disease of native coronary artery without angina pectoris: Secondary | ICD-10-CM | POA: Diagnosis not present

## 2018-01-13 DIAGNOSIS — G903 Multi-system degeneration of the autonomic nervous system: Secondary | ICD-10-CM | POA: Diagnosis not present

## 2018-01-13 DIAGNOSIS — I10 Essential (primary) hypertension: Secondary | ICD-10-CM | POA: Diagnosis not present

## 2018-01-13 DIAGNOSIS — R251 Tremor, unspecified: Secondary | ICD-10-CM | POA: Diagnosis not present

## 2018-01-15 ENCOUNTER — Other Ambulatory Visit: Payer: Self-pay | Admitting: Cardiovascular Disease

## 2018-01-15 ENCOUNTER — Other Ambulatory Visit: Payer: Self-pay | Admitting: Family Medicine

## 2018-01-17 ENCOUNTER — Ambulatory Visit: Payer: Medicare Other | Admitting: Cardiovascular Disease

## 2018-01-17 ENCOUNTER — Encounter: Payer: Self-pay | Admitting: Cardiovascular Disease

## 2018-01-17 VITALS — BP 140/70 | HR 70

## 2018-01-17 DIAGNOSIS — I2510 Atherosclerotic heart disease of native coronary artery without angina pectoris: Secondary | ICD-10-CM | POA: Diagnosis not present

## 2018-01-17 DIAGNOSIS — I10 Essential (primary) hypertension: Secondary | ICD-10-CM | POA: Diagnosis not present

## 2018-01-17 NOTE — Progress Notes (Signed)
Cardiology Office Note Date:  01/17/2018   ID:  [name] [name], DOB 06-04-1941, MRN [id number]  PCP:  Ria Bush, MD  Cardiologist:  [name], MD    Chief Complaint  Patient presents with  . Follow-up    CAD    History of Present Illness: [name] [name] is a 77 y.o. female who presents for follow-up of nonobstructive coronary artery disease managed medically.  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  The patient is here with her husband today.  She reports no change in any cardiac symptoms.  She specifically denies chest pain, chest pressure, or shortness of breath.  Her  neuromuscular symptoms continue to progress.  Chronic leg swelling is unchanged.  Past Medical History:  Diagnosis Date  . Asthma   . CAD (coronary artery disease)    myoview 5/08:  EF 76%, no scar, no ischemia, +ECG changes with exercise;  cath 04/08/07:  pLAD 30%, mLAD 60-70% - med tx.  . Chronic cystitis    recurrent UTIs, started bactrim ppx (McDiarmid)  . Depression   . Familial tremor    followed by Dr. Erling Cruz  . GERD (gastroesophageal reflux disease)    s/p nissen  . History of pneumonia   . HLD (hyperlipidemia)   . Hydronephrosis, bilateral   . Hypothyroidism   . Internal hemorrhoids   . 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 Neurogenic bladder    Tannenbaum/MacDiarmid  . Osteoporosis 2016   DEXA T-4.4 spine deteriorated since 2012  . Unspecified chronic bronchitis ([place])   . VAGINITIS, ATROPHIC 11/07/2007    Past Surgical History:  Procedure Laterality Date  . COLONOSCOPY N/A 08/29/2013   int hemm; Inda Castle, MD  . CYSTECTOMY    . DEXA  12/2014   T -4.4 spine  . ESOPHAGOGASTRODUODENOSCOPY N/A 01/09/2015   Procedure: ESOPHAGOGASTRODUODENOSCOPY (EGD);  Surgeon: Inda Castle, MD;  Location: [facility];  Service: Endoscopy;  Laterality: N/A;  help with transfers  . LAPAROSCOPIC NISSEN FUNDOPLICATION      . TUBAL LIGATION    . TUMOR REMOVAL      Current Outpatient Medications  Medication Sig Dispense Refill  . albuterol (PROVENTIL HFA;VENTOLIN HFA) 108 (90 Base) MCG/ACT inhaler TAKE 2 PUFFS BY MOUTH EVERY 6 HOURS AS NEEDED FOR WHEEZE OR SHORTNESS OF BREATH 8.5 Inhaler 0  . aspirin EC 81 MG tablet Take 81 mg by mouth every other day.    . clonazePAM (KLONOPIN) 1 MG tablet TAKE 0.5-1 TABLETS (0.5-1 MG TOTAL) BY MOUTH DAILY AS NEEDED FOR ANXIETY. 30 tablet 3  . DEXILANT 60 MG capsule TAKE 1 CAPSULE (60 MG TOTAL) BY MOUTH DAILY. 30 capsule 2  . docusate sodium (COLACE) 100 MG capsule Take 200 mg by mouth daily.    . famotidine (PEPCID) 40 MG tablet TAKE 1 TABLET (40 MG TOTAL) BY MOUTH 2 (TWO) TIMES DAILY AS NEEDED FOR HEARTBURN. 180 tablet 3  . furosemide (LASIX) 20 MG tablet Take 1 tablet (20 mg total) by mouth daily as needed for fluid or edema. 30 tablet 0  . hyoscyamine (LEVSIN SL) 0.125 MG SL tablet PLACE 2 TABLETS (0.25 MG TOTAL) UNDER THE TONGUE EVERY 4 (FOUR) HOURS AS NEEDED (SPASMS). 30 tablet 5  . hyoscyamine (LEVSIN SL) 0.125 MG SL tablet PLACE 2 TABLETS (0.25 MG TOTAL) UNDER THE TONGUE EVERY 4 (FOUR) HOURS AS NEEDED (SPASMS). 90 tablet 0  . isosorbide mononitrate (IMDUR) 30  MG 24 hr tablet TAKE 1/2 TABLET BY MOUTH DAILY 15 tablet 11  . Melatonin 3 MG TABS Take 1 tablet (3 mg total) by mouth at bedtime.    Marland Kitchen MYRBETRIQ 50 MG TB24 tablet Take 50 mg by mouth daily.  11  . nitroGLYCERIN (NITROSTAT) 0.4 MG SL tablet Place 1 tablet (0.4 mg total) under the tongue every 5 (five) minutes as needed (chest pain). 25 tablet 3  . propranolol (INDERAL) 10 MG tablet TAKE 1 TABLET BY MOUTH 3 TIMES DAILY AS NEEDED (TREMORS). 180 tablet 0  . rosuvastatin (CRESTOR) 20 MG tablet TAKE 0.5 TABLETS (10 MG TOTAL) BY MOUTH DAILY. 45 tablet 1  . sertraline (ZOLOFT) 25 MG tablet TAKE 1 TABLET (25 MG TOTAL) BY MOUTH DAILY. 90 tablet 3  . SYNTHROID 112 MCG tablet TAKE 1 TABLET (112 MCG TOTAL) BY MOUTH DAILY BEFORE  BREAKFAST. 30 tablet 6  . trimethoprim (TRIMPEX) 100 MG tablet Take 100 mg by mouth daily.  11   No current facility-administered medications for this visit.     Allergies:   Keflex [cephalexin]   Social History:  The patient  reports that  has never smoked. she has never used smokeless tobacco. She reports that she does not drink alcohol or use drugs.   Family History:  The patient's family history includes Breast cancer in her maternal aunt; Colon cancer in her maternal aunt; Coronary artery disease in her daughter and father; Diabetes in her daughter; Heart attack in her daughter and father; Heart disease in her father; Hypertension in her mother; Multiple sclerosis in her daughter; Stroke in her daughter.   ROS:  Please see the history of present illness.  Otherwise, review of systems is positive for weakness, neck pain.  All other systems are reviewed and negative.   PHYSICAL EXAM: VS:  BP 140/70   Pulse 70  , BMI There is no height or weight on file to calculate BMI. GEN: Pleasant woman, in wheelchair, in no acute distress  HEENT: normal  Neck: no JVD, no masses. No carotid bruits Cardiac: RRR without murmur or gallop                Respiratory:  clear to auscultation bilaterally, normal work of breathing GI: soft, nontender, nondistended, + BS MS: no deformity or atrophy  Ext: 1+ bilateral ankle edema Skin: warm and dry, no rash Neuro:  Strength and sensation are intact Psych: euthymic mood, full affect  EKG:  EKG is ordered today. The ekg ordered today shows normal sinus rhythm, heart rate 70 bpm, within normal limits.  Recent Labs: 05/18/2017: ALT 14; BUN 15; Creatinine, Ser 0.88; Hemoglobin 13.6; Platelets 283.0; Potassium 4.7; Sodium 139   Lipid Panel     Component Value Date/Time   CHOL 122 07/28/2016 1430   TRIG 96.0 07/28/2016 1430   HDL 46.50 07/28/2016 1430   CHOLHDL 3 07/28/2016 1430   VLDL 19.2 07/28/2016 1430   LDLCALC 56 07/28/2016 1430   LDLDIRECT 161.5  06/09/2010 1517      Wt Readings from Last 3 Encounters:  06/14/17 145 lb (65.8 kg)  04/14/17 145 lb (65.8 kg)  03/07/17 145 lb (65.8 kg)     ASSESSMENT AND PLAN: 1.  Coronary artery disease, native vessel, without angina: Patient is treated with aspirin, isosorbide, metoprolol, and a statin drug.  No changes are recommended today.  2.  Hypertension: Blood pressure is controlled on current medicines.  3.  Hyperlipidemia: Treated with Crestor.  Current medicines are reviewed  with the patient today.  The patient does not have concerns regarding medicines.  Labs/ tests ordered today include:   Orders Placed This Encounter  Procedures  . EKG 12-Lead    Disposition:   FU 6 months  Signed, [name], MD  01/17/2018 2:51 PM    [facility] [place], [place], [place]  [address] Phone: [phone]; Fax: [phone]

## 2018-01-17 NOTE — Telephone Encounter (Signed)
Eprescribed.

## 2018-01-17 NOTE — Patient Instructions (Signed)
Medication Instructions:  Your provider recommends that you continue on your current medications as directed. Please refer to the Current Medication list given to you today.    Labwork: None  Testing/Procedures: None  Follow-Up: Your provider wants you to follow-up in: 6 months with Dr. Burt Knack. You will receive a reminder letter in the mail two months in advance. If you don't receive a letter, please call our office to schedule the follow-up appointment.    Any Other Special Instructions Will Be Listed Below (If Applicable).     If you need a refill on your cardiac medications before your next appointment, please call your pharmacy.

## 2018-01-18 DIAGNOSIS — M6281 Muscle weakness (generalized): Secondary | ICD-10-CM | POA: Diagnosis not present

## 2018-01-18 DIAGNOSIS — R262 Difficulty in walking, not elsewhere classified: Secondary | ICD-10-CM | POA: Diagnosis not present

## 2018-01-18 DIAGNOSIS — R251 Tremor, unspecified: Secondary | ICD-10-CM | POA: Diagnosis not present

## 2018-01-18 DIAGNOSIS — D519 Vitamin B12 deficiency anemia, unspecified: Secondary | ICD-10-CM | POA: Diagnosis not present

## 2018-01-18 DIAGNOSIS — G903 Multi-system degeneration of the autonomic nervous system: Secondary | ICD-10-CM | POA: Diagnosis not present

## 2018-01-18 DIAGNOSIS — I10 Essential (primary) hypertension: Secondary | ICD-10-CM | POA: Diagnosis not present

## 2018-01-18 DIAGNOSIS — I2510 Atherosclerotic heart disease of native coronary artery without angina pectoris: Secondary | ICD-10-CM | POA: Diagnosis not present

## 2018-01-20 DIAGNOSIS — G903 Multi-system degeneration of the autonomic nervous system: Secondary | ICD-10-CM | POA: Diagnosis not present

## 2018-01-20 DIAGNOSIS — R251 Tremor, unspecified: Secondary | ICD-10-CM | POA: Diagnosis not present

## 2018-01-20 DIAGNOSIS — I2510 Atherosclerotic heart disease of native coronary artery without angina pectoris: Secondary | ICD-10-CM | POA: Diagnosis not present

## 2018-01-20 DIAGNOSIS — R262 Difficulty in walking, not elsewhere classified: Secondary | ICD-10-CM | POA: Diagnosis not present

## 2018-01-20 DIAGNOSIS — D519 Vitamin B12 deficiency anemia, unspecified: Secondary | ICD-10-CM | POA: Diagnosis not present

## 2018-01-20 DIAGNOSIS — I10 Essential (primary) hypertension: Secondary | ICD-10-CM | POA: Diagnosis not present

## 2018-01-20 DIAGNOSIS — M6281 Muscle weakness (generalized): Secondary | ICD-10-CM | POA: Diagnosis not present

## 2018-01-25 ENCOUNTER — Encounter: Payer: Self-pay | Admitting: Family Medicine

## 2018-01-25 ENCOUNTER — Ambulatory Visit (INDEPENDENT_AMBULATORY_CARE_PROVIDER_SITE_OTHER): Payer: Medicare Other | Admitting: Family Medicine

## 2018-01-25 VITALS — BP 122/60 | HR 73 | Temp 97.6°F

## 2018-01-25 DIAGNOSIS — R296 Repeated falls: Secondary | ICD-10-CM

## 2018-01-25 DIAGNOSIS — N319 Neuromuscular dysfunction of bladder, unspecified: Secondary | ICD-10-CM | POA: Diagnosis not present

## 2018-01-25 DIAGNOSIS — I2510 Atherosclerotic heart disease of native coronary artery without angina pectoris: Secondary | ICD-10-CM

## 2018-01-25 DIAGNOSIS — G903 Multi-system degeneration of the autonomic nervous system: Secondary | ICD-10-CM

## 2018-01-25 DIAGNOSIS — G239 Degenerative disease of basal ganglia, unspecified: Secondary | ICD-10-CM | POA: Diagnosis not present

## 2018-01-25 DIAGNOSIS — E538 Deficiency of other specified B group vitamins: Secondary | ICD-10-CM | POA: Diagnosis not present

## 2018-01-25 DIAGNOSIS — G20 Parkinson's disease: Secondary | ICD-10-CM | POA: Diagnosis not present

## 2018-01-25 DIAGNOSIS — E559 Vitamin D deficiency, unspecified: Secondary | ICD-10-CM | POA: Diagnosis not present

## 2018-01-25 DIAGNOSIS — R531 Weakness: Secondary | ICD-10-CM

## 2018-01-25 DIAGNOSIS — N3020 Other chronic cystitis without hematuria: Secondary | ICD-10-CM

## 2018-01-25 MED ORDER — ASPIRIN EC 81 MG PO TBEC: 81.0000 mg | DELAYED_RELEASE_TABLET | Freq: Every day | ORAL | Status: AC

## 2018-01-25 MED ORDER — CYANOCOBALAMIN 1000 MCG/ML IJ SOLN
1000.0000 ug | Freq: Once | INTRAMUSCULAR | Status: AC
  Administered 2018-01-25: 1000 ug via INTRAMUSCULAR

## 2018-01-25 NOTE — Assessment & Plan Note (Addendum)
Has seen urology. Continue myrbetriq.

## 2018-01-25 NOTE — Assessment & Plan Note (Signed)
b12 shot today.

## 2018-01-25 NOTE — Assessment & Plan Note (Signed)
Continues daily trimethoprim ppx - consider updated UA next visit. Denies any UTI sxs.

## 2018-01-25 NOTE — Assessment & Plan Note (Signed)
Latest levels too high - now off 50k weekly supplementation. Will recheck next labs.

## 2018-01-25 NOTE — Assessment & Plan Note (Signed)
HHPT now involved.

## 2018-01-25 NOTE — Assessment & Plan Note (Signed)
Appreciate cards care. Continue aspirin, statin.

## 2018-01-25 NOTE — Patient Instructions (Addendum)
b12 shot today Good to see you today! Return in 3-4 months for medicare wellness visit with Katha Cabal and then physical with me. We will check labwork at that time as well.

## 2018-01-25 NOTE — Assessment & Plan Note (Signed)
Appreciate neuro care. Now has [facility].  Stable period. Support provided.

## 2018-01-25 NOTE — Progress Notes (Signed)
BP 122/60 (BP Location: Right Arm, Patient Position: Sitting, Cuff Size: Normal)   Pulse 73   Temp 97.6 F (36.4 C) (Oral)   SpO2 98%    CC: 3 mo f/u visit, b12 shot Subjective:    Patient ID: [name] [name], female    DOB: 1941/03/20, 77 y.o.   MRN: [id number]  HPI: [name] [name] is a 77 y.o. female presenting on 01/25/2018 for 3 mo follow-up and Injections (Needs vit B12)   Progressive SMA recently saw neurology and cards - notes reviewed. Has personal caregivers at home 9am-10pm. Neurology (Dr Rexene Alberts) recently referred her back to home health physical [facility] through [facility]. Confined to wheelchair, sleeps in lift chair. Dr Burt Knack cardiology gave her a good report last week.   Progressive L sided leaning noted. Noting increased fatigue, and worsening appetite over the last week. Dulcolax stool softener daily helps control constipation. Voiding well.   Requests b12 shot today Not taking vitamin D  Relevant past medical, surgical, family and social history reviewed and updated as indicated. Interim medical history since our last visit reviewed. Allergies and medications reviewed and updated. Outpatient Medications Prior to Visit  Medication Sig Dispense Refill  . albuterol (PROVENTIL HFA;VENTOLIN HFA) 108 (90 Base) MCG/ACT inhaler TAKE 2 PUFFS BY MOUTH EVERY 6 HOURS AS NEEDED FOR WHEEZE OR SHORTNESS OF BREATH 8.5 Inhaler 0  . clonazePAM (KLONOPIN) 1 MG tablet TAKE 0.5-1 TABLETS (0.5-1 MG TOTAL) BY MOUTH DAILY AS NEEDED FOR ANXIETY. 30 tablet 3  . DEXILANT 60 MG capsule TAKE 1 CAPSULE (60 MG TOTAL) BY MOUTH DAILY. 30 capsule 2  . docusate sodium (COLACE) 100 MG capsule Take 200 mg by mouth daily.    . famotidine (PEPCID) 40 MG tablet TAKE 1 TABLET (40 MG TOTAL) BY MOUTH 2 (TWO) TIMES DAILY AS NEEDED FOR HEARTBURN. 180 tablet 3  . furosemide (LASIX) 20 MG tablet Take 1 tablet (20 mg total) by mouth daily as needed for fluid or edema. 30 tablet 0  . hyoscyamine (LEVSIN SL)  0.125 MG SL tablet PLACE 2 TABLETS (0.25 MG TOTAL) UNDER THE TONGUE EVERY 4 (FOUR) HOURS AS NEEDED (SPASMS). 90 tablet 0  . isosorbide mononitrate (IMDUR) 30 MG 24 hr tablet TAKE 1/2 TABLET BY MOUTH DAILY 15 tablet 11  . Melatonin 3 MG TABS Take 1 tablet (3 mg total) by mouth at bedtime.    Marland Kitchen MYRBETRIQ 50 MG TB24 tablet Take 50 mg by mouth daily.  11  . nitroGLYCERIN (NITROSTAT) 0.4 MG SL tablet Place 1 tablet (0.4 mg total) under the tongue every 5 (five) minutes as needed (chest pain). 25 tablet 3  . propranolol (INDERAL) 10 MG tablet TAKE 1 TABLET BY MOUTH 3 TIMES DAILY AS NEEDED (TREMORS). 180 tablet 0  . rosuvastatin (CRESTOR) 20 MG tablet TAKE 0.5 TABLETS (10 MG TOTAL) BY MOUTH DAILY. 45 tablet 1  . sertraline (ZOLOFT) 25 MG tablet TAKE 1 TABLET (25 MG TOTAL) BY MOUTH DAILY. 90 tablet 3  . SYNTHROID 112 MCG tablet TAKE 1 TABLET (112 MCG TOTAL) BY MOUTH DAILY BEFORE BREAKFAST. 30 tablet 6  . trimethoprim (TRIMPEX) 100 MG tablet Take 100 mg by mouth daily.  11  . aspirin EC 81 MG tablet Take 81 mg by mouth every other day.    . hyoscyamine (LEVSIN SL) 0.125 MG SL tablet PLACE 2 TABLETS (0.25 MG TOTAL) UNDER THE TONGUE EVERY 4 (FOUR) HOURS AS NEEDED (SPASMS). 30 tablet 5   No facility-administered medications prior to visit.  Per HPI unless specifically indicated in ROS section below Review of Systems     Objective:    BP 122/60 (BP Location: Right Arm, Patient Position: Sitting, Cuff Size: Normal)   Pulse 73   Temp 97.6 F (36.4 C) (Oral)   SpO2 98%   Wt Readings from Last 3 Encounters:  06/14/17 145 lb (65.8 kg)  04/14/17 145 lb (65.8 kg)  03/07/17 145 lb (65.8 kg)    Physical Exam  Constitutional: She appears well-developed and well-nourished. No distress.  Seated in wheelchair, leaning to the left  HENT:  Mouth/Throat: Oropharynx is clear and moist. No oropharyngeal exudate.  Eyes: Conjunctivae are normal. Pupils are equal, round, and reactive to light.    Cardiovascular: Normal rate, regular rhythm, normal heart sounds and intact distal pulses.  No murmur heard. Pulmonary/Chest: Effort normal and breath sounds normal. No respiratory distress. She has no wheezes. She has no rales.  Abdominal: Soft. Bowel sounds are normal. She exhibits no distension and no mass. There is no tenderness. There is no rebound and no guarding.  Musculoskeletal: She exhibits edema (tr).  Skin: Skin is warm and dry. No rash noted.  Psychiatric: She has a normal mood and affect.  Nursing note and vitals reviewed.  Results for orders placed or performed in visit on 05/18/17  Comprehensive metabolic panel  Result Value Ref Range   Sodium 139 135 - 145 mEq/L   Potassium 4.7 3.5 - 5.1 mEq/L   Chloride 103 96 - 112 mEq/L   CO2 28 19 - 32 mEq/L   Glucose, Bld 110 (H) 70 - 99 mg/dL   BUN 15 6 - 23 mg/dL   Creatinine, Ser 0.88 0.40 - 1.20 mg/dL   Total Bilirubin 0.4 0.2 - 1.2 mg/dL   Alkaline Phosphatase 110 39 - 117 U/L   AST 16 0 - 37 U/L   ALT 14 0 - 35 U/L   Total Protein 6.7 6.0 - 8.3 g/dL   Albumin 4.1 3.5 - 5.2 g/dL   Calcium 10.1 8.4 - 10.5 mg/dL   GFR 66.22 >60.00 mL/min  Sedimentation rate  Result Value Ref Range   Sed Rate 7 0 - 30 mm/hr  VITAMIN D 25 Hydroxy (Vit-D Deficiency, Fractures)  Result Value Ref Range   VITD 111.87 (HH) 30.00 - 100.00 ng/mL  CBC with Differential/Platelet  Result Value Ref Range   WBC 5.9 4.0 - 10.5 K/uL   RBC 4.63 3.87 - 5.11 Mil/uL   Hemoglobin 13.6 12.0 - 15.0 g/dL   HCT 40.2 36.0 - 46.0 %   MCV 86.8 78.0 - 100.0 fl   MCHC 33.9 30.0 - 36.0 g/dL   RDW 13.8 11.5 - 15.5 %   Platelets 283.0 150.0 - 400.0 K/uL   Neutrophils Relative % 57.7 43.0 - 77.0 %   Lymphocytes Relative 32.0 12.0 - 46.0 %   Monocytes Relative 8.6 3.0 - 12.0 %   Eosinophils Relative 1.2 0.0 - 5.0 %   Basophils Relative 0.5 0.0 - 3.0 %   Neutro Abs 3.4 1.4 - 7.7 K/uL   Lymphs Abs 1.9 0.7 - 4.0 K/uL   Monocytes Absolute 0.5 0.1 - 1.0 K/uL    Eosinophils Absolute 0.1 0.0 - 0.7 K/uL   Basophils Absolute 0.0 0.0 - 0.1 K/uL      Assessment & Plan:   Problem List Items Addressed This Visit    Atypical Parkinsonism (HCC)   CAD (coronary artery disease)    Appreciate cards care. Continue aspirin, statin.  Relevant Medications   aspirin EC 81 MG tablet   Chronic cystitis    Continues daily trimethoprim ppx - consider updated UA next visit. Denies any UTI sxs.       General weakness   Multiple system atrophy ([place]) - Primary    Appreciate neuro care. Now has [facility].  Stable period. Support provided.       Neurogenic bladder    Has seen urology. Continue myrbetriq.       Recurrent falls    HHPT now involved.      Vitamin B12 deficiency    b12 shot today.       Relevant Medications   cyanocobalamin ((VITAMIN B-12)) injection 1,000 mcg (Completed) (Start on 01/25/2018  3:15 PM)   Vitamin D deficiency    Latest levels too high - now off 50k weekly supplementation. Will recheck next labs.           Meds ordered this encounter  Medications  . aspirin EC 81 MG tablet    Sig: Take 1 tablet (81 mg total) by mouth daily.  . cyanocobalamin ((VITAMIN B-12)) injection 1,000 mcg   No orders of the defined types were placed in this encounter.   Follow up plan: Return in about 3 months (around 04/27/2018), or if symptoms worsen or fail to improve, for annual exam, prior fasting for blood work, medicare wellness visit.  Ria Bush, MD

## 2018-01-27 DIAGNOSIS — D519 Vitamin B12 deficiency anemia, unspecified: Secondary | ICD-10-CM | POA: Diagnosis not present

## 2018-01-27 DIAGNOSIS — R262 Difficulty in walking, not elsewhere classified: Secondary | ICD-10-CM | POA: Diagnosis not present

## 2018-01-27 DIAGNOSIS — I10 Essential (primary) hypertension: Secondary | ICD-10-CM | POA: Diagnosis not present

## 2018-01-27 DIAGNOSIS — I2510 Atherosclerotic heart disease of native coronary artery without angina pectoris: Secondary | ICD-10-CM | POA: Diagnosis not present

## 2018-01-27 DIAGNOSIS — R251 Tremor, unspecified: Secondary | ICD-10-CM | POA: Diagnosis not present

## 2018-01-27 DIAGNOSIS — G903 Multi-system degeneration of the autonomic nervous system: Secondary | ICD-10-CM | POA: Diagnosis not present

## 2018-01-27 DIAGNOSIS — M6281 Muscle weakness (generalized): Secondary | ICD-10-CM | POA: Diagnosis not present

## 2018-02-01 DIAGNOSIS — R251 Tremor, unspecified: Secondary | ICD-10-CM | POA: Diagnosis not present

## 2018-02-01 DIAGNOSIS — M6281 Muscle weakness (generalized): Secondary | ICD-10-CM | POA: Diagnosis not present

## 2018-02-01 DIAGNOSIS — I2510 Atherosclerotic heart disease of native coronary artery without angina pectoris: Secondary | ICD-10-CM | POA: Diagnosis not present

## 2018-02-01 DIAGNOSIS — I10 Essential (primary) hypertension: Secondary | ICD-10-CM | POA: Diagnosis not present

## 2018-02-01 DIAGNOSIS — G903 Multi-system degeneration of the autonomic nervous system: Secondary | ICD-10-CM | POA: Diagnosis not present

## 2018-02-01 DIAGNOSIS — R262 Difficulty in walking, not elsewhere classified: Secondary | ICD-10-CM | POA: Diagnosis not present

## 2018-02-01 DIAGNOSIS — D519 Vitamin B12 deficiency anemia, unspecified: Secondary | ICD-10-CM | POA: Diagnosis not present

## 2018-02-03 DIAGNOSIS — R262 Difficulty in walking, not elsewhere classified: Secondary | ICD-10-CM | POA: Diagnosis not present

## 2018-02-03 DIAGNOSIS — I10 Essential (primary) hypertension: Secondary | ICD-10-CM | POA: Diagnosis not present

## 2018-02-03 DIAGNOSIS — D519 Vitamin B12 deficiency anemia, unspecified: Secondary | ICD-10-CM | POA: Diagnosis not present

## 2018-02-03 DIAGNOSIS — G903 Multi-system degeneration of the autonomic nervous system: Secondary | ICD-10-CM | POA: Diagnosis not present

## 2018-02-03 DIAGNOSIS — M6281 Muscle weakness (generalized): Secondary | ICD-10-CM | POA: Diagnosis not present

## 2018-02-03 DIAGNOSIS — R251 Tremor, unspecified: Secondary | ICD-10-CM | POA: Diagnosis not present

## 2018-02-03 DIAGNOSIS — I2510 Atherosclerotic heart disease of native coronary artery without angina pectoris: Secondary | ICD-10-CM | POA: Diagnosis not present

## 2018-02-10 DIAGNOSIS — I10 Essential (primary) hypertension: Secondary | ICD-10-CM | POA: Diagnosis not present

## 2018-02-10 DIAGNOSIS — I2510 Atherosclerotic heart disease of native coronary artery without angina pectoris: Secondary | ICD-10-CM | POA: Diagnosis not present

## 2018-02-10 DIAGNOSIS — G903 Multi-system degeneration of the autonomic nervous system: Secondary | ICD-10-CM | POA: Diagnosis not present

## 2018-02-10 DIAGNOSIS — D519 Vitamin B12 deficiency anemia, unspecified: Secondary | ICD-10-CM | POA: Diagnosis not present

## 2018-02-10 DIAGNOSIS — R251 Tremor, unspecified: Secondary | ICD-10-CM | POA: Diagnosis not present

## 2018-02-10 DIAGNOSIS — R262 Difficulty in walking, not elsewhere classified: Secondary | ICD-10-CM | POA: Diagnosis not present

## 2018-02-10 DIAGNOSIS — M6281 Muscle weakness (generalized): Secondary | ICD-10-CM | POA: Diagnosis not present

## 2018-02-15 DIAGNOSIS — R251 Tremor, unspecified: Secondary | ICD-10-CM | POA: Diagnosis not present

## 2018-02-15 DIAGNOSIS — D519 Vitamin B12 deficiency anemia, unspecified: Secondary | ICD-10-CM | POA: Diagnosis not present

## 2018-02-15 DIAGNOSIS — I2510 Atherosclerotic heart disease of native coronary artery without angina pectoris: Secondary | ICD-10-CM | POA: Diagnosis not present

## 2018-02-15 DIAGNOSIS — G903 Multi-system degeneration of the autonomic nervous system: Secondary | ICD-10-CM | POA: Diagnosis not present

## 2018-02-15 DIAGNOSIS — M6281 Muscle weakness (generalized): Secondary | ICD-10-CM | POA: Diagnosis not present

## 2018-02-15 DIAGNOSIS — I10 Essential (primary) hypertension: Secondary | ICD-10-CM | POA: Diagnosis not present

## 2018-02-15 DIAGNOSIS — R262 Difficulty in walking, not elsewhere classified: Secondary | ICD-10-CM | POA: Diagnosis not present

## 2018-02-17 DIAGNOSIS — M6281 Muscle weakness (generalized): Secondary | ICD-10-CM | POA: Diagnosis not present

## 2018-02-17 DIAGNOSIS — I2510 Atherosclerotic heart disease of native coronary artery without angina pectoris: Secondary | ICD-10-CM | POA: Diagnosis not present

## 2018-02-17 DIAGNOSIS — I10 Essential (primary) hypertension: Secondary | ICD-10-CM | POA: Diagnosis not present

## 2018-02-17 DIAGNOSIS — D519 Vitamin B12 deficiency anemia, unspecified: Secondary | ICD-10-CM | POA: Diagnosis not present

## 2018-02-17 DIAGNOSIS — G903 Multi-system degeneration of the autonomic nervous system: Secondary | ICD-10-CM | POA: Diagnosis not present

## 2018-02-17 DIAGNOSIS — R262 Difficulty in walking, not elsewhere classified: Secondary | ICD-10-CM | POA: Diagnosis not present

## 2018-02-17 DIAGNOSIS — R251 Tremor, unspecified: Secondary | ICD-10-CM | POA: Diagnosis not present

## 2018-02-22 DIAGNOSIS — G903 Multi-system degeneration of the autonomic nervous system: Secondary | ICD-10-CM | POA: Diagnosis not present

## 2018-02-22 DIAGNOSIS — R262 Difficulty in walking, not elsewhere classified: Secondary | ICD-10-CM | POA: Diagnosis not present

## 2018-02-22 DIAGNOSIS — M6281 Muscle weakness (generalized): Secondary | ICD-10-CM | POA: Diagnosis not present

## 2018-02-22 DIAGNOSIS — I10 Essential (primary) hypertension: Secondary | ICD-10-CM | POA: Diagnosis not present

## 2018-02-22 DIAGNOSIS — R251 Tremor, unspecified: Secondary | ICD-10-CM | POA: Diagnosis not present

## 2018-02-22 DIAGNOSIS — D519 Vitamin B12 deficiency anemia, unspecified: Secondary | ICD-10-CM | POA: Diagnosis not present

## 2018-02-22 DIAGNOSIS — I2510 Atherosclerotic heart disease of native coronary artery without angina pectoris: Secondary | ICD-10-CM | POA: Diagnosis not present

## 2018-02-24 DIAGNOSIS — R251 Tremor, unspecified: Secondary | ICD-10-CM | POA: Diagnosis not present

## 2018-02-24 DIAGNOSIS — D519 Vitamin B12 deficiency anemia, unspecified: Secondary | ICD-10-CM | POA: Diagnosis not present

## 2018-02-24 DIAGNOSIS — R262 Difficulty in walking, not elsewhere classified: Secondary | ICD-10-CM | POA: Diagnosis not present

## 2018-02-24 DIAGNOSIS — I10 Essential (primary) hypertension: Secondary | ICD-10-CM | POA: Diagnosis not present

## 2018-02-24 DIAGNOSIS — G903 Multi-system degeneration of the autonomic nervous system: Secondary | ICD-10-CM | POA: Diagnosis not present

## 2018-02-24 DIAGNOSIS — I2510 Atherosclerotic heart disease of native coronary artery without angina pectoris: Secondary | ICD-10-CM | POA: Diagnosis not present

## 2018-02-24 DIAGNOSIS — M6281 Muscle weakness (generalized): Secondary | ICD-10-CM | POA: Diagnosis not present

## 2018-03-01 ENCOUNTER — Ambulatory Visit: Payer: Medicare Other | Admitting: Family Medicine

## 2018-03-01 DIAGNOSIS — Z0289 Encounter for other administrative examinations: Secondary | ICD-10-CM

## 2018-03-03 DIAGNOSIS — R251 Tremor, unspecified: Secondary | ICD-10-CM | POA: Diagnosis not present

## 2018-03-03 DIAGNOSIS — D519 Vitamin B12 deficiency anemia, unspecified: Secondary | ICD-10-CM | POA: Diagnosis not present

## 2018-03-03 DIAGNOSIS — I2510 Atherosclerotic heart disease of native coronary artery without angina pectoris: Secondary | ICD-10-CM | POA: Diagnosis not present

## 2018-03-03 DIAGNOSIS — R262 Difficulty in walking, not elsewhere classified: Secondary | ICD-10-CM | POA: Diagnosis not present

## 2018-03-03 DIAGNOSIS — G903 Multi-system degeneration of the autonomic nervous system: Secondary | ICD-10-CM | POA: Diagnosis not present

## 2018-03-03 DIAGNOSIS — M6281 Muscle weakness (generalized): Secondary | ICD-10-CM | POA: Diagnosis not present

## 2018-03-03 DIAGNOSIS — I10 Essential (primary) hypertension: Secondary | ICD-10-CM | POA: Diagnosis not present

## 2018-03-04 ENCOUNTER — Encounter: Payer: Self-pay | Admitting: Family Medicine

## 2018-03-04 ENCOUNTER — Ambulatory Visit (INDEPENDENT_AMBULATORY_CARE_PROVIDER_SITE_OTHER): Payer: Medicare Other | Admitting: Family Medicine

## 2018-03-04 VITALS — BP 132/72 | HR 80 | Temp 97.8°F | Ht 62.0 in

## 2018-03-04 DIAGNOSIS — E538 Deficiency of other specified B group vitamins: Secondary | ICD-10-CM | POA: Diagnosis not present

## 2018-03-04 DIAGNOSIS — K219 Gastro-esophageal reflux disease without esophagitis: Secondary | ICD-10-CM

## 2018-03-04 MED ORDER — ESOMEPRAZOLE MAGNESIUM 40 MG PO CPDR: 40.0000 mg | DELAYED_RELEASE_CAPSULE | Freq: Every day | ORAL | 6 refills | Status: DC

## 2018-03-04 MED ORDER — CYANOCOBALAMIN 1000 MCG/ML IJ SOLN
1000.0000 ug | Freq: Once | INTRAMUSCULAR | Status: AC
  Administered 2018-03-04: 1000 ug via INTRAMUSCULAR

## 2018-03-04 NOTE — Patient Instructions (Addendum)
Start nexium 12m daily.  b12 shot today. Continue pepcid twice daily and levsin antispasmodic as needed.  Let me know how you do with this.

## 2018-03-04 NOTE — Assessment & Plan Note (Signed)
Anticipate acute worsening as she's been off PPI for a long time due to cost. Protonix prior to dexilant seems was ineffective. Will restart nexium 104m daily trial. Continue pepcid 437mbid and levsin PRN. Reviewed diet choices to help control GERD. Update wit heffect.

## 2018-03-04 NOTE — Progress Notes (Signed)
BP 132/72 (BP Location: Left Arm, Patient Position: Sitting, Cuff Size: Normal)   Pulse 80   Temp 97.8 F (36.6 C) (Oral)   Ht 5' 2"  (1.575 m)   SpO2 98%   BMI 26.52 kg/m    CC: GERD Subjective:    Patient ID: [name] [name], female    DOB: December 04, 1940, 77 y.o.   MRN: [id number]  HPI: [name] [name] is a 77 y.o. female presenting on 03/04/2018 for Gastroesophageal Reflux (Says it is worsening. Has lost appetite. Pt accompained by husband.)   GERD - notes symptoms from when she awakens in the mornings - bitter taste in the mouth. Decreased appetite, nauseated over last 3-4 wks not eating well. Worsening despite pepcid 67m BID PRN - taking BID. She has not been taking dexilant - due to cost.   No vomiting, early satiety, dysphagia.   S/p lap nissen fundoplication remotely EGD 2016 reviewed - WNL  Relevant past medical, surgical, family and social history reviewed and updated as indicated. Interim medical history since our last visit reviewed. Allergies and medications reviewed and updated. Outpatient Medications Prior to Visit  Medication Sig Dispense Refill  . albuterol (PROVENTIL HFA;VENTOLIN HFA) 108 (90 Base) MCG/ACT inhaler TAKE 2 PUFFS BY MOUTH EVERY 6 HOURS AS NEEDED FOR WHEEZE OR SHORTNESS OF BREATH 8.5 Inhaler 0  . aspirin EC 81 MG tablet Take 1 tablet (81 mg total) by mouth daily.    . clonazePAM (KLONOPIN) 1 MG tablet TAKE 0.5-1 TABLETS (0.5-1 MG TOTAL) BY MOUTH DAILY AS NEEDED FOR ANXIETY. 30 tablet 3  . docusate sodium (COLACE) 100 MG capsule Take 200 mg by mouth daily.    . famotidine (PEPCID) 40 MG tablet TAKE 1 TABLET (40 MG TOTAL) BY MOUTH 2 (TWO) TIMES DAILY AS NEEDED FOR HEARTBURN. 180 tablet 3  . furosemide (LASIX) 20 MG tablet Take 1 tablet (20 mg total) by mouth daily as needed for fluid or edema. 30 tablet 0  . hyoscyamine (LEVSIN SL) 0.125 MG SL tablet PLACE 2 TABLETS (0.25 MG TOTAL) UNDER THE TONGUE EVERY 4 (FOUR) HOURS AS NEEDED (SPASMS). 90 tablet 0  .  isosorbide mononitrate (IMDUR) 30 MG 24 hr tablet TAKE 1/2 TABLET BY MOUTH DAILY 15 tablet 11  . Melatonin 3 MG TABS Take 1 tablet (3 mg total) by mouth at bedtime.    .Marland KitchenMYRBETRIQ 50 MG TB24 tablet Take 50 mg by mouth daily.  11  . nitroGLYCERIN (NITROSTAT) 0.4 MG SL tablet Place 1 tablet (0.4 mg total) under the tongue every 5 (five) minutes as needed (chest pain). 25 tablet 3  . propranolol (INDERAL) 10 MG tablet TAKE 1 TABLET BY MOUTH 3 TIMES DAILY AS NEEDED (TREMORS). 180 tablet 0  . rosuvastatin (CRESTOR) 20 MG tablet TAKE 0.5 TABLETS (10 MG TOTAL) BY MOUTH DAILY. 45 tablet 1  . sertraline (ZOLOFT) 25 MG tablet TAKE 1 TABLET (25 MG TOTAL) BY MOUTH DAILY. 90 tablet 3  . SYNTHROID 112 MCG tablet TAKE 1 TABLET (112 MCG TOTAL) BY MOUTH DAILY BEFORE BREAKFAST. 30 tablet 6  . trimethoprim (TRIMPEX) 100 MG tablet Take 100 mg by mouth daily.  11  . DEXILANT 60 MG capsule TAKE 1 CAPSULE (60 MG TOTAL) BY MOUTH DAILY. 30 capsule 2   No facility-administered medications prior to visit.      Per HPI unless specifically indicated in ROS section below Review of Systems     Objective:    BP 132/72 (BP Location: Left Arm, Patient Position: Sitting,  Cuff Size: Normal)   Pulse 80   Temp 97.8 F (36.6 C) (Oral)   Ht 5' 2"  (1.575 m)   SpO2 98%   BMI 26.52 kg/m   Wt Readings from Last 3 Encounters:  06/14/17 145 lb (65.8 kg)  04/14/17 145 lb (65.8 kg)  03/07/17 145 lb (65.8 kg)    Physical Exam  Constitutional: She appears well-developed and well-nourished.  In wheelchair  Abdominal: Soft. Bowel sounds are normal. She exhibits no distension and no mass. There is no tenderness. There is no rebound and no guarding.  Neurological:  Worsening action tremors  Nursing note and vitals reviewed.  Results for orders placed or performed in visit on 05/18/17  Comprehensive metabolic panel  Result Value Ref Range   Sodium 139 135 - 145 mEq/L   Potassium 4.7 3.5 - 5.1 mEq/L   Chloride 103 96 - 112  mEq/L   CO2 28 19 - 32 mEq/L   Glucose, Bld 110 (H) 70 - 99 mg/dL   BUN 15 6 - 23 mg/dL   Creatinine, Ser 0.88 0.40 - 1.20 mg/dL   Total Bilirubin 0.4 0.2 - 1.2 mg/dL   Alkaline Phosphatase 110 39 - 117 U/L   AST 16 0 - 37 U/L   ALT 14 0 - 35 U/L   Total Protein 6.7 6.0 - 8.3 g/dL   Albumin 4.1 3.5 - 5.2 g/dL   Calcium 10.1 8.4 - 10.5 mg/dL   GFR 66.22 >60.00 mL/min  Sedimentation rate  Result Value Ref Range   Sed Rate 7 0 - 30 mm/hr  VITAMIN D 25 Hydroxy (Vit-D Deficiency, Fractures)  Result Value Ref Range   VITD 111.87 (HH) 30.00 - 100.00 ng/mL  CBC with Differential/Platelet  Result Value Ref Range   WBC 5.9 4.0 - 10.5 K/uL   RBC 4.63 3.87 - 5.11 Mil/uL   Hemoglobin 13.6 12.0 - 15.0 g/dL   HCT 40.2 36.0 - 46.0 %   MCV 86.8 78.0 - 100.0 fl   MCHC 33.9 30.0 - 36.0 g/dL   RDW 13.8 11.5 - 15.5 %   Platelets 283.0 150.0 - 400.0 K/uL   Neutrophils Relative % 57.7 43.0 - 77.0 %   Lymphocytes Relative 32.0 12.0 - 46.0 %   Monocytes Relative 8.6 3.0 - 12.0 %   Eosinophils Relative 1.2 0.0 - 5.0 %   Basophils Relative 0.5 0.0 - 3.0 %   Neutro Abs 3.4 1.4 - 7.7 K/uL   Lymphs Abs 1.9 0.7 - 4.0 K/uL   Monocytes Absolute 0.5 0.1 - 1.0 K/uL   Eosinophils Absolute 0.1 0.0 - 0.7 K/uL   Basophils Absolute 0.0 0.0 - 0.1 K/uL      Assessment & Plan:   Problem List Items Addressed This Visit    GERD - Primary    Anticipate acute worsening as she's been off PPI for a long time due to cost. Protonix prior to dexilant seems was ineffective. Will restart nexium 27m daily trial. Continue pepcid 436mbid and levsin PRN. Reviewed diet choices to help control GERD. Update wit heffect.      Relevant Medications   esomeprazole (NEXIUM) 40 MG capsule   Vitamin B12 deficiency    B12 shot today       Relevant Medications   cyanocobalamin ((VITAMIN B-12)) injection 1,000 mcg (Completed)       Meds ordered this encounter  Medications  . cyanocobalamin ((VITAMIN B-12)) injection 1,000  mcg  . esomeprazole (NEXIUM) 40 MG capsule  Sig: Take 1 capsule (40 mg total) by mouth daily.    Dispense:  30 capsule    Refill:  6   No orders of the defined types were placed in this encounter.   Follow up plan: Return if symptoms worsen or fail to improve.  Ria Bush, MD

## 2018-03-04 NOTE — Assessment & Plan Note (Signed)
B12 shot today. 

## 2018-03-08 DIAGNOSIS — R251 Tremor, unspecified: Secondary | ICD-10-CM | POA: Diagnosis not present

## 2018-03-08 DIAGNOSIS — M6281 Muscle weakness (generalized): Secondary | ICD-10-CM | POA: Diagnosis not present

## 2018-03-08 DIAGNOSIS — D519 Vitamin B12 deficiency anemia, unspecified: Secondary | ICD-10-CM | POA: Diagnosis not present

## 2018-03-08 DIAGNOSIS — I10 Essential (primary) hypertension: Secondary | ICD-10-CM | POA: Diagnosis not present

## 2018-03-08 DIAGNOSIS — G903 Multi-system degeneration of the autonomic nervous system: Secondary | ICD-10-CM | POA: Diagnosis not present

## 2018-03-08 DIAGNOSIS — I2510 Atherosclerotic heart disease of native coronary artery without angina pectoris: Secondary | ICD-10-CM | POA: Diagnosis not present

## 2018-03-08 DIAGNOSIS — R262 Difficulty in walking, not elsewhere classified: Secondary | ICD-10-CM | POA: Diagnosis not present

## 2018-03-10 DIAGNOSIS — M6281 Muscle weakness (generalized): Secondary | ICD-10-CM | POA: Diagnosis not present

## 2018-03-10 DIAGNOSIS — I2510 Atherosclerotic heart disease of native coronary artery without angina pectoris: Secondary | ICD-10-CM | POA: Diagnosis not present

## 2018-03-10 DIAGNOSIS — I10 Essential (primary) hypertension: Secondary | ICD-10-CM | POA: Diagnosis not present

## 2018-03-10 DIAGNOSIS — D519 Vitamin B12 deficiency anemia, unspecified: Secondary | ICD-10-CM | POA: Diagnosis not present

## 2018-03-10 DIAGNOSIS — G903 Multi-system degeneration of the autonomic nervous system: Secondary | ICD-10-CM | POA: Diagnosis not present

## 2018-03-10 DIAGNOSIS — R262 Difficulty in walking, not elsewhere classified: Secondary | ICD-10-CM | POA: Diagnosis not present

## 2018-03-10 DIAGNOSIS — R251 Tremor, unspecified: Secondary | ICD-10-CM | POA: Diagnosis not present

## 2018-03-14 ENCOUNTER — Telehealth: Payer: Self-pay | Admitting: Family Medicine

## 2018-03-14 NOTE — Telephone Encounter (Signed)
[facility] [phone]) calling to see if the pt's prescription could be refilled early due to the pt losing her prescripton

## 2018-03-14 NOTE — Telephone Encounter (Signed)
Copied from [place] [id number]. Topic: Quick Communication - See Telephone Encounter >> Mar 14, 2018 10:15 AM Hewitt Shorts wrote: Pt pharmacy is calling [facility] [place] [phone] to see if a early refill could be possible on clonazepam due to the pr losing her perscription  Best number [phone]

## 2018-03-15 DIAGNOSIS — I2510 Atherosclerotic heart disease of native coronary artery without angina pectoris: Secondary | ICD-10-CM | POA: Diagnosis not present

## 2018-03-15 DIAGNOSIS — I10 Essential (primary) hypertension: Secondary | ICD-10-CM | POA: Diagnosis not present

## 2018-03-15 DIAGNOSIS — M6281 Muscle weakness (generalized): Secondary | ICD-10-CM | POA: Diagnosis not present

## 2018-03-15 DIAGNOSIS — D519 Vitamin B12 deficiency anemia, unspecified: Secondary | ICD-10-CM | POA: Diagnosis not present

## 2018-03-15 DIAGNOSIS — R262 Difficulty in walking, not elsewhere classified: Secondary | ICD-10-CM | POA: Diagnosis not present

## 2018-03-15 DIAGNOSIS — G903 Multi-system degeneration of the autonomic nervous system: Secondary | ICD-10-CM | POA: Diagnosis not present

## 2018-03-15 DIAGNOSIS — R251 Tremor, unspecified: Secondary | ICD-10-CM | POA: Diagnosis not present

## 2018-03-15 NOTE — Telephone Encounter (Signed)
Okay with me.  Have her secure her sx.  Thanks.

## 2018-03-15 NOTE — Telephone Encounter (Signed)
Left detailed message on voicemail.  

## 2018-03-15 NOTE — Telephone Encounter (Signed)
Pt last seen 01/25/18; last refilled clonazepam # 30 x 3 on 01/17/18. Pt does not need refill but does need OK to fill early due to pt losing med. Dr Darnell Level out of office.Please advise.

## 2018-03-17 ENCOUNTER — Ambulatory Visit (INDEPENDENT_AMBULATORY_CARE_PROVIDER_SITE_OTHER): Payer: Medicare Other | Admitting: Family Medicine

## 2018-03-17 ENCOUNTER — Encounter: Payer: Self-pay | Admitting: Family Medicine

## 2018-03-17 VITALS — BP 124/68 | HR 68 | Temp 98.0°F

## 2018-03-17 DIAGNOSIS — N3000 Acute cystitis without hematuria: Secondary | ICD-10-CM | POA: Diagnosis not present

## 2018-03-17 DIAGNOSIS — N3020 Other chronic cystitis without hematuria: Secondary | ICD-10-CM | POA: Diagnosis not present

## 2018-03-17 LAB — POC URINALSYSI DIPSTICK (AUTOMATED)
BILIRUBIN UA: NEGATIVE
Glucose, UA: NEGATIVE
KETONES UA: NEGATIVE
Nitrite, UA: NEGATIVE
PH UA: 7.5 (ref 5.0–8.0)
PROTEIN UA: NEGATIVE
Urobilinogen, UA: 0.2 E.U./dL

## 2018-03-17 MED ORDER — CIPROFLOXACIN HCL 250 MG PO TABS: 250.0000 mg | ORAL_TABLET | Freq: Two times a day (BID) | ORAL | 0 refills | Status: DC

## 2018-03-17 NOTE — Progress Notes (Signed)
Subjective:    Patient ID: [name] [name], female    DOB: [birth year], 77 y.o.   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)  Also chronic cystitis with daily tremethoprim for prophylaxis   Has been having chills  She has a baseline self rep temp of 96-97 and she gets chills over 98  Tremor worsens when sick or when she has chills   Temp: 98 F (36.7 C)   Thinks she may have a uti  Never has symptoms unless elevated temp   Sees Dr Matilde Sprang for urology  Last uti was about a year ago   No n/v   No change in urine appearance or smell  No blood in urine  Good water drinker    77 yo pt of Dr Darnell Level    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 She takes klonopin and propranolol for this   UA is pos today  Results for orders placed or performed in visit on 03/17/18  POCT Urinalysis Dipstick (Automated)  Result Value Ref Range   Color, UA Straw    Clarity, UA Clear    Glucose, UA Negative    Bilirubin, UA Negative    Ketones, UA Negative    Spec Grav, UA <=1.005 (A) 1.010 - 1.025   Blood, UA 200 Ery/uL    pH, UA 7.5 5.0 - 8.0   Protein, UA Negative    Urobilinogen, UA 0.2 0.2 or 1.0 E.U./dL   Nitrite, UA Negative    Leukocytes, UA Large (3+) (A) Negative     Lab Results  Component Value Date   CREATININE 0.88 05/18/2017   BUN 15 05/18/2017   NA 139 05/18/2017   K 4.7 05/18/2017   CL 103 05/18/2017   CO2 28 05/18/2017   She prefers cipro for utis  No problems with it in the past  Keflex    Patient Active Problem List   Diagnosis Date Noted  . Acute cystitis 03/17/2018  . Chronic insomnia 07/30/2017  . Bone lesion 05/18/2017  . Vitamin D deficiency 05/18/2017  . Osteoarthritis of cervical spine 10/06/2016  . General weakness 02/26/2016  . Chills 02/26/2016  . Other fatigue 02/04/2016  . Neurogenic bladder   . Chronic cystitis 04/18/2015  . Pedal edema 04/01/2015  . Constipation 12/14/2014  .  Medicare annual wellness visit, subsequent 11/27/2014  . Advanced care planning/counseling discussion 11/27/2014  . Osteoporosis   . Vitamin B12 deficiency 06/07/2014  . Recurrent falls 02/06/2014  . Multiple system atrophy ([place])   . Dysphagia 07/28/2013  . Shoulder pain, left 07/20/2012  . 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 Health maintenance examination 08/26/2011  . Murmur 05/27/2011  . HYPERCHOLESTEROLEMIA  IIA 05/14/2010  . VENOUS INSUFFICIENCY, LEGS 06/21/2009  . CAD (coronary artery disease) 06/09/2008  . Hypothyroidism 05/15/2008  . ASTHMA 05/15/2008  . Depression 11/07/2007  . Tremor due to disorder of central nervous system 11/07/2007  . GERD 11/07/2007  . VAGINITIS, ATROPHIC 11/07/2007   Past Medical History:  Diagnosis Date  . Asthma   . CAD (coronary artery disease)    myoview 5/08:  EF 76%, no scar, no ischemia, +ECG changes with exercise;  cath 04/08/07:  pLAD 30%, mLAD 60-70% - med tx.  . Chronic cystitis    recurrent UTIs, started bactrim ppx (McDiarmid)  . Depression   . Familial tremor    followed by Dr. Erling Cruz  . GERD (gastroesophageal reflux  disease)    s/p nissen  . History of pneumonia   . HLD (hyperlipidemia)   . Hydronephrosis, bilateral   . Hypothyroidism   . Internal hemorrhoids   . Multiple system atrophy [facility])    [facility], now [name]  . Neurogenic bladder    [place]/MacDiarmid  . Osteoporosis 2016   DEXA T-4.4 spine deteriorated since 2012  . Unspecified chronic bronchitis ([place])   . VAGINITIS, ATROPHIC 11/07/2007   Past Surgical History:  Procedure Laterality Date  . COLONOSCOPY N/A 08/29/2013   int hemm; Inda Castle, MD  . CYSTECTOMY    . DEXA  12/2014   T -4.4 spine  . ESOPHAGOGASTRODUODENOSCOPY N/A 01/09/2015   Procedure: ESOPHAGOGASTRODUODENOSCOPY (EGD);  Surgeon: Inda Castle, MD;  Location: [facility];  Service: Endoscopy;  Laterality: N/A;  help with transfers  . LAPAROSCOPIC NISSEN FUNDOPLICATION    . TUBAL  LIGATION    . TUMOR REMOVAL     Social History   Tobacco Use  . Smoking status: Never Smoker  . Smokeless tobacco: Never Used  Substance Use Topics  . Alcohol use: No    Alcohol/week: 0.0 oz  . Drug use: No   Family History  Problem Relation Age of Onset  . Hypertension Mother   . Coronary artery disease Father   . Heart attack Father   . Heart disease Father   . Diabetes Daughter   . Breast cancer Maternal Aunt   . Coronary artery disease Daughter   . Colon cancer Maternal Aunt   . Multiple sclerosis Daughter   . Stroke Daughter   . Heart attack Daughter    Allergies  Allergen Reactions  . Keflex [Cephalexin] Diarrhea   Current Outpatient Medications on File Prior to Visit  Medication Sig Dispense Refill  . albuterol (PROVENTIL HFA;VENTOLIN HFA) 108 (90 Base) MCG/ACT inhaler TAKE 2 PUFFS BY MOUTH EVERY 6 HOURS AS NEEDED FOR WHEEZE OR SHORTNESS OF BREATH 8.5 Inhaler 0  . aspirin EC 81 MG tablet Take 1 tablet (81 mg total) by mouth daily.    . clonazePAM (KLONOPIN) 1 MG tablet TAKE 0.5-1 TABLETS (0.5-1 MG TOTAL) BY MOUTH DAILY AS NEEDED FOR ANXIETY. 30 tablet 3  . docusate sodium (COLACE) 100 MG capsule Take 200 mg by mouth daily.    Marland Kitchen esomeprazole (NEXIUM) 40 MG capsule Take 1 capsule (40 mg total) by mouth daily. 30 capsule 6  . famotidine (PEPCID) 40 MG tablet TAKE 1 TABLET (40 MG TOTAL) BY MOUTH 2 (TWO) TIMES DAILY AS NEEDED FOR HEARTBURN. 180 tablet 3  . furosemide (LASIX) 20 MG tablet Take 1 tablet (20 mg total) by mouth daily as needed for fluid or edema. 30 tablet 0  . hyoscyamine (LEVSIN SL) 0.125 MG SL tablet PLACE 2 TABLETS (0.25 MG TOTAL) UNDER THE TONGUE EVERY 4 (FOUR) HOURS AS NEEDED (SPASMS). 90 tablet 0  . isosorbide mononitrate (IMDUR) 30 MG 24 hr tablet TAKE 1/2 TABLET BY MOUTH DAILY 15 tablet 11  . Melatonin 3 MG TABS Take 1 tablet (3 mg total) by mouth at bedtime.    Marland Kitchen MYRBETRIQ 50 MG TB24 tablet Take 50 mg by mouth daily.  11  . nitroGLYCERIN  (NITROSTAT) 0.4 MG SL tablet Place 1 tablet (0.4 mg total) under the tongue every 5 (five) minutes as needed (chest pain). 25 tablet 3  . propranolol (INDERAL) 10 MG tablet TAKE 1 TABLET BY MOUTH 3 TIMES DAILY AS NEEDED (TREMORS). 180 tablet 0  . rosuvastatin (CRESTOR) 20 MG tablet  TAKE 0.5 TABLETS (10 MG TOTAL) BY MOUTH DAILY. 45 tablet 1  . sertraline (ZOLOFT) 25 MG tablet TAKE 1 TABLET (25 MG TOTAL) BY MOUTH DAILY. 90 tablet 3  . SYNTHROID 112 MCG tablet TAKE 1 TABLET (112 MCG TOTAL) BY MOUTH DAILY BEFORE BREAKFAST. 30 tablet 6  . trimethoprim (TRIMPEX) 100 MG tablet Take 100 mg by mouth daily.  11   No current facility-administered medications on file prior to visit.     Review of Systems  Constitutional: Positive for chills and fatigue. Negative for activity change, appetite change, diaphoresis, fever and unexpected weight change.  HENT: Negative for congestion, ear pain, rhinorrhea, sinus pressure and sore throat.   Eyes: Negative for pain, redness and visual disturbance.  Respiratory: Negative for cough, shortness of breath and wheezing.   Cardiovascular: Negative for chest pain and palpitations.  Gastrointestinal: Negative for abdominal pain, blood in stool, constipation and diarrhea.  Endocrine: Negative for polydipsia and polyuria.  Genitourinary: Positive for frequency. Negative for decreased urine volume, dysuria, flank pain, hematuria, pelvic pain, urgency and vaginal discharge.  Musculoskeletal: Positive for gait problem. Negative for arthralgias, back pain and myalgias.  Skin: Negative for pallor and rash.  Allergic/Immunologic: Negative for environmental allergies.  Neurological: Positive for weakness. Negative for dizziness, syncope and headaches.       MSA wheelchair bound   Hematological: Negative for adenopathy. Does not bruise/bleed easily.  Psychiatric/Behavioral: Negative for decreased concentration and dysphoric mood. The patient is not nervous/anxious.          Objective:   Physical Exam  Constitutional: She appears well-developed and well-nourished. No distress.  Frail appearing elderly female in wheelchair-leaning to the left  Hand tremor noted   HENT:  Head: Normocephalic and atraumatic.  Mouth/Throat: Oropharynx is clear and moist.  Eyes: Pupils are equal, round, and reactive to light. Conjunctivae and EOM are normal.  Neck: Neck supple.  Cardiovascular: Normal rate and regular rhythm.  Pulmonary/Chest: Effort normal and breath sounds normal. She has no wheezes. She has no rales.  Abdominal: Soft. Bowel sounds are normal. She exhibits no distension. There is no tenderness.  No suprapubic tenderness or fullness    Musculoskeletal:  No cva tenderness  Lymphadenopathy:    She has no cervical adenopathy.  Neurological: She is alert. No cranial nerve deficit. Coordination abnormal.  Skin: Skin is warm and dry. Capillary refill takes less than 2 seconds. No rash noted. She is not diaphoretic. No pallor.  Psychiatric: She has a normal mood and affect.  Pleasant           Assessment & Plan:   Problem List Items Addressed This Visit      Genitourinary   Acute cystitis - Primary    Acute on chronic with pos ua  On trimethoprim prophylaxis  tx with cipro (pt states this works best for her and she tolerates well)- she cannot take cephalosporins  ucx pending  Good fluid intake  Update if not starting to improve in several days  or if worsening    Meds ordered this encounter  Medications  . ciprofloxacin (CIPRO) 250 MG tablet    Sig: Take 1 tablet (250 mg total) by mouth 2 (two) times daily.    Dispense:  10 tablet    Refill:  0         Relevant Orders   Urine Culture   Chronic cystitis   Relevant Orders   POCT Urinalysis Dipstick (Automated) (Completed)

## 2018-03-17 NOTE — Patient Instructions (Signed)
Keep up the good job with fluids  Take the cipro as directed  We will call you with a culture result when it returns   Update if not starting to improve in several days or if worsening

## 2018-03-17 NOTE — Assessment & Plan Note (Signed)
Acute on chronic with pos ua  On trimethoprim prophylaxis  tx with cipro (pt states this works best for her and she tolerates well)- she cannot take cephalosporins  ucx pending  Good fluid intake  Update if not starting to improve in several days  or if worsening    Meds ordered this encounter  Medications  . ciprofloxacin (CIPRO) 250 MG tablet    Sig: Take 1 tablet (250 mg total) by mouth 2 (two) times daily.    Dispense:  10 tablet    Refill:  0

## 2018-03-18 LAB — URINE CULTURE
MICRO NUMBER: 90567678
SPECIMEN QUALITY:: ADEQUATE

## 2018-03-22 ENCOUNTER — Other Ambulatory Visit: Payer: Self-pay | Admitting: Family Medicine

## 2018-03-22 DIAGNOSIS — G903 Multi-system degeneration of the autonomic nervous system: Secondary | ICD-10-CM | POA: Diagnosis not present

## 2018-03-22 DIAGNOSIS — I10 Essential (primary) hypertension: Secondary | ICD-10-CM | POA: Diagnosis not present

## 2018-03-22 DIAGNOSIS — R262 Difficulty in walking, not elsewhere classified: Secondary | ICD-10-CM | POA: Diagnosis not present

## 2018-03-22 DIAGNOSIS — M6281 Muscle weakness (generalized): Secondary | ICD-10-CM | POA: Diagnosis not present

## 2018-03-22 DIAGNOSIS — R251 Tremor, unspecified: Secondary | ICD-10-CM | POA: Diagnosis not present

## 2018-03-22 DIAGNOSIS — D519 Vitamin B12 deficiency anemia, unspecified: Secondary | ICD-10-CM | POA: Diagnosis not present

## 2018-03-22 DIAGNOSIS — I2510 Atherosclerotic heart disease of native coronary artery without angina pectoris: Secondary | ICD-10-CM | POA: Diagnosis not present

## 2018-03-24 DIAGNOSIS — M6281 Muscle weakness (generalized): Secondary | ICD-10-CM | POA: Diagnosis not present

## 2018-03-24 DIAGNOSIS — I2510 Atherosclerotic heart disease of native coronary artery without angina pectoris: Secondary | ICD-10-CM | POA: Diagnosis not present

## 2018-03-24 DIAGNOSIS — G903 Multi-system degeneration of the autonomic nervous system: Secondary | ICD-10-CM | POA: Diagnosis not present

## 2018-03-24 DIAGNOSIS — R251 Tremor, unspecified: Secondary | ICD-10-CM | POA: Diagnosis not present

## 2018-03-24 DIAGNOSIS — I10 Essential (primary) hypertension: Secondary | ICD-10-CM | POA: Diagnosis not present

## 2018-03-24 DIAGNOSIS — D519 Vitamin B12 deficiency anemia, unspecified: Secondary | ICD-10-CM | POA: Diagnosis not present

## 2018-03-24 DIAGNOSIS — R262 Difficulty in walking, not elsewhere classified: Secondary | ICD-10-CM | POA: Diagnosis not present

## 2018-03-29 ENCOUNTER — Ambulatory Visit: Payer: Self-pay

## 2018-03-29 DIAGNOSIS — R262 Difficulty in walking, not elsewhere classified: Secondary | ICD-10-CM | POA: Diagnosis not present

## 2018-03-29 DIAGNOSIS — I2510 Atherosclerotic heart disease of native coronary artery without angina pectoris: Secondary | ICD-10-CM | POA: Diagnosis not present

## 2018-03-29 DIAGNOSIS — M6281 Muscle weakness (generalized): Secondary | ICD-10-CM | POA: Diagnosis not present

## 2018-03-29 DIAGNOSIS — G903 Multi-system degeneration of the autonomic nervous system: Secondary | ICD-10-CM | POA: Diagnosis not present

## 2018-03-29 DIAGNOSIS — D519 Vitamin B12 deficiency anemia, unspecified: Secondary | ICD-10-CM | POA: Diagnosis not present

## 2018-03-29 DIAGNOSIS — R251 Tremor, unspecified: Secondary | ICD-10-CM | POA: Diagnosis not present

## 2018-03-29 DIAGNOSIS — I10 Essential (primary) hypertension: Secondary | ICD-10-CM | POA: Diagnosis not present

## 2018-03-29 NOTE — Telephone Encounter (Signed)
Patient called in with c/o "black stools." She says "I had one yesterday and again this morning. It's just black, no red and constipated." I asked about symptoms, she says "I'm getting over vertigo, but I don't have pain, fever, or diarrhea. My husband called my GI doctor and they couldn't see me for 2 weeks. The appointment was made anyway and I decided to call you because I can't wait 2 weeks." According to protocol, see PCP within 3 days, appointment scheduled for tomorrow at 1545 with Dr. Danise Mina, care advice given, patient verbalized understanding.   Reason for Disposition . MILD rectal bleeding (more than just a few drops or streaks)  Answer Assessment - Initial Assessment Questions 1. COLOR: "What color is it?" "Is that color in part or all of the stool?"     Black 2. ONSET: "When was the unusual color first noted?"     Yesterday and today 3. CAUSE: "Have you eaten any food or taken any medicine of this color?" (See listing in BACKGROUND)     No 4. OTHER SYMPTOMS: "Do you have any other symptoms?" (e.g., diarrhea, jaundice, abdominal pain, fever).     Denies  Answer Assessment - Initial Assessment Questions 1. APPEARANCE of BLOOD: "What color is it?" "Is it passed separately, on the surface of the stool, or mixed in with the stool?"      Black stool 2. AMOUNT: "How much blood was passed?"      Can't tell 3. FREQUENCY: "How many times has blood been passed with the stools?"      Twice, yesterday and today 4. ONSET: "When was the blood first seen in the stools?" (Days or weeks)      Yesterday 5. DIARRHEA: "Is there also some diarrhea?" If so, ask: "How many diarrhea stools were passed in past 24 hours?"      No 6. CONSTIPATION: "Do you have constipation?" If so, "How bad is it?"     Yes 7. RECURRENT SYMPTOMS: "Have you had blood in your stools before?" If so, ask: "When was the last time?" and "What happened that time?"      No 8. BLOOD THINNERS: "Do you take any blood  thinners?" (e.g., Coumadin/warfarin, Pradaxa/dabigatran, aspirin)     Aspirin 9. OTHER SYMPTOMS: "Do you have any other symptoms?"  (e.g., abdominal pain, vomiting, dizziness, fever)     No 10. PREGNANCY: "Is there any chance you are pregnant?" "When was your last menstrual period?"       No  Protocols used: RECTAL BLEEDING-A-AH, STOOLS - UNUSUAL COLOR-A-AH

## 2018-03-30 ENCOUNTER — Encounter: Payer: Self-pay | Admitting: Family Medicine

## 2018-03-30 ENCOUNTER — Ambulatory Visit (INDEPENDENT_AMBULATORY_CARE_PROVIDER_SITE_OTHER): Payer: Medicare Other | Admitting: Family Medicine

## 2018-03-30 VITALS — BP 124/68 | HR 90 | Temp 98.2°F | Ht 62.0 in

## 2018-03-30 DIAGNOSIS — A09 Infectious gastroenteritis and colitis, unspecified: Secondary | ICD-10-CM | POA: Insufficient documentation

## 2018-03-30 DIAGNOSIS — K921 Melena: Secondary | ICD-10-CM | POA: Diagnosis not present

## 2018-03-30 NOTE — Assessment & Plan Note (Signed)
Self limited episode of dark stools, but not tarry or more foul smelling pointing against melena. No new foods or meds other than mylanta - doubt related. Check CBC, iFOB today. She has already scheduled appt with GI for f/u.

## 2018-03-30 NOTE — Progress Notes (Signed)
BP 124/68 (BP Location: Left Arm, Patient Position: Sitting, Cuff Size: Normal)   Pulse 90   Temp 98.2 F (36.8 C) (Oral)   Ht 5' 2"  (1.575 m)   SpO2 96%   BMI 26.52 kg/m    CC: stool changes Subjective:    Patient ID: [name] [name], female    DOB: Mar 18, 1941, 77 y.o.   MRN: [id number]  HPI: [name] [name] is a 77 y.o. female presenting on 03/30/2018 for Stool Color Change (Reports black stool on 03/28/18 and was constipated at the time. Says it is back to brown now.)   Here with husband [name] [name].   2d ago noticed black stools associated with constipation. Color has returned to normal brown. Has been taking dulcolax stool softeners 2-3 a day, did take some mylanta prior. Not tarry, not more foul smelling then normal.  Known int/ext hemorrhoids - denies current bleeding.   She regularly takes nexium 66m daily, levsin SL PRN.   Saw Dr TGlori Bickersearlier this month with UTI - treated with cipro [id number] course.   COLONOSCOPY 08/29/2013 int hemm; RoInda CastleMD  She has upcoming appt with Dr PyHilarie Fredricksonn 3 wks.   Relevant past medical, surgical, family and social history reviewed and updated as indicated. Interim medical history since our last visit reviewed. Allergies and medications reviewed and updated. Outpatient Medications Prior to Visit  Medication Sig Dispense Refill  . albuterol (PROVENTIL HFA;VENTOLIN HFA) 108 (90 Base) MCG/ACT inhaler TAKE 2 PUFFS BY MOUTH EVERY 6 HOURS AS NEEDED FOR WHEEZE OR SHORTNESS OF BREATH 8.5 Inhaler 0  . aspirin EC 81 MG tablet Take 1 tablet (81 mg total) by mouth daily.    . clonazePAM (KLONOPIN) 1 MG tablet TAKE 0.5-1 TABLETS (0.5-1 MG TOTAL) BY MOUTH DAILY AS NEEDED FOR ANXIETY. 30 tablet 3  . docusate sodium (COLACE) 100 MG capsule Take 200 mg by mouth daily.    . Marland Kitchensomeprazole (NEXIUM) 40 MG capsule Take 1 capsule (40 mg total) by mouth daily. 30 capsule 6  . famotidine (PEPCID) 40 MG tablet TAKE 1 TABLET (40 MG TOTAL) BY MOUTH 2 (TWO) TIMES  DAILY AS NEEDED FOR HEARTBURN. 180 tablet 3  . furosemide (LASIX) 20 MG tablet Take 1 tablet (20 mg total) by mouth daily as needed for fluid or edema. 30 tablet 0  . hyoscyamine (LEVSIN SL) 0.125 MG SL tablet PLACE 2 TABLETS (0.25 MG TOTAL) UNDER THE TONGUE EVERY 4 (FOUR) HOURS AS NEEDED (SPASMS). 90 tablet 0  . isosorbide mononitrate (IMDUR) 30 MG 24 hr tablet TAKE 1/2 TABLET BY MOUTH DAILY 15 tablet 11  . MYRBETRIQ 50 MG TB24 tablet Take 50 mg by mouth daily.  11  . nitroGLYCERIN (NITROSTAT) 0.4 MG SL tablet Place 1 tablet (0.4 mg total) under the tongue every 5 (five) minutes as needed (chest pain). 25 tablet 3  . propranolol (INDERAL) 10 MG tablet TAKE 1 TABLET BY MOUTH 3 TIMES DAILY AS NEEDED (TREMORS). 180 tablet 0  . rosuvastatin (CRESTOR) 20 MG tablet TAKE 0.5 TABLETS (10 MG TOTAL) BY MOUTH DAILY. 45 tablet 1  . sertraline (ZOLOFT) 25 MG tablet TAKE 1 TABLET (25 MG TOTAL) BY MOUTH DAILY. 90 tablet 3  . SYNTHROID 112 MCG tablet TAKE 1 TABLET (112 MCG TOTAL) BY MOUTH DAILY BEFORE BREAKFAST. 30 tablet 1  . trimethoprim (TRIMPEX) 100 MG tablet Take 100 mg by mouth daily.  11  . ciprofloxacin (CIPRO) 250 MG tablet Take 1 tablet (250 mg total) by mouth  2 (two) times daily. 10 tablet 0  . Melatonin 3 MG TABS Take 1 tablet (3 mg total) by mouth at bedtime.     No facility-administered medications prior to visit.      Per HPI unless specifically indicated in ROS section below Review of Systems     Objective:    BP 124/68 (BP Location: Left Arm, Patient Position: Sitting, Cuff Size: Normal)   Pulse 90   Temp 98.2 F (36.8 C) (Oral)   Ht 5' 2"  (1.575 m)   SpO2 96%   BMI 26.52 kg/m   Wt Readings from Last 3 Encounters:  06/14/17 145 lb (65.8 kg)  04/14/17 145 lb (65.8 kg)  03/07/17 145 lb (65.8 kg)    Physical Exam  Constitutional: She appears well-developed and well-nourished. No distress.  Abdominal: Soft. Bowel sounds are normal. She exhibits no distension and no mass. There is  no tenderness. There is no rebound and no guarding. No hernia.  Psychiatric: She has a normal mood and affect.  Nursing note and vitals reviewed.     Assessment & Plan:   Problem List Items Addressed This Visit    Black stool - Primary    Self limited episode of dark stools, but not tarry or more foul smelling pointing against melena. No new foods or meds other than mylanta - doubt related. Check CBC, iFOB today. She has already scheduled appt with GI for f/u.       Relevant Orders   Fecal occult blood, imunochemical   CBC with Differential/Platelet       No orders of the defined types were placed in this encounter.  Orders Placed This Encounter  Procedures  . Fecal occult blood, imunochemical    Standing Status:   Future    Standing Expiration Date:   03/31/2019  . CBC with Differential/Platelet    Follow up plan: Return if symptoms worsen or fail to improve.  Ria Bush, MD

## 2018-03-30 NOTE — Patient Instructions (Addendum)
I'm glad stools are back to normal. Stool kit today, blood test today. We will be in touch with results.

## 2018-03-31 LAB — CBC WITH DIFFERENTIAL/PLATELET
BASOS ABS: 0.1 10*3/uL (ref 0.0–0.1)
Basophils Relative: 1.1 % (ref 0.0–3.0)
EOS ABS: 0.2 10*3/uL (ref 0.0–0.7)
Eosinophils Relative: 2.7 % (ref 0.0–5.0)
HEMATOCRIT: 38.9 % (ref 36.0–46.0)
HEMOGLOBIN: 13.3 g/dL (ref 12.0–15.0)
LYMPHS PCT: 26.6 % (ref 12.0–46.0)
Lymphs Abs: 2.1 10*3/uL (ref 0.7–4.0)
MCHC: 34.2 g/dL (ref 30.0–36.0)
MCV: 88.4 fl (ref 78.0–100.0)
MONOS PCT: 9.3 % (ref 3.0–12.0)
Monocytes Absolute: 0.7 10*3/uL (ref 0.1–1.0)
NEUTROS ABS: 4.8 10*3/uL (ref 1.4–7.7)
Neutrophils Relative %: 60.3 % (ref 43.0–77.0)
PLATELETS: 329 10*3/uL (ref 150.0–400.0)
RBC: 4.4 Mil/uL (ref 3.87–5.11)
RDW: 13.1 % (ref 11.5–15.5)
WBC: 8 10*3/uL (ref 4.0–10.5)

## 2018-04-04 ENCOUNTER — Other Ambulatory Visit: Payer: Self-pay | Admitting: Cardiovascular Disease

## 2018-04-05 ENCOUNTER — Other Ambulatory Visit: Payer: Self-pay

## 2018-04-05 DIAGNOSIS — I2510 Atherosclerotic heart disease of native coronary artery without angina pectoris: Secondary | ICD-10-CM | POA: Diagnosis not present

## 2018-04-05 DIAGNOSIS — R262 Difficulty in walking, not elsewhere classified: Secondary | ICD-10-CM | POA: Diagnosis not present

## 2018-04-05 DIAGNOSIS — M6281 Muscle weakness (generalized): Secondary | ICD-10-CM | POA: Diagnosis not present

## 2018-04-05 DIAGNOSIS — G903 Multi-system degeneration of the autonomic nervous system: Secondary | ICD-10-CM | POA: Diagnosis not present

## 2018-04-05 DIAGNOSIS — I10 Essential (primary) hypertension: Secondary | ICD-10-CM | POA: Diagnosis not present

## 2018-04-05 DIAGNOSIS — R251 Tremor, unspecified: Secondary | ICD-10-CM | POA: Diagnosis not present

## 2018-04-05 DIAGNOSIS — D519 Vitamin B12 deficiency anemia, unspecified: Secondary | ICD-10-CM | POA: Diagnosis not present

## 2018-04-05 MED ORDER — CLONAZEPAM 1 MG PO TABS: 0.5000 mg | ORAL_TABLET | Freq: Every day | ORAL | 0 refills | Status: DC | PRN

## 2018-04-05 NOTE — Telephone Encounter (Signed)
plz notify 7 days of pills sent in.  Agree with husband helping manage this medication. Let us know how she does with 1 tab at bedtime and we can discuss possibly increasing for AM.

## 2018-04-05 NOTE — Telephone Encounter (Signed)
Mr Umali who will bring POA said pt is short 7 pills for Clonazepam 1 mg with instructions 1/2 - 1 tab daily prn for anxiety. Mr Rybka said he has 6 caregivers and this is the second month he is running short on this med.Mr Rullo has taken control of Clonazepam bottle and said 1/2 tab at hs is not helping pt to rest so giving 1 tab at hs. Mrs Severin told her husband that some days the caregivers had given pt 1/2 - 1 tab in AM and HS. Advised this is not what directions say; Mr Freeman voiced understanding and was also concerned that a caregiver might have taken some of the pills. Mr [facility] request 7 tabs sent to [facility] which will take care of pt taking 1 at hs until gets new rx on 04/13/18. Mr [facility] request cb.

## 2018-04-06 ENCOUNTER — Telehealth: Payer: Self-pay

## 2018-04-06 NOTE — Telephone Encounter (Signed)
Spoke with pt relaying Dr. Synthia Innocent message.  States taking 1 tab at bedtime helps.

## 2018-04-06 NOTE — Telephone Encounter (Signed)
Spoke with pt relaying results per Dr. Darnell Level.  Pt verbalizes understanding.  [See lab, 03/30/18, Pt Result Comments section.[name] [name]]

## 2018-04-06 NOTE — Telephone Encounter (Signed)
Copied from [place] [id number]. Topic: Quick Communication - Lab Results >> Apr 06, 2018 12:01 PM Valla Leaver wrote: Notified patient regarding lab results from 05/22 that Dr. Darnell Level or cma will call her back to review them

## 2018-04-07 DIAGNOSIS — I2510 Atherosclerotic heart disease of native coronary artery without angina pectoris: Secondary | ICD-10-CM | POA: Diagnosis not present

## 2018-04-07 DIAGNOSIS — R262 Difficulty in walking, not elsewhere classified: Secondary | ICD-10-CM | POA: Diagnosis not present

## 2018-04-07 DIAGNOSIS — D519 Vitamin B12 deficiency anemia, unspecified: Secondary | ICD-10-CM | POA: Diagnosis not present

## 2018-04-07 DIAGNOSIS — M6281 Muscle weakness (generalized): Secondary | ICD-10-CM | POA: Diagnosis not present

## 2018-04-07 DIAGNOSIS — G903 Multi-system degeneration of the autonomic nervous system: Secondary | ICD-10-CM | POA: Diagnosis not present

## 2018-04-07 DIAGNOSIS — I10 Essential (primary) hypertension: Secondary | ICD-10-CM | POA: Diagnosis not present

## 2018-04-07 DIAGNOSIS — R251 Tremor, unspecified: Secondary | ICD-10-CM | POA: Diagnosis not present

## 2018-04-13 ENCOUNTER — Encounter (HOSPITAL_COMMUNITY): Payer: Self-pay

## 2018-04-13 ENCOUNTER — Emergency Department (HOSPITAL_COMMUNITY): Payer: Medicare Other

## 2018-04-13 ENCOUNTER — Emergency Department (HOSPITAL_COMMUNITY)
Admission: EM | Admit: 2018-04-13 | Discharge: 2018-04-14 | Disposition: A | Discharge status: Home / Self Care | Payer: Medicare Other | Attending: Emergency Medicine | Admitting: Emergency Medicine

## 2018-04-13 DIAGNOSIS — J45909 Unspecified asthma, uncomplicated: Secondary | ICD-10-CM | POA: Insufficient documentation

## 2018-04-13 DIAGNOSIS — K5900 Constipation, unspecified: Secondary | ICD-10-CM | POA: Diagnosis not present

## 2018-04-13 DIAGNOSIS — R1032 Left lower quadrant pain: Secondary | ICD-10-CM | POA: Diagnosis not present

## 2018-04-13 DIAGNOSIS — Z7982 Long term (current) use of aspirin: Secondary | ICD-10-CM | POA: Diagnosis not present

## 2018-04-13 DIAGNOSIS — E039 Hypothyroidism, unspecified: Secondary | ICD-10-CM | POA: Insufficient documentation

## 2018-04-13 DIAGNOSIS — I2510 Atherosclerotic heart disease of native coronary artery without angina pectoris: Secondary | ICD-10-CM | POA: Insufficient documentation

## 2018-04-13 DIAGNOSIS — K529 Noninfective gastroenteritis and colitis, unspecified: Secondary | ICD-10-CM | POA: Diagnosis not present

## 2018-04-13 DIAGNOSIS — Z79899 Other long term (current) drug therapy: Secondary | ICD-10-CM | POA: Diagnosis not present

## 2018-04-13 DIAGNOSIS — R109 Unspecified abdominal pain: Secondary | ICD-10-CM | POA: Diagnosis not present

## 2018-04-13 DIAGNOSIS — R531 Weakness: Secondary | ICD-10-CM | POA: Diagnosis not present

## 2018-04-13 DIAGNOSIS — R0902 Hypoxemia: Secondary | ICD-10-CM | POA: Diagnosis not present

## 2018-04-13 DIAGNOSIS — R9431 Abnormal electrocardiogram [ECG] [EKG]: Secondary | ICD-10-CM | POA: Diagnosis not present

## 2018-04-13 LAB — BASIC METABOLIC PANEL
Anion gap: 7 (ref 5–15)
BUN: 8 mg/dL (ref 6–20)
CHLORIDE: 105 mmol/L (ref 101–111)
CO2: 29 mmol/L (ref 22–32)
CREATININE: 0.79 mg/dL (ref 0.44–1.00)
Calcium: 9.5 mg/dL (ref 8.9–10.3)
GFR calc Af Amer: >60 mL/min (ref >60)
GFR calc non Af Amer: >60 mL/min (ref >60)
Glucose, Bld: 190 mg/dL — ABNORMAL HIGH (ref 65–99)
Potassium: 4.8 mmol/L (ref 3.5–5.1)
SODIUM: 141 mmol/L (ref 135–145)

## 2018-04-13 LAB — CBC
HCT: 41.1 % (ref 36.0–46.0)
Hemoglobin: 13.2 g/dL (ref 12.0–15.0)
MCH: 29.2 pg (ref 26.0–34.0)
MCHC: 32.1 g/dL (ref 30.0–36.0)
MCV: 90.9 fL (ref 78.0–100.0)
PLATELETS: 261 10*3/uL (ref 150–400)
RBC: 4.52 MIL/uL (ref 3.87–5.11)
RDW: 12.8 % (ref 11.5–15.5)
WBC: 6 10*3/uL (ref 4.0–10.5)

## 2018-04-13 MED ORDER — IOHEXOL 300 MG/ML  SOLN
100.0000 mL | Freq: Once | INTRAMUSCULAR | Status: AC | PRN
  Administered 2018-04-13: 100 mL via INTRAVENOUS

## 2018-04-13 NOTE — ED Notes (Signed)
Patient transported to CT 

## 2018-04-13 NOTE — ED Provider Notes (Signed)
[facility]PART[facility] Provider Note   CSN: [id number] Arrival date & time: 04/13/18  1907     History   Chief Complaint No chief complaint on file.   HPI [name] [name] is a 77 y.o. female.  HPI  77 year old female presents with left-sided abdominal pain.  The history is somewhat limited and taken both from  patient husband.  The patient has a history of multiple system atrophy.  She has had progressive weakness for months/years.  She has a history of neurogenic bladder and often gets UTIs.  She is coming in today because of left-sided abdominal pain.  It seems to come and go but nothing obviously makes it come.  While I am talking to there is no pain.  She feels like there is a lump in her lower abdomen.  The pain is mostly left lower quadrant. No fevers.  She has chronic constipation, last BM 2 days ago.  She had to take meds to help her go to the bathroom.  Past Medical History:  Diagnosis Date  . Asthma   . CAD (coronary artery disease)    myoview 5/08:  EF 76%, no scar, no ischemia, +ECG changes with exercise;  cath 04/08/07:  pLAD 30%, mLAD 60-70% - med tx.  . Chronic cystitis    recurrent UTIs, started bactrim ppx (McDiarmid)  . Depression   . Familial tremor    followed by Dr. Erling Cruz  . GERD (gastroesophageal reflux disease)    s/p nissen  . History of pneumonia   . HLD (hyperlipidemia)   . Hydronephrosis, bilateral   . Hypothyroidism   . Internal hemorrhoids   . Multiple system atrophy [facility])    [facility], now [name]  . Neurogenic bladder    Tannenbaum/MacDiarmid  . Osteoporosis 2016   DEXA T-4.4 spine deteriorated since 2012  . Unspecified chronic bronchitis ([place])   . VAGINITIS, ATROPHIC 11/07/2007    Patient Active Problem List   Diagnosis Date Noted  . Black stool 03/30/2018  . Acute cystitis 03/17/2018  . Chronic insomnia 07/30/2017  . Bone lesion 05/18/2017  . Vitamin D deficiency 05/18/2017  . 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 General weakness 02/26/2016  . Chills 02/26/2016  . Other fatigue 02/04/2016  . Neurogenic bladder   . Chronic cystitis 04/18/2015  . Pedal edema 04/01/2015  . Constipation 12/14/2014  . Medicare annual wellness visit, subsequent 11/27/2014  . Advanced care planning/counseling discussion 11/27/2014  . Osteoporosis   . Vitamin B12 deficiency 06/07/2014  . Recurrent falls 02/06/2014  . Multiple system atrophy ([place])   . Dysphagia 07/28/2013  . Shoulder pain, left 07/20/2012  . 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 Health maintenance examination 08/26/2011  . Murmur 05/27/2011  . HYPERCHOLESTEROLEMIA  IIA 05/14/2010  . VENOUS INSUFFICIENCY, LEGS 06/21/2009  . CAD (coronary artery disease) 06/09/2008  . Hypothyroidism 05/15/2008  . ASTHMA 05/15/2008  . Depression 11/07/2007  . Tremor due to disorder of central nervous system 11/07/2007  . GERD 11/07/2007  . VAGINITIS, ATROPHIC 11/07/2007    Past Surgical History:  Procedure Laterality Date  . COLONOSCOPY N/A 08/29/2013   int hemm; Inda Castle, MD  . CYSTECTOMY    . DEXA  12/2014   T -4.4 spine  . ESOPHAGOGASTRODUODENOSCOPY N/A 01/09/2015   Procedure: ESOPHAGOGASTRODUODENOSCOPY (EGD);  Surgeon: Inda Castle, MD;  Location: [facility];  Service: Endoscopy;  Laterality: N/A;  help with transfers  . LAPAROSCOPIC NISSEN FUNDOPLICATION    . TUBAL  LIGATION    . TUMOR REMOVAL       OB History   None      Home Medications    Prior to Admission medications   Medication Sig Start Date End Date Taking? Authorizing Provider  albuterol (PROVENTIL HFA;VENTOLIN HFA) 108 (90 Base) MCG/ACT inhaler TAKE 2 PUFFS BY MOUTH EVERY 6 HOURS AS NEEDED FOR WHEEZE OR SHORTNESS OF BREATH 12/28/17  Yes Ria Bush, MD  aspirin EC 81 MG tablet Take 1 tablet (81 mg total) by mouth daily. 01/25/18  Yes Ria Bush, MD  bisacodyl (DULCOLAX) 10 MG suppository Place 10 mg rectally as needed for mild constipation  or moderate constipation.   Yes [provider]  clonazePAM (KLONOPIN) 1 MG tablet Take 0.5-1 tablets (0.5-1 mg total) by mouth daily as needed for anxiety. 04/05/18  Yes Ria Bush, MD  docusate sodium (COLACE) 100 MG capsule Take 300 mg by mouth daily.    Yes [provider]  esomeprazole (NEXIUM) 40 MG capsule Take 1 capsule (40 mg total) by mouth daily. 03/04/18  Yes Ria Bush, MD  famotidine (PEPCID) 40 MG tablet TAKE 1 TABLET (40 MG TOTAL) BY MOUTH 2 (TWO) TIMES DAILY AS NEEDED FOR HEARTBURN. 09/23/17  Yes Ria Bush, MD  furosemide (LASIX) 20 MG tablet Take 1 tablet (20 mg total) by mouth daily as needed for fluid or edema. 04/04/15  Yes Ria Bush, MD  hyoscyamine (LEVSIN SL) 0.125 MG SL tablet PLACE 2 TABLETS (0.25 MG TOTAL) UNDER THE TONGUE EVERY 4 (FOUR) HOURS AS NEEDED (SPASMS). 11/05/17  Yes Pyrtle, Lajuan Lines, MD  isosorbide mononitrate (IMDUR) 30 MG 24 hr tablet TAKE 1/2 TABLET BY MOUTH DAILY 04/06/18  Yes [name], MD  MYRBETRIQ 50 MG TB24 tablet Take 50 mg by mouth daily. 04/12/17  Yes [provider]  nitroGLYCERIN (NITROSTAT) 0.4 MG SL tablet Place 1 tablet (0.4 mg total) under the tongue every 5 (five) minutes as needed (chest pain). 08/07/15  Yes [name], MD  propranolol (INDERAL) 10 MG tablet TAKE 1 TABLET BY MOUTH 3 TIMES DAILY AS NEEDED (TREMORS). Patient taking differently: Take 10 mg by mouth in the morning 10/12/17  Yes Ria Bush, MD  rosuvastatin (CRESTOR) 20 MG tablet TAKE 0.5 TABLETS (10 MG TOTAL) BY MOUTH DAILY. Patient taking differently: Take 10 mg by mouth at bedtime.  01/17/18  Yes [name], MD  sertraline (ZOLOFT) 25 MG tablet TAKE 1 TABLET (25 MG TOTAL) BY MOUTH DAILY. 11/11/17  Yes Ria Bush, MD  SYNTHROID 112 MCG tablet TAKE 1 TABLET (112 MCG TOTAL) BY MOUTH DAILY BEFORE BREAKFAST. 03/22/18  Yes Ria Bush, MD  trimethoprim (TRIMPEX) 100 MG tablet Take 100 mg by mouth daily.  02/10/17  Yes [provider]  amoxicillin-clavulanate (AUGMENTIN) 875-125 MG tablet Take 1 tablet by mouth 2 (two) times daily. One po bid x 7 days 04/14/18   Sherwood Gambler, MD    Family History Family History  Problem Relation Age of Onset  . Hypertension Mother   . Coronary artery disease Father   . Heart attack Father   . Heart disease Father   . Diabetes Daughter   . Breast cancer Maternal Aunt   . Coronary artery disease Daughter   . Colon cancer Maternal Aunt   . Multiple sclerosis Daughter   . Stroke Daughter   . Heart attack Daughter     Social History Social History   Tobacco Use  . Smoking status: Never Smoker  . Smokeless tobacco: Never Used  Substance Use Topics  . Alcohol use: No    Alcohol/week: 0.0 oz  . Drug use: No     Allergies   Keflex [cephalexin]   Review of Systems Review of Systems  Constitutional: Negative for fever.  Respiratory: Negative for shortness of breath.   Cardiovascular: Negative for chest pain.  Gastrointestinal: Positive for abdominal pain and constipation. Negative for vomiting.  Genitourinary: Negative for dysuria.  Musculoskeletal: Negative for back pain.  All other systems reviewed and are negative.    Physical Exam Updated Vital Signs BP (!) 149/86   Pulse (!) 106   Temp 97.9 F (36.6 C) (Oral)   Resp 19   SpO2 100%   Physical Exam  Constitutional: She is oriented to person, place, and time. She appears well-developed and well-nourished.  contractures  HENT:  Head: Normocephalic and atraumatic.  Right Ear: External ear normal.  Left Ear: External ear normal.  Nose: Nose normal.  Eyes: Right eye exhibits no discharge. Left eye exhibits no discharge.  Cardiovascular: Normal rate, regular rhythm and normal heart sounds.  Pulmonary/Chest: Effort normal and breath sounds normal.  Abdominal: Soft. She exhibits no distension. There is no tenderness.  Neurological: She is alert and oriented to person,  place, and time.  Skin: Skin is warm and dry.  Nursing note and vitals reviewed.    ED Treatments / Results  Labs (all labs ordered are listed, but only abnormal results are displayed) Labs Reviewed  BASIC METABOLIC PANEL - Abnormal; Notable for the following components:      Result Value   Glucose, Bld 190 (*)    All other components within normal limits  CBC    EKG EKG Interpretation  Date/Time:  Wednesday April 13 2018 19:15:30 EDT Ventricular Rate:  90 PR Interval:  162 QRS Duration: 68 QT Interval:  360 QTC Calculation: 440 R Axis:     Text Interpretation:  Normal sinus rhythm Minimal voltage criteria for LVH, may be normal variant Cannot rule out Anterior infarct , age undetermined Abnormal ECG Confirmed by Sherwood Gambler [phone]) on 04/13/2018 11:07:45 PM   Radiology Ct Abdomen Pelvis W Contrast  Result Date: 04/13/2018 CLINICAL DATA:  Abdominal pain. EXAM: CT ABDOMEN AND PELVIS WITH CONTRAST TECHNIQUE: Multidetector CT imaging of the abdomen and pelvis was performed using the standard protocol following bolus administration of intravenous contrast. CONTRAST:  150m OMNIPAQUE IOHEXOL 300 MG/ML  SOLN COMPARISON:  CT 10/08/2015 FINDINGS: Lower chest: Breathing motion artifact in the lung bases limits assessment. No large pleural effusion. Hepatobiliary: Mild motion artifact through the liver and gallbladder on both portal venous and delayed phase. Allowing for this no acute abnormality or focal hepatic lesion. No calcified gallstone or biliary dilatation. Pancreas: Parenchymal atrophy. No ductal dilatation or inflammation. Spleen: Normal in size without focal abnormality. Small splenule anteriorly. Adrenals/Urinary Tract: Normal adrenal glands. No hydronephrosis or perinephric edema. Homogeneous renal enhancement with symmetric excretion on delayed phase imaging. Small simple cyst in the lower left kidney. Urinary bladder is distended without wall thickening. Bladder volume = 610  cm^3. Stomach/Bowel: Motion artifact and lack of enteric contrast partially limits bowel assessment. Surgical clips at the gastroesophageal junction. No gastric wall thickening. No bowel obstruction. There is colonic tortuosity with short segment mild wall thickening of the splenic flexure and proximal descending colon with faint adjacent pericolonic stranding. No significant diverticular disease. Appendix not confidently visualized. Vascular/Lymphatic: Aortic atherosclerosis without aneurysm. No adenopathy, motion partially limits evaluation of the upper abdomen. Reproductive: Uterus and bilateral adnexa are  unremarkable. Other: No free air, free fluid, or intra-abdominal fluid collection. Musculoskeletal: The bones are under mineralized. There are no acute or suspicious osseous abnormalities. IMPRESSION: 1. Short segment colonic wall thickening with pericolonic edema about the splenic flexure and proximal descending colon, suggesting colitis. This is likely infectious or inflammatory. 2. Bladder distention without wall thickening. 3. Incidental Aortic Atherosclerosis (ICD10-I70.0). Electronically Signed   By: Jeb Levering M.D.   On: 04/13/2018 22:56    Procedures Procedures (including critical care time)  Medications Ordered in ED Medications  iohexol (OMNIPAQUE) 300 MG/ML solution 100 mL (100 mLs Intravenous Contrast Given 04/13/18 2217)     Initial Impression / Assessment and Plan / ED Course  I have reviewed the triage vital signs and the nursing notes.  Pertinent labs & imaging results that were available during my care of the patient were reviewed by me and considered in my medical decision making (see chart for details).     There is no abdominal tenderness on repeat exam.  CT shows possible colitis and given her on and off abdominal pain, I will treat her with antibiotics.  However I think it is probably less likely this is an infectious cause.  There is no obvious mass or hernia.  As  for her progressive weakness this appears to be due to her chronic neurologic condition and I do not think there is any emergent cause today.  Given the vague pain, I discussed adding on LFTs and lipase and urine, however unfortunately the nursing staff has not performed these test over multiple hours.  When discussing further with the patient she would like to go home.  Given the negative CT otherwise, I think finding significant abnormalities on these tests would be less likely anyway.  She just got over a UTI.  Discharged home with return precautions.  Final Clinical Impressions(s) / ED Diagnoses   Final diagnoses:  Left sided abdominal pain  Colitis    ED Discharge Orders        Ordered    amoxicillin-clavulanate (AUGMENTIN) 875-125 MG tablet  2 times daily     04/14/18 0013       Sherwood Gambler, MD 04/14/18 0040

## 2018-04-13 NOTE — ED Triage Notes (Signed)
Pt comes from home via [facility] for weakness over the past few days, and stiffness hx of multi system atrophy.

## 2018-04-13 NOTE — ED Notes (Signed)
Next to go back per Triage RN

## 2018-04-14 MED ORDER — AMOXICILLIN-POT CLAVULANATE 875-125 MG PO TABS: 1.0000 | ORAL_TABLET | Freq: Two times a day (BID) | ORAL | 0 refills | Status: DC

## 2018-04-14 NOTE — ED Notes (Signed)
Patient Alert and oriented to baseline. Stable and ambulatory to baseline. Patient verbalized understanding of the discharge instructions.  Patient belongings were taken by the patient.   

## 2018-04-19 DIAGNOSIS — D519 Vitamin B12 deficiency anemia, unspecified: Secondary | ICD-10-CM | POA: Diagnosis not present

## 2018-04-19 DIAGNOSIS — G903 Multi-system degeneration of the autonomic nervous system: Secondary | ICD-10-CM | POA: Diagnosis not present

## 2018-04-19 DIAGNOSIS — R262 Difficulty in walking, not elsewhere classified: Secondary | ICD-10-CM | POA: Diagnosis not present

## 2018-04-19 DIAGNOSIS — R251 Tremor, unspecified: Secondary | ICD-10-CM | POA: Diagnosis not present

## 2018-04-19 DIAGNOSIS — I10 Essential (primary) hypertension: Secondary | ICD-10-CM | POA: Diagnosis not present

## 2018-04-19 DIAGNOSIS — M6281 Muscle weakness (generalized): Secondary | ICD-10-CM | POA: Diagnosis not present

## 2018-04-19 DIAGNOSIS — I2510 Atherosclerotic heart disease of native coronary artery without angina pectoris: Secondary | ICD-10-CM | POA: Diagnosis not present

## 2018-04-20 ENCOUNTER — Ambulatory Visit: Payer: Medicare Other | Admitting: Physician Assistant

## 2018-04-20 ENCOUNTER — Encounter: Payer: Self-pay | Admitting: Physician Assistant

## 2018-04-20 VITALS — BP 122/76 | HR 72 | Ht 62.0 in

## 2018-04-20 DIAGNOSIS — K5010 Crohn's disease of large intestine without complications: Secondary | ICD-10-CM | POA: Diagnosis not present

## 2018-04-20 DIAGNOSIS — K5902 Outlet dysfunction constipation: Secondary | ICD-10-CM | POA: Diagnosis not present

## 2018-04-20 NOTE — Patient Instructions (Signed)
Use a Dulcolax suppository  Every 2-3 days. Call our office for problems. [phone]- choose option 2 and ask for Amy's nurse.   If you are age 78 or older, your body mass index should be between 23-30. Your Body mass index is 26.52 kg/m. If this is out of the aforementioned range listed, please consider follow up with your Primary Care Provider.

## 2018-04-21 ENCOUNTER — Encounter: Payer: Self-pay | Admitting: Physician Assistant

## 2018-04-21 DIAGNOSIS — R251 Tremor, unspecified: Secondary | ICD-10-CM | POA: Diagnosis not present

## 2018-04-21 DIAGNOSIS — I10 Essential (primary) hypertension: Secondary | ICD-10-CM | POA: Diagnosis not present

## 2018-04-21 DIAGNOSIS — I2510 Atherosclerotic heart disease of native coronary artery without angina pectoris: Secondary | ICD-10-CM | POA: Diagnosis not present

## 2018-04-21 DIAGNOSIS — M6281 Muscle weakness (generalized): Secondary | ICD-10-CM | POA: Diagnosis not present

## 2018-04-21 DIAGNOSIS — D519 Vitamin B12 deficiency anemia, unspecified: Secondary | ICD-10-CM | POA: Diagnosis not present

## 2018-04-21 DIAGNOSIS — R262 Difficulty in walking, not elsewhere classified: Secondary | ICD-10-CM | POA: Diagnosis not present

## 2018-04-21 DIAGNOSIS — G903 Multi-system degeneration of the autonomic nervous system: Secondary | ICD-10-CM | POA: Diagnosis not present

## 2018-04-21 NOTE — Progress Notes (Addendum)
Subjective:    Patient ID: [name] [name], female    DOB: 02/16/1941, 77 y.o.   MRN: [id number]  HPI [name] [name] is a pleasant 77 year old white female, known to Dr. Hilarie Fredrickson he was last seen in the office about a year ago.  She has history of IBS, constipation, GERD, and unfortunately has a progressive multisystem atrophy disorder and is currently wheelchair-bound with progressive weakness, neurogenic bladder.  Her husband now has caregivers coming into the home to assist with her care. She had undergone colonoscopy in October 2014 with finding of internal hemorrhoids otherwise negative/Dr. Deatra Ina. EGD in 2016 was normal, as well as gastric emptying scan. Most recently CT of the abdomen and pelvis was done on 04/13/2018 at the time of the ER visit with complaints of abdominal pain and dark stool.  This showed a short segment colonic wall thickening with pericolonic edema from the splenic flexure to the proximal descending colon consistent with a colitis. Patient comes in today for follow-up.  Her husband is very concerned because he feels her bowel habits have been very irregular over the past year, she will have bouts of loose stools followed by constipation.  About 3 weeks ago she saw a very dark bowel movement and says this persisted only for 1 day has not recurred.  She had not been taking any Pepto-Bismol or iron. She says she had been hurting on her left side over the past 4 to 6 weeks, and this had increased when they went to the emergency room..  Patient had also been apparently concerned about a bulge or mass in her left abdomen. She was treated with a course of Augmentin which she is completing today. Labs from 04/13/2018 were reviewed and unremarkable, hemoglobin 13.2.  Patient says she is feeling better and is not having any significant pain on her left side currently, she never had any diarrhea to speak of, and again has not seen any further dark stools.Marland Kitchen  Her husband feels that she has relaxed  since being told that she does not have any mass in her lower abdomen.  Review of Systems Pertinent positive and negative review of systems were noted in the above HPI section.  All other review of systems was otherwise negative.  Outpatient Encounter Medications as of 04/20/2018  Medication Sig  . albuterol (PROVENTIL HFA;VENTOLIN HFA) 108 (90 Base) MCG/ACT inhaler TAKE 2 PUFFS BY MOUTH EVERY 6 HOURS AS NEEDED FOR WHEEZE OR SHORTNESS OF BREATH  . amoxicillin-clavulanate (AUGMENTIN) 875-125 MG tablet Take 1 tablet by mouth 2 (two) times daily. One po bid x 7 days  . aspirin EC 81 MG tablet Take 1 tablet (81 mg total) by mouth daily.  . bisacodyl (DULCOLAX) 10 MG suppository Place 10 mg rectally as needed for mild constipation or moderate constipation.  . clonazePAM (KLONOPIN) 1 MG tablet Take 0.5-1 tablets (0.5-1 mg total) by mouth daily as needed for anxiety.  . docusate sodium (COLACE) 100 MG capsule Take 300 mg by mouth daily.   Marland Kitchen esomeprazole (NEXIUM) 40 MG capsule Take 1 capsule (40 mg total) by mouth daily.  . famotidine (PEPCID) 40 MG tablet TAKE 1 TABLET (40 MG TOTAL) BY MOUTH 2 (TWO) TIMES DAILY AS NEEDED FOR HEARTBURN.  . furosemide (LASIX) 20 MG tablet Take 1 tablet (20 mg total) by mouth daily as needed for fluid or edema.  . hyoscyamine (LEVSIN SL) 0.125 MG SL tablet PLACE 2 TABLETS (0.25 MG TOTAL) UNDER THE TONGUE EVERY 4 (FOUR) HOURS AS NEEDED (SPASMS).  Marland Kitchen  isosorbide mononitrate (IMDUR) 30 MG 24 hr tablet TAKE 1/2 TABLET BY MOUTH DAILY  . MYRBETRIQ 50 MG TB24 tablet Take 50 mg by mouth daily.  . nitroGLYCERIN (NITROSTAT) 0.4 MG SL tablet Place 1 tablet (0.4 mg total) under the tongue every 5 (five) minutes as needed (chest pain).  . propranolol (INDERAL) 10 MG tablet TAKE 1 TABLET BY MOUTH 3 TIMES DAILY AS NEEDED (TREMORS). (Patient taking differently: Take 10 mg by mouth in the morning)  . rosuvastatin (CRESTOR) 20 MG tablet TAKE 0.5 TABLETS (10 MG TOTAL) BY MOUTH DAILY. (Patient  taking differently: Take 10 mg by mouth at bedtime. )  . sertraline (ZOLOFT) 25 MG tablet TAKE 1 TABLET (25 MG TOTAL) BY MOUTH DAILY.  . SYNTHROID 112 MCG tablet TAKE 1 TABLET (112 MCG TOTAL) BY MOUTH DAILY BEFORE BREAKFAST.  Marland Kitchen trimethoprim (TRIMPEX) 100 MG tablet Take 100 mg by mouth daily.   No facility-administered encounter medications on file as of 04/20/2018.    Allergies  Allergen Reactions  . Keflex [Cephalexin] Diarrhea   Patient Active Problem List   Diagnosis Date Noted  . Black stool 03/30/2018  . Acute cystitis 03/17/2018  . Chronic insomnia 07/30/2017  . Bone lesion 05/18/2017  . Vitamin D deficiency 05/18/2017  . Osteoarthritis of cervical spine 10/06/2016  . General weakness 02/26/2016  . Chills 02/26/2016  . Other fatigue 02/04/2016  . Neurogenic bladder   . Chronic cystitis 04/18/2015  . Pedal edema 04/01/2015  . Constipation 12/14/2014  . Medicare annual wellness visit, subsequent 11/27/2014  . Advanced care planning/counseling discussion 11/27/2014  . Osteoporosis   . Vitamin B12 deficiency 06/07/2014  . Recurrent falls 02/06/2014  . Multiple system atrophy ([place])   . Dysphagia 07/28/2013  . Shoulder pain, left 07/20/2012  . 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 Health maintenance examination 08/26/2011  . Murmur 05/27/2011  . HYPERCHOLESTEROLEMIA  IIA 05/14/2010  . VENOUS INSUFFICIENCY, LEGS 06/21/2009  . CAD (coronary artery disease) 06/09/2008  . Hypothyroidism 05/15/2008  . ASTHMA 05/15/2008  . Depression 11/07/2007  . Tremor due to disorder of central nervous system 11/07/2007  . GERD 11/07/2007  . VAGINITIS, ATROPHIC 11/07/2007   Social History   Socioeconomic History  . Marital status: Married    Spouse name: [name]  . Number of children: 2  . Years of education: HS  . Highest education level: Not on file  Occupational History    Employer: OTHER  Social Needs  . Financial resource strain: Not on file  . Food insecurity:    Worry:  Not on file    Inability: Not on file  . Transportation needs:    Medical: Not on file    Non-medical: Not on file  Tobacco Use  . Smoking status: Never Smoker  . Smokeless tobacco: Never Used  Substance and Sexual Activity  . Alcohol use: No    Alcohol/week: 0.0 oz  . Drug use: No  . Sexual activity: Not Currently  Lifestyle  . Physical activity:    Days per week: Not on file    Minutes per session: Not on file  . Stress: Not on file  Relationships  . Social connections:    Talks on phone: Not on file    Gets together: Not on file    Attends religious service: Not on file    Active member of club or organization: Not on file    Attends meetings of clubs or organizations: Not on file    Relationship status: Not on file  .  Intimate partner violence:    Fear of current or ex partner: Not on file    Emotionally abused: Not on file    Physically abused: Not on file    Forced sexual activity: Not on file  Other Topics Concern  . Not on file  Social History Narrative   HSG, retired from [employer] . married '60. 1 son - '65; 1 daughter - '61; 2 grandchildren; 1 great-grandchild. Homemaker. marriage in good health. 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 Positive history of passive tobacco smoke exposure.   Caffeine Use: 1 cup daily; 2 glasses of tea daily    Ms. Silsby's family history includes Breast cancer in her maternal aunt; Colon cancer in her maternal aunt; Coronary artery disease in her daughter and father; Diabetes in her daughter; Heart attack in her daughter and father; Heart disease in her father; Hypertension in her mother; Multiple sclerosis in her daughter; Stroke in her daughter.      Objective:    Vitals:   04/20/18 1111  BP: 122/76  Pulse: 72    Physical Exam; well-developed chronically ill-appearing elderly white female wheelchair-bound, accompanied by her husband.  Patient very pleasant blood pressure 122/76 pulse 72, height 5 foot 2, BMI 26.5.  HEENT  ;nontraumatic normocephalic EOMI PERRLA sclera anicteric, Oropharynx clear, Cardiovascular ;regular rate and rhythm with S1-S2, Pulmonary clear bilaterally, Abdomen ;soft, no palpable mass or hepatosplenomegaly bowel sounds are present she is nontender.  Rectal; exam external hemorrhoidal tags, noninflamed, digital exam negative, no impaction no mass felt, stool heme-negative.  Extremities ;significant decreased range of motion, patient unable to participate in positioning herself and cannot stand unassisted very She required total lift to get her on the exam table.  Neuro psych ;alert and oriented, grossly nonfocal mood and affect appropriate       Assessment & Plan:   #85 77 year old white female, nonambulatory with progressive multisystem atrophy who comes in after recent bout of left-sided abdominal discomfort, and dark stool. CT imaging on 04/13/2018 showed short segment colonic wall thickening and pericolonic edema from the splenic flexure to the proximal descending colon consistent with an acute colitis. Patient is improved, and not having any ongoing left-sided abdominal pain. Stool was Hemoccult negative today.   I do not think she has any chronic colitis and may have had a segmental ischemic colitis or mild infectious colitis.  #2 progressive difficulty evacuating bowels-secondary to progressive weakness from multisystem atrophy #3 alternating bowel habits #4 GERD  Plan; Long discussion with patient and her husband today regarding CT imaging and segmental colitis. They are reassured that she is not having any active bleeding. Patient was also reassured that she has been evaluated and there is no evidence of mass in her left abdomen.   She will continue to use a stool softener on a daily basis, and suggested she may benefit from using a Dulcolax suppository at least every other day to stimulate evacuation of her lower bowel which will be an ongoing issue with her progressive muscular  weakness. Marland Kitchen  She will follow-up with Dr. Hilarie Fredrickson or myself on an as-needed basis.  Patient advised to call for any recurrent problems with pain or dark stools.   Genia Harold PA-C 04/21/2018   Cc: Ria Bush, MD  Addendum: Reviewed and agree with management. Pyrtle, Lajuan Lines, MD

## 2018-04-25 ENCOUNTER — Telehealth (INDEPENDENT_AMBULATORY_CARE_PROVIDER_SITE_OTHER): Payer: Medicare Other

## 2018-04-25 DIAGNOSIS — R300 Dysuria: Secondary | ICD-10-CM

## 2018-04-25 LAB — POC URINALSYSI DIPSTICK (AUTOMATED)
Bilirubin, UA: NEGATIVE
GLUCOSE UA: NEGATIVE
Ketones, UA: NEGATIVE
Leukocytes, UA: NEGATIVE
Nitrite, UA: NEGATIVE
PROTEIN UA: NEGATIVE
RBC UA: NEGATIVE
SPEC GRAV UA: 1.01 (ref 1.010–1.025)
UROBILINOGEN UA: 0.2 U/dL
pH, UA: 6 (ref 5.0–8.0)

## 2018-04-25 NOTE — Addendum Note (Signed)
Addended by: Brenton Grills on: 12/11/6689 67:56 PM   Modules accepted: Orders

## 2018-04-25 NOTE — Telephone Encounter (Signed)
Spoke with pt's husband, Dellis Filbert, relaying Dr. Synthia Innocent message.  States pt only c/o burning with urination.  He will pick up urine cup today.

## 2018-04-25 NOTE — Telephone Encounter (Signed)
Ok to pick up sterile container to bring in urine.  What other symptoms is patient having?  Dysuria, urgency, frequency, abd pain, fevers, nausea/vomiting?

## 2018-04-25 NOTE — Telephone Encounter (Signed)
Copied from [place] [id number]. Topic: Quick Communication - See Telephone Encounter >> Apr 25, 2018  9:03 AM Nils Flack wrote: CRM for notification. See Telephone encounter for: 04/25/18. Husband wants to know if he can pick up specimen container and bring back.  Pt has blood in her urine. Husband declined to make appt, he says that it is an ordeal to get her into the office due to pt being in a wheel chair.  Pt has upcoming appt on 05/02/18, but doesn't want to wait that long.  Cb is [phone]

## 2018-04-25 NOTE — Telephone Encounter (Signed)
Spoke with pt's husband, Dellis Filbert, relaying results per Dr. Lorra Hals states he saw blood in pt's diaper.  However, says pt has had diarrhea and has hemorrhoids so they are not sure if the blood was in the urine.

## 2018-04-25 NOTE — Telephone Encounter (Signed)
plz notify urinalysis returned normal - no blood, no infection at this time.  What did they see - any clots in urine? Had they just eaten beets?

## 2018-04-25 NOTE — Telephone Encounter (Addendum)
UA was run on specimen and values entered.

## 2018-04-26 DIAGNOSIS — I2510 Atherosclerotic heart disease of native coronary artery without angina pectoris: Secondary | ICD-10-CM | POA: Diagnosis not present

## 2018-04-26 DIAGNOSIS — G903 Multi-system degeneration of the autonomic nervous system: Secondary | ICD-10-CM | POA: Diagnosis not present

## 2018-04-26 DIAGNOSIS — R262 Difficulty in walking, not elsewhere classified: Secondary | ICD-10-CM | POA: Diagnosis not present

## 2018-04-26 DIAGNOSIS — I10 Essential (primary) hypertension: Secondary | ICD-10-CM | POA: Diagnosis not present

## 2018-04-26 DIAGNOSIS — D519 Vitamin B12 deficiency anemia, unspecified: Secondary | ICD-10-CM | POA: Diagnosis not present

## 2018-04-26 DIAGNOSIS — R251 Tremor, unspecified: Secondary | ICD-10-CM | POA: Diagnosis not present

## 2018-04-26 DIAGNOSIS — M6281 Muscle weakness (generalized): Secondary | ICD-10-CM | POA: Diagnosis not present

## 2018-04-28 ENCOUNTER — Other Ambulatory Visit: Payer: Self-pay | Admitting: Family Medicine

## 2018-04-28 ENCOUNTER — Ambulatory Visit: Payer: Medicare Other

## 2018-04-28 DIAGNOSIS — E538 Deficiency of other specified B group vitamins: Secondary | ICD-10-CM

## 2018-04-28 DIAGNOSIS — E7800 Pure hypercholesterolemia, unspecified: Secondary | ICD-10-CM

## 2018-04-28 DIAGNOSIS — E039 Hypothyroidism, unspecified: Secondary | ICD-10-CM

## 2018-04-28 DIAGNOSIS — E559 Vitamin D deficiency, unspecified: Secondary | ICD-10-CM

## 2018-05-02 ENCOUNTER — Encounter: Payer: Medicare Other | Admitting: Family Medicine

## 2018-05-02 ENCOUNTER — Telehealth: Payer: Self-pay | Admitting: Family Medicine

## 2018-05-02 NOTE — Telephone Encounter (Signed)
Patient did not have complete resolution of her LLQ pain while on the antibiotics. She was seen first in the ER, then followed up in an office visit here. Spoke with the patient's husband. She is having every other day bowel movements. Takes daily stool softener. No blood or dark stools. Afebrile. No nausea or vomiting.  Concern is that she continues to complain of LLQ pain or "side pain." He questions if she needs to extend the time on antibiotics. He does think there was a little improvement of her pain while on atb. Even though it was not completely resolved. Please advise.

## 2018-05-02 NOTE — Telephone Encounter (Signed)
I recommend that we repeat the CT scan to re-evaluate the colitis seen early this month. She was treated for diverticulitis, but there we do no know if this was diverticulitis, segmental colitis, or infectious.  If this has failed to resolve by repeat CT then further diagnostic test (such as colonoscopy) or medication may be recommended.

## 2018-05-02 NOTE — Telephone Encounter (Signed)
Pt's husband Dellis Filbert would like to speak with Amy regarding antibiotics that pt is taking.

## 2018-05-02 NOTE — Telephone Encounter (Signed)
Pt calling again, pls call him

## 2018-05-03 ENCOUNTER — Other Ambulatory Visit: Payer: Self-pay

## 2018-05-03 DIAGNOSIS — M6281 Muscle weakness (generalized): Secondary | ICD-10-CM | POA: Diagnosis not present

## 2018-05-03 DIAGNOSIS — R262 Difficulty in walking, not elsewhere classified: Secondary | ICD-10-CM | POA: Diagnosis not present

## 2018-05-03 DIAGNOSIS — R251 Tremor, unspecified: Secondary | ICD-10-CM | POA: Diagnosis not present

## 2018-05-03 DIAGNOSIS — D519 Vitamin B12 deficiency anemia, unspecified: Secondary | ICD-10-CM | POA: Diagnosis not present

## 2018-05-03 DIAGNOSIS — G903 Multi-system degeneration of the autonomic nervous system: Secondary | ICD-10-CM | POA: Diagnosis not present

## 2018-05-03 DIAGNOSIS — I2510 Atherosclerotic heart disease of native coronary artery without angina pectoris: Secondary | ICD-10-CM | POA: Diagnosis not present

## 2018-05-03 DIAGNOSIS — I10 Essential (primary) hypertension: Secondary | ICD-10-CM | POA: Diagnosis not present

## 2018-05-03 DIAGNOSIS — R109 Unspecified abdominal pain: Secondary | ICD-10-CM

## 2018-05-03 NOTE — Telephone Encounter (Signed)
Spoke with patient and her husband.  She is scheduled for a CT abd/pelvis with contrast on 05/11/18 at 4:00 pm at [facility].  Husband to pick up contrast tomorrow.  NPO after noon on that day; first bottle of contrast at 2:00 pm and second bottle at 3:00 pm; arrive at [facility] at 3:45 pm.

## 2018-05-03 NOTE — Telephone Encounter (Signed)
Please proceed with Dr Vena Rua reccomendation

## 2018-05-04 DIAGNOSIS — N3020 Other chronic cystitis without hematuria: Secondary | ICD-10-CM | POA: Diagnosis not present

## 2018-05-05 DIAGNOSIS — I2510 Atherosclerotic heart disease of native coronary artery without angina pectoris: Secondary | ICD-10-CM | POA: Diagnosis not present

## 2018-05-05 DIAGNOSIS — R262 Difficulty in walking, not elsewhere classified: Secondary | ICD-10-CM | POA: Diagnosis not present

## 2018-05-05 DIAGNOSIS — D519 Vitamin B12 deficiency anemia, unspecified: Secondary | ICD-10-CM | POA: Diagnosis not present

## 2018-05-05 DIAGNOSIS — I10 Essential (primary) hypertension: Secondary | ICD-10-CM | POA: Diagnosis not present

## 2018-05-05 DIAGNOSIS — G903 Multi-system degeneration of the autonomic nervous system: Secondary | ICD-10-CM | POA: Diagnosis not present

## 2018-05-05 DIAGNOSIS — R251 Tremor, unspecified: Secondary | ICD-10-CM | POA: Diagnosis not present

## 2018-05-05 DIAGNOSIS — M6281 Muscle weakness (generalized): Secondary | ICD-10-CM | POA: Diagnosis not present

## 2018-05-06 ENCOUNTER — Telehealth: Payer: Self-pay

## 2018-05-06 DIAGNOSIS — G903 Multi-system degeneration of the autonomic nervous system: Secondary | ICD-10-CM

## 2018-05-06 DIAGNOSIS — G239 Degenerative disease of basal ganglia, unspecified: Principal | ICD-10-CM

## 2018-05-06 NOTE — Telephone Encounter (Signed)
Denise from [facility] called and states patient's husband reached out to her in regards to the patient and starting Palliative Care. Langley Gauss wanted to check with Dr Danise Mina to make sure he is in agreement. If so she needs a referral sent to her to get care started. [facility]: [phone] and Fax: [phone]. -Kris Mouton, RMA

## 2018-05-06 NOTE — Telephone Encounter (Signed)
That is fine to do. Thank you.

## 2018-05-09 NOTE — Telephone Encounter (Signed)
Paper Palliative referral form filled out and faxed to [facility].

## 2018-05-11 ENCOUNTER — Ambulatory Visit (HOSPITAL_COMMUNITY)
Admission: RE | Admit: 2018-05-11 | Discharge: 2018-05-11 | Disposition: A | Discharge status: Home / Self Care | Payer: Medicare Other | Source: Ambulatory Visit | Attending: Internal Medicine | Admitting: Internal Medicine

## 2018-05-11 DIAGNOSIS — R109 Unspecified abdominal pain: Secondary | ICD-10-CM | POA: Insufficient documentation

## 2018-05-11 MED ORDER — IOHEXOL 300 MG/ML  SOLN
100.0000 mL | Freq: Once | INTRAMUSCULAR | Status: AC | PRN
  Administered 2018-05-11: 100 mL via INTRAVENOUS

## 2018-05-12 ENCOUNTER — Other Ambulatory Visit: Payer: Self-pay | Admitting: Family Medicine

## 2018-05-13 NOTE — Telephone Encounter (Signed)
Eprescribed.

## 2018-05-13 NOTE — Telephone Encounter (Signed)
Name of Medication: Clonazepam Name of Pharmacy:  [facility] Last Fill or Written Date and Quantity: 04/11/18, #30 Last Office Visit and Type: 03/30/18, acute Next Office Visit and Type: 06/13/18, CPE Last Controlled Substance Agreement Date: none Last UDS: none

## 2018-05-16 ENCOUNTER — Telehealth: Payer: Self-pay | Admitting: Internal Medicine

## 2018-05-16 ENCOUNTER — Telehealth: Payer: Self-pay | Admitting: Physician Assistant

## 2018-05-16 NOTE — Telephone Encounter (Signed)
CT imaging results are in Epic. Spoke with the spouse who says she was complaining of her side hurting again this morning. He is aware there is resolution of the colitis.

## 2018-05-16 NOTE — Telephone Encounter (Signed)
Error

## 2018-05-18 ENCOUNTER — Other Ambulatory Visit: Payer: Self-pay

## 2018-05-18 ENCOUNTER — Telehealth: Payer: Self-pay | Admitting: Family Medicine

## 2018-05-18 DIAGNOSIS — R296 Repeated falls: Secondary | ICD-10-CM

## 2018-05-18 DIAGNOSIS — G239 Degenerative disease of basal ganglia, unspecified: Principal | ICD-10-CM

## 2018-05-18 DIAGNOSIS — G903 Multi-system degeneration of the autonomic nervous system: Secondary | ICD-10-CM

## 2018-05-18 DIAGNOSIS — R531 Weakness: Secondary | ICD-10-CM

## 2018-05-18 MED ORDER — HYOSCYAMINE SULFATE 0.125 MG SL SUBL: SUBLINGUAL_TABLET | SUBLINGUAL | 0 refills | Status: DC

## 2018-05-18 NOTE — Telephone Encounter (Signed)
Copied from [place] [id number]. Topic: Quick Communication - See Telephone Encounter >> May 18, 2018 10:15 AM Ivar Drape wrote: CRM for notification. See Telephone encounter for: 05/18/18. Patient's spouse stated he needs a order for more Physical Therapy with [facility] ph: [phone].  Patient will be eligible for another round starting 05/30/18.  Please call him on his cellphone [phone].

## 2018-05-18 NOTE — Telephone Encounter (Signed)
Discussed with Spouse. [facility] [place]. Patient told him that this morning she used a bisacodyl suppository, had a good bowel movement and she is feeling better.

## 2018-05-18 NOTE — Telephone Encounter (Signed)
Unable to reach pts husband (do not see DPR) to verify who needs PT order since it said pts spouse stated he needs order. But then next line says pt will be eligible. So probably typing error.

## 2018-05-18 NOTE — Telephone Encounter (Signed)
She may be having some cramping /spasm periodically of colon - could let her try Lennie Odor sl  Bid prn for the side pain- send rx  # 30/0 Hopefully will help her

## 2018-05-18 NOTE — Telephone Encounter (Signed)
Referral placed.

## 2018-05-19 NOTE — Telephone Encounter (Signed)
New HHPT order faxed to [facility] for [name] [name]. Requested Start of Care date for 05/30/18 so [facility] there Therapist could restart the Home PT. Patients husband notified.

## 2018-05-23 ENCOUNTER — Ambulatory Visit: Payer: Medicare Other | Admitting: Neurology

## 2018-05-23 DIAGNOSIS — G239 Degenerative disease of basal ganglia, unspecified: Secondary | ICD-10-CM | POA: Diagnosis not present

## 2018-05-23 DIAGNOSIS — G20 Parkinson's disease: Secondary | ICD-10-CM | POA: Diagnosis not present

## 2018-05-23 DIAGNOSIS — G903 Multi-system degeneration of the autonomic nervous system: Secondary | ICD-10-CM

## 2018-05-23 NOTE — Progress Notes (Signed)
Subjective:    Patient ID: [name] [name] is a 77 y.o. female.  HPI     Interim history:   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 She is accompanied by her husband again today. I last saw her on 01/04/2018, at which time she felt that her leaning was worse on the left. She had left-sided neck pain for which she was using Aspercreme. She lost her mom at the age of 54 in December 2018. I reordered home health physical therapy.  Today, 05/23/2018: She reports feeling better with the abdominal pain, constipation is still an ongoing issue, uses stool softner, laxative and suppository about 2 times per week. Posture and speech are weaker. Hard time sleeping, due to discomfort, using clonazepam per PCP. She went to the emergency room recently last month for abdominal pain. She was treated with Augmentin. She was seen in follow-up by GI shortly thereafter. Her primary care has referred her back to home health therapy, palliative care is coming out tomorrow as well. He is asking about CBD oil. I explained to them that I cannot promoted as there is no proof that it is effective for her condition and is not controlled by the FDA.   The patient's allergies, current medications, family history, past medical history, past social history, past surgical history and problem list were reviewed and updated as appropriate.    Previously (copied from previous notes for reference):   I saw her on 04/19/2017, at which time we talked about fall prevention and her increase and fall risk. She had issues with her sleep and  was encouraged to try melatonin for this. She was encouraged to seek a second opinion at [facility] or [facility] or locally here in [place] which they declined at the time. She had some help at the house. I referred her to home health as well. She had ER visits in June 2018 as well as April 2018.   I saw her on 10/19/2016, at which time she reported stress d/t her husband's condition. He needed surgery, and had to be in the hospital for about 2 weeks. They had some help at the house, from 9-4 and from 4 to 10 PM. Son lives with them, their daughter passed away three years ago. She had tried baclofen on a few occasions but was not convinced it was helping the episodes of spasms and feelings of chills.     I saw her on 04/28/2016, at which time she reported having had chills, felt cold all the time, thyroid function was checked by PCP about 3 months prior. She was not sleeping very well. Her husband was having a difficult time caring for her. He had lost weight and reported more stress. She was checked by cardiology and was stable from a cardiac standpoint. She had received a B12 shot recently through PCP. She went to the emergency room on 03/17/2016 for uncontrollable chills. For muscle spasms I suggested she try low-dose baclofen.     I saw her on 09/18/2015, at which time she reported muscle spasms in various places. They did not feel like cramps. She was in the interim seen by [name], nurse practitioner,  on 01/22/2016.    I saw her on 07/29/2015, at which time she reported doing fairly well. No choking, no recent falls and he her husband was helping her with mobilizing with a walker. She had to go to the emergency room on 04/11/2015 for UTI. She was in outpatient physical therapy. She was seeing her urologist regularly and they had talked about Botox injections and potentially a surgically placed catheter. She had no headaches. She had a follow-up phone call from the [facility] about a potential research  study but she did not get encouragement to pursue it from the doctor. In the interim, she calls in October 2016 reporting a 2-4 day history of increased muscle spasms. She felt that she had spasms all over. She was encouraged to see her primary care provider. She was seen and had blood work which showed no abnormality of her electrolytes, kidney function. Her magnesium level was normal.     I saw her on 03/27/2015, at which time she reported new left hand numbness for the past 1 month. She woke up with left hand symptoms. She had some numbness and tingling. Her husband noted that she was leaning to the left. Her last brain MRI was on 07/30/2015 which showed no acute abnormality. I reviewed the MRI last time. She denied any slurring of speech, droopy face, lower extremity weakness. She had some lower extremity swelling which was worse. Her left hand numbness improved after a few days. Her leaning improved. She had finished physical therapy in April 2016 and felt it was helpful and wondered if she could restart it. I referred her back to physical therapy outpatient. I suggested we proceed with a brain MRI. She had a brain MRI without contrast on 04/23/2015 and we called her subsequently with her test results: Abnormal MRI brain (without) demonstrating:   1. Small left fronto-parietal, parasagittal focus of encephalomalacia and gliosis, possible a chronic ischemic infarction.   2. Few scattered periventricular foci of non-specific gliosis. 3. No acute findings. 4. No significant change from MRI on 07/29/12.   In addition, I personally reviewed the images through the PACS system.   I saw her on 12/11/2014, at which time she reported feeling about the same as far as her motor symptoms. She had more freezing, especially in her right leg. She was in physical therapy and felt it was helpful. She was trying to do stretching at home. Her husband reported that he does not feel comfortable leaving her at home alone.  They were in touch with [facility] over the phone but there were no updates on research for multiple system atrophy. She has a call alert button. She had no recent falls. She was having trouble going to sleep. She was sleeping in her lift chair. She had new reading glasses. She had diarrhea in the past when she tried Linzess through her GI doctor. She requested a sooner than scheduled appointment due to abnormal posture and left-sided numbness.   I saw her on 07/05/2014, at which time she reported having had more falls. She had not been seen at the [facility] clinic. Stem cell trial was discussed and another trial in [place] with alpha-synuclein depleting therapy at the last visit there. She was eating well and her spirits were good. We restarted outpatient physical therapy and she has been going to the neuro rehabilitation unit.    I saw her on 02/02/2014, at which time she reported that physical therapy was helpful. She had fallen a few  times and bruised her ribs. She had a colonoscopy on 08/29/2013. She reported a history of hemorrhoids and chronic constipation for which she had tried Dulcolax. I referred her back to physical therapy. 4 chronic constipation I asked her to try Linzess. I did not suggest any other new medications. She goes to the [facility] once a year and had an appointment in on 04/27/14 and I reviewed the note and shared the data with them. She had previously tried and failed Sinemet.    I saw her on 09/05/2013, at which time she presented for a sooner than scheduled appointment due to recurrent falls. She had fallen 6 times within about 1-1/2 weeks' time. 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 I advised her to use her walker at all times. We talked about potentially going to a motorized wheelchair down the [place]. She was advised to drink more water. She had tried and failed  Sinemet in the past. I suggested a second opinion at [facility] which they declined. I referred her to physical and occupational therapy outpatient. In the interim, I prescribed a light weight wheelchair for her. Her daughter recently died. She had advanced MS.   I first met her on 05/10/2013, at which time we discussed the possibility of MSA. I did not make any medication changes. We talked about the nature of progressive parkinsonism and gait disorder. She had her last outpatient PT in July 2014. She tried to do stretching at home. She had a colonoscopy.   She previously followed with Dr. Morene Antu and was last seen by him on 12/30/2012, at which time he felt that she had worsening of her gait. She also had symptoms of vertigo. She has been on baby aspirin, Synthroid, Crestor, and/or, clonazepam, propranolol, sertraline, trimethoprim, Nasonex, Provera, nitroglycerin as needed, vitamin B12.   She was diagnosed about 20 years ago with essential tremor. She had head tremor more than upper extremity tremor. She has a history of lung disease but was able to take low-dose propranolol. She has not tried primidone. She has had gait and balance problems in the last 5+ years approximately. She has to hold onto something. She has had bladder incontinence since her 68s. She was on Crestor for 3 years which was discontinued in June 2011. CK was normal and EMG nerve conduction studies were normal in June 2011. Crestor was restarted. She has been on B12 injections without subjective improvement. MRI brain with and without contrast showed hyperostosis frontalis, MRI C-spine showed mild degenerative joint disease. 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 Lumbar spine MRI from July 2012 showed mild degenerative disc disease. 4 gait dysfunction she was referred to Dr. Gilford Rile with a question of myelopathy versus orthostatic tremor versus lower half parkinsonism versus essential  tremor with gait disorder. 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 Further testing revealed negative tilt table test but there was mild autonomic neuropathy per autonomic reflex screen. She has had swelling in both legs with negative Doppler of the legs and normal echocardiogram. Additional workup at [facility] included vitamin D, paraneoplastic antibodies, anti-GAD, all negative.   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 She has been using a 2 wheeled walker for the past 2 years.   She has been in PT and OT with improvement.   She endorsed a lot of stress, due primarily to her mother's and daughter's health, who died in 07-Feb-2014. Mother is in her 77s and in a NH. She sleeps in a recliner, d/t pain in her trunk, back, neck and stiffness and difficulty getting in and out of bed.  His Past Medical History Is Significant For: Past Medical History:  Diagnosis Date  . Asthma   . CAD (coronary artery disease)    myoview 5/08:  EF 76%, no scar, no ischemia, +ECG changes with exercise;  cath 04/08/07:  pLAD 30%, mLAD 60-70% - med tx.  . Chronic cystitis    recurrent UTIs, started bactrim ppx (McDiarmid)  . Depression   . Familial tremor    followed by Dr. Erling Cruz  . GERD (gastroesophageal reflux disease)    s/p nissen  . History of pneumonia   . HLD (hyperlipidemia)   . Hydronephrosis, bilateral   . Hypothyroidism   . Internal hemorrhoids   . 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 Neurogenic bladder    Tannenbaum/MacDiarmid  . Osteoporosis 2016   DEXA T-4.4 spine deteriorated since 2012  . Unspecified chronic bronchitis ([place])   . VAGINITIS, ATROPHIC 11/07/2007    His Past Surgical History Is Significant For: Past  Surgical History:  Procedure Laterality Date  . COLONOSCOPY N/A 08/29/2013   int hemm; Inda Castle, MD  . CYSTECTOMY    . DEXA  12/2014   T -4.4 spine  . ESOPHAGOGASTRODUODENOSCOPY N/A 01/09/2015   Procedure: ESOPHAGOGASTRODUODENOSCOPY (EGD);  Surgeon: Inda Castle, MD;  Location: [facility];  Service: Endoscopy;  Laterality: N/A;  help with transfers  . LAPAROSCOPIC NISSEN FUNDOPLICATION    . TUBAL LIGATION    . TUMOR REMOVAL      His Family History Is Significant For: Family History  Problem Relation Age of Onset  . Hypertension Mother   . Coronary artery disease Father   . Heart attack Father   . Heart disease Father   . Diabetes Daughter   . Breast cancer Maternal Aunt   . Coronary artery disease Daughter   . Colon cancer Maternal Aunt   . Multiple sclerosis Daughter   . Stroke Daughter   . Heart attack Daughter     His Social History Is Significant For: Social History   Socioeconomic History  . Marital status: Married    Spouse name: [name]  . Number of children: 2  . Years of education: HS  . Highest education level: Not on file  Occupational History    Employer: OTHER  Social Needs  . Financial resource strain: Not on file  . Food insecurity:    Worry: Not on file    Inability: Not on file  . Transportation needs:    Medical: Not on file    Non-medical: Not on file  Tobacco Use  . Smoking status: Never Smoker  . Smokeless tobacco: Never Used  Substance and Sexual Activity  . Alcohol use: No    Alcohol/week: 0.0 oz  . Drug use: No  . Sexual activity: Not Currently  Lifestyle  . Physical activity:    Days per week: Not on file    Minutes per session: Not on file  .  Stress: Not on file  Relationships  . Social connections:    Talks on phone: Not on file    Gets together: Not on file    Attends religious service: Not on file    Active member of club or organization: Not on file    Attends meetings of clubs or organizations: Not on file     Relationship status: Not on file  Other Topics Concern  . Not on file  Social History Narrative   HSG, retired from [employer] . married '60. 1 son - '65; 1 daughter - '61; 2 grandchildren; 1 great-grandchild. Homemaker. marriage in good health. 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 Positive history of passive tobacco smoke exposure.   Caffeine Use: 1 cup daily; 2 glasses of tea daily    His Allergies Are:  Allergies  Allergen Reactions  . Keflex [Cephalexin] Diarrhea  :   His Current Medications Are:  Outpatient Encounter Medications as of 05/23/2018  Medication Sig  . albuterol (PROVENTIL HFA;VENTOLIN HFA) 108 (90 Base) MCG/ACT inhaler TAKE 2 PUFFS BY MOUTH EVERY 6 HOURS AS NEEDED FOR WHEEZE OR SHORTNESS OF BREATH  . amoxicillin-clavulanate (AUGMENTIN) 875-125 MG tablet Take 1 tablet by mouth 2 (two) times daily. One po bid x 7 days  . aspirin EC 81 MG tablet Take 1 tablet (81 mg total) by mouth daily.  . bisacodyl (DULCOLAX) 10 MG suppository Place 10 mg rectally as needed for mild constipation or moderate constipation.  . clonazePAM (KLONOPIN) 1 MG tablet TAKE 0.5-1 TABLETS (0.5-1 MG TOTAL) BY MOUTH DAILY AS NEEDED FOR ANXIETY.  Marland Kitchen docusate sodium (COLACE) 100 MG capsule Take 300 mg by mouth daily.   Marland Kitchen esomeprazole (NEXIUM) 40 MG capsule Take 1 capsule (40 mg total) by mouth daily.  . famotidine (PEPCID) 40 MG tablet TAKE 1 TABLET (40 MG TOTAL) BY MOUTH 2 (TWO) TIMES DAILY AS NEEDED FOR HEARTBURN.  . furosemide (LASIX) 20 MG tablet Take 1 tablet (20 mg total) by mouth daily as needed for fluid or edema.  . hyoscyamine (LEVSIN SL) 0.125 MG SL tablet One tablet dissolved under tongue up to twice a day for abdominal or side pain  . isosorbide mononitrate (IMDUR) 30 MG 24 hr tablet TAKE 1/2 TABLET BY MOUTH DAILY  . MYRBETRIQ 50 MG TB24 tablet Take 50 mg by mouth daily.  . nitroGLYCERIN (NITROSTAT) 0.4 MG SL tablet Place 1 tablet (0.4 mg total) under the tongue every 5 (five) minutes as needed  (chest pain).  . propranolol (INDERAL) 10 MG tablet TAKE 1 TABLET BY MOUTH 3 TIMES DAILY AS NEEDED (TREMORS). (Patient taking differently: Take 10 mg by mouth in the morning)  . rosuvastatin (CRESTOR) 20 MG tablet TAKE 0.5 TABLETS (10 MG TOTAL) BY MOUTH DAILY. (Patient taking differently: Take 10 mg by mouth at bedtime. )  . sertraline (ZOLOFT) 25 MG tablet TAKE 1 TABLET (25 MG TOTAL) BY MOUTH DAILY.  . SYNTHROID 112 MCG tablet TAKE 1 TABLET (112 MCG TOTAL) BY MOUTH DAILY BEFORE BREAKFAST.  Marland Kitchen trimethoprim (TRIMPEX) 100 MG tablet Take 100 mg by mouth daily.   No facility-administered encounter medications on file as of 05/23/2018.   :  Review of Systems:  Out of a complete 14 point review of systems, all are reviewed and negative with the exception of these symptoms as listed:  Review of Systems  Neurological:       Pt presents today for follow up. Pt reports that her speech has worsened. She reports that her body is stiffer  and weaker. Pt reports receiving a diagnosis of colitis recently.    Objective:  Neurological Exam  Physical Exam Physical Examination:   There were no vitals filed for this visit.  General Examination: The patient is a very pleasant 77 y.o. female in no acute distress. She appears frail and deconditioned, sitting in her wheelchair, leaning to the left. Well groomed.    HEENT:Normocephalic, atraumatic, pupils are equal, round and reactive to light and accommodation. Extraocular tracking shows limitation to all directions, particularly upper gaze, she has saccadic breakdown, smooth pursuit is impaired, hearing mildly impaired, moderate facial masking is noted, speech is moderately hypophonic, mild scanning noted, no sialorrhea, neck is moderately rigid, she has a mild head tremor, head is more tilted to the left and she is more leaning to the left in her wheelchair, both worse.   Chest:Clear to auscultation without wheezing, rhonchi or crackles  noted.  Heart:S1+S2+0, regular and normal without murmurs, rubs or gallops noted.   Abdomen:Soft, non-tender and non-distended with normal bowel sounds appreciated on auscultation.  Extremities:There is 1+ pitting edema in the distal lower extremities bilaterally, some bruising noted, some non-pitting puffiness in both hands, edema is worse in the left lower extremity.  Skin: Warm and dry without trophic changes noted. She has some smaller bruises on her hands.   Musculoskeletal: exam reveals no obvious joint deformities, tenderness or joint swelling or erythema.   Neurologically:  Mental status: The patient is awake, alert and oriented in all 4 spheres. Her memory, attention, language and knowledge are fairly well preserved. There is no aphasia, agnosia, apraxia or anomia. Thought process is linear. Mood is congruent and affect is normal.  Cranial nerves are as described above under HEENT exam. Motor exam: she has thin bulk, 4/5 global strength and tone is increased throughout, moderately, R>L. There is no drift,  intermittent mild resting tremor in both upper extremities noted, grip strength is weaker on the right, she has decrease in range of motion in her right arm and hand. Romberg is not testable safely and she cannot stand or walk for me. Reflexes are brisker in the lower extremities, about 2+ in the upper extremities, fine motor skills are severely impaired except slightly better in the left upper extremity. Sensory exam is intact to light touch.   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 She has progressed with time, as is expected for  this rare, neurodegenerative disease. She has a very supportive husband and caretakers at the house.  She has had HH PT and PCP has recently renewed the order. She has become more deconditioned and frail. Her husband has had his own medical issues in the past years.Their son lives with them and can assist at night if needed. 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 Her CT neck without contrast in April 2018 confirmedmultilevel arthritic changes in her neck.She has fallen repeatedly in the past. She was seen at [facility] for second opinion and also [facility], first in our around August 2013.  She is at this point confined to her wheelchair. She sleeps in her lift chair. She hasseen a GI specialist and a urologist for her bladder spasms and recurrent urinary tract infections and recently had a bout of colitis. We talked about the importance of being proactive about constipation issues. She has not had any recent issues swallowing thankfully. I will see her back routinely in 6 months. I answered all their questions today and the patient and her husband were in agreement. I spent 25 minutes in total face-to-face time with the patient, more than 50% of which was spent in counseling and coordination of care, reviewing test results, reviewing medication and discussing or reviewing the diagnosis of MSA, its prognosis and treatment options. Pertinent laboratory and imaging test results that were available during this visit with the patient were reviewed by me and considered in my medical decision making (see chart for details).

## 2018-05-23 NOTE — Patient Instructions (Addendum)
You have progressed with time.  Home health therapy has been requested by Dr. Darnell Level.  Involving palliative care is reasonable at this point.

## 2018-05-24 DIAGNOSIS — G903 Multi-system degeneration of the autonomic nervous system: Secondary | ICD-10-CM | POA: Diagnosis not present

## 2018-05-25 ENCOUNTER — Other Ambulatory Visit: Payer: Self-pay | Admitting: Family Medicine

## 2018-05-26 ENCOUNTER — Ambulatory Visit: Payer: Medicare Other

## 2018-05-30 ENCOUNTER — Other Ambulatory Visit: Payer: Self-pay | Admitting: Family Medicine

## 2018-05-30 DIAGNOSIS — E7800 Pure hypercholesterolemia, unspecified: Secondary | ICD-10-CM

## 2018-05-30 DIAGNOSIS — E538 Deficiency of other specified B group vitamins: Secondary | ICD-10-CM

## 2018-05-30 DIAGNOSIS — E559 Vitamin D deficiency, unspecified: Secondary | ICD-10-CM

## 2018-05-30 DIAGNOSIS — E039 Hypothyroidism, unspecified: Secondary | ICD-10-CM

## 2018-06-01 ENCOUNTER — Ambulatory Visit (INDEPENDENT_AMBULATORY_CARE_PROVIDER_SITE_OTHER): Payer: Medicare Other

## 2018-06-01 ENCOUNTER — Other Ambulatory Visit: Payer: Self-pay

## 2018-06-01 VITALS — BP 124/70 | HR 83 | Temp 98.0°F

## 2018-06-01 DIAGNOSIS — E538 Deficiency of other specified B group vitamins: Secondary | ICD-10-CM | POA: Diagnosis not present

## 2018-06-01 DIAGNOSIS — E559 Vitamin D deficiency, unspecified: Secondary | ICD-10-CM | POA: Diagnosis not present

## 2018-06-01 DIAGNOSIS — E7800 Pure hypercholesterolemia, unspecified: Secondary | ICD-10-CM | POA: Diagnosis not present

## 2018-06-01 DIAGNOSIS — Z0000 Encounter for general adult medical examination without abnormal findings: Secondary | ICD-10-CM | POA: Diagnosis not present

## 2018-06-01 DIAGNOSIS — E039 Hypothyroidism, unspecified: Secondary | ICD-10-CM

## 2018-06-01 LAB — HEPATIC FUNCTION PANEL
ALT: 15 U/L (ref 0–35)
AST: 13 U/L (ref 0–37)
Albumin: 4 g/dL (ref 3.5–5.2)
Alkaline Phosphatase: 105 U/L (ref 39–117)
BILIRUBIN DIRECT: 0.1 mg/dL (ref 0.0–0.3)
BILIRUBIN TOTAL: 0.4 mg/dL (ref 0.2–1.2)
Total Protein: 6.8 g/dL (ref 6.0–8.3)

## 2018-06-01 LAB — LIPID PANEL
CHOLESTEROL: 135 mg/dL (ref 0–200)
HDL: 49.8 mg/dL (ref >39.00)
LDL CALC: 58 mg/dL (ref 0–99)
NonHDL: 85.03
TRIGLYCERIDES: 137 mg/dL (ref 0.0–149.0)
Total CHOL/HDL Ratio: 3
VLDL: 27.4 mg/dL (ref 0.0–40.0)

## 2018-06-01 LAB — T4, FREE: Free T4: 1.71 ng/dL — ABNORMAL HIGH (ref 0.60–1.60)

## 2018-06-01 LAB — VITAMIN B12: Vitamin B-12: >1500 pg/mL — ABNORMAL HIGH (ref 211–911)

## 2018-06-01 LAB — VITAMIN D 25 HYDROXY (VIT D DEFICIENCY, FRACTURES): VITD: 35.42 ng/mL (ref 30.00–100.00)

## 2018-06-01 LAB — TSH: TSH: 0.44 u[IU]/mL (ref 0.35–4.50)

## 2018-06-01 MED ORDER — CYANOCOBALAMIN 1000 MCG/ML IJ SOLN
1000.0000 ug | Freq: Once | INTRAMUSCULAR | Status: AC
  Administered 2018-06-01: 1000 ug via INTRAMUSCULAR

## 2018-06-01 NOTE — Progress Notes (Signed)
PCP notes:   Health maintenance:  Tetanus vaccine - postponed/insurance  Abnormal screenings:   None  Patient concerns:   Medication refill request for Lasix. Request forwarded to PCP.  Patient reports feeling a biting sensation. However, spouse states there is no evidence on skin. Patient wonders if health condition is cause of biting sensation.   Nurse concerns:  None  Next PCP appt:   06/13/18 @ 1500

## 2018-06-01 NOTE — Patient Instructions (Signed)
[name] [name] , Thank you for taking time to come for your Medicare Wellness Visit. I appreciate your ongoing commitment to your health goals. Please review the following plan we discussed and let me know if I can assist you in the future.   These are the goals we discussed: Goals    . Patient Stated     Starting 06/01/2018, I will continue to take medications as prescribed.        This is a list of the screening recommended for you and due dates:  Health Maintenance  Topic Date Due  . DTaP/Tdap/Td vaccine (1 - Tdap) 06/02/2019*  . Tetanus Vaccine  06/02/2019*  . Flu Shot  06/09/2018  . DEXA scan (bone density measurement)  Completed  . Pneumonia vaccines  Completed  *Topic was postponed. The date shown is not the original due date.   Preventive Care for Adults  A healthy lifestyle and preventive care can promote health and wellness. Preventive health guidelines for adults include the following key practices.  . A routine yearly physical is a good way to check with your health care provider about your health and preventive screening. It is a chance to share any concerns and updates on your health and to receive a thorough exam.  . Visit your dentist for a routine exam and preventive care every 6 months. Brush your teeth twice a day and floss once a day. Good oral hygiene prevents tooth decay and gum disease.  . The frequency of eye exams is based on your age, health, family medical history, use  of contact lenses, and other factors. Follow your health care provider's recommendations for frequency of eye exams.  . Eat a healthy diet. Foods like vegetables, fruits, whole grains, low-fat dairy products, and lean protein foods contain the nutrients you need without too many calories. Decrease your intake of foods high in solid fats, added sugars, and salt. Eat the right amount of calories for you. Get information about a proper diet from your health care provider, if necessary.  . Regular  physical exercise is one of the most important things you can do for your health. Most adults should get at least 150 minutes of moderate-intensity exercise (any activity that increases your heart rate and causes you to sweat) each week. In addition, most adults need muscle-strengthening exercises on 2 or more days a week.  Silver Sneakers may be a benefit available to you. To determine eligibility, you may visit the website: www.silversneakers.com or contact program at 475-297-6693 Mon-Fri between 8AM-8PM.   . Maintain a healthy weight. The body mass index (BMI) is a screening tool to identify possible weight problems. It provides an estimate of body fat based on height and weight. Your health care provider can find your BMI and can help you achieve or maintain a healthy weight.   For adults 20 years and older: ? A BMI below 18.5 is considered underweight. ? A BMI of 18.5 to 24.9 is normal. ? A BMI of 25 to 29.9 is considered overweight. ? A BMI of 30 and above is considered obese.   . Maintain normal blood lipids and cholesterol levels by exercising and minimizing your intake of saturated fat. Eat a balanced diet with plenty of fruit and vegetables. Blood tests for lipids and cholesterol should begin at age 29 and be repeated every 5 years. If your lipid or cholesterol levels are high, you are over 50, or you are at high risk for heart disease, you may need  your cholesterol levels checked more frequently. Ongoing high lipid and cholesterol levels should be treated with medicines if diet and exercise are not working.  . If you smoke, find out from your health care provider how to quit. If you do not use tobacco, please do not start.  . If you choose to drink alcohol, please do not consume more than 2 drinks per day. One drink is considered to be 12 ounces (355 mL) of beer, 5 ounces (148 mL) of wine, or 1.5 ounces (44 mL) of liquor.  . If you are 50-9 years old, ask your health care provider if  you should take aspirin to prevent strokes.  . Use sunscreen. Apply sunscreen liberally and repeatedly throughout the day. You should seek shade when your shadow is shorter than you. Protect yourself by wearing long sleeves, pants, a wide-brimmed hat, and sunglasses year round, whenever you are outdoors.  . Once a month, do a whole body skin exam, using a mirror to look at the skin on your back. Tell your health care provider of new moles, moles that have irregular borders, moles that are larger than a pencil eraser, or moles that have changed in shape or color.

## 2018-06-01 NOTE — Progress Notes (Signed)
Subjective:   [name] LESH is a 77 y.o. female who presents for Medicare Annual (Subsequent) preventive examination.  Review of Systems:  N/A Cardiac Risk Factors include: advanced age (>31mn, >>69women)     Objective:     Vitals: BP 124/70 (BP Location: Left Arm, Patient Position: Sitting, Cuff Size: Normal)   Pulse 83   Temp 98 F (36.7 C) (Oral)   SpO2 98%   There is no height or weight on file to calculate BMI.  Advanced Directives 06/01/2018 04/14/2017 03/07/2017 07/28/2016 05/26/2016 03/17/2016 01/22/2016  Does Patient Have a Medical Advance Directive? Yes No Yes - Yes No Yes  Type of [place]rintmakerof [place]ri[place] will [place] will Living will - -  Does patient want to make changes to medical advance directive? - - - No - Patient declined - - -  Copy of HBynumin Chart? Yes - - - - - -  Would patient like information on creating a medical advance directive? - - - - - No - patient declined information -  Pre-existing out of facility DNR order (yellow form or pink MOST form) - - - - - - -    Tobacco Social History   Tobacco Use  Smoking Status Never Smoker  Smokeless Tobacco Never Used     Counseling given: No   Clinical Intake:  Pre-visit preparation completed: Yes  Pain Score: 3      Nutritional Risks: None Diabetes: No  How often do you need to have someone help you when you read instructions, pamphlets, or other written materials from your doctor or pharmacy?: 3 - Sometimes What is the last grade level you completed in school?: 12th grade  Interpreter Needed?: No  Comments: pt lives with spouse Information entered by :: LPinson, LPN  Past Medical History:  Diagnosis Date  . Asthma   . CAD (coronary artery disease)    myoview 5/08:  EF 76%, no scar, no ischemia, +ECG changes with exercise;  cath 04/08/07:  pLAD 30%, mLAD 60-70% - med tx.  . Chronic  cystitis    recurrent UTIs, started bactrim ppx (McDiarmid)  . Depression   . Familial tremor    followed by Dr. LErling Cruz . GERD (gastroesophageal reflux disease)    s/p nissen  . History of pneumonia   . HLD (hyperlipidemia)   . Hydronephrosis, bilateral   . Hypothyroidism   . Internal hemorrhoids   . [facility] ([name] [name]  . Neurogenic bladder    Tannenbaum/MacDiarmid  . Osteoporosis 2016   DEXA T-4.4 spine deteriorated since 2012  . Unspecified chronic bronchitis ([facility]   . VAGINITIS, ATROPHIC 11/07/2007   Past Surgical History:  Procedure Laterality Date  . COLONOSCOPY N/A 08/29/2013   int hemm; RInda Castle MD  . CYSTECTOMY    . DEXA  12/2014   T -4.4 spine  . ESOPHAGOGASTRODUODENOSCOPY N/A 01/09/2015   Procedure: ESOPHAGOGASTRODUODENOSCOPY (EGD);  Surgeon: RInda Castle MD;  [facility];  Service: Endoscopy;  Laterality: N/A;  help with transfers  . LAPAROSCOPIC NISSEN FUNDOPLICATION    . TUBAL LIGATION    . TUMOR REMOVAL     Family History  Problem Relation Age of Onset  . Hypertension Mother   . Coronary artery disease Father   . Heart attack Father   . Heart disease Father   . Diabetes Daughter   .  Breast cancer Maternal Aunt   . Coronary artery disease Daughter   . Colon cancer Maternal Aunt   . Multiple sclerosis Daughter   . Stroke Daughter   . Heart attack Daughter    Social History   Socioeconomic History  . Marital status: Married    Spouse name: [name]  . Number of children: 2  . Years of education: HS  . Highest education level: Not on file  Occupational History    Employer: OTHER  Social Needs  . Financial resource strain: Not on file  . Food insecurity:    Worry: Not on file    Inability: Not on file  . Transportation needs:    Medical: Not on file    Non-medical: Not on file  Tobacco Use  . Smoking status: Never Smoker  . Smokeless tobacco: Never Used  Substance and Sexual Activity  .  Alcohol use: No    Alcohol/week: 0.0 oz  . Drug use: No  . Sexual activity: Not Currently  Lifestyle  . Physical activity:    Days per week: Not on file    Minutes per session: Not on file  . Stress: Not on file  Relationships  . Social connections:    Talks on phone: Not on file    Gets together: Not on file    Attends religious service: Not on file    Active member of club or organization: Not on file    Attends meetings of clubs or organizations: Not on file    Relationship status: Not on file  Other Topics Concern  . Not on file  Social History Narrative   HSG, retired from [employer] . married '60. 1 son - '65; 1 daughter - '61; 2 grandchildren; 1 great-grandchild. Homemaker. marriage in good health. 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 Positive history of passive tobacco smoke exposure.   Caffeine Use: 1 cup daily; 2 glasses of tea daily    Outpatient Encounter Medications as of 06/01/2018  Medication Sig  . albuterol (PROVENTIL HFA;VENTOLIN HFA) 108 (90 Base) MCG/ACT inhaler TAKE 2 PUFFS BY MOUTH EVERY 6 HOURS AS NEEDED FOR WHEEZE OR SHORTNESS OF BREATH  . amoxicillin-clavulanate (AUGMENTIN) 875-125 MG tablet Take 1 tablet by mouth 2 (two) times daily. One po bid x 7 days  . aspirin EC 81 MG tablet Take 1 tablet (81 mg total) by mouth daily.  . bisacodyl (DULCOLAX) 10 MG suppository Place 10 mg rectally as needed for mild constipation or moderate constipation.  . clonazePAM (KLONOPIN) 1 MG tablet TAKE 0.5-1 TABLETS (0.5-1 MG TOTAL) BY MOUTH DAILY AS NEEDED FOR ANXIETY.  Marland Kitchen docusate sodium (COLACE) 100 MG capsule Take 300 mg by mouth daily.   Marland Kitchen esomeprazole (NEXIUM) 40 MG capsule Take 1 capsule (40 mg total) by mouth daily.  . famotidine (PEPCID) 40 MG tablet TAKE 1 TABLET (40 MG TOTAL) BY MOUTH 2 (TWO) TIMES DAILY AS NEEDED FOR HEARTBURN.  . furosemide (LASIX) 20 MG tablet Take 1 tablet (20 mg total) by mouth daily as needed for fluid or edema.  . hyoscyamine (LEVSIN SL) 0.125 MG SL  tablet One tablet dissolved under tongue up to twice a day for abdominal or side pain  . isosorbide mononitrate (IMDUR) 30 MG 24 hr tablet TAKE 1/2 TABLET BY MOUTH DAILY  . MYRBETRIQ 50 MG TB24 tablet Take 50 mg by mouth daily.  . nitroGLYCERIN (NITROSTAT) 0.4 MG SL tablet Place 1 tablet (0.4 mg total) under the tongue every 5 (five) minutes as needed (chest  pain).  . propranolol (INDERAL) 10 MG tablet TAKE 1 TABLET BY MOUTH 3 TIMES DAILY AS NEEDED (TREMORS). (Patient taking differently: Take 10 mg by mouth in the morning)  . rosuvastatin (CRESTOR) 20 MG tablet TAKE 0.5 TABLETS (10 MG TOTAL) BY MOUTH DAILY. (Patient taking differently: Take 10 mg by mouth at bedtime. )  . sertraline (ZOLOFT) 25 MG tablet TAKE 1 TABLET (25 MG TOTAL) BY MOUTH DAILY.  . SYNTHROID 112 MCG tablet TAKE 1 TABLET (112 MCG TOTAL) BY MOUTH DAILY BEFORE BREAKFAST.  Marland Kitchen trimethoprim (TRIMPEX) 100 MG tablet Take 100 mg by mouth daily.  . [EXPIRED] cyanocobalamin ((VITAMIN B-12)) injection 1,000 mcg    No facility-administered encounter medications on file as of 06/01/2018.     Activities of Daily Living In your present state of health, do you have any difficulty performing the following activities: 06/01/2018  Hearing? Y  Vision? N  Difficulty concentrating or making decisions? N  Walking or climbing stairs? Y  Dressing or bathing? Y  Doing errands, shopping? Y  Preparing Food and eating ? Y  Using the Toilet? Y  In the past six months, have you accidently leaked urine? Y  Do you have problems with loss of bowel control? Y  Managing your Medications? Y  Managing your Finances? Y  Housekeeping or managing your Housekeeping? Y  Some recent data might be hidden    Patient Care Team: Ria Bush, MD as PCP - General (Family Medicine) Carolan Clines, MD (Urology) Rozetta Nunnery, MD (Otolaryngology) Deneise Lever, MD (Pulmonary Disease) Love, Alyson Locket, MD (Neurology) Roseanne Kaufman, MD (Orthopedic  Surgery) [name], MD as Consulting Physician (Cardiology) Pyrtle, Lajuan Lines, MD as Consulting Physician (Gastroenterology) Druscilla Brownie, MD as Consulting Physician (Dermatology) Star Age, MD as Attending Physician (Neurology) Clent Jacks, MD as Consulting Physician (Ophthalmology)    Assessment:   This is a routine wellness examination for Lemmie.  Hearing Screening Comments: Bilateral hearing aids Vision Screening Comments: Unable to complete; pt does not have glasses   Exercise Activities and Dietary recommendations Current Exercise Habits: The patient does not participate in regular exercise at present, Exercise limited by: orthopedic condition(s)  Goals    . Patient Stated     Starting 06/01/2018, I will continue to take medications as prescribed.        Fall Risk Fall Risk  06/01/2018 07/28/2016 04/28/2016 11/27/2014  Falls in the past year? No No Yes Yes  Number falls in past yr: - - 2 or more 2 or more  Injury with Fall? - - No -  Risk Factor Category  - - High Fall Risk High Fall Risk  Risk for fall due to : - - Impaired balance/gait;Impaired mobility Impaired balance/gait;Impaired mobility  Follow up - - Falls prevention discussed -    Depression Screen PHQ 2/9 Scores 06/01/2018 07/28/2016 11/27/2014  PHQ - 2 Score 0 0 0  PHQ- 9 Score 0 - -     Cognitive Function MMSE - Mini Mental State Exam 06/01/2018 07/28/2016  Not completed: Unable to complete -  Orientation to time - 5  Orientation to Place - 5  Registration - 3  Attention/ Calculation - 0  Recall - 3  Language- name 2 objects - 0  Language- repeat - 1  Language- follow 3 step command - 3  Language- read & follow direction - 0  Write a sentence - 0  Copy design - 0  Total score - 20     PLEASE NOTE: A  Mini-Cog screen was completed. Maximum score is 20. A value of 0 denotes this part of Folstein MMSE was not completed or the patient failed this part of the Mini-Cog screening.   Mini-Cog  Screening Orientation to Time - Max 5 pts Orientation to Place - Max 5 pts Registration - Max 3 pts Recall - Max 3 pts Language Repeat - Max 1 pts Language Follow 3 Step Command - Max 3 pts     Immunization History  Administered Date(s) Administered  . Influenza Split 08/24/2012  . Influenza Whole 09/19/1998, 08/08/2008, 08/15/2009  . Influenza, High Dose Seasonal PF 08/27/2015, 08/27/2015  . Influenza,inj,Quad PF,6+ Mos 07/16/2016, 07/30/2017  . Influenza-Unspecified 08/09/2013, 08/09/2014  . Pneumococcal Conjugate-13 12/13/2013  . Pneumococcal Polysaccharide-23 09/19/1998, 05/15/2008  . Td 05/15/2008  . Zoster 01/27/2010   Screening Tests Health Maintenance  Topic Date Due  . DTaP/Tdap/Td (1 - Tdap) 06/02/2019 (Originally 05/16/2008)  . TETANUS/TDAP  06/02/2019 (Originally 05/15/2018)  . INFLUENZA VACCINE  06/09/2018  . DEXA SCAN  Completed  . PNA vac Low Risk Adult  Completed      Plan:   I have personally reviewed, addressed, and noted the following in the patient's chart:  A. Medical and social history B. Use of alcohol, tobacco or illicit drugs  C. Current medications and supplements D. Functional ability and status E.  Nutritional status F.  Physical activity G. Advance directives H. List of other physicians I.  Hospitalizations, surgeries, and ER visits in previous 12 months J.  [place] to include hearing, vision, cognitive, depression L. Referrals and appointments - none  In addition, I have reviewed and discussed with patient certain preventive protocols, quality metrics, and best practice recommendations. A written personalized care plan for preventive services as well as general preventive health recommendations were provided to patient.  See attached scanned questionnaire for additional information.   Signed,   Lindell Noe, MHA, BS, LPN Health Coach

## 2018-06-01 NOTE — Telephone Encounter (Signed)
Patient and spouse in office today for AWV. Spouse is requesting refill of Lasix be sent to [facility] because patient is experiencing swelling in both legs and feet.

## 2018-06-02 MED ORDER — FUROSEMIDE 20 MG PO TABS: 20.0000 mg | ORAL_TABLET | Freq: Every day | ORAL | 0 refills | Status: DC | PRN

## 2018-06-02 NOTE — Telephone Encounter (Signed)
Sent.  If worse or not better then update [place].  Has f/u pending.  Thanks.

## 2018-06-07 DIAGNOSIS — G239 Degenerative disease of basal ganglia, unspecified: Secondary | ICD-10-CM | POA: Diagnosis not present

## 2018-06-07 DIAGNOSIS — R296 Repeated falls: Secondary | ICD-10-CM | POA: Diagnosis not present

## 2018-06-07 DIAGNOSIS — R531 Weakness: Secondary | ICD-10-CM | POA: Diagnosis not present

## 2018-06-07 DIAGNOSIS — M6281 Muscle weakness (generalized): Secondary | ICD-10-CM | POA: Diagnosis not present

## 2018-06-08 NOTE — Progress Notes (Signed)
I reviewed health advisor's note, was available for consultation, and agree with documentation and plan.  

## 2018-06-09 DIAGNOSIS — M6281 Muscle weakness (generalized): Secondary | ICD-10-CM | POA: Diagnosis not present

## 2018-06-09 DIAGNOSIS — G239 Degenerative disease of basal ganglia, unspecified: Secondary | ICD-10-CM | POA: Diagnosis not present

## 2018-06-09 DIAGNOSIS — R531 Weakness: Secondary | ICD-10-CM | POA: Diagnosis not present

## 2018-06-09 DIAGNOSIS — R296 Repeated falls: Secondary | ICD-10-CM | POA: Diagnosis not present

## 2018-06-13 ENCOUNTER — Encounter: Payer: Self-pay | Admitting: Family Medicine

## 2018-06-13 ENCOUNTER — Ambulatory Visit (INDEPENDENT_AMBULATORY_CARE_PROVIDER_SITE_OTHER): Payer: Medicare Other | Admitting: Family Medicine

## 2018-06-13 VITALS — BP 136/80 | HR 70 | Temp 97.8°F

## 2018-06-13 DIAGNOSIS — M810 Age-related osteoporosis without current pathological fracture: Secondary | ICD-10-CM

## 2018-06-13 DIAGNOSIS — K219 Gastro-esophageal reflux disease without esophagitis: Secondary | ICD-10-CM

## 2018-06-13 DIAGNOSIS — G239 Degenerative disease of basal ganglia, unspecified: Secondary | ICD-10-CM

## 2018-06-13 DIAGNOSIS — R209 Unspecified disturbances of skin sensation: Secondary | ICD-10-CM | POA: Diagnosis not present

## 2018-06-13 DIAGNOSIS — E7800 Pure hypercholesterolemia, unspecified: Secondary | ICD-10-CM

## 2018-06-13 DIAGNOSIS — R296 Repeated falls: Secondary | ICD-10-CM | POA: Diagnosis not present

## 2018-06-13 DIAGNOSIS — N3020 Other chronic cystitis without hematuria: Secondary | ICD-10-CM

## 2018-06-13 DIAGNOSIS — E559 Vitamin D deficiency, unspecified: Secondary | ICD-10-CM

## 2018-06-13 DIAGNOSIS — R251 Tremor, unspecified: Secondary | ICD-10-CM

## 2018-06-13 DIAGNOSIS — Z0001 Encounter for general adult medical examination with abnormal findings: Secondary | ICD-10-CM | POA: Diagnosis not present

## 2018-06-13 DIAGNOSIS — A09 Infectious gastroenteritis and colitis, unspecified: Secondary | ICD-10-CM

## 2018-06-13 DIAGNOSIS — F331 Major depressive disorder, recurrent, moderate: Secondary | ICD-10-CM

## 2018-06-13 DIAGNOSIS — E538 Deficiency of other specified B group vitamins: Secondary | ICD-10-CM | POA: Diagnosis not present

## 2018-06-13 DIAGNOSIS — Z7189 Other specified counseling: Secondary | ICD-10-CM

## 2018-06-13 DIAGNOSIS — R1310 Dysphagia, unspecified: Secondary | ICD-10-CM

## 2018-06-13 DIAGNOSIS — E039 Hypothyroidism, unspecified: Secondary | ICD-10-CM

## 2018-06-13 DIAGNOSIS — G969 Disorder of central nervous system, unspecified: Secondary | ICD-10-CM

## 2018-06-13 DIAGNOSIS — F5104 Psychophysiologic insomnia: Secondary | ICD-10-CM

## 2018-06-13 DIAGNOSIS — G903 Multi-system degeneration of the autonomic nervous system: Secondary | ICD-10-CM

## 2018-06-13 DIAGNOSIS — K5902 Outlet dysfunction constipation: Secondary | ICD-10-CM

## 2018-06-13 MED ORDER — TRAZODONE HCL 50 MG PO TABS: 25.0000 mg | ORAL_TABLET | Freq: Every evening | ORAL | 3 refills | Status: DC | PRN

## 2018-06-13 MED ORDER — VITAMIN D3 25 MCG (1000 UT) PO CAPS: 1.0000 | ORAL_CAPSULE | Freq: Every day | ORAL | Status: AC

## 2018-06-13 MED ORDER — LEVOTHYROXINE SODIUM 100 MCG PO TABS: 100.0000 ug | ORAL_TABLET | Freq: Every day | ORAL | 11 refills | Status: AC

## 2018-06-13 NOTE — Progress Notes (Signed)
BP 136/80 (BP Location: Left Arm, Patient Position: Sitting, Cuff Size: Normal)   Pulse 70   Temp 97.8 F (36.6 C) (Oral)   SpO2 94%    CC: CPE Subjective:    Patient ID: [name] [name], female    DOB: 17-Mar-1941, 77 y.o.   MRN: [id number]  HPI: [name] [name] is a 77 y.o. female presenting on 06/13/2018 for Annual Exam (Pt 2.)   Saw Katha Cabal last week for medicare wellness visit. Note reviewed.   Over the last 5 weeks, feeling biting sensations throughout skin mainly groin and lower extremities worse at night time. This is despite husband cleaning vigorously. Would like to see dermatologist.   Palliative care has established with patient.   Preventative: COLONOSCOPY Laterality: N/A Date: 08/29/2013 int hemm; Inda Castle, MD Well woman - last pelvic exam was 5 yrs ago. Stopped pap smears 5 yrs ago, overall normal paps in past Mammo 02/2014 - Birads1. Wouldn't be able to stand up for mammogram. She does regular breast exams at home.  DEXA 2012 - osteoporosis T-2.7 hip, -3.6 spine. Rpt 2016: DEXA T-4.4 spine deteriorated since 2012.  Flu shot yearly prevnar 12/2013, pneumovax 1999, 2009 Td 2009,  Zostavax 2011 Advanced planning - Palliative care came out to house - see scanned forms 05/31/2018. Pt desires DNR and comfort care, not ready for hospice yet.  Seat belt use - regularly Sunscreen use discussed. No changing moles on skin Non smoker No alcohol use  HSG, retired from [employer] . married '60. 1 son - '65; 1 daughter - '61; 2 grandchildren; 1 great-grandchild. Homemaker. marriage in good health. 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 Positive history of passive tobacco smoke exposure. Caffeine Use: 1 cup daily; 2 glasses of tea daily  Relevant past medical, surgical, family and social history reviewed and updated as indicated. Interim medical history since our last visit reviewed. Allergies and medications reviewed and updated. Outpatient Medications Prior to Visit  Medication  Sig Dispense Refill  . albuterol (PROVENTIL HFA;VENTOLIN HFA) 108 (90 Base) MCG/ACT inhaler TAKE 2 PUFFS BY MOUTH EVERY 6 HOURS AS NEEDED FOR WHEEZE OR SHORTNESS OF BREATH 8.5 Inhaler 0  . aspirin EC 81 MG tablet Take 1 tablet (81 mg total) by mouth daily.    . bisacodyl (DULCOLAX) 10 MG suppository Place 10 mg rectally as needed for mild constipation or moderate constipation.    . clonazePAM (KLONOPIN) 1 MG tablet TAKE 0.5-1 TABLETS (0.5-1 MG TOTAL) BY MOUTH DAILY AS NEEDED FOR ANXIETY. 30 tablet 3  . docusate sodium (COLACE) 100 MG capsule Take 300 mg by mouth daily.     Marland Kitchen esomeprazole (NEXIUM) 40 MG capsule Take 1 capsule (40 mg total) by mouth daily. 30 capsule 6  . famotidine (PEPCID) 40 MG tablet TAKE 1 TABLET (40 MG TOTAL) BY MOUTH 2 (TWO) TIMES DAILY AS NEEDED FOR HEARTBURN. 180 tablet 3  . furosemide (LASIX) 20 MG tablet Take 1 tablet (20 mg total) by mouth daily as needed for fluid or edema. 30 tablet 0  . hyoscyamine (LEVSIN SL) 0.125 MG SL tablet One tablet dissolved under tongue up to twice a day for abdominal or side pain 30 tablet 0  . isosorbide mononitrate (IMDUR) 30 MG 24 hr tablet TAKE 1/2 TABLET BY MOUTH DAILY 15 tablet 9  . MYRBETRIQ 50 MG TB24 tablet Take 50 mg by mouth daily.  11  . nitroGLYCERIN (NITROSTAT) 0.4 MG SL tablet Place 1 tablet (0.4 mg total) under the tongue every 5 (five)  minutes as needed (chest pain). 25 tablet 3  . propranolol (INDERAL) 10 MG tablet TAKE 1 TABLET BY MOUTH 3 TIMES DAILY AS NEEDED (TREMORS). (Patient taking differently: Take 10 mg by mouth in the morning) 180 tablet 0  . rosuvastatin (CRESTOR) 20 MG tablet TAKE 0.5 TABLETS (10 MG TOTAL) BY MOUTH DAILY. (Patient taking differently: Take 10 mg by mouth at bedtime. ) 45 tablet 1  . sertraline (ZOLOFT) 25 MG tablet TAKE 1 TABLET (25 MG TOTAL) BY MOUTH DAILY. 90 tablet 3  . trimethoprim (TRIMPEX) 100 MG tablet Take 100 mg by mouth daily.  11  . SYNTHROID 112 MCG tablet TAKE 1 TABLET (112 MCG TOTAL)  BY MOUTH DAILY BEFORE BREAKFAST. 30 tablet 1  . cyanocobalamin (,VITAMIN B-12,) 1000 MCG/ML injection Inject 1 mL (1,000 mcg total) into the muscle every 30 (thirty) days. 1 mL 0  . amoxicillin-clavulanate (AUGMENTIN) 875-125 MG tablet Take 1 tablet by mouth 2 (two) times daily. One po bid x 7 days 14 tablet 0   No facility-administered medications prior to visit.      Per HPI unless specifically indicated in ROS section below Review of Systems  Constitutional: Positive for chills (intermittent). Negative for activity change, appetite change, fatigue, fever and unexpected weight change.  HENT: Negative for hearing loss.   Eyes: Positive for visual disturbance.  Respiratory: Negative for cough, chest tightness, shortness of breath and wheezing.   Cardiovascular: Positive for leg swelling (R side). Negative for chest pain and palpitations.  Gastrointestinal: Positive for blood in stool (recent colitis), constipation and diarrhea (recent colitis). Negative for abdominal distention, abdominal pain, nausea and vomiting.  Genitourinary: Negative for difficulty urinating and hematuria.  Musculoskeletal: Negative for arthralgias, myalgias and neck pain.  Skin: Negative for rash.  Neurological: Negative for dizziness, seizures, syncope and headaches.  Hematological: Negative for adenopathy. Does not bruise/bleed easily.  Psychiatric/Behavioral: Negative for dysphoric mood. The patient is not nervous/anxious.        Taking sertraline 2m daily      Objective:    BP 136/80 (BP Location: Left Arm, Patient Position: Sitting, Cuff Size: Normal)   Pulse 70   Temp 97.8 F (36.6 C) (Oral)   SpO2 94%   Wt Readings from Last 3 Encounters:  06/14/17 145 lb (65.8 kg)  04/14/17 145 lb (65.8 kg)  03/07/17 145 lb (65.8 kg)    Physical Exam  Constitutional: She appears well-developed and well-nourished. No distress.  Seated in WC  HENT:  Head: Normocephalic and atraumatic.  Right Ear: Hearing and  tympanic membrane normal.  Left Ear: Hearing and tympanic membrane normal.  Nose: Nose normal.  Mouth/Throat: Uvula is midline, oropharynx is clear and moist and mucous membranes are normal. No oropharyngeal exudate, posterior oropharyngeal edema or posterior oropharyngeal erythema.  Eyes: Pupils are equal, round, and reactive to light. Conjunctivae and EOM are normal. No scleral icterus.  Neck: Normal range of motion. Neck supple. No thyromegaly present.  Cardiovascular: Normal rate, regular rhythm, normal heart sounds and intact distal pulses.  No murmur heard. Pulses:      Radial pulses are 2+ on the right side, and 2+ on the left side.  Pulmonary/Chest: Effort normal and breath sounds normal. No respiratory distress. She has no wheezes. She has no rales.  Abdominal: Soft. Bowel sounds are normal. She exhibits no distension and no mass. There is no tenderness. There is no rebound and no guarding.  Musculoskeletal: Normal range of motion. She exhibits no edema.  Lymphadenopathy:  She has no cervical adenopathy.  Neurological: She is alert.  Remains seated in wheelchair, leaning towards left Spasticity and rigitidy of extremities R>L  Skin: Skin is warm and dry. No rash noted. No erythema.  No obvious rash other than occasional ecchymoses appreciated throughout skin Did not see evidence of bug bite or rash at lower abdomen or groin  Psychiatric: She has a normal mood and affect. Her behavior is normal. Judgment and thought content normal.  Nursing note and vitals reviewed.  Results for orders placed or performed in visit on 06/01/18  Hepatic function panel  Result Value Ref Range   Total Bilirubin 0.4 0.2 - 1.2 mg/dL   Bilirubin, Direct 0.1 0.0 - 0.3 mg/dL   Alkaline Phosphatase 105 39 - 117 U/L   AST 13 0 - 37 U/L   ALT 15 0 - 35 U/L   Total Protein 6.8 6.0 - 8.3 g/dL   Albumin 4.0 3.5 - 5.2 g/dL  VITAMIN D 25 Hydroxy (Vit-D Deficiency, Fractures)  Result Value Ref Range    VITD 35.42 30.00 - 100.00 ng/mL  Vitamin B12  Result Value Ref Range   Vitamin B-12 >1500 (H) 211 - 911 pg/mL  T4, free  Result Value Ref Range   Free T4 1.71 (H) 0.60 - 1.60 ng/dL  TSH  Result Value Ref Range   TSH 0.44 0.35 - 4.50 uIU/mL  Lipid panel  Result Value Ref Range   Cholesterol 135 0 - 200 mg/dL   Triglycerides 137.0 0.0 - 149.0 mg/dL   HDL 49.80 >39.00 mg/dL   VLDL 27.4 0.0 - 40.0 mg/dL   LDL Cholesterol 58 0 - 99 mg/dL   Total CHOL/HDL Ratio 3    NonHDL 85.03       Assessment & Plan:   Problem List Items Addressed This Visit    Vitamin D deficiency    Well repleted - last visit we had stopped weekly replacement with 50k units due to elevated levels. now safe to restart 1000 IU daily.      Vitamin B12 deficiency    Level high - however unsure if she had blood drawn before of after b12 shot on same date. Continue monthly shots for now.      Tremor due to disorder of central nervous system    Continue klonopin and propranolol.       Skin sensation disturbance    Endorses feelings of bugs crawling and biting skin, without objective evidence of bug bites or rash. Encouraged regular moisturizer use. She may see derm for evaluation.       Recurrent falls    Confined to wheelchair.  Again receiving HH therapy.       Osteoporosis    Reviewed calcium in diet. rec continue vit D 1000 iu daily.      Relevant Medications   Cholecalciferol (VITAMIN D3) 1000 units CAPS   Multiple system atrophy (HCC)    Progression noted - discussed this.  HHPT involved. Palliative care now involved. Appreciate their care.      MDD (major depressive disorder)    Denies significant symptoms. She is on sertraline 90m daily. See below - will trial trazodone 25-533mfor sleep, and if effective consider taper off SSRI      Relevant Medications   traZODone (DESYREL) 50 MG tablet   Infectious colitis    Saw GI 6.2019, note reviewed. Overall improved.       Hypothyroidism      Chronic, showing signs of overtreatment -  will decrease synthroid to 161mg daily. Recheck next visit.       Relevant Medications   levothyroxine (SYNTHROID) 100 MCG tablet   HYPERCHOLESTEROLEMIA  IIA    Chronic, stable. Continue crestor 229m1/2 tab daily. The 10-year ASCVD risk score (GMikey BussingC JrBrooke Bonito et al., 2013) is: 31.2%   Values used to calculate the score:     Age: [age]ears     Sex: Female     Is Non-Hispanic African American: No     Diabetic: No     Tobacco smoker: No     Systolic Blood Pressure: 13188mHg     Is BP treated: Yes     HDL Cholesterol: 49.8 mg/dL     Total Cholesterol: 135 mg/dL       GERD    Stable on nexium and pepcid daily.       Encounter for general adult medical examination with abnormal findings - Primary    Preventative protocols reviewed and updated unless pt declined. Discussed healthy diet and lifestyle.       Dysphagia    Denies significant trouble with this, occasional trouble with large pills.       Constipation    Has been recommended daily stool softener and intermittent dulcolax suppository use to keep bowels regular.       Chronic insomnia    Klonopin is not helping. Averaging only a few hours of sleep. Will add trazodone at night.       Chronic cystitis    H/o recurrent UTIs. On trimethoprim ppx.       Advanced care planning/counseling discussion    Palliative care came out to house - see scanned forms 05/31/2018. Pt desires DNR and comfort care, not ready for hospice yet.           Meds ordered this encounter  Medications  . Cholecalciferol (VITAMIN D3) 1000 units CAPS    Sig: Take 1 capsule (1,000 Units total) by mouth daily.    Dispense:  30 capsule  . levothyroxine (SYNTHROID) 100 MCG tablet    Sig: Take 1 tablet (100 mcg total) by mouth daily before breakfast.    Dispense:  30 tablet    Refill:  11    Note new dose  . traZODone (DESYREL) 50 MG tablet    Sig: Take 0.5-1 tablets (25-50 mg total) by mouth at  bedtime as needed for sleep.    Dispense:  30 tablet    Refill:  3   No orders of the defined types were placed in this encounter.   Follow up plan: Return in about 3 months (around 09/13/2018) for follow up visit.  JaRia BushMD

## 2018-06-13 NOTE — Patient Instructions (Addendum)
Start vitamin D 1000 units daily. Make sure you get good amounts of calcium in the diet (dairy products and leafy green vegetables).  Synthroid was a bit high - decrease to 16mg daily. New dose is at the pharmacy.  Continue klonopin.  Trial trazodone 25-531m(1/2-1 tablet) at bedtime as needed for sleep. If helping, we may be able to stop sertraline.  Use moisturizing cream.

## 2018-06-14 DIAGNOSIS — R531 Weakness: Secondary | ICD-10-CM | POA: Diagnosis not present

## 2018-06-14 DIAGNOSIS — R296 Repeated falls: Secondary | ICD-10-CM | POA: Diagnosis not present

## 2018-06-14 DIAGNOSIS — G239 Degenerative disease of basal ganglia, unspecified: Secondary | ICD-10-CM | POA: Diagnosis not present

## 2018-06-14 DIAGNOSIS — R209 Unspecified disturbances of skin sensation: Secondary | ICD-10-CM | POA: Insufficient documentation

## 2018-06-14 DIAGNOSIS — M6281 Muscle weakness (generalized): Secondary | ICD-10-CM | POA: Diagnosis not present

## 2018-06-14 NOTE — Assessment & Plan Note (Signed)
Stable on nexium and pepcid daily.

## 2018-06-14 NOTE — Assessment & Plan Note (Signed)
Denies significant trouble with this, occasional trouble with large pills.

## 2018-06-14 NOTE — Assessment & Plan Note (Signed)
Confined to wheelchair.  Again receiving HH therapy.

## 2018-06-14 NOTE — Assessment & Plan Note (Addendum)
Saw GI 6.2019, note reviewed. Overall improved.

## 2018-06-14 NOTE — Assessment & Plan Note (Signed)
Preventative protocols reviewed and updated unless pt declined. Discussed healthy diet and lifestyle.  

## 2018-06-14 NOTE — Assessment & Plan Note (Signed)
Has been recommended daily stool softener and intermittent dulcolax suppository use to keep bowels regular.

## 2018-06-14 NOTE — Assessment & Plan Note (Signed)
Level high - however unsure if she had blood drawn before of after b12 shot on same date. Continue monthly shots for now.

## 2018-06-14 NOTE — Assessment & Plan Note (Addendum)
Chronic, showing signs of overtreatment - will decrease synthroid to 163mg daily. Recheck next visit.

## 2018-06-14 NOTE — Assessment & Plan Note (Signed)
Denies significant symptoms. She is on sertraline 50m daily. See below - will trial trazodone 25-510mfor sleep, and if effective consider taper off SSRI

## 2018-06-14 NOTE — Assessment & Plan Note (Signed)
Chronic, stable. Continue crestor 26m 1/2 tab daily. The 10-year ASCVD risk score (Mikey BussingDC JBrooke Bonito, et al., 2013) is: 31.2%   Values used to calculate the score:     Age: 89years     Sex: Female     Is Non-Hispanic African American: No     Diabetic: No     Tobacco smoker: No     Systolic Blood Pressure: 1169mmHg     Is BP treated: Yes     HDL Cholesterol: 49.8 mg/dL     Total Cholesterol: 135 mg/dL

## 2018-06-14 NOTE — Assessment & Plan Note (Signed)
Continue klonopin and propranolol.

## 2018-06-14 NOTE — Assessment & Plan Note (Signed)
H/o recurrent UTIs. On trimethoprim ppx.

## 2018-06-14 NOTE — Assessment & Plan Note (Addendum)
Well repleted - last visit we had stopped weekly replacement with 50k units due to elevated levels. now safe to restart 1000 IU daily.

## 2018-06-14 NOTE — Assessment & Plan Note (Signed)
Palliative care came out to house - see scanned forms 05/31/2018. Pt desires DNR and comfort care, not ready for hospice yet.

## 2018-06-14 NOTE — Assessment & Plan Note (Signed)
Reviewed calcium in diet. rec continue vit D 1000 iu daily.

## 2018-06-14 NOTE — Assessment & Plan Note (Addendum)
Endorses feelings of bugs crawling and biting skin, without objective evidence of bug bites or rash. Encouraged regular moisturizer use. She may see derm for evaluation.

## 2018-06-14 NOTE — Assessment & Plan Note (Addendum)
Progression noted - discussed this.  HHPT involved. Palliative care now involved. Appreciate their care.

## 2018-06-14 NOTE — Assessment & Plan Note (Signed)
Klonopin is not helping. Averaging only a few hours of sleep. Will add trazodone at night.

## 2018-06-16 DIAGNOSIS — M6281 Muscle weakness (generalized): Secondary | ICD-10-CM | POA: Diagnosis not present

## 2018-06-16 DIAGNOSIS — R531 Weakness: Secondary | ICD-10-CM | POA: Diagnosis not present

## 2018-06-16 DIAGNOSIS — R296 Repeated falls: Secondary | ICD-10-CM | POA: Diagnosis not present

## 2018-06-16 DIAGNOSIS — G239 Degenerative disease of basal ganglia, unspecified: Secondary | ICD-10-CM | POA: Diagnosis not present

## 2018-06-20 ENCOUNTER — Other Ambulatory Visit: Payer: Self-pay | Admitting: Physician Assistant

## 2018-06-21 DIAGNOSIS — G239 Degenerative disease of basal ganglia, unspecified: Secondary | ICD-10-CM | POA: Diagnosis not present

## 2018-06-21 DIAGNOSIS — R531 Weakness: Secondary | ICD-10-CM | POA: Diagnosis not present

## 2018-06-21 DIAGNOSIS — R296 Repeated falls: Secondary | ICD-10-CM | POA: Diagnosis not present

## 2018-06-21 DIAGNOSIS — M6281 Muscle weakness (generalized): Secondary | ICD-10-CM | POA: Diagnosis not present

## 2018-06-23 DIAGNOSIS — R531 Weakness: Secondary | ICD-10-CM | POA: Diagnosis not present

## 2018-06-23 DIAGNOSIS — R296 Repeated falls: Secondary | ICD-10-CM | POA: Diagnosis not present

## 2018-06-23 DIAGNOSIS — M6281 Muscle weakness (generalized): Secondary | ICD-10-CM | POA: Diagnosis not present

## 2018-06-23 DIAGNOSIS — G239 Degenerative disease of basal ganglia, unspecified: Secondary | ICD-10-CM | POA: Diagnosis not present

## 2018-06-27 ENCOUNTER — Other Ambulatory Visit: Payer: Self-pay | Admitting: Family Medicine

## 2018-06-28 DIAGNOSIS — R296 Repeated falls: Secondary | ICD-10-CM | POA: Diagnosis not present

## 2018-06-28 DIAGNOSIS — M6281 Muscle weakness (generalized): Secondary | ICD-10-CM | POA: Diagnosis not present

## 2018-06-28 DIAGNOSIS — R531 Weakness: Secondary | ICD-10-CM | POA: Diagnosis not present

## 2018-06-28 DIAGNOSIS — G239 Degenerative disease of basal ganglia, unspecified: Secondary | ICD-10-CM | POA: Diagnosis not present

## 2018-06-29 DIAGNOSIS — L309 Dermatitis, unspecified: Secondary | ICD-10-CM | POA: Diagnosis not present

## 2018-06-29 DIAGNOSIS — L8990 Pressure ulcer of unspecified site, unspecified stage: Secondary | ICD-10-CM | POA: Diagnosis not present

## 2018-06-30 DIAGNOSIS — M6281 Muscle weakness (generalized): Secondary | ICD-10-CM | POA: Diagnosis not present

## 2018-06-30 DIAGNOSIS — G239 Degenerative disease of basal ganglia, unspecified: Secondary | ICD-10-CM | POA: Diagnosis not present

## 2018-06-30 DIAGNOSIS — R531 Weakness: Secondary | ICD-10-CM | POA: Diagnosis not present

## 2018-06-30 DIAGNOSIS — R296 Repeated falls: Secondary | ICD-10-CM | POA: Diagnosis not present

## 2018-07-04 ENCOUNTER — Telehealth: Payer: Self-pay | Admitting: Family Medicine

## 2018-07-04 MED ORDER — ACETAMINOPHEN-CODEINE #3 300-30 MG PO TABS: 1.0000 | ORAL_TABLET | Freq: Three times a day (TID) | ORAL | 0 refills | Status: DC | PRN

## 2018-07-04 NOTE — Telephone Encounter (Signed)
Copied from [place] [phone]. Topic: General - Other >> Jul 04, 2018  7:31 AM Lennox Solders wrote: Reason for CRM:pt husband [name] [name]  is calling and would like a mild pain medication prescribe for his wife. 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 [facility] [address]. [name] [name] dermatologist gave patient new medication Doxepin

## 2018-07-04 NOTE — Addendum Note (Signed)
Addended by: Ria Bush on: 07/04/2018 04:50 PM   Modules accepted: Orders

## 2018-07-04 NOTE — Telephone Encounter (Signed)
[name] [name] (DPR signed)said that pt has tried the Tylenol and it is not helping pain at all. [name] request stronger med than tylenol be sent to [facility] today. Pt is taking Doxepin 10 mg cap at hs. Added to med list. Please advise.

## 2018-07-04 NOTE — Telephone Encounter (Signed)
Spoke with pt's husband, Dellis Filbert (on dpr), relaying Dr. Synthia Innocent message and instructions.  Verbalizes understanding.

## 2018-07-04 NOTE — Telephone Encounter (Addendum)
Ok - may try tylenol with codeine 2-3 times a day as needed for pain - start with 1/2 tablet to see effect, don't take at same time as klonopin.  [place] CSRS reviewed.

## 2018-07-04 NOTE — Telephone Encounter (Signed)
Pt last seen 06/13/18.Please advise.

## 2018-07-04 NOTE — Telephone Encounter (Addendum)
Would start with scheduled tylenol 532m twice daily with meals. Let me know if she's tried this and we can do trial of tramadol stronger pain med.  plz get dose and sig for doxepin and update chart.

## 2018-07-05 ENCOUNTER — Ambulatory Visit: Payer: Self-pay | Admitting: Neurology

## 2018-07-06 ENCOUNTER — Other Ambulatory Visit: Payer: Self-pay | Admitting: Cardiovascular Disease

## 2018-07-06 ENCOUNTER — Other Ambulatory Visit: Payer: Self-pay | Admitting: Family Medicine

## 2018-07-08 DIAGNOSIS — M6281 Muscle weakness (generalized): Secondary | ICD-10-CM | POA: Diagnosis not present

## 2018-07-08 DIAGNOSIS — G239 Degenerative disease of basal ganglia, unspecified: Secondary | ICD-10-CM | POA: Diagnosis not present

## 2018-07-08 DIAGNOSIS — R296 Repeated falls: Secondary | ICD-10-CM | POA: Diagnosis not present

## 2018-07-08 DIAGNOSIS — R531 Weakness: Secondary | ICD-10-CM | POA: Diagnosis not present

## 2018-07-11 DIAGNOSIS — R531 Weakness: Secondary | ICD-10-CM | POA: Diagnosis not present

## 2018-07-11 DIAGNOSIS — M6281 Muscle weakness (generalized): Secondary | ICD-10-CM | POA: Diagnosis not present

## 2018-07-11 DIAGNOSIS — R296 Repeated falls: Secondary | ICD-10-CM | POA: Diagnosis not present

## 2018-07-11 DIAGNOSIS — G239 Degenerative disease of basal ganglia, unspecified: Secondary | ICD-10-CM | POA: Diagnosis not present

## 2018-07-15 DIAGNOSIS — R296 Repeated falls: Secondary | ICD-10-CM | POA: Diagnosis not present

## 2018-07-15 DIAGNOSIS — R531 Weakness: Secondary | ICD-10-CM | POA: Diagnosis not present

## 2018-07-15 DIAGNOSIS — G239 Degenerative disease of basal ganglia, unspecified: Secondary | ICD-10-CM | POA: Diagnosis not present

## 2018-07-15 DIAGNOSIS — M6281 Muscle weakness (generalized): Secondary | ICD-10-CM | POA: Diagnosis not present

## 2018-07-19 DIAGNOSIS — M6281 Muscle weakness (generalized): Secondary | ICD-10-CM | POA: Diagnosis not present

## 2018-07-19 DIAGNOSIS — R531 Weakness: Secondary | ICD-10-CM | POA: Diagnosis not present

## 2018-07-19 DIAGNOSIS — R296 Repeated falls: Secondary | ICD-10-CM | POA: Diagnosis not present

## 2018-07-19 DIAGNOSIS — G239 Degenerative disease of basal ganglia, unspecified: Secondary | ICD-10-CM | POA: Diagnosis not present

## 2018-07-21 DIAGNOSIS — G239 Degenerative disease of basal ganglia, unspecified: Secondary | ICD-10-CM | POA: Diagnosis not present

## 2018-07-21 DIAGNOSIS — R296 Repeated falls: Secondary | ICD-10-CM | POA: Diagnosis not present

## 2018-07-21 DIAGNOSIS — M6281 Muscle weakness (generalized): Secondary | ICD-10-CM | POA: Diagnosis not present

## 2018-07-21 DIAGNOSIS — R531 Weakness: Secondary | ICD-10-CM | POA: Diagnosis not present

## 2018-07-23 ENCOUNTER — Other Ambulatory Visit: Payer: Self-pay | Admitting: Family Medicine

## 2018-07-24 ENCOUNTER — Other Ambulatory Visit: Payer: Self-pay | Admitting: Family Medicine

## 2018-07-25 NOTE — Telephone Encounter (Signed)
Eprescribed.

## 2018-07-25 NOTE — Telephone Encounter (Signed)
Name of Medication: Tyleonl #3 Name of Pharmacy: [facility] Last Fill or Written Date and Quantity: 07/04/18, #15 Last Office Visit and Type: 06/13/18, CPE Next Office Visit and Type: 09/13/18, f/u Last Controlled Substance Agreement Date: none Last UDS: none

## 2018-07-26 DIAGNOSIS — R296 Repeated falls: Secondary | ICD-10-CM | POA: Diagnosis not present

## 2018-07-26 DIAGNOSIS — M6281 Muscle weakness (generalized): Secondary | ICD-10-CM | POA: Diagnosis not present

## 2018-07-26 DIAGNOSIS — R531 Weakness: Secondary | ICD-10-CM | POA: Diagnosis not present

## 2018-07-26 DIAGNOSIS — G239 Degenerative disease of basal ganglia, unspecified: Secondary | ICD-10-CM | POA: Diagnosis not present

## 2018-07-28 ENCOUNTER — Other Ambulatory Visit: Payer: Self-pay | Admitting: Family Medicine

## 2018-07-28 DIAGNOSIS — M6281 Muscle weakness (generalized): Secondary | ICD-10-CM | POA: Diagnosis not present

## 2018-07-28 DIAGNOSIS — R296 Repeated falls: Secondary | ICD-10-CM | POA: Diagnosis not present

## 2018-07-28 DIAGNOSIS — R531 Weakness: Secondary | ICD-10-CM | POA: Diagnosis not present

## 2018-07-28 DIAGNOSIS — G239 Degenerative disease of basal ganglia, unspecified: Secondary | ICD-10-CM | POA: Diagnosis not present

## 2018-07-28 NOTE — Telephone Encounter (Deleted)
Name of Medication: Tyleonl #3 Name of Pharmacy: [facility] Last Fill or Written Date and Quantity:  Last Office Visit and Type:  Next Office Visit and Type:  Last Controlled Substance Agreement Date:  Last UDS:

## 2018-08-02 DIAGNOSIS — G239 Degenerative disease of basal ganglia, unspecified: Secondary | ICD-10-CM | POA: Diagnosis not present

## 2018-08-02 DIAGNOSIS — R296 Repeated falls: Secondary | ICD-10-CM | POA: Diagnosis not present

## 2018-08-02 DIAGNOSIS — M6281 Muscle weakness (generalized): Secondary | ICD-10-CM | POA: Diagnosis not present

## 2018-08-02 DIAGNOSIS — R531 Weakness: Secondary | ICD-10-CM | POA: Diagnosis not present

## 2018-08-03 ENCOUNTER — Telehealth: Payer: Self-pay | Admitting: Neurology

## 2018-08-03 NOTE — Telephone Encounter (Signed)
I called pt to discuss. A female answered the phone and advised me that she is not in right now. I will call back later.

## 2018-08-03 NOTE — Telephone Encounter (Signed)
Pt said she is having problems holding her head up while sitting in the wheelchair. She has been taking therapy but therapist wanted to stop it for awhile to give her neck a rest. Please call to advise

## 2018-08-03 NOTE — Telephone Encounter (Signed)
I am afraid, I really don't have anything else to offer. I would recommend referral to [facility] or [place] or [place], perhaps there are trials or other treatments they may offer. I really don't think a muscle relaxant would help and may increase the risk for side effects. Please call husband back to explain.

## 2018-08-03 NOTE — Telephone Encounter (Signed)
I called pt, spoke to pt's husband, [name], per DPR. He reports that pt is "miserable". She has pain that keeps her up at night, even with a sleeping aid and an anxiety pill. Pt is having a hard time holding her head up and is slouching in her wheelchair. Pt's husband is suggesting a muscle relaxer for the pt to help. Pt's husband is also asking for a sooner appt with Dr. Rexene Alberts that January. I offered pt's husband an appt for pt with Hoyle Sauer, NP on 08/15/18 but pt cannot do any morning appts. I advised pt's husband that I would send his concerns to Dr. Rexene Alberts to find out if she has any other recommendations for this pt.

## 2018-08-04 NOTE — Telephone Encounter (Signed)
I called pt's husband to discuss. No answer, left a message asking him to call me back.

## 2018-08-04 NOTE — Telephone Encounter (Signed)
Pt's husband returned my call and I discussed with him Dr. Guadelupe Sabin recommendations for pt. Pt's husband understands this. He will discuss with pt's PCP a referral to an academic center if this is what they decide. Pt's husband verbalized understanding of Dr. Guadelupe Sabin recommendations.

## 2018-08-10 ENCOUNTER — Ambulatory Visit: Payer: Medicare Other | Admitting: Family Medicine

## 2018-08-10 DIAGNOSIS — Z0289 Encounter for other administrative examinations: Secondary | ICD-10-CM

## 2018-08-12 ENCOUNTER — Other Ambulatory Visit: Payer: Self-pay | Admitting: Family Medicine

## 2018-08-15 ENCOUNTER — Ambulatory Visit (INDEPENDENT_AMBULATORY_CARE_PROVIDER_SITE_OTHER): Payer: Medicare Other | Admitting: Family Medicine

## 2018-08-15 ENCOUNTER — Encounter: Payer: Self-pay | Admitting: Family Medicine

## 2018-08-15 VITALS — BP 122/74 | HR 92 | Temp 98.9°F | Ht 63.0 in

## 2018-08-15 DIAGNOSIS — F331 Major depressive disorder, recurrent, moderate: Secondary | ICD-10-CM

## 2018-08-15 DIAGNOSIS — S0990XA Unspecified injury of head, initial encounter: Secondary | ICD-10-CM | POA: Insufficient documentation

## 2018-08-15 DIAGNOSIS — R209 Unspecified disturbances of skin sensation: Secondary | ICD-10-CM

## 2018-08-15 DIAGNOSIS — G239 Degenerative disease of basal ganglia, unspecified: Secondary | ICD-10-CM | POA: Diagnosis not present

## 2018-08-15 DIAGNOSIS — F5104 Psychophysiologic insomnia: Secondary | ICD-10-CM

## 2018-08-15 DIAGNOSIS — Z23 Encounter for immunization: Secondary | ICD-10-CM

## 2018-08-15 DIAGNOSIS — G903 Multi-system degeneration of the autonomic nervous system: Secondary | ICD-10-CM

## 2018-08-15 DIAGNOSIS — E538 Deficiency of other specified B group vitamins: Secondary | ICD-10-CM

## 2018-08-15 DIAGNOSIS — R296 Repeated falls: Secondary | ICD-10-CM

## 2018-08-15 MED ORDER — TRAZODONE HCL 50 MG PO TABS: 50.0000 mg | ORAL_TABLET | Freq: Every day | ORAL | 3 refills | Status: DC

## 2018-08-15 MED ORDER — CYANOCOBALAMIN 1000 MCG/ML IJ SOLN
1000.0000 ug | Freq: Once | INTRAMUSCULAR | Status: AC
  Administered 2018-08-15: 1000 ug via INTRAMUSCULAR

## 2018-08-15 MED ORDER — TRAZODONE HCL 100 MG PO TABS: 100.0000 mg | ORAL_TABLET | Freq: Every day | ORAL | 3 refills | Status: DC

## 2018-08-15 NOTE — Progress Notes (Signed)
BP 122/74 (BP Location: Left Arm, Patient Position: Sitting, Cuff Size: Normal)   Pulse 92   Temp 98.9 F (37.2 C) (Oral)   Ht 5' 3"  (1.6 m)   SpO2 94%   BMI 25.69 kg/m    CC: f/u visit, fall yesterday Subjective:    Patient ID: [name] [name], female    DOB: 03/22/1941, 77 y.o.   MRN: [id number]  HPI: [name] is a 77 y.o. female presenting on 08/15/2018 for Follow-up (Wants to discuss pain, anxiety and sleep meds. Also, wants to discuss baclofen. Pt accompanied by her husband. Also, wants to discuss urology. ) and Fall (Pt fell at home 08/14/18. Has injury to forhead and face, however states her neck hurts worse than her head. )  Rough weekend - caregivers all were out of town, husband struggled to care for patient at home alone this past weekend.   Had a fall yesterday after husband helped her taking a shower, hit edge of bath tub with R frontal head. Treated with ice pack to forehead, this morning awoke with bruising below eyes. No persistent headache, no nausea, no mentation changes. Has felt well since.   Chronic neck pain which may be worsening - no change after fall. PT has been stopped - concern it was straining neck more.   Palliative care has established with patient but pt hasn't touched base with them recently.   Husband remains frustrated with care at [facility], requests referral to new neurologist.   Relevant past medical, surgical, family and social history reviewed and updated as indicated. Interim medical history since our last visit reviewed. Allergies and medications reviewed and updated. Outpatient Medications Prior to Visit  Medication Sig Dispense Refill  . acetaminophen-codeine (TYLENOL #3) 300-30 MG tablet TAKE 1 TABLET BY MOUTH 3 (THREE) TIMES DAILY AS NEEDED FOR MODERATE PAIN. 15 tablet 0  . albuterol (PROVENTIL HFA;VENTOLIN HFA) 108 (90 Base) MCG/ACT inhaler TAKE 2 PUFFS BY MOUTH EVERY 6 HOURS AS NEEDED FOR WHEEZE OR SHORTNESS OF BREATH 8.5 Inhaler 0  .  aspirin EC 81 MG tablet Take 1 tablet (81 mg total) by mouth daily.    . bisacodyl (DULCOLAX) 10 MG suppository Place 10 mg rectally as needed for mild constipation or moderate constipation.    . Cholecalciferol (VITAMIN D3) 1000 units CAPS Take 1 capsule (1,000 Units total) by mouth daily. 30 capsule   . clonazePAM (KLONOPIN) 1 MG tablet TAKE 0.5-1 TABLETS (0.5-1 MG TOTAL) BY MOUTH DAILY AS NEEDED FOR ANXIETY. 30 tablet 3  . cyanocobalamin (,VITAMIN B-12,) 1000 MCG/ML injection Inject 1 mL (1,000 mcg total) into the muscle every 30 (thirty) days. 1 mL 0  . docusate sodium (COLACE) 100 MG capsule Take 300 mg by mouth daily.     Marland Kitchen doxepin (SINEQUAN) 10 MG capsule Take 10 mg by mouth at bedtime.    Marland Kitchen esomeprazole (NEXIUM) 40 MG capsule Take 1 capsule (40 mg total) by mouth daily. 30 capsule 6  . famotidine (PEPCID) 40 MG tablet TAKE 1 TABLET (40 MG TOTAL) BY MOUTH 2 (TWO) TIMES DAILY AS NEEDED FOR HEARTBURN. 180 tablet 3  . furosemide (LASIX) 20 MG tablet TAKE 1 TABLET (20 MG TOTAL) BY MOUTH DAILY AS NEEDED FOR FLUID OR EDEMA. 30 tablet 0  . hyoscyamine (LEVSIN SL) 0.125 MG SL tablet TAKE ONE TABLET DISSOLVED UNDER TONGUE UP TO TWICE A DAY FOR ABDOMINAL OR SIDE PAIN 30 tablet 0  . isosorbide mononitrate (IMDUR) 30 MG 24 hr tablet TAKE 1/2  TABLET BY MOUTH DAILY 15 tablet 9  . levothyroxine (SYNTHROID) 100 MCG tablet Take 1 tablet (100 mcg total) by mouth daily before breakfast. 30 tablet 11  . MYRBETRIQ 50 MG TB24 tablet Take 50 mg by mouth daily.  11  . nitroGLYCERIN (NITROSTAT) 0.4 MG SL tablet Place 1 tablet (0.4 mg total) under the tongue every 5 (five) minutes as needed (chest pain). 25 tablet 3  . propranolol (INDERAL) 10 MG tablet TAKE 1 TABLET BY MOUTH 3 TIMES DAILY AS NEEDED (TREMORS). 180 tablet 0  . rosuvastatin (CRESTOR) 20 MG tablet TAKE 0.5 TABLETS (10 MG TOTAL) BY MOUTH DAILY. 45 tablet 2  . trimethoprim (TRIMPEX) 100 MG tablet Take 100 mg by mouth daily.  11  . sertraline (ZOLOFT) 25  MG tablet TAKE 1 TABLET (25 MG TOTAL) BY MOUTH DAILY. 90 tablet 0  . traZODone (DESYREL) 50 MG tablet Take 0.5-1 tablets (25-50 mg total) by mouth at bedtime as needed for sleep. 30 tablet 3   No facility-administered medications prior to visit.      Per HPI unless specifically indicated in ROS section below Review of Systems     Objective:    BP 122/74 (BP Location: Left Arm, Patient Position: Sitting, Cuff Size: Normal)   Pulse 92   Temp 98.9 F (37.2 C) (Oral)   Ht 5' 3"  (1.6 m)   SpO2 94%   BMI 25.69 kg/m   Wt Readings from Last 3 Encounters:  06/14/17 145 lb (65.8 kg)  04/14/17 145 lb (65.8 kg)  03/07/17 145 lb (65.8 kg)    Physical Exam  Constitutional:  Sitting in wheelchair  HENT:  Head: Head is with contusion. Head is without Battle's sign.  Right Ear: No hemotympanum.  Left Ear: No hemotympanum.  R frontal hematoma Periorbital ecchymosis R>L No tenderness of eyebrow or periorbital region bilaterally  Eyes: Pupils are equal, round, and reactive to light. EOM are normal.  Small R subconjunctival hemorrhage  Neck: Neck supple. No spinous process tenderness and no muscular tenderness present. Decreased range of motion present.  Cardiovascular: Normal rate, regular rhythm and normal heart sounds.  No murmur heard. Neurological: She is alert. No cranial nerve deficit or sensory deficit.  CN 2-12 seem intact  Nursing note and vitals reviewed.      Assessment & Plan:   Problem List Items Addressed This Visit    Vitamin B12 deficiency    b12 shot today.       Relevant Medications   cyanocobalamin ((VITAMIN B-12)) injection 1,000 mcg (Completed)   Skin sensation disturbance    Low dose doxepin per derm helping this.       Recurrent falls    Confined to WC. High risk.       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 Husband struggling with caregiver burden and home care. I recommended they touch base with hospice/palliative  care for more comprehensive support now that they have stopped physical therapy.  Pt and husband aware of the progressive incurable nature of this terminal condition, but don't feel supported by current care at [facility]. Per their request, will refer to [facility] neurology to see if they can establish care there.      MDD (major depressive disorder)    Stop sertraline, increase trazodone to 12m nightly.       Relevant Medications   traZODone (DESYREL) 100 MG tablet   Head injury    She has hematoma to R frontal skull without  surrounding tenderness. Anticipate periorbital ecchymosis due to blood travel due to gravity. No other signs/symptoms concerning for head bleed or fracture - will defer head imaging at this time. Reviewed red flags to notify us immediately or seek urgent care.      Chronic insomnia    Increase trazodone to 157m nightly.        Other Visit Diagnoses    Need for influenza vaccination       Relevant Orders   Flu Vaccine QUAD 36+ mos IM (Completed)       Meds ordered this encounter  Medications  . cyanocobalamin ((VITAMIN B-12)) injection 1,000 mcg  . DISCONTD: traZODone (DESYREL) 50 MG tablet    Sig: Take 1-2 tablets (50-100 mg total) by mouth at bedtime.    Dispense:  60 tablet    Refill:  3  . traZODone (DESYREL) 100 MG tablet    Sig: Take 1 tablet (100 mg total) by mouth at bedtime.    Dispense:  30 tablet    Refill:  3    Note new sig   Orders Placed This Encounter  Procedures  . Flu Vaccine QUAD 36+ mos IM    Follow up plan: No follow-ups on file.  JRia Bush MD

## 2018-08-15 NOTE — Patient Instructions (Addendum)
Flu shot today Vitamin b12 shot today We will refer you to [facility] Neurology.  Stop sertraline. Increase trazodone to 47m 1-2 tablets at bedtime.  Ok to continue tylenol with codeine as needed for pain - let me know when you're running low and we will refill.

## 2018-08-15 NOTE — Assessment & Plan Note (Signed)
Stop sertraline, increase trazodone to 180m nightly.

## 2018-08-15 NOTE — Assessment & Plan Note (Signed)
She has hematoma to R frontal skull without surrounding tenderness. Anticipate periorbital ecchymosis due to blood travel due to gravity. No other signs/symptoms concerning for head bleed or fracture - will defer head imaging at this time. Reviewed red flags to notify us immediately or seek urgent care.

## 2018-08-15 NOTE — Assessment & Plan Note (Signed)
b12 shot today.

## 2018-08-15 NOTE — Assessment & Plan Note (Signed)
Increase trazodone to 176m nightly.

## 2018-08-15 NOTE — Assessment & Plan Note (Signed)
Continued progression noted. Husband struggling with caregiver burden and home care. I recommended they touch base with hospice/palliative care for more comprehensive support now that they have stopped physical therapy.  Pt and husband aware of the progressive incurable nature of this terminal condition, but don't feel supported by current care at [facility]. Per their request, will refer to [facility] neurology to see if they can establish care there.

## 2018-08-15 NOTE — Assessment & Plan Note (Signed)
Low dose doxepin per derm helping this.

## 2018-08-15 NOTE — Assessment & Plan Note (Signed)
Confined to WC. High risk.

## 2018-08-24 ENCOUNTER — Telehealth: Payer: Self-pay | Admitting: Family Medicine

## 2018-08-24 DIAGNOSIS — G903 Multi-system degeneration of the autonomic nervous system: Secondary | ICD-10-CM | POA: Diagnosis not present

## 2018-08-24 DIAGNOSIS — G239 Degenerative disease of basal ganglia, unspecified: Principal | ICD-10-CM

## 2018-08-24 DIAGNOSIS — R531 Weakness: Secondary | ICD-10-CM

## 2018-08-24 NOTE — Telephone Encounter (Signed)
Received a call from Palliative Care. The N.P. Saw the patient today and is recommending a  Hospice Referral now. Husband is aware. Palliative Care from [place] will call [facility] to let them know that the Referral is coming. [facility] form is in your in-box to fill out and sign.

## 2018-08-24 NOTE — Telephone Encounter (Signed)
Noted. Thank you. Form filled and in my out box.

## 2018-08-25 ENCOUNTER — Other Ambulatory Visit: Payer: Self-pay | Admitting: Family Medicine

## 2018-08-25 ENCOUNTER — Telehealth: Payer: Self-pay

## 2018-08-25 NOTE — Telephone Encounter (Signed)
E perscribed

## 2018-08-25 NOTE — Telephone Encounter (Signed)
Hospice Referral faxed to [facility].

## 2018-08-25 NOTE — Telephone Encounter (Signed)
Noted  

## 2018-08-25 NOTE — Telephone Encounter (Signed)
Copied from [place] [id number]. Topic: General - Inquiry >> Aug 25, 2018  4:24 PM Tye [place] wrote: Reason for CRM: Hospice and palliative care [facility] called to inform the family is not ready to accept hospice at this time and want to continue palliative care and consider further on options; contact if needed [phone]

## 2018-08-25 NOTE — Telephone Encounter (Signed)
See phone note 08/24/18.

## 2018-08-25 NOTE — Telephone Encounter (Signed)
Name of Medication: Tylenol #3 Name of Pharmacy: [facility] Last Fill or Written Date and Quantity: 07/26/18, #15 Last Office Visit and Type: 08/15/18, acute Next Office Visit and Type: 09/13/18, f/u Last Controlled Substance Agreement Date: none Last UDS: none

## 2018-08-27 ENCOUNTER — Other Ambulatory Visit: Payer: Self-pay | Admitting: Family Medicine

## 2018-08-27 NOTE — Telephone Encounter (Signed)
Denied - #30 refilled 08/25/2018.

## 2018-08-29 ENCOUNTER — Encounter: Payer: Self-pay | Admitting: Neurology

## 2018-08-29 NOTE — Telephone Encounter (Signed)
Called patients husband and he confirmed that they are not ready for Hospice at this time and would like to continue with the Palliative Care. I called Denise from [facility] and told her that the family did not want Hospice and wanted to continue with Palliative Care. She will call [facility] and will let them know that they will continue Palliative Care for the patient.

## 2018-08-29 NOTE — Addendum Note (Signed)
Addended by: Ria Bush on: 08/29/2018 08:39 AM   Modules accepted: Orders

## 2018-09-07 ENCOUNTER — Other Ambulatory Visit: Payer: Self-pay | Admitting: Family Medicine

## 2018-09-08 ENCOUNTER — Other Ambulatory Visit: Payer: Self-pay | Admitting: Family Medicine

## 2018-09-08 NOTE — Progress Notes (Signed)
[name] [name] was seen today in the movement disorders clinic for neurologic consultation at the request of Ria Bush, MD.  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  The patient has seen several other neurologists for this, including Dr. Rexene Alberts, Dr. Gilford Rile at [facility] (2013) and sought an opinion at the [facility] in 2013.  I have notes from Dr. Rexene Alberts and Dr. Gilford Rile.  Notes from [facility] are unavailable.  Patient was seen in the early 31s for essential tremor by Dr. Erling Cruz.  This was a head tremor, later followed by hand tremor.  This stayed fairly stable until approximately 2011, when the patient began to have problems with falling and gait instability.  She began to fall backwards.  She began to have more difficulty with her right leg.  When she was seen in 2013 by Dr. Gilford Rile, he felt that she had lower extremity Parkinson's disease and recommended starting her on levodopa.  She did not tolerate levodopa well, having significant nausea with this.  She was subsequently seen at the [facility] per Dr. 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  She had an abnormal sweat test.  She had a negative tilt table test.  She had negative paraneoplastic antibodies.  Negative anti-gad antibodies.  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  Therapy since that time has really been supportive therapy.  She was last seen by Dr. Rexene Alberts on May 23, 2018.  She has been struggling with constipation.  She uses clonazepam for insomnia, prescribed by her primary care physician.  She sometimes takes one in the day as well for tremor.  In addition, her primary care physician just increased her trazodone to 100 mg at night on October 7.  Specific Symptoms:  Tremor: Yes.   Family hx of similar:  No. Voice: softer Sleep: trouble staying asleep.  Some due to neck and back pain.  Sleeping in a lift chair.  Has hospital bed but cannot lay flat  in it because of flexion of neck.    Vivid Dreams:  No.  Acting out dreams:  No. Wet Pillows: No. Postural symptoms:  Does not ambulate x 2 years Bradykinesia symptoms: slow movements and drooling while awake Loss of smell:  No. Loss of taste:  No. Urinary Incontinence:  Yes.  , uses undergarments but will often make it to the bathroom Difficulty Swallowing:  No. (occasionally with meds) Trouble with ADL's:  Yes.   (has caregivers from 8am-10pm, 7 days per week)  Trouble buttoning clothing: Yes.   Depression:  Yes.   (crying spontaneously and cries because sad - worse since sertraline was d/c - have appt with Ria Bush, MD next week) Memory changes:  No. Hallucinations:  Yes.   ? at night will feel bugs and had to go to dermatology (nothing found)  visual distortions: No. N/V:  No. Lightheaded:  No.  Syncope: No. Diplopia:  No. Dyskinesia:  No. Prior exposure to reglan/antipsychotics: No.  Patient's last MRI of the brain was April 14, 2017.  I reviewed this.  It was essentially unremarkable.  PREVIOUS MEDICATIONS: Sinemet  ALLERGIES:   Allergies  Allergen Reactions  . Keflex [Cephalexin] Diarrhea    CURRENT MEDICATIONS:  Outpatient Encounter Medications as of 09/09/2018  Medication Sig  . acetaminophen-codeine (TYLENOL #3) 300-30 MG tablet Take 1 tablet by mouth 2 (two) times daily as needed for moderate  pain.  . albuterol (PROVENTIL HFA;VENTOLIN HFA) 108 (90 Base) MCG/ACT inhaler TAKE 2 PUFFS BY MOUTH EVERY 6 HOURS AS NEEDED FOR WHEEZE OR SHORTNESS OF BREATH  . aspirin EC 81 MG tablet Take 1 tablet (81 mg total) by mouth daily.  . bisacodyl (DULCOLAX) 10 MG suppository Place 10 mg rectally as needed for mild constipation or moderate constipation.  . Cholecalciferol (VITAMIN D3) 1000 units CAPS Take 1 capsule (1,000 Units total) by mouth daily.  . clonazePAM (KLONOPIN) 1 MG tablet TAKE 0.5-1 TABLETS (0.5-1 MG TOTAL) BY MOUTH DAILY AS NEEDED FOR ANXIETY.  .  cyanocobalamin (,VITAMIN B-12,) 1000 MCG/ML injection Inject 1 mL (1,000 mcg total) into the muscle every 30 (thirty) days.  Marland Kitchen docusate sodium (COLACE) 100 MG capsule Take 300 mg by mouth daily.   Marland Kitchen doxepin (SINEQUAN) 10 MG capsule Take 10 mg by mouth at bedtime.  Marland Kitchen esomeprazole (NEXIUM) 40 MG capsule Take 1 capsule (40 mg total) by mouth daily.  . famotidine (PEPCID) 40 MG tablet TAKE 1 TABLET (40 MG TOTAL) BY MOUTH 2 (TWO) TIMES DAILY AS NEEDED FOR HEARTBURN.  . furosemide (LASIX) 20 MG tablet TAKE 1 TABLET (20 MG TOTAL) BY MOUTH DAILY AS NEEDED FOR FLUID OR EDEMA.  . hyoscyamine (LEVSIN SL) 0.125 MG SL tablet TAKE ONE TABLET DISSOLVED UNDER TONGUE UP TO TWICE A DAY FOR ABDOMINAL OR SIDE PAIN  . isosorbide mononitrate (IMDUR) 30 MG 24 hr tablet TAKE 1/2 TABLET BY MOUTH DAILY  . levothyroxine (SYNTHROID) 100 MCG tablet Take 1 tablet (100 mcg total) by mouth daily before breakfast.  . MYRBETRIQ 50 MG TB24 tablet Take 50 mg by mouth daily.  . nitroGLYCERIN (NITROSTAT) 0.4 MG SL tablet Place 1 tablet (0.4 mg total) under the tongue every 5 (five) minutes as needed (chest pain).  . propranolol (INDERAL) 10 MG tablet TAKE 1 TABLET BY MOUTH 3 TIMES DAILY AS NEEDED (TREMORS).  Marland Kitchen rosuvastatin (CRESTOR) 20 MG tablet TAKE 0.5 TABLETS (10 MG TOTAL) BY MOUTH DAILY.  . traZODone (DESYREL) 100 MG tablet TAKE 1 TABLET BY MOUTH EVERYDAY AT BEDTIME  . trimethoprim (TRIMPEX) 100 MG tablet Take 100 mg by mouth daily.   No facility-administered encounter medications on file as of 09/09/2018.     PAST MEDICAL HISTORY:   Past Medical History:  Diagnosis Date  . Asthma   . CAD (coronary artery disease)    myoview 5/08:  EF 76%, no scar, no ischemia, +ECG changes with exercise;  cath 04/08/07:  pLAD 30%, mLAD 60-70% - med tx.  . Chronic cystitis    recurrent UTIs, started bactrim ppx (McDiarmid)  . Depression   . Familial tremor    followed by Dr. Erling Cruz  . GERD (gastroesophageal reflux disease)    s/p nissen    . History of pneumonia   . HLD (hyperlipidemia)   . Hydronephrosis, bilateral   . Hypothyroidism   . Internal hemorrhoids   . 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 Neurogenic bladder    Tannenbaum/MacDiarmid  . Osteoporosis 2016   DEXA T-4.4 spine deteriorated since 2012  . Unspecified chronic bronchitis ([place])   . VAGINITIS, ATROPHIC 11/07/2007    PAST SURGICAL HISTORY:   Past Surgical History:  Procedure Laterality Date  . COLONOSCOPY N/A 08/29/2013   int hemm; Inda Castle, MD  . CYSTECTOMY    . DEXA  12/2014   T -4.4 spine  . ESOPHAGOGASTRODUODENOSCOPY N/A 01/09/2015   Procedure: ESOPHAGOGASTRODUODENOSCOPY (EGD);  Surgeon:  Inda Castle, MD;  Location: [facility];  Service: Endoscopy;  Laterality: N/A;  help with transfers  . LAPAROSCOPIC NISSEN FUNDOPLICATION    . TUBAL LIGATION    . TUMOR REMOVAL      SOCIAL HISTORY:   Social History   Socioeconomic History  . Marital status: Married    Spouse name: [name]  . Number of children: 2  . Years of education: HS  . Highest education level: Not on file  Occupational History    Employer: OTHER  Social Needs  . Financial resource strain: Not on file  . Food insecurity:    Worry: Not on file    Inability: Not on file  . Transportation needs:    Medical: Not on file    Non-medical: Not on file  Tobacco Use  . Smoking status: Never Smoker  . Smokeless tobacco: Never Used  Substance and Sexual Activity  . Alcohol use: No    Alcohol/week: 0.0 standard drinks  . Drug use: No  . Sexual activity: Not Currently  Lifestyle  . Physical activity:    Days per week: Not on file    Minutes per session: Not on file  . Stress: Not on file  Relationships  . Social connections:    Talks on phone: Not on file    Gets together: Not on file    Attends religious service: Not on file    Active member of club or organization: Not on file    Attends meetings of clubs or organizations: Not on file     Relationship status: Not on file  . Intimate partner violence:    Fear of current or ex partner: Not on file    Emotionally abused: Not on file    Physically abused: Not on file    Forced sexual activity: Not on file  Other Topics Concern  . Not on file  Social History Narrative   HSG, retired from [employer] . married '60. 1 son - '65; 1 daughter - '61; 2 grandchildren; 1 great-grandchild. Homemaker. marriage in good health. 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 Positive history of passive tobacco smoke exposure.   Caffeine Use: 1 cup daily; 2 glasses of tea daily    FAMILY HISTORY:   Family Status  Relation Name Status  . Mother  [place]  . Father  Deceased at age 52  . MGM  Deceased  . MGF  Deceased  . PGM  Deceased  . PGF  Deceased  . Daughter  (Not Specified)  . Mat Aunt  (Not Specified)  . Daughter  (Not Specified)  . Mat Aunt  (Not Specified)  . Daughter  (Not Specified)  . Daughter  (Not Specified)  . Daughter  (Not Specified)    ROS:  Review of Systems  Constitutional: Negative.   HENT: Negative.   Respiratory: Negative.   Gastrointestinal: Positive for constipation.  Genitourinary: Negative.   Musculoskeletal: Positive for back pain and neck pain.  Skin: Negative.   Neurological: Positive for tremors.  Endo/Heme/Allergies: Negative.     PHYSICAL EXAMINATION:    VITALS:   Vitals:   09/09/18 1320  BP: 130/70  Pulse: 95  Resp: 12    GEN:  The patient appears stated age and is in NAD. HEENT:  Normocephalic, atraumatic.  The mucous membranes are moist. The superficial temporal arteries are without ropiness or tenderness.  There is significant drooling. CV:  RRR Lungs:  CTAB Neck/HEME:  There are  no carotid bruits bilaterally.  The neck is flexed.  The chin is on the chest. Musculoskeletal: There is significant tightness of the bilateral cervical paraspinal musculature.  The same is true with trapezius muscles.  Neurological  examination:  Orientation: The patient is alert and oriented x3. Fund of knowledge is appropriate.  Recent and remote memory are intact.  Attention and concentration are normal.    Able to name objects and repeat phrases. Cranial nerves: There is good facial symmetry.  There is facial hypomimia.  Pupils are equal round and reactive to light bilaterally. Fundoscopic exam reveals clear margins bilaterally. Extraocular muscles are intact. The visual fields are full to confrontational testing. The speech is fluent but bulbar and slightly dysarthric. Soft palate rises symmetrically and there is no tongue deviation. Hearing is intact to conversational tone. Sensation: Sensation is intact to light and pinprick throughout (facial, trunk, extremities). There is no extinction with double simultaneous stimulation. There is no sensory dermatomal level identified. Motor: Strength is at least 5-/5 in the left upper extremity.  She initially told me she could not move the right upper extremity, but with encouragement she was able to abduct the shoulder and strength in the right upper extremity was at least 3+-4-.  She did have trouble opening the hand on the right with slight decreased grasp on the right.  Shoulder shrug is equal and symmetric.   Deep tendon reflexes: Not tested  Movement examination: Tone: There is moderate increased tone in the right upper extremity and right lower extremity.  There is mild to moderate increased tone in the left upper and lower extremity. Abnormal movements: There is very mild intermittent tremor in the right upper extremity. Coordination:  There is slowness with all rapid alternating movements bilaterally. Gait and Station: The patient is unable to ambulate and is in a wheelchair  ASSESSMENT/PLAN:  1.  Multiple system atrophy, at least since 2013  -We talked about nature, etiology and pathophysiology. We talked about how the symptoms, course and prognosis differ from  Parkinson's Disease.  We talked about the risks, particularly for falls and aspiration.  We talked about the care PSP group.  The patient's husband is already familiar with this group.  -Hospice has just been out to see the patient and recommended hospice for the patient.  However, the family was unwilling to accept this service.  They wanted to continue palliative care.  -Talked about goals of care.  Initially, they stated that she was DNR, but then stated that she was not.  We will have my social worker call them on Monday to see if we could get a copy of their advanced directives and if they do not have them, get advanced directives in place.  -Information given on the atypical support group.  They do have full-time daytime caregiving in place  -Husband asked me how sure I am of the diagnosis.  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  She has some of the hallmark features including the chin on the chest.  -Husband asked about trying to limit her medications.  I do not think she needs her Inderal if she was just using it for tremor.  It appears that that was prescribed by Dr. Erling Cruz many years ago.  She is only using it 10 mg as needed.  I think they can discontinue that.  2.  Sialorrhea  -This is commonly associated with PD.  We talked about treatments.  The patient is not a candidate for oral  anticholinergic therapy because of increased risk of confusion and falls.  We discussed Botox (type A and B) and 1% atropine drops.  We discusssed that candy like lemon drops can help by stimulating mm of the oropharynx to induce swallowing.  They will let me know if they would like to proceed with Botox.  Her husband will think about that.  3.  Neck pain with significant anterior flexion/anterocollis  -I would not necessarily recommend Botox for the anterocollis because of risk of dysphagia, but she does have hypertrophy of the cervical paraspinal muscles posteriorly.  We could try Botox there to see if that  would help her neck pain.  I did tell her husband that this could worsen the anterocollis.  They are going to think about whether or not they would like to proceed with that.  4.  Depression and PBA  -They have an appointment on Tuesday with Dr. Danise Mina as the patient would like to go back on her sertraline.  5.  Dysphagia  -We will schedule a modified barium swallow  6.  Follow-up in the next 5 months, sooner should new neurologic issues arise.  Greater than 50% of the 60-minute visit spent in counseling with patient and her husband.  This did not include the 40 min of record review which was detailed above, which was non face to face time.   Cc:  Ria Bush, MD

## 2018-09-09 ENCOUNTER — Encounter: Payer: Self-pay | Admitting: Neurology

## 2018-09-09 ENCOUNTER — Other Ambulatory Visit (HOSPITAL_COMMUNITY): Payer: Self-pay | Admitting: Neurology

## 2018-09-09 ENCOUNTER — Ambulatory Visit: Payer: Medicare Other | Admitting: Neurology

## 2018-09-09 VITALS — BP 130/70 | HR 95 | Resp 12

## 2018-09-09 DIAGNOSIS — R1310 Dysphagia, unspecified: Secondary | ICD-10-CM

## 2018-09-09 DIAGNOSIS — F482 Pseudobulbar affect: Secondary | ICD-10-CM

## 2018-09-09 DIAGNOSIS — G232 Striatonigral degeneration: Secondary | ICD-10-CM | POA: Diagnosis not present

## 2018-09-09 DIAGNOSIS — K117 Disturbances of salivary secretion: Secondary | ICD-10-CM

## 2018-09-09 DIAGNOSIS — M436 Torticollis: Secondary | ICD-10-CM | POA: Diagnosis not present

## 2018-09-09 NOTE — Patient Instructions (Addendum)
Let me know if you would like to schedule botox for the drooling and we will do this.  It will need to be a separate appointment than your regular follow up appointment.  We have scheduled you at [facility] for your modified barium swallow on 09/14/18 at 11:30 am. Please arrive 15 minutes prior and go to 1st floor radiology. If you need to reschedule for any reason please call [phone].

## 2018-09-09 NOTE — Telephone Encounter (Addendum)
Please address in Dr. Synthia Innocent absence.  Name of Medication: Clonazepam Name of Pharmacy: [facility] Last Fill or Written Date and Quantity: 08/16/18, #30 Last Office Visit and Type: 08/15/18, f/u Next Office Visit and Type: 09/13/18, f/u Last Controlled Substance Agreement Date: none Last UDS: none

## 2018-09-11 NOTE — Telephone Encounter (Signed)
Sent. Thanks.   

## 2018-09-12 ENCOUNTER — Telehealth: Payer: Self-pay | Admitting: Psychology

## 2018-09-12 ENCOUNTER — Telehealth: Payer: Self-pay
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
## 2018-09-12 NOTE — Telephone Encounter (Signed)
Spoke with husband and made him aware that r/x was sent in yesterday.  He is going to pharmacy today to pick up.

## 2018-09-12 NOTE — Progress Notes (Signed)
BP 120/84 (BP Location: Left Arm, Patient Position: Sitting, Cuff Size: Normal)   Pulse 60   Temp 97.6 F (36.4 C) (Oral)   Ht 5' 3"  (1.6 m)   SpO2 100%   BMI 25.69 kg/m    CC: f/u visit Subjective:    Patient ID: [name] [name], female    DOB: 1941-01-08, 77 y.o.   MRN: [id number]  HPI: [name] [name] is a 77 y.o. female presenting on 09/13/2018 for Follow-up (Here for 3 mo f/u.  Pt accompanied by her husband, [name]. ) and Discuss Mediciation (Pt's husband wants to discuss sertraline and a muscle relaxer. Says pt is not sleeping and crying often. Also, c/o back pain due to certain positions she stays in. Neurology suggested pt stop tremor med. )   See prior note for details.  Saw Dr Tat as second opinion for MSA. Appreciate her care. Pt/husband appreciative. Upcoming swallow study next month. Considering attending local support group.   Still not sleeping well despite trazodone 158m.  Since stopping sertraline, husband has noted worsening mood swings, more crying episodes, difficulty getting along with husband. They both request restarting sertraline.   Worsening posture last several days. Ongoing neck pain described as burning type pain attributable to posture. Would like to try muscle relaxant.  Palliative care involved, patient not ready to accept hospice care at this time.   Relevant past medical, surgical, family and social history reviewed and updated as indicated. Interim medical history since our last visit reviewed. Allergies and medications reviewed and updated. Outpatient Medications Prior to Visit  Medication Sig Dispense Refill  . acetaminophen-codeine (TYLENOL #3) 300-30 MG tablet Take 1 tablet by mouth 2 (two) times daily as needed for moderate pain. 30 tablet 0  . albuterol (PROVENTIL HFA;VENTOLIN HFA) 108 (90 Base) MCG/ACT inhaler TAKE 2 PUFFS BY MOUTH EVERY 6 HOURS AS NEEDED FOR WHEEZE OR SHORTNESS OF BREATH 8.5 Inhaler 0  . aspirin EC 81 MG tablet Take 1 tablet  (81 mg total) by mouth daily.    . bisacodyl (DULCOLAX) 10 MG suppository Place 10 mg rectally as needed for mild constipation or moderate constipation.    . Cholecalciferol (VITAMIN D3) 1000 units CAPS Take 1 capsule (1,000 Units total) by mouth daily. 30 capsule   . clonazePAM (KLONOPIN) 1 MG tablet TAKE 0.5-1 TABLETS (0.5-1 MG TOTAL) BY MOUTH DAILY AS NEEDED FOR ANXIETY. 30 tablet 0  . cyanocobalamin (,VITAMIN B-12,) 1000 MCG/ML injection Inject 1 mL (1,000 mcg total) into the muscle every 30 (thirty) days. 1 mL 0  . docusate sodium (COLACE) 100 MG capsule Take 300 mg by mouth daily.     .Marland Kitchendoxepin (SINEQUAN) 10 MG capsule Take 10 mg by mouth at bedtime.    .Marland Kitchenesomeprazole (NEXIUM) 40 MG capsule Take 1 capsule (40 mg total) by mouth daily. 30 capsule 6  . famotidine (PEPCID) 40 MG tablet TAKE 1 TABLET (40 MG TOTAL) BY MOUTH 2 (TWO) TIMES DAILY AS NEEDED FOR HEARTBURN. 180 tablet 3  . furosemide (LASIX) 20 MG tablet TAKE 1 TABLET (20 MG TOTAL) BY MOUTH DAILY AS NEEDED FOR FLUID OR EDEMA. 30 tablet 0  . hyoscyamine (LEVSIN SL) 0.125 MG SL tablet TAKE ONE TABLET DISSOLVED UNDER TONGUE UP TO TWICE A DAY FOR ABDOMINAL OR SIDE PAIN 30 tablet 0  . isosorbide mononitrate (IMDUR) 30 MG 24 hr tablet TAKE 1/2 TABLET BY MOUTH DAILY 15 tablet 9  . levothyroxine (SYNTHROID) 100 MCG tablet Take 1 tablet (100 mcg total)  by mouth daily before breakfast. 30 tablet 11  . MYRBETRIQ 50 MG TB24 tablet Take 50 mg by mouth daily.  11  . nitroGLYCERIN (NITROSTAT) 0.4 MG SL tablet Place 1 tablet (0.4 mg total) under the tongue every 5 (five) minutes as needed (chest pain). 25 tablet 3  . propranolol (INDERAL) 10 MG tablet TAKE 1 TABLET BY MOUTH 3 TIMES DAILY AS NEEDED (TREMORS). 180 tablet 0  . rosuvastatin (CRESTOR) 20 MG tablet TAKE 0.5 TABLETS (10 MG TOTAL) BY MOUTH DAILY. 45 tablet 2  . traZODone (DESYREL) 100 MG tablet TAKE 1 TABLET BY MOUTH EVERYDAY AT BEDTIME 90 tablet 0  . trimethoprim (TRIMPEX) 100 MG tablet Take  100 mg by mouth daily.  11   No facility-administered medications prior to visit.      Per HPI unless specifically indicated in ROS section below Review of Systems     Objective:    BP 120/84 (BP Location: Left Arm, Patient Position: Sitting, Cuff Size: Normal)   Pulse 60   Temp 97.6 F (36.4 C) (Oral)   Ht 5' 3"  (1.6 m)   SpO2 100%   BMI 25.69 kg/m   Wt Readings from Last 3 Encounters:  06/14/17 145 lb (65.8 kg)  04/14/17 145 lb (65.8 kg)  03/07/17 145 lb (65.8 kg)    Physical Exam  Constitutional: She appears well-developed and well-nourished. No distress.  Slumped over in wheelchair Unable to actively sit upright, but able to passively be brought to upright position.  HENT:  Mouth/Throat: Oropharynx is clear and moist. No oropharyngeal exudate.  Cardiovascular: Normal rate, regular rhythm and normal heart sounds.  No murmur heard. Pulmonary/Chest: Effort normal and breath sounds normal. No respiratory distress. She has no wheezes. She has no rales.  Musculoskeletal: She exhibits no edema.  Psychiatric: She exhibits a depressed mood.  Nursing note and vitals reviewed.  Results for orders placed or performed in visit on 06/01/18  Hepatic function panel  Result Value Ref Range   Total Bilirubin 0.4 0.2 - 1.2 mg/dL   Bilirubin, Direct 0.1 0.0 - 0.3 mg/dL   Alkaline Phosphatase 105 39 - 117 U/L   AST 13 0 - 37 U/L   ALT 15 0 - 35 U/L   Total Protein 6.8 6.0 - 8.3 g/dL   Albumin 4.0 3.5 - 5.2 g/dL  VITAMIN D 25 Hydroxy (Vit-D Deficiency, Fractures)  Result Value Ref Range   VITD 35.42 30.00 - 100.00 ng/mL  Vitamin B12  Result Value Ref Range   Vitamin B-12 >1500 (H) 211 - 911 pg/mL  T4, free  Result Value Ref Range   Free T4 1.71 (H) 0.60 - 1.60 ng/dL  TSH  Result Value Ref Range   TSH 0.44 0.35 - 4.50 uIU/mL  Lipid panel  Result Value Ref Range   Cholesterol 135 0 - 200 mg/dL   Triglycerides 137.0 0.0 - 149.0 mg/dL   HDL 49.80 >39.00 mg/dL   VLDL 27.4 0.0  - 40.0 mg/dL   LDL Cholesterol 58 0 - 99 mg/dL   Total CHOL/HDL Ratio 3    NonHDL 85.03    *Note: Due to a large number of results and/or encounters for the requested time period, some results have not been displayed. A complete set of results can be found in Results Review.       Assessment & Plan:   Problem List Items Addressed This Visit    Tremor due to disorder of central nervous system    She is on  low dose propranolol. Suggested trial off propranolol as recommended by neurology. Will continue klonopin at this time to help tremor and anxiety.       Skin sensation disturbance    Takes low dose doxepin for this per derm, beneficial.       Neck pain    Trial robaxin low dose muscle relaxant. Unsure how helpful this will be.       Multiple system atrophy ([place]) - Primary   MDD (major depressive disorder)    We all agree to retrial sertraline - will start at 21m daily in am. Will continue trazodone 1057mnightly. RTC 1 mo f/u visit.       Relevant Medications   sertraline (ZOLOFT) 25 MG tablet       Meds ordered this encounter  Medications  . sertraline (ZOLOFT) 25 MG tablet    Sig: Take 1 tablet (25 mg total) by mouth daily.    Dispense:  30 tablet    Refill:  3  . methocarbamol (ROBAXIN) 500 MG tablet    Sig: Take 0.5 tablets (250 mg total) by mouth at bedtime.    Dispense:  30 tablet    Refill:  0   No orders of the defined types were placed in this encounter.   Follow up plan: Return in about 4 weeks (around 10/11/2018) for follow up visit.  JaRia BushMD

## 2018-09-12 NOTE — Telephone Encounter (Signed)
Patient's husband called in requesting update on the status of her clonazepam r/x and that he needs refill.

## 2018-09-12 NOTE — Telephone Encounter (Signed)
Notified patient's husband that r/x was sent in yesterday. He is going to pharmacy now to pick up.  Thanks.

## 2018-09-13 ENCOUNTER — Other Ambulatory Visit: Payer: Self-pay | Admitting: Family Medicine

## 2018-09-13 ENCOUNTER — Ambulatory Visit (INDEPENDENT_AMBULATORY_CARE_PROVIDER_SITE_OTHER): Payer: Medicare Other | Admitting: Family Medicine

## 2018-09-13 ENCOUNTER — Encounter: Payer: Self-pay | Admitting: Family Medicine

## 2018-09-13 VITALS — BP 120/84 | HR 60 | Temp 97.6°F | Ht 63.0 in

## 2018-09-13 DIAGNOSIS — R251 Tremor, unspecified: Secondary | ICD-10-CM

## 2018-09-13 DIAGNOSIS — M542 Cervicalgia: Secondary | ICD-10-CM

## 2018-09-13 DIAGNOSIS — G239 Degenerative disease of basal ganglia, unspecified: Secondary | ICD-10-CM

## 2018-09-13 DIAGNOSIS — F331 Major depressive disorder, recurrent, moderate: Secondary | ICD-10-CM

## 2018-09-13 DIAGNOSIS — R209 Unspecified disturbances of skin sensation: Secondary | ICD-10-CM | POA: Diagnosis not present

## 2018-09-13 DIAGNOSIS — G969 Disorder of central nervous system, unspecified: Secondary | ICD-10-CM

## 2018-09-13 DIAGNOSIS — G903 Multi-system degeneration of the autonomic nervous system: Secondary | ICD-10-CM

## 2018-09-13 MED ORDER — METHOCARBAMOL 500 MG PO TABS: 250.0000 mg | ORAL_TABLET | Freq: Every day | ORAL | 0 refills | Status: DC

## 2018-09-13 MED ORDER — SERTRALINE HCL 25 MG PO TABS: 25.0000 mg | ORAL_TABLET | Freq: Every day | ORAL | 3 refills | Status: DC

## 2018-09-13 NOTE — Patient Instructions (Addendum)
Restart sertraline 18m daily in the morning. May take clonazepam (klonopin) medicine in the morning in place of propranolol (inderal) for tremors  May try robaxin muscle relaxant 1/2 tablet at night to see if this may help some.  Continue other medicines.  Return in 1 month for recheck.

## 2018-09-14 ENCOUNTER — Ambulatory Visit (HOSPITAL_COMMUNITY): Payer: Medicare Other

## 2018-09-14 ENCOUNTER — Encounter (HOSPITAL_COMMUNITY): Payer: Medicare Other

## 2018-09-15 ENCOUNTER — Encounter

## 2018-09-15 DIAGNOSIS — M542 Cervicalgia: Secondary | ICD-10-CM | POA: Insufficient documentation

## 2018-09-15 NOTE — Assessment & Plan Note (Signed)
She is on low dose propranolol. Suggested trial off propranolol as recommended by neurology. Will continue klonopin at this time to help tremor and anxiety.

## 2018-09-15 NOTE — Assessment & Plan Note (Signed)
Takes low dose doxepin for this per derm, beneficial.

## 2018-09-15 NOTE — Assessment & Plan Note (Signed)
We all agree to retrial sertraline - will start at 79m daily in am. Will continue trazodone 1039mnightly. RTC 1 mo f/u visit.

## 2018-09-15 NOTE — Assessment & Plan Note (Addendum)
Trial robaxin low dose muscle relaxant. Unsure how helpful this will be.

## 2018-09-19 ENCOUNTER — Telehealth: Payer: Self-pay | Admitting: Psychology

## 2018-09-19 NOTE — Telephone Encounter (Signed)
Telephone call to patient's husband as scheduled.  Patient's husband said that it was not a good time to talk as his wife has had a hard day.  We identified that I would send him all the information that I had on [facility] resources and the atypical parkinsonian support group information.  When he receives I asked him to review them and give me a call.  I included my contact information.

## 2018-09-22 ENCOUNTER — Ambulatory Visit: Payer: Medicare Other | Admitting: Cardiovascular Disease

## 2018-09-27 ENCOUNTER — Ambulatory Visit (HOSPITAL_COMMUNITY): Payer: Medicare Other

## 2018-09-27 ENCOUNTER — Inpatient Hospital Stay (HOSPITAL_COMMUNITY): Admission: RE | Admit: 2018-09-27 | Payer: Medicare Other | Source: Ambulatory Visit

## 2018-09-28 DIAGNOSIS — G903 Multi-system degeneration of the autonomic nervous system: Secondary | ICD-10-CM | POA: Diagnosis not present

## 2018-09-29 ENCOUNTER — Other Ambulatory Visit: Payer: Self-pay | Admitting: Family Medicine

## 2018-09-29 ENCOUNTER — Encounter: Payer: Self-pay | Admitting: Family Medicine

## 2018-09-29 DIAGNOSIS — Z66 Do not resuscitate: Secondary | ICD-10-CM | POA: Insufficient documentation

## 2018-09-29 DIAGNOSIS — Z515 Encounter for palliative care: Secondary | ICD-10-CM | POA: Insufficient documentation

## 2018-09-30 ENCOUNTER — Other Ambulatory Visit: Payer: Self-pay | Admitting: *Deleted

## 2018-09-30 ENCOUNTER — Telehealth: Payer: Self-pay | Admitting: Neurology

## 2018-09-30 NOTE — Telephone Encounter (Signed)
Okay 

## 2018-09-30 NOTE — Telephone Encounter (Signed)
Received noted from [facility] that patient was admitted into Hospice on 09/29/2018.  Dr. Carles Collet Juluis Rainier.

## 2018-09-30 NOTE — Telephone Encounter (Signed)
Name of Medication: Tylenol #3 Name of Pharmacy: [facility] Last Fill or Written Date and Quantity: 01/22/19, #30/0 Last Office Visit and Type: 09/13/18, f/u Next Office Visit and Type: 10/14/18, 1 mo f/u Last Controlled Substance Agreement Date: none Last UDS: none

## 2018-10-02 ENCOUNTER — Other Ambulatory Visit: Payer: Self-pay | Admitting: Family Medicine

## 2018-10-02 MED ORDER — ACETAMINOPHEN-CODEINE #3 300-30 MG PO TABS: 1.0000 | ORAL_TABLET | Freq: Two times a day (BID) | ORAL | 0 refills | Status: DC | PRN

## 2018-10-02 NOTE — Telephone Encounter (Addendum)
E prescribed You put in wrong last fill date.

## 2018-10-03 NOTE — Telephone Encounter (Deleted)
Electronic refill request. Clonazepam Last office visit:

## 2018-10-03 NOTE — Telephone Encounter (Signed)
Name of Medication: Clonazepam Name of Pharmacy: [facility] Last Fill or Written Date and Quantity: 03/10/19, #30/0 Last Office Visit and Type: 09/13/18, f/u Next Office Visit and Type: 10/14/18, 1 mo f/u Last Controlled Substance Agreement Date: none Last UDS: none

## 2018-10-04 NOTE — Telephone Encounter (Signed)
My apologies.

## 2018-10-04 NOTE — Telephone Encounter (Signed)
Eprescribed.

## 2018-10-07 ENCOUNTER — Other Ambulatory Visit: Payer: Self-pay | Admitting: Family Medicine

## 2018-10-10 ENCOUNTER — Other Ambulatory Visit: Payer: Self-pay | Admitting: Family Medicine

## 2018-10-10 NOTE — Telephone Encounter (Signed)
Electronic refill request Propranolol Last office visit 09/13/18 See office notes and confirm if okay to refill Last refill 10/4/169 #180

## 2018-10-11 ENCOUNTER — Telehealth: Payer: Self-pay

## 2018-10-11 NOTE — Telephone Encounter (Signed)
How expensive is myrbetriq? Would prefer myrbetriq if affordable.  Otherwise may send in oxybutynin 53m once daily #30 RF 1 to local pharmacy.

## 2018-10-11 NOTE — Telephone Encounter (Signed)
Hall Busing with [facility] said Edyth Gunnels is not covered by pts ins or by hospice. Pt will be out of med on 10/12/18.[name] wants to know if pt could stop myrbetriq and start oxybutynin 5 mg regular strength taking one tab po daily to [facility]. Hospice does not cover oxybutynin extended release. Pt last seen for FU on 09/13/18; pt has 1 month FU appt on 10/14/18 at 2 PM with Dr Danise Mina.Please advise.

## 2018-10-12 NOTE — Telephone Encounter (Signed)
Left message on vm for [name] [name] of Hospice to call back.

## 2018-10-13 MED ORDER — OXYBUTYNIN CHLORIDE 5 MG PO TABS: 5.0000 mg | ORAL_TABLET | Freq: Every day | ORAL | 1 refills | Status: DC

## 2018-10-13 NOTE — Telephone Encounter (Signed)
[facility] faxed note 10/12/18 at 6:53 that [facility] with Hospice returned call and request cb.

## 2018-10-13 NOTE — Addendum Note (Signed)
Addended by: Brenton Grills on: 47/01/9583 41:71 PM   Modules accepted: Orders

## 2018-10-13 NOTE — Telephone Encounter (Signed)
Spoke with [name] of [facility] relaying Dr. Synthia Innocent message.  States they prefer the oxybutynin 5 mg and if "Hospice patient" can be noted on the rx.   Sent rx to [facility].

## 2018-10-14 ENCOUNTER — Other Ambulatory Visit: Payer: Self-pay | Admitting: Family Medicine

## 2018-10-14 ENCOUNTER — Encounter: Payer: Self-pay | Admitting: Family Medicine

## 2018-10-14 ENCOUNTER — Ambulatory Visit (INDEPENDENT_AMBULATORY_CARE_PROVIDER_SITE_OTHER): Payer: Medicare Other | Admitting: Family Medicine

## 2018-10-14 VITALS — BP 134/80 | HR 89 | Temp 98.2°F | Ht 63.0 in

## 2018-10-14 DIAGNOSIS — N319 Neuromuscular dysfunction of bladder, unspecified: Secondary | ICD-10-CM | POA: Diagnosis not present

## 2018-10-14 DIAGNOSIS — M47812 Spondylosis without myelopathy or radiculopathy, cervical region: Secondary | ICD-10-CM

## 2018-10-14 DIAGNOSIS — E538 Deficiency of other specified B group vitamins: Secondary | ICD-10-CM

## 2018-10-14 DIAGNOSIS — M542 Cervicalgia: Secondary | ICD-10-CM

## 2018-10-14 DIAGNOSIS — K5902 Outlet dysfunction constipation: Secondary | ICD-10-CM

## 2018-10-14 DIAGNOSIS — R531 Weakness: Secondary | ICD-10-CM

## 2018-10-14 DIAGNOSIS — G903 Multi-system degeneration of the autonomic nervous system: Secondary | ICD-10-CM

## 2018-10-14 DIAGNOSIS — G239 Degenerative disease of basal ganglia, unspecified: Secondary | ICD-10-CM

## 2018-10-14 DIAGNOSIS — Z515 Encounter for palliative care: Secondary | ICD-10-CM

## 2018-10-14 DIAGNOSIS — F331 Major depressive disorder, recurrent, moderate: Secondary | ICD-10-CM

## 2018-10-14 DIAGNOSIS — F5104 Psychophysiologic insomnia: Secondary | ICD-10-CM

## 2018-10-14 MED ORDER — ACETAMINOPHEN-CODEINE #3 300-30 MG PO TABS: 1.0000 | ORAL_TABLET | Freq: Two times a day (BID) | ORAL | 0 refills | Status: DC | PRN

## 2018-10-14 MED ORDER — CYANOCOBALAMIN 1000 MCG/ML IJ SOLN
1000.0000 ug | Freq: Once | INTRAMUSCULAR | Status: AC
  Administered 2018-10-14: 1000 ug via INTRAMUSCULAR

## 2018-10-14 NOTE — Patient Instructions (Addendum)
For pain, ok to continue tylenol #3 (codeine) 1 pill nightly, but also take extra strength tylenol 569m nightly.  Take 1 tylenol 5052min the morning as well. Let me know how pain control does with this regimen.  Ok to take whole muscle relaxant robaxin 50065mt night as well.  You can refill 50 tylenol#3 on 10/24/2018 #50.  b12 shot today

## 2018-10-14 NOTE — Progress Notes (Signed)
BP 134/80   Pulse 89   Temp 98.2 F (36.8 C) (Oral)   Ht 5' 3"  (1.6 m)   SpO2 98%   BMI 25.69 kg/m    CC: 1 mo f/u visit Subjective:    Patient ID: [name] [name], female    DOB: 05/02/1941, 77 y.o.   MRN: [id number]  HPI: [name] [name] is a 77 y.o. female presenting on 10/14/2018 for 1 month follow up   See prior note for details. We restarted sertraline 32m, continued trazodone 1027mat night, added robaxin low dose muscle relaxant, suggested stopping propranolol.   Ongoing pain throughout neck, body, arms, legs worse at night. Continued neck pain despite 1/2-1 robaxin nightly.   Recently [facility] [place] changed to miralax + senna 8.89m71m tab bid. Urinary incontinence- starting oxybutynin 5mg57mce daily. [facility] by insurance or hospice.   Relevant past medical, surgical, family and social history reviewed and updated as indicated. Interim medical history since our last visit reviewed. Allergies and medications reviewed and updated. Outpatient Medications Prior to Visit  Medication Sig Dispense Refill  . albuterol (PROAIR HFA) 108 (90 Base) MCG/ACT inhaler TAKE 2 PUFFS BY MOUTH EVERY 6 HOURS AS NEEDED FOR WHEEZE OR SHORTNESS OF BREATH 8.5 Inhaler 0  . aspirin EC 81 MG tablet Take 1 tablet (81 mg total) by mouth daily.    . Cholecalciferol (VITAMIN D3) 1000 units CAPS Take 1 capsule (1,000 Units total) by mouth daily. 30 capsule   . clonazePAM (KLONOPIN) 1 MG tablet TAKE 0.5-1 TABLETS (0.5-1 MG TOTAL) BY MOUTH DAILY AS NEEDED FOR ANXIETY. 30 tablet 0  . cyanocobalamin (,VITAMIN B-12,) 1000 MCG/ML injection Inject 1 mL (1,000 mcg total) into the muscle every 30 (thirty) days. 1 mL 0  . doxepin (SINEQUAN) 10 MG capsule Take 10 mg by mouth at bedtime.    . esMarland Kitchenmeprazole (NEXIUM) 40 MG capsule TAKE 1 CAPSULE BY MOUTH EVERY DAY 90 capsule 0  . famotidine (PEPCID) 40 MG tablet  TAKE 1 TABLET (40 MG TOTAL) BY MOUTH 2 (TWO) TIMES DAILY AS NEEDED FOR HEARTBURN. 180 tablet 3  . furosemide (LASIX) 20 MG tablet TAKE 1 TABLET (20 MG TOTAL) BY MOUTH DAILY AS NEEDED FOR FLUID OR EDEMA. 30 tablet 0  . hyoscyamine (LEVSIN SL) 0.125 MG SL tablet TAKE ONE TABLET DISSOLVED UNDER TONGUE UP TO TWICE A DAY FOR ABDOMINAL OR SIDE PAIN 30 tablet 0  . isosorbide mononitrate (IMDUR) 30 MG 24 hr tablet TAKE 1/2 TABLET BY MOUTH DAILY 15 tablet 9  . levothyroxine (SYNTHROID) 100 MCG tablet Take 1 tablet (100 mcg total) by mouth daily before breakfast. 30 tablet 11  . methocarbamol (ROBAXIN) 500 MG tablet TAKE 0.5 TABLETS (250 MG TOTAL) BY MOUTH AT BEDTIME. 30 tablet 0  . MYRBETRIQ 50 MG TB24 tablet Take 50 mg by mouth daily.  11  . nitroGLYCERIN (NITROSTAT) 0.4 MG SL tablet Place 1 tablet (0.4 mg total) under the tongue every 5 (five) minutes as needed (chest pain). 25 tablet 3  . oxybutynin (DITROPAN) 5 MG tablet Take 1 tablet (5 mg total) by mouth daily. Hospice patient 30 tablet 1  . polyethylene glycol powder (GLYCOLAX/MIRALAX) powder Take 17 g by mouth daily.  1  . propranolol (INDERAL) 10 MG tablet TAKE 1 TABLET BY MOUTH 3 TIMES DAILY AS NEEDED (TREMORS). 90 tablet 0  . rosuvastatin (CRESTOR) 20 MG tablet TAKE 0.5 TABLETS (  10 MG TOTAL) BY MOUTH DAILY. 45 tablet 2  . senna (SENOKOT) 8.6 MG TABS tablet Take 2 tablets (17.2 mg total) by mouth 2 (two) times daily. 120 each 0  . sertraline (ZOLOFT) 25 MG tablet Take 1 tablet (25 mg total) by mouth daily. 30 tablet 3  . traZODone (DESYREL) 100 MG tablet TAKE 1 TABLET BY MOUTH EVERYDAY AT BEDTIME 90 tablet 0  . trimethoprim (TRIMPEX) 100 MG tablet Take 100 mg by mouth daily.  11  . acetaminophen-codeine (TYLENOL #3) 300-30 MG tablet Take 1 tablet by mouth 2 (two) times daily as needed for moderate pain. 30 tablet 0  . bisacodyl (DULCOLAX) 10 MG suppository Place 10 mg rectally as needed for mild constipation or moderate constipation.    . docusate  sodium (COLACE) 100 MG capsule Take 300 mg by mouth daily.      No facility-administered medications prior to visit.      Per HPI unless specifically indicated in ROS section below Review of Systems     Objective:    BP 134/80   Pulse 89   Temp 98.2 F (36.8 C) (Oral)   Ht 5' 3"  (1.6 m)   SpO2 98%   BMI 25.69 kg/m   Wt Readings from Last 3 Encounters:  06/14/17 145 lb (65.8 kg)  04/14/17 145 lb (65.8 kg)  03/07/17 145 lb (65.8 kg)    Physical Exam  Constitutional: She appears well-developed and well-nourished. No distress.  Today in wheelchair, with freer movement, more understandable speech  HENT:  Mouth/Throat: Oropharynx is clear and moist. No oropharyngeal exudate.  Neck: Normal range of motion. Neck supple.  No midline cervical neck or paraspinous or trapezius mm pain to palpation  Cardiovascular: Normal rate, regular rhythm and normal heart sounds.  No murmur heard. Pulmonary/Chest: Effort normal and breath sounds normal. No respiratory distress. She has no wheezes. She has no rales.  Musculoskeletal: She exhibits no edema.  Psychiatric: She has a normal mood and affect.  Nursing note and vitals reviewed.  Lab Results  Component Value Date   TSH 0.44 06/01/2018       Assessment & Plan:   Problem List Items Addressed This Visit    Vitamin B12 deficiency    b12 shot today.       Relevant Medications   cyanocobalamin ((VITAMIN B-12)) injection 1,000 mcg (Completed)   Osteoarthritis of cervical spine    Anticipate cause of ongoing neck pain.  Reviewed new recommendations for pain regimen -  1. ES tylenol 515m in AM 2. ES tylenol 5062mat bedtime 3. tylenol #3 at bedtime 4. Robaxin 50031mt bedtime 5. Extra ES tylenol or tylenol #3 during day PRN.  Update with effect.       Relevant Medications   acetaminophen-codeine (TYLENOL #3) 300-30 MG tablet   Neurogenic bladder    myrbetriq no longer covered by insurance. They will trial oxybutynin 5mg76mr  urinary incontinence, if ineffective, may return to myrbetriq.       Neck pain    See above. No reproducible neck pain to palpation today.       Multiple [facility] [place] ([facility]    Established with Dr Tat. Hospice at home involved, they have new hospital bed and wheelchair and are happy with this. Appreciate care of all involved.       MDD (major depressive disorder)    Continue sertraline 25mg60mly. Continue trazodone 100mg 34mtly.       Hospice care patient  Appreciate hospice care. Husband caregiver feels better supported      General weakness   Constipation    Doing better with current bowel regimen of miralax and senna daily.       Chronic insomnia    Continue trazodone 142m nightly. Melatonin was ineffective.           Meds ordered this encounter  Medications  . acetaminophen-codeine (TYLENOL #3) 300-30 MG tablet    Sig: Take 1 tablet by mouth 2 (two) times daily as needed for moderate pain.    Dispense:  50 tablet    Refill:  0    Fill after 10/24/2018  . cyanocobalamin ((VITAMIN B-12)) injection 1,000 mcg   No orders of the defined types were placed in this encounter.   Follow up plan: Return in about 1 month (around 11/14/2018) for follow up visit.  JRia Bush MD

## 2018-10-15 NOTE — Assessment & Plan Note (Signed)
myrbetriq no longer covered by insurance. They will trial oxybutynin 45m for urinary incontinence, if ineffective, may return to myrbetriq.

## 2018-10-15 NOTE — Assessment & Plan Note (Addendum)
Continue trazodone 122m nightly. Melatonin was ineffective.

## 2018-10-15 NOTE — Assessment & Plan Note (Signed)
b12 shot today.

## 2018-10-15 NOTE — Assessment & Plan Note (Signed)
Anticipate cause of ongoing neck pain.  Reviewed new recommendations for pain regimen -  1. ES tylenol 582m in AM 2. ES tylenol 5032mat bedtime 3. tylenol #3 at bedtime 4. Robaxin 50043mt bedtime 5. Extra ES tylenol or tylenol #3 during day PRN.  Update with effect.

## 2018-10-15 NOTE — Assessment & Plan Note (Signed)
Appreciate hospice care. Husband caregiver feels better supported

## 2018-10-15 NOTE — Assessment & Plan Note (Signed)
Doing better with current bowel regimen of miralax and senna daily.

## 2018-10-15 NOTE — Assessment & Plan Note (Signed)
See above. No reproducible neck pain to palpation today.

## 2018-10-15 NOTE — Assessment & Plan Note (Signed)
Established with Dr Tat. Hospice at home involved, they have new hospital bed and wheelchair and are happy with this. Appreciate care of all involved.

## 2018-10-15 NOTE — Assessment & Plan Note (Signed)
Continue sertraline 63m daily. Continue trazodone 1062mnightly.

## 2018-10-28 ENCOUNTER — Other Ambulatory Visit: Payer: Self-pay

## 2018-10-28 MED ORDER — METHOCARBAMOL 500 MG PO TABS: 500.0000 mg | ORAL_TABLET | Freq: Every day | ORAL | 0 refills | Status: DC

## 2018-10-28 NOTE — Telephone Encounter (Signed)
Sent in

## 2018-10-28 NOTE — Telephone Encounter (Signed)
[name] nurse with [facility] said when pt was seen 10/14/18 pt was advised OK to take Robaxin 500 mg at hs. On med list still has Robaxin 250 mg at hs. [name] said pt has 6 more pills but request new rx for Robaxin 500 mg with instructions to take 500 mg at hs. [name] said only send in 15 day supply due to new hospice ins guidelines.[facility] [employer]

## 2018-10-31 ENCOUNTER — Other Ambulatory Visit: Payer: Self-pay | Admitting: Family Medicine

## 2018-10-31 NOTE — Telephone Encounter (Signed)
Name of Medication: Clonazepam Name of Pharmacy: [facility] Last Fill or Written Date and Quantity: 10/09/18, #30/0 Last Office Visit and Type: 10/14/18, f/u Next Office Visit and Type: 11/14/18, 1 mo f/u Last Controlled Substance Agreement Date: none Last UDS: none

## 2018-10-31 NOTE — Telephone Encounter (Signed)
Name of Medication: Clonazepam 1 mg Name of Pharmacy: [facility] or Written Date and Quantity: # 30 on 10/04/18 Last Office Visit and Type:10/14/18 FU 1 mth  Next Office Visit and Type:11/14/18 FU 1 mth

## 2018-11-01 NOTE — Telephone Encounter (Signed)
Eprescribed.

## 2018-11-04 ENCOUNTER — Other Ambulatory Visit: Payer: Self-pay | Admitting: Family Medicine

## 2018-11-04 ENCOUNTER — Telehealth: Payer: Self-pay | Admitting: *Deleted

## 2018-11-04 MED ORDER — HYDROCODONE-ACETAMINOPHEN 5-325 MG PO TABS: 1.0000 | ORAL_TABLET | Freq: Two times a day (BID) | ORAL | 0 refills | Status: DC | PRN

## 2018-11-04 NOTE — Telephone Encounter (Signed)
Current pain regimen: 1. ES tylenol 569m in AM 2. ES tylenol 5025mat bedtime 3. Tylenol #3 at bedtime 4. Robaxin 50014mt bedtime 5. Extra ES tylenol or tylenol #3 during day PRN.   Current sleep regimen: 1. Trazodone 100m76m night 2. Doxepin 10mg43mnight  Also takes Klonopin 1mg i29mm  Rec try salon pas patch at night for pain.  We will change from tylenol #3 to hydrocodone 5/325mg n35mly with 2nd 1/2-1 tablet as needed.  TheresaClarene Critchleyouch base with husband about these changes.

## 2018-11-04 NOTE — Telephone Encounter (Signed)
Spoke to [place] with hospice, who states pt has an upcoming appt 1/6. She is very concerned that the pt is not sleeping well at night, and c/o of being in pain. She is currently trazodone and tylenol #3, with no effectiveness, and she is wanting to know if there is anything additional Dr Darnell Level would like to do. She is requesting a call before the end of day, so that the pt does not have to go the entire weekend. pls advise

## 2018-11-05 ENCOUNTER — Other Ambulatory Visit: Payer: Self-pay | Admitting: Family Medicine

## 2018-11-07 NOTE — Telephone Encounter (Signed)
Pharmacy is requesting #270/1 refill for pt.  Is this ok to fill?

## 2018-11-08 NOTE — Telephone Encounter (Signed)
1 month supply sent in - we have discussed tapering off to see if tolerated.

## 2018-11-08 NOTE — Telephone Encounter (Signed)
plz phone in due to E prescribing error.

## 2018-11-10 ENCOUNTER — Telehealth: Payer: Self-pay | Admitting: Family Medicine

## 2018-11-10 MED ORDER — PROPRANOLOL HCL 10 MG PO TABS: ORAL_TABLET | ORAL | 1 refills | Status: AC

## 2018-11-10 NOTE — Telephone Encounter (Signed)
Hospice nurse had also called their on call doc since she had not heard from [place] after calling this am. Their doc told her to put a foley cath in and if she gets greater then 100cc if urine to keep it in for now, nurse said when she cath pt she instantly got over 400cc of urine so she kept it in. While the nurse was there pt wanted to get from her bed to her recliner and as soon as they got pt up she gave another 400 cc of urine so hospice nurse kept the foley cath in for now. She said she will stop the oxybutynin since pt has a cath, but didn't know if she still needed to cut pt's hydrocodone med in 1/2 since pt has a cath to help with that issue. Nurse said for today she will keep the same dose as she usually gets until she hears back from Dr. Darnell Level tomorrow. Also nurse said she will go back out tomorrow to check on pt and will update [place] on how she's doing with the cath

## 2018-11-10 NOTE — Telephone Encounter (Signed)
Let's stop oxybutynin, decrease hydrocodone to 1/2 tab at night and see if easier urination with this. Update me tomorrow with effect.

## 2018-11-10 NOTE — Telephone Encounter (Signed)
[name] [name] of [facility] called office stating that since the pt starting the hydrocodone the pt has not urinated since yesterday morning. She stated the pt has a history of a over active bladder. She wants to know what Dr.Gutierrez wants to do.

## 2018-11-10 NOTE — Addendum Note (Signed)
Addended by: Tammi Sou on: 11/10/2018 09:39 AM   Modules accepted: Orders

## 2018-11-11 ENCOUNTER — Telehealth: Payer: Self-pay | Admitting: *Deleted

## 2018-11-11 DIAGNOSIS — E7800 Pure hypercholesterolemia, unspecified: Secondary | ICD-10-CM

## 2018-11-11 NOTE — Telephone Encounter (Signed)
Spoke to [employer] who is wanting orders to d/c crestor. She has d/c ditropan, and had last dose today, as she now has a catheter. She also has a Rx for Klonopin and states that pts husband has been given her 29m q am, and then again during the day is pt becomes aggravated. THelene Kelpis wanting to know if Dr GDarnell Levelwill change instruction or if pt should be taking as directed. pls advise

## 2018-11-11 NOTE — Telephone Encounter (Signed)
Spoke with [name], of [facility], relaying Dr. Synthia Innocent message.  Verbalizes understanding.

## 2018-11-11 NOTE — Telephone Encounter (Signed)
Ok to continue same dose of hydrocodone at this time.

## 2018-11-11 NOTE — Telephone Encounter (Signed)
Ok to d/c crestor.  Ok to change klonopin to 78m tab BID PRN anxiety. Let [place]now when refill next needed.

## 2018-11-11 NOTE — Telephone Encounter (Signed)
Spoke with [facility] relaying Dr. Synthia Innocent instructions.  She verbalizes understanding but asks that a refill be sent to the pharmacy. Pt only has a few tabs left.

## 2018-11-14 ENCOUNTER — Ambulatory Visit: Admitting: Family Medicine

## 2018-11-14 MED ORDER — CLONAZEPAM 1 MG PO TABS: 1.0000 mg | ORAL_TABLET | Freq: Two times a day (BID) | ORAL | 0 refills | Status: DC | PRN

## 2018-11-14 NOTE — Telephone Encounter (Signed)
Refilled today

## 2018-11-14 NOTE — Addendum Note (Signed)
Addended by: Ria Bush on: 11/14/2018 08:04 AM   Modules accepted: Orders

## 2018-11-16 ENCOUNTER — Other Ambulatory Visit: Payer: Self-pay | Admitting: Family Medicine

## 2018-11-16 NOTE — Telephone Encounter (Signed)
Methocarbamol Last filled:  10/31/18, #15/0 Last OV:  10/14/18, f/u Next OV:  12/21/18, 1 mo f/u

## 2018-11-17 ENCOUNTER — Other Ambulatory Visit: Payer: Self-pay | Admitting: Family Medicine

## 2018-11-17 ENCOUNTER — Telehealth: Payer: Self-pay

## 2018-11-17 MED ORDER — SERTRALINE HCL 50 MG PO TABS: 50.0000 mg | ORAL_TABLET | Freq: Every day | ORAL | 6 refills | Status: DC

## 2018-11-17 MED ORDER — HYDROCODONE-ACETAMINOPHEN 5-325 MG PO TABS: 1.0000 | ORAL_TABLET | Freq: Two times a day (BID) | ORAL | 0 refills | Status: DC | PRN

## 2018-11-17 MED ORDER — METHOCARBAMOL 500 MG PO TABS: 500.0000 mg | ORAL_TABLET | Freq: Every day | ORAL | 3 refills | Status: DC

## 2018-11-17 NOTE — Telephone Encounter (Signed)
Hospice pt Oxybutynin Last filled:  11/07/18, #15/3 Last OV:  10/14/18, f/u Next OV:  12/21/18, 1 mo f/u
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
## 2018-11-17 NOTE — Telephone Encounter (Signed)
pts husband came by since refills not at [place] yet. Advised Dr Darnell Level is still with pts and will send in when finishes morning pts. pts husband will [facility] this afternoon. I also went over med changes with pt's husband and he voiced total understanding. [name] the hospice nurse had written down the med changes and the meds that were to be stopped. FYI to Dr Darnell Level.

## 2018-11-17 NOTE — Telephone Encounter (Signed)
meds refilled 

## 2018-11-23 ENCOUNTER — Ambulatory Visit: Payer: Self-pay | Admitting: Neurology

## 2018-11-23 NOTE — Telephone Encounter (Signed)
Hospice nurse calls in to discuss the hydrocodone R/x.  She is taking 2 every day scheduled.  Nurse would like to get approved by their care team to be able to obtain a 30 day r/x for this rather than a 15 day.    Hospice nurse is managing patients pill box now due to husband being overwhelmed and needing assistance.    Hospice nurse letting us know that she is going to consult with their care management team on best ways to get her hydrocodone managed moving forward.  She will let us know tomorrow what her requests are for future hydrocodone fills to better meet patients needs.

## 2018-11-24 ENCOUNTER — Other Ambulatory Visit: Payer: Self-pay | Admitting: Family Medicine

## 2018-11-25 NOTE — Addendum Note (Signed)
Addended by: Ria Bush on: 11/25/2018 05:46 PM   Modules accepted: Orders

## 2018-11-25 NOTE — Telephone Encounter (Signed)
[name] (hospice nurse) calls to provide an update on patient after speaking with their Hospice care team and provider.  They have made the following medication changes due to significantly increased agitation, behaviors and insomnia:    1.  Stopped the Clonazepam  2.  Started Lorazepam 37m q6hrs prn (they are giving it qam and qpm right now on a more scheduled basis) 3.  Melatonin 313mqhs  (I have updated patient medication list to represent these changes).    Patient has been doing better over the past 48hours and is sleeping through the night.  Today she has been a little more giddy and euphoric but overall much better.  Hospice nurse also wants to flag for PCP that patient has a significant family history of bipolar.    In addition, they were able to get approval for Dr. G Darnell Levelo start prescribing a 30 day r/x for her hydrocodone management.  She currently does not need a refill but nurse will call back next week when it is due.    Patient has an appointment with her cardiologist on 1/27 but due to her behaviors and decline, nurse does not feel like it will be appropriate to transfer her to an in-office appointment.  She would like to know if PCP feels like continuing with the cardiologist is necessary at this point?  Please advise.

## 2018-11-25 NOTE — Telephone Encounter (Addendum)
Thank you for the update. Agree with cancelling cardiology appt at this time.

## 2018-11-28 ENCOUNTER — Telehealth: Payer: Self-pay | Admitting: Neurology

## 2018-11-28 NOTE — Telephone Encounter (Signed)
Spoke with Hospice Nurse [name] and updated her on Dr. Synthia Innocent comments regarding cardiology appointment. She thanks [place] for the follow up.

## 2018-11-28 NOTE — Telephone Encounter (Signed)
Husband is calling in just to let yall know she is not doing the swallowing test because he has called Hospice to come in 2xs a week. She is just not in the shape to complete the testing. Just FYI! Thanks!

## 2018-11-28 NOTE — Telephone Encounter (Signed)
Dr. Carles Collet Juluis Rainier.

## 2018-11-29 ENCOUNTER — Other Ambulatory Visit: Payer: Self-pay

## 2018-11-29 NOTE — Telephone Encounter (Signed)
  Name of Medication: robaxin 500 mg Name of Pharmacy: [facility] or Written Date and Quantity: # 15 x 3 on 11/17/18 Last Office Visit and Type: 10/14/18 1 mth FU Next Office Visit and Type: 12/21/18 1 mth FU Last Controlled Substance Agreement Date: none Last VAN:VBTY  Name of [place] apap [place] Name of Pharmacy: [facility] or Written Date and Quantity: # 25 on 11/17/18 Last Office Visit and Type: 10/14/18 1 mth FU Next Office Visit and Type: 12/21/18 1 mth FU Last Controlled Substance Agreement Date: none Last OMA:YOKH  [name] [name] with [facility] left v/m requesting 30 day supply on each; has gotten an override for 30 day rxs. [name] request cb when refilled.

## 2018-11-30 MED ORDER — HYDROCODONE-ACETAMINOPHEN 5-325 MG PO TABS: 1.0000 | ORAL_TABLET | Freq: Two times a day (BID) | ORAL | 0 refills | Status: AC | PRN

## 2018-11-30 MED ORDER — METHOCARBAMOL 500 MG PO TABS: 500.0000 mg | ORAL_TABLET | Freq: Every day | ORAL | 3 refills | Status: AC

## 2018-11-30 NOTE — Telephone Encounter (Signed)
Patient's son and [name] with Hospice notified by telephone that script has been sent to the pharmacy. [name] stated that he has already picked the medication up.

## 2018-11-30 NOTE — Telephone Encounter (Signed)
plz notify Rx sent in.

## 2018-11-30 NOTE — Telephone Encounter (Signed)
Mr Hoyos (DPR signed) said pt has on occasion spit up the robaxin and hydrocodone and so pt is running low on med. Hospice nurse is coming back out to the home on 12/01/18 to fix med boxes for pt.Mr Haecker request refills done on 11/30/18.Please advise.

## 2018-12-01 ENCOUNTER — Telehealth: Payer: Self-pay

## 2018-12-01 NOTE — Telephone Encounter (Signed)
Hall Busing with [facility] said pt has been taking Lorazepam,melatonin, increased dosages of  trazodone and zoloft as prescribed. Pt did fine for few days. Now pt is staying up all night, not eating, rambling, accusing people of being mean, and talking out of her head at times. On 11/29/18 pt vomited after taking meds but regave meds with applesauce and pt kept meds down. On 11/30/18 pt vomited up a lot of mucus and pt did not get any of the above meds. [name] request cb with what to do. [facility]. Dr Danise Mina out of office; will send note to. Dr Damita Dunnings who is in office.

## 2018-12-01 NOTE — Telephone Encounter (Signed)
Can you please try to get some extra information on this?  Some of the troubles may be worse after missing a dose of meds due to vomiting.  Does she have fever? abd pain? Dysuria?  Coughing?   What other sx?  Please let me know.  Thanks.

## 2018-12-01 NOTE — Telephone Encounter (Signed)
Spoke w [name] of hospice. She prepares pt's pill box. Mood swings noted. Will decrease sertraline back to 72m.  She continues trazodone 1568mnightly.  Will check UA and reflex to culture if abnormal.   We may need to start low dose seroquel to replace trazodone if not doing better with above and UA unrevealing.

## 2018-12-01 NOTE — Telephone Encounter (Signed)
Hospice nurse [name]) says patient has no fever, no abdominal pain, supposedly no dysuria (has catheter), no coughing or other symptoms.  [name] says there is supposedly a history of bipolar in a daughter and patient's mother was institutionalized twice for mental breakdown.  Husband is absolutely exhausted.

## 2018-12-05 ENCOUNTER — Ambulatory Visit: Payer: Medicare Other | Admitting: Cardiovascular Disease

## 2018-12-06 MED ORDER — SULFAMETHOXAZOLE-TRIMETHOPRIM 800-160 MG PO TABS: 1.0000 | ORAL_TABLET | Freq: Two times a day (BID) | ORAL | 0 refills | Status: DC

## 2018-12-06 NOTE — Telephone Encounter (Signed)
Spoke with [name], of [facility], relaying Dr. Synthia Innocent message and instructions. She verbalizes understanding.

## 2018-12-06 NOTE — Addendum Note (Signed)
Addended by: Ria Bush on: 12/06/2018 01:38 PM   Modules accepted: Orders

## 2018-12-06 NOTE — Telephone Encounter (Addendum)
Received micro showing TNTC WBC, 11-30 RBC, UCx showing >100k GNRs.  Let's start treatment with bactrim abx sent to pharmacy 7 d course.  Will await final UCx read.  Also would recommend change foley towards end of antibiotic treatment.

## 2018-12-06 NOTE — Telephone Encounter (Signed)
[place] is going to see pt today around 11:30; [name] wants to know if Dr Darnell Level received U/A and CS faxed report; [name] said U/A had WBC > 30,bacteria moderate and crystals present. CS had gram negative rods > 100,000. Does not see sensitivity report. Lattie Haw did you get faxed report; do not see scanned under media. [name] was not sure when would have been faxed. [name] request cb prior to seeing pt this morning.

## 2018-12-07 ENCOUNTER — Telehealth: Payer: Self-pay | Admitting: *Deleted

## 2018-12-07 NOTE — Telephone Encounter (Signed)
[name] with Hospice called stating that patient is up for re certification  for their care. [name] requested a call back with verbal orders to continue their care.

## 2018-12-07 NOTE — Telephone Encounter (Signed)
Spoke with [name], of Hospice, informing her Dr. Darnell Level is giving verbal orders to continue care.  Verbalizes understanding.

## 2018-12-07 NOTE — Telephone Encounter (Signed)
Agree with this. Thanks.  

## 2018-12-08 MED ORDER — AMOXICILLIN-POT CLAVULANATE 875-125 MG PO TABS: 1.0000 | ORAL_TABLET | Freq: Two times a day (BID) | ORAL | 0 refills | Status: AC

## 2018-12-08 NOTE — Telephone Encounter (Signed)
plz call [name] - UCx returned growing bacteria resistant to bactrim. Stop bactrim, start augmentin in its place.

## 2018-12-08 NOTE — Addendum Note (Signed)
Addended by: Ria Bush on: 12/08/2018 02:25 PM   Modules accepted: Orders

## 2018-12-08 NOTE — Telephone Encounter (Signed)
Spoke with [name] [name], relaying Dr. Synthia Innocent message. Teresa verbalizes understanding.  [name] [name] states Dr. Duanne Guess did recertification visit and states they are going to try some changes: - Increasing Robaxin 500 mg to BID due to increased    muscle spasms - Stopping hydrocodone-APAP.  Will do oxycodone 10,    1 tab q4h PRN - Dr. Tomasa Hosteller wanted pt taking lorazepam QID.    Instead, pt will take TID (8:00 AM, 3:00 PM, 8:00 PM),    with an additional dose during the night if pt wakes up    anxious

## 2018-12-09 NOTE — Telephone Encounter (Signed)
Noted  

## 2018-12-21 ENCOUNTER — Ambulatory Visit: Admitting: Family Medicine

## 2019-01-03 ENCOUNTER — Other Ambulatory Visit: Payer: Self-pay | Admitting: Family Medicine

## 2019-01-16 ENCOUNTER — Telehealth: Payer: Self-pay | Admitting: Family Medicine

## 2019-01-16 NOTE — Telephone Encounter (Signed)
Received death certificate to fill out. Called husband, expressed my condolences.

## 2019-02-08 DEATH — deceased

## 2019-02-09 ENCOUNTER — Telehealth: Payer: Self-pay | Admitting: Family Medicine

## 2019-02-09 NOTE — Telephone Encounter (Signed)
I left msg for [name] [name] to refax to [phone] No ppw received by Dr Danise Mina   Copied from [place]. Topic: General - Inquiry >> Feb 08, 2019  4:10 PM Vernona Rieger wrote: Reason for CRM: [name] [name] with Hospice called about orders that she has faxed to the office to be signed by Dr Darnell Level. Her call back is [phone]

## 2019-02-13 NOTE — Telephone Encounter (Signed)
Wanting to know status of paper work that was fax and need to be signed and sent back.

## 2019-02-14 ENCOUNTER — Ambulatory Visit: Payer: Medicare Other | Admitting: Neurology

## 2019-02-14 NOTE — Telephone Encounter (Signed)
Faxed paperwork

## 2019-03-01 IMAGING — CT CT ABD-PELV W/ CM
2 of 5 series · 16 of 46 positions shown, 18 images · IV contrast (Omni 300)
Comparison: CT 10/08/2015

CLINICAL DATA: Abdominal pain.

EXAM:
CT ABDOMEN AND PELVIS WITH CONTRAST
TECHNIQUE: Multidetector CT imaging of the abdomen and pelvis was performed
using the standard protocol following bolus administration of
intravenous contrast.
CONTRAST:  100mL OMNIPAQUE IOHEXOL 300 MG/ML  SOLN

[Series 3: a/p w/ 5mm · axial · 0.89mm/px · z∈[+695,+1055]mm · 13 of 81 slices shown, 15 images]
[im 5/81  soft-tissue]
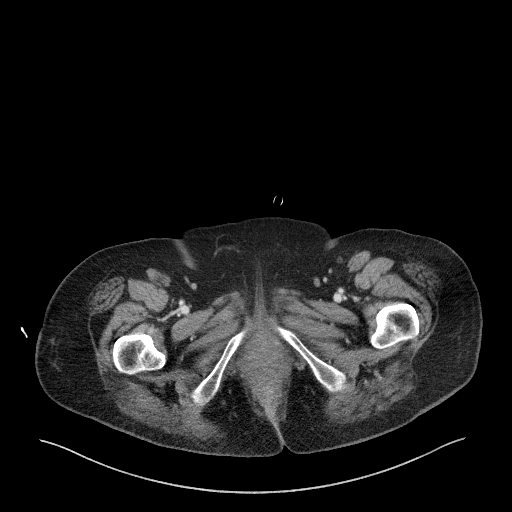
[im 5/81  bone]
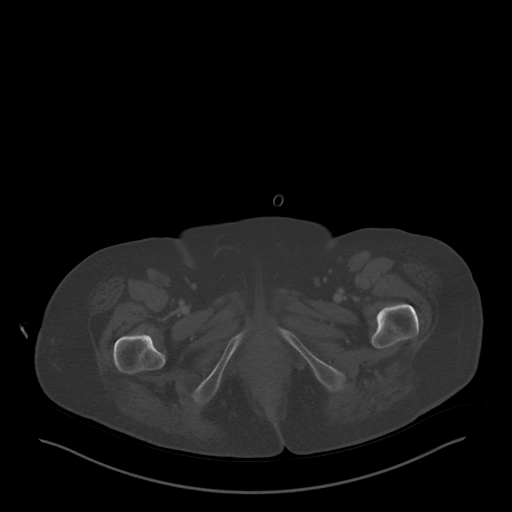
[im 13/81  soft-tissue]
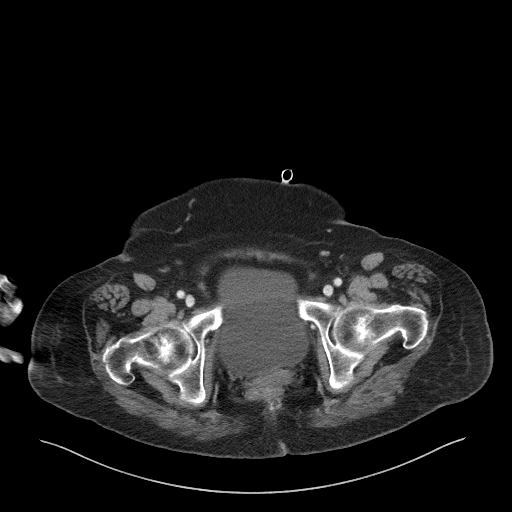
[im 17/81  soft-tissue]
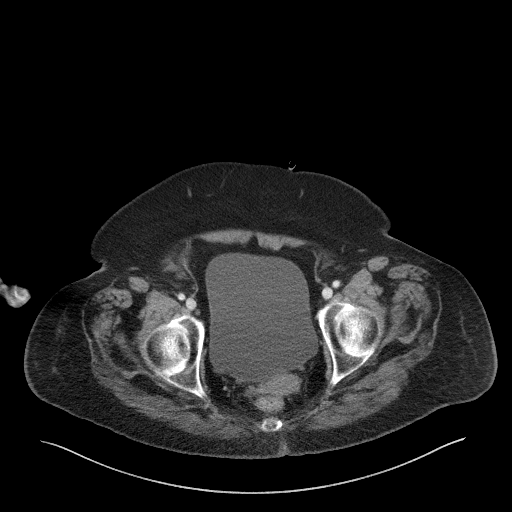
[im 25/81  soft-tissue]
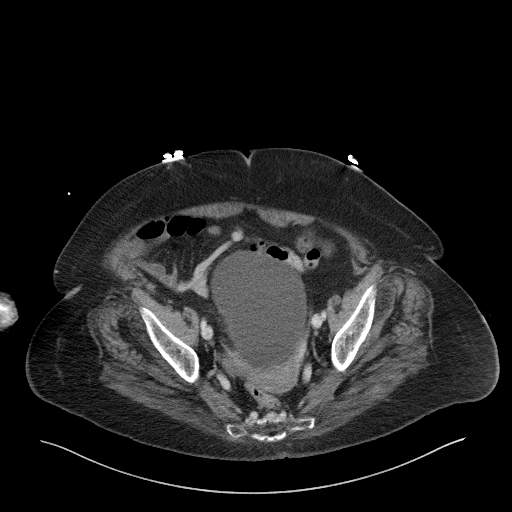
[im 29/81  soft-tissue]
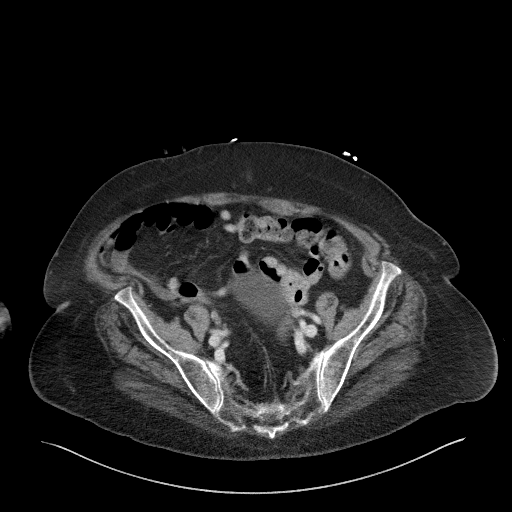
[im 37/81  soft-tissue]
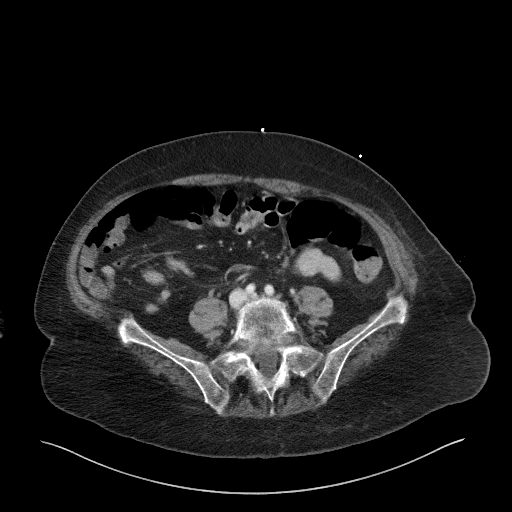
[im 41/81  soft-tissue]
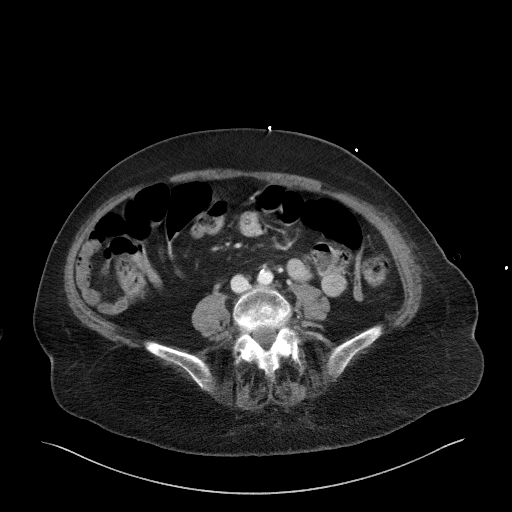
[im 45/81  soft-tissue]
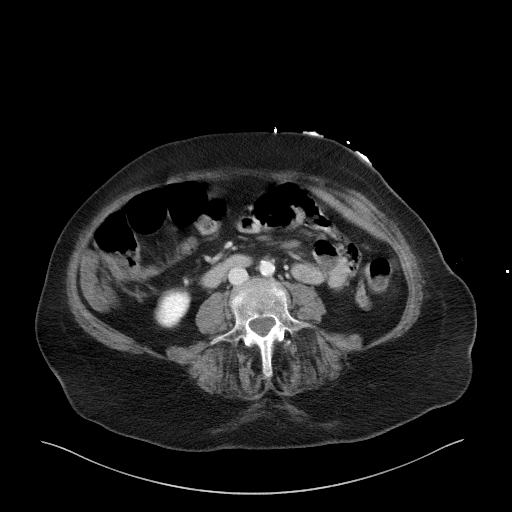
[im 53/81  soft-tissue]
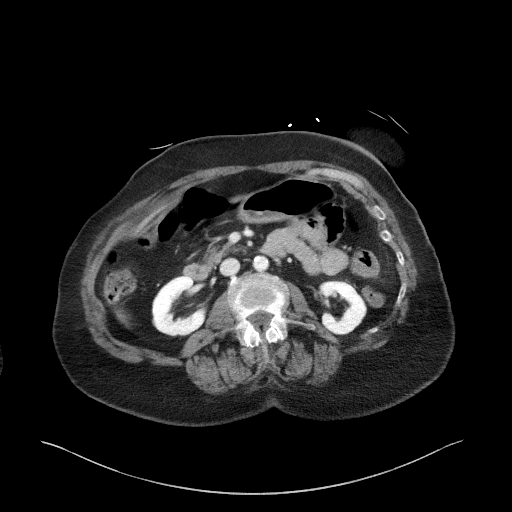
[im 53/81  bone]
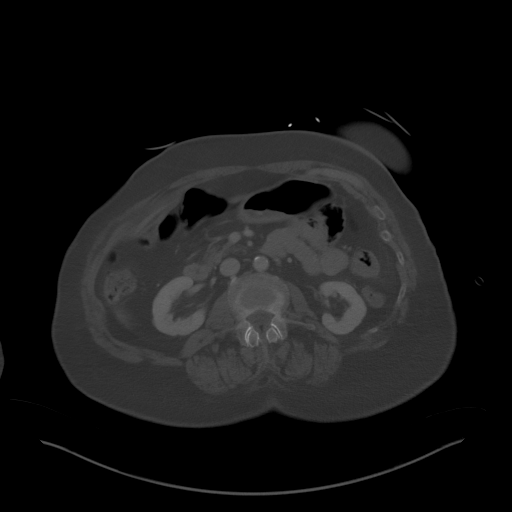
[im 57/81  soft-tissue]
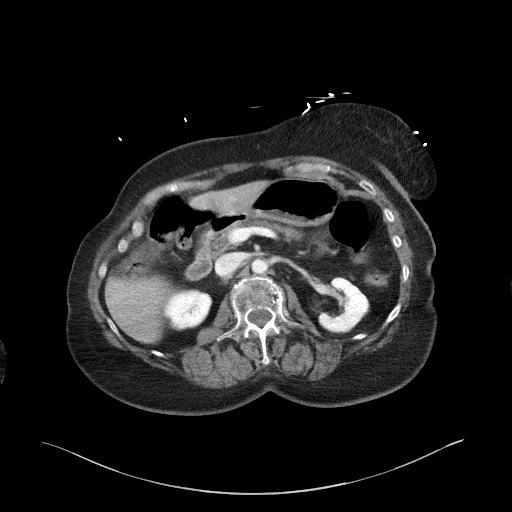
[im 65/81  soft-tissue]
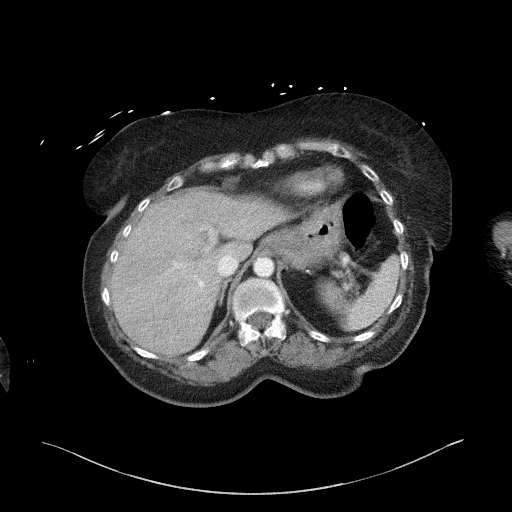
[im 69/81  soft-tissue]
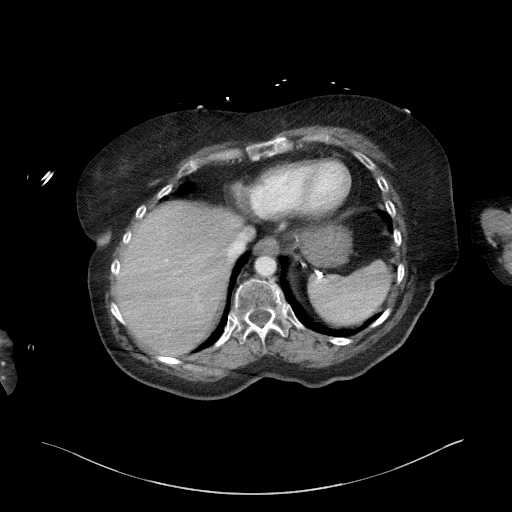
[im 77/81  soft-tissue]
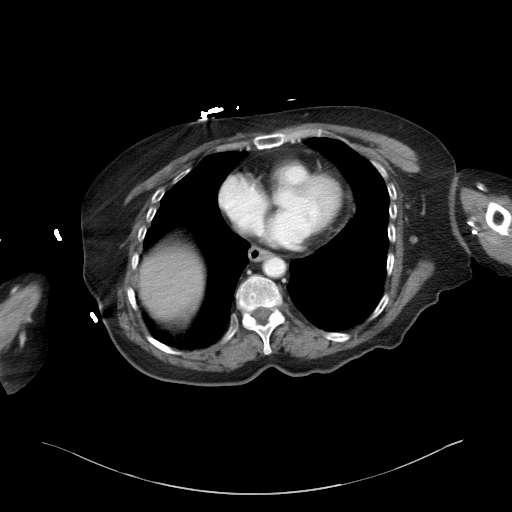

[Series 6: a/p w/ cor · coronal · 0.77mm/px · 3 of 149 slices shown]
[im 50/149  soft-tissue]
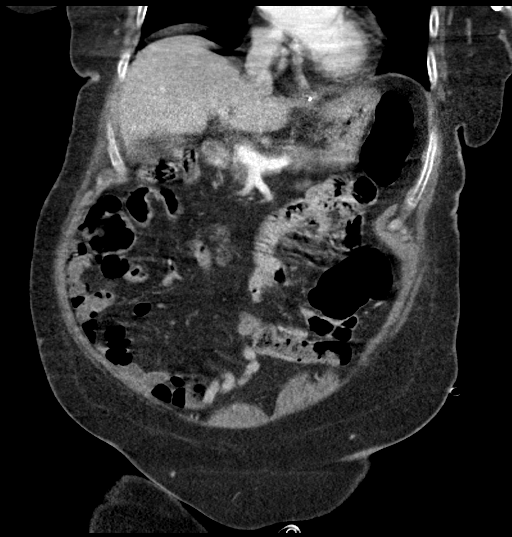
[im 66/149  soft-tissue]
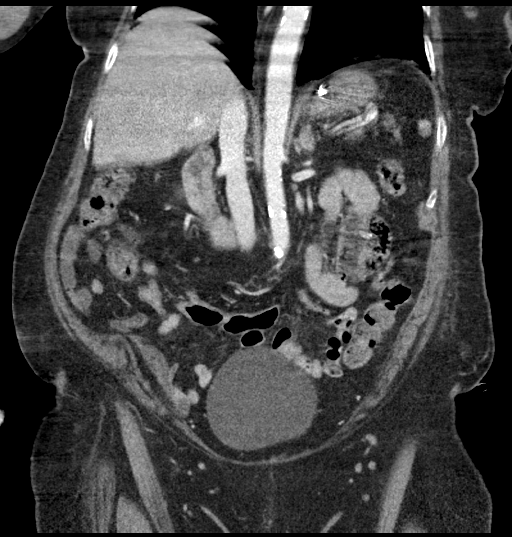
[im 83/149  soft-tissue]
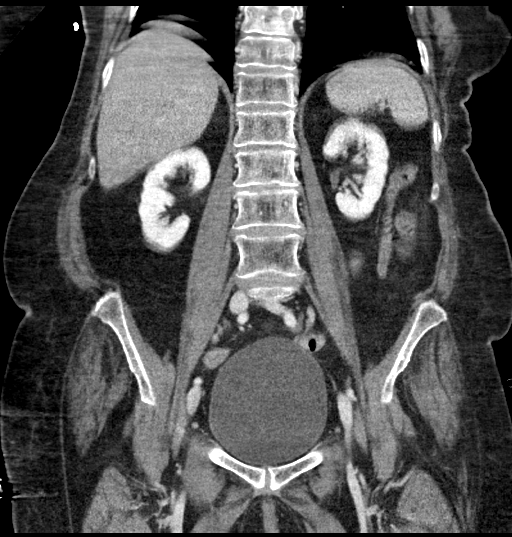

[16 of 46 positions shown; findings below may reference images not displayed]

FINDINGS: Lower chest: Breathing motion artifact in the lung bases limits
assessment. No large pleural effusion.

Hepatobiliary: Mild motion artifact through the liver and
gallbladder on both portal venous and delayed phase. Allowing for
this no acute abnormality or focal hepatic lesion. No calcified
gallstone or biliary dilatation.

Pancreas: Parenchymal atrophy. No ductal dilatation or inflammation.

Spleen: Normal in size without focal abnormality. Small splenule
anteriorly.

Adrenals/Urinary Tract: Normal adrenal glands. No hydronephrosis or
perinephric edema. Homogeneous renal enhancement with symmetric
excretion on delayed phase imaging. Small simple cyst in the lower
left kidney. Urinary bladder is distended without wall thickening.
Bladder volume = 610 cm^3.

Stomach/Bowel: Motion artifact and lack of enteric contrast
partially limits bowel assessment. Surgical clips at the
gastroesophageal junction. No gastric wall thickening. No bowel
obstruction. There is colonic tortuosity with short segment mild
wall thickening of the splenic flexure and proximal descending colon
with faint adjacent pericolonic stranding. No significant
diverticular disease. Appendix not confidently visualized.

Vascular/Lymphatic: Aortic atherosclerosis without aneurysm. No
adenopathy, motion partially limits evaluation of the upper abdomen.

Reproductive: Uterus and bilateral adnexa are unremarkable.

Other: No free air, free fluid, or intra-abdominal fluid collection.

Musculoskeletal: The bones are under mineralized. There are no acute
or suspicious osseous abnormalities.
IMPRESSION: 1. Short segment colonic wall thickening with pericolonic edema
about the splenic flexure and proximal descending colon, suggesting
colitis. This is likely infectious or inflammatory.
2. Bladder distention without wall thickening.
3. Incidental Aortic Atherosclerosis ([id number]-Y15.5).

## 2019-06-06 ENCOUNTER — Ambulatory Visit: Payer: Medicare Other

## 2019-06-16 ENCOUNTER — Encounter: Payer: Medicare Other | Admitting: Family Medicine
